# Patient Record
Sex: Female | Born: 1960 | Race: White | Hispanic: No | Smoking: Former smoker
Health system: Southern US, Community
[De-identification: ages and names within clinical notes are randomized; demographics above are authoritative.]

## PROBLEM LIST (undated history)

## (undated) DIAGNOSIS — Z8719 Personal history of other diseases of the digestive system: Secondary | ICD-10-CM

## (undated) DIAGNOSIS — K8591 Acute pancreatitis with uninfected necrosis, unspecified: Secondary | ICD-10-CM

## (undated) DIAGNOSIS — K859 Acute pancreatitis without necrosis or infection, unspecified: Secondary | ICD-10-CM

## (undated) DIAGNOSIS — K219 Gastro-esophageal reflux disease without esophagitis: Secondary | ICD-10-CM

## (undated) DIAGNOSIS — J45909 Unspecified asthma, uncomplicated: Secondary | ICD-10-CM

## (undated) DIAGNOSIS — R51 Headache: Secondary | ICD-10-CM

## (undated) DIAGNOSIS — J189 Pneumonia, unspecified organism: Secondary | ICD-10-CM

## (undated) DIAGNOSIS — R519 Headache, unspecified: Secondary | ICD-10-CM

## (undated) DIAGNOSIS — Q453 Other congenital malformations of pancreas and pancreatic duct: Secondary | ICD-10-CM

## (undated) DIAGNOSIS — Z9289 Personal history of other medical treatment: Secondary | ICD-10-CM

## (undated) DIAGNOSIS — Z86711 Personal history of pulmonary embolism: Secondary | ICD-10-CM

## (undated) DIAGNOSIS — F1011 Alcohol abuse, in remission: Secondary | ICD-10-CM

## (undated) DIAGNOSIS — I10 Essential (primary) hypertension: Secondary | ICD-10-CM

## (undated) DIAGNOSIS — I82409 Acute embolism and thrombosis of unspecified deep veins of unspecified lower extremity: Secondary | ICD-10-CM

## (undated) DIAGNOSIS — K769 Liver disease, unspecified: Secondary | ICD-10-CM

## (undated) HISTORY — PX: EXPLORATORY LAPAROTOMY: SUR591

## (undated) HISTORY — DX: Personal history of other diseases of the digestive system: Z87.19

## (undated) HISTORY — PX: IVC FILTER PLACEMENT (ARMC HX): HXRAD1551

## (undated) HISTORY — PX: COLONOSCOPY: SHX174

## (undated) HISTORY — DX: Alcohol abuse, in remission: F10.11

## (undated) HISTORY — DX: Unspecified asthma, uncomplicated: J45.909

## (undated) HISTORY — PX: LAPAROSCOPIC CHOLECYSTECTOMY: SUR755

## (undated) HISTORY — PX: CHOLECYSTECTOMY: SHX55

---

## 2008-11-11 ENCOUNTER — Inpatient Hospital Stay
Admission: EM | Admit: 2008-11-11 | Disposition: A | Discharge status: Home / Self Care | Payer: Self-pay | Source: Ambulatory Visit | Admitting: Internal Medicine

## 2009-10-13 ENCOUNTER — Inpatient Hospital Stay
Admission: EM | Admit: 2009-10-13 | Disposition: A | Discharge status: Home / Self Care | Payer: Self-pay | Source: Ambulatory Visit | Admitting: Internal Medicine

## 2009-12-14 ENCOUNTER — Emergency Department
Admission: EM | Admit: 2009-12-14 | Disposition: A | Discharge status: Home / Self Care | Payer: Self-pay | Source: Ambulatory Visit

## 2009-12-15 ENCOUNTER — Inpatient Hospital Stay
Admission: EM | Admit: 2009-12-15 | Disposition: A | Discharge status: Home / Self Care | Payer: Self-pay | Source: Ambulatory Visit | Admitting: Family Medicine

## 2010-02-25 ENCOUNTER — Inpatient Hospital Stay
Admission: EM | Admit: 2010-02-25 | Disposition: A | Discharge status: Home / Self Care | Payer: Self-pay | Source: Ambulatory Visit | Admitting: Internal Medicine

## 2010-04-10 HISTORY — PX: THORACENTESIS: SHX235

## 2010-06-22 ENCOUNTER — Ambulatory Visit (INDEPENDENT_AMBULATORY_CARE_PROVIDER_SITE_OTHER): Admit: 2010-06-22 | Disposition: A | Discharge status: Home / Self Care | Payer: Self-pay | Source: Ambulatory Visit

## 2010-07-12 ENCOUNTER — Inpatient Hospital Stay
Admission: EM | Admit: 2010-07-12 | Disposition: A | Discharge status: Home / Self Care | Payer: Self-pay | Source: Ambulatory Visit | Admitting: Internal Medicine

## 2010-07-22 ENCOUNTER — Ambulatory Visit
Admission: RE | Admit: 2010-07-22 | Disposition: A | Discharge status: Home / Self Care | Payer: Self-pay | Source: Ambulatory Visit

## 2010-07-31 ENCOUNTER — Inpatient Hospital Stay
Admission: EM | Admit: 2010-07-31 | Disposition: A | Discharge status: Home / Self Care | Payer: Self-pay | Source: Ambulatory Visit | Admitting: Internal Medicine

## 2010-08-19 ENCOUNTER — Inpatient Hospital Stay
Admission: EM | Admit: 2010-08-19 | Disposition: A | Discharge status: Home / Self Care | Payer: Self-pay | Source: Ambulatory Visit | Admitting: Internal Medicine

## 2010-08-28 ENCOUNTER — Inpatient Hospital Stay
Admission: EM | Admit: 2010-08-28 | Disposition: A | Discharge status: Home / Self Care | Payer: Self-pay | Source: Ambulatory Visit | Admitting: Internal Medicine

## 2010-09-28 ENCOUNTER — Emergency Department
Admission: EM | Admit: 2010-09-28 | Disposition: A | Discharge status: Home / Self Care | Payer: Self-pay | Source: Ambulatory Visit

## 2010-09-29 ENCOUNTER — Ambulatory Visit
Admission: RE | Admit: 2010-09-29 | Disposition: A | Discharge status: Home / Self Care | Payer: Self-pay | Source: Ambulatory Visit

## 2010-10-03 ENCOUNTER — Inpatient Hospital Stay
Admission: EM | Admit: 2010-10-03 | Disposition: A | Discharge status: Home / Self Care | Payer: Self-pay | Source: Ambulatory Visit | Admitting: Internal Medicine

## 2010-10-10 ENCOUNTER — Ambulatory Visit
Admission: RE | Admit: 2010-10-10 | Disposition: A | Discharge status: Home / Self Care | Payer: Self-pay | Source: Ambulatory Visit

## 2010-11-13 ENCOUNTER — Inpatient Hospital Stay
Admission: EM | Admit: 2010-11-13 | Disposition: A | Discharge status: Home / Self Care | Payer: Self-pay | Source: Ambulatory Visit | Admitting: Internal Medicine

## 2010-12-09 ENCOUNTER — Ambulatory Visit
Admission: RE | Admit: 2010-12-09 | Disposition: A | Discharge status: Home / Self Care | Payer: Self-pay | Source: Ambulatory Visit

## 2010-12-11 ENCOUNTER — Emergency Department
Admission: EM | Admit: 2010-12-11 | Disposition: A | Discharge status: Other Acute Inpatient Hospital | Payer: Self-pay | Source: Ambulatory Visit

## 2011-01-09 HISTORY — PX: PANCREATECTOMY: SHX1019

## 2011-01-21 DIAGNOSIS — Z8719 Personal history of other diseases of the digestive system: Secondary | ICD-10-CM | POA: Insufficient documentation

## 2011-02-14 ENCOUNTER — Ambulatory Visit
Admission: RE | Admit: 2011-02-14 | Disposition: A | Discharge status: Home / Self Care | Payer: Self-pay | Source: Ambulatory Visit

## 2011-03-11 ENCOUNTER — Ambulatory Visit
Admission: RE | Admit: 2011-03-11 | Disposition: A | Discharge status: Home / Self Care | Payer: Self-pay | Source: Ambulatory Visit

## 2011-03-18 ENCOUNTER — Emergency Department
Admission: EM | Admit: 2011-03-18 | Disposition: A | Discharge status: Home / Self Care | Payer: Self-pay | Source: Ambulatory Visit

## 2011-04-11 ENCOUNTER — Ambulatory Visit
Admission: RE | Admit: 2011-04-11 | Disposition: A | Discharge status: Home / Self Care | Payer: Self-pay | Source: Ambulatory Visit

## 2011-04-23 ENCOUNTER — Emergency Department
Admission: EM | Admit: 2011-04-23 | Disposition: A | Discharge status: Home / Self Care | Payer: Self-pay | Source: Ambulatory Visit

## 2011-05-12 ENCOUNTER — Ambulatory Visit
Admission: RE | Admit: 2011-05-12 | Disposition: A | Discharge status: Home / Self Care | Payer: Self-pay | Source: Ambulatory Visit

## 2011-06-09 ENCOUNTER — Ambulatory Visit
Admission: RE | Admit: 2011-06-09 | Disposition: A | Discharge status: Home / Self Care | Payer: Self-pay | Source: Ambulatory Visit

## 2011-07-10 ENCOUNTER — Ambulatory Visit
Admission: RE | Admit: 2011-07-10 | Disposition: A | Discharge status: Home / Self Care | Payer: Self-pay | Source: Ambulatory Visit

## 2011-09-05 ENCOUNTER — Emergency Department
Admission: EM | Admit: 2011-09-05 | Discharge status: Against Medical Advice | Payer: Self-pay | Source: Ambulatory Visit

## 2011-09-05 ENCOUNTER — Inpatient Hospital Stay
Admission: EM | Admit: 2011-09-05 | Disposition: A | Discharge status: Home / Self Care | Payer: Self-pay | Source: Ambulatory Visit | Admitting: Internal Medicine

## 2011-09-21 ENCOUNTER — Inpatient Hospital Stay
Admission: EM | Admit: 2011-09-21 | Disposition: A | Discharge status: Home Health | Payer: Self-pay | Source: Ambulatory Visit | Admitting: Internal Medicine

## 2011-10-02 ENCOUNTER — Ambulatory Visit
Admission: RE | Admit: 2011-10-02 | Disposition: A | Discharge status: Home / Self Care | Payer: Self-pay | Source: Ambulatory Visit

## 2011-10-06 ENCOUNTER — Ambulatory Visit
Admission: RE | Admit: 2011-10-06 | Disposition: A | Discharge status: Home / Self Care | Payer: Self-pay | Source: Ambulatory Visit

## 2011-10-08 ENCOUNTER — Inpatient Hospital Stay
Admission: EM | Admit: 2011-10-08 | Disposition: A | Discharge status: Home / Self Care | Payer: Self-pay | Source: Ambulatory Visit | Admitting: Internal Medicine

## 2012-04-12 LAB — VH I-STAT INR: i-STAT INR: 1.2 (ref 0.0–3.0)

## 2012-04-19 ENCOUNTER — Ambulatory Visit
Admission: RE | Admit: 2012-04-19 | Disposition: A | Discharge status: Home / Self Care | Payer: Self-pay | Source: Ambulatory Visit

## 2012-05-08 LAB — CBC AND DIFFERENTIAL
Basophils %: 0 % (ref 0.0–3.0)
Basophils %: 0.1 % (ref 0.0–3.0)
Basophils %: 0.1 % (ref 0.0–3.0)
Basophils %: 0.2 % (ref 0.0–3.0)
Basophils %: 0.2 % (ref 0.0–3.0)
Basophils Absolute: 0 10*3/uL (ref 0.0–0.3)
Basophils Absolute: 0 10*3/uL (ref 0.0–0.3)
Basophils Absolute: 0 10*3/uL (ref 0.0–0.3)
Basophils Absolute: 0 10*3/uL (ref 0.0–0.3)
Basophils Absolute: 0 10*3/uL (ref 0.0–0.3)
Eosinophils %: 0.2 % (ref 0.0–7.0)
Eosinophils %: 0.2 % (ref 0.0–7.0)
Eosinophils %: 0.4 % (ref 0.0–7.0)
Eosinophils %: 0.8 % (ref 0.0–7.0)
Eosinophils %: 10 % — ABNORMAL HIGH (ref 0.0–7.0)
Eosinophils Absolute: 0 10*3/uL (ref 0.0–0.8)
Eosinophils Absolute: 0 10*3/uL (ref 0.0–0.8)
Eosinophils Absolute: 0 10*3/uL (ref 0.0–0.8)
Eosinophils Absolute: 0.1 10*3/uL (ref 0.0–0.8)
Eosinophils Absolute: 1.8 10*3/uL — ABNORMAL HIGH (ref 0.0–0.8)
Hematocrit: 34.9 % — ABNORMAL LOW (ref 36.0–48.0)
Hematocrit: 38.5 % (ref 36.0–48.0)
Hematocrit: 42 % (ref 36.0–48.0)
Hematocrit: 44.4 % (ref 36.0–48.0)
Hematocrit: 46.9 % (ref 36.0–48.0)
Hemoglobin: 12.1 gm/dL (ref 12.0–16.0)
Hemoglobin: 12.9 gm/dL (ref 12.0–16.0)
Hemoglobin: 14.2 gm/dL (ref 12.0–16.0)
Hemoglobin: 15.5 gm/dL (ref 12.0–16.0)
Hemoglobin: 16.1 gm/dL — ABNORMAL HIGH (ref 12.0–16.0)
Lymphocytes Absolute: 0.3 10*3/uL — ABNORMAL LOW (ref 0.6–5.1)
Lymphocytes Absolute: 0.7 10*3/uL (ref 0.6–5.1)
Lymphocytes Absolute: 1.1 10*3/uL (ref 0.6–5.1)
Lymphocytes Absolute: 1.4 10*3/uL (ref 0.6–5.1)
Lymphocytes Absolute: 1.4 10*3/uL (ref 0.6–5.1)
Lymphocytes: 10.2 % — ABNORMAL LOW (ref 15.0–46.0)
Lymphocytes: 10.5 % — ABNORMAL LOW (ref 15.0–46.0)
Lymphocytes: 15.1 % (ref 15.0–46.0)
Lymphocytes: 2 % — ABNORMAL LOW (ref 15.0–46.0)
Lymphocytes: 9.9 % — ABNORMAL LOW (ref 15.0–46.0)
MCH: 34 pg (ref 28–35)
MCH: 34 pg (ref 28–35)
MCH: 34 pg (ref 28–35)
MCH: 35 pg (ref 28–35)
MCH: 35 pg (ref 28–35)
MCHC: 34 gm/dL (ref 32–36)
MCHC: 34 gm/dL (ref 32–36)
MCHC: 34 gm/dL (ref 32–36)
MCHC: 35 gm/dL (ref 32–36)
MCHC: 35 gm/dL (ref 32–36)
MCV: 100 fL (ref 80–100)
MCV: 100 fL (ref 80–100)
MCV: 100 fL (ref 80–100)
MCV: 101 fL — ABNORMAL HIGH (ref 80–100)
MCV: 101 fL — ABNORMAL HIGH (ref 80–100)
MPV: 6.3 fL (ref 6.0–10.0)
MPV: 6.6 fL (ref 6.0–10.0)
MPV: 6.7 fL (ref 6.0–10.0)
MPV: 6.8 fL (ref 6.0–10.0)
MPV: 6.8 fL (ref 6.0–10.0)
Monocytes Absolute: 0.1 10*3/uL (ref 0.1–1.7)
Monocytes Absolute: 0.4 10*3/uL (ref 0.1–1.7)
Monocytes Absolute: 0.8 10*3/uL (ref 0.1–1.7)
Monocytes Absolute: 0.9 10*3/uL (ref 0.1–1.7)
Monocytes Absolute: 1 10*3/uL (ref 0.1–1.7)
Monocytes: 11.7 % (ref 3.0–15.0)
Monocytes: 3 % (ref 3.0–15.0)
Monocytes: 6.4 % (ref 3.0–15.0)
Monocytes: 6.4 % (ref 3.0–15.0)
Monocytes: 7.1 % (ref 3.0–15.0)
Neutrophils %: 72.4 % (ref 42.0–78.0)
Neutrophils %: 82.6 % — ABNORMAL HIGH (ref 42.0–78.0)
Neutrophils %: 82.6 % — ABNORMAL HIGH (ref 42.0–78.0)
Neutrophils %: 82.9 % — ABNORMAL HIGH (ref 42.0–78.0)
Neutrophils %: 85 % — ABNORMAL HIGH (ref 42.0–78.0)
Neutrophils Absolute: 11.2 10*3/uL — ABNORMAL HIGH (ref 1.7–8.6)
Neutrophils Absolute: 11.3 10*3/uL — ABNORMAL HIGH (ref 1.7–8.6)
Neutrophils Absolute: 12.4 10*3/uL — ABNORMAL HIGH (ref 1.7–8.6)
Neutrophils Absolute: 5.2 10*3/uL (ref 1.7–8.6)
Neutrophils Absolute: 5.4 10*3/uL (ref 1.7–8.6)
PLT CT: 208 10*3/uL (ref 130–440)
PLT CT: 213 10*3/uL (ref 130–440)
PLT CT: 216 10*3/uL (ref 130–440)
PLT CT: 306 10*3/uL (ref 130–440)
PLT CT: 306 10*3/uL (ref 130–440)
RBC: 3.48 10*6/uL — ABNORMAL LOW (ref 3.80–5.00)
RBC: 3.82 10*6/uL (ref 3.80–5.00)
RBC: 4.18 10*6/uL (ref 3.80–5.00)
RBC: 4.44 10*6/uL (ref 3.80–5.00)
RBC: 4.7 10*6/uL (ref 3.80–5.00)
RDW: 10.9 % — ABNORMAL LOW (ref 11.0–14.0)
RDW: 11 % (ref 11.0–14.0)
RDW: 11.1 % (ref 11.0–14.0)
RDW: 11.1 % (ref 11.0–14.0)
RDW: 11.2 % (ref 11.0–14.0)
WBC: 13.6 10*3/uL — ABNORMAL HIGH (ref 4.0–11.0)
WBC: 13.7 10*3/uL — ABNORMAL HIGH (ref 4.0–11.0)
WBC: 14.6 10*3/uL — ABNORMAL HIGH (ref 4.0–11.0)
WBC: 6.5 10*3/uL (ref 4.0–11.0)
WBC: 7.2 10*3/uL (ref 4.0–11.0)

## 2012-05-08 LAB — HEPATIC FUNCTION PANEL
ALT: 16 U/L (ref 0–55)
ALT: 21 U/L (ref 0–55)
ALT: 22 U/L (ref 0–55)
AST (SGOT): 17 U/L (ref 10–42)
AST (SGOT): 24 U/L (ref 10–42)
AST (SGOT): 26 U/L (ref 10–42)
Albumin/Globulin Ratio: 0.78 Ratio (ref 0.70–1.50)
Albumin/Globulin Ratio: 0.91 Ratio (ref 0.70–1.50)
Albumin/Globulin Ratio: 0.95 Ratio (ref 0.70–1.50)
Albumin: 2.9 gm/dL — ABNORMAL LOW (ref 3.5–5.0)
Albumin: 4 gm/dL (ref 3.5–5.0)
Albumin: 4.2 gm/dL (ref 3.5–5.0)
Alkaline Phosphatase: 121 U/L (ref 40–145)
Alkaline Phosphatase: 177 U/L — ABNORMAL HIGH (ref 40–145)
Alkaline Phosphatase: 191 U/L — ABNORMAL HIGH (ref 40–145)
Bilirubin Direct: 0.2 mg/dL (ref 0.0–0.3)
Bilirubin Direct: 0.3 mg/dL (ref 0.0–0.3)
Bilirubin Direct: 0.3 mg/dL (ref 0.0–0.3)
Bilirubin, Total: 0.3 mg/dL (ref 0.1–1.2)
Bilirubin, Total: 0.9 mg/dL (ref 0.1–1.2)
Bilirubin, Total: 0.9 mg/dL (ref 0.1–1.2)
Globulin: 3.7 gm/dL (ref 2.0–4.0)
Globulin: 4.4 gm/dL — ABNORMAL HIGH (ref 2.0–4.0)
Globulin: 4.4 gm/dL — ABNORMAL HIGH (ref 2.0–4.0)
Protein, Total: 6.6 gm/dL (ref 6.0–8.3)
Protein, Total: 8.4 gm/dL — ABNORMAL HIGH (ref 6.0–8.3)
Protein, Total: 8.6 gm/dL — ABNORMAL HIGH (ref 6.0–8.3)

## 2012-05-08 LAB — BASIC METABOLIC PANEL
Anion Gap: 14.2 mMol/L (ref 7.0–18.0)
Anion Gap: 14.5 mMol/L (ref 7.0–18.0)
Anion Gap: 18 mMol/L (ref 7.0–18.0)
BUN / Creatinine Ratio: 10.7 Ratio (ref 10.0–30.0)
BUN / Creatinine Ratio: 13.1 Ratio (ref 10.0–30.0)
BUN / Creatinine Ratio: 9.5 Ratio — ABNORMAL LOW (ref 10.0–30.0)
BUN: 11 mg/dL (ref 7–22)
BUN: 6 mg/dL — ABNORMAL LOW (ref 7–22)
BUN: 8 mg/dL (ref 7–22)
CO2: 19.3 mMol/L — ABNORMAL LOW (ref 20.0–30.0)
CO2: 20.1 mMol/L (ref 20.0–30.0)
CO2: 20.6 mMol/L (ref 20.0–30.0)
Calcium: 10.6 mg/dL — ABNORMAL HIGH (ref 8.5–10.5)
Calcium: 9.7 mg/dL (ref 8.5–10.5)
Calcium: 9.8 mg/dL (ref 8.5–10.5)
Chloride: 103 mMol/L (ref 98–110)
Chloride: 104 mMol/L (ref 98–110)
Chloride: 108 mMol/L (ref 98–110)
Creatinine: 0.63 mg/dL (ref 0.60–1.20)
Creatinine: 0.75 mg/dL (ref 0.60–1.20)
Creatinine: 0.84 mg/dL (ref 0.60–1.20)
EGFR: >60 mL/min/{1.73_m2}
EGFR: >60 mL/min/{1.73_m2}
EGFR: >60 mL/min/{1.73_m2}
Glucose: 110 mg/dL — ABNORMAL HIGH (ref 70–99)
Glucose: 111 mg/dL — ABNORMAL HIGH (ref 70–99)
Glucose: 117 mg/dL — ABNORMAL HIGH (ref 70–99)
Osmolality Calc: 268 mOsm/kg — ABNORMAL LOW (ref 275–300)
Osmolality Calc: 274 mOsm/kg — ABNORMAL LOW (ref 275–300)
Osmolality Calc: 277 mOsm/kg (ref 275–300)
Potassium: 3.5 mMol/L (ref 3.5–5.3)
Potassium: 3.6 mMol/L (ref 3.5–5.3)
Potassium: 3.6 mMol/L (ref 3.5–5.3)
Sodium: 134 mMol/L — ABNORMAL LOW (ref 136–147)
Sodium: 138 mMol/L (ref 136–147)
Sodium: 139 mMol/L (ref 136–147)

## 2012-05-08 LAB — COMPREHENSIVE METABOLIC PANEL
ALT: 19 U/L (ref 0–55)
AST (SGOT): 24 U/L (ref 10–42)
Albumin/Globulin Ratio: 0.74 Ratio (ref 0.70–1.50)
Albumin: 2.8 gm/dL — ABNORMAL LOW (ref 3.5–5.0)
Alkaline Phosphatase: 136 U/L (ref 40–145)
Anion Gap: 14.4 mMol/L (ref 7.0–18.0)
BUN / Creatinine Ratio: 10.8 Ratio (ref 10.0–30.0)
BUN: 8 mg/dL (ref 7–22)
Bilirubin, Total: 0.4 mg/dL (ref 0.1–1.2)
CO2: 19.1 mMol/L — ABNORMAL LOW (ref 20.0–30.0)
Calcium: 9.5 mg/dL (ref 8.5–10.5)
Chloride: 106 mMol/L (ref 98–110)
Creatinine: 0.74 mg/dL (ref 0.60–1.20)
EGFR: >60 mL/min/{1.73_m2}
Globulin: 3.8 gm/dL (ref 2.0–4.0)
Glucose: 135 mg/dL — ABNORMAL HIGH (ref 70–99)
Osmolality Calc: 272 mOsm/kg — ABNORMAL LOW (ref 275–300)
Potassium: 3.5 mMol/L (ref 3.5–5.3)
Protein, Total: 6.6 gm/dL (ref 6.0–8.3)
Sodium: 136 mMol/L (ref 136–147)

## 2012-05-08 LAB — URINALYSIS WITH CULTURE REFLEX
Bilirubin, UA: NEGATIVE mg/dL
Glucose, UA: NEGATIVE mg/dL
Ketones UA: NEGATIVE mg/dL
Leukocyte Esterase, UA: NEGATIVE Leu/uL
Nitrite, UA: NEGATIVE
RBC, UA: 3 /hpf (ref 0–5)
Squam Epithel, UA: 1 /hpf (ref 0–2)
Urine Protein: 30 mg/dL — AB
Urine Specific Gravity: 1.05 — ABNORMAL HIGH (ref 1.001–1.040)
Urobilinogen, UA: NORMAL mg/dL
WBC, UA: 3 /hpf (ref 0–4)
pH, Urine: 6 pH (ref 5.0–8.0)

## 2012-05-08 LAB — I-STAT CHEM 8 CARTRIDGE
Anion Gap I-Stat: 14 (ref 7–16)
BUN I-Stat: 7 mg/dL (ref 6–20)
Calcium Ionized I-Stat: 4.9 mg/dL (ref 4.35–5.10)
Chloride I-Stat: 106 mMol/L (ref 98–112)
Creatinine I-Stat: 0.8 mg/dL (ref 0.60–1.10)
EGFR: >60 mL/min/{1.73_m2}
Glucose I-Stat: 108 mg/dL — ABNORMAL HIGH (ref 70–99)
Hematocrit I-Stat: 47 % (ref 36.0–48.0)
Hemoglobin I-Stat: 16 gm/dL (ref 12.0–16.0)
Potassium I-Stat: 3.8 mMol/L (ref 3.5–5.3)
Sodium I-Stat: 139 mMol/L (ref 135–145)
TCO2 I-Stat: 24 mMol/L (ref 24–29)

## 2012-05-08 LAB — URINALYSIS
Bilirubin, UA: NEGATIVE mg/dL
Blood, UA: NEGATIVE mg/dL
Glucose, UA: NEGATIVE mg/dL
Ketones UA: NEGATIVE mg/dL
Leukocyte Esterase, UA: NEGATIVE Leu/uL
Nitrite, UA: NEGATIVE
RBC, UA: 3 /hpf (ref 0–5)
Squam Epithel, UA: 2 /hpf (ref 0–2)
Urine Protein: 100 mg/dL — AB
Urine Specific Gravity: 1.016 (ref 1.001–1.040)
Urobilinogen, UA: NORMAL mg/dL
WBC, UA: 1 /hpf (ref 0–4)
pH, Urine: 6 pH (ref 5.0–8.0)

## 2012-05-08 LAB — LIPASE
Lipase: 1094 U/L — ABNORMAL HIGH (ref 8–78)
Lipase: 392 U/L — ABNORMAL HIGH (ref 8–78)
Lipase: 396 U/L — ABNORMAL HIGH (ref 8–78)
Lipase: 434 U/L — ABNORMAL HIGH (ref 8–78)
Lipase: 751 U/L — ABNORMAL HIGH (ref 8–78)

## 2012-05-08 LAB — PTH,INTACT(WITH CA,CREAT,PHOS)
Calcium: 9.6 mg/dL (ref 8.5–10.5)
Creatinine: 0.64 mg/dL (ref 0.60–1.20)
EGFR: >60 mL/min/{1.73_m2}
PTH Intact: 50 pg/mL (ref 15.0–68.3)
Phosphorus: 2.8 mg/dL (ref 2.3–4.7)

## 2012-05-08 LAB — AMYLASE: Amylase: 142 U/L — ABNORMAL HIGH (ref 30–135)

## 2012-05-08 LAB — CALCIUM, IONIZED: Calcium, Ionized: 5.11 mg/dL — ABNORMAL HIGH (ref 4.35–5.10)

## 2012-05-09 LAB — CBC AND DIFFERENTIAL
Basophils %: 0.1 % (ref 0.0–3.0)
Basophils %: 0.1 % (ref 0.0–3.0)
Basophils %: 0.2 % (ref 0.0–3.0)
Basophils %: 0.2 % (ref 0.0–3.0)
Basophils %: 0.2 % (ref 0.0–3.0)
Basophils %: 0.2 % (ref 0.0–3.0)
Basophils %: 0.3 % (ref 0.0–3.0)
Basophils %: 0.4 % (ref 0.0–3.0)
Basophils %: 0.4 % (ref 0.0–3.0)
Basophils %: 0.4 % (ref 0.0–3.0)
Basophils %: 0.5 % (ref 0.0–3.0)
Basophils %: 0.5 % (ref 0.0–3.0)
Basophils %: 0.6 % (ref 0.0–3.0)
Basophils %: 0.6 % (ref 0.0–3.0)
Basophils %: 0.7 % (ref 0.0–3.0)
Basophils %: 0.9 % (ref 0.0–3.0)
Basophils Absolute: 0 10*3/uL (ref 0.0–0.3)
Basophils Absolute: 0 10*3/uL (ref 0.0–0.3)
Basophils Absolute: 0 10*3/uL (ref 0.0–0.3)
Basophils Absolute: 0 10*3/uL (ref 0.0–0.3)
Basophils Absolute: 0 10*3/uL (ref 0.0–0.3)
Basophils Absolute: 0 10*3/uL (ref 0.0–0.3)
Basophils Absolute: 0 10*3/uL (ref 0.0–0.3)
Basophils Absolute: 0 10*3/uL (ref 0.0–0.3)
Basophils Absolute: 0 10*3/uL (ref 0.0–0.3)
Basophils Absolute: 0 10*3/uL (ref 0.0–0.3)
Basophils Absolute: 0 10*3/uL (ref 0.0–0.3)
Basophils Absolute: 0 10*3/uL (ref 0.0–0.3)
Basophils Absolute: 0 10*3/uL (ref 0.0–0.3)
Basophils Absolute: 0 10*3/uL (ref 0.0–0.3)
Basophils Absolute: 0.1 10*3/uL (ref 0.0–0.3)
Basophils Absolute: 0.1 10*3/uL (ref 0.0–0.3)
Eosinophils %: 0.7 % (ref 0.0–7.0)
Eosinophils %: 0.7 % (ref 0.0–7.0)
Eosinophils %: 0.8 % (ref 0.0–7.0)
Eosinophils %: 1 % (ref 0.0–7.0)
Eosinophils %: 1.1 % (ref 0.0–7.0)
Eosinophils %: 1.4 % (ref 0.0–7.0)
Eosinophils %: 1.6 % (ref 0.0–7.0)
Eosinophils %: 1.8 % (ref 0.0–7.0)
Eosinophils %: 1.8 % (ref 0.0–7.0)
Eosinophils %: 2.5 % (ref 0.0–7.0)
Eosinophils %: 2.8 % (ref 0.0–7.0)
Eosinophils %: 2.9 % (ref 0.0–7.0)
Eosinophils %: 2.9 % (ref 0.0–7.0)
Eosinophils %: 2.9 % (ref 0.0–7.0)
Eosinophils %: 3.2 % (ref 0.0–7.0)
Eosinophils %: 3.2 % (ref 0.0–7.0)
Eosinophils Absolute: 0 10*3/uL (ref 0.0–0.8)
Eosinophils Absolute: 0.1 10*3/uL (ref 0.0–0.8)
Eosinophils Absolute: 0.1 10*3/uL (ref 0.0–0.8)
Eosinophils Absolute: 0.1 10*3/uL (ref 0.0–0.8)
Eosinophils Absolute: 0.1 10*3/uL (ref 0.0–0.8)
Eosinophils Absolute: 0.1 10*3/uL (ref 0.0–0.8)
Eosinophils Absolute: 0.1 10*3/uL (ref 0.0–0.8)
Eosinophils Absolute: 0.1 10*3/uL (ref 0.0–0.8)
Eosinophils Absolute: 0.1 10*3/uL (ref 0.0–0.8)
Eosinophils Absolute: 0.1 10*3/uL (ref 0.0–0.8)
Eosinophils Absolute: 0.2 10*3/uL (ref 0.0–0.8)
Eosinophils Absolute: 0.2 10*3/uL (ref 0.0–0.8)
Eosinophils Absolute: 0.2 10*3/uL (ref 0.0–0.8)
Eosinophils Absolute: 0.2 10*3/uL (ref 0.0–0.8)
Eosinophils Absolute: 0.2 10*3/uL (ref 0.0–0.8)
Eosinophils Absolute: 0.4 10*3/uL (ref 0.0–0.8)
Hematocrit: 27.2 % — ABNORMAL LOW (ref 36.0–48.0)
Hematocrit: 28.7 % — ABNORMAL LOW (ref 36.0–48.0)
Hematocrit: 29.3 % — ABNORMAL LOW (ref 36.0–48.0)
Hematocrit: 29.5 % — ABNORMAL LOW (ref 36.0–48.0)
Hematocrit: 31.7 % — ABNORMAL LOW (ref 36.0–48.0)
Hematocrit: 31.7 % — ABNORMAL LOW (ref 36.0–48.0)
Hematocrit: 32.5 % — ABNORMAL LOW (ref 36.0–48.0)
Hematocrit: 32.6 % — ABNORMAL LOW (ref 36.0–48.0)
Hematocrit: 32.9 % — ABNORMAL LOW (ref 36.0–48.0)
Hematocrit: 34.6 % — ABNORMAL LOW (ref 36.0–48.0)
Hematocrit: 35.2 % — ABNORMAL LOW (ref 36.0–48.0)
Hematocrit: 36 % (ref 36.0–48.0)
Hematocrit: 36.9 % (ref 36.0–48.0)
Hematocrit: 37.3 % (ref 36.0–48.0)
Hematocrit: 39.1 % (ref 36.0–48.0)
Hematocrit: 43.7 % (ref 36.0–48.0)
Hemoglobin: 10.4 gm/dL — ABNORMAL LOW (ref 12.0–16.0)
Hemoglobin: 10.6 gm/dL — ABNORMAL LOW (ref 12.0–16.0)
Hemoglobin: 10.8 gm/dL — ABNORMAL LOW (ref 12.0–16.0)
Hemoglobin: 10.9 gm/dL — ABNORMAL LOW (ref 12.0–16.0)
Hemoglobin: 11.4 gm/dL — ABNORMAL LOW (ref 12.0–16.0)
Hemoglobin: 11.8 gm/dL — ABNORMAL LOW (ref 12.0–16.0)
Hemoglobin: 11.9 gm/dL — ABNORMAL LOW (ref 12.0–16.0)
Hemoglobin: 12.4 gm/dL (ref 12.0–16.0)
Hemoglobin: 12.4 gm/dL (ref 12.0–16.0)
Hemoglobin: 12.8 gm/dL (ref 12.0–16.0)
Hemoglobin: 13 gm/dL (ref 12.0–16.0)
Hemoglobin: 14.9 gm/dL (ref 12.0–16.0)
Hemoglobin: 9.3 gm/dL — ABNORMAL LOW (ref 12.0–16.0)
Hemoglobin: 9.4 gm/dL — ABNORMAL LOW (ref 12.0–16.0)
Hemoglobin: 9.6 gm/dL — ABNORMAL LOW (ref 12.0–16.0)
Hemoglobin: 9.9 gm/dL — ABNORMAL LOW (ref 12.0–16.0)
Lymphocytes Absolute: 0.4 10*3/uL — ABNORMAL LOW (ref 0.6–5.1)
Lymphocytes Absolute: 0.7 10*3/uL (ref 0.6–5.1)
Lymphocytes Absolute: 0.9 10*3/uL (ref 0.6–5.1)
Lymphocytes Absolute: 0.9 10*3/uL (ref 0.6–5.1)
Lymphocytes Absolute: 1.1 10*3/uL (ref 0.6–5.1)
Lymphocytes Absolute: 1.2 10*3/uL (ref 0.6–5.1)
Lymphocytes Absolute: 1.3 10*3/uL (ref 0.6–5.1)
Lymphocytes Absolute: 1.4 10*3/uL (ref 0.6–5.1)
Lymphocytes Absolute: 1.5 10*3/uL (ref 0.6–5.1)
Lymphocytes Absolute: 1.6 10*3/uL (ref 0.6–5.1)
Lymphocytes Absolute: 1.8 10*3/uL (ref 0.6–5.1)
Lymphocytes Absolute: 1.8 10*3/uL (ref 0.6–5.1)
Lymphocytes Absolute: 2 10*3/uL (ref 0.6–5.1)
Lymphocytes Absolute: 2 10*3/uL (ref 0.6–5.1)
Lymphocytes Absolute: 2.1 10*3/uL (ref 0.6–5.1)
Lymphocytes Absolute: 2.7 10*3/uL (ref 0.6–5.1)
Lymphocytes: 12.1 % — ABNORMAL LOW (ref 15.0–46.0)
Lymphocytes: 12.5 % — ABNORMAL LOW (ref 15.0–46.0)
Lymphocytes: 12.6 % — ABNORMAL LOW (ref 15.0–46.0)
Lymphocytes: 13.5 % — ABNORMAL LOW (ref 15.0–46.0)
Lymphocytes: 13.9 % — ABNORMAL LOW (ref 15.0–46.0)
Lymphocytes: 14 % — ABNORMAL LOW (ref 15.0–46.0)
Lymphocytes: 17.3 % (ref 15.0–46.0)
Lymphocytes: 18 % (ref 15.0–46.0)
Lymphocytes: 20.6 % (ref 15.0–46.0)
Lymphocytes: 21.6 % (ref 15.0–46.0)
Lymphocytes: 25 % (ref 15.0–46.0)
Lymphocytes: 26.8 % (ref 15.0–46.0)
Lymphocytes: 29.6 % (ref 15.0–46.0)
Lymphocytes: 30.8 % (ref 15.0–46.0)
Lymphocytes: 31.2 % (ref 15.0–46.0)
Lymphocytes: 4.4 % — ABNORMAL LOW (ref 15.0–46.0)
MCH: 33 pg (ref 28–35)
MCH: 33 pg (ref 28–35)
MCH: 33 pg (ref 28–35)
MCH: 33 pg (ref 28–35)
MCH: 33 pg (ref 28–35)
MCH: 33 pg (ref 28–35)
MCH: 33 pg (ref 28–35)
MCH: 33 pg (ref 28–35)
MCH: 33 pg (ref 28–35)
MCH: 34 pg (ref 28–35)
MCH: 34 pg (ref 28–35)
MCH: 34 pg (ref 28–35)
MCH: 34 pg (ref 28–35)
MCH: 34 pg (ref 28–35)
MCH: 34 pg (ref 28–35)
MCH: 35 pg (ref 28–35)
MCHC: 32 gm/dL (ref 32–36)
MCHC: 33 gm/dL (ref 32–36)
MCHC: 33 gm/dL (ref 32–36)
MCHC: 33 gm/dL (ref 32–36)
MCHC: 33 gm/dL (ref 32–36)
MCHC: 33 gm/dL (ref 32–36)
MCHC: 34 gm/dL (ref 32–36)
MCHC: 34 gm/dL (ref 32–36)
MCHC: 34 gm/dL (ref 32–36)
MCHC: 34 gm/dL (ref 32–36)
MCHC: 34 gm/dL (ref 32–36)
MCHC: 34 gm/dL (ref 32–36)
MCHC: 34 gm/dL (ref 32–36)
MCHC: 34 gm/dL (ref 32–36)
MCHC: 34 gm/dL (ref 32–36)
MCHC: 35 gm/dL (ref 32–36)
MCV: 100 fL (ref 80–100)
MCV: 100 fL (ref 80–100)
MCV: 100 fL (ref 80–100)
MCV: 100 fL (ref 80–100)
MCV: 101 fL — ABNORMAL HIGH (ref 80–100)
MCV: 102 fL — ABNORMAL HIGH (ref 80–100)
MCV: 103 fL — ABNORMAL HIGH (ref 80–100)
MCV: 95 fL (ref 80–100)
MCV: 97 fL (ref 80–100)
MCV: 97 fL (ref 80–100)
MCV: 98 fL (ref 80–100)
MCV: 98 fL (ref 80–100)
MCV: 99 fL (ref 80–100)
MCV: 99 fL (ref 80–100)
MCV: 99 fL (ref 80–100)
MCV: 99 fL (ref 80–100)
MPV: 5.7 fL — ABNORMAL LOW (ref 6.0–10.0)
MPV: 5.9 fL — ABNORMAL LOW (ref 6.0–10.0)
MPV: 5.9 fL — ABNORMAL LOW (ref 6.0–10.0)
MPV: 6 fL (ref 6.0–10.0)
MPV: 6 fL (ref 6.0–10.0)
MPV: 6 fL (ref 6.0–10.0)
MPV: 6.2 fL (ref 6.0–10.0)
MPV: 6.2 fL (ref 6.0–10.0)
MPV: 6.3 fL (ref 6.0–10.0)
MPV: 6.4 fL (ref 6.0–10.0)
MPV: 6.6 fL (ref 6.0–10.0)
MPV: 6.6 fL (ref 6.0–10.0)
MPV: 6.6 fL (ref 6.0–10.0)
MPV: 6.7 fL (ref 6.0–10.0)
MPV: 6.8 fL (ref 6.0–10.0)
MPV: 6.9 fL (ref 6.0–10.0)
Monocytes Absolute: 0.3 10*3/uL (ref 0.1–1.7)
Monocytes Absolute: 0.3 10*3/uL (ref 0.1–1.7)
Monocytes Absolute: 0.4 10*3/uL (ref 0.1–1.7)
Monocytes Absolute: 0.5 10*3/uL (ref 0.1–1.7)
Monocytes Absolute: 0.5 10*3/uL (ref 0.1–1.7)
Monocytes Absolute: 0.5 10*3/uL (ref 0.1–1.7)
Monocytes Absolute: 0.6 10*3/uL (ref 0.1–1.7)
Monocytes Absolute: 0.6 10*3/uL (ref 0.1–1.7)
Monocytes Absolute: 0.7 10*3/uL (ref 0.1–1.7)
Monocytes Absolute: 0.8 10*3/uL (ref 0.1–1.7)
Monocytes Absolute: 0.9 10*3/uL (ref 0.1–1.7)
Monocytes Absolute: 0.9 10*3/uL (ref 0.1–1.7)
Monocytes Absolute: 1.1 10*3/uL (ref 0.1–1.7)
Monocytes Absolute: 1.1 10*3/uL (ref 0.1–1.7)
Monocytes Absolute: 1.2 10*3/uL (ref 0.1–1.7)
Monocytes Absolute: 1.2 10*3/uL (ref 0.1–1.7)
Monocytes: 10.5 % (ref 3.0–15.0)
Monocytes: 10.7 % (ref 3.0–15.0)
Monocytes: 10.7 % (ref 3.0–15.0)
Monocytes: 10.7 % (ref 3.0–15.0)
Monocytes: 12.9 % (ref 3.0–15.0)
Monocytes: 13.4 % (ref 3.0–15.0)
Monocytes: 3.7 % (ref 3.0–15.0)
Monocytes: 5.4 % (ref 3.0–15.0)
Monocytes: 6.2 % (ref 3.0–15.0)
Monocytes: 6.2 % (ref 3.0–15.0)
Monocytes: 7.8 % (ref 3.0–15.0)
Monocytes: 8.1 % (ref 3.0–15.0)
Monocytes: 8.5 % (ref 3.0–15.0)
Monocytes: 9.2 % (ref 3.0–15.0)
Monocytes: 9.4 % (ref 3.0–15.0)
Monocytes: 9.7 % (ref 3.0–15.0)
Neutrophils %: 55.6 % (ref 42.0–78.0)
Neutrophils %: 55.9 % (ref 42.0–78.0)
Neutrophils %: 56.2 % (ref 42.0–78.0)
Neutrophils %: 58.5 % (ref 42.0–78.0)
Neutrophils %: 59.6 % (ref 42.0–78.0)
Neutrophils %: 63.5 % (ref 42.0–78.0)
Neutrophils %: 67.1 % (ref 42.0–78.0)
Neutrophils %: 69.4 % (ref 42.0–78.0)
Neutrophils %: 72 % (ref 42.0–78.0)
Neutrophils %: 74.4 % (ref 42.0–78.0)
Neutrophils %: 75.9 % (ref 42.0–78.0)
Neutrophils %: 77.2 % (ref 42.0–78.0)
Neutrophils %: 78.7 % — ABNORMAL HIGH (ref 42.0–78.0)
Neutrophils %: 79.1 % — ABNORMAL HIGH (ref 42.0–78.0)
Neutrophils %: 81.2 % — ABNORMAL HIGH (ref 42.0–78.0)
Neutrophils %: 91.1 % — ABNORMAL HIGH (ref 42.0–78.0)
Neutrophils Absolute: 11 10*3/uL — ABNORMAL HIGH (ref 1.7–8.6)
Neutrophils Absolute: 11.1 10*3/uL — ABNORMAL HIGH (ref 1.7–8.6)
Neutrophils Absolute: 2.6 10*3/uL (ref 1.7–8.6)
Neutrophils Absolute: 2.9 10*3/uL (ref 1.7–8.6)
Neutrophils Absolute: 3.3 10*3/uL (ref 1.7–8.6)
Neutrophils Absolute: 3.3 10*3/uL (ref 1.7–8.6)
Neutrophils Absolute: 3.8 10*3/uL (ref 1.7–8.6)
Neutrophils Absolute: 3.9 10*3/uL (ref 1.7–8.6)
Neutrophils Absolute: 4.5 10*3/uL (ref 1.7–8.6)
Neutrophils Absolute: 5.2 10*3/uL (ref 1.7–8.6)
Neutrophils Absolute: 5.2 10*3/uL (ref 1.7–8.6)
Neutrophils Absolute: 5.8 10*3/uL (ref 1.7–8.6)
Neutrophils Absolute: 7 10*3/uL (ref 1.7–8.6)
Neutrophils Absolute: 8.1 10*3/uL (ref 1.7–8.6)
Neutrophils Absolute: 8.3 10*3/uL (ref 1.7–8.6)
Neutrophils Absolute: 8.6 10*3/uL (ref 1.7–8.6)
PLT CT: 242 10*3/uL (ref 130–440)
PLT CT: 285 10*3/uL (ref 130–440)
PLT CT: 326 10*3/uL (ref 130–440)
PLT CT: 339 10*3/uL (ref 130–440)
PLT CT: 341 10*3/uL (ref 130–440)
PLT CT: 349 10*3/uL (ref 130–440)
PLT CT: 365 10*3/uL (ref 130–440)
PLT CT: 401 10*3/uL (ref 130–440)
PLT CT: 405 10*3/uL (ref 130–440)
PLT CT: 413 10*3/uL (ref 130–440)
PLT CT: 483 10*3/uL — ABNORMAL HIGH (ref 130–440)
PLT CT: 500 10*3/uL — ABNORMAL HIGH (ref 130–440)
PLT CT: 503 10*3/uL — ABNORMAL HIGH (ref 130–440)
PLT CT: 503 10*3/uL — ABNORMAL HIGH (ref 130–440)
PLT CT: 605 10*3/uL — ABNORMAL HIGH (ref 130–440)
PLT CT: 865 10*3/uL — ABNORMAL HIGH (ref 130–440)
RBC: 2.75 10*6/uL — ABNORMAL LOW (ref 3.80–5.00)
RBC: 2.88 10*6/uL — ABNORMAL LOW (ref 3.80–5.00)
RBC: 2.92 10*6/uL — ABNORMAL LOW (ref 3.80–5.00)
RBC: 3 10*6/uL — ABNORMAL LOW (ref 3.80–5.00)
RBC: 3.14 10*6/uL — ABNORMAL LOW (ref 3.80–5.00)
RBC: 3.19 10*6/uL — ABNORMAL LOW (ref 3.80–5.00)
RBC: 3.19 10*6/uL — ABNORMAL LOW (ref 3.80–5.00)
RBC: 3.21 10*6/uL — ABNORMAL LOW (ref 3.80–5.00)
RBC: 3.25 10*6/uL — ABNORMAL LOW (ref 3.80–5.00)
RBC: 3.47 10*6/uL — ABNORMAL LOW (ref 3.80–5.00)
RBC: 3.62 10*6/uL — ABNORMAL LOW (ref 3.80–5.00)
RBC: 3.65 10*6/uL — ABNORMAL LOW (ref 3.80–5.00)
RBC: 3.81 10*6/uL (ref 3.80–5.00)
RBC: 3.94 10*6/uL (ref 3.80–5.00)
RBC: 3.99 10*6/uL (ref 3.80–5.00)
RBC: 4.42 10*6/uL (ref 3.80–5.00)
RDW: 10.9 % — ABNORMAL LOW (ref 11.0–14.0)
RDW: 10.9 % — ABNORMAL LOW (ref 11.0–14.0)
RDW: 11.4 % (ref 11.0–14.0)
RDW: 12.2 % (ref 11.0–14.0)
RDW: 12.2 % (ref 11.0–14.0)
RDW: 12.2 % (ref 11.0–14.0)
RDW: 12.3 % (ref 11.0–14.0)
RDW: 12.3 % (ref 11.0–14.0)
RDW: 12.4 % (ref 11.0–14.0)
RDW: 12.5 % (ref 11.0–14.0)
RDW: 12.6 % (ref 11.0–14.0)
RDW: 12.6 % (ref 11.0–14.0)
RDW: 12.8 % (ref 11.0–14.0)
RDW: 13 % (ref 11.0–14.0)
RDW: 13.4 % (ref 11.0–14.0)
RDW: 14.3 % — ABNORMAL HIGH (ref 11.0–14.0)
WBC: 10.9 10*3/uL (ref 4.0–11.0)
WBC: 12.8 10*3/uL — ABNORMAL HIGH (ref 4.0–11.0)
WBC: 13.9 10*3/uL — ABNORMAL HIGH (ref 4.0–11.0)
WBC: 14.6 10*3/uL — ABNORMAL HIGH (ref 4.0–11.0)
WBC: 4.7 10*3/uL (ref 4.0–11.0)
WBC: 4.7 10*3/uL (ref 4.0–11.0)
WBC: 4.9 10*3/uL (ref 4.0–11.0)
WBC: 5.6 10*3/uL (ref 4.0–11.0)
WBC: 5.9 10*3/uL (ref 4.0–11.0)
WBC: 6.5 10*3/uL (ref 4.0–11.0)
WBC: 6.8 10*3/uL (ref 4.0–11.0)
WBC: 7.2 10*3/uL (ref 4.0–11.0)
WBC: 7.3 10*3/uL (ref 4.0–11.0)
WBC: 8.2 10*3/uL (ref 4.0–11.0)
WBC: 9 10*3/uL (ref 4.0–11.0)
WBC: 9.1 10*3/uL (ref 4.0–11.0)

## 2012-05-09 LAB — COMPREHENSIVE METABOLIC PANEL
ALT: 11 U/L (ref 0–55)
ALT: 15 U/L (ref 0–55)
ALT: 7 U/L (ref 0–55)
ALT: 8 U/L (ref 0–55)
AST (SGOT): 10 U/L (ref 10–42)
AST (SGOT): 11 U/L (ref 10–42)
AST (SGOT): 15 U/L (ref 10–42)
AST (SGOT): 33 U/L (ref 10–42)
Albumin/Globulin Ratio: 0.53 Ratio — ABNORMAL LOW (ref 0.70–1.50)
Albumin/Globulin Ratio: 0.68 Ratio — ABNORMAL LOW (ref 0.70–1.50)
Albumin/Globulin Ratio: 0.68 Ratio — ABNORMAL LOW (ref 0.70–1.50)
Albumin/Globulin Ratio: 0.7 Ratio (ref 0.70–1.50)
Albumin: 2.3 gm/dL — ABNORMAL LOW (ref 3.5–5.0)
Albumin: 2.6 gm/dL — ABNORMAL LOW (ref 3.5–5.0)
Albumin: 2.8 gm/dL — ABNORMAL LOW (ref 3.5–5.0)
Albumin: 3.3 gm/dL — ABNORMAL LOW (ref 3.5–5.0)
Alkaline Phosphatase: 113 U/L (ref 40–145)
Alkaline Phosphatase: 143 U/L (ref 40–145)
Alkaline Phosphatase: 152 U/L — ABNORMAL HIGH (ref 40–145)
Alkaline Phosphatase: 216 U/L — ABNORMAL HIGH (ref 40–145)
Anion Gap: 14.2 mMol/L (ref 7.0–18.0)
Anion Gap: 14.3 mMol/L (ref 7.0–18.0)
Anion Gap: 14.7 mMol/L (ref 7.0–18.0)
Anion Gap: 18.2 mMol/L — ABNORMAL HIGH (ref 7.0–18.0)
BUN / Creatinine Ratio: 5.3 Ratio — ABNORMAL LOW (ref 10.0–30.0)
BUN / Creatinine Ratio: 6.1 Ratio — ABNORMAL LOW (ref 10.0–30.0)
BUN / Creatinine Ratio: 7.1 Ratio — ABNORMAL LOW (ref 10.0–30.0)
BUN / Creatinine Ratio: 9.7 Ratio — ABNORMAL LOW (ref 10.0–30.0)
BUN: 4 mg/dL — ABNORMAL LOW (ref 7–22)
BUN: 4 mg/dL — ABNORMAL LOW (ref 7–22)
BUN: 6 mg/dL — ABNORMAL LOW (ref 7–22)
BUN: 7 mg/dL (ref 7–22)
Bilirubin, Total: 0.4 mg/dL (ref 0.1–1.2)
Bilirubin, Total: 0.5 mg/dL (ref 0.1–1.2)
Bilirubin, Total: 0.5 mg/dL (ref 0.1–1.2)
Bilirubin, Total: 0.6 mg/dL (ref 0.1–1.2)
CO2: 17.1 mMol/L — ABNORMAL LOW (ref 20.0–30.0)
CO2: 18.8 mMol/L — ABNORMAL LOW (ref 20.0–30.0)
CO2: 19.8 mMol/L — ABNORMAL LOW (ref 20.0–30.0)
CO2: 23.1 mMol/L (ref 20.0–30.0)
Calcium: 10.2 mg/dL (ref 8.5–10.5)
Calcium: 9.3 mg/dL (ref 8.5–10.5)
Calcium: 9.6 mg/dL (ref 8.5–10.5)
Calcium: 9.6 mg/dL (ref 8.5–10.5)
Chloride: 102 mMol/L (ref 98–110)
Chloride: 105 mMol/L (ref 98–110)
Chloride: 106 mMol/L (ref 98–110)
Chloride: 109 mMol/L (ref 98–110)
Creatinine: 0.66 mg/dL (ref 0.60–1.20)
Creatinine: 0.72 mg/dL (ref 0.60–1.20)
Creatinine: 0.76 mg/dL (ref 0.60–1.20)
Creatinine: 0.84 mg/dL (ref 0.60–1.20)
EGFR: >60 mL/min/{1.73_m2}
EGFR: >60 mL/min/{1.73_m2}
EGFR: >60 mL/min/{1.73_m2}
EGFR: >60 mL/min/{1.73_m2}
Globulin: 3.8 gm/dL (ref 2.0–4.0)
Globulin: 4.1 gm/dL — ABNORMAL HIGH (ref 2.0–4.0)
Globulin: 4.3 gm/dL — ABNORMAL HIGH (ref 2.0–4.0)
Globulin: 4.7 gm/dL — ABNORMAL HIGH (ref 2.0–4.0)
Glucose: 105 mg/dL — ABNORMAL HIGH (ref 70–99)
Glucose: 116 mg/dL — ABNORMAL HIGH (ref 70–99)
Glucose: 121 mg/dL — ABNORMAL HIGH (ref 70–99)
Glucose: 94 mg/dL (ref 70–99)
Osmolality Calc: 267 mOsm/kg — ABNORMAL LOW (ref 275–300)
Osmolality Calc: 268 mOsm/kg — ABNORMAL LOW (ref 275–300)
Osmolality Calc: 273 mOsm/kg — ABNORMAL LOW (ref 275–300)
Osmolality Calc: 277 mOsm/kg (ref 275–300)
Potassium: 3 mMol/L — CL (ref 3.5–5.3)
Potassium: 3.8 mMol/L (ref 3.5–5.3)
Potassium: 4 mMol/L (ref 3.5–5.3)
Potassium: 4.4 mMol/L (ref 3.5–5.3)
Protein, Total: 6.4 gm/dL (ref 6.0–8.3)
Protein, Total: 6.6 gm/dL (ref 6.0–8.3)
Protein, Total: 6.9 gm/dL (ref 6.0–8.3)
Protein, Total: 8 gm/dL (ref 6.0–8.3)
Sodium: 135 mMol/L — ABNORMAL LOW (ref 136–147)
Sodium: 135 mMol/L — ABNORMAL LOW (ref 136–147)
Sodium: 137 mMol/L (ref 136–147)
Sodium: 140 mMol/L (ref 136–147)

## 2012-05-09 LAB — BASIC METABOLIC PANEL
Anion Gap: 11 mMol/L (ref 7.0–18.0)
Anion Gap: 11.3 mMol/L (ref 7.0–18.0)
Anion Gap: 11.4 mMol/L (ref 7.0–18.0)
Anion Gap: 11.5 mMol/L (ref 7.0–18.0)
Anion Gap: 12.4 mMol/L (ref 7.0–18.0)
Anion Gap: 12.7 mMol/L (ref 7.0–18.0)
Anion Gap: 12.8 mMol/L (ref 7.0–18.0)
Anion Gap: 13 mMol/L (ref 7.0–18.0)
Anion Gap: 13.3 mMol/L (ref 7.0–18.0)
Anion Gap: 13.5 mMol/L (ref 7.0–18.0)
Anion Gap: 14.2 mMol/L (ref 7.0–18.0)
Anion Gap: 14.7 mMol/L (ref 7.0–18.0)
Anion Gap: 15.1 mMol/L (ref 7.0–18.0)
Anion Gap: 15.9 mMol/L (ref 7.0–18.0)
Anion Gap: 18 mMol/L (ref 7.0–18.0)
Anion Gap: 18.1 mMol/L — ABNORMAL HIGH (ref 7.0–18.0)
BUN / Creatinine Ratio: 1.6 Ratio — ABNORMAL LOW (ref 10.0–30.0)
BUN / Creatinine Ratio: 1.7 Ratio — ABNORMAL LOW (ref 10.0–30.0)
BUN / Creatinine Ratio: 11 Ratio (ref 10.0–30.0)
BUN / Creatinine Ratio: 14.7 Ratio (ref 10.0–30.0)
BUN / Creatinine Ratio: 15.3 Ratio (ref 10.0–30.0)
BUN / Creatinine Ratio: 2.9 Ratio — ABNORMAL LOW (ref 10.0–30.0)
BUN / Creatinine Ratio: 4.3 Ratio — ABNORMAL LOW (ref 10.0–30.0)
BUN / Creatinine Ratio: 5.1 Ratio — ABNORMAL LOW (ref 10.0–30.0)
BUN / Creatinine Ratio: 5.7 Ratio — ABNORMAL LOW (ref 10.0–30.0)
BUN / Creatinine Ratio: 6.3 Ratio — ABNORMAL LOW (ref 10.0–30.0)
BUN / Creatinine Ratio: 6.7 Ratio — ABNORMAL LOW (ref 10.0–30.0)
BUN / Creatinine Ratio: 6.8 Ratio — ABNORMAL LOW (ref 10.0–30.0)
BUN / Creatinine Ratio: 7.7 Ratio — ABNORMAL LOW (ref 10.0–30.0)
BUN / Creatinine Ratio: 7.7 Ratio — ABNORMAL LOW (ref 10.0–30.0)
BUN / Creatinine Ratio: 8.7 Ratio — ABNORMAL LOW (ref 10.0–30.0)
BUN / Creatinine Ratio: 9.2 Ratio — ABNORMAL LOW (ref 10.0–30.0)
BUN: 10 mg/dL (ref 7–22)
BUN: 13 mg/dL (ref 7–22)
BUN: 2 mg/dL — ABNORMAL LOW (ref 7–22)
BUN: 3 mg/dL — ABNORMAL LOW (ref 7–22)
BUN: 3 mg/dL — ABNORMAL LOW (ref 7–22)
BUN: 4 mg/dL — ABNORMAL LOW (ref 7–22)
BUN: 4 mg/dL — ABNORMAL LOW (ref 7–22)
BUN: 4 mg/dL — ABNORMAL LOW (ref 7–22)
BUN: 5 mg/dL — ABNORMAL LOW (ref 7–22)
BUN: 5 mg/dL — ABNORMAL LOW (ref 7–22)
BUN: 5 mg/dL — ABNORMAL LOW (ref 7–22)
BUN: 6 mg/dL — ABNORMAL LOW (ref 7–22)
BUN: 6 mg/dL — ABNORMAL LOW (ref 7–22)
BUN: 8 mg/dL (ref 7–22)
BUN: <2 mg/dL — ABNORMAL LOW (ref 7–22)
BUN: <2 mg/dL — ABNORMAL LOW (ref 7–22)
CO2: 17.5 mMol/L — ABNORMAL LOW (ref 20.0–30.0)
CO2: 18.6 mMol/L — ABNORMAL LOW (ref 20.0–30.0)
CO2: 19.1 mMol/L — ABNORMAL LOW (ref 20.0–30.0)
CO2: 19.7 mMol/L — ABNORMAL LOW (ref 20.0–30.0)
CO2: 20 mMol/L (ref 20.0–30.0)
CO2: 20.4 mMol/L (ref 20.0–30.0)
CO2: 20.7 mMol/L (ref 20.0–30.0)
CO2: 20.9 mMol/L (ref 20.0–30.0)
CO2: 22.3 mMol/L (ref 20.0–30.0)
CO2: 23 mMol/L (ref 20.0–30.0)
CO2: 23.1 mMol/L (ref 20.0–30.0)
CO2: 23.2 mMol/L (ref 20.0–30.0)
CO2: 23.2 mMol/L (ref 20.0–30.0)
CO2: 23.5 mMol/L (ref 20.0–30.0)
CO2: 24.5 mMol/L (ref 20.0–30.0)
CO2: 25.6 mMol/L (ref 20.0–30.0)
Calcium: 10.5 mg/dL (ref 8.5–10.5)
Calcium: 10.6 mg/dL — ABNORMAL HIGH (ref 8.5–10.5)
Calcium: 8.6 mg/dL (ref 8.5–10.5)
Calcium: 8.6 mg/dL (ref 8.5–10.5)
Calcium: 9 mg/dL (ref 8.5–10.5)
Calcium: 9.1 mg/dL (ref 8.5–10.5)
Calcium: 9.1 mg/dL (ref 8.5–10.5)
Calcium: 9.2 mg/dL (ref 8.5–10.5)
Calcium: 9.2 mg/dL (ref 8.5–10.5)
Calcium: 9.3 mg/dL (ref 8.5–10.5)
Calcium: 9.4 mg/dL (ref 8.5–10.5)
Calcium: 9.5 mg/dL (ref 8.5–10.5)
Calcium: 9.6 mg/dL (ref 8.5–10.5)
Calcium: 9.8 mg/dL (ref 8.5–10.5)
Calcium: 9.8 mg/dL (ref 8.5–10.5)
Calcium: 9.9 mg/dL (ref 8.5–10.5)
Chloride: 101 mMol/L (ref 98–110)
Chloride: 101 mMol/L (ref 98–110)
Chloride: 102 mMol/L (ref 98–110)
Chloride: 102 mMol/L (ref 98–110)
Chloride: 103 mMol/L (ref 98–110)
Chloride: 103 mMol/L (ref 98–110)
Chloride: 105 mMol/L (ref 98–110)
Chloride: 106 mMol/L (ref 98–110)
Chloride: 107 mMol/L (ref 98–110)
Chloride: 107 mMol/L (ref 98–110)
Chloride: 108 mMol/L (ref 98–110)
Chloride: 108 mMol/L (ref 98–110)
Chloride: 108 mMol/L (ref 98–110)
Chloride: 109 mMol/L (ref 98–110)
Chloride: 109 mMol/L (ref 98–110)
Chloride: 109 mMol/L (ref 98–110)
Creatinine: 0.59 mg/dL — ABNORMAL LOW (ref 0.60–1.20)
Creatinine: 0.59 mg/dL — ABNORMAL LOW (ref 0.60–1.20)
Creatinine: 0.6 mg/dL (ref 0.60–1.20)
Creatinine: 0.63 mg/dL (ref 0.60–1.20)
Creatinine: 0.64 mg/dL (ref 0.60–1.20)
Creatinine: 0.65 mg/dL (ref 0.60–1.20)
Creatinine: 0.65 mg/dL (ref 0.60–1.20)
Creatinine: 0.65 mg/dL (ref 0.60–1.20)
Creatinine: 0.68 mg/dL (ref 0.60–1.20)
Creatinine: 0.69 mg/dL (ref 0.60–1.20)
Creatinine: 0.69 mg/dL (ref 0.60–1.20)
Creatinine: 0.7 mg/dL (ref 0.60–1.20)
Creatinine: 0.7 mg/dL (ref 0.60–1.20)
Creatinine: 0.73 mg/dL (ref 0.60–1.20)
Creatinine: 0.75 mg/dL (ref 0.60–1.20)
Creatinine: 0.85 mg/dL (ref 0.60–1.20)
EGFR: >60 mL/min/{1.73_m2}
EGFR: >60 mL/min/{1.73_m2}
EGFR: >60 mL/min/{1.73_m2}
EGFR: >60 mL/min/{1.73_m2}
EGFR: >60 mL/min/{1.73_m2}
EGFR: >60 mL/min/{1.73_m2}
EGFR: >60 mL/min/{1.73_m2}
EGFR: >60 mL/min/{1.73_m2}
EGFR: >60 mL/min/{1.73_m2}
EGFR: >60 mL/min/{1.73_m2}
EGFR: >60 mL/min/{1.73_m2}
EGFR: >60 mL/min/{1.73_m2}
EGFR: >60 mL/min/{1.73_m2}
EGFR: >60 mL/min/{1.73_m2}
EGFR: >60 mL/min/{1.73_m2}
EGFR: >60 mL/min/{1.73_m2}
Glucose: 101 mg/dL — ABNORMAL HIGH (ref 70–99)
Glucose: 102 mg/dL — ABNORMAL HIGH (ref 70–99)
Glucose: 102 mg/dL — ABNORMAL HIGH (ref 70–99)
Glucose: 104 mg/dL — ABNORMAL HIGH (ref 70–99)
Glucose: 106 mg/dL — ABNORMAL HIGH (ref 70–99)
Glucose: 106 mg/dL — ABNORMAL HIGH (ref 70–99)
Glucose: 108 mg/dL — ABNORMAL HIGH (ref 70–99)
Glucose: 116 mg/dL — ABNORMAL HIGH (ref 70–99)
Glucose: 118 mg/dL — ABNORMAL HIGH (ref 70–99)
Glucose: 120 mg/dL — ABNORMAL HIGH (ref 70–99)
Glucose: 121 mg/dL — ABNORMAL HIGH (ref 70–99)
Glucose: 131 mg/dL — ABNORMAL HIGH (ref 70–99)
Glucose: 140 mg/dL — ABNORMAL HIGH (ref 70–99)
Glucose: 85 mg/dL (ref 70–99)
Glucose: 85 mg/dL (ref 70–99)
Glucose: 96 mg/dL (ref 70–99)
Osmolality Calc: 261 mOsm/kg — ABNORMAL LOW (ref 275–300)
Osmolality Calc: 263 mOsm/kg — ABNORMAL LOW (ref 275–300)
Osmolality Calc: 268 mOsm/kg — ABNORMAL LOW (ref 275–300)
Osmolality Calc: 268 mOsm/kg — ABNORMAL LOW (ref 275–300)
Osmolality Calc: 271 mOsm/kg — ABNORMAL LOW (ref 275–300)
Osmolality Calc: 271 mOsm/kg — ABNORMAL LOW (ref 275–300)
Osmolality Calc: 272 mOsm/kg — ABNORMAL LOW (ref 275–300)
Osmolality Calc: 273 mOsm/kg — ABNORMAL LOW (ref 275–300)
Osmolality Calc: 273 mOsm/kg — ABNORMAL LOW (ref 275–300)
Osmolality Calc: 274 mOsm/kg — ABNORMAL LOW (ref 275–300)
Osmolality Calc: 274 mOsm/kg — ABNORMAL LOW (ref 275–300)
Osmolality Calc: 275 mOsm/kg (ref 275–300)
Osmolality Calc: 276 mOsm/kg (ref 275–300)
Osmolality Calc: 277 mOsm/kg (ref 275–300)
Osmolality Calc: 278 mOsm/kg (ref 275–300)
Osmolality Calc: 279 mOsm/kg (ref 275–300)
Potassium: 2.4 mMol/L — CL (ref 3.5–5.3)
Potassium: 2.8 mMol/L — CL (ref 3.5–5.3)
Potassium: 2.9 mMol/L — CL (ref 3.5–5.3)
Potassium: 3.2 mMol/L — ABNORMAL LOW (ref 3.5–5.3)
Potassium: 3.2 mMol/L — ABNORMAL LOW (ref 3.5–5.3)
Potassium: 3.3 mMol/L — ABNORMAL LOW (ref 3.5–5.3)
Potassium: 3.4 mMol/L — ABNORMAL LOW (ref 3.5–5.3)
Potassium: 3.5 mMol/L (ref 3.5–5.3)
Potassium: 3.5 mMol/L (ref 3.5–5.3)
Potassium: 3.6 mMol/L (ref 3.5–5.3)
Potassium: 3.6 mMol/L (ref 3.5–5.3)
Potassium: 3.6 mMol/L (ref 3.5–5.3)
Potassium: 4 mMol/L (ref 3.5–5.3)
Potassium: 4.1 mMol/L (ref 3.5–5.3)
Potassium: 4.5 mMol/L (ref 3.5–5.3)
Potassium: 4.6 mMol/L (ref 3.5–5.3)
Sodium: 132 mMol/L — ABNORMAL LOW (ref 136–147)
Sodium: 133 mMol/L — ABNORMAL LOW (ref 136–147)
Sodium: 135 mMol/L — ABNORMAL LOW (ref 136–147)
Sodium: 136 mMol/L (ref 136–147)
Sodium: 137 mMol/L (ref 136–147)
Sodium: 137 mMol/L (ref 136–147)
Sodium: 137 mMol/L (ref 136–147)
Sodium: 138 mMol/L (ref 136–147)
Sodium: 138 mMol/L (ref 136–147)
Sodium: 138 mMol/L (ref 136–147)
Sodium: 138 mMol/L (ref 136–147)
Sodium: 138 mMol/L (ref 136–147)
Sodium: 139 mMol/L (ref 136–147)
Sodium: 140 mMol/L (ref 136–147)
Sodium: 140 mMol/L (ref 136–147)
Sodium: 140 mMol/L (ref 136–147)

## 2012-05-09 LAB — VANCOMYCIN, TROUGH
Vancomycin Trough: 26.1 ug/mL (ref 10.00–20.00)
Vancomycin Trough: 34.5 ug/mL (ref 10.00–20.00)

## 2012-05-09 LAB — URINALYSIS WITH CULTURE REFLEX
Bilirubin, UA: NEGATIVE mg/dL
Blood, UA: NEGATIVE mg/dL
Glucose, UA: NEGATIVE mg/dL
Ketones UA: NEGATIVE mg/dL
Leukocyte Esterase, UA: NEGATIVE Leu/uL
Nitrite, UA: NEGATIVE
RBC, UA: 2 /hpf (ref 0–5)
Squam Epithel, UA: <1 /hpf (ref 0–2)
Urine Protein: NEGATIVE mg/dL
Urine Specific Gravity: 1.018 (ref 1.001–1.040)
Urobilinogen, UA: NORMAL mg/dL
WBC, UA: <1 /hpf (ref 0–4)
pH, Urine: 5 pH (ref 5.0–8.0)

## 2012-05-09 LAB — HEPATIC FUNCTION PANEL
ALT: 12 U/L (ref 0–55)
ALT: 9 U/L (ref 0–55)
ALT: 9 U/L (ref 0–55)
AST (SGOT): 10 U/L (ref 10–42)
AST (SGOT): 15 U/L (ref 10–42)
AST (SGOT): 17 U/L (ref 10–42)
Albumin/Globulin Ratio: 0.58 Ratio — ABNORMAL LOW (ref 0.70–1.50)
Albumin/Globulin Ratio: 0.62 Ratio — ABNORMAL LOW (ref 0.70–1.50)
Albumin/Globulin Ratio: 0.68 Ratio — ABNORMAL LOW (ref 0.70–1.50)
Albumin: 2.3 gm/dL — ABNORMAL LOW (ref 3.5–5.0)
Albumin: 3.2 gm/dL — ABNORMAL LOW (ref 3.5–5.0)
Albumin: 3.3 gm/dL — ABNORMAL LOW (ref 3.5–5.0)
Alkaline Phosphatase: 146 U/L — ABNORMAL HIGH (ref 40–145)
Alkaline Phosphatase: 157 U/L — ABNORMAL HIGH (ref 40–145)
Alkaline Phosphatase: 165 U/L — ABNORMAL HIGH (ref 40–145)
Bilirubin Direct: 0.2 mg/dL (ref 0.0–0.3)
Bilirubin Direct: 0.2 mg/dL (ref 0.0–0.3)
Bilirubin Direct: 0.3 mg/dL (ref 0.0–0.3)
Bilirubin, Total: 0.6 mg/dL (ref 0.1–1.2)
Bilirubin, Total: 0.6 mg/dL (ref 0.1–1.2)
Bilirubin, Total: 0.8 mg/dL (ref 0.1–1.2)
Globulin: 4 gm/dL (ref 2.0–4.0)
Globulin: 4.7 gm/dL — ABNORMAL HIGH (ref 2.0–4.0)
Globulin: 5.3 gm/dL — ABNORMAL HIGH (ref 2.0–4.0)
Protein, Total: 6.3 gm/dL (ref 6.0–8.3)
Protein, Total: 7.9 gm/dL (ref 6.0–8.3)
Protein, Total: 8.6 gm/dL — ABNORMAL HIGH (ref 6.0–8.3)

## 2012-05-09 LAB — URINALYSIS
Bilirubin, UA: NEGATIVE mg/dL
Blood, UA: NEGATIVE mg/dL
Glucose, UA: NEGATIVE mg/dL
Ketones UA: 20 mg/dL — AB
Leukocyte Esterase, UA: 25 Leu/uL — AB
Nitrite, UA: NEGATIVE
RBC, UA: 3 /hpf (ref 0–5)
Squam Epithel, UA: 4 /hpf — ABNORMAL HIGH (ref 0–2)
Urine Protein: 30 mg/dL — AB
Urine Specific Gravity: 1.015 (ref 1.001–1.040)
Urobilinogen, UA: NORMAL mg/dL
WBC, UA: 12 /hpf — ABNORMAL HIGH (ref 0–4)
pH, Urine: 6.5 pH (ref 5.0–8.0)

## 2012-05-09 LAB — LIPASE
Lipase: 101 U/L — ABNORMAL HIGH (ref 8–78)
Lipase: 1023 U/L — ABNORMAL HIGH (ref 8–78)
Lipase: 1121 U/L — ABNORMAL HIGH (ref 8–78)
Lipase: 113 U/L — ABNORMAL HIGH (ref 8–78)
Lipase: 146 U/L — ABNORMAL HIGH (ref 8–78)
Lipase: 163 U/L — ABNORMAL HIGH (ref 8–78)
Lipase: 169 U/L — ABNORMAL HIGH (ref 8–78)
Lipase: 178 U/L — ABNORMAL HIGH (ref 8–78)
Lipase: 206 U/L — ABNORMAL HIGH (ref 8–78)
Lipase: 2314 U/L — ABNORMAL HIGH (ref 8–78)
Lipase: 289 U/L — ABNORMAL HIGH (ref 8–78)
Lipase: 289 U/L — ABNORMAL HIGH (ref 8–78)
Lipase: 295 U/L — ABNORMAL HIGH (ref 8–78)
Lipase: 333 U/L — ABNORMAL HIGH (ref 8–78)
Lipase: 369 U/L — ABNORMAL HIGH (ref 8–78)
Lipase: 39 U/L (ref 8–78)
Lipase: 471 U/L — ABNORMAL HIGH (ref 8–78)
Lipase: 478 U/L — ABNORMAL HIGH (ref 8–78)
Lipase: 755 U/L — ABNORMAL HIGH (ref 8–78)
Lipase: 799 U/L — ABNORMAL HIGH (ref 8–78)
Lipase: 806 U/L — ABNORMAL HIGH (ref 8–78)

## 2012-05-09 LAB — VH URINE DRUG SCREEN - NO CONFIRMATION
Amphetamine: NEGATIVE
Barbiturates: NEGATIVE
Cannabinoids: NEGATIVE
Cocaine: NEGATIVE
Opiates: POSITIVE — AB
Phencyclidine: NEGATIVE
Urine Benzodiazepines: NEGATIVE
Urine Creatinine Random: 153.95 mg/dL
Urine Methadone Screen: NEGATIVE
Urine Oxycodone: NEGATIVE
Urine Specific Gravity: 1.012 (ref 1.001–1.040)
pH, Urine: 6.4 pH (ref 5.0–8.0)

## 2012-05-09 LAB — PHOSPHORUS
Phosphorus: 2.3 mg/dL (ref 2.3–4.7)
Phosphorus: 4.1 mg/dL (ref 2.3–4.7)

## 2012-05-09 LAB — MAGNESIUM
Magnesium: 0.8 mg/dL — CL (ref 1.6–2.6)
Magnesium: 1.3 mg/dL — ABNORMAL LOW (ref 1.6–2.6)
Magnesium: 1.4 mg/dL — ABNORMAL LOW (ref 1.6–2.6)
Magnesium: 1.4 mg/dL — ABNORMAL LOW (ref 1.6–2.6)
Magnesium: 1.4 mg/dL — ABNORMAL LOW (ref 1.6–2.6)
Magnesium: 1.5 mg/dL — ABNORMAL LOW (ref 1.6–2.6)
Magnesium: 1.5 mg/dL — ABNORMAL LOW (ref 1.6–2.6)
Magnesium: 1.6 mg/dL (ref 1.6–2.6)
Magnesium: 1.7 mg/dL (ref 1.6–2.6)
Magnesium: 1.9 mg/dL (ref 1.6–2.6)

## 2012-05-09 LAB — POTASSIUM
Potassium: 3.2 mMol/L — ABNORMAL LOW (ref 3.5–5.3)
Potassium: 3.5 mMol/L (ref 3.5–5.3)

## 2012-05-09 LAB — LACTIC ACID, PLASMA: Lactic Acid: 1.8 mMol/L (ref 0.5–2.1)

## 2012-05-09 LAB — VANCOMYCIN, RANDOM: Vancomycin Random: 14.9 ug/mL (ref 5.00–40.00)

## 2012-05-09 LAB — AMYLASE
Amylase: 144 U/L — ABNORMAL HIGH (ref 30–135)
Amylase: 894 U/L — ABNORMAL HIGH (ref 30–135)

## 2012-05-09 LAB — VH C. DIFFICILE TOXIN B GENE BY DNA AMPLIFICATION

## 2012-05-10 LAB — CBC AND DIFFERENTIAL
Basophils %: 0.1 % (ref 0.0–3.0)
Basophils %: 0.4 % (ref 0.0–3.0)
Basophils %: 0.6 % (ref 0.0–3.0)
Basophils %: 0.7 % (ref 0.0–3.0)
Basophils Absolute: 0 10*3/uL (ref 0.0–0.3)
Basophils Absolute: 0 10*3/uL (ref 0.0–0.3)
Basophils Absolute: 0.1 10*3/uL (ref 0.0–0.3)
Basophils Absolute: 0.1 10*3/uL (ref 0.0–0.3)
Eosinophils %: 1.2 % (ref 0.0–7.0)
Eosinophils %: 1.7 % (ref 0.0–7.0)
Eosinophils %: 2.2 % (ref 0.0–7.0)
Eosinophils %: 2.5 % (ref 0.0–7.0)
Eosinophils Absolute: 0.1 10*3/uL (ref 0.0–0.8)
Eosinophils Absolute: 0.2 10*3/uL (ref 0.0–0.8)
Eosinophils Absolute: 0.2 10*3/uL (ref 0.0–0.8)
Eosinophils Absolute: 0.2 10*3/uL (ref 0.0–0.8)
Hematocrit: 29.1 % — ABNORMAL LOW (ref 36.0–48.0)
Hematocrit: 30.1 % — ABNORMAL LOW (ref 36.0–48.0)
Hematocrit: 30.2 % — ABNORMAL LOW (ref 36.0–48.0)
Hematocrit: 34.6 % — ABNORMAL LOW (ref 36.0–48.0)
Hemoglobin: 10.1 gm/dL — ABNORMAL LOW (ref 12.0–16.0)
Hemoglobin: 10.2 gm/dL — ABNORMAL LOW (ref 12.0–16.0)
Hemoglobin: 11.3 gm/dL — ABNORMAL LOW (ref 12.0–16.0)
Hemoglobin: 9.8 gm/dL — ABNORMAL LOW (ref 12.0–16.0)
Lymphocytes Absolute: 1.1 10*3/uL (ref 0.6–5.1)
Lymphocytes Absolute: 1.4 10*3/uL (ref 0.6–5.1)
Lymphocytes Absolute: 1.5 10*3/uL (ref 0.6–5.1)
Lymphocytes Absolute: 1.7 10*3/uL (ref 0.6–5.1)
Lymphocytes: 12.7 % — ABNORMAL LOW (ref 15.0–46.0)
Lymphocytes: 13.8 % — ABNORMAL LOW (ref 15.0–46.0)
Lymphocytes: 16.3 % (ref 15.0–46.0)
Lymphocytes: 16.8 % (ref 15.0–46.0)
MCH: 31 pg (ref 28–35)
MCH: 33 pg (ref 28–35)
MCH: 33 pg (ref 28–35)
MCH: 33 pg (ref 28–35)
MCHC: 33 gm/dL (ref 32–36)
MCHC: 33 gm/dL (ref 32–36)
MCHC: 34 gm/dL (ref 32–36)
MCHC: 34 gm/dL (ref 32–36)
MCV: 96 fL (ref 80–100)
MCV: 97 fL (ref 80–100)
MCV: 97 fL (ref 80–100)
MCV: 97 fL (ref 80–100)
MPV: 6.7 fL (ref 6.0–10.0)
MPV: 6.7 fL (ref 6.0–10.0)
MPV: 6.9 fL (ref 6.0–10.0)
MPV: 7 fL (ref 6.0–10.0)
Monocytes Absolute: 0.9 10*3/uL (ref 0.1–1.7)
Monocytes Absolute: 0.9 10*3/uL (ref 0.1–1.7)
Monocytes Absolute: 1 10*3/uL (ref 0.1–1.7)
Monocytes Absolute: 1.1 10*3/uL (ref 0.1–1.7)
Monocytes: 10 % (ref 3.0–15.0)
Monocytes: 10.1 % (ref 3.0–15.0)
Monocytes: 12.5 % (ref 3.0–15.0)
Monocytes: 8.1 % (ref 3.0–15.0)
Neutrophils %: 69.2 % (ref 42.0–78.0)
Neutrophils %: 70 % (ref 42.0–78.0)
Neutrophils %: 74.4 % (ref 42.0–78.0)
Neutrophils %: 76.8 % (ref 42.0–78.0)
Neutrophils Absolute: 6.1 10*3/uL (ref 1.7–8.6)
Neutrophils Absolute: 6.1 10*3/uL (ref 1.7–8.6)
Neutrophils Absolute: 6.3 10*3/uL (ref 1.7–8.6)
Neutrophils Absolute: 9.3 10*3/uL — ABNORMAL HIGH (ref 1.7–8.6)
PLT CT: 294 10*3/uL (ref 130–440)
PLT CT: 310 10*3/uL (ref 130–440)
PLT CT: 317 10*3/uL (ref 130–440)
PLT CT: 406 10*3/uL (ref 130–440)
RBC: 3.01 10*6/uL — ABNORMAL LOW (ref 3.80–5.00)
RBC: 3.11 10*6/uL — ABNORMAL LOW (ref 3.80–5.00)
RBC: 3.11 10*6/uL — ABNORMAL LOW (ref 3.80–5.00)
RBC: 3.6 10*6/uL — ABNORMAL LOW (ref 3.80–5.00)
RDW: 13.7 % (ref 11.0–14.0)
RDW: 13.8 % (ref 11.0–14.0)
RDW: 13.8 % (ref 11.0–14.0)
RDW: 13.8 % (ref 11.0–14.0)
WBC: 12.2 10*3/uL — ABNORMAL HIGH (ref 4.0–11.0)
WBC: 8.5 10*3/uL (ref 4.0–11.0)
WBC: 8.8 10*3/uL (ref 4.0–11.0)
WBC: 8.8 10*3/uL (ref 4.0–11.0)

## 2012-05-10 LAB — BASIC METABOLIC PANEL
Anion Gap: 12.3 mMol/L (ref 7.0–18.0)
Anion Gap: 13 mMol/L (ref 7.0–18.0)
Anion Gap: 13.4 mMol/L (ref 7.0–18.0)
Anion Gap: 14.4 mMol/L (ref 7.0–18.0)
Anion Gap: 14.7 mMol/L (ref 7.0–18.0)
Anion Gap: 17.5 mMol/L (ref 7.0–18.0)
BUN / Creatinine Ratio: 10.3 Ratio (ref 10.0–30.0)
BUN / Creatinine Ratio: 11.4 Ratio (ref 10.0–30.0)
BUN / Creatinine Ratio: 11.9 Ratio (ref 10.0–30.0)
BUN / Creatinine Ratio: 6.6 Ratio — ABNORMAL LOW (ref 10.0–30.0)
BUN / Creatinine Ratio: 7.4 Ratio — ABNORMAL LOW (ref 10.0–30.0)
BUN / Creatinine Ratio: 9.4 Ratio — ABNORMAL LOW (ref 10.0–30.0)
BUN: 5 mg/dL — ABNORMAL LOW (ref 7–22)
BUN: 5 mg/dL — ABNORMAL LOW (ref 7–22)
BUN: 6 mg/dL — ABNORMAL LOW (ref 7–22)
BUN: 8 mg/dL (ref 7–22)
BUN: 8 mg/dL (ref 7–22)
BUN: 8 mg/dL (ref 7–22)
CO2: 17.2 mMol/L — ABNORMAL LOW (ref 20.0–30.0)
CO2: 19 mMol/L — ABNORMAL LOW (ref 20.0–30.0)
CO2: 19.5 mMol/L — ABNORMAL LOW (ref 20.0–30.0)
CO2: 19.8 mMol/L — ABNORMAL LOW (ref 20.0–30.0)
CO2: 21.8 mMol/L (ref 20.0–30.0)
CO2: 24.6 mMol/L (ref 20.0–30.0)
Calcium: 9.3 mg/dL (ref 8.5–10.5)
Calcium: 9.3 mg/dL (ref 8.5–10.5)
Calcium: 9.6 mg/dL (ref 8.5–10.5)
Calcium: 9.8 mg/dL (ref 8.5–10.5)
Calcium: 9.8 mg/dL (ref 8.5–10.5)
Calcium: 9.9 mg/dL (ref 8.5–10.5)
Chloride: 103 mMol/L (ref 98–110)
Chloride: 105 mMol/L (ref 98–110)
Chloride: 105 mMol/L (ref 98–110)
Chloride: 105 mMol/L (ref 98–110)
Chloride: 106 mMol/L (ref 98–110)
Chloride: 107 mMol/L (ref 98–110)
Creatinine: 0.64 mg/dL (ref 0.60–1.20)
Creatinine: 0.67 mg/dL (ref 0.60–1.20)
Creatinine: 0.68 mg/dL (ref 0.60–1.20)
Creatinine: 0.7 mg/dL (ref 0.60–1.20)
Creatinine: 0.76 mg/dL (ref 0.60–1.20)
Creatinine: 0.78 mg/dL (ref 0.60–1.20)
EGFR: >60 mL/min/{1.73_m2}
EGFR: >60 mL/min/{1.73_m2}
EGFR: >60 mL/min/{1.73_m2}
EGFR: >60 mL/min/{1.73_m2}
EGFR: >60 mL/min/{1.73_m2}
EGFR: >60 mL/min/{1.73_m2}
Glucose: 106 mg/dL — ABNORMAL HIGH (ref 70–99)
Glucose: 115 mg/dL — ABNORMAL HIGH (ref 70–99)
Glucose: 116 mg/dL — ABNORMAL HIGH (ref 70–99)
Glucose: 133 mg/dL — ABNORMAL HIGH (ref 70–99)
Glucose: 63 mg/dL — ABNORMAL LOW (ref 70–99)
Glucose: 92 mg/dL (ref 70–99)
Osmolality Calc: 266 mOsm/kg — ABNORMAL LOW (ref 275–300)
Osmolality Calc: 267 mOsm/kg — ABNORMAL LOW (ref 275–300)
Osmolality Calc: 270 mOsm/kg — ABNORMAL LOW (ref 275–300)
Osmolality Calc: 271 mOsm/kg — ABNORMAL LOW (ref 275–300)
Osmolality Calc: 274 mOsm/kg — ABNORMAL LOW (ref 275–300)
Osmolality Calc: 276 mOsm/kg (ref 275–300)
Potassium: 2.9 mMol/L — CL (ref 3.5–5.3)
Potassium: 3.2 mMol/L — ABNORMAL LOW (ref 3.5–5.3)
Potassium: 3.3 mMol/L — ABNORMAL LOW (ref 3.5–5.3)
Potassium: 3.5 mMol/L (ref 3.5–5.3)
Potassium: 3.6 mMol/L (ref 3.5–5.3)
Potassium: 3.7 mMol/L (ref 3.5–5.3)
Sodium: 133 mMol/L — ABNORMAL LOW (ref 136–147)
Sodium: 135 mMol/L — ABNORMAL LOW (ref 136–147)
Sodium: 136 mMol/L (ref 136–147)
Sodium: 137 mMol/L (ref 136–147)
Sodium: 138 mMol/L (ref 136–147)
Sodium: 139 mMol/L (ref 136–147)

## 2012-05-10 LAB — HEPATIC FUNCTION PANEL
ALT: 7 U/L (ref 0–55)
ALT: 8 U/L (ref 0–55)
AST (SGOT): 13 U/L (ref 10–42)
AST (SGOT): 14 U/L (ref 10–42)
Albumin/Globulin Ratio: 0.6 Ratio — ABNORMAL LOW (ref 0.70–1.50)
Albumin/Globulin Ratio: 0.69 Ratio — ABNORMAL LOW (ref 0.70–1.50)
Albumin: 2.4 gm/dL — ABNORMAL LOW (ref 3.5–5.0)
Albumin: 2.9 gm/dL — ABNORMAL LOW (ref 3.5–5.0)
Alkaline Phosphatase: 134 U/L (ref 40–145)
Alkaline Phosphatase: 135 U/L (ref 40–145)
Bilirubin Direct: 0.3 mg/dL (ref 0.0–0.3)
Bilirubin Direct: 0.3 mg/dL (ref 0.0–0.3)
Bilirubin, Total: 0.7 mg/dL (ref 0.1–1.2)
Bilirubin, Total: 0.7 mg/dL (ref 0.1–1.2)
Globulin: 4 gm/dL (ref 2.0–4.0)
Globulin: 4.2 gm/dL — ABNORMAL HIGH (ref 2.0–4.0)
Protein, Total: 6.4 gm/dL (ref 6.0–8.3)
Protein, Total: 7.1 gm/dL (ref 6.0–8.3)

## 2012-05-10 LAB — URINALYSIS WITH CULTURE REFLEX
Bilirubin, UA: NEGATIVE mg/dL
Blood, UA: NEGATIVE mg/dL
Glucose, UA: NEGATIVE mg/dL
Ketones UA: 20 mg/dL — AB
Leukocyte Esterase, UA: NEGATIVE Leu/uL
Nitrite, UA: NEGATIVE
RBC, UA: 3 /hpf (ref 0–5)
Squam Epithel, UA: 1 /hpf (ref 0–2)
Urine Protein: NEGATIVE mg/dL
Urine Specific Gravity: 1.01 (ref 1.001–1.040)
Urobilinogen, UA: NORMAL mg/dL
WBC, UA: 5 /hpf — ABNORMAL HIGH (ref 0–4)
pH, Urine: 6.5 pH (ref 5.0–8.0)

## 2012-05-10 LAB — VH URINE DRUG SCREEN - NO CONFIRMATION
Amphetamine: NEGATIVE
Barbiturates: NEGATIVE
Cannabinoids: NEGATIVE
Cocaine: NEGATIVE
Opiates: NEGATIVE
Phencyclidine: NEGATIVE
Urine Benzodiazepines: NEGATIVE
Urine Creatinine Random: 56.63 mg/dL
Urine Methadone Screen: NEGATIVE
Urine Oxycodone: NEGATIVE
Urine Specific Gravity: 1.014 (ref 1.001–1.040)
pH, Urine: 6.2 pH (ref 5.0–8.0)

## 2012-05-10 LAB — VANCOMYCIN, TROUGH: Vancomycin Trough: 9.9 ug/mL — ABNORMAL LOW (ref 10.00–20.00)

## 2012-05-10 LAB — PHOSPHORUS: Phosphorus: 3.8 mg/dL (ref 2.3–4.7)

## 2012-05-10 LAB — LIPASE
Lipase: 269 U/L — ABNORMAL HIGH (ref 8–78)
Lipase: 291 U/L — ABNORMAL HIGH (ref 8–78)
Lipase: 306 U/L — ABNORMAL HIGH (ref 8–78)
Lipase: 584 U/L — ABNORMAL HIGH (ref 8–78)

## 2012-05-10 LAB — MAGNESIUM
Magnesium: 1.5 mg/dL — ABNORMAL LOW (ref 1.6–2.6)
Magnesium: 1.6 mg/dL (ref 1.6–2.6)

## 2012-05-10 LAB — ETHANOL: Alcohol: <10 mg/dL (ref 0–9)

## 2012-05-20 LAB — URINALYSIS
Bilirubin, UA: NEGATIVE mg/dL
Blood, UA: NEGATIVE mg/dL
Glucose, UA: NEGATIVE mg/dL
Ketones UA: NEGATIVE mg/dL
Leukocyte Esterase, UA: 25 Leu/uL — AB
Nitrite, UA: POSITIVE — AB
Squam Epithel, UA: 1 /hpf (ref 0–2)
Urine Protein: NEGATIVE mg/dL
Urine Specific Gravity: 1.005 (ref 1.001–1.040)
Urobilinogen, UA: NORMAL mg/dL
WBC, UA: <1 /hpf (ref 0–4)
pH, Urine: 6 pH (ref 5.0–8.0)

## 2012-05-20 LAB — COMPREHENSIVE METABOLIC PANEL
ALT: 22 U/L (ref 0–55)
AST (SGOT): 24 U/L (ref 10–42)
Albumin/Globulin Ratio: 0.97 Ratio (ref 0.70–1.50)
Albumin: 3.7 gm/dL (ref 3.5–5.0)
Alkaline Phosphatase: 174 U/L — ABNORMAL HIGH (ref 40–145)
Anion Gap: 16.4 mMol/L (ref 7.0–18.0)
BUN / Creatinine Ratio: 4.9 Ratio — ABNORMAL LOW (ref 10.0–30.0)
BUN: 4 mg/dL — ABNORMAL LOW (ref 7–22)
Bilirubin, Total: 0.6 mg/dL (ref 0.1–1.2)
CO2: 22.7 mMol/L (ref 20.0–30.0)
Calcium: 9.9 mg/dL (ref 8.5–10.5)
Chloride: 105 mMol/L (ref 98–110)
Creatinine: 0.82 mg/dL (ref 0.60–1.20)
EGFR: >60 mL/min/{1.73_m2}
Globulin: 3.8 gm/dL (ref 2.0–4.0)
Glucose: 99 mg/dL (ref 70–99)
Osmolality Calc: 276 mOsm/kg (ref 275–300)
Potassium: 4.1 mMol/L (ref 3.5–5.3)
Protein, Total: 7.5 gm/dL (ref 6.0–8.3)
Sodium: 140 mMol/L (ref 136–147)

## 2012-05-20 LAB — LIPID PANEL
Cholesterol: 238 mg/dL — ABNORMAL HIGH (ref 75–199)
Coronary Heart Disease Risk: 4.25
HDL: 56 mg/dL (ref 45–65)
LDL Calculated: 105 mg/dL
Triglycerides: 385 mg/dL — ABNORMAL HIGH (ref 10–150)
VLDL: 77 — ABNORMAL HIGH (ref 0–40)

## 2012-05-20 LAB — CBC AND DIFFERENTIAL
Basophils %: 0.9 % (ref 0.0–3.0)
Basophils Absolute: 0.1 10*3/uL (ref 0.0–0.3)
Eosinophils %: 2.9 % (ref 0.0–7.0)
Eosinophils Absolute: 0.2 10*3/uL (ref 0.0–0.8)
Hematocrit: 42.5 % (ref 36.0–48.0)
Hemoglobin: 14.7 gm/dL (ref 12.0–16.0)
Lymphocytes Absolute: 2.3 10*3/uL (ref 0.6–5.1)
Lymphocytes: 33.5 % (ref 15.0–46.0)
MCH: 35 pg (ref 28–35)
MCHC: 35 gm/dL (ref 32–36)
MCV: 101 fL — ABNORMAL HIGH (ref 80–100)
MPV: 6.5 fL (ref 6.0–10.0)
Monocytes Absolute: 0.4 10*3/uL (ref 0.1–1.7)
Monocytes: 6.4 % (ref 3.0–15.0)
Neutrophils %: 56.3 % (ref 42.0–78.0)
Neutrophils Absolute: 3.8 10*3/uL (ref 1.7–8.6)
PLT CT: 333 10*3/uL (ref 130–440)
RBC: 4.19 10*6/uL (ref 3.80–5.00)
RDW: 12.3 % (ref 11.0–14.0)
WBC: 6.8 10*3/uL (ref 4.0–11.0)

## 2012-05-20 LAB — THYROID STIMULATING HORMONE (TSH), REFLEX ON ABNORMAL TO FREE T4, SERUM: TSH: 0.64 u[IU]/mL (ref 0.40–4.20)

## 2012-05-20 LAB — AMYLASE: Amylase: 27 U/L — ABNORMAL LOW (ref 30–135)

## 2012-05-20 LAB — LIPASE: Lipase: 13 U/L (ref 8–78)

## 2012-05-20 LAB — HEMOGLOBIN A1C: Hgb A1C, %: 5.3 %

## 2012-05-20 LAB — VITAMIN D,25 OH,TOTAL: Vitamin D 25-Hydroxy: 46 ng/mL (ref 30–80)

## 2012-08-20 LAB — ECG 12-LEAD
P Wave Axis: 42 deg
P Wave Axis: 72 deg
P Wave Duration: 110 ms
P Wave Duration: 114 ms
P-R Interval: 174 ms
P-R Interval: 176 ms
Patient Age: 51 years
Patient Age: 51 years
Q-T Dispersion: 12 ms
Q-T Dispersion: 16 ms
Q-T Interval(Corrected): 439 ms
Q-T Interval(Corrected): 465 ms
Q-T Interval: 396 ms
Q-T Interval: 502 ms
QRS Axis: 18 deg
QRS Axis: 20 deg
QRS Duration: 88 ms
QRS Duration: 90 ms
T Axis: 40 deg
T Axis: 58 deg
Ventricular Rate: 46 /min
Ventricular Rate: 83 /min

## 2012-08-30 NOTE — H&P (Signed)
[facility]                             HISTORY AND PHYSICAL              NAME: [name], [name]             ADMITTED:   10/13/2009       MR#:  [id number]                            ACCT#:      [id number]       DOB:  01-01-1961       _________________________________________________________________       CHIEF COMPLAINT:       Abdominal pain.              HISTORY OF PRESENT ILLNESS:       A 52 year old female with a history of gallstone pancreatitis       last year, status post cholecystectomy subsequently, who       presented to the ER this morning because of acute onset of       epigastric pain radiating to her back that started overnight.       She normally drinks 5 to 6 drinks per week, although over this       past holiday weekend she "over indulged" in alcohol.  Otherwise       the patient has been in her usual state of health.  She does       report a subjective fever.  Her abdominal pain has also been       associated with nausea and vomiting.  In the ER, her workup       showed evidence of pancreatitis with the lipase being over 6000.       Hemodynamically, however, she has been stable.  Subsequently she       is being admitted to medical floor for further treatment.              PAST MEDICAL HISTORY:       Per HPI, gallstone pancreatitis, status post cholecystectomy.              CURRENT OUTPATIENT MEDICATIONS:       Albuterol p.r.n.              FAMILY HISTORY:       Reviewed and noncontributory to her current condition.              SOCIAL HISTORY:       She does smoke 1 pack per day and drinks 5-7 drinks per week,       although this weekend she had far more alcohol than her       baseline.  She denies any history of substance abuse.              ALLERGIES:       No known drug allergies.              REVIEW OF SYSTEMS:       Per HPI.  All other pertinent systems reviewed and negative.              PHYSICAL EXAMINATION:       VITAL SIGNS:  Her initial blood  pressure was 186/92.  More       recently, her blood pressure has decreased to  130/79, heart rate       72, respirations 16, O2 sat is 97% on room air.       GENERAL:  The patient appears uncomfortable because of retching       and vomiting, although she does not appear toxic.       EYES:  Pink conjunctivae.  Anicteric.       CARDIOVASCULAR:  Regular rate and rhythm.  Without murmur.       RESPIRATORY:  Lungs are clear to auscultation bilaterally.       GI:  Abdomen is soft but tender in the epigastric area, with       guarding.  No rebound.  Positive bowel sounds, hypoactive.       MUSCULOSKELETAL:  There is no joint effusion.  Range of motion       of all major joints is intact.       PSYCHIATRIC:  Alert and oriented x3.  Normal affect.       NEUROLOGICAL:  There is no focal deficit.              SKIN:  There is no rash or wounds.       LYMPHATICS:  Negative.              DATA:       Chemistry labs showed normal electrolytes, glucose 168.  Renal       function is also within normal limits.  AST 82, ALT 58.  Total       bili, alk phos are within normal limits.  Lipase over 6000.  EKG       showed prolonged QT interval.  Coags are normal.  WBC 14.8,       hematocrit 41.3.  Platelets 397.  Chest x-ray shows basilar       atelectasis without convincing evidence for infiltrates.              ASSESSMENT AND PLAN:       A 52 year old female with a history of gallstone pancreatitis,       status post cholecystectomy, presents with recurrent       pancreatitis, most likely secondary to alcohol abuse based on       history.  Hemodynamically stable.  Will admit patient to medical       floor, n.p.o., supportive care with IV fluid and pain control as       well as antiemetics, gastrointestinal and deep venous thrombosis       prophylaxis.  Monitor labs.  Hold antibiotics, unless patient's       condition worsens.                            **PRELIMINARY REPORT UNLESS SIGNED**       SEE DOCUMENT IMAGING SYSTEM FOR FINAL  REPORT                                 ________________________________                              Durward Mallard, MD  ID:   [id number]       DocType:   01TRH       \:   JR       /:   [id number]       DD:  10/13/2009 12:01:34       DT:  10/13/2009 12:50:25       JOB: [id number]            [facility] ([id number])       >  Authenticated by Marlou Sa., MD On 11/11/2009 07:50:44 AM

## 2012-08-30 NOTE — Discharge Summary (Signed)
[facility]                               DISCHARGE SUMMARY         NAME: [name], [name]             ADMITTED:   10/13/2009       MR#:  [id number]                            DISCHARGED: 10/16/2009       DOB:  1960/10/22                           ACCT#:      [id number]       _________________________________________________________________       DISCHARGE DIAGNOSES:       1.  Acute pancreatitis, resolved.       2.  Alcohol abuse.       3.  Hypoxia, probably related to atelectasis.  This has           resolved.       4.  Nicotine abuse.         DISPOSITION:       Patient is being discharged home.         DISCHARGE MEDICATIONS:       Please review the medication reconciliation sheet for details.         RECOMMENDED FOLLOWUP:       Patient had a 2-D echocardiogram done on this admission and that       report is pending.  Patient advised to follow up with the [facility] to get the report.         LATEST LABORATORY AND DIAGNOSTIC STUDIES:       Serum lipase is within normal limits of 75.  Her most recent       chem-7 shows a blood sugar of 115, sodium of 140, potassium of       3.5, BUN of 11, creatinine of 7.9, albumin of 2.9.  Preliminary       CT scan report done on this admission did not show any pulmonary       embolism.  Chest x-ray on admission shows a cardiomediastinal       silhouette being within normal limits with non-streaky airspace       disease in the posterior lung,possibly due to atelectasis, though       inflammation was not excluded.         A 12-lead EKG done this admission shows sinus tachycardia.         HISTORY OF PRESENT ILLNESS/HOSPITAL COURSE:       [name] is a 52 year old lady with a history of gallstone       pancreatitis.  She is status post cholecystectomy.  She also has       a history of alcohol abuse.  She presented to the hospital       complaining of abdominal pain.  Kindly review the admission H       and P by Dr. Dierdre Searles for  details.  Pertinent labs on admission showed       elevated serum lipase level of 6000, AST was 82, ALT of 59.       Total bilirubin and  alkaline phosphatase were within normal       limits.  EKG had shown prolonged QT-wave interval.  Patient was       admitted to the hospital, was made n.p.o., given IV fluids, and       lipase and amylase were monitored serially.         Patient had an episode of tachycardia, diaphoresis, and hypoxia.       CT scan of the chest was done and did not show any PE.  A       12-lead EKG did show sinus tachycardia.  Patient was placed on       supplemental oxygen, was given incentive spirometer to use.       Tachycardia subsequently resolved.  Hypoxia also improved on       incentive spirometer.  A 2-D echocardiogram was ordered, but       report is not available as of this dictation.  Patient advised       to follow up at the [facility].  She is being discharged       in stable condition.  Patient has been counseled to stop smoking       and drink alcohol only judiciously.               **PRELIMINARY REPORT UNLESS SIGNED**       SEE DOCUMENT IMAGING SYSTEM FOR FINAL REPORT         ________________________________                              Autumn Bottom, MD           ID:   [id number]       DocType:   01TRD       \Zack Seal       /:   [id number]       DD:  10/16/2009 13:49:33       DT:  10/17/2009 06:54:19       JOB: [id number]            [facility] [phone])       >  Authenticated and Edited by Barbette Merino., MD On 10/17/09 2:34:30 PM

## 2012-08-30 NOTE — Procedures (Signed)
[facility]                               CARDIOVASCULAR LABORATORY       2-D Echocardiogram              NAME: [name], [name]                              ROOM#: I/--5C              DOB:  12-Mar-1961                                            AGE/SEX: 49/F       ZO#:[id number]       [id number]       _________________________________________________________________________              TEST DATE: 10/15/2009                            REQ PHYS: Durward Mallard, MD       ORDER#: [id number]                                  INT PHYS: Elbert Ewings, MD       HGT: 5'6                                                      WEIGHT: 197       TECH: LS                                                       BP: 132/83       _________________________________________________________________________              TIME OF TEST:       14:29 PM              INDICATION:       Questionable pulmonary hypertension.              INTERPRETATION:       Velocity measurements are as follows:              RVID (ED) cm:  --  (2.1-3.2 cm) LVPW (ES) cm:  --  (0.9-2.1 cm)        IVS (ED)cm:    1.1 (0.7-1.1 cm) AORTA cm:      3.1 (2.2-3.7 cm)        LVID (ED) cm:  4.3 (4.0-5.6 cm) LVOT cm:       --  (2.0-4.0 cm)        LVPW (ED) cm:  1.0 (0.7-1.1 cm) LA cm:         3.7 (2.5-4.0 cm)        IVS (ES) cm:   --  (0.8-2.0 cm) EF%:           --  (  55-77%)            LVID (ES) cm:  2.5 (2.0-3.8 cm) EPSS mm:       --  (2-7 MM)                          DOPPLER/COLOR FLOW:       MITRAL VALVE:  ACTUAL NORMAL    AORTIC VALVE    ACTUAL NORMAL           V MAX M/SEC:   --     (0.6-1.3) V MAX M/SEC:    --     (1.0-1.7)        T 1/2 M/SEC:   --     (30-60)   LVOT VEL:       --     (0.7-1.1)        VALVE AREA:    --     (4-6 CM)  VALVE AREA:     --     (3-5 CM)         REGURG:        --               REGURG:         --                      PISA:          --               PRES 1/2 TIME:  --                      ERO:           --                AO MAX PG:      --                      RV:            --               AO MEAN PG:     --                                    TRICUSPID VALVE ACTUAL NORMAL    PULMONIC VALVE ACTUAL NORMAL           V MAX M/SEC:    --     (0.3-0.7) V MAX M/SEC:   --     (0.6-0.9)        REGURG:         --               REGURG         --                      RV SYSTOLIC:    --     ([id number])                                          PRESSURE:       --  A complete 2-D, color and Spectral Doppler and M-mode images were       reviewed for interpretation.  The study is technically adequate quality.              LEFT HEART:              1.  Normal left ventricular size with borderline concentric left           ventricular hypertrophy and normal wall motion with ejection fraction                  estimated at 65-70%.       2.  Grade I diastolic dysfunction consistent with impaired           relaxation.  Using E/E prime ratio the left atrial pressure appears           to be within normal limits.       3.  The mitral valve appears pliable with good excursion and is           without any significant stenosis or regurgitation.       4.  The aortic valve leaflets are minimally sclerosed.  There is no           significant aortic stenosis or regurgitation noted.       5.  The left atrium and aortic root appear normal in size.              RIGHT HEART:              1.  Normal right ventricular size and systolic function.  The           right atrium is also normal in size.  The inferior vena cava is           dilated at 2.4 cm with greater than 50% inspiratory collapse           suggesting high normal right atrial pressures.       2.  The tricuspid valve is morphologically normal and is without           any significant stenosis and has trivial insufficiency.  Assuming the           right atrial pressure to be at least 5-10 mmHg, the right ventricular           systolic  pressure is estimated at 28-33 mmHg consistent with only           borderline pulmonary hypertension.  Please note that the lack of good           tricuspid regurgitation jet may underestimate the severity of           pulmonary hypertension.       3.  The pulmonic valve is morphologically normal and is without any           significant stenosis or regurgitation.              PERICARDIUM:              1.  No significant pericardial effusion is noted.              SHUNTING:              1.  No evidence of intracardiac shunting is noted with color Doppler.              CONCLUSION:       1.  Normal  left ventricular size and wall motion with estimated left           ventricular ejection fraction of 65%.       2.  Grade I diastolic dysfunction consistent with impaired           relaxation.       3.  Trivial tricuspid regurgitation with borderline pulmonary           hypertension.                            **PRELIMINARY REPORT UNLESS SIGNED**       SEE DOCUMENT IMAGING SYSTEM FOR FINAL REPORT                                 ________________________________                              Elbert Ewings, MD                                            ID:   [id number]       DocType:   [id number]       \:   TW       /:   [id number]       DD:  10/15/2009 17:12:48       DT:  10/19/2009 10:47:35       JOB: [id number]       >         Authenticated by Elbert Ewings On 10/22/2009 09:49:15 AM

## 2012-08-31 NOTE — ED Notes (Signed)
[facility]                          EMERGENCY DEPARTMENT REPORT              NAME: [name], [name]            DOB:         08/24/1960       MR#:  [id number]                            ACCT#:      [id number]              _________________________________________________________________       DATE OF VISIT:       12/11/2010              TIME OF VISIT:       1135              CHIEF COMPLAINT:       Worsening pancreatitis.              HISTORY:       [name] is a 52 year old white female, whol has a history of       recurrent refractory pancreatitis.  She presents to the       emergency room today with abdominal pain going around to the       back and the right CVA region.  The pain has gradually been       getting worse over 3 days.  She has not had fever.  She states       this feels somewhat different than her usual pancreatitis pain.       However it is similar in character.  The pain does not go       through to the back like normal it is off to the right side.       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       She has had extensive pseudocyst formation and has had drainage.       She has a history of recurrent hypokalemia.  She is status post       cholecystectomy for gallstones.  She has a history of       hypertension, has chronic pain and is on Dilaudid and is not out       of her Dilaudid currently.              SOCIAL HISTORY:       She smokes a pack a day for the last 30 years.  She quit using       alcohol about 4 months ago.  No history of IV drug use.              FAMILY HISTORY:       The patient does not know her family history.              ALLERGIES:       None.              MEDICATIONS:       Please see the nursing notes.  Daily multivitamin, fish oral,       MiraLAX, Nexium, pancrelipase, potassium, Dilaudid and Zofran.              PHYSICAL EXAM:  GENERAL:  Uncomfortable female but stable vital sign wise.       VITAL SIGNS:   Temperature is 99, pulse 86, respiratory rate 16,       BP 146/97.       HEENT:  Normal.       NECK:  Supple.       LUNGS:  Clear.       ABDOMEN:  Shows moderate tenderness without rebound, rigidity,       or mass.       EXTREMITIES:  Extremities are normal.       NEURO:  Normal.              HOSPITAL COURSE AND TREATMENT:              Her white count is elevated at 13.3, hemoglobin 11.3, platelet       count 668,000, 83 polys.  Sodium is low at 132, potassium 3.4.       Calcium is slightly low at 8.4.  Chest x-ray is negative,       partial right lower lobe collapse, mild volume loss left lung       base, nothing acute.  PT 13.1 INR 1.2.  CT scan is quite       remarkable.  There is necrosis of the pancreatic head.  There is       increased ascitic fluid throughout the abdomen and pelvis.       There is persistent distention of the pancreatic duct, likely       related to mass effect from a enlarging pseudocyst in the       pancreatic head.  Pseudocyst communicates with a loculated fluid       collections in the abdomen extending through the porta hepatis       to the posterior aspect of the right hepatic lobe.  There is       mass effect on the portal vein.  There is a new loculated fluid       collection about the right psoas muscle.  Lipase is moderately       elevated at 180.  LFTs normal.              DIAGNOSTIC IMPRESSION:       Acute recurrent pancreatitis with necrosis of the pancreatic       head.              PLAN:       I discussed the case with Dr. Thedore Mins who is on call for [facility]       hospitalist.  The patient has had several admissions here to       [facility] and is getting worse.  She may need a       higher level of care and recommended transfer to the [facility]       of [place] for their opinion.  She may go by private vehicle to       the [facility] [place].  Her CT ______ to them.  Appropriate       medical records are sent with her.  She is transferred in good       condition.  Acute recurrent pancreatitis with necrosis of the       pancreatic head.  I did review the case with Dr. Thresa Ross, and       she can be ER to ER transfer.                                                   **  PRELIMINARY REPORT UNLESS SIGNED**                                SEE DOCUMENT IMAGING SYSTEM FOR FINAL REPORT                                  ________________________________                                                       Andree Moro, MD                                                                                                            ID:   [id number]                                                          DocType:   [id number]       \:   JR                                                                 /:   565       DD:  12/11/2010 15:32:28                                                DT:  12/11/2010 22:34:06                                                JOB: [id number]                                                            >  Authenticated by Cordella Register., MD On 12/13/2010 04:36:00 PM

## 2012-08-31 NOTE — Discharge Summary (Signed)
[facility]                               DISCHARGE SUMMARY         NAME: [name], [name]             ADMITTED:   11/13/2010       MR#:  [id number]                            DISCHARGED: 11/18/2010       DOB:  March 01, 1961                           ACCT#:      [id number]       _________________________________________________________________       DISCHARGE DIAGNOSES:       1.  Recurrent pancreatitis.       2.  Metabolic acidosis.       3.  Hypoalbuminemia.       4.  Gastroesophageal reflux disease.       5.  Constipation.         HOSPITAL COURSE:       This is a 52 year old female with history of pancreatitis and       pseudocyst, who was admitted for nausea and vomiting abdominal       pain for three days duration.  On admission, the patient was       found to have elevated lipase  of 500 level and ultrasound       did not show acute change and then the KUB did not show bowel       obstruction.  The patient was given IV fluid, the pain       medication including Dilaudid for supportive care.  After two       days, the symptoms improved and the patient started to have       clear liquid diet, but the symptoms recurred, so the patient       just went back to n.p.o. after one more day the symptoms       improved now patient tolerated diet without any abdominal pain.       The enzymes have been normalized and the patient had a metabolic       acidosis (associated with nausea and vomiting, but the symptoms       resolved and these acidosis also resolved).  The patient was       found to have hypoalbuminemia with the albumin 1.6, the patient       was given Ensure.  With aggressive IV fluids, the patient       developed some edema in the lower extremities and the IV fluids       was discontinued and ultrasound of lower extremity was done did       not find any DVT.  The fluid overload was relieved by the IV       Lasix x1.         The patient did have refractory constipation and  required       multiple laxatives treatment and finally patient had two large       bowel movements on 11/17/10, on 11/18/10 patient is doing well       and is stable to be discharged home.  DISCHARGE MEDICATIONS:       1.  Dilaudid 4 mg p.o. q.6 h. p.r.n. for pain.       2.  Zofran 4 mg p.o. q.4 h. p.r.n. for pain.       3.  Pancrelipase 5000 units 3 times a day.       4.  Potassium 20 mEq p.o. once a day.       5.  Prilosec 20 mg p.o. once a day.       6.  Reglan 10 mg p.o. 3 times a day.       7.  Dulcolax 10 mg p.o. at bedtime.       8.  Miralax  17 g p.o. daily.         FOLLOWUP:       Patient can follow [facility] 1 to 2 weeks.             **PRELIMINARY REPORT UNLESS SIGNED**       SEE DOCUMENT IMAGING SYSTEM FOR FINAL REPORT           ________________________________                              Theda Sers, MD           ID:   [id number]       DocType:   [id number]       \:   BR       /:   [id number]       DD:  11/18/2010 08:15:40       DT:  11/18/2010 09:40:58       JOB: [id number]       >  Authenticated and Linus Orn by Theda Sers, MD On 11/28/10 4:01:12 PM

## 2012-08-31 NOTE — ED Notes (Signed)
[facility]                          EMERGENCY DEPARTMENT REPORT              NAME: [name], [name]            DOB:         [birth year]       MR#:  [id number]                            ACCT#:      [id number]              _________________________________________________________________       DATE OF VISIT:       03/18/2011.              CHIEF COMPLAINT:       My feeding tube is clogged.              HISTORY OF PRESENT ILLNESS:       This is a 52 year old female who presents to the emergency       department complaining of "my feeding tube is clogged."  She has       a history of chronic pancreatitis.  She is status post       PEG/J-tube placement at the [place] [facility] [place].  She is on       chronic Coumadin therapy.  She is also on chronic Dilaudid.  She       denies any current pain.  She does believe she flushed her       J-tube yesterday, but does note that she has been unable to       unclog it today.  She does note she has attempted to do with       saline.              REVIEW OF SYSTEMS:       No history of any fevers or chills.  No abdominal bloating.  No       history of bleeding.  She denies any drainage.  She denies any       history of any unintended weight loss or night sweats.  All       other review of systems otherwise negative.              ALLERGIES:       PERCOCET.              SOCIAL HISTORY:       She notes positive tobacco.  She no longer drinks.  She does       note she lives ______.  She does drink quite frequently.              SURGICAL HISTORY:       No recent surgeries, other than the recent J-tube/PEG tube       placement.              FAMILY HISTORY:       Negative.              PAST MEDICAL HISTORY:       See HPI, history of chronic pancreatitis.              MEDICATIONS:       Include Coumadin, multivitamin, MiraLAX, Nexium, Pancrelipase,  potassium, Dilaudid.              PHYSICAL EXAMINATION:       VITAL SIGNS:  Temp 97.9, pulse 90,  respiratory rate 18, BP       142/97, pulse ox 100% on room air.  Nurse's notes were reviewed       by myself.       GENERAL:  This is a 52 year old female sitting upright in bed       #21.  She makes good eye contact.  She is interactive.  She is       in no acute apparent distress.  She is reading a book when I       walk in the room.              HEENT:  Sclerae anicteric.  Extraocular movements are intact.       Nares are patent bilateral.  Throat is supple.  Trachea is       midline.  No thyromegaly.  No JVD.       CARDIAC:  Regular rhythm without murmur, rubs, or gallops.       LUNGS:  Clear bilaterally with good air movement.  No wheezes or       stridor.       ABDOMEN:  Soft, nondistended.  Active bowel sounds in all 4       quadrants.  There is a PEG/J-tube intact.  There is no       significant erythema.       PSYCHIATRIC:  No blunting of affect.  No pressured speech.       EXTREMITIES:  Warm.  No appreciable swelling.              IMPRESSION AND PLAN:       A 52 year old female whose presentation is concerning for       clogged PEG tube.  She has no strong evidence to support       dislodgement.  No strong evidence to support abscess or       infectious cause.  My suspicion for ischemia as well as       small-bowel obstruction is relatively low.              ANCILLARY SERVICES:       None.              EMERGENCY DEPARTMENT COURSE AND TREATMENT:       Patient remained stable throughout her stay.  She is ultimately       discharged home.  She did have assessable resolution of the clog       in the PEG tube.  Please see the procedure below.  She was       ultimately discharged home.              PROCEDURE:       Saline was utilized and 60 cc syringe was utilized.  Direct       pressure was applied.  It was flushed without any significant       difficulty.  This was performed by myself.  She tolerated the       procedure well.              DIAGNOSES:       1.  Chronic pancreatitis.       2.  Clogged PEG/J  tube.                                            **  PRELIMINARY REPORT UNLESS SIGNED**                                SEE DOCUMENT IMAGING SYSTEM FOR FINAL REPORT                                                        ________________________________                                        Joesphine Bare, MD                                                                                                              ID:   [id number]                                                          DocType:   [id number]       \:   SR                                                                 /:   408       DD:  03/18/2011 22:14:27                                                DT:  03/19/2011 00:36:16                                                JOB: [id number]                                                            CC:  MEDICAL CLINIC NSV FREE (65)            DR NO REFERRAL (1000)                          >  Authenticated by Joesphine Bare., MD On 05/23/2011 05:55:53 PM

## 2012-08-31 NOTE — H&P (Signed)
[facility]                             HISTORY AND PHYSICAL         NAME: [name], [name]             ADMITTED:   12/15/2009       MR#:  [id number]                            ACCT#:      [id number]       DOB:  03-17-1961       _________________________________________________________________       PRIMARY CARE PHYSICIAN:       None.         ADMITTING PHYSICIAN:       Edmon Crape, MD for [facility]         CHIEF COMPLAINT:       Abdominal pain, nausea, and vomiting.         HISTORY OF PRESENT ILLNESS:       [name] is a pleasant 52 year old Caucasian female with a       history of initially of gallstone pancreatitis, and after that,       alcohol-induced pancreatitis.  The patient also presented to the       emergency room yesterday morning.  She was also diagnosed       pancreatitis.  She stated that on Monday she was drinking       alcohol and she came with the same symptoms abdominal pain,       nausea, vomiting.  She was given pain medication, some Zofran       and Protonix and was sent home, but patient could not keep any       medications down, and she was vomiting all the time.  So she       came back to the emergency room.  I was asked by the ER doctor       to see the patient for evaluation and treatment.  Her lipase and       amylase was elevated and also potassium is low and the patient       also in moderate-to-severe epigastric and abdominal pain due to       the pancreatitis.  The patient actually was admitted last time       with pancreatitis in July and she was discharged from the       hospital July 9.  Right now she denies any chest pain.  She       denies any shortness of breath.  She denies any loss of       consciousness.  No diarrhea.  She did have nausea and vomiting,       but no diarrhea and no fever.         PAST MEDICAL HISTORY:       1.  Gallstone pancreatitis.       2.  Alcoholic pancreatitis.       3.  Asthma.       4.  Surgery,  cholecystectomy due to stones.         ALLERGIES:       NKDA.         MEDICATIONS:       The patient is on:  1.  Prilosec OTC, 1 tablet p.o. daily.       2.  Albuterol 1-2 puffs as needed for shortness of breath.         SOCIAL HISTORY:       She is divorced.  She has 1 son who died in jail, she stated.       She smokes 1 pack a day.  She used to drink a lot, but she       stopped, but she did drink on Monday.  Code is FULL CODE.  She       does not have a primary care.  She works on [employer].         FAMILY HISTORY:         The patient is adopted; she does not know anything about her       biological family.         REVIEW OF SYSTEMS:       GENERAL:  Fatigue, tiredness and weakness.  ENT:  No symptoms.       GI:  Nausea, vomiting, abdominal pain.  No diarrhea.       CARDIOVASCULAR:  No chest pain or palpitations.  RESPIRATORY:       No shortness of breath.  MUSCULOSKELETAL:  No symptoms.  GU:  No       symptoms.  ENDOCRINOLOGY:  Also no symptoms.  CNS:  No symptoms.       SKIN:  Normal, no itching, no rashes.         PHYSICAL EXAMINATION:       GENERAL:  The patient is alert, oriented, cooperative, not in       acute distress.  Lying comfortably at the ER stretcher, hooked       up with the monitors.       VITAL SIGNS:  Her temperature 37.5, heart rate initially was       133, now is 107.  Respiration 18, O2 sat was 95% on room air.       Blood pressure initially was 169/115, now it is 150/90.  Her       weight is 86.4 kg.  Pain was 10/10.       HEENT:  Head is normocephalic, atraumatic.  No signs of erythema       or infection.  Eyes:  Equally reactive to light and       accommodation.  Ears:  Tympanic membranes are normal.  No signs       of erythema or infection.  Mouth:  Oral mucosa is dry.       NECK:  No adenopathy.  No masses.  No thyroid enlargement.  No       JVD.  No bruits.       HEART:  S1, S2 heard.  No murmur.  No gallop.       CHEST:  Decreased air entry bilateral with some late  expiratory       wheezing and slight crackles bilaterally at the lung bases.       ABDOMEN:  Diffuse tenderness on palpation on the abdomen but no       guarding and no rebound.       EXTREMITIES:  No pedal edema.  No calf pain.  Peripheral pulses       +1 bilaterally.       NEUROLOGICAL EXAM:  Cranial 2-12 are intact.  Patient is moving       all her  extremities.       SKIN:  Normal.  No signs of erythema or infection.         DIAGNOSTIC DATA:       Her glucose was 165, sodium 137, potassium 2.9, chloride 94, CO2       was 24, BUN was 11, creatinine 1.65 (usually it is normal).  GFR       33.  Lipase was 2063.  ALT/AST are elevated.  Total bili was       1.3.  White blood cells 21.2, hemoglobin 14.7, hematocrit was       44.3, platelets 310, neutrophils are 90.         ASSESSMENT AND PLAN:       1.  Acute pancreatitis.       2.  Alcohol abuse.       3.  Hypokalemia.       4.  Tobacco abuse.       5.  Asthma/chronic obstructive pulmonary disease.       6.  Nausea and vomiting due to :1.       7.  Abdominal pain also due to :1.         PLAN:       Will admit the patient to medical floor.  Will start the patient       on IV fluids.  Will replace potassium; will add potassium to the       fluids.  Lovenox for DVT prophylaxis.  Dilaudid p.r.n. pain.       Protonix IV and Zofran p.r.n. IV.  Will obtain CMP tomorrow       morning, and lipase and amylase.  Will keep the patient n.p.o.       The patient be admitted to [facility].               **PRELIMINARY REPORT UNLESS SIGNED**       SEE DOCUMENT IMAGING SYSTEM FOR FINAL REPORT         ________________________________                              Edmon Crape, MD           ID:   [id number]       DocType:   [id number]       \:   MD       /:   [id number]       DD:  12/15/2009 02:10:37       DT:  12/15/2009 05:24:04       JOB: [id number]       >  Authenticated by Kathrine Haddock, MD On 12/25/2009 12:22:06 PM

## 2012-08-31 NOTE — H&P (Signed)
[facility]                             HISTORY AND PHYSICAL         NAME: [name], [name]             ADMITTED:   02/25/2010       MR#:  [id number]                            ACCT#:      [id number]       DOB:  24-Jul-1960       _________________________________________________________________       CHIEF COMPLAINT:       Abdominal pain.         BRIEF HISTORY OF PRESENT ILLNESS:       This is a 52 year old female with history of alcoholism and       history of recurrent pancreatitis in the past, initially with       gallstone pancreatitis and subsequently alcoholic pancreatitis,       comes in with abdominal pain.  Patient has had multiple       admissions in the past, the most recent just a couple of months       ago when she was admitted for pancreatitis.  Patient then       subsequently quit drinking for awhile and now has started       drinking again.  The patient was watching TV yesterday when she       started experiencing sudden abdominal pain which was worsening       in intensity, reaching about 10/10 in intensity.  The pain       lasted about 6-1/2 hours prior to the patient coming to the       emergency department.  This pain was associated with nausea and       multiple episodes of yellow bilious vomiting.  Patient also       complained of some chills and recorded a temperature of 100 at       home.   The patient was found to have a lipase       level of 2017, was given Dilaudid and Zofran with relief of pain       and is being admitted again for acute pancreatitis.         PAST MEDICAL HISTORY:       1.  History of gallstones and then chronic pancreatitis.       2.  Asthma.       3.  Alcoholism.         PAST SURGICAL HISTORY:       Cholecystectomy.         ALLERGIES:       No known drug allergies.         FAMILY HISTORY:       Patient is adopted and patient does not know anything about her       biological family.         SOCIAL HISTORY:       Patient is  divorced, has 1 son who passed away.  The patient       smokes about 1 pack a day for the last 25 years.  She has about       a 1 to 2 drinks of vodka daily.  Yesterday she had about 4 to 5       drinks.         MEDICATION LIST:       1.  Prilosec 1 tab OTC.       2.  Albuterol 2 puffs inhaler as needed.         REVIEW OF SYSTEMS:       The patient denied any diarrhea or constipation.  No dysuria.       No chest pain or shortness of breath.  No weakness or numbness.       No heat or cold intolerance.           PHYSICAL EXAMINATION:           VITALS:  Temperature is 36.4, heart rate 79, respirations 20,       saturating 99%, blood pressure is 192/115.       APPEARANCE:  The patient is lying comfortably in bed.       PSYCH:  Alert and oriented x3.  Normal memory.  Normal affect.       EYES:  PERRLA.  Pink conjunctivae.       ENT:  Normal hearing.  Pink oropharynx.  Moist sclerae.       NECK:  Trachea midline.  No thyromegaly.  No JVD.       LYMPH:  No lymph nodes palpable in neck or axilla.       CARDIOVASCULAR:  S1, S2, regular rate and rhythm.  No murmurs.       LUNGS:  Clear to auscultation bilaterally.  No accessory muscle       use.       ABDOMEN:  Soft, generalized tenderness.  No guarding or       rigidity.  Bowel sounds positive.       EXTREMITIES:  No edema.  Pulses palpable.       SKIN:  No rash.  No nodules.  No cyanosis or clubbing.       NEUROLOGICAL:  Cranial nerves 2-12 intact.  No focal motor or       sensory deficits.  No extension tremors.         LABORATORIES:       ALT is 255, AST is 120, ALT is 63, glucose 173, sodium is 142,       potassium is 3.1, chloride is 103, bicarb is 24, BUN is 4,       creatinine is 0.9.  Lipase is 2017.  WBC is 14.3, hemoglobin       14.8, hematocrit 43.5, platelet is 370.         Chest x-ray is pending.         PROBLEM LIST:       1.  Alcoholic pancreatitis.       2.  Hypokalemia.       3.  Leukocytosis.       4.  Uncontrolled hypertension.       5.  Alcoholism.        6.  Smoking.         PLAN:       1.  This is a 52 year old female with history of           pancreatitis in the past who continues to drink, now comes in           with abdominal pain, found to have pancreatitis with lipase  of 2017.  The patient will be admitted to medical floor.  I           will keep the patient n.p.o.  The patient will be managed           with morphine IV for her abdominal pain.  We will start the           patient on intravenous hydration at normal saline of 150           mL/h. as the patient is third-spacing due to pancreatitis,           but would recheck the fluid requirement in 12 hours as the           patient on last echo done in July 2011 did have diastolic           dysfunction, and of note on her admission during that visit           she was aggressively hydrated and subsequently became hypoxic           secondary to bilateral pleural effusions, but as of now           patient's lungs sound clear and she will be able to tolerate           this fluid; however, as stated above the fluid requirement           should be reassessed in a.m.  I do not see any indication for           imaging studies including CAT scan of the abdomen or any           antibiotic currently.       2.  For her hypokalemia, the patient will be repleted with           potassium intravenous and p.o. and we will recheck a             potassium and magnesium level in the a.m.       3.  Leukocytosis is likely secondary to stress from           pancreatitis.  We will send out a UA and a chest x-ray.       4.  Uncontrolled hypertension is likely from pain           secondary to pancreatitis.  We will recheck the blood           pressure and start the patient on hydralazine p.r.n. for           blood pressure of 160.       5.  Alcoholism.  The patient does not seem to be in any           withdrawal at the moment.  Would start the patient on symptom           triggered [facility]draw[facility]  (CIWA)           therapy.  Would also start the patient on thiamine and folic           acid.  The patient was counseled about alcohol cessation.       6.  The patient will be put on nicotine patch for her           smoking.             The patient will be admitted to [facility] and we will follow the  patient through her hospital stay.             **PRELIMINARY REPORT UNLESS SIGNED**       SEE DOCUMENT IMAGING SYSTEM FOR FINAL REPORT         ________________________________                              Stann Ore, MD           ID:   [id number]       DocType:   [id number]       \Kem [place]s       /:   [id number]       DD:  02/25/2010 01:13:25       DT:  02/25/2010 05:54:43       JOB: [id number]       >  Authenticated and Linus Orn by Stann Ore, MD On 02/25/10 2:22:46 PM

## 2012-08-31 NOTE — Discharge Summary (Signed)
[facility]                               DISCHARGE SUMMARY         NAME: [name], [name]             ADMITTED:   02/25/2010       MR#:  [id number]                            DISCHARGED: 02/27/2010       DOB:  Jul 21, 1960                           ACCT#:      [id number]       _________________________________________________________________       PRINCIPAL DIAGNOSES:       1.  Recurrent pancreatitis, likely from alcohol abuse, now           resolved.       2.  Alcohol intoxication.       3.  Hypokalemia.       4.  Uncontrolled hypertension, now on medication.       5.  Probable bilateral community-acquired pneumonia.         HOSPITAL COURSE:       A 52 year old female who was admitted to [facility] on February 25, 2010 with abdominal pain, nausea, and       vomiting.  During hospital stay, patient was diagnosed with acute        pancreatitis, likely from alcohol abuse.  Patient was started on IV fluids       along with pain medication and was kept n.p.o. initially with       improvement in patient's abdominal pain.  Patient's pancreatitis       also improved during hospital stay.  Patient was monitored for       leukocytosis initially, but later on was found to have bibasilar       consolidation on chest x-ray and was started on IV moxifloxacin       with improvement in patient's cough.  Hypokalemia resolved with       potassium supplementation.  Patient was also found to have       uncontrolled hypertension, that was controlled with IV       hydralazine during hospital stay.  Patient was also concerned       about quitting alcohol and given nicotine patch for tobacco       abuse.  Overall, patient continued to improve and was finally       sent home in stable condition.         MEDICATIONS AT THE TIME OF DISCHARGE:       Please see discharge medication reconciliation form.         LABORATORY DATA AT THE TIME OF DISCHARGE:       Patient has unremarkable  BMP except bicarbonate of 21.6.       Patient has unremarkable CBC except hemoglobin of 10.2 with       hematocrit of 30.5 with neutrophil count of 80%.  Lipase at the       time of discharge 676, it came down from 2017.         COMPLICATIONS DURING HOSPITAL STAY:  None.         CONSULTATIONS DURING HOSPITAL STAY:       NONE.         DISCHARGE INSTRUCTIONS:       Patient was discharged home in stable condition, to have follow       up with PCP in [place] in [place] within 1 to 2 weeks for       further medical care.  Patient is counseled about quitting       alcohol and smoking cessation.  Patient is recommended 2-g       sodium diet and also concern about 100% medication compliance.             **PRELIMINARY REPORT UNLESS SIGNED**         SEE DOCUMENT IMAGING SYSTEM FOR FINAL REPORT         ________________________________                              Mauro Kaufmann, MD           ID:   [id number]       DocType:   01TRD       \:   MY       /:   [id number]       DD:  02/27/2010 10:03:51       DT:  02/28/2010 06:39:11       JOB: [id number]            [facility] ([id number])       >  Authenticated and Edited by Rufina Falco, MD Hospitalist On 02/28/10 3:11:51 PM

## 2012-08-31 NOTE — Discharge Summary (Signed)
[facility]                               DISCHARGE SUMMARY         NAME: [name], [name]             ADMITTED:   12/15/2009       MR#:  [id number]                            DISCHARGED: 12/16/2009       DOB:  02/06/1961                           ACCT#:      [id number]       _________________________________________________________________       ADMITTING PHYSICIAN:       Edmon Crape, MD         DISCHARGING PHYSICIAN:       Anastasia Fiedler, MD         DISCHARGE DIAGNOSES:       1.  Acute pancreatitis, alcohol-induced, resolving.       2.  Mild hypokalemia, on potassium supplement.       3.  Leukocytosis due to acute pancreatitis, resolving.       4.  Acute renal failure secondary to dehydration, resolved.         CHRONIC MEDICAL CONDITIONS:       1.  History of pancreatitis in the past.       2.  Alcohol abuse.       3.  Chronic smoker.       4.  Bronchial asthma, bronchitis.             For a detailed history and physical examination, please refer to           Dr. Mauri Brooklyn admission on November 14, 2009.         BRIEF HOSPITAL COURSE HISTORY:       [name] is a 52 year old Caucasian female with a history of       gallstone pancreatitis in the past, then followed by       alcohol-induced pancreatitis , with a history of bronchial asthma.       She was admitted from the emergency room to a medical floor       because of abdominal pain, nausea, vomiting, after she had some       alcohol intake.  Otherwise, she denied any shortness of breath.       She denied any diarrhea, fever, or loss of consciousness.         Pertinent workups: glucose was 165, sodium 137, potassium       2.9, chloride 94, bicarbonate was 24, BUN 11, creatinine 1.65,       and lipase of 2063, total bilirubin 1.3.  WBC count 21.2,       hemoglobin 14.7, hematocrit 44.3.  Patient managed with n.p.o.       initially, IV fluids, pain medication and then patient improved        clinicaly, and  was started on  clear liquid diet and advanced to a        regular diet.  Currently she is feeding and tolerating feeds.  No nausea,        no vomiting. Abdominal pain remarkably  subsided. Current potassium is        3.1, yesterday was 3.7. lipase has been persistently high and yesterday        was 2063, today 1747. Regardless, the patient clinically remained stable,        not complaining any of any abdominal pain.         PHYSICAL EXAMINATION:       VITAL SIGNS:  Currently, the blood pressure is 142/92, heart       rate 95, respiratory rate 20, saturation 98% on room air,       temperature 37 degrees Centigrade.       GENERAL:  Patient appears comfortable.       HEENT:  No icterus.  No pallor.  Moist oral mucosa.       NECK:  Supple.  No lymphadenopathy.       CHEST:  Bilateral air entry.  Clear to auscultation.       CARDIOVASCULAR SYSTEM:  S1, S2 normal.  Regular rate and rhythm.       ABDOMEN:  Bowel sounds heard, normal.  Soft, nondistended.       EXTREMITIES:  No edema.  Peripheral pulses palpable.  No       cyanosis.       CENTRAL NERVOUS SYSTEM:  Alert and oriented x3.         DISCHARGE MEDICATIONS:       1.  Prilosec over-the-counter 1 tablet once a day.       2.  Albuterol 2 puffs inhalation as needed.       3.  Lortab 1 to 2 tablets by mouth every 6 hours as needed.         FOLLOWUP PLANS:       Will follow up with [facility] in 1 week.         DISCHARGE INSTRUCTIONS:       Advised regular diet.  Activity as tolerated.  Advised on       alcohol cessation and smoking cessation.         DISPOSITION:       Home.             **PRELIMINARY REPORT UNLESS SIGNED**       SEE DOCUMENT IMAGING SYSTEM FOR FINAL REPORT         ________________________________                              Anastasia Fiedler, MD           ID:   [id number]       DocType:   [id number]       \:   [place]       /:   [id number]       DD:  12/16/2009 17:05:19       DT:  12/17/2009 12:20:48       JOB: [id number]       >  Authenticated and Linus Orn by Anastasia Fiedler, MD On  12/20/09 7:00:42 AM

## 2012-08-31 NOTE — H&P (Signed)
[facility]                             HISTORY AND PHYSICAL         NAME: [name], [name]             ADMITTED:   11/13/2010       MR#:  [id number]                            ACCT#:      [id number]       DOB:  1960/10/06       _________________________________________________________________       CHIEF COMPLAINT:       Abdominal pain, nausea, and vomiting of three days' duration.         HISTORY OF PRESENT ILLNESS:       Ms. Oguinn is a 52 year old Caucasian woman with a past       medical history of recurrent pancreatitis with pseudocyst status       post drainage, alcohol abuse in the past, asthma, prior history       of recurrent hypokalemia.  Came to our hospital after she       developed abdominal pain, nausea, and vomiting of three days'       duration.  She was working outside putting signs.  At the end of       the day, she went home and she felt tired.  She also developed       constant pain which is more pronounced at the right upper belly       as if a horse kicking her gut, 6/10 in intensity and radiates to       her back.  She vomited 15 -20 times today.  No diarrhea, no       fever and it is pretty much the same kind of pain, nausea, and       vomiting like she had in the past.         PAST MEDICAL HISTORY:       As mentioned in the history of present illness.         PAST SURGICAL HISTORY:       She has multiple cysts aspirations in the past.         ALLERGIES:       She has no allergies.         MEDICATIONS:       1.  Daily multivitamin 1 tablet p.o. daily.       2.  Pancrelipase 24,000 units with meals.       3.  Potassium 20 mEq p.o. daily.       4.  Prilosec 20 mg p.o. daily.       5.  Reglan 10 mg 3 times with meals.       6.  Dilaudid 4 mg p.o. every 3 hours as needed.         SOCIAL HISTORY:       She does not smoke cigarettes and she quit drinking alcohol.         FAMILY HISTORY:       She is adopted and does not know any family history.          REVIEW OF SYSTEMS:       Twelve systems were reviewed and are negative except those  mentioned in the history of present illness.         PHYSICAL EXAMINATION:       GENERAL:  The patient is not in pain, but drowsy due to       medication most likely.       VITAL SIGNS:  Temperature is 37, pulse rate is 90, respiratory       rate 20, blood pressure 128/86, oxygen saturation is 100% on       room air.       HEENT:  Pink conjunctivae, anicteric sclerae.       RESPIRATORY SYSTEM:  No wheeze or crackles.         CARDIOVASCULAR SYSTEM:  S1, S2 well heard.  No murmur or gallop.       ABDOMEN:  She has tenderness at the right upper quadrant.  No       guarding or rebound tenderness.       GENITOURINARY SYSTEM:  No suprapubic tenderness.       MUSCULOSKELETAL SYSTEM:  No edema or deformity.       INTEGUMENTARY SYSTEM:  No rash or ecchymosis.       NERVOUS SYSTEM:  Conscious, alert oriented in time, place, and       person.  No motor or sensory deficits.         LABORATORY INVESTIGATIONS:       Drug screen shows opiates in her urine.  Urinalysis shows       leukocyte esterase 75, WBC 5, bacteria rare, and clarity hazy,       and sodium 132, potassium 3.2, chloride is 99, carbon dioxide       20, BUN 9, creatinine 0.75 and albumin is 1.9, globulin 4.3, ALT       15, AST 15, APTT is 28.2, pro time is 13.4, INR 1.2.  WBC 7.5,       hemoglobin 11.2, hematocrit 32.5, platelet count 535 and lipase       is 458, and abdominal x-ray shows minimal bowel gas, no free       intraperitoneal air.         ASSESSMENT AND PLAN:       Ms. Valley is a well-known patient to [name], came again with       abdominal pain, nausea, and vomiting.  Her lipase is 458,       potassium 3.2, sodium 132.         1.  Acute-on-chronic pancreatitis and my plan is n.p.o.,           admit her to our hospital and NS at 150 cc/hour Dilaudid 1       mg IV 3 hourly p.r.n. and CBC and basic metabolic profile in       a.m.  Continue with her pancrelipase 24,000  with meals.  I       will change Prilosec to Protonix.       2.  Hypokalemia.  Potassium chloride 10 mEq  3 doses and           continue p.o. potassium chloride.       3.  Gastroesophageal reflux disease.  I will start her on           Protonix 40 mg IV daily.       4.  For nausea and vomiting, I will start her on Zofran and           also Reglan, continue with Reglan  p.o.       5.  Deep vein thrombosis prophylaxis with Lovenox.             **PRELIMINARY REPORT UNLESS SIGNED**       SEE DOCUMENT IMAGING SYSTEM FOR FINAL REPORT         ________________________________                              Cruz Condon, MD           ID:   [id number]       DocType:   01TRH       \:   SS       /:   [id number]       DD:  11/13/2010 17:12:09       DT:  11/14/2010 01:32:51       JOB: [id number]       >  Authenticated and Linus Orn by Cruz Condon, MD On 11/19/10 9:03:58 PM

## 2012-09-01 NOTE — Discharge Summary (Signed)
[facility]                               DISCHARGE SUMMARY         NAME: [name], [name]             ADMITTED:   09/05/2011       MR#:  [id number]                            DISCHARGED: 09/18/2011       DOB:  06/02/1960                           ACCT#:      [id number]       _________________________________________________________________       DISCHARGE DIAGNOSES:       1.  Acute recurrent pancreatitis.       2.  History of chronic pancreatitis, had surgery in the           [place], part of the pancreas removed           according to the patient.       3.  Acute pancreatitis with a lipase jumping to  and           then 100 and now it is 150, as pain has decreased.       4.  Hypertension, controlled with labetalol.       5.  Abdominal pain secondary to acute recurrent pancreatitis.       6.  Dehydration.       7.  Constipation, resolved.       8.  History of deep vein thrombosis.  She has an inferior           vena cava filter.  No Coumadin.  According to her, it was           ruled out.  She did not have even deep vein thrombosis.       9.  Gastroesophageal reflux disease.       10.  Chronic pain.         DISCHARGE MEDICATIONS:       1.  Norco 7.5/325 p.o. q.i.d. p.r.n.       2.  Zofran 4 mg p.o. t.i.d. p.r.n.       3.  Labetalol 200 mg p.o. b.i.d. p.r.n.         Medications she was taking at home.         1.  She will continue the multivitamin.       2.  Gabapentin 600 mg three times a day.       3.  Omeprazole 1 tablet daily.       4.  Pancrelipase 5000, 24,000 units three times a day with           meals.       5.  Vitamin D3 two tablets oral once a day.         P.r.n. medication, MiraLax 17 g per dose as needed.         HOSPITAL COURSE:       The patient is a 52 year old female, she was admitted for       abdominal pain.  She does have chronic pancreatitis.  She was  diagnosed with acute recurrent pancreatitis.         Next, seen by the  gastroenterologist.  According to her, she       continue aggressive IV fluid in hope of elevating the       pancreatitis and according to him, there is no reason that she       could not tolerate a higher fluid.  We continue with the fluid,       which did decrease her inflammation.  Her lipase had dropped       down.  There was one fluctuating, increase in lipase to 800 and       then came down to 150.         FOLLOWUP:       Followup a CT scan imaging in 3 to 6 months and no further       workup is needed according to the gastroenterologist.  I did       order the MRI, which shows chronic pancreatitis changes, nothing       acute.  She was sent last time to [facility] [place] by Dr.       Carmelina Noun and she was seen by Dr. Loreta Ave there.  She says that her         pain is still coming on and off and she will go see Dr. Loreta Ave in       [place].  I did explain to her in detail that if       her pain gets worse or nausea, vomiting or diarrhea, she can       come back to emergency department and we will make sure that she       follow up with the [facility] and also with Dr. Carmelina Noun       outpatient within 2 weeks.         TIME SPENT:       Total time spent 55 minutes.                                  **PRELIMINARY REPORT UNLESS SIGNED**                          SEE DOCUMENT IMAGING SYSTEM FOR FINAL REPORT                                            ________________________________                                        Zada Finders, MD               ID:   [id number]       DocType:   01TRD       \:   KR       /:   [id number]       DD:  09/18/2011 17:14:10       DT:  09/19/2011 04:33:32       JOB: [id number]       CC:  PHYSICIAN NO PRIMARY (1002)            DR NO REFERRAL (  1000)         >  Authenticated and Edited by Zada Finders, MD On 09/27/11 5:53:34 PM

## 2012-09-01 NOTE — Discharge Summary (Signed)
[facility]                               DISCHARGE SUMMARY         NAME: [name], [name]             ADMITTED:   07/31/2010       MR#:  [id number]                            DISCHARGED: 08/12/2010       DOB:  12-Jan-1961                           ACCT#:      [id number]       _________________________________________________________________       SERVICES:       [facility].         PRIMARY CARE PHYSICIAN:       Patient does not have a PCP.  She has been recommended to follow       up at the [facility].         CONSULTATIONS:       Timothy K. Clydia Llano, MD, General Surgery.         DISCHARGE DIAGNOSES:       1.  Acute on chronic pancreatitis.       2.  Pancreatic pseudocyst.       3.  Hypokalemia.       4.  Lower extremity edema.       5.  Hypoalbuminemia.       6.  Alcoholism and alcohol withdrawal.       7.  Hypertension.         DISCHARGE MEDICATIONS:       1.  Prilosec over-the-counter 20 mg 1 tablet p.o. daily.       2.  Tramadol 0.5 mg half tablet p.o. q.6 h. p.r.n.       3.  Lasix 20 mg p.o. daily.       4.  Potassium chloride, which is micro, K-Dur 20 mEq p.o.           daily.       5.  Ecotrin 81 mg p.o. daily.       6.  Aldactone 25 mg p.o. daily.       7.  Thiamine 100 mg p.o. daily.       8.  Folic acid 1 mg p.o. daily.       9.  Lopressor 25 mg p.o. b.i.d.       10.  Dilaudid 2 mg p.o. q.8 h. p.r.n.       11.  Lisinopril 5 mg p.o. daily.         ALLERGIES:       No known drug allergies.         PHYSICAL EXAMINATION:       VITAL SIGNS:  Vitals are noted to be stable.  Patient is       afebrile.  Temperature is noted to be 36.7, heart rate 88,       respirations 18, O2 sats 92% on room air, blood pressure       161/107.       GENERAL IMPRESSION:  This is a 52 year old Caucasian female, in       no  acute distress, alert, awake, oriented x3.       HEENT:  Head is atraumatic, normocephalic.  PERRLA.  EOMI.       Sclerae anicteric.  Oropharyngeal  mucosa is moist.       NECK:  Supple.  Full range of motion.  No lymphadenopathy.  No       thyromegaly.  No JVD.       CHEST:  Clear to auscultation bilaterally.  No added sounds.       CARDIOVASCULAR:  S1, S2.  Normal rate.  Rhythm regular.  No       murmurs heard.       ABDOMEN:  Soft, flat, nontender, nondistended.  Bowel sounds       positive.  No organomegaly.         EXTREMITIES:  Pulses positive.  No edema noted.       SKIN:  Intact, no rashes seen.       PSYCH:  Alert, awake, oriented x3.       NEUROLOGICAL:  Grossly intact.         LABORATORIES AND DIAGNOSTIC STUDIES:       Chem-8:  Sodium 136, potassium 3, chloride 96, bicarb 27.5, BUN       3, creatinine 0.66.  Potassium was replaced.  LFTs:  Albumin is       2.5, globulin is 3.4, alk phos 141, ALT 7, AST 18, total bili       0.6.  Amylase and lipase were 458 and 378 respectively.  Lactic       acid levels were within normal limits at 1.8.  CBC within normal       limits.  Hepatitis acute profile was negative for hep A, B, C.         DIAGNOSTIC STUDIES:       1.  CT abdomen and pelvis with contrast showed changes           suggestive of worsening of pancreatitis with multiple new and           enlarging pseudocysts, primarily in the right           retroperitoneum.       2.  Increasing dilatation of the common bile and           pancreatic duct suggesting possible obstruction.  Enlarging           small pleural effusions and dense consolidation was noted in           the right lower lobe.         HISTORY OF PRESENT ILLNESS AND HOSPITAL COURSE:       1.  For detailed HPI, please refer to Dr. Stevenson Clinch note of           July 31, 2010.  In short, [name] is a 52 year old           Caucasian female with past medical history, which is           significant for chronic recurrent pancreatitis, alcohol           abuse, recurrent hypokalemia, who had presented with           significant abdominal pain.  Workup in the ER showed acute on           chronic  pancreatitis with significantly elevated lipase, also           evidence of new and old pseudocysts.  Given these findings,           immediately consultation with General Surgery was sought.           Patient was treated conservatively with IV fluids, pain           medications, bowel rest.  IR consultation was           also obtained for aspiration of pseudocyst.  Patient underwent           successful CT-guided percutaneous aspiration of the right           flank fluid collection.  Over the course of hospitalization,           patient's symptoms stabilized and subsequently improved.  Her           hospital course is also marked by patient receiving doripenem           for a couple of days.  At this point, patient is able to           tolerate diet and has significant improvement in her           abdominal pain.  Her pancreatic enzymes were still elevated,           but patient has chronic pancreatitis.  As already mentioned,           General Surgery Team had been involved in patient's care, but           had recommended conservative treatment, and given significant           improvement in her symptoms at this time, do not think that           she would benefit from any further surgical intervention.       2.  Hypoalbuminemia, this most likely is secondary to           longstanding history of alcoholism with possible liver           decompensation.  Patient was treated with albumin           replacement.  Clinically, she has bilateral pitting edema for           which she also required IV Lasix.       3.  Hypokalemia.  This was secondary to Lasix           administration.  We replaced the potassium levels.  On day of           discharge, patient's potassium level was 3.  This was           replaced with 60 mEq of p.o. KCl.       4.  Longstanding history of alcoholism and alcohol             withdrawal.  Patient during current hospitalization had been       on CIWA protocol to prevent any withdrawal symptoms and  also       was placed on supplementation with thiamine and folate.       Hypomagnesemia seen on the initial lab work.  Magnesium       levels were replaced.       5.  Hypertension.  Patient had been started on metoprolol.           She is on Lasix.  Spironolactone has been added and           lisinopril had been added, further titration will  be per the           discretion of PCP in the outpatient settings.       6.  At this point, patient's condition is stable.  She is           going to be discharged home.         DISCHARGE INSTRUCTIONS:       To follow up at [facility] to establish care within next       5-7 days.         ACTIVITY:       As tolerated.         DIET:       Heart healthy diet.  Patient counseled about alcohol cessation.         TIME SPENT:       Total time spent discharging patient is in excess of 30 minutes.             **PRELIMINARY REPORT UNLESS SIGNED**       SEE DOCUMENT IMAGING SYSTEM FOR FINAL REPORT         ________________________________                              Augustine Radar, MD           ID:   [id number]       DocType:   01TRD       \:   [place]       /:   [id number]       DD:  08/12/2010 15:18:24       DT:  08/13/2010 09:45:31       JOB: [id number]            [facility] [phone])       >  Authenticated and Linus Orn by Augustine Radar, MD On 08/14/10 2:49:43 PM

## 2012-09-01 NOTE — Consults (Signed)
[facility]                                 CONSULTATION         NAME: [name], [name]             ADMITTED:   08/19/2010       MR#:  [id number]                            ACCT#:      [id number]       DOB:  [birth year]       _________________________________________________________________         [facility] ACCESS         DATE OF CONSULTATION:       08/19/2010         REASON FOR CONSULTATION:       Recurrent pancreatitis and pseudocyst.         HISTORY OF PRESENT ILLNESS:       I was called by the [facility] hospitalist service to see this patient       who was just readmitted.  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  In fact,       she had her gallbladder back in 2010, just to see if that would       take that out of equation.  The patient was recently       hospitalized and was noted to have some pseudocysts and ascites       and that was tapped and was negative.  She has been afebrile       since back to 04/23.  She was admitted with increased pain.  In       fact, she was discharged on the 4th on narcotics.  The patient       complains of pain.  She has a very mild leukocytosis of 12,000       but no fever.  She had a repeat CT scan, which demonstrated some       improvement in the ascites, she does have fluid in the right       retroperitoneum around the pancreatic head and neck area and       also down along the right pelvis.  There is no air in this and       there is no real inflammatory changes.  She does have an       elevated lipase.  It is unclear if she is still drinking.  We       were consulted for possible infected pseudocyst and continued       pancreatitis.  The patient has not seen a gastroenterologist       anywhere that I can see in the computer.  She reportedly had       fever and chills.  Her discomfort is in the epigastrium and       periumbilical region.  She has had no nausea, vomiting,       diarrhea,  or constipation.         PAST MEDICAL HISTORY:       Includes alcohol use in the past, but she denies drinking       recently.  Recurrent acute on chronic pancreatitis.  Unknown  pseudocyst.         MEDICATIONS:       At home include:         1.  Lasix 20 mg p.o. daily.       2.  Tramadol 50 mg p.o. q.6 h.  Note, when the patient was           in hospital she was using a fair amount of Dilaudid in the           past and she also was discharged on Dilaudid 2 mg q.8 h.       3.  Lisinopril 5 mg daily.       4.  Lopressor 25 mg b.i.d.       5.  Folic acid.       6.  Thiamine.       7.  Aldactone 25 mg p.o. daily.       8.  Lasix 20 mg p.o. daily.               Of note, the patient is hypokalemic.         ALLERGIES:       None.         SOCIAL HISTORY:       Here in the hospital, the patient is afebrile; although she has       only been here short time.  She is sitting up in the bedside       complaining of pain ______, she did begin to audibly moan when       became ______ was my distinct impression that some of this was       demonstrative.  She does have a little bit dry heaves.         PHYSICAL EXAMINATION:       GENERAL:  She is now with jaundice.  She looks fairly well       hydrated.       VITAL SIGNS:  Fairly normal.       ABDOMEN:  Patient describes discomfort in the epigastrium and       right upper quadrant.  She is nontoxic.  There is mild abdominal       distention.         LABORATORY DATA AND DIAGNOSTIC STUDIES:       Review of the patient's laboratory evaluation, has a white blood       cell count of 12,000.  Her lipase is above 600.  On reviewing       her CT scan, she does have fluid as described.  ______ look like       bland fluid collections.  Some of this had been tapped in the       past and showed no evidence of infection.         IMPRESSION:       The patient has acute on chronic pancreatitis.  She also has       pseudocyst and there is some evidence of pancreatic ascites and        loculation.  I do not think this is infected.  Leukocytosis is       not that impressive and she is afebrile.  If there is concern       about infection, you could have radiology tap these and check       for sure.  This is only likely to be acutely surgical in the  event that she has pancreatic necrosis and that gets infected,       which does not at all appear to be the case.  I would consult       gastroenterology for their suggestions.  It may be that the       patient needs referral to a dedicated pancreatic center such as       [facility] at the [place] [facility] [place].  Again, the patient       does not need emergent surgery and there is little role for       surgery here.  She may need surgical decompression over       pseudocyst, but that is done at a later time and this is a low       volume center for that.  To summarize, I do not think that the       patient needs surgery.  I think it is unlikely that the       pseudocyst are infected.  I will consider referral to a       pancreatic center and also consider consult to Gastroenterology       here.  Right now, I will make her n.p.o.             **PRELIMINARY REPORT UNLESS SIGNED**       SEE DOCUMENT IMAGING SYSTEM FOR FINAL REPORT         ________________________________                              Kurtis Bushman, MD           ID:   [id number]       DocType:   [id number]         \:   [place]       /:   202       DD:  08/19/2010 09:36:26       DT:  08/20/2010 05:45:33       JOB: [id number]       >  Authenticated by Karle Starch., MD On 08/21/2010 03:40:54 PM

## 2012-09-01 NOTE — Consults (Signed)
[facility]                                 CONSULTATION              NAME: [name], [name]             ADMITTED:   08/19/2010       MR#:  [id number]                            ACCT#:      [id number]       DOB:  [birth year]       _________________________________________________________________       DATE OF EXAMINATION:       08/21/2010              CONSULTING PHYSICIAN:       Nolon Stalls, MD              REASON FOR CONSULTATION:       We are asked to see this patient for evaluation of chronic       pancreatitis.              PROBLEMS:       1.  Substance abuse.           a.   Alcohol abuse in the past.           b.   Continued smoking.       2.  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       3.  Status post cholecystectomy.       4.  Chronic pain.       5.  Hypertension.       6.  ______.       7.  Reactive airways disease.              HISTORY OF PRESENT ILLNESS:       The patient is a 52 year old female, who was diagnosed with       pancreatitis about two years ago, this was originally at       [facility].  She had recurrent bouts.  She was last here       at the beginning of May for pancreatitis.  She was not eating       particularly well when she went home, but had recurrent       symptoms.  Her last alcohol was couple of months ago.  She       continues to smoke.  There was no other precipitated cause.  She       has had pseudocyst and drainage.  She did have a low-grade fever       while in the hospital.  She has had no jaundice, acholic stools,       liver disease, fevers, or chills.  There is no other       precipitating causes.              ALLERGIES:       None.              MEDICATIONS:       1.  Pantoprazole IV.       2.  Bicarb.       3.  Dilaudid.       4.  Imipenem, which was started 05/11,  when she was admitted.       5.  Reglan p.r.n.       6.  Lovenox routine.       7.  Spironolactone daily.       8.  Aspirin daily.       9.  Lasix  daily.       10.  Zestril.       11.  Metoprolol.       12.  Protonix orally.       13.  Tramadol.       14.  Thiamine.                     SURGERIES:       As above.              MEDICAL PROBLEMS:       As above.              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  She continues to smoke       about a pack or less a day.  She did drink, but quit.  She lives       with somebody who drinks, works with somebody who chews.              FAMILY HISTORY:       She is adopted, does not know her biologic family.  Her son died       of a drug overdose in prison.              REVIEW OF SYSTEMS:       GENERAL:  Weight stable, energy level fair.  NEUROLOGIC:  No       headaches, numbness, or weakness.  ENT:  Negative.  VISION:       Negative.  CARDIOVASCULAR:  No chest pain with some shortness of       breath.  PULMONARY:  No cough, sputum production, occasionally       wheezing.  GI:  No dysphagia, odynophagia, or heartburn.  Some       diarrhea today.  Right lower quadrant pain and right upper       quadrant pain.  EXTREMITIES:  Without cyanosis, clubbing, or       edema.              PHYSICAL EXAMINATION:       GENERAL:  She is a older than stated age appearing female, in no       acute distress.       VITAL SIGNS:  Temperature 36.7, pulse 98, respirations 18, blood       pressure 133/83, weight 77.2 kilos, height 5 feet 6 inches, BMI       27.       HEENT:  Sclerae anicteric.  Throat clear.  Dentition fair.       NECK:  Supple without thyromegaly.  She has no cervical,       clavicular, or axillary adenopathy.       LUNGS:  Clear to auscultation and percussion.       CARDIAC:  Regular rate and rhythm.  No murmurs, rubs, or       gallops.  PMI nondisplaced.       ABDOMEN:  Bowel sounds present.  Moderately tender in the right       upper quadrant.  No rebound or guarding.  RECTAL:  Not done.       EXTREMITIES:  Without cyanosis, clubbing, or edema.              LABORATORY DATA:       Amylase  normal, glucose 64, sodium 131, bicarb 19.1, albumin       1.6, alk phos 184, AST 161, total protein 5.6, white count 19,       hemoglobin 7.5, platelet count 719, 83% neutrophils, 5% lymphs.       White count is up from admission as is the neutrophil count.       The platelet count is down.  CT shows a pseudocyst formation,       decreased ascites and small right pleural effusion.  Hemoccult       negative.              ASSESSMENT AND PLAN:       Chronic pancreatitis presumably secondary to alcohol.  She has       stopped drinking, but continues to smoke and it is important to       stop.  Presently, she is doing well.  Of concern is the elevated       white count, but we will stop antibiotics as she is afebrile and       see how she does.  Pancreatitis alone can give that though       amylase is normal.  She does have diarrhea and this may              contribute.  We will check stool for C. diff.  As she has been       in and out of the hospital, we will try enzymes.  We will get       imaging of the pancreatic ducts as she has not had an MR.  We do       not have options to stent or drain the pancreas or the       pseudocyst through a cyst gastrostomy, but may benefit from       transfer to [facility].  Presently, she looks stable, so would try       clear liquid diet, and we will see.                            **PRELIMINARY REPORT UNLESS SIGNED**       SEE DOCUMENT IMAGING SYSTEM FOR FINAL REPORT                                 ________________________________                              Nolon Stalls, MD                                            ID:   [id number]       DocType:   01TRC       \:   RY       /:   625       DD:  08/21/2010 10:13:52       DT:  08/21/2010 15:44:05       JOB:  [id number]       >  Authenticated by Patsy Baltimore, MD On 08/30/2010 03:36:51 PM

## 2012-09-01 NOTE — Consults (Signed)
[facility]                                 CONSULTATION         NAME: [name], [name]             ADMITTED:   08/28/2010       MR#:  [id number]                            ACCT#:      [id number]       DOB:  1960-04-24       _________________________________________________________________       CONSULTING PHYSICIAN:       Gwenith Spitz, MD         REASON FOR CONSULTATION:       Acute on chronic pancreatitis.         HISTORY OF PRESENT ILLNESS:       [name] is a very pleasant 51 year old white female, who has       alcoholic pancreatitis.  She actually was just here admitted in       the hospital from May 11th and discharged the 18th.  Dr. Jamelle Haring       saw her at that time and started her on pancreatic enzymes.  She       also had a pseudocyst drainage that admission.  Also, she had a       right thoracentesis for pleural effusion.  She was discharged as       mentioned on the 18th with improvement of her symptoms.  She did       have an MRI of her abdomen that admission, which showed stable       changes associated with presumed pancreatitis with extensive       pseudocyst formation.  She also received blood transfusion.  Her       hemoglobin is 6.4, which is now stable and she was discharged to       home.  She was given Dilaudid 1 mg every 8 hours as needed, and       tramadol 0.5 every 6 hours, and again with the Creon.  Ms.       [name] states that when she got home, she was not controlled on       her pain medication.  On Sunday morning, she had such severe       abdominal discomfort that awoke her from sleep.  She had nausea       and vomiting of yellow, which she called bile.  She then       promptly came to the emergency room.  She was diagnosed with       acute on chronic pancreatitis and GI was consulted for further       evaluation.  Seeing her today, she is definitely better.  Her       nausea and vomiting had subsided.  Her pain is under much better        controlled.  She has had no coffee-ground emesis or frank       hematemesis.  She did have some diarrhea, but no blood.  No       fever.  No chills at this time.         PAST MEDICAL HISTORY:  1.  Recurrent pancreatitis with history of alcohol, history           of extensive pseudocyst formation, has history of drainage.       2.  History of alcohol abuse, quit four months ago per           patient.       3.  Reactive airways disease.       4.  History of recurrent hypokalemia.       5.  Status post cholecystectomy for gallstones.       6.  Ongoing tobacco abuse, patient had pneumonia on last           admission.       7.  Hypertension.       8.  Chronic pain issues.         SOCIAL HISTORY:       The patient continues to smoke a pack a day for over the last 30       years.  She quit using alcohol about four months ago.  She has       been using for about nine years.  Denies IV drug use.         FAMILY HISTORY:       Noncontributory.           ALLERGIES:       There are no known drug allergies.         HOME MEDICATIONS:       1.  Aldactone 25 mg daily.       2.  B complex 1 mg daily.       3.  Colace 100 mg b.i.d.       4.  Dilaudid 1 mg every 8 hours as needed.       5.  Aspirin 81 mg daily.       6.  Ferrous sulfate 325 t.i.d.       7.  Lasix 20 daily.       8.  Lisinopril 5 daily.       9.  Lopressor 25 b.i.d.       10.  Pancrelipase 5000 t.i.d. with meals.       11.  Potassium 20 mEq daily.       12.  Prilosec daily.       13.  Thiamine 100 mg daily.       14.  Tramadol 0.5 oral every 6 hours as needed for pain.         REVIEW OF SYSTEMS:       Again, she denies any fever or chills.  She did have some       nausea, vomiting, however, this has subsided.  She denies       recurrent alcohol use.  She states that she believes the attack       was brought on by ingesting a fruit.  She believes that the       sugar may have exacerbated her pancreatitis.  She also believes       that she went home on suboptimal  pain control and now was the       main reason that she presented, was uncontrolled pain.  Review       as above otherwise negative.         PHYSICAL EXAMINATION:       VITAL SIGNS:  Please refer the nursing notes.       GENERAL:  She is a 52 year old white female, who appears older       than her stated age, but currently in no acute distress.       SKIN:  Pink, warm, and dry.       HEENT:  Pupils are equal and reactive to light.  Extraocular       motions intact.  Eyes are anicteric.  Oropharynx is clear.       Tongue is midline.  Speech is normal.  Hearing is intact.       NECK:  No lymphadenopathy or thyromegaly.       LUNGS:  Clear to auscultation bilaterally with no crackles,       rhonchi, or wheezing.       CARDIAC:  Regular rate and rhythm without murmur, rub, or       gallop.       ABDOMEN:  The abdomen overall is soft, but is tender in the       epigastric region.       EXTREMITIES:  There is no significant pedal edema.       NEUROLOGICAL:  She is conscious, alert, oriented x3.  No gross       neuro deficit.         LABORATORY DATA:       Today, her lipase is 237, amylase 620, INR 1.5.  White count is       14, hemoglobin 10.9, hematocrit 33.1, platelets are down to 845,       MCV is 91.  Liver profile shows albumin pretty low at 1.8.  Alk       phos is 183, ALT 40, AST 38, total bilirubin 0.7, direct       bilirubin 0.5, total protein again 5.5, which is low.  Chem-8       showed sodium 134, potassium 3.4, BUN 11, creatinine 0.79.       Lactic acid was 1.4.  Her blood cultures are in progress.  So       far, culture of the urine is negative.           IMPRESSION:       1.  Acute on chronic alcoholic pancreatitis.       2.  Extensive pseudocyst formation with recent drainage.       3.  Leukocytosis.       4.  Status post cholecystectomy for gallstones.       5.  Recurrent hypokalemia.       6.  Tobacco abuse with reactive airways disease.       7.  Hypertension.         PLAN:       The patient has been  admitted and started on Zosyn for her       elevated white count.  She was continued on her Creon.  Dr.       Carmelina Noun has seen and evaluated the patient and has spoken with the       radiologist about her last MRCP.  It was not clear if an ERCP       was performed at the time of her cholecystectomy.  At any rate,       the patient will have another MRCP today.  Further       recommendations will be made pending the diagnostic studies and       the patient's course.         THE ABOVE DICTATED  BY Rebeca Alert, PA-C         ADDENDUM DICTATED BY Dorothea Ogle, MD, 08/30/2010 09:11:       The patient was seen and examined by me at approximately 15:30       on 08/29/2010.  She suffers from acute on chronic pancreatitis       that is complicated by development of pseudocyst, located not       only within the pancreas but also scattered throughout her       peritoneal cavity.  Her pancreatic pseudocystic disease is       related to underlying chronic pancreatitis, the most likely       cause of which is related to alcohol.  There is a small       possibility that this may be gallstone pancreatitis.  Of note,       she had cholelithiasis at the very first episode of       pancreatitis.  She underwent a cholecystectomy.  She is       continued to have bouts of pancreatitis since.  No       intraoperative cholangiogram was done at the time of       cholecystectomy and current imaging suggests that there may be       some filling defects in the biliary tree.  However, the pattern       of course of her illness is much more strongly suggestive of       alcoholic and not biliary pancreatitis.  Regardless of the cause       of this admission; it was mostly because of inability to secure       adequate pain medications.  Apparently, after a recent       discharge, she was given only four 1 mg Dilaudid pills.  When       her pain increased, she presented back to medical attention.       Because of her slight elevated white count, she  was admitted but       clinically she was actually somewhat better than she was when       she was discharged recently.  At the present time, I see no       reason for any intervention.  The only open-ended issue remains       whether or not she has had gallstone disease producing or at least       contributing to her pancreatitis; the likelihood of this is       small.  A plan, therefore, is to wait for the results of the       MRCP.  Her diet could be safely advance when she has completed       the MRCP.  She does need a pain management.  After completion of       above, she will likely be able to be discharged to home.         REV SG, 08/30/2010, 1505 HOURS.             **PRELIMINARY REPORT UNLESS SIGNED**       SEE DOCUMENT IMAGING SYSTEM FOR FINAL REPORT                            The above dictated by Rebeca Alert, PA-C       ________________________________  Gwenith Spitz, MD           ID:   [id number]       DocType:   Celestia Khat       \:   AY       /:   412       DD:  08/29/2010 16:08:32       DT:  08/30/2010 03:17:20       JOB: [id number]       >  Authenticated and Edited by Gwenith Spitz., MD On 08/31/10 7:46:31 AM

## 2012-09-01 NOTE — Discharge Summary (Signed)
[facility]                               DISCHARGE SUMMARY         NAME: [name], [name]             ADMITTED:   10/03/2010       MR#:  [id number]                            DISCHARGED: 10/07/2010       DOB:  1960/06/20                           ACCT#:      [id number]       _________________________________________________________________       PRINCIPAL DIAGNOSES:       1.  Recurrent pancreatitis with pseudocyst.       2.  Right loculated pericolic fluid collection, status post           IR drainage.         HOSPITAL COURSE:       A 52 year old female, who was admitted to [facility] on October 03, 2010, with complaints of diffuse abdominal       pain along with nausea and vomiting.  For admission details, see       H and P dictated on October 03, 2010.  During the hospital stay,       the patient underwent CAT scan of abdomen and pelvis that showed       loculated right-sided pericolic fluid collection along with       findings consistent with pancreatitis.  The patient was started       on symptomatic treatment with IV fluids, pain medication, and no       food by mouth.  Surgery was consulted and the patient underwent       IR-guided tube drainage of right pericolic fluid collection.       Fluid studies did not show presence of any infection.       Gastroenterology was consulted and the patient was recommended       to have outpatient followup at [facility] for further stent placement.       The patient continued to improve and tolerated food during later       stay in the hospital and was finally sent home in stable       condition.         MEDICATION AT THE TIME OF DISCHARGE:       The patient was continued on all her home medications without       any change.         NEW MEDICATION AT THE TIME OF DISCHARGE:       1.  Creon 24,000-unit capsule 1 capsule 3 times a day with           meals.       2.  Reglan 10 mg by mouth 3 times a day p.r.n. nausea and            vomiting.         LABORATORY DATA:       At the time of discharge, the patient has unremarkable BMP,       except BUN of  2.  The patient has unremarkable CBC.       Lipase 308.         COMPLICATIONS DURING HOSPITAL STAY:       None.         CONSULTATIONS DURING HOSPITAL STAY:       Dr. Ignacia Palma from Gastroenterology and Dr. Odis Luster from General       Surgery.         DISCHARGE INSTRUCTIONS:       The patient was discharged home in stable condition to have       [facility] for further medical care       and follow up with Gastroenterology in 1-2 weeks for outpatient       setup of [facility].  Gastroenterology Clinic appointment for further       possible evaluation for her recurrent pancreatitis.  The patient       is also recommended to follow with [facility]         [facility] for possible removal of pseudocyst       drainage tube.  The patient is recommended a Heart Healthy diet       and activity as tolerated.             **PRELIMINARY REPORT UNLESS SIGNED**       SEE DOCUMENT IMAGING SYSTEM FOR FINAL REPORT         ________________________________                              Mauro Kaufmann, MD           ID:   [id number]       DocType:   01TRD       \:   YR       /:   [id number]       DD:  10/07/2010 15:24:48       DT:  10/08/2010 06:50:59       JOB: [id number]            [facility] [phone])       CC:  Crissie Reese, MD [phone])            [facility] [phone])            [facility] [phone])       >  Authenticated and Edited by Rufina Falco, MD Hospitalist On 10/08/10 5:41:52 PM

## 2012-09-01 NOTE — Consults (Signed)
[facility]                                 CONSULTATION              NAME: Autumn, [name]             ADMITTED:   10/03/2010       MR#:  [id number]                            ACCT#:      [id number]       DOB:  05/10/1960       _________________________________________________________________       DATE OF CONSULTATION:                     REQUESTING PHYSICIAN:       Rufina Falco, MD              CONSULTING PHYSICIAN:       Chauncey Fischer, MD              REASON FOR CONSULTATION:       Pancreatitis.              HISTORY OF PRESENT ILLNESS:       [name] is a very pleasant, 52 year old white female,       who is well known to our service, as she has had multiple       admissions for acute on chronic pancreatitis.  She has had       pancreatitis with pseudocyst in the pancreas as well as       scattered throughout her peritoneal cavity.  Her pancreatitis is       related most likely to alcohol; however, she has had       pancreatitis secondary to gallstones, and has undergone a       cholecystectomy.  There was no intraoperative cholangiogram.       She possibly has biliary filling defects, but the picture is       most consistent with alcohol.  She has had multiple admissions       recently and has had pseudocyst drainage.  She is admitted this       time again for nausea, vomiting, anorexia, and abdominal       discomfort and CT shows acute pancreatitis with again a fluid       collection.  She has had this drained and the drain remains.  GI       consultation was requested for further management of her       pancreatitis and further recommendations.              PAST MEDICAL HISTORY:       1.  Pancreatitis, as above.       2.  Asthma.       3.  History of pleural effusion status post drainage.       4.  Anemia.       5.  Tobacco abuse.       6.  Hypokalemia.              SOCIAL HISTORY:       She does smoke.  She no longer uses alcohol.  She is from        [place], [place].  FAMILY HISTORY:       She is adopted.              ALLERGIES:       She has no known drug allergies.              MEDICATIONS:       Medications at home:  Multivitamin, potassium, Prilosec, and       Dilaudid.                     REVIEW OF SYSTEMS:       As above, otherwise negative.              PHYSICAL EXAMINATION:       VITAL SIGNS:  Please refer to the nursing notes.       GENERAL:  She is in no acute distress.       SKIN:  Pink, warm, and dry.       HEENT:  Pupils are equal and reactive to light.  Extraocular       movements are intact.  Eyes are anicteric.  Oropharynx is clear.       Tongue is midline.  Speech is normal.  Hearing is intact.       LUNGS:  Clear to auscultation bilaterally.  No respiratory       effort.       CARDIAC:  Regular rate and rhythm.       ABDOMEN:  Soft and nontender to palpation.  Positive bowel       sounds.  Drain is present.       EXTREMITIES:  No edema.  Normal range of motion.       NEURO:  She is conscious, alert, and oriented x3.  No gross       neuro deficit.              LABORATORY DATA:       Her LFTs are okay.  Her lipase was 703, today was 308.              IMPRESSION/PLAN:       Acute on chronic pancreatitis with pseudocyst formation status       post drainage.  I have discussed this patient with Dr. Carmelina Noun.       She can go home with the drain to be managed by Interventional       Radiology.  She is to follow up with [facility], and       when she can, she should be put back on the Creon.  We will also       make a referral to [facility] for further opinions regarding her       pseudocyst and if an endoscopic ultrasound would be useful for       treatment of the pseudocysts.  I will also speak with Dr.       Ignacia Palma, as he is the physician on call, regarding further       plans.                            **PRELIMINARY REPORT UNLESS SIGNED**       SEE DOCUMENT IMAGING SYSTEM FOR FINAL REPORT                          The above  dictated by Rebeca Alert, PA-C  ________________________________                              Chauncey Fischer, MD                                            ID:   [id number]       DocType:   [id number]       \:   AY       /:   [id number]       DD:  10/07/2010 13:16:31       DT:  10/07/2010 23:32:51       JOB: [id number]       >  Authenticated by Chauncey Fischer., MD On 10/08/2010 02:45:50 AM

## 2012-09-01 NOTE — Discharge Summary (Signed)
[facility]                               DISCHARGE SUMMARY              NAME: [name], [name]             ADMITTED:   07/12/2010       MR#:  [id number]                            DISCHARGED: 07/16/2010       DOB:  06-13-1960                           ACCT#:      [id number]       _________________________________________________________________       PRIMARY CARE PHYSICIAN:       Patient has no PCP.              DISCHARGE DIAGNOSIS:       Acute on chronic pancreatitis, hypokalemia now resolved, acute       renal failure now resolved, elevated transaminases now       improving, diarrhea resolved.              HOSPITAL COURSE:       A 52 year old female was admitted to [facility]       with complaints of numbness and cramping in her extremities as       well as diarrhea.  For admission details, please see H and P       dictated on July 14, 2010.  During hospital stay, patient was       found to have severe hypokalemia that improved with aggressive       IV and oral potassium supplementation.  Patient was also found       to have acute on chronic pancreatitis that improved with IV       hydration and n.p.o. status initially.  Patient's acute renal       failure improved with hydration.  Patient also underwent       hepatitis panel for elevated transaminases that was found to be       negative.  Patient continued to improve clinically during       hospital stay and was finally sent home in stable condition.              MEDICATION AT THE TIME OF DISCHARGE:       Patient was continued on home dose of Prilosec.              NEW MEDICATION AT TIME OF DISCHARGE:       Ultram 25 mg by mouth every 6 hours as needed for pain.              LABORATORY RESULTS AT THE TIME OF DISCHARGE:       Patient has unremarkable BMP except bicarbonate of 21.3, BUN of       30, alkaline phosphatase 203, ALT 54, AST 52.  Patient has       unremarkable CBC except hemoglobin of 9.7 with  hematocrit of       29.1.  Hepatitis panel A, B, and C is negative.              CONSULTATIONS DURING HOSPITAL STAY:  None.              COMPLICATIONS DURING HOSPITAL STAY:       None.              DISCHARGE INSTRUCTIONS:       Patient was discharged home in stable condition to have followup       with [facility] in one week for further medical care.       Patient is recommended a heart healthy diet and activity as       tolerated.                            **PRELIMINARY REPORT UNLESS SIGNED**       SEE DOCUMENT IMAGING SYSTEM FOR FINAL REPORT                                 ________________________________                                     Mauro Kaufmann, MD                                            ID:   [id number]       DocType:   [id number]       \:   BY       /:   [id number]       DD:  07/16/2010 12:17:54       DT:  07/17/2010 06:17:50       JOB: [id number]            [facility] [phone])            [facility] ([id number])       >  Authenticated by Rufina Falco, MD Hospitalist On 07/18/2010 03:57:26 PM

## 2012-09-01 NOTE — H&P (Signed)
[facility]                             HISTORY AND PHYSICAL         NAME: Autumn, [name]             ADMITTED:   08/28/2010       MR#:  [id number]                            ACCT#:      [id number]       DOB:  1960/04/29       _________________________________________________________________       CHIEF COMPLAINT:       Sharp intractable abdominal pain without back pain but also       intractable nausea and vomiting for one day.         HISTORY OF PRESENT ILLNESS:       This is a kindly 52 year old female who was just recently       discharged for acute-on-chronic pancreatitis.  The patient does       note having ongoing tobacco abuse.  Once discharged on 05/18,       had apparently eaten a grilled cheese and some yogurt that was       recommended but probably with some fruit.  The patient as well       denies any alcohol use over the last four months.  The patient       otherwise notes her nausea and vomiting to be bilious in nature       without any coffee grounds or frank hematemesis.  Her diarrhea       is watery but without any evidence of melena or hematochezia.       The patient as well denies any dysuria or hematuria at this time       or frank chills.  The patient denies any chest pain, shortness       of breath or cough at this time.         PAST MEDICAL HISTORY:       1.  Recurrent-on-chronic pancreatitis with a history of           pseudocyst.       2.  Alcohol abuse in the past, having quit four months ago.       3.  Asthma.       4.  History of recurrent hypokalemia.       5.  Prior cholecystitis in January 2010.       6.  History of possible seroma to the left hepatic lobe per           abdominal ultrasound in the past.         PAST FAMILY HISTORY:       Patient was adopted so unknown family history.         SOCIAL HISTORY:       Notes ongoing tobacco use of a pack a day over the last 30 years       with intent to quit.  The patient does note heavy alcohol  use in       the past over the last nine years but no alcohol use in the last       four months.  The patient denies drug use or IV drug use.  REVIEW OF SYSTEMS:       Ten-point review of systems negative except as in HPI.         ALLERGIES:       No known drug allergies.         MEDICATIONS:       On admission:         1.  Aldactone 25 mg daily.       2.  Vitamin B complex daily.       3.  Ecotrin 81 mg daily.       4.  Ferrous sulfate 325 mg t.i.d.       5.  Lasix 20 mg daily.       6.  Lisinopril 5 mg daily.         7.  Lopressor 25 mg b.i.d.       8.  Creon 24,000 units t.i.d. with meals.       9.  Protonix 20 mg daily.       10.  Thiamine 100 mg daily.       11.  Colace 100 mg b.i.d.       12.  Dilaudid mg every 8 hours p.r.n.       13.  Ultram and 25 mg every 6 hours p.r.n.         PHYSICAL EXAMINATION:       VITAL SIGNS:  Temperature 97.5, pulse 100, respiratory rate 18,       blood pressure 146/96, pulse ox 98% on room.       HEENT:  Pupils equal and reactive to light.  Extraocular       movements intact.  Head atraumatic, normocephalic. Ears have  no       visible external auditory canal drainage or lesions.  Nares note       no nasal congestion, nasal drainage, lesions bilaterally.  Oral       cavity is moist without oral lesions.       NECK:  Supple.  No JVD.  No carotid bruits auscultated.       HEART:  S1, S2.  No murmurs.  Regular rate and rhythm.       RESPIRATORY:  Clear to auscultation bilaterally without rales,       rhonchi, or wheezes audible.       BACK:  Notes no palpable vertebral or CVA tenderness.       GI:  Soft with epigastric area significant discomfort and some       right upper and right mid-quadrant discomfort but no right lower       discomfort.  No significant guarding or rigidity at this time.       Bowel sounds positive in all quadrants.       EXTREMITIES:  Lower extremities note no edema, cyanosis,       clubbing.  Dorsal pedal pulses +1 bilaterally.       NEUROLOGIC:  Exam  is grossly nonfocal.  Cranial nerves 2-12       intact.  No motor or sensory deficits elicitable on physical       exam.       MUSCULOSKELETAL:  Adequate range of motion all extremities.       PSYCHIATRIC:  Alert, oriented x3.       GENERAL:  This is female appearing stated age without any       obvious cardiac or respiratory distress but only some ongoing  intermittent abdominal sharp pain with nausea and vomiting at       this time.         LABORATORY DATA:       Glucose 92, sodium 136, potassium 3.6, chloride of 98, bicarb of       22, BUN of 6, creatinine of 0.7, calcium is 9.2.  GFR is greater       than 60.  Lipase is currently 390.  Albumin is 2.3, alk phos is       231, ALT is 49, AST is 71, total bilirubin 0.7, direct bilirubin       0.4.  WBC count of 16.6, hemoglobin of 11.9, hematocrit of 35.4,       platelets are 1,114,000, neutrophils 88%, bands are 1%.  Prior       C. diff culture was negative on 08/23/2010.  Fluid culture on       08/19/2010 of right lower quadrant was negative.         A recent ultrasound of the bilateral lower extremity legs showed       no evidence of DVT in either leg.         A right thoracentesis was done showing 75 mL of yellow       translucent fluid drawn.         The prior MRI of the abdomen on 08/22/2010 shows stable changes       with presumed pancreatitis with extensive pseudocyst formation       and subcapsular liver fluid collection, ascites involvement of       the duodenum and ______ area of the left adrenal vein, apparent       filling defects in the common bile duct that is likely due to       extra-luminal changes which can be followed in 3-6 months to         evaluate for stability.  There is a stable right pleural       effusion and right lower lobe consolidation also noted at that       time.         Recent chest x-ray on 08/25/2010 shows just a status post right       thoracentesis without complication.         ASSESSMENT:       This is an unfortunate  52 year old female who notes history of       acute-on-chronic pancreatitis with pseudocyst formation, status       post drainage of the pseudocyst on 05/11 and MRCP on 05/14,       which showed stable changes at this time.  The patient is noted       to have the pain ongoing and slight worsening, however, liver       function tests with elevated bilirubin as well as a significant       white count with evidence of ongoing inflammation.         PLAN:       1.  Acute-on-chronic pancreatitis.  Rule out infected           pseudocyst versus mild common bile duct obstruction.  Get a           repeat MRCP to assess and a GI evaluation with empiric Zosyn           IV antibiotics to be given at this time.  Would keep the  patient n.p.o. with continuation of her Creon and only ice           chips to be given at this time.  Would support the patient           with IV fluids.   Would possibly check a CA 27-29 to           additionally assess.       2.  Intractable abdominal pain, nausea, vomiting, most           likely related to her pancreatitis issues.  Will again follow           up with an MRCP and give Zofran and possibly Dilaudid for           pain control if needed versus Ultram.       3.  Hypertension.  Continue patient's lisinopril,           Lopressor and the Lasix at this time only if potassium levels           remain within acceptable limits.  Otherwise would hold Lasix           at this time.       4.  History of alcohol abuse.  As LFTs are elevated, would           check alcohol level and recommend no further alcohol use in           her lifetime.  Will continue thiamine and folic acid           supplementation.       5.  Leukocytosis with thrombocytopenia most likely related           to infection and/or inflammation.  Again will empirically           start IV Zosyn at this time.  Would monitor CBC on a daily           basis along with her lipase levels and amylase as well as           liver function  tests.       6.  Prophylaxis with Protonix IV every 12 hours and lower           extremity compression devices.         TOTAL TIME SPENT:       Thirty-five minutes.         PRIMARY CARE PHYSICIAN:       ______.             **PRELIMINARY REPORT UNLESS SIGNED**       SEE DOCUMENT IMAGING SYSTEM FOR FINAL REPORT         ________________________________                              Aundria Mems, MD             ID:   [id number]       DocType:   [id number]       \:   UA       /:   [id number]       DD:  08/28/2010 21:41:11       DT:  08/29/2010 07:27:33       JOB: [id number]       >  Authenticated by Elenor Quinones On 08/29/2010 09:07:55 PM

## 2012-09-01 NOTE — Discharge Summary (Signed)
[facility]                               DISCHARGE SUMMARY         NAME: VITORIA, CONYER             ADMITTED:   10/08/2011       MR#:  [id number]                            DISCHARGED: 10/13/2011       DOB:  November 30, 1960                           ACCT#:      [id number]       _________________________________________________________________       PRINCIPAL DIAGNOSES AT ADMISSION:       1.  Acute on chronic pancreatitis.       2.  Hypokalemia.         PRINCIPAL DIAGNOSES AT DISCHARGE:       1.  Acute on chronic pancreatitis.       2.  Vancomycin-resistant Enterococci urine colonization.       3.  Hypokalemia.         ADDITIONAL DIAGNOSES EVALUATED AND TREATED DURING ADMISSION:       Enterococcal bacteremia, Clostridium difficile colitis, venous       thrombosis, status post inferior vena cava.         HISTORY OF HOSPITAL COURSE:       Ms. Leatherwood is a 52 year old female with a past medical history       of alcohol associated pancreatitis and then alcohol associated       recurrent pancreatitis.  She came to our hospital with nausea,       vomiting and abdominal pain of one day duration.  Her lipase       during  admission was 755.  We admitted her with a working       diagnosis of acute on chronic pancreatitis and treated her with IV       fluids and pain medications.  The patient was found to have VRE       in her urine, which is between 10,000 and 50,000 and we treated       her with Zyvox for three days to decolonize the patient.  She       has been on vancomycin for Enterococcus, which grew in her blood       before her admission and that Enterococcus is sensitive to       vancomycin.  I personally do not believe, this       vancomycin-resistant Enterococcus is an extension of her       bacteriemia with vancomycin-sensitive Enterococcus. These are       different strains, one is vancomycin resistant and the previous       one is vancomycin sensitive.  So, basically this  is colonization       of her urine and I will treat her for three days to decolonize       her.  She has no any fever, chillness or rigors at this time.       Her abdominal pain is getting better and we started her on a       clear liquid diet and advanced  her to regular diet.  She       tolerated her diet.  No nausea or vomiting at this time and will       be discharged home.         PHYSICAL EXAMINATION:       GENERAL:  The patient is sitting on her bed without any       cardiopulmonary distress.       VITAL SIGNS:  Temperature is 36.9, pulse rate is 77, respiratory       rate 18, blood pressure 137/93, oxygen saturation is 95% at room       air.       HEENT:  Pink conjunctivae.  Anicteric sclerae.       RESPIRATORY SYSTEM:  No wheeze or crackles.       CARDIOVASCULAR SYSTEM:  S1, S2 well heard.  No murmur or gallop.       ABDOMEN:  She has a surgical scar below her umbilicus, slight       tenderness at the right lower quadrant, but no regarding or       rebound tenderness.       GENITOURINARY SYSTEM:  No suprapubic tenderness.       MUSCULOSKELETAL SYSTEM:  No edema or deformity.       INTEGUMENTARY SYSTEM:  No rash or ecchymosis.         NERVOUS SYSTEM:  Conscious, alert, oriented in time, place, and       person.  No motor or sensory deficits.         LABORATORY INVESTIGATIONS:       Glucose is 116, sodium 139, potassium 3.6, chloride is 105,       carbon dioxide 24.6, BUN is 5, creatinine 0.68.         MEDICATIONS AT TIME OF DISCHARGE:       1.  Zyvox 600 mg p.o. b.i.d. for two days.       2.  Daily multivitamin one tablet p.o. daily.       3.  Nexium 40 mg p.o. daily.       4.  Norvasc 5 mg 2 tablets once daily.       5.  Pancrelipase 24,000 units three times daily with meals.       6.  Vitamin D3 two tablets p.o. daily.       7.  MiraLax 17 g one powder p.o. as needed.       8.  Norco 10 x 325, one tablet p.o. every 6 hours as needed.       9.  Zofran 4 mg p.o. 4 times daily as needed.         DISCHARGE  PLAN:       Activities as tolerated.         DIET:       Regular diet.         FOLLOWUP:       Follow up at [facility] within four days.  Issues that       needs to be followed is her acute on chronic pancreatitis, CAD.         TIME SPENT:       Time spent during discharge 40 minutes.  A face-to-face       encounter was made during discharge.                                  **  PRELIMINARY REPORT UNLESS SIGNED**                          SEE DOCUMENT IMAGING SYSTEM FOR FINAL REPORT                                            ________________________________                                        Cruz Condon, MD               ID:   [id number]       DocType:   [id number]       \:   KR       /:   [id number]       DD:  10/13/2011 10:43:12       DT:  10/14/2011 01:48:14       JOB: [id number]       CC:  PHYSICIAN NO PRIMARY ([id number])            DR NO REFERRAL ([id number])         >  Authenticated and Edited by Cruz Condon, MD On 10/19/11 8:02:11 AM

## 2012-09-01 NOTE — H&P (Signed)
[facility]                             HISTORY AND PHYSICAL              NAME: [name], SOLLERS             ADMITTED:   10/08/2011       MR#:  [id number]                            ACCT#:      [id number]       DOB:  02-16-1961       _________________________________________________________________       CHIEF COMPLAINT:       Nausea, vomiting and abdominal pain for one day.              HISTORY OF PRESENT ILLNESS:       This is a 52 year old female with alcohol abuse, alcohol       associated chronic pancreatitis, recurrent pancreatitis.  Recent       admission with also enterococcus bacteremia who was discharged       on 09/28/2011.  At time of discharge, patient was doing fine       until this morning about 3 o'clock patient woke up with nausea,       vomiting and severe abdominal pain.  Patient vomited about three       or four times, non-bloody vomitus, non-biliary.  Patient had       significant abdominal pain more on the right upper quadrant and       radiating to the back and there was constant pain, 8/10 in       severity more with sitting and moving around.  Patient did not       have chills, fever, did not have diarrhea.  Patient denied any       alcohol drinking since Memorial weekend of this year and patient       denied any suspicious food intake.  In the ER, patient was found       to have lipase 755 and patient was found to have mild       leukocytosis but no fever.  Patient will be admitted for acute       and chronic pancreatitis.  Patient was given Dilaudid for pain       and Zofran IV.  Patient is now somewhat better, still nauseous.              ALLERGIES:       PERCOCET.              PAST MEDICAL HISTORY:       1.  Recurrent acute and chronic pancreatitis, likely           related to alcohol.       2.  Pancreatic pseudocyst.       3.  Partial pancreatectomy, October 2012 at [facility].       4.  Recently diagnosed with enterococcus bacteremia, right            now is on vancomycin treatment for two weeks until October 09, 2011, recent C. diff colitis.       5.  History of DVT SP IVC filter.  FAMILY HISTORY:       Patient did not know.  Patient was adopted.              SOCIAL HISTORY:       Patient quit drinking for one month now, patient denied any       smoking or drug abuse.              REVIEW OF SYSTEMS:       Ten-point system review was negative except as in HPI.              LABORATORY STUDIES:       Lipase 755, liver function normal.  White blood cells 13.9,       hemoglobin 12.8, platelets of 503.  UA, white blood cell 12,       esterase 25.              PHYSICAL EXAMINATION:       VITAL SIGNS:  Temperature is 37, heart rate 85, respiration 18,              blood pressure 159/109, oxygen saturation 98% on room air.       GENERAL:  Patient is crouching in bed with pain.  Patient is       alert and oriented.       HEENT:  PERRLA, EOMI.  There was no jaundice       CARDIAC:  Regular, no murmur.       LUNGS:  Clear.  No wheezing.       GI:  There is some kind of a guarding.  There is tenderness,       diffuse more in the right upper quadrant.  Bowel sounds       positive.       MUSCULOSKELETAL:  There is no acute change.  There is no edema.       Dorsal pulse being positive.       NEURO:  Grossly intact.       SKIN:  Warm, dry and no rash.              ASSESSMENT/PLAN:       1.  Acute and chronic pancreatitis.  At this time no clear           etiology of her insult.  Will put patient n.p.o.  Give           patient intravenous D5 and saline plus KCl and we will give           patient Dilaudid and Norco for pain control and nausea           medication.  Advance diet once patient improves with pain and           pancreatic enzyme.       2.  Hypokalemia, would give patient KCl p.o. plus           intravenous, will check magnesium.       3.  Enterococcal bacteremia.  Patient has been treated           with vancomycin, would continue.  The treatment will  be ended           tomorrow October 09, 2011.       4.  Recent Clostridium difficile colitis, has been treated           with vancomycin p.o.  Patient does not have diarrhea at this  moment.       5.  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       6.  History of alcohol abuse, again patient quit drinking           about a month ago.       7.  Suspicious for urinary tract infection.  Patient has           white blood cells and esterase in the urine.  Patient has           some leukocytosis but no fever.  No dysuria.  The           leukocytosis may well be because of the acute pancreatitis.           Would wait for the urine culture.  If it is positive, patient           will be treated.       8.  Deep vein thrombosis prophylaxis with Lovenox.                                            **PRELIMINARY REPORT UNLESS SIGNED**                                SEE DOCUMENT IMAGING SYSTEM FOR FINAL REPORT                I hereby certify this patient for hospitalization based upon        medical necessity as noted above.                                                      ________________________________                                        Theda Sers, MD                                                                                                                      ID:   [id number]                                                          DocType:   [id number]       \:   VR                                                                 /:  [id number]              DD:  10/08/2011 22:10:22                                                DT:  10/09/2011 07:04:00                                                JOB: [id number]                                                            CC:  PHYSICIAN NO PRIMARY (1002)            DR NO REFERRAL (1000)                   >                                                                  Authenticated by Theda Sers, MD On 10/11/2011 10:56:29 PM

## 2012-09-01 NOTE — Discharge Summary (Signed)
[facility]                               DISCHARGE SUMMARY         NAME: [name], [name]             ADMITTED:   08/19/2010       MR#:  [id number]                            DISCHARGED: 08/26/2010       DOB:  March 24, 1961                           ACCT#:      [id number]       _________________________________________________________________       DISCHARGING DIAGNOSES:       1.  Acute on chronic pancreatitis.       2.  Hypokalemia.         HOSPITAL COURSE:       This is a 52 year old female with history of alcohol and also       substance abuse, diagnosed with pancreatitis about two years       ago, originally at the [facility].  She had recurrent       bouts, last here at the beginning of May for the pancreatitis.       Not eating particularly well when she went home, shehad       recurrent symptoms.  Last alcohol couple of months ago.       Continues to smoke.  She has had her pseudocyst drained in the       last admission, so admitted with acute on chronic pancreatitis              but on 05/11 when she was admitted, she did get CAT scan of the       abdomen, which showed with pancreatitis as well as the scattered       areas of inflammatory pseudocyst formation predominantly in the       right aspect of the pelvis status post pseudocyst drainage on       05/11.  GI ordered MRI of the abdomen, which       showed stable changes associated with presumed pancreatitis with       extensive pseudocyst formation, subcapsular liver fluid       collection, ascites involving the duodenum and marked       involvement of the duodenum, no bleeding,   and patient continue        pancrelipase. Surgery was also on board.         Anemia:  Patient's hemoglobin baseline is around 9, but       hemoglobin dropped to 6.4.  There was no active GI bleed and        MRCP was also done secondary to concern for bleed.  There was no bleeding       from the pseudocyst as well status post  transfusion of 2 units       of PRBC.  During the same time, the Lovenox was discontinued       secondary to the drop in hemoglobin and since that time, the       hemoglobin has been stable at around 8.2 to 8.4.  Lower extremity duplex was done today to rule out DVT.  DVT was       ruled out.         Hypokalemia:  Potassium was replaced and there is hypovolemic       hyponatremia.         Laboratory data:  Sodium of 132; potassium of 3.4, which is       replaced, creatinine of 0.5.  Iron is decreased.  Ferritin is       increased.  Hemoglobin and hematocrit of 8.4/24.9, white count       of 9, and platelet count of 655.  CT of the abdomen which was       done on the 05/11 with scattered areas of pseudocyst status post       drainage on 05/11 and the MRCP which was done on 05/14       consistent with changes of the pancreatitis.  Lower extremity       duplex which was done today is negative for the DVT.         Patient was also having community-acquired pneumonia.  Today is       day #6 of the antibiotics and the status post thoracentesis was       also done, which does not look exudative.  Patient completed the         antibiotic course as per the new guidelines.  Five days of the       antibiotics is enough for the patient depending upon the       response.  Continue all of her home medications and the new       medication is to take ferrous sulfate and the pain medication.       We will give couple of pills and patient has to follow up with       the [facility].         There are not very much options from the GI point of view for       her.  If at all any procedures to be done, also it would be at       the [facility] for any interventions.         DISPOSITION:       To home.         DISCHARGING MEDICATIONS:       1.  Aldactone 25 once a day.       2.  B-complex 1 mg 1 tablet once a day.       3.  Aspirin 81 mg once a day.       4.  Lasix 20 mg once a day.       5.  Lisinopril 5 mg once a day.       6.   Lopressor 25 b.i.d.       7.  Potassium 20 mEq once a day.       8.  Prilosec 1 tablet once a day.       9.  Thiamine 100 mg once a day.       10.  Dilaudid 1 mg every 8 hours p.r.n.       11.  Tramadol 0.5 every 6 hours.       12.  Ferrous sulfate 325 t.i.d.       13.  Pancrelipase 24,000 units oral with meals.         FOLLOWUP:  The patient has to follow up with the GI Clinic in two to three       weeks.  Follow up with the nurse practitioner at the GI Clinic.       Spoke with Dr. Jamelle Haring.  A       TIME SPENT:       Discharge time taken more than 30 minutes.             **PRELIMINARY REPORT UNLESS SIGNED**       SEE DOCUMENT IMAGING SYSTEM FOR FINAL REPORT         ________________________________                              Nestor Lewandowsky, MD           ID:   [id number]       DocType:   [id number]       \:   DY       /:   [id number]       DD:  08/26/2010 16:05:25       DT:  08/27/2010 22:42:04       JOB: [id number]       >  Authenticated and Linus Orn by Nestor Lewandowsky, MD On 08/28/10 4:51:22 PM

## 2012-09-01 NOTE — Consults (Signed)
[facility]                                 CONSULTATION         NAME: [name], [name]             ADMITTED:   07/31/2010       MR#:  [id number]                            ACCT#:      [id number]       DOB:  [birth year]       _________________________________________________________________         [facility] ACCESS         :       SURGICAL CONSULTATION         DATE OF CONSULTATION:       08/01/2010         HISTORY:       This is a 52 year old white female with a long history of known       chronic relapsing pancreatitis secondary to ongoing alcohol       abuse.  She has had a previous cholecystectomy for gallstones       and pancreatitis, though it is a suspect that her pancreatitis       at that time was alcoholic related and not the gallstone       related.  She has had five hospitalizations for treatment of       pancreatitis flare-ups since 2010, most recently three weeks ago       at which time she was complaining of her typical epigastric and       right upper quadrant pancreatitis type pain.  She does note that       her pain did not completely resolve by the time of discharge,       and I see that a abdominal ultrasound during that       hospitalization revealed a new finding of an 8 cm "cyst"       adjacent to the left lobe of the liver.  The patient now returns       with worsening abdominal pain, which is described as sharp,       constant, and involving the epigastrium and right upper quadrant       with radiation around the flank to the back and down to the       groin.  She does admit to low-grade fevers at home and she       states that this episode of pain is different than her typical       episodes.         PAST MEDICAL HISTORY:       Chronic alcohol abuse, chronic recurrent pancreatitis, asthma,       hypokalemia, status post cholecystectomy.         MEDICATIONS:       Prozac.         ALLERGIES:       None known.         FAMILY HISTORY:       Not  contributory.         SOCIAL:       She continues to smoke one pack of cigarettes per day.  She       continues to actively  drink alcohol at this time.         REVIEW OF SYSTEMS:       As above.  She denies chest pain, palpitations, shortness of       breath.  She denies urinary frequency, hesitancy, or dysuria.       She denies cough or production of sputum.  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           PHYSICAL EXAMINATION:       VITAL SIGNS:  Temperature is 38.       HEENT:  Normocephalic, atraumatic.  Extraocular muscles are       intact.  Sclerae anicteric.       NECK:  Supple.       LUNGS:  Clear to auscultation bilaterally.       HEART:  Regular rate and rhythm.       ABDOMEN:  Firm, mildly distended with diffuse tenderness       especially around the right flank.       EXTREMITIES:  Without clubbing, cyanosis, or edema.       NEUROLOGIC:  Grossly intact.       SKIN:  Warm and dry without rashes or lesions.         LABORATORY ANALYSIS:       White blood cell count 15 on admission, now 11.4, hematocrit 26,       platelets 660.  LFTs are normal.  Electrolytes are within normal       limits.  BUN and creatinine 5 and 0.69, potassium 2.9, lipase       1189.  CT scan of the abdomen and pelvis reveals a new finding       of diffuse abdominal ascites.  There are multiple loculated       fluid collections around the right lateral abdominal wall       involving the retroperitoneum, which are rim enhancing and       likely represent retroperitoneal abscesses.  There are no gas       bubbles noted within the retroperitoneum.         ASSESSMENT AND PLAN:       1.  Chronic recurrent alcoholic pancreatitis with severe           flare up involving significant edema around the head of the           pancreas and new finding of diffuse abdominal ascites.       2.  Multiple loculated retroperitoneal and abdominal wall           fluid collection possibly representing  retroperitoneal           abscesses.  I agree with IV antibiotics for now.  I would           initially recommend either ultrasound or CT-guided           aspiration/drainage of these fluid collections if possible.           My hope would be these represent sterile fluid collection           that would not require any further invasive treatment,           however, the patient would be best served if these areas are           able to be percutaneously drained.  Surgical drainage of the  retroperitoneum would be a fairly significant endeavor and           would be associated with high morbidity.  We will follow           along with you during this hospitalization.             Thank you very much for asking me to see [name].             **PRELIMINARY REPORT UNLESS SIGNED**       SEE DOCUMENT IMAGING SYSTEM FOR FINAL REPORT         ________________________________                              Delena Bali, MD           ID:   [id number]       DocType:   [id number]       \:   BY       /:   [id number]       DD:  08/01/2010 17:28:50       DT:  08/02/2010 10:19:24         JOB: [id number]       >  Authenticated by Delena Bali.   MD On 08/08/2010 07:14:51 AM

## 2012-09-01 NOTE — Consults (Signed)
[facility]                                 CONSULTATION              NAME: Autumn, [name]             ADMITTED:   09/05/2011       MR#:  [id number]                            ACCT#:      [id number]       DOB:  06/27/1960       _________________________________________________________________              [place]              DATE OF CONSULTATION:                     CONSULTING PHYSICIAN:       Arita Miss, MD              REASON FOR CONSULTATION:       Acute pancreatitis.              HISTORY OF PRESENT ILLNESS:       Ms. [name] is a 52 year old, Caucasian female with a history of       recurrent acute pancreatitis due to alcohol use, who was       readmitted to [facility] on September 21, 2011 for       recurrent acute pancreatitis.  Patient had undergone a partial       pancreatectomy at [facility] in October 2012 and had been feeling fine       until [facility] Day weekend when she had two glasses of alcohol.       She then presented the following day with severe abdominal pain,       nausea, and vomiting.  She was found to have recurrent acute       pancreatitis.  She was treated with n.p.o. status, IV fluid       resuscitation, pain control.  CT scan during that admission was       consistent with acute pancreatitis as well as presence of a       pseudocyst.  She improved during her hospital course, though was       not completely asymptomatic upon discharge.  She was discharged       from the hospital earlier this week, and states that her pain       never really went up, but rather she continued to notice       epigastric pain which made it very difficult for her to eat or       drink.  She also continues to notice weakness, but reports no       fevers or chills, no rigors, no significant diarrhea, no black       tarry stools or rectal bleeding.  Upon presentation to the       emergency room three days ago, the patient was found to have a       lipase of  2300 or as her lipase on discharge had been 163.              PAST MEDICAL HISTORY:       1.  Recurrent acute pancreatitis.       2.  Possible chronic pancreatitis.       3.  History of alcohol abuse.       4.  Pancreatic pseudocyst.       5.  History of DVT, status post IVC filter.              PAST SURGICAL HISTORY:       1.  Drainage of pancreatic pseudocyst.       2.  Partial pancreatectomy in October 2012.       3.  IVC filter placement for DVT.              HOME MEDICATIONS:       1.  Pancrelipase.       2.  Nexium.       3.  Multivitamin.       4.  Vitamin D3.              5.  MiraLAX.       6.  Norco as needed.       7.  Zofran as needed.              ALLERGIES:       PERCOCET.              SOCIAL HISTORY:       The patient reports a prior history of alcohol abuse.  Since her       surgery in October 2012, she reports having had no alcohol       except for two glasses of alcohol over Memorial Day weekend.  No       alcohol use since.  She reports no tobacco or recreational drug       use.              FAMILY HISTORY:       Noncontributory.  Patient was adopted.              PHYSICAL EXAMINATION:       VITAL SIGNS:  Blood pressure 126/68, heart rate 107, current       temperature 100.6 degrees Fahrenheit, max temperature over last       24 hours was 102.5 degrees Fahrenheit, respirations 20, oxygen       saturation 90% on 2 L.       GENERAL:  A Caucasian female appears stated age, no acute       distress.  Lethargic appearing.       HEENT:  Sclerae anicteric.       LUNGS:  Clear to auscultation bilaterally.       CARDIOVASCULAR:  Regular rate and rhythm, no murmurs       appreciated.       ABDOMEN:  Soft, nondistended, bowel sounds present, large       midline abdominal scar well healed, mild diffuse tenderness to       palpation, worse in the epigastrium with some voluntary       guarding.       EXTREMITIES:  No pitting edema.       SKIN:  No ecchymoses of the periumbilical region or on the       flanks.               LABORATORY DATA:       CBC shows a white blood cell count of 9.1, hemoglobin 10.9,       hematocrit 31.7, platelets  413.  White blood cell count on       admission was 14.6, hemoglobin on admission was 14.9, platelets       on admission were 865.  Her BUN is 4 and creatinine is 0.76.       LFTs showed a total protein of 6.9, albumin 2.8, alkaline       phosphatase 216, total bilirubin 0.6, AST 33, ALT 15.  Her       lactic acid is 1.8.  Fecal leukocytes negative.  Stool culture       shows normal flora.  C. diff testing was positive.  Lipase       currently is 1121.  Lipase on admission was 2314.              Radiology studies, CT scan of the abdomen and pelvis on September 24, 2011, shows new moderate upper abdominal ascites, and increased       in pancreatic ductal dilation compared to previous scans with       now moderate to markedly dilated main pancreatic duct, more       swelling and low density in the pancreatic head consistent with       pancreatitis, no clear evidence of pancreatic necrosis, new       small fluid collection at the junction of the head and neck of       the pancreas, a new small collection of fluid in the superior       pancreatic neck, new lobulated intrapancreatic fluid collection       in the tail of the pancreas with linear extension into the       medial posterior spleen where there is an intrasplenic       pseudocyst measuring 4.2 x 4.0 cm.  There are perigastric              varices noted.  Evidence of prior cholecystectomy.  There is       also mild free fluid in the pelvis consistent with ascites.              ASSESSMENT:       Mrs. [name] is a 52 year old female with a history of alcohol       abuse and subsequent recurrent episodes of acute pancreatitis.       Patient has had multiple complications of pancreatitis today to       include pseudocyst formation requiring drainage and now has a       new pseudocyst in the tail of the pancreas extending into her        spleen.  There is no evidence of necrosis, infected necrosis, or       hemorrhage.  I suspect that her fevers and leukocytosis (which       has since resolved) is due solely to her acute pancreatitis.       She will most likely need prolonged n.p.o. status until the       pancreatic inflammation resolves.  There is no evidence of       biliary or pancreatic duct obstruction, thus endoscopic       procedures are warranted at this time.  No obvious etiology for       the recent worsening of this episode of acute pancreatitis.       Suspect that she had never fully recovered from her recent       episode of pancreatitis.  Unclear if patient is still drinking       alcohol.              PLAN:       1.  Continued aggressive IV fluid resuscitation.       2.  Pain control as needed.       3.  Please keep patient n.p.o.       4.  We will have to discuss the possibility of placing a           feeding tube in the jejunum distal to the pancreas so as to           provide enteral nutrition without further stimulating the           pancreas.  Unclear if this will truly be of benefit to the           patient.       5.  Okay to continue antibiotics, though no signs of           infection present.       6.  Agree to treat positive C. diff test with p.o.           vancomycin as we are doing.                                            **PRELIMINARY REPORT UNLESS SIGNED**                                SEE DOCUMENT IMAGING SYSTEM FOR FINAL REPORT                                                        ________________________________                                        Arita Miss, MD                                                                                                                    ID:   [id number]                                                          DocType:   [id number]       \:   AP                                                                 /:  [id number]       DD:  09/24/2011 15:48:52                                                 DT:  09/24/2011 18:02:16                                                JOB: [id number]                                                            CC:  PHYSICIAN NO PRIMARY (1002)            DR NO REFERRAL (1000)                   >                                                                  Authenticated by Arita Miss, MD On 09/24/2011 06:17:24 PM

## 2012-09-01 NOTE — Consults (Signed)
[facility]                                 CONSULTATION         NAME: Autumn, [name]             ADMITTED:   09/05/2011       MR#:  [id number]                            ACCT#:      [id number]       DOB:  [birth year]       _________________________________________________________________       DATE OF CONSULTATION:       09/07/2011         CONSULTING PHYSICIAN:       Gwenith Spitz, MD         REASON FOR CONSULTATION:       Dr. Gerrit Heck has asked to see this patient for acute       pancreatitis with question of pancreatic mass on CT scan on       09/06/2011.         HISTORY OF PRESENT ILLNESS:       [name] is a 52 year old Caucasian female with a history of       alcohol abuse with recurrent pancreatitis and pseudocyst that       had to be drained.  She had a partial pancreatectomy at [facility] on       October 2012 and has been feeling well until now.  On 05/27, she       had two glasses of alcohol and ate very spicy spaghetti and felt       symptoms quickly thereafter.  She presented on 5/28 with nausea,       vomiting, abdominal pain.  There was no blood in the vomitus,       but refused to be admitted.  She returned home and then return       back here with worsening symptoms and was admitted.  She states       she has had no alcohol since October 2012 until 5/27.  She is       still vomiting up, 10 minutes ago and it is "green."  Bowel       movements are usually daily, there has been no GI bleeding.  She       is having headaches, which she attributes to the vomiting.  She       had a CT scan of pelvis and abdomen on 09/06/2011, which was       consistent with acute pancreatitis and showed abnormal low       density in the pancreatic head and body, pancreatitis versus       malignancy.  Lipase began at 1094, were down at 434 today.       Liver panel has been remarkable on 05/28, alk phos 177, total       protein 8.6, otherwise normal.  On 09/07/11, remarkable only  for       albumin 2.8.         PAST MEDICAL HISTORY:       1.  Alcohol abuse.       2.  DVT with an IVC filter.       3.  Recurrent pancreatitis.  4.  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         SURGERIES:       1.  Drainage of pancreatic pseudocyst.       2.  Partial pancreatectomy at [facility] on October 2012.       3.  IVC filter placed for DVTs.         ALLERGIES:       PERCOCET.         MEDICATIONS AT HOME:       1.  Daily multivitamin.       2.  MiraLAX.       3.  Nexium once a day.       4.  Pancrelipase before meals.         5.  Vitamin D3.       6.  Gabapentin 600 three times per day.       7.  Potassium 20 mEq once a day.       8.  Toradol 5 mg at bedtime p.r.n.         MEDICATIONS IN HOSPITAL:       1.  Zofran 4 mg q.4 h. p.r.n. IV.       2.  Pantoprazole 40 mg IV daily.       3.  Apresoline 10 mg q.6 h. p.r.n. IV.       4.  Multivitamins.       5.  Vitamin D3.       6.  Pancrelipase ______ p.r.n.       7.  Acetaminophen 650 oral q.6 h. p.r.n.       8.  Gabapentin 600 mg t.i.d. p.r.n.       9.  Imipenem 500 mg IV q.6 h. p.r.n.       10.  Dilaudid 1 mg IV q.3 h. p.r.n.         FAMILY HISTORY:       Unknown as she is adopted.         SOCIAL HISTORY:       Not a smoker.  Not using illicit drugs.         REVIEW OF SYSTEMS:       CONSTITUTIONAL:  She is feeling febrile.  She is having       abdominal pain, nausea, vomiting.  Seeing well, hearing well.       No shortness of breath.  No chest pain, per se.  GI:  As stated       above in the HPI.  ______ MUSCULOSKELETAL, INTEGUMENTARY,       NEUROPSYCH:  Noncontributory.         PHYSICAL EXAMINATION:       VITAL SIGNS:  99.1 is her temperature with pulse 74,       respirations 17, blood pressure 165/87.       HEENT:  Pupils equal, round, reactive to light and       accommodation.  Sclerae anicteric.  Normocephalic.  Trachea       midline.       LUNGS:  Clear to auscultation.       HEART:  Regular rate and rhythm.       ABDOMEN:  Skin of the abdomen is warm,  dry, intact.  Normoactive       bowel sounds.  She has generalized abdominal tenderness, most       noticeable in the epigastrium.  No peripheral edema.  LABORATORY TEST AND DATA:       On 09/07/2011, lipase 434.  H and H 12.9 and 38.5, white cells       6.5, platelets 208.  Liver panel remarkable only for albumin at       2.8.  Sodium 136, potassium 3.5, chloride 106, CO2 of 19.1, BUN       8, creatinine 0.74, and glucose 135.         On 09/06/2010, H and H 14.2 and 42.0.  White cells 14.6,       platelets 216.  CT of pelvis and abdomen was consistent with       acute pancreatitis, abnormal low density in the pancreatic head       and body, thought to be acute pancreatitis versus malignancy.       Sodium 134, potassium 3.6, chloride 102, CO2 of 20.1, BUN 11,       creatinine 0.84, and glucose 110.         On 09/05/2011, lipase was 1094 and 751 later.  Liver panel       remarkable for alk phos 177, total protein 8.6.  H and H 15.5,       44.5, white cells 13.7, platelets 306.           ASSESSMENT AND PLAN:       To be completed by Dr. Carmelina Noun.         THE ABOVE DICTATED BY Dawna Part, CFNP         ADDENDUM DICTATED BY Dorothea Ogle, MD, 09/07/2011 17:55:           CONSULTING PHYSICIAN:       Gwenith Spitz, MD         I saw and evaluated [name] at [facility] on Sep 07, 2011.       Below is my assessment and plan.         ASSESSMENT:       1.  Acute relapsing pancreatitis that is thought to be           predominantly if not solely due to alcohol.  Although she has           had a remote history of cholelithiasis, she has never had           known choledocholithiasis.  She has had multiple recurrent           attacks.  Apparently, after recurrent attack, she was           eventually referred to the [facility] [place], and           underwent some extensive surgical procedure and effort to           help lessen her pancreatitis.  I have no details of this, but           she had been actually well for a  period of about six months.           However, the other day, she took some alcohol and had some           spicy foods and had typical attack of pancreatitis.  Her           pancreatitis appears to be mild to moderate.  There are no           significant Ranson criteria.  She still has ongoing           complaints.  2.  Abnormal CT scan.  The patient has acute recurrent           pancreatitis.  Despite comments by the radiologist, the           clinical course is just not consistent with a neoplastic           lesion.  I doubted that one would be present.  She has had           multiple imaging over time and although anything is possible,           the likelihood is quite small.  I think this can be followed           clinically without further investigation or intervention.       3.  Average risk for colorectal cancer.  I do not have any           documentation that she has had a colonoscopy.       4.  Inadequate pain control according to her history.         RECOMMENDATIONS:       1.  I would continue with aggressive intravenous fluids in the hopes to           alleviate the pancreatitis.  There is no reason that she           could not tolerate a higher fluid rate, that certainly would           help less in the inflammatory fracture of the pancreatitis.           She is still tender, anorectic and therefore, has ongoing           pancreatic injury and fluids of the key to this.  To that           extent, I am planning on giving her 250 cc an hour until she           is re-evaluated by me tomorrow.       2.  According to her history, she has had an adequate pain           control.  This is not surprising given her prior alcohol use.           I think she likely will need higher doses of medicines.  I           will modify her doses of morphine to give her Dilaudid and           gave her 1 to 3 mg every 2 hours as needed for pain.  If this           provides an adequate pain control, then a PCA pump may be            warranted.       3.  Continue to keep her n.p.o.       4.  Follow up CT scan or imaging in about 3 to 6 months.           I do not see any reason to do anything further.  I think that             the imaging from an ultrasound would be inadequate.  An MRI           may be a reasonable step, but unfortunately may not be able           to be compare  it adequately to the CT findings.  Therefore, a           CT may be warranted.       5.  I will plan a routine colonoscopy as an outpatient.       6.  Regarding her fever, I think that this is not related           to any GI tract pathology and probably due to the           pancreatitis itself.  If she is showing no signs of fever for           48 hours, I would simply stop the antibiotics.         REV SG, 09/08/2011, 1130 HOURS.                                  **PRELIMINARY REPORT UNLESS SIGNED**                          SEE DOCUMENT IMAGING SYSTEM FOR FINAL REPORT                                   The above dictated by Dawna Part, CFNP                                        ________________________________                                        Gwenith Spitz, MD               ID:   [id number]       DocType:   [id number]       \:   NP       /:   412       DD:  09/07/2011 16:24:30       DT:  09/07/2011 19:23:10       JOB: [id number]       CC:  Durward Mallard, MD ([id number])            PHYSICIAN NO PRIMARY (1002)            DR NO REFERRAL (1000)         >  Authenticated and Edited by Gwenith Spitz., MD On 09/13/11 5:06:29 PM

## 2012-09-01 NOTE — H&P (Signed)
[facility]                             HISTORY AND PHYSICAL              NAME: [name], [name]             ADMITTED:   07/31/2010       MR#:  [id number]                            ACCT#:      [id number]       DOB:  1960/05/31       _________________________________________________________________              [place]              PRIMARY CARE PHYSICIAN:       None.              CHIEF COMPLAINT:       Abdominal pain.              HISTORY OF PRESENT ILLNESS:       This is a 52 year old female with a history of chronic       pancreatitis who presented to the ER because of epigastric/right       upper quadrant abdominal pain, worsened in the past week or so.       She was recently hospitalized at [facility] about       3 weeks ago for profound hypokalemia, diarrhea and possible       acute-on-chronic pancreatitis.  Workup back then included an       abdominal ultrasound which showed hepatic steatosis as well as       interval development of a large cyst adjacent to the left       hepatic lobe, felt to be a seroma.  She did have a colonoscopy       about 1-1/2 years ago.  During this ultrasound exam, her common       bile duct was slightly enlarged which was felt to be secondary       to the cholecystectomy.  She was replaced aggressively for       potassium for hypokalemia.  Her lipase was also initially       elevated.  By the time she was discharged, however, her lipase       has decreased to just a little over 100.  Since she went home,       she continued to have right-sided abdominal pain, initially in       the right lower abdomen and later on went to right upper       abdomen.  In the past 24 hours, the pain has gotten so severe       subsequently she presented to the ER.  She also reports       subjective fever occasionally, although she never took her       temperature.  No history of dysuria or diarrhea.  She had a       small quantity of alcohol 3  or 4 days ago.  In fact, it did not       taste good so she said she poured most of the drink down the       drain.  She has also noticed a decreased  appetite.  Food simply       did not taste good.  In addition, she also noticed swelling both       in the abdomen and lower extremities.  In the ER, her initial       workup showed an elevated lipase level which was over 1000,       consistent with pancreatitis, which is why she will be       readmitted to the hospital for further treatment.  Incidentally,       her potassium level is again low, being 3.              PAST MEDICAL HISTORY:       1.  Recurrent/chronic pancreatitis.       2.  Alcohol abuse.       3.  Asthma.       4.  Recurrent hypokalemia.       5.  Status post cholecystitis 1-1/2 years ago.       6.  Probable seroma next to left hepatic lobe per abdominal           ultrasound.              CURRENT OUTPATIENT MEDICATIONS:       Prozac 20 mg p.o. daily, as well as over-the-counter medications              including a potassium supplement.              FAMILY HISTORY:       She was adopted.  She does not know any biologicals family       members.              SOCIAL HISTORY:       She has been a smoker, 1 pack per day for over 30 years.  She       used to drink heavily since her son died from methadone overdose       9 years ago, although lately she is drinking less.  Denies       history of substance abuse.              ALLERGIES:       No known drug allergies.              REVIEW OF SYSTEMS:       Per HPI.  All other pertinent systems were reviewed and negative       physical.              PHYSICAL EXAMINATION:       VITAL SIGNS:  Her blood pressure is 128/79, heart rate 81,       respiration 18, afebrile.  O2 sat is 95% on 2 liters.       GENERAL:  The patient is not in acute distress.       EYES:  Pink conjunctivae.  Anicteric.       CARDIOVASCULAR:  Regular rate and rhythm without significant       murmur.  No JVD or carotid bruits.  There is 2+  bilateral lower       extremity edema.       RESPIRATORY:  Lungs are clear to auscultation bilaterally.       GI:  Abdomen is soft with right mid upper quadrant tenderness on       palpation with some degree of guarding.  No rebound.  Hypoactive  bowel sounds.       MUSCULOSKELETAL:  There is no significant joint effusion.  Range       of motion of all major joints appears intact.       NEUROLOGICAL:  She is nonfocal.       PSYCHIATRIC:  Alert and oriented x3.  Normal affect.       LYMPHATICS:  There is no significant lymphadenopathy in the       cervical and axillary areas.              DATA:       Lipase today is 1189, three weeks ago was less than 200.       Glucose 103, sodium 138, potassium 3.0, BUN 6, creatinine 0.78,       albumin 1.5.  ALT 18, AST 22, direct bili ______, total bili       0.8, alk phos 231.  WBC 15.4, hemoglobin 11.2, platelets 859.       UA shows 4 WBC per high-powered field.  EKG not done.  Two-view       chest x-ray shows bilateral basilar atelectasis.  No definitive       pneumonia is seen.              ASSESSMENT AND PLAN:       1.  A 52 year old female with history of recurrent chronic           pancreatitis status post cholecystectomy 1-1/2 years ago,           history of alcohol abuse, asthma, now presents with worsening           abdominal pain associated with elevated lipase, pancreatitis           most likely the diagnosis.  She does have a significant           leukocytosis.  Given the possible seroma detected by the           ultrasound 3 weeks ago, would obtain abdominal CT scan to           rule out other potential causes for her abdominal pain and           leukocytosis.  Hold antibiotic coverage unless CT scan shows           findings that would warrant antibiotic treatment.       2.  The patient has recurrent hypokalemia.  Etiology for                  this is unclear.  During her previous hospitalization, her           magnesium level was 1.7.  Would repeat magnesium  level and           replace as indicated.  Would also check a urine potassium           level to see if she is losing potassium through the urine.           Last time she had diarrhea, and this time currently in fact           she is constipated.  Replace potassium with IV fluid.  Keep           her on clear liquid diet for now because of pancreatitis.           Gastrointestinal and deep vein thrombosis prophylaxis will  also be provided.       3.  Hypoalbuminemia.  The etiology for her hypoalbuminemia           is unclear at this point.  Decreased liver production is most           likely the cause given her long history of alcohol abuse,           although sepsis and poor nutritional status are also           possibilities.  Will continue to monitor.                            **PRELIMINARY REPORT UNLESS SIGNED**       SEE DOCUMENT IMAGING SYSTEM FOR FINAL REPORT                                 ________________________________                              Durward Mallard, MD                                            ID:   [id number]       DocType:   [id number]       \:   SF       /:   [id number]       DD:  07/31/2010 12:45:06       DT:  07/31/2010 13:55:11       JOB: [id number]       >  Authenticated by Marlou Sa., MD On 08/02/2010 02:10:12 PM

## 2012-09-01 NOTE — H&P (Signed)
[facility]                             HISTORY AND PHYSICAL              NAME: [name], [name]             ADMITTED:   09/21/2011       MR#:  [id number]                            ACCT#:      [id number]       DOB:  Sep 30, 1960       _________________________________________________________________       PRIMARY CARE PHYSICIAN:       Dr. Loreta Ave at [facility].              CHIEF COMPLAINT:       Abdominal pain.              HISTORY OF PRESENTING ILLNESS:       This is a pleasant 52 year old female with a history of alcohol       abuse.  History of DVT and IVC filter, recurrent pancreatitis       and history of pancreatic pseudocyst came to the ER with       complaints of abdominal pain.  The patient was discharged on       Monday from our hospital after being treated with another       episode of acute on chronic recurrent pancreatitis.  The patient       went home, but says her pain never let up and she continued to       have it in the mid abdominal area making it very difficult to       eat or drink.  Continued to feel very weak.  She did not have       any high fever, but did complain of some rigors.  As her pain       continued to get worse, she decided to come to the ER for       further evaluation and workup.  She denied having any melena or       hematochezia.  She says that she is unable to eat and has been       having vomiting.  The pain is so severe that she is cramping up.       She does not have really an appetite and is unable to eat due to       fear of the pain.  No urinary complaints.  No constipation.       Does have some loose bowel movement, which appears to be a       chronic problem for her.  She used to have a history of alcohol       abuse, but has not had any further drink since [facility],       which had brought on the previous episodes of acute       pancreatitis.  But, since her discharge from our hospital she       did not have any further episodes.   In the ER, she was found to       have a lipase of 2300, her last lipase from discharge was 163.       Currently, I am asked  by the ER physician to admit this patient       for acute on chronic recurrent pancreatitis.              PAST MEDICAL HISTORY:       History of recurrent pancreatitis, recent pancreatitis,       pancreatic pseudocyst, history of alcohol abuse, history of DVT       and IVC filter.              SURGERIES:       Include drainage of pancreatic pseudocyst, partial       pancreatectomy at [facility] in October 2012, and an IVC filter for       DVT.              ALLERGIES:       Include PERCOCET.              MEDICATIONS AT HOME:       Include:  Pancrelipase, vitamin D3, Nexium, multivitamin,       MiraLAX, Norco, and Zofran p.r.n.              FAMILY HISTORY:       She was adopted.                     SOCIAL HISTORY:       Does not smoke or do any IV drug abuse.  Has history of alcohol       abuse.  Her last drink was [facility], has not drank since.              REVIEW OF SYSTEMS:       Ten-point system was asked and negative, except for those       already mentioned in history of presenting illness.              PHYSICAL EXAMINATION:       VITALS:  Blood pressure is 150/90, heart rate is 84, respiratory       rate is 18, temperature is 97.7.       HEENT:  Head is atraumatic, normocephalic.  Conjunctivae is       clear.  Oral mucosa moist.  External ear no discharge.  Nose no       discharge.       NECK:  Supple.  No carotid bruit.  No JVD.       LUNGS:  Bilaterally clear to auscultation.       ABDOMEN:  Soft, diffuse tenderness with voluntary guarding.  No       rigidity.  Bowel sounds are positive.  There is a large       abdominal scar.       CARDIOVASCULAR:  S1, S2.  No murmur.       EXTREMITIES:  Did not have any edema.  Joints, no swelling.       SKIN:  No purpura noted.  Anicteric.       PSYCHIATRIC:  Does not appear to be depressed.       NEUROLOGICALLY:  No focal neurological deficit.               BUN creatinine 13 and 0.8, potassium 4.8, H and H is 14 and 43,       platelets 800.  WBC is 14.  Lipase is 2300, amylase 899, total       bili 0.6.  Chest x-ray and abdominal flat and upright, pleural  effusion which is minimal.  MRI that was done on the previous       admission on 09/15/2011 showed intraparenchymal pseudocyst, but       no masses were identified. .              ASSESSMENT AND PLAN:       A 52 year old female, came in again to the emergency room with       recurrent abdominal pain.              1.  Acute on chronic pancreatitis, recurrent, with           underlying pseudocyst.  She did have an elevated white blood           cell count of 14 and does not have a fever, but did complain           of some rigors at home.  At this point, I am going to start           her on aggressive intravenous fluid.  We will keep her on           clear liquid, pain management, as well and imipenem for           possibility of infection of the pseudocyst.  She will also           get pan-cultured and if this is negative, antibiotics should           be discontinued at that point.  She has a regular appointment           with her primary gastroenterologist at [facility], Dr. Loreta Ave on           Monday.       2.  Nausea and vomiting.  Would be treated with supportive           measures.  We will repeat abdominal imaging serially.       3.  For her hypertension, we will place her on labetalol.       4.  For deep venous thrombosis prophylaxis, put her on           heparin subcu t.i.d.       5.  The patient is being admitted by [facility].                                            **PRELIMINARY REPORT UNLESS SIGNED**                                SEE DOCUMENT IMAGING SYSTEM FOR FINAL REPORT                       I hereby certify this patient for hospitalization based upon        medical necessity as noted above.                                                      ________________________________  Rodman Pickle, MD                                                                                                                      ID:   [id number]                                                          DocType:   [id number]       \:   SW                                                                 /:   [id number]       DD:  09/21/2011 16:42:44                                                DT:  09/21/2011 17:52:07                                                JOB: [id number]                                                            CC:  PHYSICIAN NO PRIMARY (1002)            DR NO REFERRAL (1000)                   >                                                                  Authenticated by Rodman Pickle, MD On 09/27/2011 05:13:41 PM

## 2012-09-01 NOTE — H&P (Signed)
[facility]                             HISTORY AND PHYSICAL              NAME: [name], [name]             ADMITTED:   09/05/2011       MR#:  [id number]                            ACCT#:      [id number]       DOB:  [birth year]       _________________________________________________________________       CHIEF COMPLAINT:       Abdominal pain, nausea, vomiting.              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  Previously she underwent       partial pancreatectomy over at [facility], comes in with one-day       history of increasing abdominal pain and nausea or vomiting.       She apparently drank two glasses of alcohol last night plus       spicy food and that she was here earlier this morning and       refused to be admitted and went home and again returned with       worsening nausea, vomiting and abdominal pain.  She denies any       fevers or chills.  Does not report any diarrhea or blood in       stool.  No fever reported.  She had a lipase greater than a       1000, when she first came in the morning and it is 750 at this       time.  She will be admitted for acute bout of pancreatitis and       nausea and vomiting.              REVIEW OF SYSTEMS:       A 10 point review of systems was performed.  All are negative       except for the ones that are mentioned in history of present       illness.              PAST MEDICAL HISTORY:       Significant for recurrent pancreatitis, history of alcohol       abuse, pancreatic pseudocyst, status post partial       pancreatectomy, history of DVT status post IVC filter placement.              ALLERGIES:       To PERCOCET.              SOCIAL HISTORY:       The patient denies smoking.  She states she  has quit drinking,       but she had two glasses of alcohol last night.  Denies drug use.  FAMILY HISTORY:       Unknown since she was adopted.              MEDICATIONS ARE:       1.  Multivitamin one tab daily.       2.  Gabapentin 600 mg t.i.d.       3.  Nexium 40 mg one tab daily.       4.  Pancrelipase 5000 three times daily.       5.  Potassium 40 mEq daily.       6.  Vitamin D3 two tabs once a day.       7.  MiraLAX 1 powder as needed.       8.  Toradol 5 mg at bedtime as needed.              PHYSICAL EXAMINATION:       GENERAL:  The patient is a middle-aged female, in mild distress              due to abdominal pain and nausea.       VITAL SIGNS:  Temperature is 36.7, heart rate 95, respiratory       rate 20, blood pressure 181/113.       HEENT:  Normocephalic, atraumatic.  Pupils are equally round,       reactive to light and accommodation.  Extraocular motions       intact.  Mucosa is pink, but dry.       NECK:  Supple.  No JVD.  No bruits.  No lymphadenopathy.       LUNGS:  Sounds are clear to auscultation.  No rales, no       wheezing, no rhonchi.       CARDIOVASCULAR:  Patient is slightly tachycardic, S1, S2.  Could       not appreciate murmurs, gallops, or rubs.  No S3, S4..       ABDOMEN:  Soft.  There is epigastric tenderness.  There is       voluntary guarding.  There is no rebound.  Bowel sounds are       present.       EXTREMITIES.  Pedal pulses and radial pulses are present.  No       rashes or ulcerations noted.       NEUROLOGIC:  The patient is alert and oriented x3.  Cranial       nerves 2-12 grossly intact.  Power 5/5 in all four extremities.              LABORATORY DATA:       Chem 8 is within normal limits.  Lipase this morning was greater       than 1000, currently 751.  LFTs show an alk phos of 191, other       indices are within normal limits.  CBC shows a white count of       13.7, hemoglobin 13, hematocrit of 44 with a platelet count of       306.  UA is negative.               ASSESSMENT AND PLAN:       1.  Acute on chronic pancreatitis.  Will go ahead and           admit the patient.  Will keep on clears, if she cannot           tolerate p.o. will keep her  n.p.o.  Will go ahead and use           antiemetics, pain management and IV fluids.  I will go ahead           and do a CT of the abdomen and pelvis to make sure there is           no recurrence of pseudocyst.       2.  Nausea, vomiting.  Again, this is due to acute bout of           pancreatitis.  Will treat the underlying cause.  Will use           Zofran as needed.       3.  Dehydration will continue patient on IV fluids.       4.  Abdominal pain.  This is secondary to pancreatitis,           will go ahead and use Dilaudid as needed for pain.       5.  Hypertension which is uncontrolled.  This could have           gotten exacerbated by pain.  I will go ahead and use IV           hydralazine for control right now.  If she continues to have           elevated blood pressure she will need to be started on           scheduled meds.              TIME SPENT:       Total time spent on admitting the patient was greater than 50       minutes.                                            **PRELIMINARY REPORT UNLESS SIGNED**                                SEE DOCUMENT IMAGING SYSTEM FOR FINAL REPORT                I hereby certify this patient for hospitalization based upon        medical necessity as noted above.                                                      ________________________________                                        Jamas Lav, MD  ID:   [id number]                                                          DocType:   [id number]       \:   NK                                                                 /:   [id number]       DD:  09/05/2011 15:49:53                                                DT:  09/05/2011  16:25:35                                                JOB: [id number]                                                            CC:  PHYSICIAN NO PRIMARY (1002)            DR NO REFERRAL (1000)                   >                                                                  Authenticated by Jamas Lav, MD On 09/05/2011 06:27:48 PM

## 2012-09-01 NOTE — Discharge Summary (Signed)
[facility]                               DISCHARGE SUMMARY              NAME: [name], [name]             ADMITTED:   09/21/2011       MR#:  [id number]                            DISCHARGED: 09/30/2011       DOB:  02/23/1961                           ACCT#:      [id number]       _________________________________________________________________       DISCHARGE DIAGNOSES:       1.  Acute pancreatitis.       2.  Multiple attacks of recurrent pancreatitis.       3.  Enterococcus bacteremia.       4.  Clostridium difficile colitis.       5.  History of alcohol abuse.       6.  History of pancreatic pseudocyst.       7.  History of deep venous thrombosis, status post           inferior vena cava filter.       8.  Hypertension.       9.  History of drainage of pancreatic pseudocyst.       10.  History of partial pancreatectomy at [facility].              DISCHARGE MEDICATIONS:       1.  Daily multivitamin.       2.  Nexium.       3.  Pancrelipase.       4.  Vitamin D.       5.  MiraLax p.r.n.       6.  Zofran.       7.  Vancomycin 1000 mg oral IV q.12 h., to be given           through 10/09/2011.       8.  Norco 10/325, one tab q.6 h. p.r.n. for pain.       9.  Vancomycin p.o. 125 mg oral b.i.d. for one more week.       10.  Mag oxide 800 mg oral b.i.d. for 5 days.       11.  Norvasc 5 mg oral ______.              DISCHARGE INSTRUCTIONS:       1.  I have discussed with the Home Health nurse.  The           patient has Home Health set up for today with antibiotics           scheduled at 6 p.m.  The patient will be continued on           vancomycin b.i.d. through 10/09/2011.  Levels will be drawn           on this Monday that is 10/02/2011 of vancomycin trough, CBC           with diff and CMP.  The results will be faxed to Dr.  Ovidio Kin.       2.  The patient instructed to make an appointment with his           GI physician at [facility].              PHYSICAL EXAMINATION:        GENERAL:  In no apparent distress.       HEENT:  Head is atraumatic, normocephalic.   Pupils equal,       round, reactive to light and accommodation.       NECK:  Supple.  Trachea is central.  No thyromegaly.       CHEST:  Clinically clear to auscultation.       CARDIOVASCULAR:  S1, S2.  Regular rate and rhythm.  No murmurs       or gallops.       ABDOMEN:  Soft.  No tenderness.  No organomegaly.  Bowel sounds       positive.       PSYCH:  Alert, oriented x3.  Mood is appropriate.  Insight is       intact.              LABORATORIES AND IMAGING PRIOR TO DISCHARGE:              Mag is 1.5, lipase prior to discharge is 39, lipase on admission       was over 2000. Chem-8 is within normal limits.  CBC is within       normal limits.  Blood cultures are positive for Enterococcus       faecium sensitive to vancomycin.  Stool is positive for C. diff       on 09/23/2011.  CT of the abdomen showed worsening pancreatitis.       There is new abdominal pelvis ascites.  There is new small       intrapancreatic fluid collection.  There is worsening dilation       of the pancreatic duct.  There is low density in the pancreatic       head and neck is greater than the last exam likely due to acute       inflammation.  This is not low enough density to suggest       pancreatic necrosis.  An intrapancreatic cyst in the tail of       pancreas has dissected into the spleen with an evidence of       intrapancreatic pseudocyst.  There is no discrete abscess.       Please see the full report.              CONSULTANTS:       1.  Rosiland Oz, MD, for Infectious Disease.       2.  Arita Miss, MD, for GI.              BRIEF HOSPITAL COURSE:       This is a 52 year old female with history of recurrent       pancreatitis, followed at [facility] by Dr. Loraine Leriche who was recently       discharged from our facility after being treated appropriately       for acute pancreatitis with bowel rest, IV hydration, and       narcotics.  When the patient went home,  the patient's pain       started to come back again and the patient presented to the ER.  In the ER, the patient was found to have a lipase of over 2300,       was admitted for recurrent pancreatitis.  The patient was       admitted to medical floor, started on IV imipenem.  GI       evaluation was requested.  The patient did have a CT of the       abdomen, which showed some worsening of the pancreatitis as       possible evidence of duct dilation, but GI reviewed CT scan.       Deferred that there was no need for an endoscopy procedure at       that time, and recommended a prolonged bowel rest and IV       hydration.  The patient in light of recurrent hospitalization       and worsening pancreatitis on the CAT scan, did get a prolonged       bowel rest.  In fact, her total hospital course been 10 days,       this was of course complicated by enterococcus bacteremia for       which ID evaluation was requested.  This bacteremia was thought       likely secondary to her pancreatitis.  The patient initially was       on Primaxin subsequently Zosyn and vancomycin.  Zosyn was       discontinued.  The patient continued on vancomycin, which she       will be given until July 1st as stated in my discharge       instructions.  Home Health and follow up labs has already been       set up.  Hospital stay also complicated by diarrhea.  Stools       were positive for C. diff for which patient was started on p.o.       vancomycin.  Her diarrhea subsequently resolved.  The patient's       diet has been advanced for over 24 hours.  Now, she is       tolerating regular diet well with no evidence of abdominal pain.       The patient has been off IV narcotics for over 24 hours as well.       She is tolerating p.o. narcotics.  There has been no further       episodes of diarrhea, nausea, or vomiting.  Otherwise, no major       complications.  Today, the patient will be discharged home with       the above instructions.               TIME SPENT:       Time spent on discharge including examining the patient,       dictating this discharge summary, arranging for the follow up       visit is 42 minutes.                                                   **PRELIMINARY REPORT UNLESS SIGNED**                                SEE DOCUMENT IMAGING SYSTEM FOR FINAL REPORT  ________________________________                                        Stann Ore, MD                                                                                                                  ID:   [id number]                                                          DocType:   [id number]       \:   CR                                                                 /:   [id number]       DD:  09/30/2011 10:10:37                                                DT:  09/30/2011 23:54:40                                                JOB: [id number]                                                            CC:  PHYSICIAN NO PRIMARY (1002)            DR NO REFERRAL (1000)                   >                                                                  Authenticated by Stann Ore, MD On 10/06/2011 08:54:53 PM

## 2012-09-01 NOTE — H&P (Signed)
[facility]                             HISTORY AND PHYSICAL         NAME: [name], [name]             ADMITTED:   08/19/2010       MR#:  [id number]                            ACCT#:      [id number]       DOB:  December 31, 1960       _________________________________________________________________       PRIMARY CARE PHYSICIAN:       None.         ADMITTING PHYSICIAN:       [facility].         CHIEF COMPLAINT:       Abdominal pain.         HISTORY OF PRESENT ILLNESS:       The patient is a 52 year old, white female with a history of       chronic pancreatitis who was recently admitted to [facility] on 07/29/2010 with subsequent discharge on       08/12/2010.  The patient claims that she had been very vigilant       about her resumption of diet and has been sober without drinking       any alcohol.  She returns to the hospital tonight with       complaints of worsening abdominal pain in the epigastric and in       the periumbilical region.  She has felt cold and hot with no       fever recorded.  She denies any nausea and vomiting.  Denies any       diarrhea and constipation.  Denies any melena, hematochezia.       Denies any coughing, chest congestion or wheezing.  During her       last admission, the CAT scan of the abdomen and pelvis showed       pancreatitis with multiple enlarging pseudocysts in the right       retroperitoneum.  She also had some increasing dilatation of the       common bile duct and pancreatic duct suggesting possible       obstruction.  Patient tonight is complaining of significant pain       in her right upper and lower quadrants as well.  She denies any       back pain.  She is being readmitted to the hospital for her       relapse of the acute on chronic pancreatitis.  She incidentally       was also noted to have hypokalemia secondary to her increased       nausea and vomiting.         PAST MEDICAL HISTORY:        Recurrent/chronic pancreatitis, alcohol abuse in the past but       currently sober, history of asthma, recurrent hypokalemia,       history of status post cholecystitis 1-1/2 years ago, possible       stoma, left hepatic lobe for abdominal ultrasound, history of       pseudocyst on the pancreas.  MEDICATIONS:           Home medicines:         1.  Prilosec 20 mg p.o. once daily.       2.  Tramadol 0.5 p.o. every 6 hours as needed.       3.  Lasix 20 mg p.o. once daily.       4.  Potassium chloride 20 mg p.o. once daily.       5.  Ecotrin 81 p.o. once daily.       6.  Aldactone 25 p.o. once daily.       7.  Thiamine 1 mg p.o. once daily.       8.  Folic acid 1 mg p.o. once daily.         9.  Lopressor 25 p.o. twice daily.       10.  Dilaudid 2 mg p.o. every 8 hours as needed.       11.  Lisinopril 5 mg p.o. once daily.         ALLERGIES:       No known drug allergies.         FAMILY HISTORY:       Noncontributory as the patient is adopted.         SOCIAL HISTORY:       The patient is abstinent of alcohol.  She does not use drugs.       Does not smoke.  She lives at home.         PHYSICAL EXAMINATION:       GENERAL:  The patient is a 52 year old female who is alert and       responsive.  She is in significant pain but she does not appear       to be in acute distress.       VITAL SIGNS:  Blood pressure 156/111, heart rate 97.9,       respiration rate of 20, pulse ox of 99% on room air, temperature       97.9.       HEENT:  Pupils are equal and reactive.  Extraocular movements       are intact.  Conjunctivae are slightly pale.  Sclerae is       anicteric.  Oral mucosa moist.  Tongue is non-coated.       NECK:  Supple without any JVD or carotid bruit.       CHEST:  Clear to auscultation without any rales, rhonchi, or       wheeze noted.       CVS:  S1, S2.  Regular rate and rhythm.  No murmur or gallop is       noted.       ABDOMEN:  Soft but significantly tender in the epigastric and       right upper quadrants.   Bowel sounds are diminished.       CNS:  Grossly nonfocal.  No motor or sensory deficit noted.  No       tremors are noted.       EXTREMITIES:  No evidence of any rash or petechia noted.  No       spider nevi noted.       PSYCHIATRIC:  Patient has appropriate demeanor.  Maintains eye       contact.         LABORATORY DATA:       Glucose of 107, sodium 135, potassium of  2.5, chloride of 93,       bicarb 27, BUN of 7, creatinine of 1.4.  Anion gap 19.0, GFR of       40.         Liver function tests: ALT 10, AST 19, total bilirubin 1.1,       albumin of 2.3.         Lipase of 687.         CBC with differential:  WBC count 12.5, hemoglobin of 9.2,       hematocrit 28.0, platelet count of 824, neutrophils 75% with a       normal differential.  No leftward shift or bandemia is noted.         IMAGING:       CAT scan of the abdomen and pelvis with p.o. contrast is at the       moment pending.         IMPRESSION:       1.  Abdominal pain.  Recurrent episode of pancreatitis           with acute on chronic pancreatitis in light of the pseudocyst             noted on the CAT scan of the abdomen during the last visit.       2.  Elevated leukocytes.  Probably some element of infection           as the patient had some nonspecific fever and chills at home.       3.  Hypokalemia secondary to increased nausea and vomiting.       4.  Elevated amylase and lipase consistent with a           pancreatitis flare-up.       5.  Hypertension is elevated probably secondary to the           increased pain.  The patient claims that she has been           compliant with her home medicines.       6.  Dehydration.       7.  Elevated creatinine beyond her baseline likely           secondary to her poor p.o. intake as well as increased nausea           and vomiting.         PLAN:       1.  Admit the patient to the Medicine Service on tele.           The patient's potassium was replaced in the emergency room.           She will be checked for repeat  potassium, and she will be           started on IV fluids with normal saline with potassium           supplementation.  I am going to empirically start the patient       on IV antibiotics considering her complaints of fever and       chills and elevated white count with concern for infection in       the pseudocyst as she had been noted in the last visit.  The       patient will be placed n.p.o. She will be given ice chips and       will be hydrated.  She will be given her blood pressure  medicines if her pressure remains high with ice chips.       2.  Deep venous thrombosis prophylaxis with SCDs to the           lower extremities.  We will start the patient with IV fluid           replacement and we will continue to monitor renal function           closely.       3.  The anemia could be from chronic disease.  Her MCV is           noted to be normal.  We will check the stool for guaiac, and           she will be placed on IV Protonix for GI protection.       4.  We will seek a surgical consult.  The patient again in           the last visit with Dr. Nechama Guard and Dr. ______ were following           the patient for her acute on chronic pancreatitis.  I will           request the same service to follow the patient for her           relapse of this flareup.  The CAT scan of the abdomen and           pelvis at the moment is being done and the results are           pending.  Further management as per the results of those           findings.       5.  Pain control will be done with IV Dilaudid           judiciously, and she will be given Zofran for nausea.  She           will be placed on SCDs prophylaxis of DVT.             **PRELIMINARY REPORT UNLESS SIGNED**       SEE DOCUMENT IMAGING SYSTEM FOR FINAL REPORT         ________________________________                              Domenica Reamer, MD           ID:   [id number]       DocType:   [id number]       \:   UA       /:   [id number]       DD:  08/19/2010 06:22:41         DT:   08/19/2010 07:35:53       JOB: [id number]       >  Authenticated by Domenica Reamer, MD On 09/10/2010 10:07:12 PM

## 2012-09-01 NOTE — Consults (Signed)
[facility]                                 CONSULTATION              NAME: [name], [name]             ADMITTED:   09/21/2011       MR#:  [id number]                            ACCT#:      [id number]       DOB:  1960-10-04       _________________________________________________________________       DATE OF CONSULTATION:       09/27/2011              CONSULTING PHYSICIAN:       Rosiland Oz, MD              CONSULTATION REQUESTED BY:       Rodman Pickle, MD              REASON FOR CONSULT:       Enterococcal bacteremia and pancreatic pseudocyst.              HISTORY:       A 52 year old female who has a history of partial pancreatectomy       in [place] in October 2012.  This was due to       alcohol-related disease.  She was there approximately 2-1/2       months, had an IVC filter placed for DVT, and was admitted with       acute pancreatitis following apparently two alcoholic beverages       over [facility] Day.  She improved and was discharged on       09/18/2011 after approximately two weeks stay.  She was       discharged on Norco, Zofran, labetalol, plus gabapentin,       vitamins, omeprazole, Pancrease, MiraLAX for chronic       constipation.  However, she returned to the emergency room on       06/13 stating the pain had never completely resolved.  She was       complaining that of weakness, rigors, and progressive       discomfort, and her laboratory results at that time revealed       lipase of 2314.  Her creatinine was 0.85.  Electrolytes were       normal.  Her amylase was 894.  Her white count was 14.6 with 76%       polys and H and H of 15/44 with platelets of 865.  She was       placed on Zosyn and metronidazole.  She has been found to have       C. difficile toxin producing gene in her stool and placed on       oral vancomycin.  She is also found to have two blood cultures       positive for enterococcus ______ and vancomycin susceptible,        resistant to penicillin.  She has been receiving vancomycin 1 g       q.8 h. in addition to the oral vancomycin.  Her pain has       diminished since she differentiates the  pancreatic pain is up       under the xiphoid, almost feeling like when she was pregnant       that a baby's foot was there kicking at her.  She is not       vomiting and is tolerating oral fluids.  The Clostridium       difficile pain she notes is much more extreme in the       periumbilical area.  She is not having severe diarrhea at this       time and the rigors have resolved.              PAST MEDICAL HISTORY:       As per HPI.              PAST SURGICAL HISTORY:       As per HPI.              ALLERGIES:       PERCOCET.                     FAMILY HISTORY:       Unknown.  She was adopted.              SOCIAL HISTORY:       Prior alcohol abuse, has had none since the [facility] [place]       episode.  Does not use tobacco products.              REVIEW OF SYSTEMS:       As per HPI.  She is not having headaches or fevers.  She denies       rigors.  She is not vomiting.  No significant diarrhea.  She       would like to attend a funeral in two days if possible.  She       notes, she has poor IV access and want me to discuss PICC line.       She noted, she had some type of internal jugular catheter in her       neck previously.              OBJECTIVE:       GENERAL:  The patient is cooperative, coherent, alert, and       oriented.       SKIN:  Warm and dry without rash.       VITAL SIGNS:  Temperature is 36.2.       HEENT:  Mucous membranes moist.  No mouth ulcers or yeast.       Conjunctivae clear.       NECK:  Supple without adenopathy.  No JVD.       LUNGS:  Clear.  Respirations 17 and unlabored.  Saturation 97%       on room air.       HEART:  Rhythm is regular at 76 without murmur or rub.  BP       144/84.       ABDOMEN:  Slightly tympanitic, but not particularly tender,       although pressure on her abdomen, we can feel movement of fluid        through the bowel, and bowel sounds are present.  I cannot feel       an enlarged spleen or liver.       EXTREMITIES:  No edema.  She has IVs in both arms near the  antecubital fossa and a paucity of veins.              LABORATORY DATA:       Creatinine is 0.69 with a BUN of 3 and potassium of 3.4.  Her       lipase is now 146.  White count has dropped to 4.9 with 58%       polys.  Her H and H is 9.4/28.7.              ASSESSMENT:       1.  Acute on chronic pancreatitis, alcohol related.       2.  Enterococcal bacteremia, which is most likely           secondary to the pancreatitis.       3.  Clostridium difficile disease.              PLAN:       1.  Stop the Zosyn, stop the Flagyl IV, and continue           vancomycin.       2.  PICC line.       3.  Continue her oral vancomycin.       4.  Anticipated two week course of IV vancomycin, which           will last to July 1st.                                            **PRELIMINARY REPORT UNLESS SIGNED**                                SEE DOCUMENT IMAGING SYSTEM FOR FINAL REPORT                                                        ________________________________                                               Rosiland Oz, MD                                                                                                                  ID:   [id number]  DocType:   [id number]       \:   JP                                                                 /:   [id number]       DD:  09/27/2011 13:56:32                                                DT:  09/27/2011 16:08:30                                                JOB: [id number]                                                            CC:  PHYSICIAN NO PRIMARY (1002)            DR NO REFERRAL (1000)                   >                                                                  Authenticated by Rosiland Oz, MD On 10/02/2011 01:55:36 PM

## 2012-09-01 NOTE — Progress Notes (Signed)
[facility]                               FOLLOW-UP CLINIC              NAME: [name], [name]            DOB:         Sep 25, 1960       MR#:  [id number]                            ACCT#:      [id number]              _________________________________________________________________       DATE OF CLINIC VISIT:       07/22/2010              HOSPITAL DIAGNOSES:       Lower extremity numbness.              PAST MEDICAL HISTORY:       Includes chronic pancreatitis secondary to alcohol abuse and       gallstones, which she now has a cholecystectomy.              HISTORY OF PRESENT ILLNESS:       Ms. Vary is here today to establish primary care physician as       well as refill any medications that are needed.  She was noted       to be hypokalemic in the hospital with a potassium of 1.8.       Today, she is 2.9.  I have given her a list of high potassium       foods.  She states that she is doing well.  However, she still       has some right lower back pain.  I have given her Ultram 50 mg 1       p.o. q.8 h., only 10 of those, to see how she is doing in the       next 10 days or so.  I have given her names for primary care.       Should she have any increased complication, she can report to       the emergency department.  Otherwise, at this juncture, she is       doing well.              OBJECTIVE:       Sodium is 137, potassium 2.9, glucose 102, creatinine 0.8,       chloride 99, CO2 25, ionized calcium 4.6, H and H 10.9 and 32.       She is somewhat hypertensive, blood pressure 166/100,       respirations 20, O2 sat 96% on room air.  Heart rate 88 and       regular.              PLAN:       I have given [name] 10 Ultram to see how she tolerates the       medication, while she is waiting to get into a primary care.       Otherwise, should she have any difficulty she can report to the       emergency department.  I have discussed with Ms. Bunner the  need to stay  away from Tylenol products as well as alcohol.                            **PRELIMINARY REPORT UNLESS SIGNED**       SEE DOCUMENT IMAGING SYSTEM FOR FINAL REPORT                                 ________________________________                              Dorie Rank, CFNP                                            ID:   [id number]       DocType:   [id number]       \:   JR       /:   [id number]       DD:  07/22/2010 15:10:40       DT:  07/22/2010 16:52:10              JOB: [id number]       >  Authenticated by Wilburn Cornelia, CNP On 07/28/2010 09:46:42 AM

## 2012-09-01 NOTE — Consults (Signed)
[facility]                                     CONSULTATION         NAME: LOREAL, SCHUESSLER                     ADMITTED:   10/03/2010       MR#:  [id number]                                    ACCT#:      [id number]       DOB:  Jan 02, 1961       _________________________________________________________________________         [facility] ACCESS         SURGICAL CONSULTATION:           DATE OF CONSULTATION:       10/06/2010         HISTORY:       This is a 52 year old female, who is well known to our service.  She has       had multiple hospital admissions over the past several months with       complaints of chronic recurrent abdominal pain which is related to her       chronic alcoholic pancreatitis.  She has been documented on several       previous occasions to have a persistent fluid collection in the right       flank and retroperitoneum.  This fluid collection does not have a clear       connection to the pancreas; however, it has been aspirated on several       occasions generally with good relief of her discomfort.         PAST MEDICAL HISTORY:       1.  Chronic recurrent alcoholic pancreatitis.       2.  Recurrent fluid collection in the right lower quadrant and           retroperitoneum.       3.  Ongoing alcohol abuse.       4.  Asthma.       5.  Previous cholecystectomy.         IMAGING:       During this hospitalization again reveals a fluid collection in the       right flank and retroperitoneum tracking posterior to the ascending       colon into the pelvis.  Patient has a moderate amount of free       intraperitoneal fluid.  The pancreatic duct is diffusely dilated in a       [place]lakes appearance.         ASSESSMENT AND PLAN:       Chronic recurrent alcoholic pancreatitis with recurrent fluid collection       in the right flank and retroperitoneum.  At this point from the surgical       standpoint, there is nothing really to offer in a community setting.   I       would suggest repeat aspiration of the right flank fluid collection for       symptomatic relief, though I certainly would anticipate its recurrence       subsequently.  Definitive imaging of the  pancreas would include a       pancreatogram to achieve a road map of the pancreatic duct proper.  If       there is a persistent communication between the pancreatic duct and this       fluid collection, then stenting of the pancreatic duct may be of some       help.  This procedure is typically performed by Gastroenterology;       however, I suspect that given the complexity of the case, this would be       better done at a [facility] setting.             **PRELIMINARY REPORT UNLESS SIGNED**       SEE DOCUMENT IMAGING SYSTEM FOR FINAL REPORT           ________________________________                              Delena Bali, MD           ID:   [id number]       DocType:   [id number]       \:   DY       /:   [id number]       DD:  10/06/2010 17:15:11       DT:  10/07/2010 05:33:00       JOB: [id number]       >  Authenticated by Delena Bali.   MD On 11/11/2010 07:45:13 AM

## 2012-09-01 NOTE — Discharge Summary (Signed)
[facility]                               DISCHARGE SUMMARY         NAME: [name], [name]             ADMITTED:   08/28/2010       MR#:  [id number]                            DISCHARGED: 08/30/2010       DOB:  05/14/1960                           ACCT#:      [id number]       _________________________________________________________________       DISCHARGE DIAGNOSIS:       Acute on chronic pancreatitis.         SECONDARY DIAGNOSES:       1.  History of recurrent pancreatitis in the past.       2.  Alcohol abuse in the past, quit four months ago.       3.  Asthma.         BRIEF HISTORY OF PRESENT ILLNESS:       This is a 52 year old female who recently discharged from our       facility for acute on chronic pancreatitis, came in when she had       severe uncontrolled abdominal pain associated with nausea,       vomiting, and diarrhea.  Patient in the emergency department       found to have pancreatitis again, was admitted to the medical       floor, initially started on empirical IV antibiotics and GI       evaluation was sought.  For details, please refer to the HPI       dated 08/28/2010.         LABORATORIES AND IMAGING PRIOR TO DISCHARGE:       Glucose is 95, sodium is 135, potassium is 3.3, which will be       repleted prior to discharge, chloride is 102, bicarb is 24.1,       BUN is 11, and creatinine 0.69.  Lipase is 136.  Hemoglobin is       8.9, hematocrit is 26.8, which has been stable, and platelets       737.  Blood cultures here showed no growth.  Urine cultures have       been negative.  C. diff has been negative.  MRI of the abdomen       done on 05/21 shows resolving pancreatitis.         HOSPITAL COURSE:       This 52 year old female with history of alcoholic pancreatitis,       who recently quit alcohol, admitted to the hospital with       pancreatitis.  Patient does have history of chronic       pancreatitis, has been admitted to our hospital multiple  times.       Last time the patient was admitted, she was found to have       pseudocyst on the MRI.  Surgery was consulted, pseudocyst was       drained by IR, and patient was sent home, but when the  patient's pain       was not getting better, patient decided to come into the ER and       again had an MRI done, which showed resolving pancreatitis.  GI       was consulted again.  Patient initially kept n.p.o. with       vigorous IV hydration, then gradually started back on a regular       diet, which she seems to be tolerating well.  Patient started on       Dilaudid, which seemed to control her pain medication well.       Today, seen by GI and cleared for discharge.         DISCHARGE MEDICATIONS:       1.  Aldactone 25 mg once a day.       2.  B complex 1 mg oral daily.       3.  Aspirin 81 mg daily.       4.  Ferrous sulfate 325, three times a day.       5.  Lasix 20 mg daily.       6.  Lisinopril 5 mg daily.       7.  Lopressor 25 b.i.d.       8.  Pancrelipase 5000 mg oral with meal.         9.  KCl 20 mEq daily.       10.  Percocet 1 tab once a day.       11.  Thiamine 100 mg oral once a day.       12.  Colace 100 mg b.i.d.       13.  Tramadol 0.5 q.6 h.       14.  Dilaudid 4 mg oral q.8 h. p.r.n., dispensed 25 tablets.         DISCHARGE INSTRUCTIONS:       1.  I did check with the case manager.  Patient has an           appointment with [facility], Dr. Carmelina Noun scheduled in one week.           I am going to give her the Dilaudid supply at least for eight           days, so she can be seen by GI prior to her prescription           running out.       2.  Follow up with [facility] in one week to           recheck Chem 8.  We will try to make an appointment with [facility].         TIME SPENT:       Time spent on discharge including examining the patient,       dictating discharge summary is approximately 35 minutes.             **PRELIMINARY REPORT UNLESS SIGNED**       SEE DOCUMENT IMAGING  SYSTEM FOR FINAL REPORT         ________________________________                              Stann Ore, MD           ID:   [id number]  DocType:   [id number]       \:   RY       /:   [id number]       DD:  08/30/2010 10:48:13       DT:  08/31/2010 07:09:56       JOB: [id number]       CC:  Crissie Reese, MD ([id number])            [facility] [phone])       >  Authenticated and Linus Orn by Stann Ore, MD On 09/01/10 10:13:51 AM

## 2012-09-01 NOTE — ED Notes (Signed)
[facility]                          EMERGENCY DEPARTMENT REPORT              NAME: [name], [name]            DOB:         02-04-1961       MR#:  [id number]                            ACCT#:      [id number]              _________________________________________________________________       CHIEF COMPLAINT:       Abdominal pain.              HISTORY OF PRESENT ILLNESS:       This is a 52 year old female presents to the emergency       department complaining of abdominal pain.  She has a history of       chronic pancreatitis and chronic abdominal pain.  The patient       notes she has not had any drink in approximately 3 months.  The       patient came to the hospital and complained of pain in the       epigastrium as well mid abdomen.  She notes she had some chills.       She denies any history of any fevers.  She does note some mild       nausea earlier.  Denies any current vomiting.  She denies       history of any change in bowel or bladder.  No melena or       hematochezia.  She has otherwise been in good health.  She does       note that she was recently admitted for the same.  She notes she       has a history of pseudocyst.  She notes her increased.  She       notes the pain a 10/10.              REVIEW OF SYSTEMS:       No history of any numbness, tingling, paresthesias, headache,       dizziness, or blurriness.  No scotomata.  No chest pain.  She       denies being hit, punched, kicked, or struck.  She denies any       pain on the midline of the back.  She denies any history of any       pain along the midline cervical, thoracic, or lumbar spine.  No       flank pain.  No symptoms consistent with renal colic.  No       increasing arthralgias or myalgias.  No swelling along the joint       lines.  She denies any unintended weight loss or weight gain.       There has been no history of any odynophagia, dysphagia.  No       association with food.  All other review  of systems otherwise       negative.              ALLERGIES:       No known drug allergies.  MEDICATIONS:       Include Dilaudid, Lopressor, lisinopril, folic acid, thiamine,       Aldactone, Ecotrin, potassium supplement, Lasix, tramadol,       Prilosec.              PAST MEDICAL HISTORY:       Significant for hypokalemia, pancreatitis, chronic alcohol       abuse, asthma, cholecystitis, status post cholecystectomy,       history of pseudocyst.              FAMILY HISTORY:       Unknown. She is adopted.              SOCIAL HISTORY:       The patient does not currently drink.  She denies any illicit       drug use.  No cocaine or heroin.  She denies any current tobacco       use.  She lives at home.  She is not currently working per the       patient.                     VITAL SIGNS:       She is afebrile.  Blood pressure 156/111, respiratory rate 18,       pulse is 84, pulse ox 99% to 100% on room air.              PHYSICAL EXAM:       GENERAL:  This is a Caucasian 52 year old female sitting upright       in bed #1.  She makes good eye contact.  She is interactive.       HEENT:  Pupils equal, round, reactive to light.  Extraocular       movements are intact.  Nares are patent bilateral.  Oropharynx       is clear.  Throat is supple.  Trachea is midline.  No       thyromegaly or JVD.       CARDIAC:  Regular rhythm without murmur, rubs, or gallops.       LUNGS:  Clear bilaterally with good air movement.  No wheezes,       stridor.       ABDOMEN:  Soft, nondistended.  Active bowel sounds in all 4       quadrants.  There is tenderness along the epigastrium.  Mild       guarding, no rebound.       NEUROLOGIC:  5/5 muscle tone in bilateral extremities.  Pain       sensation is grossly intact.       PSYCHIATRIC:  No blunting of affect.  No pressured speech.       CHEST WALL:  Reveals symmetric chest wall rise.       LYMPHATICS:  There is increased lymphadenopathy.       BACK:  Cervical thoracic lumbar spine  nontender to palpation.       No step-off.  No CVA tenderness.              IMPRESSION AND PLAN:       A 52 year old female whose presentation is concerning for acute       on chronic pancreatitis.  It is necessary to consider possible       abscess formation versus pseudocyst.  No strong evidence to       support mesenteric ischemia or volvulus.  My suspicion for       pneumonia is relatively low.  No strong evidence to support       cardiac ischemia.              ANCILLARY SERVICES:       White count 7.5, hemoglobin 9.2, hematocrit 28.0, platelet count       824, segs 75, no bands noted.  LFTs reveal ALT 10, AST 19, total       bili 1.1, albumin 2.3, lipase 687, glucose 107.  Sodium 135,       potassium 2.5, chloride 93, bicarb 27, BUN 7, creatinine 1.4,       anion gap 19.  CT reveals pseudocyst with significant       pancreatitis.  No evidence of abscess.              EMERGENCY DEPARTMENT COURSE AND TREATMENT:       The patient remained stable throughout her stay.  She received       IV pain medication, IV normal saline.  The patient was admitted       per Dr. Kathi Ludwig.  She is resting comfortably.  I reviewed the       patient's old records extensively.  She was given p.o. potassium       with IV normal saline with K-rider.              CONDITION ON DISPOSITION:       Stable.              DIAGNOSES:       1.  Evaluation of acute pancreatitis with pseudocyst           formation.       2.  Acute hypokalemia.                                   **PRELIMINARY REPORT UNLESS SIGNED**       SEE DOCUMENT IMAGING SYSTEM FOR FINAL REPORT                                 ________________________________                              Joesphine Bare, MD                                            ID:   [id number]       DocType:   [id number]       \:   ER       /:   [id number]       DD:  08/19/2010 23:13:50       DT:  08/20/2010 03:25:13       JOB: [id number]       >  Authenticated by Joesphine Bare., MD On 10/20/2010 04:37:06 PM

## 2012-09-01 NOTE — H&P (Signed)
[facility]                             HISTORY AND PHYSICAL         NAME: [name], [name]             ADMITTED:   10/03/2010       MR#:  [id number]                            ACCT#:      [id number]       DOB:  1960-07-13       _________________________________________________________________       CHIEF COMPLAINT:       Abdominal crampy, sharp pain worsening over the last one day       with nausea.         HISTORY OF PRESENT ILLNESS:       This is a kindly, 52 year old female with known bouts of chronic       pancreatitis in the past who currently denies any alcohol use       but has had prior alcohol-related pancreatitis in the past as       well as a pseudocyst.  The patient, in any event, denies any       current diarrhea, melena, hematochezia, constipation, dysuria or       hematuria but only a kind of sharp diffuse abdominal pain with       crampy features as well with nausea and apparently a vomiting       episode.  The patient denies chills, rigors, associated back       pain, productive cough, shortness of breath or chest pain at       this time.  The patient denies dizziness, visual changes,       headaches or other acute complaints.         PAST MEDICAL HISTORY:       1.  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       2.  Alcohol use in the past having quit in early 2012.       3.  Asthma.       4.  Prior history of recurrent hypokalemia.       5.  Prior cholecystitis in January 2010.       6.  History of possible seroma in the left hepatic lobe per           abdominal ultrasound in the past.         FAMILY HISTORY:       Patient is adopted, unknown family history.         SOCIAL HISTORY:       Patient notes ongoing tobacco use with approximately a pack a       day over 30 years but wanting to quit.  The patient does note       heavy alcohol use in the past with last use the beginning of       2012.  No IV drug use.          REVIEW OF SYSTEMS:       Ten-point review of systems negative except as in HPI.         ALLERGIES:       No known  drug allergies.         MEDICATIONS:       On admission:         1.  Multivitamin tablet daily.       2.  Potassium 20 mEq daily.       3.  Prilosec 20 mg daily.       4.  Dilaudid 4 mg every 3 hours p.r.n.         PHYSICAL EXAMINATION:       VITAL SIGNS:  Temperature 98.1, pulse 89, respiratory rate 20,         blood pressure 157/107, pulse ox 99% on room air.       HEENT:  Pupils equal, react to light.  Extraocular movements       intact.  Head atraumatic, normocephalic. Ears note no visible       external auditory canal drainage or lesions bilaterally.  Nares       note no nasal congestion, nasal drainage, or lesions       bilaterally.  Oral cavity is moist without oral lesions.       NECK:  Supple.  No JVD.  No carotid bruits auscultated.       HEART:  S1, S2.  No murmurs.  Regular rate and rhythm.       LUNGS:  Clear to auscultation bilaterally without rales,       rhonchi, or wheezes audible.       BACK:  Notes no palpable vertebral or CVA tenderness       bilaterally.       GI:  Soft and mildly diffusely tender on deep palpation.  No       guarding or rigidity.  Bowel sounds positive all quadrants.       EXTREMITIES: Lower extremities note no edema, cyanosis,       clubbing.  Dorsal pedal pulses faintly palpable bilaterally.       NEUROLOGIC:  Exam is grossly nonfocal.  Cranial nerves 2-12       intact.  No motor or sensory deficits elicitable on physical       exam.       MUSCULOSKELETAL:  Adequate range of motion all extremities.       PSYCHIATRIC:  Alert and oriented x3.       GENERAL:  This is a female appearing her stated age without any       obvious cardiac or respiratory distress with reasonably control       abdominal pain without further nausea and vomiting at this time.         LABORATORY DATA:       EKG shows sinus rhythm, rate of 74 with no other ST-T       abnormalities.          Albumin is 2.2, alk phos is 225, ALT is 8, AST is 14, total       bilirubin is 0.6, direct bilirubin 0.3.  Glucose 100, sodium       136, potassium 3.3, chloride of 96, bicarb 24, BUN of 3,       creatinine 0.67, osmolality is 272, calcium is 8.6.  GFR is       greater than 60. lipase is 343.  Urinalysis is negative.  PT is       14.8, INR is 1.4, PTT is 28.7.         Prior chest x-ray on 10/02/2010 shows probable mild atelectasis  at both bases versus inflammation.         A current CT of the abdomen and pelvis with IV contrast shows       acute pancreatitis with pancreatic fusion and moderate free       peritoneal fluid in a loculated right pericolic gutter       collection.         WBC count is 8.0, hemoglobin 11.9, hematocrit 35.7, platelets of       468, neutrophils 77%, no bands.         IMPRESSION:       This is a 52 year old female who notes no evidence of chills or       rigors but only abdominal pain with nausea.  There is CT       evidence of a loculated effusion in the right pericolic gutter       with ongoing acute pancreatitis at this time but no active       nausea, vomiting or abdominal pain complaints.         PLAN:       1.  Acute pancreatitis with loculated right pericolic           gutter fluid.  There is no overt evidence to suggest an           infectious process.  Will compare and possibly get           Interventional Radiology sampling if needed.  Will continue             Dilaudid for pain control as well Zofran for nausea at this           time.  Will keep the patient n.p.o. with IV fluids for           support and recommend Creon in a t.i.d. fashion with meals as           well.  We will start empirically at this time and give           Protonix IV in a  b.i.d. fashion.       2.  History of alcohol use in the past.  Would give           multivitamin, thiamine and folic acid supplementation.       3.  History of asthma with ongoing tobacco abuse.  Would give           a Nicoderm patch  and nebulizers as needed.       4.  Mildly elevated alkaline phosphatase.  Will monitor on a           daily basis her LFTs as well as lipase.       5.  Mild hypokalemia.  Replete with IV fluids.       6.  Prophylaxis with IV Protonix and lower extremity           compression devices.         TOTAL TIME SPENT:       Thirty-five minutes.         PRIMARY CARE:       [facility].             **PRELIMINARY REPORT UNLESS SIGNED**       SEE DOCUMENT IMAGING SYSTEM FOR FINAL REPORT         ________________________________  Aundria Mems, MD           ID:   [id number]       DocType:   [id number]       \:   UA       /:   [id number]       DD:  10/03/2010 03:13:18       DT:  10/03/2010 04:24:15       JOB: [id number]            [facility] ([id number])       >  Authenticated by Elenor Quinones On 10/03/2010 07:28:44 AM

## 2012-09-01 NOTE — H&P (Signed)
[facility]                             HISTORY AND PHYSICAL         NAME: [name], [name]             ADMITTED:   07/12/2010       MR#:  [id number]                            ACCT#:      [id number]       DOB:  1960/06/28       _________________________________________________________________       PRIMARY CARE DOCTOR:       None.         TYPE OF ADMISSION:       Inpatient telemetry.         CHIEF COMPLAINT:       Numbness and cramping in the upper and lower extremities.         HISTORY OF PRESENT ILLNESS:       The patient is a 52 year old lady with a history of chronic       alcoholic pancreatitis with asthma, status post cholecystectomy       for gallstones, who was treated for pneumonia about a month ago       with Levaquin for one week, who came in here complaining of       tingling, numbness, nausea, and vomiting.  The patient says she       has been having diarrhea, 1-2 very loose bowel movements every       day with no mucus, blood, or pain in them.  She says she had       them even before she took the antibiotics.  She usually has       these, but they have been more pronounced since she took the       antibiotics and she has some cramps before she has a bowel       movement.  She had been having nausea and vomiting yesterday,       but none today.  She has been eating and drinking okay she says       until she came to the ER except for last night when she was       having episodes of nausea and vomiting.  Denies having any       abdominal pain at all.  She says she has some pain in the left       infra-axillary extending down into the back area and it gets       worse whenever she coughs.  It got better with the pain       medication she got in the ER.  Movement or coughing, or sneezing       makes the pain worse.  The pain has no other radiation anywhere,       no other alleviating or aggravating factors, and is present       constantly all the time.  It can be  severe ranging anywhere from       6-8/10.  In addition, the patient was also starting to feel       dizzy whenever she stands up and is having cramps in her upper       extremity mainly in the hands and  fingers where she had to       manually straighten her fingers.  She is also noticing that in       the left leg she is starting to feel cramps, but more than       cramps, numbness in her legs.  She said when she sat on the       toilet bowl she could not get up because her legs were       completely numb.  Her cough is mostly productive with clear       phlegm, but it has been significantly better than what it was       when she had pneumonia and whenever she coughs she has pain.       She has also been having lower extremity edema and has taken       over-the-counter diuretics and over-the-counter potassium       supplements to help with ______.  There is some improvement in       the lower extremity swelling since she took this       over-the-counter diuretic tablets.  She does not take her       over-the-counter potassium or vitamin supplements on a regular       basis, maybe once a week when she remembers to.  She has       continued to drink alcohol and smoke.  At this point, the       patient is being admitted predominantly secondary to her severe       hypokalemia.           PAST MEDICAL HISTORY:       Chronic pancreatitis, initially with gallstones, now probably       secondary to alcohol with ongoing diarrhea.  Asthma, stable.       Alcohol abuse.  History of gallstones, had a cholecystectomy       done.         ALLERGIES:       No known drug allergies.         MEDICATIONS AS OUTPATIENT:       Include Prilosec, over-the-counter diuretic, over-the-counter       potassium supplements, and over-the-counter multivitamin       tablets.  She got a course of Levaquin about a month ago for       pneumonia.         FAMILY HISTORY:       No family history, she has been adopted.  She does not know any       of her  biological family history.         SOCIAL HISTORY:       Smokes one pack per day for over 35 years.  Drinks alcohol on       average of two drinks a day, but lately she says she drank more.         REVIEW OF SYSTEMS:       Dizziness, upper extremity cramps, lower extremity numbness,       nausea, vomiting, cough with clear productive phlegm,       right-sided chest pain, lower extremity edema, and otherwise the       remaining 11 review of systems is negative.         PHYSICAL EXAMINATION:       VITAL SIGNS:  Temperature 36.4, pulse 84, respiratory rate 21,       saturating 96%,  blood pressure 120/47, pain score 9/10.       GENERAL EXAMINATION:  No acute distress.  Patient lying       comfortably in the bed.       HEENT:  Pupils equal, reactive to light.  Moist mucous       membranes.  No pallor or icterus.  No maxillary sinus       tenderness.  External auditory canals normal.       NECK:  Supple.       LUNGS:  Clear to auscultation bilaterally.       CARDIOVASCULAR:  Regular rate and rhythm.  Normal S1, S2.       ABDOMEN:  Bowel sounds present.  Soft, nontender, nondistended.       EXTREMITIES:  Show bilateral pitting pedal edema.  No cyanosis       or clubbing.       CNS:  Nonfocal neurological exam.       PSYCH:  Patient is alert, oriented x3 with normal affect.       SKIN:  Visible skin is intact.  No ulcers.       MUSCULOSKELETAL:  Passive range of motion of knees, elbows,       ankles, wrists is normal.         LABS:       Lipase 264.  Glucose 130, sodium 135, potassium 1.8, chloride       81, bicarb 42.8, BUN 3, creatinine 0.8, calcium 8.4, albumin       2.1, globulin 4.5, alk phos 256, ALT 26, AST 76, total bili 1.3,       direct bili 0.5, magnesium 1.7.         EKG sinus rhythm, nonspecific septal and lateral ST changes.         WBC 14.4, hemoglobin 13.3, hematocrit 39.5, platelets 511,       neutrophils 93%.           ASSESSMENT:       The patient is a 52 year old lady with a history of chronic        pancreatitis with diarrhea, with stable asthma, ongoing alcohol       and tobacco abuse, who came in with ______ and cramps, being       admitted for severe hypokalemia in the setting of diarrhea.         PROBLEM LIST:       1.  Hypokalemia.       2.  Diarrhea, rule out C. diff colitis, especially in the           setting of leukocytosis.       3.  Elevated lipase, probable mild acute on chronic           pancreatitis.       4.  Jaundice with elevated alkaline phosphatase and AST,           probably related to alcohol abuse.       5.  Continuous tobacco and alcohol abuse.       6.  Stable asthma.         PLAN:       At this point is to aggressively replace potassium, give oral       magnesium, check stool for C. diff.  Per patient, she currently       does not have any nausea, vomiting, or abdominal pain now, and       prefers to  be on regular diet which we continuing.  Will check       B12 and TSH with repeat Chem 14 in the a.m.  Check electrolytes       q.12 h. for one day.  Start Protonix and Ativan p.r.n. per CIWA       protocol.  The patient will be on telemetry secondary to       hypokalemia.             **PRELIMINARY REPORT UNLESS SIGNED**       SEE DOCUMENT IMAGING SYSTEM FOR FINAL REPORT         ________________________________                              Hadassah Pais, MD           ID:   [id number]       DocType:   [id number]       \:   NG       /:   [id number]       DD:  07/12/2010 09:57:10       DT:  07/12/2010 11:00:24       JOB: [id number]       >  Authenticated by Hadassah Pais, MD On 07/25/2010 09:03:07 AM

## 2012-12-26 ENCOUNTER — Ambulatory Visit (HOSPITAL_BASED_OUTPATIENT_CLINIC_OR_DEPARTMENT_OTHER): Payer: Self-pay | Admitting: Gastroenterology

## 2012-12-26 ENCOUNTER — Encounter (HOSPITAL_BASED_OUTPATIENT_CLINIC_OR_DEPARTMENT_OTHER): Payer: Self-pay | Admitting: Anesthesiology

## 2012-12-26 ENCOUNTER — Ambulatory Visit (HOSPITAL_BASED_OUTPATIENT_CLINIC_OR_DEPARTMENT_OTHER): Payer: Self-pay | Admitting: Anesthesiology

## 2012-12-26 ENCOUNTER — Encounter (HOSPITAL_BASED_OUTPATIENT_CLINIC_OR_DEPARTMENT_OTHER): Payer: Self-pay

## 2012-12-26 ENCOUNTER — Ambulatory Visit
Admission: RE | Admit: 2012-12-26 | Discharge: 2012-12-26 | Disposition: A | Discharge status: Home / Self Care | Payer: Self-pay | Source: Ambulatory Visit | Attending: Gastroenterology | Admitting: Gastroenterology

## 2012-12-26 ENCOUNTER — Encounter (HOSPITAL_BASED_OUTPATIENT_CLINIC_OR_DEPARTMENT_OTHER)
Admission: RE | Disposition: A | Discharge status: Home / Self Care | Payer: Self-pay | Source: Ambulatory Visit | Attending: Gastroenterology

## 2012-12-26 DIAGNOSIS — Z1211 Encounter for screening for malignant neoplasm of colon: Secondary | ICD-10-CM | POA: Insufficient documentation

## 2012-12-26 DIAGNOSIS — K621 Rectal polyp: Secondary | ICD-10-CM | POA: Insufficient documentation

## 2012-12-26 DIAGNOSIS — J45909 Unspecified asthma, uncomplicated: Secondary | ICD-10-CM | POA: Insufficient documentation

## 2012-12-26 DIAGNOSIS — Z79899 Other long term (current) drug therapy: Secondary | ICD-10-CM | POA: Insufficient documentation

## 2012-12-26 DIAGNOSIS — D126 Benign neoplasm of colon, unspecified: Secondary | ICD-10-CM | POA: Insufficient documentation

## 2012-12-26 DIAGNOSIS — I10 Essential (primary) hypertension: Secondary | ICD-10-CM | POA: Insufficient documentation

## 2012-12-26 DIAGNOSIS — K620 Anal polyp: Secondary | ICD-10-CM | POA: Insufficient documentation

## 2012-12-26 HISTORY — DX: Unspecified asthma, uncomplicated: J45.909

## 2012-12-26 HISTORY — DX: Essential (primary) hypertension: I10

## 2012-12-26 HISTORY — PX: COLONOSCOPY, POLYPECTOMY: SHX3473

## 2012-12-26 HISTORY — PX: COLONOSCOPY: SHX174

## 2012-12-26 LAB — HM COLONOSCOPY

## 2012-12-26 SURGERY — DONT USE, USE 1094-COLONOSCOPY, DIAGNOSTIC (SCREENING)
Anesthesia: Anesthesia MAC / Sedation | Site: Abdomen | Wound class: Clean Contaminated

## 2012-12-26 MED ORDER — SODIUM CHLORIDE 0.9 % IV SOLN
INTRAVENOUS | Status: DC
Start: 2012-12-26 — End: 2012-12-26

## 2012-12-26 MED ORDER — FENTANYL CITRATE 0.05 MG/ML IJ SOLN
INTRAMUSCULAR | Status: AC
Start: 2012-12-26 — End: ?
  Filled 2012-12-26: qty 2

## 2012-12-26 MED ORDER — PROPOFOL 10 MG/ML IV EMUL
INTRAVENOUS | Status: DC | PRN
Start: 2012-12-26 — End: 2012-12-26
  Administered 2012-12-26 (×4): 50 mg via INTRAVENOUS

## 2012-12-26 MED ORDER — SODIUM CHLORIDE 0.9 % IV SOLN
INTRAVENOUS | Status: DC | PRN
Start: 2012-12-26 — End: 2012-12-26

## 2012-12-26 MED ORDER — FENTANYL CITRATE 0.05 MG/ML IJ SOLN
INTRAMUSCULAR | Status: DC | PRN
Start: 2012-12-26 — End: 2012-12-26
  Administered 2012-12-26: 100 ug via INTRAVENOUS

## 2012-12-26 SURGICAL SUPPLY — 7 items
CONN IRRG TORRENT 1WAYVAL ENDO (Supply) ×2 IMPLANT
FORCEP BIOPSY HOT RADIAL JAW 4 (Supply) IMPLANT
FORCEP BIOPSY RAD JAW 1333-40 (Supply) IMPLANT
FORCEP RADIAL JAW JUMBO 240CM (Supply) ×2 IMPLANT
SNARE ROTATE SM OVAL MED STFF (Supply) IMPLANT
SNARE SMALL CAPTIV 6230 (Supply) ×2 IMPLANT
TUBING IRRIGATION TORRENT (Supply) ×2 IMPLANT

## 2012-12-26 NOTE — Discharge Summary (Signed)
DISCHARGE NOTE:    The patient underwent the following outpatient endoscopic procedure with results recorded below:     PROCEDURE: COLONOSCOPY w/SNARE POLYPECTOMY     ENDOSCOPIC IMPRESSIONS  - Two 3 - 13 mm polyps  - Dilated vaso-vasorum of the ano-rectum.    RECOMMENDATIONS:  - Discharge to home via wheelchair after patient meets discharge criteria  - Await histopathology results  - If either of the polyps is adenomatous, then I recommend:   *Colonoscopy in 3-5 years   *Colonoscopy for all first degree family members, beginning at age 74   *If family members need access to colonoscopy services, call [phone],                         extension [phone] to speak to [place].   - If neither of the polyps is adenomatous, then colonoscopy is needed in 10 years  - I will contact her in about 14 days with the histology results and final recommendations.      - The hemorrhoids found on today's examination require no intervention or therapy.  - Follow-up with referring MD as previously arranged  - Return to GI clinic on an as needed basis.          Prior to discharge, I reviewed verbally the endoscopic findings and my recommendations.  After completion of adequate period of observation post procedure, the patient was discharged to home in stable condition.    A printed copy of instructions as well as a printed copy of the report were given to the patient prior to discharge.

## 2012-12-26 NOTE — Brief Op Note (Signed)
The patient underwent endoscopic procedure with results fully documented in image copy generated by the endowriter report generator ([employer]).  This image was imported into EPIC system, but the results may or may not appear due to unreliability of the interface. If it does appear, it is typically located in "MEDIA" section of the record.  Therefore, for good communication, I have included below a brief summary of the endoscopic assessment and my recommendations:    PROCEDURE: COLONOSCOPY w/SNARE POLYPECTOMY     ENDOSCOPIC IMPRESSIONS  - Two 3 - 13 mm polyps  - Dilated vaso-vasorum of the ano-rectum.    RECOMMENDATIONS:  - Discharge to home via wheelchair after patient meets discharge criteria  - Await histopathology results  - If either of the polyps is adenomatous, then I recommend:   *Colonoscopy in 3-5 years   *Colonoscopy for all first degree family members, beginning at age 82   *If family members need access to colonoscopy services, call [phone],                         extension [phone] to speak to [place].   - If neither of the polyps is adenomatous, then colonoscopy is needed in 10 years  - I will contact her in about 14 days with the histology results and final recommendations.      - The hemorrhoids found on today's examination require no intervention or therapy.  - Follow-up with referring MD as previously arranged  - Return to GI clinic on an as needed basis.

## 2012-12-26 NOTE — Anesthesia Preprocedure Evaluation (Signed)
Anesthesia Evaluation    AIRWAY    Mallampati: II    TM distance: >3 FB  Neck ROM: full  Mouth Opening:full   CARDIOVASCULAR    cardiovascular exam normal, regular and normal       DENTAL    No notable dental hx     PULMONARY    pulmonary exam normal and clear to auscultation     OTHER FINDINGS                      Anesthesia Plan    ASA 2     MAC                     intravenous induction   Detailed anesthesia plan: MAC            informed consent obtained      pertinent labs reviewed

## 2012-12-26 NOTE — Discharge Instructions (Signed)
Endoscopy Discharge Instructions  General Instructions:  1. Following sedation, your judgement, perception, and coordination are considered impaired. Even though you may feel awake and alert, you are considered legally intoxicated. Therefore, until the next morning;   Do not Drive   Do not operate appliances or equipment that requires reaction time (e.g. stove, electrical tools, machinery)   Do not sign legal documents or be involved in important decisions.   Do not smoke if alone   Do not drink alcoholic beverages   Go directly home and rest for several hours before resuming your routine activities.   It is highly recommended to have a responsible adult stay with you for the next 24 hours    2. Tenderness, swelling or pain may occur at the IV site where you received sedation. If you experience this, apply warm soaks to the area. Notify your physician if this persists.    3. If biopsies and/or polypectomies were performed, your physician should receive results in 3-4 business days.  After the results are received, your physician will prepare a letter explaining significance of the results and any new recommendations.  Since it takes time to analyze the results, draft a letter and mail it to you, it typically takes about 5-7 business days for you to receive that letter.    4. If you take any medication that thins the blood (some examples include; Aspirin, Coumadin, Pradaxa, Xaretlo, Plavix), you should resume that medication routinely as of today. This does increase your bleeding risk to a small extent, but it is safer than developing a stroke or heart attack.     5. All the recommendations that your physician made regarding your procedure are recorded on the bottom of the procedure report.  We gave you a copy of this report prior to your discharge.  Please refer to that report for any additional recommendations!!    Instructions Specific To Procedures - Report To Physician Any Of The  Following:     Colon/Sigmoidoscopy/Proctoscopy   1. Severe and persistent abdominal pain/bloating which does not subside within 2-3 hours   2. Large amount of rectal bleeding (some mucosal blood streaking may occur, especially if biopsy or polypectomy was done or if hemorrhoids are present.   3. Nausea/vomiting   4. Fevers/Chills within 24 hours after procedure. Temp>101deg F      If you have questions or problems contact your MD immediately. If you need immediate attention, call your MD, 911 and/or go to nearest emergency room.

## 2012-12-26 NOTE — Anesthesia Postprocedure Evaluation (Signed)
Anesthesia Post Evaluation    Patient: [name]    Procedures performed: Procedure(s) with comments:  COLONOSCOPY  COLONOSCOPY, POLYPECTOMY    Anesthesia type: MAC    Patient location:Outpatient    Last vitals:   Filed Vitals:    12/26/12 1210   BP: 113/70   Pulse: 68   Resp:    SpO2: 97%       Post pain: Patient not complaining of pain, continue current therapy      Mental Status:awake    Respiratory Function: tolerating room air    Cardiovascular: stable    Nausea/Vomiting: patient not complaining of nausea or vomiting    Hydration Status: adequate    Post assessment: no apparent anesthetic complications

## 2012-12-26 NOTE — Transfer of Care (Signed)
Anesthesia Transfer of Care Note    Patient: [name]    Last vitals:   Filed Vitals:    12/26/12 1210   BP: 113/70   Pulse: 68   Resp:    SpO2: 97%       Oxygen: Nasal Cannula     Mental Status:awake    Airway: Natural    Cardiovascular Status:  stable

## 2012-12-27 ENCOUNTER — Encounter (HOSPITAL_BASED_OUTPATIENT_CLINIC_OR_DEPARTMENT_OTHER): Payer: Self-pay | Admitting: Gastroenterology

## 2013-04-10 DIAGNOSIS — K8591 Acute pancreatitis with uninfected necrosis, unspecified: Secondary | ICD-10-CM

## 2013-04-10 HISTORY — DX: Acute pancreatitis with uninfected necrosis, unspecified: K85.91

## 2013-05-16 LAB — ECG 12-LEAD
P Wave Axis: 54 deg
P Wave Axis: 66 deg
P Wave Axis: 73 deg
P Wave Axis: 72 deg
P Wave Axis: 72 deg
P Wave Duration: 110 ms
P Wave Duration: 114 ms
P Wave Duration: 100 ms
P Wave Duration: 100 ms
P Wave Duration: 96 ms
P-R Interval: 162 ms
P-R Interval: 170 ms
P-R Interval: 148 ms
P-R Interval: 156 ms
P-R Interval: 154 ms
Patient Age: 49 years
Patient Age: 49 years
Patient Age: 49 years
Patient Age: 49 years
Patient Age: 50 years
Q-T Dispersion: 18 ms
Q-T Dispersion: 24 ms
Q-T Dispersion: 52 ms
Q-T Dispersion: 54 ms
Q-T Dispersion: 18 ms
Q-T Interval(Corrected): 434 ms
Q-T Interval(Corrected): 480 ms
Q-T Interval(Corrected): 479 ms
Q-T Interval(Corrected): 381 ms
Q-T Interval(Corrected): 434 ms
Q-T Interval: 352 ms
Q-T Interval: 472 ms
Q-T Interval: 460 ms
Q-T Interval: 334 ms
Q-T Interval: 410 ms
QRS Axis: 47 deg
QRS Axis: 6 deg
QRS Axis: 36 deg
QRS Axis: 18 deg
QRS Axis: 34 deg
QRS Duration: 96 ms
QRS Duration: 98 ms
QRS Duration: 102 ms
QRS Duration: 100 ms
QRS Duration: 92 ms
T Axis: 23 deg
T Axis: 51 deg
T Axis: 41 deg
T Axis: 55 deg
T Axis: 51 deg
Ventricular Rate: 107 /min
Ventricular Rate: 65 /min
Ventricular Rate: 71 /min
Ventricular Rate: 87 /min
Ventricular Rate: 74 /min

## 2014-04-11 ENCOUNTER — Inpatient Hospital Stay
Admission: EM | Admit: 2014-04-11 | Discharge: 2014-04-14 | DRG: 438 | Disposition: A | Discharge status: Home / Self Care | Payer: Commercial Managed Care - POS | Attending: Internal Medicine | Admitting: Internal Medicine

## 2014-04-11 ENCOUNTER — Inpatient Hospital Stay: Payer: Self-pay | Admitting: Internal Medicine

## 2014-04-11 DIAGNOSIS — Z90411 Acquired partial absence of pancreas: Secondary | ICD-10-CM | POA: Diagnosis present

## 2014-04-11 DIAGNOSIS — K852 Alcohol induced acute pancreatitis without necrosis or infection: Secondary | ICD-10-CM | POA: Diagnosis present

## 2014-04-11 DIAGNOSIS — R112 Nausea with vomiting, unspecified: Secondary | ICD-10-CM

## 2014-04-11 DIAGNOSIS — K859 Acute pancreatitis without necrosis or infection, unspecified: Secondary | ICD-10-CM

## 2014-04-11 DIAGNOSIS — I160 Hypertensive urgency: Secondary | ICD-10-CM | POA: Diagnosis present

## 2014-04-11 DIAGNOSIS — K860 Alcohol-induced chronic pancreatitis: Secondary | ICD-10-CM | POA: Diagnosis present

## 2014-04-11 DIAGNOSIS — F17210 Nicotine dependence, cigarettes, uncomplicated: Secondary | ICD-10-CM | POA: Diagnosis present

## 2014-04-11 DIAGNOSIS — J189 Pneumonia, unspecified organism: Secondary | ICD-10-CM | POA: Diagnosis present

## 2014-04-11 DIAGNOSIS — I10 Essential (primary) hypertension: Secondary | ICD-10-CM | POA: Diagnosis present

## 2014-04-11 DIAGNOSIS — E876 Hypokalemia: Secondary | ICD-10-CM | POA: Diagnosis present

## 2014-04-11 DIAGNOSIS — J45909 Unspecified asthma, uncomplicated: Secondary | ICD-10-CM | POA: Diagnosis present

## 2014-04-11 DIAGNOSIS — Z9049 Acquired absence of other specified parts of digestive tract: Secondary | ICD-10-CM | POA: Diagnosis present

## 2014-04-11 DIAGNOSIS — F10239 Alcohol dependence with withdrawal, unspecified: Secondary | ICD-10-CM | POA: Diagnosis present

## 2014-04-11 DIAGNOSIS — K7010 Alcoholic hepatitis without ascites: Secondary | ICD-10-CM | POA: Diagnosis present

## 2014-04-11 DIAGNOSIS — R1013 Epigastric pain: Secondary | ICD-10-CM

## 2014-04-11 DIAGNOSIS — Z86718 Personal history of other venous thrombosis and embolism: Secondary | ICD-10-CM

## 2014-04-11 HISTORY — DX: Acute pancreatitis without necrosis or infection, unspecified: K85.90

## 2014-04-11 HISTORY — DX: Alcohol abuse, in remission: F10.11

## 2014-04-11 HISTORY — DX: Acute embolism and thrombosis of unspecified deep veins of unspecified lower extremity: I82.409

## 2014-04-11 HISTORY — DX: Gastro-esophageal reflux disease without esophagitis: K21.9

## 2014-04-11 LAB — COMPREHENSIVE METABOLIC PANEL
ALT: 64 U/L — ABNORMAL HIGH (ref 0–55)
ALT: 53 U/L (ref 0–55)
AST (SGOT): 59 U/L — ABNORMAL HIGH (ref 10–42)
AST (SGOT): 45 U/L — ABNORMAL HIGH (ref 10–42)
Albumin/Globulin Ratio: 1.25 Ratio (ref 0.70–1.50)
Albumin/Globulin Ratio: 1.26 Ratio (ref 0.70–1.50)
Albumin: 4.5 gm/dL (ref 3.5–5.0)
Albumin: 3.9 gm/dL (ref 3.5–5.0)
Alkaline Phosphatase: 247 U/L — ABNORMAL HIGH (ref 40–145)
Alkaline Phosphatase: 219 U/L — ABNORMAL HIGH (ref 40–145)
Anion Gap: 18.6 mMol/L — ABNORMAL HIGH (ref 7.0–18.0)
Anion Gap: 15.9 mMol/L (ref 7.0–18.0)
BUN / Creatinine Ratio: 10 Ratio (ref 10.0–30.0)
BUN / Creatinine Ratio: 12.5 Ratio (ref 10.0–30.0)
BUN: 9 mg/dL (ref 7–22)
BUN: 10 mg/dL (ref 7–22)
Bilirubin, Total: 1.8 mg/dL — ABNORMAL HIGH (ref 0.1–1.2)
Bilirubin, Total: 1.4 mg/dL — ABNORMAL HIGH (ref 0.1–1.2)
CO2: 28.8 mMol/L (ref 20.0–30.0)
CO2: 26.8 mMol/L (ref 20.0–30.0)
Calcium: 10.1 mg/dL (ref 8.5–10.5)
Calcium: 9.4 mg/dL (ref 8.5–10.5)
Chloride: 96 mMol/L — ABNORMAL LOW (ref 98–110)
Chloride: 100 mMol/L (ref 98–110)
Creatinine: 0.9 mg/dL (ref 0.60–1.20)
Creatinine: 0.8 mg/dL (ref 0.60–1.20)
EGFR: >60 mL/min/{1.73_m2}
EGFR: >60 mL/min/{1.73_m2}
Globulin: 3.6 gm/dL (ref 2.0–4.0)
Globulin: 3.1 gm/dL (ref 2.0–4.0)
Glucose: 126 mg/dL — ABNORMAL HIGH (ref 70–99)
Glucose: 121 mg/dL — ABNORMAL HIGH (ref 70–99)
Osmolality Calc: 281 mOsm/kg (ref 275–300)
Osmolality Calc: 280 mOsm/kg (ref 275–300)
Potassium: 2.4 mMol/L — CL (ref 3.5–5.3)
Potassium: 2.7 mMol/L — CL (ref 3.5–5.3)
Protein, Total: 8.1 gm/dL (ref 6.0–8.3)
Protein, Total: 7 gm/dL (ref 6.0–8.3)
Sodium: 141 mMol/L (ref 136–147)
Sodium: 140 mMol/L (ref 136–147)

## 2014-04-11 LAB — CBC AND DIFFERENTIAL
Basophils %: 0.8 % (ref 0.0–3.0)
Basophils %: 0.6 % (ref 0.0–3.0)
Basophils Absolute: 0.1 10*3/uL (ref 0.0–0.3)
Basophils Absolute: 0.1 10*3/uL (ref 0.0–0.3)
Eosinophils %: 0.6 % (ref 0.0–7.0)
Eosinophils %: 0.8 % (ref 0.0–7.0)
Eosinophils Absolute: 0.1 10*3/uL (ref 0.0–0.8)
Eosinophils Absolute: 0.1 10*3/uL (ref 0.0–0.8)
Hematocrit: 48.3 % — ABNORMAL HIGH (ref 36.0–48.0)
Hematocrit: 45.1 % (ref 36.0–48.0)
Hemoglobin: 16.5 gm/dL — ABNORMAL HIGH (ref 12.0–16.0)
Hemoglobin: 15.3 gm/dL (ref 12.0–16.0)
Lymphocytes Absolute: 1.8 10*3/uL (ref 0.6–5.1)
Lymphocytes Absolute: 2.1 10*3/uL (ref 0.6–5.1)
Lymphocytes: 16.2 % (ref 15.0–46.0)
Lymphocytes: 18 % (ref 15.0–46.0)
MCH: 38 pg — ABNORMAL HIGH (ref 28–35)
MCH: 38 pg — ABNORMAL HIGH (ref 28–35)
MCHC: 34 gm/dL (ref 32–36)
MCHC: 34 gm/dL (ref 32–36)
MCV: 111 fL — ABNORMAL HIGH (ref 80–100)
MCV: 113 fL — ABNORMAL HIGH (ref 80–100)
MPV: 6.6 fL (ref 6.0–10.0)
MPV: 6.6 fL (ref 6.0–10.0)
Monocytes Absolute: 0.6 10*3/uL (ref 0.1–1.7)
Monocytes Absolute: 0.7 10*3/uL (ref 0.1–1.7)
Monocytes: 5.6 % (ref 3.0–15.0)
Monocytes: 6.1 % (ref 3.0–15.0)
Neutrophils %: 76.8 % (ref 42.0–78.0)
Neutrophils %: 74.6 % (ref 42.0–78.0)
Neutrophils Absolute: 8.4 10*3/uL (ref 1.7–8.6)
Neutrophils Absolute: 8.9 10*3/uL — ABNORMAL HIGH (ref 1.7–8.6)
PLT CT: 342 10*3/uL (ref 130–440)
PLT CT: 310 10*3/uL (ref 130–440)
RBC: 4.37 10*6/uL (ref 3.80–5.00)
RBC: 3.99 10*6/uL (ref 3.80–5.00)
RDW: 13.8 % (ref 11.0–14.0)
RDW: 13.7 % (ref 11.0–14.0)
WBC: 11 10*3/uL (ref 4.0–11.0)
WBC: 11.9 10*3/uL — ABNORMAL HIGH (ref 4.0–11.0)

## 2014-04-11 LAB — BASIC METABOLIC PANEL
Anion Gap: 15.1 mMol/L (ref 7.0–18.0)
BUN / Creatinine Ratio: 12.8 Ratio (ref 10.0–30.0)
BUN: 10 mg/dL (ref 7–22)
CO2: 28.9 mMol/L (ref 20.0–30.0)
Calcium: 8.8 mg/dL (ref 8.5–10.5)
Chloride: 99 mMol/L (ref 98–110)
Creatinine: 0.78 mg/dL (ref 0.60–1.20)
EGFR: >60 mL/min/{1.73_m2}
Glucose: 101 mg/dL — ABNORMAL HIGH (ref 70–99)
Osmolality Calc: 279 mOsm/kg (ref 275–300)
Potassium: 3 mMol/L — CL (ref 3.5–5.3)
Sodium: 140 mMol/L (ref 136–147)

## 2014-04-11 LAB — URINALYSIS WITH CULTURE REFLEX
Bilirubin, UA: NEGATIVE
Glucose, UA: NEGATIVE mg/dL
Ketones UA: NEGATIVE mg/dL
Leukocyte Esterase, UA: NEGATIVE Leu/uL
Nitrite, UA: NEGATIVE
Protein, UR: NEGATIVE mg/dL
RBC, UA: 2 /hpf (ref 0–5)
Squam Epithel, UA: 3 /hpf — ABNORMAL HIGH (ref 0–2)
Urine Specific Gravity: 1.01 (ref 1.001–1.040)
Urobilinogen, UA: NORMAL mg/dL
WBC, UA: 2 /hpf (ref 0–4)
pH, Urine: 6.5 pH (ref 5.0–8.0)

## 2014-04-11 LAB — VH URINE DRUG SCREEN
Amphetamine: NEGATIVE
Barbiturates: NEGATIVE
Buprenorphine, Urine: NEGATIVE
Cannabinoids: NEGATIVE
Cocaine: NEGATIVE
Opiates: POSITIVE — AB
Phencyclidine: NEGATIVE
Urine Benzodiazepines: NEGATIVE
Urine Creatinine Random: 179.6 mg/dL
Urine Methadone Screen: NEGATIVE
Urine Oxycodone: NEGATIVE
Urine Specific Gravity: 1.018 (ref 1.001–1.040)
pH, Urine: 6.2 pH (ref 5.0–8.0)

## 2014-04-11 LAB — MAGNESIUM: Magnesium: 2 mg/dL (ref 1.6–2.6)

## 2014-04-11 LAB — LIPASE: Lipase: 505 U/L — ABNORMAL HIGH (ref 8–78)

## 2014-04-11 LAB — HEPATITIS PANEL, ACUTE
Hepatitis A IgM: NONREACTIVE
Hepatitis B Core IgM: NONREACTIVE
Hepatitis B Surface Antigen: NONREACTIVE
Hepatitis C Antibody: NONREACTIVE

## 2014-04-11 MED ORDER — AMLODIPINE BESYLATE 5 MG PO TABS
ORAL_TABLET | ORAL | Status: AC
Start: 2014-04-11 — End: ?
  Filled 2014-04-11: qty 1

## 2014-04-11 MED ORDER — POTASSIUM CHLORIDE 10 MEQ/100ML IV SOLN
10.0000 meq | INTRAVENOUS | Status: AC
Start: 2014-04-11 — End: 2014-04-11
  Administered 2014-04-11 (×4): 10 meq via INTRAVENOUS
  Filled 2014-04-11 (×2): qty 100

## 2014-04-11 MED ORDER — METOPROLOL TARTRATE 1 MG/ML IV SOLN
5.0000 mg | INTRAVENOUS | Status: AC
Start: 2014-04-11 — End: 2014-04-11
  Administered 2014-04-11: 5 mg via INTRAVENOUS

## 2014-04-11 MED ORDER — SODIUM CHLORIDE 0.9 % IV SOLN
INTRAVENOUS | Status: DC
Start: 2014-04-11 — End: 2014-04-11

## 2014-04-11 MED ORDER — ONDANSETRON HCL 4 MG/2ML IJ SOLN
4.0000 mg | Freq: Once | INTRAMUSCULAR | Status: AC
Start: 2014-04-11 — End: 2014-04-11
  Administered 2014-04-11: 4 mg via INTRAVENOUS

## 2014-04-11 MED ORDER — LACTATED RINGERS IV SOLN
INTRAVENOUS | Status: DC
Start: 2014-04-11 — End: 2014-04-13

## 2014-04-11 MED ORDER — POTASSIUM CHLORIDE 10 MEQ/100ML IV SOLN
INTRAVENOUS | Status: AC
Start: 2014-04-11 — End: ?
  Filled 2014-04-11: qty 100

## 2014-04-11 MED ORDER — PROMETHAZINE HCL 25 MG/ML IJ SOLN
12.5000 mg | INTRAMUSCULAR | Status: DC | PRN
Start: 2014-04-11 — End: 2014-04-14

## 2014-04-11 MED ORDER — HYDRALAZINE HCL 20 MG/ML IJ SOLN
10.0000 mg | Freq: Four times a day (QID) | INTRAMUSCULAR | Status: DC | PRN
Start: 2014-04-11 — End: 2014-04-14
  Administered 2014-04-11 – 2014-04-14 (×3): 10 mg via INTRAVENOUS
  Filled 2014-04-11 (×3): qty 1

## 2014-04-11 MED ORDER — DOCUSATE SODIUM 100 MG PO CAPS
100.0000 mg | ORAL_CAPSULE | Freq: Every day | ORAL | Status: DC
Start: 2014-04-12 — End: 2014-04-14
  Administered 2014-04-13 – 2014-04-14 (×2): 100 mg via ORAL
  Filled 2014-04-11 (×3): qty 1

## 2014-04-11 MED ORDER — SODIUM CHLORIDE 0.9 % IJ SOLN
40.0000 mg | Freq: Every day | INTRAVENOUS | Status: DC
Start: 2014-04-11 — End: 2014-04-13
  Administered 2014-04-11 – 2014-04-13 (×3): 40 mg via INTRAVENOUS
  Filled 2014-04-11 (×4): qty 40

## 2014-04-11 MED ORDER — POTASSIUM CHLORIDE CRYS ER 20 MEQ PO TBCR
60.0000 meq | EXTENDED_RELEASE_TABLET | Freq: Once | ORAL | Status: AC
Start: 2014-04-11 — End: 2014-04-11
  Administered 2014-04-11: 60 meq via ORAL
  Filled 2014-04-11 (×2): qty 3

## 2014-04-11 MED ORDER — VH HYDROMORPHONE HCL 1 MG/ML (NARRATOR)
INTRAMUSCULAR | Status: AC
Start: 2014-04-11 — End: ?
  Filled 2014-04-11: qty 1

## 2014-04-11 MED ORDER — LORAZEPAM 2 MG/ML IJ SOLN
2.0000 mg | INTRAMUSCULAR | Status: DC | PRN
Start: 2014-04-11 — End: 2014-04-14

## 2014-04-11 MED ORDER — ONDANSETRON HCL 4 MG/2ML IJ SOLN
INTRAMUSCULAR | Status: AC
Start: 2014-04-11 — End: ?
  Filled 2014-04-11: qty 2

## 2014-04-11 MED ORDER — ENOXAPARIN SODIUM 40 MG/0.4ML SC SOLN
40.0000 mg | Freq: Every day | SUBCUTANEOUS | Status: DC
Start: 2014-04-11 — End: 2014-04-14
  Administered 2014-04-13: 40 mg via SUBCUTANEOUS
  Filled 2014-04-11 (×5): qty 0.4

## 2014-04-11 MED ORDER — ONDANSETRON HCL 4 MG/2ML IJ SOLN
4.0000 mg | INTRAMUSCULAR | Status: DC | PRN
Start: 2014-04-11 — End: 2014-04-14
  Administered 2014-04-11 – 2014-04-12 (×4): 4 mg via INTRAVENOUS
  Filled 2014-04-11 (×4): qty 2

## 2014-04-11 MED ORDER — VH HYDROMORPHONE HCL PF 1 MG/ML CARPUJECT
1.0000 mg | INTRAMUSCULAR | Status: DC | PRN
Start: 2014-04-11 — End: 2014-04-13
  Administered 2014-04-11 – 2014-04-13 (×8): 1 mg via INTRAVENOUS
  Filled 2014-04-11 (×8): qty 1

## 2014-04-11 MED ORDER — AMLODIPINE BESYLATE 5 MG PO TABS
5.0000 mg | ORAL_TABLET | Freq: Every day | ORAL | Status: DC
Start: 2014-04-11 — End: 2014-04-14
  Administered 2014-04-11 – 2014-04-14 (×3): 5 mg via ORAL
  Filled 2014-04-11 (×3): qty 1

## 2014-04-11 MED ORDER — CLONIDINE HCL 0.2 MG/24HR TD PTWK
1.0000 | MEDICATED_PATCH | TRANSDERMAL | Status: DC
Start: 2014-04-11 — End: 2014-04-12
  Administered 2014-04-11: 1 via TRANSDERMAL
  Filled 2014-04-11: qty 1

## 2014-04-11 MED ORDER — AMLODIPINE BESYLATE 5 MG PO TABS
5.0000 mg | ORAL_TABLET | Freq: Every day | ORAL | Status: DC
Start: 2014-04-11 — End: 2014-04-11

## 2014-04-11 MED ORDER — SENNOSIDES-DOCUSATE SODIUM 8.6-50 MG PO TABS
2.0000 | ORAL_TABLET | Freq: Every evening | ORAL | Status: DC
Start: 2014-04-11 — End: 2014-04-14
  Administered 2014-04-12 – 2014-04-13 (×2): 2 via ORAL
  Filled 2014-04-11 (×4): qty 2

## 2014-04-11 MED ORDER — MORPHINE SULFATE (PF) 2 MG/ML IV SOLN
INTRAVENOUS | Status: AC
Start: 2014-04-11 — End: ?
  Filled 2014-04-11: qty 1

## 2014-04-11 MED ORDER — METOPROLOL TARTRATE 1 MG/ML IV SOLN
INTRAVENOUS | Status: AC
Start: 2014-04-11 — End: ?
  Filled 2014-04-11: qty 5

## 2014-04-11 MED ORDER — MORPHINE SULFATE 2 MG/ML IJ/IV SOLN (WRAP)
2.0000 mg | Freq: Once | Status: AC
Start: 2014-04-11 — End: 2014-04-11
  Administered 2014-04-11: 2 mg via INTRAVENOUS

## 2014-04-11 MED ORDER — BISACODYL 10 MG RE SUPP
10.0000 mg | Freq: Every day | RECTAL | Status: DC
Start: 2014-04-14 — End: 2014-04-14
  Filled 2014-04-11 (×2): qty 1

## 2014-04-11 NOTE — ED Notes (Signed)
Patient comes to the emergency department today because she is having abdominal pain.  She has a history of pancreatitis.  She states that she drank alcohol on 04/03/2014 and a couple of days later began to have abdominal pain.  Patient began to have vomiting yesterday which has been yellow bile.  Patient states her pain is diffuse across her abdomen.

## 2014-04-11 NOTE — ED Notes (Addendum)
Patient remains alert and oriented.  She states her pain has decreased from a 10/10 to a 6/10.  She continues to have epigastric abdominal pain, but is no longer vomiting.  Denies chest pain or shortness of breath.  Patient has been placed on the portable telemetry monitor.  She has her 2nd 100 mEq bag of potassium infusing into her left AC without difficulty.  She continues to remain hypertensive after receiving metoprolol.  She states that this is normal during her pancreatitis and Dr. Raquel James is aware.  Orders received for Norvasc 5mg  PO.  Norvasc given.

## 2014-04-11 NOTE — H&P (Signed)
CHIEF COMPLAINT: Abdominal pain of two days' duration.  Nausea  and vomiting of one day duration.    HISTORY OF PRESENT ILLNESS: The patient is a 54 year old  Caucasian woman with past medical history of alcohol abuse,  alcohol associated chronic pancreatitis, and recurrent  pancreatitis, hypertension, who has not been taking any  medication, Enterococcus bacteremia, and VRE, came to our  hospital due to abdominal pain of two days' duration and nausea  and vomiting of one day duration.  The patient has been sober  for two years, until this Christmas and New Year, the patient  started to drink vodka with coke followed by abdominal pain, as  if something stabbing her with a knife.It is 10/10 in intensity,  continuous, does not radiate to anywhere, but get better with  pain medications.  She also had nausea and vomiting 8 to 9 times  today.  The patient was on blood pressure medications while she  was in the hospital, but since her discharge in 2013, she has  never been on any blood pressure medications.    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  difficile colitis, history of DVT, status post IVC filter.    PAST SURGICAL HISTORY: Pancreatectomy in October 2012 at [facility].    FAMILY HISTORY: She does not know her family history, as she was  adopted.    SOCIAL HISTORY: The patient quit drinking for two years and  started during Christmas and New Year.  She denied any smoking  cigarette or drug abuse.    REVIEW OF SYSTEMS: All systems reviewed and are negative.    PHYSICAL EXAMINATION:  GENERAL:  The patient is lying on the bed without any  cardiopulmonary distress.  VITAL SIGNS:  Temperature is 98.4, pulse rate 62, respiratory  rate 14, blood pressure 222/120, oxygen saturation 99% at room  air.  HEENT:  Pink conjunctivae.  Anicteric sclerae.  RESPIRATORY:  No wheeze or  crackles.  CARDIOVASCULAR:  S1, S2 well heard.  No murmur or gallop.  ABDOMEN:  She has quite a few bowel sounds.  There is  tenderness, but not rebound tenderness or guarding.  GENITOURINARY:  No suprapubic tenderness.  MUSCULOSKELETAL:  No edema or deformity.  INTEGUMENTARY:  No rash or ecchymosis.  NERVOUS SYSTEM:  Conscious, alert, oriented in time, place, and  person.  No motor or sensory deficits.    LABORATORY INVESTIGATIONS: WBC 11, hemoglobin 16.5, hematocrit  48.3, platelet count 342.  Glucose is 126, BUN 9, creatinine  0.90.  Sodium 141, potassium is 3.4, chloride is 96, bicarbonate  28.8, calcium 10.1, AST is 59, ALT 64, alkaline phosphatase is  247.  Lipase is 505.    ASSESSMENT AND PLAN: The patient is a well-known patient to our  service with recurrent acute-on-chronic pancreatitis due to  alcohol abuse, has been sober for two years and started drinking  during Christmas and New Year Eve, came with abdominal pain and  her  lipase is 505.    1.  Acute alcohol-induced pancreatitis.  Admit her to      medical floor.  Start her on ringer lactate 200 cc/hour,      Dilaudid 1 mg IV three hourly p.r.n. for pain.  2.  Hypertensive urgency.  Labetalol drip until her blood      pressure is below 160/100, then clonidine patch 0.2 mg per 48  hours.  3.  Hypokalemia.  Potassium chloride.  Basic metabolic      profile every 4 hours until her potassium is greater than  3.5.  4.  Alcohol withdrawal symptoms.  CIWA.    5.Alcoholic hepatitis;Check hepatitis profile    6.  Deep venous thrombosis prophylaxis with Lovenox.        I hereby certify this patient for hospitalization based upon   medical necessity as noted above.    [id number]  DD: 04/11/2014 11:45:29  DT: 04/11/2014 13:24:44  JOB: [id number]

## 2014-04-11 NOTE — Plan of Care (Signed)
Problem: Pain  Goal: Patient's pain/discomfort is manageable  Outcome: Progressing  Pt able to verbalize understanding of pain scale and pain scale ratings.

## 2014-04-11 NOTE — Progress Notes (Signed)
Patient resting quietly in bed. LR infusing without complications. Will continue to monitor

## 2014-04-11 NOTE — Progress Notes (Signed)
Assumed care of patient. Call bell within reach. Tele intact. LR infusing at 200/hr.  bp 185/125 prn hydralazine given.  Patient C/O 7/10 abdominal pain and nausea, prn zofran and dilaudid given.  Critical K+ of 3.0 called by lab md aware and received TOV for PO once. K+ given.  Will continue to monitor

## 2014-04-11 NOTE — ED Provider Notes (Addendum)
Physician/Midlevel provider first contact with patient: 04/11/14 0930         [facility] EMERGENCY DEPARTMENT History and Physical Exam      Patient Name: [name], [name]  Encounter Date:  04/11/2014  Attending Physician: Joesphine Bare, MD  PCP: Christa See, MD  Patient DOB:  1960/04/27  MRN:  [id number]  Room:  N9/N9-A      History of Presenting Illness     Chief complaint: Abdominal Pain    HPI/ROS is limited by: none  HPI/ROS given by: patient    Location: Epigastric area  Duration: 0200  Severity: moderate    [name] is a 54 y.o. female who presents with abdominal pain. Along the epigastric area. She has history of pancreatitis in the past. She has had her gallbladder removed. Pancreatic tetanus in the past was attributed to an unknown cause. She notes that she does not have a history of heavy drinking although she does state "I think it's because I had a cocktail over Christmas" she denies any fevers or chills. She denies any history of any blackouts. She denies any trauma or falls. No history of any cough hemoptysis hematemesis. No history of any back pain flank pain shoulder pain. No chest pain or shortness of breath.      Review of Systems     Review of Systems   Constitutional: Positive for malaise/fatigue. Negative for fever, chills, weight loss and diaphoresis.   HENT: Negative for ear discharge, nosebleeds, sore throat and tinnitus.    Eyes: Negative.    Respiratory: Negative for cough, hemoptysis, sputum production and shortness of breath.    Cardiovascular: Negative for chest pain, palpitations, orthopnea and leg swelling.   Gastrointestinal: Positive for nausea, vomiting and abdominal pain. Negative for heartburn, diarrhea, blood in stool and melena.   Genitourinary: Negative for dysuria, urgency, frequency, hematuria and flank pain.   Musculoskeletal: Negative for myalgias, back pain, falls and neck pain.   Skin: Negative for itching and rash.   Neurological: Negative for  dizziness, tingling, sensory change, speech change, focal weakness, seizures and loss of consciousness.   Endo/Heme/Allergies: Does not bruise/bleed easily.   Psychiatric/Behavioral: Negative for depression, suicidal ideas, memory loss and substance abuse. The patient is not nervous/anxious.         Allergies     Pt is allergic to percocet.    Medications     Current Outpatient Rx   Name  Route  Sig  Dispense  Refill   . omeprazole (PRILOSEC) 20 MG capsule    Oral    Take 20 mg by mouth daily.             Marland Kitchen DISCONTD: celecoxib (CELEBREX) 200 MG capsule    Oral    Take 200 mg by mouth 2 (two) times daily.             Marland Kitchen DISCONTD: hydrochlorothiazide (HYDRODIURIL) 25 MG tablet    Oral    Take 25 mg by mouth daily.                Medications were reviewed by md  Past Medical History     Pt  Past Medical History   Diagnosis Date   . Hypertension    . Asthma without status asthmaticus      Past medical history was reviewed by md  Past Surgical History     Pt  Past Surgical History   Procedure Laterality Date   . Colonoscopy  12/26/2012  Procedure: COLONOSCOPY;  Surgeon: Gwenith Spitz, MD;  Location: [facility];  Service: Gastroenterology;  Laterality: N/A;   . Colonoscopy, polypectomy  12/26/2012     Procedure: COLONOSCOPY, POLYPECTOMY;  Surgeon: Gwenith Spitz, MD;  Location: [facility];  Service: Gastroenterology;  Laterality: N/A;   . Cholecystectomy       Past surgical history was reviewed by md  Family History   History reviewed. No pertinent family history.  The family history was reviewed by md  Social History     Pt  History     Social History   . Marital Status: Single     Spouse Name: N/A     Number of Children: N/A   . Years of Education: N/A     Social History Main Topics   . Smoking status: Current Every Day Smoker -- 0.50 packs/day     Types: Cigarettes   . Smokeless tobacco: Never Used   . Alcohol Use: Yes      Comment: occasional   . Drug Use: No   . Sexual Activity: None     Other Topics  Concern   . None     Social History Narrative     Social history reviewed by md  Physical Exam     Blood pressure 215/109, pulse 65, temperature 98.4 F (36.9 C), resp. rate 14, height 1.676 m, weight 81 kg, SpO2 98 %.    Physical Exam   Constitutional: She is oriented to person, place, and time and well-developed, well-nourished, and in no distress. No distress.   She sitting upright. She appears older than her stated age. She makes good eye contact is interactive   HENT:   Head: Normocephalic and atraumatic.   Right Ear: External ear normal.   Left Ear: External ear normal.   Nose: Nose normal.   Mouth/Throat: Oropharynx is clear and moist.   Eyes: Conjunctivae and EOM are normal. Pupils are equal, round, and reactive to light.   Neck: Normal range of motion. Neck supple. No JVD present. No tracheal deviation present. No thyromegaly present.   Cardiovascular: Normal rate, regular rhythm, normal heart sounds and intact distal pulses.  Exam reveals no gallop and no friction rub.    No murmur heard.  Good peripheral pulses. Good cap refill   Pulmonary/Chest: Effort normal and breath sounds normal. No stridor. No respiratory distress. She has no wheezes. She has no rales. She exhibits no tenderness.   Abdominal: Soft. Bowel sounds are normal. She exhibits no distension and no mass. There is tenderness. There is no rebound and no guarding.   Patient tender along the epigastric area no rebound or guarding no masses   Musculoskeletal: Normal range of motion. She exhibits no edema or tenderness.   Lymphadenopathy:     She has no cervical adenopathy.   Neurological: She is alert and oriented to person, place, and time. She has normal reflexes. No cranial nerve deficit. She exhibits normal muscle tone. Gait normal. Coordination normal. GCS score is 15.   Skin: Skin is warm and dry. No rash noted. She is not diaphoretic. No erythema. No pallor.   Psychiatric: Mood, memory, affect and judgment normal.   Nursing note and  vitals reviewed.       Orders Placed     Orders Placed This Encounter   Procedures   . CBC and differential   . Comprehensive metabolic panel   . Lipase   . Urinalysis with Culture if Indicated   .  Contact isolation status   . Insert peripheral IV   . [facility] ED Bed Request       Diagnostic Results       The results of the diagnostic studies below have been reviewed by myself:    Labs  Results     Procedure Component Value Units Date/Time    Comprehensive metabolic panel [[id number]]  (Abnormal) Collected:  04/11/14 0942    Specimen Information:  Plasma Updated:  04/11/14 1035     Sodium 141 mMol/L      Potassium 2.4 (LL) mMol/L      Chloride 96 (L) mMol/L      CO2 28.8 mMol/L      CALCIUM 10.1 mg/dL      Glucose 409 (H) mg/dL      Creatinine 8.11 mg/dL      BUN 9 mg/dL      Protein, Total 8.1 gm/dL      Albumin 4.5 gm/dL      Alkaline Phosphatase 247 (H) U/L      ALT 64 (H) U/L      AST (SGOT) 59 (H) U/L      Bilirubin, Total 1.8 (H) mg/dL      Albumin/Globulin Ratio 1.25 Ratio      Anion Gap 18.6 (H) mMol/L      BUN/Creatinine Ratio 10.0 Ratio      EGFR >60 mL/min/1.53m2      Osmolality Calc 281 mOsm/kg      Globulin 3.6 gm/dL     Lipase [[id number]]  (Abnormal) Collected:  04/11/14 0942    Specimen Information:  Plasma Updated:  04/11/14 1022     Lipase 505 (H) U/L     CBC and differential [[id number]]  (Abnormal) Collected:  04/11/14 0942    Specimen Information:  Blood / Blood Updated:  04/11/14 1014     WBC 11.0 K/cmm      RBC 4.37 M/cmm      Hemoglobin 16.5 (H) gm/dL      Hematocrit 46.9 (H) %      MCV 111 (H) fL      MCH 38 (H) pg      MCHC 34 gm/dL      RDW 62.9 %      PLT CT 342 K/cmm      MPV 6.6 fL      NEUTROPHIL % 76.8 %      Lymphocytes 16.2 %      Monocytes 5.6 %      Eosinophils % 0.6 %      Basophils % 0.8 %      Neutrophils Absolute 8.4 K/cmm      Lymphocytes Absolute 1.8 K/cmm      Monocytes Absolute 0.6 K/cmm      Eosinophils Absolute 0.1 K/cmm      BASO Absolute 0.1 K/cmm      RBC Morphology RBC  Morphology Reviewed      Macrocytic 3+      Target Cells 2+           Radiologic Studies  Radiology Results (24 Hour)     ** No results found for the last 24 hours. **          EKG: none      MDM / Critical Care     Blood pressure 215/109, pulse 65, temperature 98.4 F (36.9 C), resp. rate 14, height 1.676 m, weight 81 kg, SpO2 98 %.    The patient presents to the Emergency Department with  abdominal pain. Treatment has been initiated in the ER, but the patient has not had significant improvement in symptoms, appears ill enough and/or has illness/findings/co-morbidities that make admission for IV medications, possible surgical consult, and further management the most appropriate disposition. Differential diagnosis has included but is not limited to appendicitis, gall bladder disease, bowel obstruction, colitis, gastroenteritis, urinary tract obstruction, AAA, pancreatitic and hepatitis.  Diagnostic impression and plan were discussed and agreed upon with the patient and/or family.  Results of lab/radiology tests were reviewed and discussed with the patient and/or family. All questions were answered and concerns addressed.  Appropriate consultation was made for admission and further treatment of this patient.              Procedures     CRITICAL CARE   By Joesphine Bare, MD  11:33 AM    Total Time: 30-74 minutes    Critical care services are exclusive of time spent performing procedures. Patient is a full code. She is placed on potassium drip at 120 and hour with 40 mEq in the liter of normal saline. Given IV pain medication and 5 of Lopressor IV as well as 5 of Norvasc. The time does include time spent directly with [name], documenting, reviewing labs and radiographs, and speaking with medical staff.              Diagnosis / Disposition     Clinical Impression  1. Acute pancreatitis, unspecified    2. Hypertensive urgency  3. Hypokalemia  4. CRITICAL CARE TIME  Disposition  ED Disposition      Admit Bed Type: General [8]  Admitting Physician: Marlou Sa [[id number]]  Patient Class: Inpatient [101]            Prescriptions  New Prescriptions    No medications on file                   Joesphine Bare, MD  04/11/14 1115    Joesphine Bare, MD  04/11/14 1135

## 2014-04-12 LAB — BASIC METABOLIC PANEL
Anion Gap: 14.6 mMol/L (ref 7.0–18.0)
Anion Gap: 13 mMol/L (ref 7.0–18.0)
Anion Gap: 11.5 mMol/L (ref 7.0–18.0)
Anion Gap: 15.7 mMol/L (ref 7.0–18.0)
Anion Gap: 13.9 mMol/L (ref 7.0–18.0)
Anion Gap: 11.5 mMol/L (ref 7.0–18.0)
Anion Gap: 12.3 mMol/L (ref 7.0–18.0)
BUN / Creatinine Ratio: 13.3 Ratio (ref 10.0–30.0)
BUN / Creatinine Ratio: 13 Ratio (ref 10.0–30.0)
BUN / Creatinine Ratio: 11.1 Ratio (ref 10.0–30.0)
BUN / Creatinine Ratio: 12 Ratio (ref 10.0–30.0)
BUN / Creatinine Ratio: 10.6 Ratio (ref 10.0–30.0)
BUN / Creatinine Ratio: 10.9 Ratio (ref 10.0–30.0)
BUN / Creatinine Ratio: 10.4 Ratio (ref 10.0–30.0)
BUN: 10 mg/dL (ref 7–22)
BUN: 10 mg/dL (ref 7–22)
BUN: 11 mg/dL (ref 7–22)
BUN: 12 mg/dL (ref 7–22)
BUN: 11 mg/dL (ref 7–22)
BUN: 12 mg/dL (ref 7–22)
BUN: 11 mg/dL (ref 7–22)
CO2: 26.3 mMol/L (ref 20.0–30.0)
CO2: 25.4 mMol/L (ref 20.0–30.0)
CO2: 24 mMol/L (ref 20.0–30.0)
CO2: 26 mMol/L (ref 20.0–30.0)
CO2: 21.6 mMol/L (ref 20.0–30.0)
CO2: 24.8 mMol/L (ref 20.0–30.0)
CO2: 25.1 mMol/L (ref 20.0–30.0)
Calcium: 9.2 mg/dL (ref 8.5–10.5)
Calcium: 9.1 mg/dL (ref 8.5–10.5)
Calcium: 8.7 mg/dL (ref 8.5–10.5)
Calcium: 9.1 mg/dL (ref 8.5–10.5)
Calcium: 8.8 mg/dL (ref 8.5–10.5)
Calcium: 8.7 mg/dL (ref 8.5–10.5)
Calcium: 8.4 mg/dL — ABNORMAL LOW (ref 8.5–10.5)
Chloride: 102 mMol/L (ref 98–110)
Chloride: 103 mMol/L (ref 98–110)
Chloride: 101 mMol/L (ref 98–110)
Chloride: 103 mMol/L (ref 98–110)
Chloride: 102 mMol/L (ref 98–110)
Chloride: 101 mMol/L (ref 98–110)
Chloride: 101 mMol/L (ref 98–110)
Creatinine: 0.75 mg/dL (ref 0.60–1.20)
Creatinine: 0.77 mg/dL (ref 0.60–1.20)
Creatinine: 0.99 mg/dL (ref 0.60–1.20)
Creatinine: 1 mg/dL (ref 0.60–1.20)
Creatinine: 1.04 mg/dL (ref 0.60–1.20)
Creatinine: 1.1 mg/dL (ref 0.60–1.20)
Creatinine: 1.06 mg/dL (ref 0.60–1.20)
EGFR: >60 mL/min/{1.73_m2}
EGFR: >60 mL/min/{1.73_m2}
EGFR: 58 mL/min/{1.73_m2}
EGFR: 59 mL/min/{1.73_m2}
EGFR: 55 mL/min/{1.73_m2}
EGFR: 52 mL/min/{1.73_m2}
EGFR: 54 mL/min/{1.73_m2}
Glucose: 97 mg/dL (ref 70–99)
Glucose: 100 mg/dL — ABNORMAL HIGH (ref 70–99)
Glucose: 100 mg/dL — ABNORMAL HIGH (ref 70–99)
Glucose: 120 mg/dL — ABNORMAL HIGH (ref 70–99)
Glucose: 100 mg/dL — ABNORMAL HIGH (ref 70–99)
Glucose: 113 mg/dL — ABNORMAL HIGH (ref 70–99)
Glucose: 108 mg/dL — ABNORMAL HIGH (ref 70–99)
Osmolality Calc: 278 mOsm/kg (ref 275–300)
Osmolality Calc: 275 mOsm/kg (ref 275–300)
Osmolality Calc: 273 mOsm/kg — ABNORMAL LOW (ref 275–300)
Osmolality Calc: 275 mOsm/kg (ref 275–300)
Osmolality Calc: 268 mOsm/kg — ABNORMAL LOW (ref 275–300)
Osmolality Calc: 269 mOsm/kg — ABNORMAL LOW (ref 275–300)
Osmolality Calc: 270 mOsm/kg — ABNORMAL LOW (ref 275–300)
Potassium: 2.9 mMol/L — CL (ref 3.5–5.3)
Potassium: 3.4 mMol/L — ABNORMAL LOW (ref 3.5–5.3)
Potassium: 3.5 mMol/L (ref 3.5–5.3)
Potassium: 3.7 mMol/L (ref 3.5–5.3)
Potassium: 3.5 mMol/L (ref 3.5–5.3)
Potassium: 3.3 mMol/L — ABNORMAL LOW (ref 3.5–5.3)
Potassium: 3.4 mMol/L — ABNORMAL LOW (ref 3.5–5.3)
Sodium: 140 mMol/L (ref 136–147)
Sodium: 138 mMol/L (ref 136–147)
Sodium: 137 mMol/L (ref 136–147)
Sodium: 137 mMol/L (ref 136–147)
Sodium: 134 mMol/L — ABNORMAL LOW (ref 136–147)
Sodium: 134 mMol/L — ABNORMAL LOW (ref 136–147)
Sodium: 135 mMol/L — ABNORMAL LOW (ref 136–147)

## 2014-04-12 LAB — MAGNESIUM: Magnesium: 1.8 mg/dL (ref 1.6–2.6)

## 2014-04-12 MED ORDER — POTASSIUM CHLORIDE CRYS ER 20 MEQ PO TBCR
40.0000 meq | EXTENDED_RELEASE_TABLET | Freq: Once | ORAL | Status: AC
Start: 2014-04-12 — End: 2014-04-12
  Administered 2014-04-12: 40 meq via ORAL
  Filled 2014-04-12 (×2): qty 2

## 2014-04-12 MED ORDER — LABETALOL HCL 200 MG PO TABS
100.0000 mg | ORAL_TABLET | Freq: Two times a day (BID) | ORAL | Status: DC
Start: 2014-04-12 — End: 2014-04-14
  Administered 2014-04-12 – 2014-04-14 (×4): 100 mg via ORAL
  Filled 2014-04-12 (×6): qty 1

## 2014-04-12 MED ORDER — IBUPROFEN 200 MG PO TABS
200.0000 mg | ORAL_TABLET | Freq: Once | ORAL | Status: AC
Start: 2014-04-12 — End: 2014-04-12
  Administered 2014-04-12: 200 mg via ORAL
  Filled 2014-04-12: qty 1

## 2014-04-12 NOTE — UM Notes (Signed)
[facility] Utilization Management Review Sheet    NAME: [name]  MR#: [id number]    CSN#: [id number]    ROOM: 363/363-A AGE: 54 y.o.    ADMIT DATE AND TIME: 04/11/2014  9:22 AM MD Order 04/11/2014 @ 1204      PATIENT CLASS:inpatient    ATTENDING PHYSICIAN: Hailesilasie, Dereje D, *  PAYOR:Payor: /       AUTH #:     DIAGNOSIS:     ICD-10-CM    1. Acute pancreatitis, unspecified K85.9        HISTORY:   Past Medical History   Diagnosis Date   . Hypertension    . Asthma without status asthmaticus    . Gastroesophageal reflux disease    . Pancreatitis    . DVT (deep venous thrombosis)    . S/P IVC filter    . Clostridium difficile colitis    . H/O ETOH abuse      sober 2 years until 03/2014       DATE OF REVIEW: 04/12/2014    VITALS: BP 133/89 mmHg  Pulse 87  Temp(Src) 98 F (36.7 C) (Oral)  Resp 18  Ht 1.676 m (5' 5.98")  Wt 81.557 kg (179 lb 12.8 oz)  BMI 29.03 kg/m2  SpO2 95%    ED Treatment    Presentation:  presents with abdominal pain. Along the epigastric area. She has history of pancreatitis in the past. She has had her gallbladder removed. Pancreatic tetanus in the past was attributed to an unknown cause. She notes that she does not have a history of heavy drinking although she does state "I think it's because I had a cocktail over Christmas" she denies any fevers or chills Blood pressure 215/109, pulse 65, temperature 98.4 F (36.9 C), resp. rate 14, height 1.676 m, weight 81 kg, SpO2 98 %.  Labs:  K 2.4, Chl 96, Glu 126, Alk Phos 247, ALT 64, AST 59, Total Bili 1.8, Anion Gap 18.6, Lipase 505, H&H 16.5/48.3,   Meds:  Zofran, MSO4, K rider x 3, Norvasc, Lopressor, Dilaudid,   Medical Admission Review  Medical   H&P:  history of alcohol abuse, alcohol associated chronic pancreatitis, and recurrent pancreatitis, hypertension, who has not been taking any medication, Enterococcus bacteremia, and VRE, came to our hospital due to abdominal pain of two days' duration and nausea and vomiting of one day  duration. The patient has been sober for two years, until this Christmas and New Year, the patient started to drink vodka with coke followed by abdominal pain  nausea and vomiting 8 to 9 times today. The patient was on blood pressure medications while she was in the hospital, but since her discharge in 2013, she has never been on any blood pressure medications  Labs:  See above from ER   Assessment/ Plan:  1. Acute alcohol-induced pancreatitis. Admit her to medical floor. Start her on ringer lactate 200 cc/hour, Dilaudid 1 mg IV three hourly p.r.n. for pain.  2. Hypertensive urgency. Labetalol drip until her blood pressure is below 160/100, then clonidine patch 0.2 mg per 48  hours.  3. Hypokalemia. Potassium chloride. Basic metabolic profile every 4 hours until her potassium is greater than  3.5.  4. Alcohol withdrawal symptoms. CIWA.    Antonietta Jewel R.N.  Utilization Management  [facility]  [address]  [place], [place] [address]  Phone [phone]   Fax [phone]

## 2014-04-12 NOTE — Progress Notes (Signed)
Received report from nightshift nurse. Assumed care of Pt. Whiteboard updated. Tele intact, IV intact. Pt denies pain at this time and is stable.  Patient resting in bed. Call bell in reach, bed in lowest position. Will continue to monitor.

## 2014-04-12 NOTE — Progress Notes (Signed)
K+ came back critical at 2.9, MD notified and 40 mEq K PO ordered. 40 given.  BMP to be rechecked around 0300

## 2014-04-12 NOTE — Progress Notes (Signed)
[facility]  PROGRESS NOTE      Patient: [name]  Date: 04/12/2014   LOS: 1 Days  Admission Date: 04/11/2014   MRN: [id number]  Attending: Karren Cobble, MD       SUBJECTIVE   SUBJECTIVE: No abdominal pain,nausea or vomiting.    MEDICATIONS     Current Facility-Administered Medications   Medication Dose Route Frequency   . amLODIPine  5 mg Oral Daily   . [START ON 04/14/2014] bisacodyl  10 mg Rectal Daily at 0600   . cloNIDine  1 patch Transdermal Weekly   . docusate sodium  100 mg Oral Daily   . enoxaparin  40 mg Subcutaneous Daily   . pantoprazole  40 mg Intravenous Daily   . senna-docusate  2 tablet Oral QHS     . lactated ringers 200 mL/hr at 04/12/14 0627       ROS   All systems were  reviewed and are negative except those mentioned in the subjective assessment.    PHYSICAL EXAM     Objective:    Intake/Output Summary (Last 24 hours) at 04/12/14 1212  Last data filed at 04/12/14 0505   Gross per 24 hour   Intake    480 ml   Output    200 ml   Net    280 ml     Patient Vitals for the past 24 hrs:   BP Temp Temp src Pulse Resp SpO2 Weight   04/12/14 1155 112/76 mmHg 98.1 F (36.7 C) Oral 99 20 93 % -   04/12/14 0722 96/62 mmHg 99.4 F (37.4 C) Oral 96 18 92 % -   04/12/14 0505 (!) 141/94 mmHg 98 F (36.7 C) - - 16 94 % 81.557 kg (179 lb 12.8 oz)   04/11/14 2258 (!) 158/102 mmHg 97.9 F (36.6 C) Oral 92 17 93 % -   04/11/14 2035 133/76 mmHg - - 83 - 92 % -   04/11/14 2002 (!) 185/122 mmHg 97.5 F (36.4 C) Oral 73 16 92 % -   04/11/14 1342 (!) 185/126 mmHg 97.7 F (36.5 C) Oral 60 16 94 % -   04/11/14 1253 (!) 188/115 mmHg 97.9 F (36.6 C) Oral (!) 55 13 94 % -       Exam:   General: Patient is awake, alert and  in no  distress  Eyes - pupils equal and reactive, non icteric sclera  HENT: supple, without JVD  Chest:  No crackles or wheezing.  CVS: S1, S2 well heard,  no murmurs, or gallops.    Abdomen: soft and non tender with normal bowel sounds.  Genitourinary tract : No suprapubic  or CVA tenderness.  Musculoskeletal: No edema or deformity.   Skin : No rash or echymosis  NEURO:  No Focal motor or sensory  deficits        LABS         Recent Labs  Lab 04/11/14  2318 04/11/14  0942   WBC 11.9* 11.0   RBC 3.99 4.37   HEMOGLOBIN 15.3 16.5*   HEMATOCRIT 45.1 48.3*   MCV 113* 111*   PLATELETS 310 342       Recent Labs  Lab 04/12/14  1016 04/12/14  0935 04/12/14  0536 04/12/14  0532 04/11/14  2307 04/11/14  1831 04/11/14  1426   SODIUM 137 137  --  138 140 140 140   POTASSIUM 3.5 3.7  --  3.4* 2.9* 3.0* 2.7*  CHLORIDE 103 101  --  103 102 99 100   CO2 26.0 24.0  --  25.4 26.3 28.9 26.8   BUN 12 11  --  10 10 10 10    CREATININE 1.00 0.99  --  0.77 0.75 0.78 0.80   GLUCOSE 120* 100*  --  100* 97 101* 121*   CALCIUM 8.7 9.1  --  9.1 9.2 8.8 9.4   MAGNESIUM  --   --  1.8  --   --   --  2.0     No results for input(s): OSMOLUR, NAUR in the last 168 hours.    Invalid input(s): OSMOL    Recent Labs  Lab 04/11/14  1426 04/11/14  0942   ALT 53 64*   AST (SGOT) 45* 59*   BILIRUBIN, TOTAL 1.4* 1.8*   ALBUMIN 3.9 4.5   ALKALINE PHOSPHATASE 219* 247*     No results for input(s): CK, CKMB, CKMBINDEX, TROPI in the last 168 hours.  No results for input(s): INR, PT, PTT in the last 168 hours.     Invalid input(s): [facility]  Microbiology Results     Procedure Component Value Units Date/Time    VRE Culture [[id number]] Collected:  04/11/14 1415    Specimen Information:  Perirectal / Perirectal Updated:  04/12/14 0904          RADIOLOGY     No orders to display       Assessment and Plan:   --Acute alcohol-induced pancreatitis ;Started diet,ringer lactate at 100 cc/hour    --Hypertension;labetalol,amlodipin    --hypokalemia; resolved    --Alcoholic hepatitis; advised her not to drink alcohol    DVT prophylaxis: Lovenox    DISPOSITION; If she tolerate diet she will be discharged tomorrow.    Karren Cobble, MD  04/12/2014 12:12 PM

## 2014-04-12 NOTE — Progress Notes (Signed)
Patient C/O headache. MD notified and received   TOV for 200mg  motrin once.

## 2014-04-13 ENCOUNTER — Inpatient Hospital Stay: Payer: Commercial Managed Care - POS

## 2014-04-13 LAB — BASIC METABOLIC PANEL
Anion Gap: 10.6 mMol/L (ref 7.0–18.0)
Anion Gap: 11.7 mMol/L (ref 7.0–18.0)
Anion Gap: 13.1 mMol/L (ref 7.0–18.0)
Anion Gap: 11.5 mMol/L (ref 7.0–18.0)
BUN / Creatinine Ratio: 12 Ratio (ref 10.0–30.0)
BUN / Creatinine Ratio: 12.8 Ratio (ref 10.0–30.0)
BUN / Creatinine Ratio: 11.1 Ratio (ref 10.0–30.0)
BUN / Creatinine Ratio: 13 Ratio (ref 10.0–30.0)
BUN: 11 mg/dL (ref 7–22)
BUN: 11 mg/dL (ref 7–22)
BUN: 9 mg/dL (ref 7–22)
BUN: 10 mg/dL (ref 7–22)
CO2: 25.8 mMol/L (ref 20.0–30.0)
CO2: 24.9 mMol/L (ref 20.0–30.0)
CO2: 23 mMol/L (ref 20.0–30.0)
CO2: 23.2 mMol/L (ref 20.0–30.0)
Calcium: 8.6 mg/dL (ref 8.5–10.5)
Calcium: 8.8 mg/dL (ref 8.5–10.5)
Calcium: 8.7 mg/dL (ref 8.5–10.5)
Calcium: 8.6 mg/dL (ref 8.5–10.5)
Chloride: 104 mMol/L (ref 98–110)
Chloride: 104 mMol/L (ref 98–110)
Chloride: 103 mMol/L (ref 98–110)
Chloride: 105 mMol/L (ref 98–110)
Creatinine: 0.92 mg/dL (ref 0.60–1.20)
Creatinine: 0.86 mg/dL (ref 0.60–1.20)
Creatinine: 0.81 mg/dL (ref 0.60–1.20)
Creatinine: 0.77 mg/dL (ref 0.60–1.20)
EGFR: >60 mL/min/{1.73_m2}
EGFR: >60 mL/min/{1.73_m2}
EGFR: >60 mL/min/{1.73_m2}
EGFR: >60 mL/min/{1.73_m2}
Glucose: 107 mg/dL — ABNORMAL HIGH (ref 70–99)
Glucose: 134 mg/dL — ABNORMAL HIGH (ref 70–99)
Glucose: 111 mg/dL — ABNORMAL HIGH (ref 70–99)
Glucose: 124 mg/dL — ABNORMAL HIGH (ref 70–99)
Osmolality Calc: 274 mOsm/kg — ABNORMAL LOW (ref 275–300)
Osmolality Calc: 275 mOsm/kg (ref 275–300)
Osmolality Calc: 269 mOsm/kg — ABNORMAL LOW (ref 275–300)
Osmolality Calc: 272 mOsm/kg — ABNORMAL LOW (ref 275–300)
Potassium: 3.4 mMol/L — ABNORMAL LOW (ref 3.5–5.3)
Potassium: 3.6 mMol/L (ref 3.5–5.3)
Potassium: 4.1 mMol/L (ref 3.5–5.3)
Potassium: 3.7 mMol/L (ref 3.5–5.3)
Sodium: 137 mMol/L (ref 136–147)
Sodium: 137 mMol/L (ref 136–147)
Sodium: 135 mMol/L — ABNORMAL LOW (ref 136–147)
Sodium: 136 mMol/L (ref 136–147)

## 2014-04-13 LAB — CBC AND DIFFERENTIAL
Basophils %: 0.7 % (ref 0.0–3.0)
Basophils Absolute: 0.1 10*3/uL (ref 0.0–0.3)
Eosinophils %: 2 % (ref 0.0–7.0)
Eosinophils Absolute: 0.2 10*3/uL (ref 0.0–0.8)
Hematocrit: 34.4 % — ABNORMAL LOW (ref 36.0–48.0)
Hemoglobin: 11.6 gm/dL — ABNORMAL LOW (ref 12.0–16.0)
Lymphocytes Absolute: 2.3 10*3/uL (ref 0.6–5.1)
Lymphocytes: 27.4 % (ref 15.0–46.0)
MCH: 39 pg — ABNORMAL HIGH (ref 28–35)
MCHC: 34 gm/dL (ref 32–36)
MCV: 115 fL — ABNORMAL HIGH (ref 80–100)
MPV: 6.9 fL (ref 6.0–10.0)
Monocytes Absolute: 0.7 10*3/uL (ref 0.1–1.7)
Monocytes: 8.1 % (ref 3.0–15.0)
Neutrophils %: 61.8 % (ref 42.0–78.0)
Neutrophils Absolute: 5.2 10*3/uL (ref 1.7–8.6)
PLT CT: 212 10*3/uL (ref 130–440)
RBC: 3 10*6/uL — ABNORMAL LOW (ref 3.80–5.00)
RDW: 13.6 % (ref 11.0–14.0)
WBC: 8.5 10*3/uL (ref 4.0–11.0)

## 2014-04-13 LAB — COMPREHENSIVE METABOLIC PANEL
ALT: 38 U/L (ref 0–55)
AST (SGOT): 58 U/L — ABNORMAL HIGH (ref 10–42)
Albumin/Globulin Ratio: 1.12 Ratio (ref 0.70–1.50)
Albumin: 2.9 gm/dL — ABNORMAL LOW (ref 3.5–5.0)
Alkaline Phosphatase: 168 U/L — ABNORMAL HIGH (ref 40–145)
Anion Gap: 11.1 mMol/L (ref 7.0–18.0)
BUN / Creatinine Ratio: 11.8 Ratio (ref 10.0–30.0)
BUN: 12 mg/dL (ref 7–22)
Bilirubin, Total: 1.6 mg/dL — ABNORMAL HIGH (ref 0.1–1.2)
CO2: 25.3 mMol/L (ref 20.0–30.0)
Calcium: 8.5 mg/dL (ref 8.5–10.5)
Chloride: 103 mMol/L (ref 98–110)
Creatinine: 1.02 mg/dL (ref 0.60–1.20)
EGFR: 57 mL/min/{1.73_m2}
Globulin: 2.6 gm/dL (ref 2.0–4.0)
Glucose: 102 mg/dL — ABNORMAL HIGH (ref 70–99)
Osmolality Calc: 272 mOsm/kg — ABNORMAL LOW (ref 275–300)
Potassium: 3.4 mMol/L — ABNORMAL LOW (ref 3.5–5.3)
Protein, Total: 5.5 gm/dL — ABNORMAL LOW (ref 6.0–8.3)
Sodium: 136 mMol/L (ref 136–147)

## 2014-04-13 LAB — LIPASE
Lipase: 156 U/L — ABNORMAL HIGH (ref 8–78)
Lipase: 138 U/L — ABNORMAL HIGH (ref 8–78)

## 2014-04-13 MED ORDER — POTASSIUM CHLORIDE CRYS ER 20 MEQ PO TBCR
60.0000 meq | EXTENDED_RELEASE_TABLET | Freq: Once | ORAL | Status: AC
Start: 2014-04-13 — End: 2014-04-13
  Administered 2014-04-13: 60 meq via ORAL
  Filled 2014-04-13: qty 3

## 2014-04-13 MED ORDER — LEVOFLOXACIN IN D5W 750 MG/150ML IV SOLN
750.0000 mg | Freq: Every day | INTRAVENOUS | Status: DC
Start: 2014-04-13 — End: 2014-04-14
  Administered 2014-04-13 – 2014-04-14 (×2): 750 mg via INTRAVENOUS
  Filled 2014-04-13 (×2): qty 150

## 2014-04-13 MED ORDER — HYDROMORPHONE HCL 2 MG PO TABS
2.0000 mg | ORAL_TABLET | Freq: Four times a day (QID) | ORAL | Status: DC | PRN
Start: 2014-04-13 — End: 2014-04-14
  Administered 2014-04-13 – 2014-04-14 (×4): 2 mg via ORAL
  Filled 2014-04-13 (×4): qty 1

## 2014-04-13 MED ORDER — PANTOPRAZOLE SODIUM 40 MG PO TBEC
40.0000 mg | DELAYED_RELEASE_TABLET | Freq: Every morning | ORAL | Status: DC
Start: 2014-04-14 — End: 2014-04-14
  Administered 2014-04-14: 40 mg via ORAL
  Filled 2014-04-13: qty 1

## 2014-04-13 MED ORDER — IBUPROFEN 400 MG PO TABS
400.0000 mg | ORAL_TABLET | Freq: Four times a day (QID) | ORAL | Status: DC | PRN
Start: 2014-04-13 — End: 2014-04-13
  Administered 2014-04-13: 400 mg via ORAL
  Filled 2014-04-13: qty 1

## 2014-04-13 NOTE — Progress Notes (Signed)
Doctor in, IV patient relief noted from pain med.

## 2014-04-13 NOTE — PT Eval Note (Signed)
[facility]  [facility]                                                         [address]  [place] [address]  Department of Rehabilitation Services  [phone]  [name]    CSN: [id number]    MED TELEMETRY STEP-DOWN   363/363-A    Physical Therapy Evaluation    Time of treatment: Time Calculation  PT Received On: 04/13/14  Start Time: 0910  Stop Time: 0924  Time Calculation (min): 14 min    PT Visit Number: 1 (Performed as a Co-Eval with OT)                                                                                 Precautions and Contraindications:   Precautions  Weight Bearing Status: no restrictions  Other Precautions: Falls    Assessment:      [name] is a 54 y.o. female admitted 04/11/2014 with past medical history of alcohol abuse,  alcohol associated chronic pancreatitis, and recurrent  pancreatitis, hypertension, who has not been taking any  medication, Enterococcus bacteremia, and VRE, came to our  hospital due to abdominal pain of two days' duration and nausea  and vomiting of one day duration.  The patient has been sober  for two years, until this Christmas and New Year, the patient  started to drink vodka with coke followed by abdominal pain, as  if something stabbing her with a knife.It is 10/10 in intensity,  continuous, does not radiate to anywhere, but get better with  pain medications.  She also had nausea and vomiting 8 to 9 times  today.  The patient was on blood pressure medications while she  was in the hospital, but since her discharge in 2013, she has  never been on any blood pressure medications.  . PT Impairments: Appears to be at baseline for balance;Appears to be at baseline for mobility.  Progress: Discontinue PT    Patient appears to be at her baseline for all functional mobility and would not benefit from additional skilled acute care PT services at this  time      Rehabilitation Potential: Prognosis: Poor (Pt appears to be at her baseline for all functional mobility)              Plan:     Treatment/Interventions: No skilled interventions needed at this time           DISCHARGE RECOMMENDATIONS        Discharge Recommendation: Home with no needs    History of Present Illness      Medical Diagnosis: Acute pancreatitis, unspecified [K85.9]    [name] is a 54 y.o. female admitted on 04/11/2014 with a past medical history of alcohol abuse,  alcohol associated chronic pancreatitis, and recurrent  pancreatitis, hypertension, who has not been taking any  medication, Enterococcus bacteremia, and VRE, came to our  hospital due to abdominal pain of  two days' duration and nausea  and vomiting of one day duration.  The patient has been sober  for two years, until this Christmas and New Year, the patient  started to drink vodka with coke followed by abdominal pain, as  if something stabbing her with a knife.It is 10/10 in intensity,  continuous, does not radiate to anywhere, but get better with  pain medications.  She also had nausea and vomiting 8 to 9 times  today.  The patient was on blood pressure medications while she  was in the hospital, but since her discharge in 2013, she has  never been on any blood pressure medications.    Patient Active Problem List   Diagnosis   . Alcohol induced acute pancreatitis   . Hypertensive urgency   . Hypokalemia   . Acute alcoholic pancreatitis      Past Medical/Surgical History:  Past Medical History   Diagnosis Date   . Hypertension    . Asthma without status asthmaticus    . Gastroesophageal reflux disease    . Pancreatitis    . DVT (deep venous thrombosis)    . S/P IVC filter    . Clostridium difficile colitis    . H/O ETOH abuse      sober 2 years until 03/2014      Past Surgical History   Procedure Laterality Date   . Colonoscopy  12/26/2012     Procedure: COLONOSCOPY;  Surgeon: Gwenith Spitz, MD;  Location: [facility];  Service: Gastroenterology;  Laterality: N/A;   . Colonoscopy, polypectomy  12/26/2012     Procedure: COLONOSCOPY, POLYPECTOMY;  Surgeon: Gwenith Spitz, MD;  Location: [facility];  Service: Gastroenterology;  Laterality: N/A;   . Cholecystectomy     . Pancreatectomy  01/2011     partial       Social History:   Home Living Arrangements  Living Arrangements: Friends  Type of Home: House  Home Layout: Multi-level;Stairs to enter with rails (add number in comment) (4 STE with hand rails)  Bathroom Shower/Tub: Medical sales representative: Standard  DME Currently at Home: Single point cane    Prior Level of Function  Prior level of function: Independent with ADLs;Ambulates independently  Baseline Activity Level: Community ambulation  Driving: independent  Cooking: Yes  Employment: FT  DME Currently at Home: Single point cane    Subjective   Pt noted that she was not having any pain, and was willing to try to participate with therapy.    Patient Goal  Patient Goal: Pt noted that she wanted to go home    Pain:     Pain Assessment  Pain Assessment: No/denies pain          Objective:   Patient's medical condition is appropriate for Physical therapy intervention at this time    Observation of Patient/Vital signs  Patient is in bed with telemetry and peripheral IV in place.    Vital Signs   BP Supine 182/114, pt's RN was made aware mmHg   BP Sitting  mmHg   BP Standing  mmHg   BP c activity  mmHg   HR Supine 84 bpm   HR Sitting  bpm   HR standing  bpm   HR c activity  bpm   SpO2 at rest 95% RA    SpO2 c activity       Edema: None  Skin Inspection: Intact throughout B UE's  Sensation: Intact throughout B UE/LE's, per  pt report      Cognition  Cognition  Arousal/Alertness: Appropriate responses to stimuli  Attention Span: Appears intact  Orientation Level: Oriented X4  Memory: Appears intact  Following Commands: Follows all commands and directions without difficulty  Safety Awareness: independent  Insights:  Fully aware of deficits    Neuro Status  Neuro Status  Behavior: cooperative;calm;attentive  Motor Planning: intact    Musculoskeletal Examination     Gross ROM  Right Lower Extremity ROM: within functional limits  Left Lower Extremity ROM: within functional limits    Gross Strength  Right Lower Extremity Strength: within functional limits  Left Lower Extremity Strength: within functional limits    Tone  Tone: within functional limits    Balance  Balance  Balance: within functional limits             Functional Mobility:   Bed mobility:    Supine to Sit: Independent;to Left  Scooting to EOB: Independent  Sit to Supine: Independent;to Right  Sit to Stand: Independent  Stand to Sit: Independent    Transfers       Locomotion  Ambulation: Independent  Ambulation Distance (Feet): 50 Feet  Pattern: within functional limits, gait training was limited at this time due to pt with elevated resting BP    Participation and Activity Tolerance  Participation and Endurance  Participation Effort: excellent                         Treatment Activities:         "X" All That Apply    Therapeutic Exercise    Functional Activity x   Gait Training x   Stair Training    Neuromuscular Re-education    Balance Training    CPM Adjustment    Self Care Train    DME Adjustments    Brace Fitting/Refit-train    Pt/Family Education x   Wound Care    Manual Therapy    Evaluation x   Other (please specify)       Educated the patient to role of physical therapy, plan of care, goals of therapy and PT d/c recommendations.    Individuals Educated :  (X in all that apply)           Patient x           Spouse/Significant Other            Family            Caregiver            Nursing Staff x           Other (specify)    Strategy:            Explanation x           Demonstration            Handout            Video            Other (specify)    Response:             Verbalized Understanding x           Demonstrated Understanding            Questionable  Understanding            No Understanding            Refused Education  Pt positioned at conclusion of session: (please place "x" in all that apply)   Supine x   Sitting    Family/visitors present     Needs in reach  x   Bed/chair alarm set    No distress x   Distress, RN notified    Affected Extremity Elevated    CPM    SCDs/foot pumps applied    Ice applied    Other  x     PT Team Communication: (please place "x" in all that apply)   Spoke c RN/CNA x   Spoke c RNCM/SW    Spoke c MD/NP/PA    Re: Pt position x   Re: D/C needs x   Re: Pt participation with Therapy x   Re: Vital Signs x   Re: Further Recommendations  x   Re: CPM settings    White board updated     PT/PTA communication: via written note and verbal communication as needed.    Pt's RN notified of session outcome.     Recommend patient up as tolerated outside of and in addition to PT session.    Johnnette Barrios, PT, DPT

## 2014-04-13 NOTE — Progress Notes (Signed)
Spoke to Dr. Minus Liberty regarding pt rectal temp of 102.2 Dr. Minus Liberty ordered motrin 400mg  Q6H. Pt denies pain. Pt stable resting in bed. Call light within reach. Will continue to monitor.

## 2014-04-13 NOTE — Progress Notes (Addendum)
Blood Cultures Drawn and Urine Culture Sent. Will continue to monitor.

## 2014-04-13 NOTE — OT Eval Note (Signed)
[facility]: [facility]  Department of Rehabilitation Services: [phone]    [name]    CSN#: [id number]  MED TELEMETRY STEP-DOWN 363/363-A    Occupational Therapy Evaluation    Consult received for [name] for OT Evaluation and Treatment.  Patient's medical condition is appropriate for Occupational therapy intervention at this time.    Time of treatment:   Time Calculation  OT Received On: 04/13/14  Start Time: 0910  Stop Time: 0924  Time Calculation (min): 14 min    Visit#: 1    Precautions and Contraindications:   NONE    Assessment:      [name] is a 54 y.o. female admitted 04/11/2014 presenting with acute alcohol-induced pancreatitis, hypokalemia, alcohol withdrawal, hypertensive urgency. Patient demonstrates independence with functional activities. No further acute OT needs at this time. Will d/c from OT services.    Patient's current impairments include: appears to be at baseline for ADLs.     Rehabilitation Potential: Good    Risks/benefits/POC discussed: Patient    Plan:   Treatment/interventions: no skilled interventions needed at this time    DISCHARGE RECOMMENDATIONS   DME recommended for Discharge:   None    Discharge Recommendations:   Home with no needs    History of Present Illness:   Medical Diagnosis: Acute pancreatitis, unspecified [K85.9]    [name] is a 54 y.o. female admitted on 04/11/2014 with abdominal pain of two days' duration and nausea and vomiting of one day duration. The patient has been sober for two years, until this Christmas and New Year, the patient started to drink vodka with coke followed by abdominal pain, as if something stabbing her with a knife.It is 10/10 in intensity, continuous, did not radiate to anywhere, but get better with pain medications. She also had nausea and vomiting 8 to 9 times today.  Dx: acute alcohol-induced pancreatitis, hypokalemia, alcohol withdrawal, hypertensive urgency. Now with  OT orders, up as tolerated.    Patient Active Problem List   Diagnosis   . Alcohol induced acute pancreatitis   . Hypertensive urgency   . Hypokalemia   . Acute alcoholic pancreatitis      Past Medical/Surgical History:  Past Medical History   Diagnosis Date   . Hypertension    . Asthma without status asthmaticus    . Gastroesophageal reflux disease    . Pancreatitis    . DVT (deep venous thrombosis)    . S/P IVC filter    . Clostridium difficile colitis    . H/O ETOH abuse      sober 2 years until 03/2014      Past Surgical History   Procedure Laterality Date   . Colonoscopy  12/26/2012     Procedure: COLONOSCOPY;  Surgeon: Gwenith Spitz, MD;  Location: [facility];  Service: Gastroenterology;  Laterality: N/A;   . Colonoscopy, polypectomy  12/26/2012     Procedure: COLONOSCOPY, POLYPECTOMY;  Surgeon: Gwenith Spitz, MD;  Location: [facility];  Service: Gastroenterology;  Laterality: N/A;   . Cholecystectomy     . Pancreatectomy  01/2011     partial       Social History:   Home Living Arrangements  Living Arrangements: Roommate  Type of Home: House  Home Layout: Two level, Able to live on main level with bedroom and bathroom, Doesn't go downstairs  Bathroom:  standard toilet;  tub/shower unit  DME Currently at Home: None    Prior Level of Function  Mobility: Community ambulation  Mobility:  Independent with  No assistive device  Fall History: None     ADLs   Feeding: Independent  Grooming: Independent  UB dressing: Independent  LB dressing: Independent  Bathing: Independent  Toileting: Independent    IADLs:   Driving: Independent  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 Nursing clears patient for therapy.     Patient/caregiver goal for OT: return home    Pain:  At Rest: 0 /10  With Activity: 0/10  Location: N/A  Interventions: None required    OBJECTIVE:   Observation of Patient:    Patient is in bed  with no medical equipment in place. Patient seen with the physical therapist.      Vital Signs:   BP Supine:  185/120 mmHg (manual) RN informed     Oriented to: Oriented x4  Command following: Follows ALL commands and directions without difficulty  Alertness/Arousal: Appropriate responses to stimuli   Attention Span:Appears intact  Memory: Appears intact  Safety Awareness: Independent  Insights: Fully aware of deficits  Problem Solving: Able to problem solve independently  Behavior: Cooperative  Motor planning: Intact  Coordination: Within Normal Limits (WNL)  Hand dominance: Right handed    Musculoskeletal Examination:   Range of motion:  Right UE: Grossly WFL  Left UE: Grossly WFL       Strength:  Right UE: Grossly WFL  Left UE: Grossly WFL    Sensory/Oculomotor Examination:   Auditory:  WFL=intact  Tactile-Light Touch:  WFL=intact  Visual Acuity:  WFL=intact    Activities of Daily Living:   Eating:  Independent;   Grooming: Independent, simulated, patient declined at this time  UB dressing:  Independent, Simulated  LB dressing: Independent, don/doff R shoe, don/doff L shoe, tie shoes, seated at edge of bed  Bathing:  Independent, Simulated;  Toileting:  Independent;    Functional Mobility:   Bed Mobility:  Supine to Sit:   Independent.        Sit to Supine:   Independent.          Transfers:  Sit to Stand:  Independent with No assistive device  Stand to Sit:  Independent  Tub shower: Independent with  No assistive device; simulated  Functional mobility/ambulation: Independent with No assistive device    Balance:   Static Sitting:  WFL  Dynamic Sitting:  WFL  Static Standing:  WFL  Dynamic Standing:  WFL    Participation and Activity Tolerance   Participation effort: Good  Endurance: Activity tolerance does not limit participation  G-codes:   G-codes applicable (current status: Inpatient): no           Education Provided:   Topics: Role of occupational therapy, plan of care, goals of therapy and safety with  mobility and ADLs, benefits of activity, discharge instructions, home safety.    Individuals educated: Patient.  Method: Explanation.  Response to education: Verbalized understanding.    Team Communication:   OT communicated with: RN/LPN  OT communicated regarding: Patient position at end of session, Patient participation with Therapy    Sitting, at the edge of the bed, Needs in reach and No distress    Recommend client to be OOB for all meals & toileting needs & complete ADLs independently.    Allie Dimmer, MS, OTR/L

## 2014-04-13 NOTE — Progress Notes (Signed)
[facility]  PROGRESS NOTE      Patient: [name]  Date: 04/13/2014   LOS: 2 Days  Admission Date: 04/11/2014   MRN: [id number]  Attending: Karren Cobble, MD       SUBJECTIVE   SUBJECTIVE: Fever of 102 last night.She also has cough.    MEDICATIONS     Current Facility-Administered Medications   Medication Dose Route Frequency   . amLODIPine  5 mg Oral Daily   . [START ON 04/14/2014] bisacodyl  10 mg Rectal Daily at 0600   . docusate sodium  100 mg Oral Daily   . enoxaparin  40 mg Subcutaneous Daily   . labetalol  100 mg Oral Q12H SCH   . pantoprazole  40 mg Intravenous Daily   . potassium chloride  60 mEq Oral Once   . senna-docusate  2 tablet Oral QHS     . lactated ringers 100 mL/hr at 04/13/14 0804       ROS   All systems were  reviewed and are negative except those mentioned in the subjective assessment.    PHYSICAL EXAM     Objective:    Intake/Output Summary (Last 24 hours) at 04/13/14 1012  Last data filed at 04/13/14 0440   Gross per 24 hour   Intake    620 ml   Output     50 ml   Net    570 ml     Patient Vitals for the past 24 hrs:   BP Temp Temp src Pulse Resp SpO2 Weight   04/13/14 0817 (!) 168/111 mmHg 98.3 F (36.8 C) Oral 70 18 93 % -   04/13/14 0440 138/85 mmHg 98.2 F (36.8 C) Oral 61 18 92 % 85 kg (187 lb 6.3 oz)   04/13/14 0119 - (!) 101.1 F (38.4 C) Rectal - - - -   04/13/14 0115 - 99 F (37.2 C) Oral - - - -   04/12/14 2347 119/62 mmHg (!) 102.2 F (39 C) Rectal 96 18 94 % -   04/12/14 1951 92/48 mmHg 97.9 F (36.6 C) Oral 83 18 93 % -   04/12/14 1546 133/89 mmHg 98 F (36.7 C) Oral 87 18 95 % -   04/12/14 1155 112/76 mmHg 98.1 F (36.7 C) Oral 99 20 93 % -       Exam:   General: Patient is awake, alert and  in no  distress  Eyes - pupils equal and reactive, non icteric sclera  HENT: supple, without JVD  Chest:  No crackles or wheezing.  CVS: S1, S2 well heard,  no murmurs, or gallops.    Abdomen: soft and non tender with normal bowel sounds.  Genitourinary tract  : No suprapubic or CVA tenderness.  Musculoskeletal: No edema or deformity.   Skin : No rash or echymosis  NEURO:  No Focal motor or sensory  deficits        LABS         Recent Labs  Lab 04/13/14  0305 04/11/14  2318 04/11/14  0942   WBC 8.5 11.9* 11.0   RBC 3.00* 3.99 4.37   HEMOGLOBIN 11.6* 15.3 16.5*   HEMATOCRIT 34.4* 45.1 48.3*   MCV 115* 113* 111*   PLATELETS 212 310 342       Recent Labs  Lab 04/13/14  0631 04/13/14  0305 04/12/14  2244 04/12/14  1824 04/12/14  1430  04/12/14  0536  04/11/14  1426   SODIUM 137  136 135* 134* 134*  < >  --   < > 140   POTASSIUM 3.4* 3.4* 3.4* 3.3* 3.5  < >  --   < > 2.7*   CHLORIDE 104 103 101 101 102  < >  --   < > 100   CO2 25.8 25.3 25.1 24.8 21.6  < >  --   < > 26.8   BUN 11 12 11 12 11   < >  --   < > 10   CREATININE 0.92 1.02 1.06 1.10 1.04  < >  --   < > 0.80   GLUCOSE 107* 102* 108* 113* 100*  < >  --   < > 121*   CALCIUM 8.6 8.5 8.4* 8.7 8.8  < >  --   < > 9.4   MAGNESIUM  --   --   --   --   --   --  1.8  --  2.0   < > = values in this interval not displayed.  No results for input(s): OSMOLUR, NAUR in the last 168 hours.    Invalid input(s): OSMOL    Recent Labs  Lab 04/13/14  0305 04/11/14  1426 04/11/14  0942   ALT 38 53 64*   AST (SGOT) 58* 45* 59*   BILIRUBIN, TOTAL 1.6* 1.4* 1.8*   ALBUMIN 2.9* 3.9 4.5   ALKALINE PHOSPHATASE 168* 219* 247*     No results for input(s): CK, CKMB, CKMBINDEX, TROPI in the last 168 hours.  No results for input(s): INR, PT, PTT in the last 168 hours.     Invalid input(s): [facility]  Microbiology Results     Procedure Component Value Units Date/Time    VRE Culture [[id number]] Collected:  04/11/14 1415    Specimen Information:  Perirectal / Perirectal Updated:  04/12/14 0904          RADIOLOGY     XR Chest 2 Views    (Results Pending)       Assessment and Plan:     --Fever;urinalysis negative,chest xray,blood culture,levaquin    --Acute alcohol-induced pancreatitis ;Started diet,ringer lactate at 100  cc/hour    --Hypertension;labetalol,amlodipin    --hypokalemia; resolved    --Alcoholic hepatitis; advised her not to drink alcohol    DVT prophylaxis: Lovenox    DISPOSITION; Patient has fever and will stay in the hospital for 2 days.    Karren Cobble, MD  04/13/2014 10:12 AM

## 2014-04-13 NOTE — Progress Notes (Signed)
Returned form xray  

## 2014-04-13 NOTE — Plan of Care (Signed)
Problem: Pain  Goal: Patient's pain/discomfort is manageable  Outcome: Progressing  Pt denies pain 0/10. PT resting in bed. Call bell within reach, bed in lowest position, will continue to monitor.

## 2014-04-13 NOTE — Plan of Care (Signed)
Problem: Altered GI Function  Goal: Fluid and electrolyte balance are achieved/maintained  Outcome: Progressing  Pt complaining of excessive gas, gave pericolace to aid in bowel movement; pt given 2mg  dilaudid tab for abd pain; pt on contact precautions for VRE; CIWAs Q8, next one @0100  04/14/2014--have been negative so far; pt instructed to call RN if abd pain increases or changes; pt instructed to call RN if any symptoms of alcohol withdraw occur; pt agreeable w/ POC

## 2014-04-13 NOTE — Progress Notes (Addendum)
[facility]  [address]  [place] [address]    INITIAL ASSESSMENT  Case Management       Estimated D/C Date: 04/14/2014      PCP/Last visit: none - agreeable to transition clinic referral       Home assessment/PLOF: Pt lives in a home with a roommate and is completely independent.       Financials and Insurance:  [employer] - not on file, Pt didn't bring card to hospital. [facility] screened      Community Services: none      DME's/Supplier: none      Inpatient Plan of Care: Admitted for acute pancreatitis. Hypertensive urgency. Labetalol gtt [place]. Febrile today blood, urine cultures pending.       CM Interventions: CM assessment completed, spoke with Pt at bedside to discuss d/c plan. Pt denies CM needs. Discussed referral to transition clinic for assistance with establishing a PCP.      Transportation: Pts car is in the [facility] parking lot. Arrangements have been made for a friend to come and drive the Pt home in her own car.       Barriers to discharge:  none      D/C Plan and Needs:  Febrile, blood/urine cx pending. Plan d/c home with no needs, referral to transition clinic in d/c navigator.         Eston Mould  RN, BSN  Nurse Case Manager  [facility]  [phone]

## 2014-04-13 NOTE — Progress Notes (Signed)
Pharmacy IV to PO Conversion Note    Previous IV dose: pantoprazole 40mg IV q24h  New oral dose: pantoprazole 40mg po q24h    1.  The patient met the following conversion criteria and can receive oral therapy.    Able to eat or tolerate enteral feeding OR   Receiving enteral nutrition by the oral, gastric, or nasogastric tube OR   Receiving other scheduled medications by the oral route    2.  Patient does not have any of the following:    Active GI bleed    Unable to swallow, refuses oral medication, or is strict NPO    Presence of nausea, vomiting, or diarrhea in the past 24 hours   History of gastrointestinal obstruction, malabsorption syndrome, or ileus   Physician indicated that the patient should not be switched to oral therapy       If you have any questions, please contact the pharmacist at [phone].  [facility] Department

## 2014-04-14 ENCOUNTER — Inpatient Hospital Stay: Payer: Commercial Managed Care - POS

## 2014-04-14 LAB — CBC AND DIFFERENTIAL
Basophils %: 0.6 % (ref 0.0–3.0)
Basophils Absolute: 0 10*3/uL (ref 0.0–0.3)
Eosinophils %: 2 % (ref 0.0–7.0)
Eosinophils Absolute: 0.1 10*3/uL (ref 0.0–0.8)
Hematocrit: 35.3 % — ABNORMAL LOW (ref 36.0–48.0)
Hemoglobin: 11.6 gm/dL — ABNORMAL LOW (ref 12.0–16.0)
Lymphocytes Absolute: 1.5 10*3/uL (ref 0.6–5.1)
Lymphocytes: 23.3 % (ref 15.0–46.0)
MCH: 38 pg — ABNORMAL HIGH (ref 28–35)
MCHC: 33 gm/dL (ref 32–36)
MCV: 115 fL — ABNORMAL HIGH (ref 80–100)
MPV: 7 fL (ref 6.0–10.0)
Monocytes Absolute: 0.6 10*3/uL (ref 0.1–1.7)
Monocytes: 9.6 % (ref 3.0–15.0)
Neutrophils %: 64.6 % (ref 42.0–78.0)
Neutrophils Absolute: 4.1 10*3/uL (ref 1.7–8.6)
PLT CT: 222 10*3/uL (ref 130–440)
RBC: 3.07 10*6/uL — ABNORMAL LOW (ref 3.80–5.00)
RDW: 13.6 % (ref 11.0–14.0)
WBC: 6.3 10*3/uL (ref 4.0–11.0)

## 2014-04-14 LAB — COMPREHENSIVE METABOLIC PANEL
ALT: 31 U/L (ref 0–55)
AST (SGOT): 41 U/L (ref 10–42)
Albumin/Globulin Ratio: 0.93 Ratio (ref 0.70–1.50)
Albumin: 2.8 gm/dL — ABNORMAL LOW (ref 3.5–5.0)
Alkaline Phosphatase: 156 U/L — ABNORMAL HIGH (ref 40–145)
Anion Gap: 12.6 mMol/L (ref 7.0–18.0)
BUN / Creatinine Ratio: 9.5 Ratio — ABNORMAL LOW (ref 10.0–30.0)
BUN: 7 mg/dL (ref 7–22)
Bilirubin, Total: 0.9 mg/dL (ref 0.1–1.2)
CO2: 22.3 mMol/L (ref 20.0–30.0)
Calcium: 8.9 mg/dL (ref 8.5–10.5)
Chloride: 106 mMol/L (ref 98–110)
Creatinine: 0.74 mg/dL (ref 0.60–1.20)
EGFR: >60 mL/min/{1.73_m2}
Globulin: 3 gm/dL (ref 2.0–4.0)
Glucose: 114 mg/dL — ABNORMAL HIGH (ref 70–99)
Osmolality Calc: 273 mOsm/kg — ABNORMAL LOW (ref 275–300)
Potassium: 3.9 mMol/L (ref 3.5–5.3)
Protein, Total: 5.8 gm/dL — ABNORMAL LOW (ref 6.0–8.3)
Sodium: 137 mMol/L (ref 136–147)

## 2014-04-14 MED ORDER — SIMETHICONE 80 MG PO CHEW
80.0000 mg | CHEWABLE_TABLET | Freq: Four times a day (QID) | ORAL | Status: AC | PRN
Start: 2014-04-14 — End: 2014-04-19

## 2014-04-14 MED ORDER — AMLODIPINE BESYLATE 5 MG PO TABS
5.0000 mg | ORAL_TABLET | Freq: Every day | ORAL | Status: DC
Start: 2014-04-14 — End: 2014-04-14

## 2014-04-14 MED ORDER — AMLODIPINE BESYLATE 5 MG PO TABS
10.0000 mg | ORAL_TABLET | Freq: Every day | ORAL | Status: DC
Start: 2014-04-15 — End: 2014-04-14

## 2014-04-14 MED ORDER — SIMETHICONE 80 MG PO CHEW
80.0000 mg | CHEWABLE_TABLET | Freq: Four times a day (QID) | ORAL | Status: DC | PRN
Start: 2014-04-14 — End: 2014-04-14
  Administered 2014-04-14: 80 mg via ORAL
  Filled 2014-04-14 (×2): qty 1

## 2014-04-14 MED ORDER — AMLODIPINE BESYLATE 10 MG PO TABS
10.0000 mg | ORAL_TABLET | Freq: Every day | ORAL | Status: DC
Start: 2014-04-15 — End: 2014-04-24

## 2014-04-14 MED ORDER — PANTOPRAZOLE SODIUM 40 MG PO TBEC
40.0000 mg | DELAYED_RELEASE_TABLET | Freq: Every morning | ORAL | Status: DC
Start: 2014-04-14 — End: 2014-04-24

## 2014-04-14 MED ORDER — LABETALOL HCL 200 MG PO TABS
100.0000 mg | ORAL_TABLET | Freq: Once | ORAL | Status: AC
Start: 2014-04-14 — End: 2014-04-14
  Administered 2014-04-14: 100 mg via ORAL
  Filled 2014-04-14: qty 1

## 2014-04-14 MED ORDER — LEVOFLOXACIN 750 MG PO TABS
750.0000 mg | ORAL_TABLET | Freq: Every day | ORAL | Status: AC
Start: 2014-04-14 — End: 2014-04-19

## 2014-04-14 MED ORDER — LABETALOL HCL 100 MG PO TABS
200.0000 mg | ORAL_TABLET | Freq: Two times a day (BID) | ORAL | Status: DC
Start: 2014-04-14 — End: 2014-04-24

## 2014-04-14 MED ORDER — CALCIUM CARBONATE ANTACID 500 MG PO CHEW
500.0000 mg | CHEWABLE_TABLET | Freq: Four times a day (QID) | ORAL | Status: DC | PRN
Start: 2014-04-14 — End: 2014-04-14
  Administered 2014-04-14 (×2): 500 mg via ORAL
  Filled 2014-04-14 (×2): qty 1

## 2014-04-14 NOTE — Discharge Summary (Signed)
[facility]      Patient: [name]  Admission Date: 04/11/2014   DOB: 29-Aug-1960  Discharge Date: 04/14/2014    MRN: [id number]  Discharge Attending:Elbert Spickler D Bonnita Levan, MD   Referring Physician: Christa See, MD  PCP: Christa See, MD       DISCHARGE SUMMARY     Discharge Information       Discharge Diagnoses:   Active Problems:  Acute alcoholic pancreatitis  Pneumonia  Fever secondary to pneumonia  Hypertensive urgency           Consult: none      Discharge Medications:     Medication List      START taking these medications          * amLODIPine 5 MG tablet   Commonly known as:  NORVASC   Take 1 tablet (5 mg total) by mouth daily.       * amLODIPine 10 MG tablet   Commonly known as:  NORVASC   Take 1 tablet (10 mg total) by mouth daily.   Start taking on:  04/15/2014       labetalol 100 MG tablet   Commonly known as:  NORMODYNE   Take 2 tablets (200 mg total) by mouth every 12 (twelve) hours.       levofloxacin 750 MG tablet   Commonly known as:  LEVAQUIN   Take 1 tablet (750 mg total) by mouth daily.       pantoprazole 40 MG tablet   Commonly known as:  PROTONIX   Take 1 tablet (40 mg total) by mouth every morning before breakfast.       simethicone 80 MG chewable tablet   Commonly known as:  MYLICON   Chew 1 tablet (80 mg total) by mouth every 6 (six) hours as needed for Flatulence.       * Notice:  This list has 2 medication(s) that are the same as other medications prescribed for you. Read the directions carefully, and ask your doctor or other care provider to review them with you.      STOP taking these medications          acetaminophen 500 MG tablet   Commonly known as:  TYLENOL       omeprazole 20 MG capsule   Commonly known as:  PriLOSEC         Where to Get Your Medications     These are the prescriptions that you need to pick up.         You may get the following medications from any pharmacy   -  amLODIPine 10 MG tablet   -  amLODIPine 5 MG tablet   -  labetalol 100  MG tablet   -  levofloxacin 750 MG tablet   -  pantoprazole 40 MG tablet   -  simethicone 80 MG chewable tablet                          Hospital Course   History of  Hospital Course    [name] is a 54 y.o. female with past medical history of alcohol abuse,  alcohol associated chronic pancreatitis, and recurrent  pancreatitis, hypertension, who has not been taking any  medication, Enterococcus bacteremia, and VRE, came to our  hospital due to abdominal pain of two days' duration and nausea  and vomiting of one day duration. The patient has been sober  for two years,  until this Christmas and New Year, the patient  started to drink vodka with coke followed by abdominal pain, as  if something stabbing her with a knife.It is 10/10 in intensity,  continuous, does not radiate to anywhere, but get better with  pain medications. She also had nausea and vomiting 8 to 9 times  today. The patient was on blood pressure medications while she  was in the hospital, but since her discharge in 2013, she has  never been on any blood pressure medications.We admitted her to our hospital and treated her with ringer lactate and her abdominal pain completely subsided.She developed fever and we did chest xray which showed pneumonia.We treated her with levaquin which resolved her fever.Patient had uncontrolled hypertension and we started her on medications.Patient is not compliant with her medications and advised her to take her medications.I also advised her not to drink alcohol as it is the cause of her pancreatitis.       Procedures/Imaging:   XR Chest 2 Views   Final Result   Faint bibasilar airspace disease is present. This is suspected to represent atelectasis but developing consolidation   cannot be definitively excluded.      ReadingStation:[id number]      XR Chest 2 Views    (Results Pending)        Treatment Team:   Attending Provider: Karren Cobble, MD         Progress Note/Physical Exam at Discharge        Filed Vitals:    04/14/14 0559 04/14/14 0746 04/14/14 0946 04/14/14 1119   BP: 164/96 185/116 162/97 164/110   Pulse:  65 80 76   Temp:  98.3 F (36.8 C)  98.7 F (37.1 C)   TempSrc:  Oral  Oral   Resp:  16  18   Height:       Weight:       SpO2:  97%  95%       Exam:   General: Patient is awake, in no acute distress  Eyes - pupils equal and reactive, non icteric sclera.  Mouth - mucous membranes moist, pharynx normal without lesions  Neck: supple, without JVD  Chest: . No crackles or wheezing.  CVS: S1, S2  Well heard normal,  no murmurs or gallops.   Abdomen: soft and non tender with normal bowel sounds.  Musculoskeletal: No  edema, or deformity.  NEURO:Alert and oriented x 3. No gross focal motor or sensory neurological deficits  Skin: No rashes or echymosis.       Diagnostics     Labs/Studies Pending at Discharge: No    Last Labs     Recent Labs  Lab 04/14/14  0440 04/13/14  0305 04/11/14  2318   WBC 6.3 8.5 11.9*   RBC 3.07* 3.00* 3.99   HEMOGLOBIN 11.6* 11.6* 15.3   HEMATOCRIT 35.3* 34.4* 45.1   MCV 115* 115* 113*   PLATELETS 222 212 310         Recent Labs  Lab 04/14/14  0440 04/13/14  1817 04/13/14  1421 04/13/14  1025 04/13/14  0631  04/12/14  0536  04/11/14  1426   SODIUM 137 136 135* 137 137  < >  --   < > 140   POTASSIUM 3.9 3.7 4.1 3.6 3.4*  < >  --   < > 2.7*   CHLORIDE 106 105 103 104 104  < >  --   < > 100   CO2 22.3 23.2  23.0 24.9 25.8  < >  --   < > 26.8   BUN 7 10 9 11 11   < >  --   < > 10   CREATININE 0.74 0.77 0.81 0.86 0.92  < >  --   < > 0.80   GLUCOSE 114* 124* 111* 134* 107*  < >  --   < > 121*   CALCIUM 8.9 8.6 8.7 8.8 8.6  < >  --   < > 9.4   MAGNESIUM  --   --   --   --   --   --  1.8  --  2.0   < > = values in this interval not displayed.          Microbiology Results     Procedure Component Value Units Date/Time    Blood Culture - Venipuncture [[id number]] Collected:  04/13/14 0029    Specimen Information:  Blood / Venipuncture Updated:  04/13/14 2352    Narrative:       Specimen/Source: Blood/Venipuncture  Collected: 04/13/2014 00:29     Status: Valued      Last Updated: 04/13/2014 23:46                Culture Result (Prelim)      No Growth To Date          Urine Culture [[id number]] Collected:  04/13/14 0037    Specimen Information:  Urine / Voided Updated:  04/14/14 1100    Narrative:      Specimen/Source: Urine Specimens/Voided  Collected: 04/13/2014 00:37     Status: Valued      Last Updated: 04/14/2014 11:00                Culture Result (Prelim)      No Growth - Day 1, Reincubate          VRE Culture [[id number]] Collected:  04/11/14 1415    Specimen Information:  Perirectal / Perirectal Updated:  04/13/14 0625    Narrative:      Specimen/Source: Perirectal/Perirectal  Collected: 04/11/2014 14:15     Status: Final      Last Updated: 04/13/2014 06:25                Culture Result (Final)      No VRE Isolated                 Patient Instructions   Discharge Diet: cardiac diet  Discharge Activity:  activity as tolerated    Follow Up Appointment:  Follow-up Information     Follow up with [facility]. Call in 1 week.    Specialty:  Internal Medicine    Why:  for follow-up and assitance establishing a PCP    Contact information:    [address]  [place] [address]  [phone]    Additional information:    Located at [facility]           Time spent examining patient, discussing with patient/family regarding hospital course, chart review, reconciling medications and discharge planning: 45 minutes.      Karren Cobble, MD  11:25 AM 04/14/2014     Cc: PCP

## 2014-04-14 NOTE — Progress Notes (Signed)
Assumed care of patient. Patient is resting in bed with no complaints at this time. IV and tele intact. POC discussed with patient and whiteboard was updated. Call bell within reach. Will continue to monitor.

## 2014-04-14 NOTE — Plan of Care (Signed)
Problem: Pain  Goal: Patient's pain/discomfort is manageable  Outcome: Progressing  Patient instructed to use call bell to notify nurse when PRN pain medication is needed.

## 2014-04-14 NOTE — Progress Notes (Addendum)
Patient's BP @0520  185/104. Gave 10mg  hydralazine, rechecked @0600 , now 164/96. Spoke w/ Dr. Minus Liberty, made aware of BP, no further orders at this time. Will continue to monitor.

## 2014-04-14 NOTE — Discharge Instructions (Signed)
Advised her not to drink alcohol        Heart healthy diet.          Discharge Instructions for Acute Pancreatitis  You have been diagnosed with acute pancreatitis. Your pancreas is inflamed or swollen. The pancreas is an organ that produces chemicals and hormones that help you digest food and use sugar for energy. Gallstones are one of the most common causes of this condition. These hard stones form in the gallbladder, which shares a passage with the pancreas into the small intestine. If gallstones block this passage, fluid can't escape the pancreas. The fluid backs up and causes inflammation.  Immediate home care   Find someone to drive you to appointments. Acute pancreatitis is a serious condition, and you should never drive if you are experiencing symptoms.   Stop drinking if your illness was caused by alcohol.   Ask your doctor about alcohol abuse programs and support groups such as Alcoholics Anonymous.   Ask your doctor about prescription medications that can help you stop drinking.   Take your medications exactly as directed. Don't skip doses.   Eat a low-fat diet. Ask your doctor for menus and other diet information.   Learn to take your own pulse. Keep a record of your results. Ask your doctor which readings mean that you need medical attention.  Ongoingcare   Tell your doctor about any medications you are taking. Some can cause acute pancreatitis.   Tell your doctor if you lose weight without dieting.   Watch for symptoms that indicate your pancreatitis has returned. These symptoms include abdominal pain, nausea and vomiting, and fever.   Keep all follow-up appointments with your doctor. Delayed complications are common with this illness.  Follow-up  Make a follow-up appointment as directed by our staff.     [address] [employer] [address] [place], [place] [address]. All rights reserved. This information is not intended as a substitute for professional medical care.  Always follow your healthcare professional's instructions.            Discharge Instructions for High Blood Pressure (Hypertension)  You have been diagnosed with high blood pressure (also called hypertension). This means the force of blood against your artery walls is too strong. It also means your heart is working hard to move blood. High blood pressure usually has no symptoms, but over time, it can damage your heart, blood vessels, eyes, kidneys, and other organs. With help from your doctor, you can manage your blood pressure and protect your health.  Taking medications   Learn to take your own blood pressure. Keep a record of your results. Ask your doctor which readings mean that you need medical attention.   Take your blood pressure medication exactly as directed. Don't skip doses. Missing dosescan cause your blood pressure to get out of control.   Avoid medications that contain heart stimulants, including over-the-counter drugs. Check for warnings about high blood pressure on the label.   Check with your doctor before taking a decongestant. Some decongestants can worsen high blood pressure.  Lifestyle changes   Maintain a healthy weight. Get help to lose any extra pounds.   Cut back on salt.   Limit canned, dried, packaged, and fast foods.   Don't add salt to your food at the table.   Season foods with herbs instead of salt when you cook.   Follow the DASH (Dietary Approaches to Stop Hypertension) eating plan. This plan recommends vegetables, fruits,  whole gains, and other heart healthy foods.   Begin an exercise program. Ask your doctor how to get started. The American Heart Association recommends aerobic exercise 3 to 4 times a week for an average of 40 minutes at a time, with your doctor's approval. Simple activities like walking or gardening can help.   Break the smoking habit. Enroll in a stop-smoking program to improve your chances of success. Ask your health care provider about programs and  medications to help you stop smoking.   Limit drinks that contain caffeine (coffee, black or green tea, cola) to 2 per day.   Never take stimulants such as amphetamines or cocaine; these drugs can be deadly for someone with high blood pressure.   Control your stress. Learn stress-management techniques.   Limit alcohol to no more than 1 drink a day for women and 2 drinks a day for men.  Follow-up care  Make a follow-up appointment as directed by our staff.     [address] [employer] [address], [place], [place] [address]. All rights reserved. This information is not intended as a substitute for professional medical care. Always follow your healthcare professional's instructions.              Understanding Alcoholism    Alcoholism is a disease in which a person is dependent on a drug -- alcohol. The disease can harm the person's physical and mental health. It can also affect his or her behavior. Alcoholism is not a character flaw. It is not a moral failure. It is a disease that worsens over time. Untreated, it can lead to brain damage and death. Recovery is possible if drinking is stopped.     [address] [employer] [address] [place], [place] [address]. All rights reserved. This information is not intended as a substitute for professional medical care. Always follow your healthcare professional's instructions.          Alcoholism: Getting Help  Facing a problem with alcohol can be hard. Once a person decides to get help, it can be found in many places. Below you will find resources that can give you more information. They can also help you find treatment.    Primary care  Speak with your primaryhealth care provider. Sometimes yourhealth care providercan provide medication to help you stop drinking. If not, he or she can refer you to a specialist.  Professional care  This kind of care can be inpatient. It means you spend a period of time in a facility. Or it can be  outpatient. This means you come and go. The facilities have medical support and can help a person detox. Most health insurance plans will cover at least some treatment. To find this kind of care, talk to your health care provider or a counselor. Or go to a mental health clinic and ask for information. You can also go online to: Substance Abuse and Mental Health Services Administration at RandomSearching.hu.  Alcoholics anonymous (AA)  AA helps members get sober and stay sober. They help you build healthy patterns of living. Everyone is welcome at an AA meeting. You do not have to identify yourself. Some people find it easier to go to the first meeting with a friend. To find a meeting near you, contact AA online at SalaryStart.tn. Or look in the phone book for the number of a local chapter.  The road to recovery  Many people with alcoholism can give up alcohol for good. But  change may not be easy or quick. Treatment is only a start. Relapses can be common. A relapse is not a sign of failure. Instead, it means treatment should continue. Once a person stops drinking, support is needed for them to stay sober. After care programs and groups, such as AA, are good for this kind of support.     [address] [employer] [place], [place] [address]. All rights reserved. This information is not intended as a substitute for professional medical care. Always follow your healthcare professional's instructions.

## 2014-04-14 NOTE — Progress Notes (Signed)
Patient has been complaining of gas this morning. CIWA's have been 0 for 2 days. Called Dr. Bonnita Levan about these issues. MD ordered the CIWA's to be d/c and gas-x for the gas. Orders placed.

## 2014-04-14 NOTE — Progress Notes (Signed)
Pt resting in bed w/ call bell in reach. No acute distress or needs at this time. Will give report to oncoming nurse

## 2014-04-14 NOTE — Progress Notes (Signed)
Patient still feeling very gassy, called Dr. Minus Liberty who ordered Tums. I will give once they arrive from the pharmacy. I also gave the patient ginger ale. Will continue to monitor.

## 2014-04-14 NOTE — Progress Notes (Signed)
Discharge instructions given to patient. All questions were answered and patient verbalized understanding. IV and tele removed. Wheelchair ordered.

## 2014-04-21 ENCOUNTER — Ambulatory Visit: Payer: Self-pay | Admitting: Family

## 2014-04-23 ENCOUNTER — Ambulatory Visit: Payer: Commercial Managed Care - POS | Attending: Family | Admitting: Family

## 2014-04-23 ENCOUNTER — Encounter: Payer: Self-pay | Admitting: Family

## 2014-04-23 VITALS — BP 122/72 | HR 71 | Temp 97.4°F | Resp 22 | Ht 68.0 in | Wt 188.0 lb

## 2014-04-23 DIAGNOSIS — Z09 Encounter for follow-up examination after completed treatment for conditions other than malignant neoplasm: Secondary | ICD-10-CM

## 2014-04-23 DIAGNOSIS — K860 Alcohol-induced chronic pancreatitis: Secondary | ICD-10-CM

## 2014-04-23 DIAGNOSIS — E876 Hypokalemia: Secondary | ICD-10-CM | POA: Insufficient documentation

## 2014-04-23 DIAGNOSIS — K852 Alcohol induced acute pancreatitis: Secondary | ICD-10-CM | POA: Insufficient documentation

## 2014-04-23 DIAGNOSIS — Z720 Tobacco use: Secondary | ICD-10-CM

## 2014-04-23 DIAGNOSIS — I10 Essential (primary) hypertension: Secondary | ICD-10-CM | POA: Insufficient documentation

## 2014-04-23 DIAGNOSIS — Z8719 Personal history of other diseases of the digestive system: Secondary | ICD-10-CM

## 2014-04-23 DIAGNOSIS — Z87898 Personal history of other specified conditions: Secondary | ICD-10-CM

## 2014-04-23 LAB — I-STAT CHEM 8 CARTRIDGE
Anion Gap I-Stat: 23 — ABNORMAL HIGH (ref 7–16)
BUN I-Stat: 3 mg/dL — ABNORMAL LOW (ref 6–20)
Calcium Ionized I-Stat: 4.6 mg/dL (ref 4.35–5.10)
Chloride I-Stat: 104 mMol/L (ref 98–112)
Creatinine I-Stat: 0.9 mg/dL (ref 0.60–1.10)
EGFR: >60 mL/min/{1.73_m2}
Glucose I-Stat: 100 mg/dL — ABNORMAL HIGH (ref 70–99)
Hematocrit I-Stat: 38 % (ref 36.0–48.0)
Hemoglobin I-Stat: 12.9 gm/dL (ref 12.0–16.0)
Potassium I-Stat: 3.3 mMol/L — ABNORMAL LOW (ref 3.5–5.3)
Sodium I-Stat: 142 mMol/L (ref 135–145)
TCO2 I-Stat: 19 mMol/L — ABNORMAL LOW (ref 24–29)

## 2014-04-23 MED ORDER — POTASSIUM CHLORIDE CRYS ER 20 MEQ PO TBCR
40.0000 meq | EXTENDED_RELEASE_TABLET | Freq: Once | ORAL | Status: AC
Start: 2014-04-23 — End: 2014-04-23
  Administered 2014-04-23: 40 meq via ORAL

## 2014-04-23 NOTE — Progress Notes (Signed)
Subjective:    Patient ID: [name] is a 54 y.o. female. She presents to the transition clinic for assistance with finding a primary care provider.  She was recently hospitalized from 04-11-14 through 04-14-14 with abdominal bloating and vomiting yellow emesis.  She reports she had consumed alcohol on New Year's Eve and this may have been a contributing factor. She was admitted with acute alcoholic pancreatitis, pneumonia and hypertensive urgency.     Since her hospital discharge, reports she has completed the Levaquin prescription.  Denies any abdominal pain, nausea, vomiting, diarrhea, fever or chills. Reports she has been feeling well, except for muscle weakness in upper legs and shoulders. Denies any numbness, loss of sensation or loss of motor function.  Has returned to work.     She was discharged home on the following medications and reports she is taking these consistently, with adequate supply through 05-15-14, and then will need refills:  Norvasc 10 mg by mouth once a day  Labetalol 200 mg by mouth every 12 hours  Protonix 40 mg by mouth once a day    Reports her PMH is significant for hypertension, pancreatitis with episode three years ago that required hospitalization (at [facility]) with surgery; "they cut me open and scrapped out my dead pancreas and gave me 12 hours to live"; and reports a "filter" was put in her right groin while at [facility].  Reports had a feeding tube for 6 months following that hospitalization.  She denies any known history of diabetes, thyroid disease, DVT or PE.  She reports she has decreased her cigarette smoking down to 4 cigarettes a day and intends to completely quit.  Reports she consumes alcoholic beverage "once and a while"; denies any alcohol intake since hospital discharge. Denies illicit drug use.     Reports she used to follow with the [facility] as her primary care provider up to a year ago.  She is now working and has [employer], applications and would like to  establish with a PCP in [place].    HPI    Review of Systems   Constitutional: Negative for fever and chills.   HENT: Negative for trouble swallowing.    Eyes: Negative for visual disturbance.   Respiratory: Negative for cough, shortness of breath and wheezing.    Cardiovascular: Positive for leg swelling (reports bialteral, mild edema in feet at the end of the work day). Negative for chest pain and palpitations.   Gastrointestinal: Negative for nausea, vomiting, abdominal pain, diarrhea, constipation, blood in stool and abdominal distention.        Admits, she does not drink much water/fluids. Reports appetite has increased and is eating regular diet without difficulty.    Genitourinary: Negative for dysuria, hematuria and difficulty urinating.   Musculoskeletal: Negative for joint swelling and gait problem.   Skin: Negative for rash.   Neurological: Positive for weakness (reports her upper leg and shoulder muscles feel weak; denies any loss of motor movement or loss of sensation or numbness). Negative for dizziness, tremors, seizures, numbness and headaches.        Denies any falls.   Hematological: Does not bruise/bleed easily.        Denies any bleeding or new bruising           Objective:    Physical Exam   Constitutional: She is oriented to person, place, and time. She appears well-developed and well-nourished. No distress.   HENT:   Head: Normocephalic and atraumatic.  Eyes: No scleral icterus.   Neck:   No carotid bruits auscultated    Cardiovascular: Normal rate, regular rhythm, normal heart sounds and intact distal pulses.    Pulmonary/Chest: Effort normal and breath sounds normal. No stridor. No respiratory distress. She has no wheezes. She has no rales.   Abdominal: Soft. Bowel sounds are normal. There is no tenderness. There is no guarding.   Musculoskeletal: Normal range of motion. She exhibits edema (+1 bilateral lower extremity/pedal edema; no redness; no streaking; no warmth; no tenderness). She  exhibits no tenderness.   Neurological: She is alert and oriented to person, place, and time. Coordination normal.   Skin: Skin is warm and dry. She is not diaphoretic.   Psychiatric: She has a normal mood and affect. Her behavior is normal. Judgment and thought content normal.     BP 122/72 mmHg  Pulse 71  Temp(Src) 97.4 F (36.3 C) (Oral)  Resp 22  Ht 1.727 m (5\' 8" )  Wt 85.276 kg (188 lb)  BMI 28.59 kg/m2  SpO2 94% chem 8 in the office today resulted sodium 142, potassium 3.3, chloride 104, ionized calcium 4.6, CO2 19, glucose 100, BUN 3, creatinine 0.9, hemoglobin 12.9, and hematocrit 38.         Assessment:       Hospital discharge follow up  History of pancreatitis  History of alcohol intake  Hypokalemia  Hypertension   Tobacco use      Plan:       Met with patient.  Reviewed her lab results from recent hospitalization and her chem 8 results today.  She was given a one-time dose of oral K-Dur 40 meq in the office today.  I reviewed foods high in potassium to include in her daily nutritional intake.  Provided her with a lab referral to have a chem 8 done at the [facility] next week.  At the patient's request, the transition clinic has called [facility] to see if they will be her PCP, and left a voice mail.  The transition clinic front desk will follow up with the patient on this.  I reviewed contact information for [facility]; items to take to that appointment; and to complete any intake paper work that [employer] may send her and return it to that office prior to her appointment there.  Reviewed importance of abstinence from alcohol intake; reviewed high mortality rate associated with pancreatitis and correlation between alcohol intake and pancreatitis. She verbalizes understanding.  Reviewed the importance of smoking cessation; she is aware of this and plans to continue to decrease cigarette use and ultimately quit.  Reviewed her B/P reading in the office today and importance of B/P control to avoid  complications.  I refilled her medications to her pharmacy for a 30 day supply; no refills.  Reviewed signs to seek medical attention.  Printed out her AVS; reviewed this with her and provided her with a copy.   Ensured that she has phone numbers to transition clinic if questions arise.   She verbalizes understanding of the plan of care.

## 2014-04-23 NOTE — Progress Notes (Signed)
[name]  10-09-1960  54 y.o.    HPI: Patient presents to transition clinic for follow-up. Reports vomiting yellow bile, abdominal pain, and bloating on presentation to the ED.     Subjective:  Chief Complaint: generalized muscle "aching" - worse know than prior to hospital admission. Pain is greatest in knees and shoulders when lifting arms overhead.   Patient is "extremeley concerned" about her pancreas. Reports all her pancreas medications from [facility] were stopped after her admission. She reports being placed on multiple pancrease medications after a 3 month hospital stay at [facility]. She reports that Nexium and a "pancrea/lipase that is sprinkled onto food". Patient is a former patient at the [facility], a patient of Sally's. Last seen at [facility] >1 year ago. Patients son passed away suddenly at 48 years of age, began smoking again after her son's death.     PMH: HTN, pancreatitis  Denies thyroid disease, clots/DVT (filter present),     Surgical:   Cholecystectomy: 3 1/2-4 years ago at [facility]  Reports abdomen was "opened up and the old pancreas was scraped out" at [facility]  Hx feeding tube in her abdomen for 6 months  Filter in right groin; "precautionary measure" per patient    Social:  Tobacco: smokes 4 cigarettes/day - quit for 10 years; intends to quit  ETOH: occasionally - for the last 10 years; no intake since discharge  Denies illicit drug use      Objective:   BP 122/72 mmHg  Pulse 71  Temp(Src) 97.4 F (36.3 C) (Oral)  Resp 22  Ht 1.727 m (5\' 8" )  Wt 85.276 kg (188 lb)  BMI 28.59 kg/m2  SpO2 94%     Current outpatient prescriptions: amLODIPine (NORVASC) 10 MG tablet, Take 1 tablet (10 mg total) by mouth daily., Disp: 30 tablet, Rfl: 0;  labetalol (NORMODYNE) 100 MG tablet, Take 2 tablets (200 mg total) by mouth every 12 (twelve) hours., Disp: 120 tablet, Rfl: 0;  pantoprazole (PROTONIX) 40 MG tablet, Take 1 tablet (40 mg total) by mouth every morning before breakfast., Disp: 30  tablet, Rfl: 0    Gae Gallop, RN-BSN, Nurse Practitioner Student

## 2014-04-23 NOTE — Progress Notes (Signed)
Reason for Visit: After the patient recent hospital stay is scheduled for follow up care in the Transition Center and to assist establishing care with a PCP. Assessment: Chart reviewed to help determine transition to appropriate provider. The patient was hospitalized 04/11/14  through 04/18/14 the patient is insured with Rx coverage. Plan:  Potential options for transitions to primary care discussed with the FNP-c.   The patient came to follow up appointment and option for in-network providers given .Referral to called to [facility].  The staff made attempt to schedule his follow-up but the had to leave a message. Once an appointment is secure for her the staff will call her and mail her date and time of new PCP appointment   Chronic Care Management Progress Note     04/23/2014  5:07 PM  Care Team Member: Lynita Lombard Kalany Diekmann, RN  Patient Active Problem List    Diagnosis Date Noted   . Essential hypertension 04/23/2014   . Hypokalemia 04/23/2014   . History of pancreatitis 01/21/2011

## 2014-04-23 NOTE — Patient Instructions (Signed)
The transition clinic is calling [facility] to see if they will be your primary care provider. The transition clinic will let you know if you have an appointment at that office.    Please continue to follow your hospital discharge instructions.    Please go to the outpatient diagnostic Center by January 21st 2016 to have a chem 8 done. This will follow your potassium level. The outpatient diagnostic Center is open Monday through Friday 8 AM to 8 PM, and on Saturdays from 8 AM to 12 noon. You do not need an appointment. The outpatient diagnostic Center is located behind the hospital, across from the wellness Center.    You may call the transition clinic if you have questions, or if you need an appointment before you get in with your primary care provider. The phone number to the transition clinic is [phone].    Please review the following:      Discharge Instructions: Eating a High-Potassium Diet  Your doctor has told you to eat a high-potassium diet. High-potassium diets are prescribed to people with low blood levels of potassium. They are also often prescribed to people with high blood pressure (hypertension). Potassium is found in many foods. This includes dairy products, nuts, seeds, and beans. It's also found in many fruits and vegetables in high amounts.  Guidelines for a High-Potassium Diet   Eat fruits and vegetables in their fresh or raw form most often.   Check labels for ingredients that contain potassium. This includes potassium chloride. Add these items to your diet.   Try salt substitutes. Many of these have potassium.   Eat plenty of the following high-potassium foods:   Fruits: apricots (canned and fresh), bananas, cantaloupe, honeydew melon, kiwi, nectarines, oranges, orange juice, pears, dried fruits (apricots, dates, figs, prunes), and prune juice   Vegetables: asparagus, avocado, bamboo shoots, beets, Brussels sprouts, cabbage, celery, chard, okra, potatoes (white and  sweet), pumpkin, rutabaga, spinach (cooked), squash, tomato, tomato sauce, tomato juice, and vegetable juice cocktail   Legumes: black-eyed peas, chickpeas, lentils, lima beans, navy beans, red kidney beans, soybeans, and split peas   Nuts and seeds: almonds, [place] nuts, cashews, peanuts, peanut butter, pecans, pumpkin seeds, sunflower seeds, and walnuts   Breads and cereals: bran and whole-grain products   Others: chocolate, cocoa, coconut milk, milk products, and molasses   Avoid licorice. This includes licorice root and licorice-containing teas. These can rob your body of potassium.  Follow-Up  Make a follow-up appointment for a repeat test. Our staff can help you do this.     [address] [employer] [address] [place], [place] [address]. All rights reserved. This information is not intended as a substitute for professional medical care. Always follow your healthcare professional's instructions.      Nutrition and MyPlate: Dairy  Thedairy group includes foods that are made from milk and are also high in calcium (a nutrient that builds strong bones). If you're lactose-intolerant, special milk products can help. If you're allergic to dairy, be sure to get your calcium from leafy greens (such as mustard or collard greens) and from calcium-fortified foods (such as orange juice and soy products).  Nutrient-Rich Choices   It's best to choose low-fat or nonfatdairy products. Nutrient-rich choices include:   Good old-fashioned milk (low-fat or nonfat). If you don't like the flavor, stir in a little chocolate syrup, or vanilla or almond extract. The nutrients in the milk outweigh the added calories.   Low-fat or nonfat  cheese, cottage cheese, and yogurt.   Foods made with these products, such as cream of broccoli soup made with nonfat milk or quesadillas made with low-fat cheese.  What MakesDairy Less Healthy?   Manydairy products are high in fat. Always look for low-fat or nonfat  varieties. You can ease into this. If you drink whole milk now, make the move to 2% milk first, then to fat-free or low-fat (1%) milk.   Most cheeses are high in fat. If you select a cheese with a strong taste, you may eat less than you would of a milder cheese. Also look for low-fat cheese or cheese made with part skim milk.   Added sugar, such as in ice cream and frozen yogurt, makes dairy products less healthy. Compare food labels to find brands lower in fat and calories.  One Small Change  Drink low-fat or nonfat milk with at least one meal each day. Have a better idea? Write it here:    _____________________________________________________________   1610-9604 [employer] [address] [place], [place] [address]. All rights reserved. This information is not intended as a substitute for professional medical care. Always follow your healthcare professional's instructions.

## 2014-04-24 ENCOUNTER — Telehealth: Payer: Self-pay

## 2014-04-24 DIAGNOSIS — Z720 Tobacco use: Secondary | ICD-10-CM | POA: Insufficient documentation

## 2014-04-24 MED ORDER — PANTOPRAZOLE SODIUM 40 MG PO TBEC
40.0000 mg | DELAYED_RELEASE_TABLET | Freq: Every morning | ORAL | Status: DC
Start: 2014-04-24 — End: 2014-05-24

## 2014-04-24 MED ORDER — AMLODIPINE BESYLATE 10 MG PO TABS
10.0000 mg | ORAL_TABLET | Freq: Every day | ORAL | Status: AC
Start: 2014-04-24 — End: 2014-05-24

## 2014-04-24 MED ORDER — LABETALOL HCL 200 MG PO TABS
200.0000 mg | ORAL_TABLET | Freq: Two times a day (BID) | ORAL | Status: AC
Start: 2014-04-24 — End: 2014-05-24

## 2014-04-24 NOTE — Telephone Encounter (Signed)
Patient has been set up with Dr. Mel Almond on January 20 at 11:30 am for primary care. I have mailed a letter with the appointment information and I also called and left a message on the patient's voicemail with the information as well.

## 2014-07-03 ENCOUNTER — Inpatient Hospital Stay: Payer: Commercial Managed Care - POS | Admitting: Internal Medicine

## 2014-07-03 ENCOUNTER — Emergency Department: Payer: Commercial Managed Care - POS

## 2014-07-03 ENCOUNTER — Inpatient Hospital Stay
Admission: EM | Admit: 2014-07-03 | Discharge: 2014-07-05 | DRG: 440 | Disposition: A | Discharge status: Home / Self Care | Payer: Commercial Managed Care - POS | Attending: Family Medicine | Admitting: Family Medicine

## 2014-07-03 DIAGNOSIS — Z9049 Acquired absence of other specified parts of digestive tract: Secondary | ICD-10-CM | POA: Diagnosis present

## 2014-07-03 DIAGNOSIS — K859 Acute pancreatitis without necrosis or infection, unspecified: Secondary | ICD-10-CM

## 2014-07-03 DIAGNOSIS — Z90411 Acquired partial absence of pancreas: Secondary | ICD-10-CM | POA: Diagnosis present

## 2014-07-03 DIAGNOSIS — F1010 Alcohol abuse, uncomplicated: Secondary | ICD-10-CM | POA: Diagnosis present

## 2014-07-03 DIAGNOSIS — E876 Hypokalemia: Secondary | ICD-10-CM | POA: Diagnosis present

## 2014-07-03 DIAGNOSIS — Z720 Tobacco use: Secondary | ICD-10-CM

## 2014-07-03 DIAGNOSIS — Z886 Allergy status to analgesic agent status: Secondary | ICD-10-CM

## 2014-07-03 DIAGNOSIS — Z885 Allergy status to narcotic agent status: Secondary | ICD-10-CM

## 2014-07-03 DIAGNOSIS — I10 Essential (primary) hypertension: Secondary | ICD-10-CM | POA: Diagnosis present

## 2014-07-03 DIAGNOSIS — K852 Alcohol induced acute pancreatitis without necrosis or infection: Secondary | ICD-10-CM | POA: Diagnosis present

## 2014-07-03 DIAGNOSIS — Z86010 Personal history of colonic polyps: Secondary | ICD-10-CM

## 2014-07-03 DIAGNOSIS — K219 Gastro-esophageal reflux disease without esophagitis: Secondary | ICD-10-CM | POA: Diagnosis present

## 2014-07-03 DIAGNOSIS — F17210 Nicotine dependence, cigarettes, uncomplicated: Secondary | ICD-10-CM | POA: Diagnosis present

## 2014-07-03 DIAGNOSIS — Z86718 Personal history of other venous thrombosis and embolism: Secondary | ICD-10-CM

## 2014-07-03 DIAGNOSIS — J45909 Unspecified asthma, uncomplicated: Secondary | ICD-10-CM | POA: Diagnosis present

## 2014-07-03 LAB — CBC AND DIFFERENTIAL
Basophils %: 0.7 % (ref 0.0–3.0)
Basophils Absolute: 0.1 10*3/uL (ref 0.0–0.3)
Eosinophils %: 1 % (ref 0.0–7.0)
Eosinophils Absolute: 0.1 10*3/uL (ref 0.0–0.8)
Hematocrit: 46.3 % (ref 36.0–48.0)
Hemoglobin: 15.7 gm/dL (ref 12.0–16.0)
Lymphocytes Absolute: 1.5 10*3/uL (ref 0.6–5.1)
Lymphocytes: 15 % (ref 15.0–46.0)
MCH: 36 pg — ABNORMAL HIGH (ref 28–35)
MCHC: 34 gm/dL (ref 32–36)
MCV: 107 fL — ABNORMAL HIGH (ref 80–100)
MPV: 6.3 fL (ref 6.0–10.0)
Monocytes Absolute: 0.7 10*3/uL (ref 0.1–1.7)
Monocytes: 7.2 % (ref 3.0–15.0)
Neutrophils %: 76.1 % (ref 42.0–78.0)
Neutrophils Absolute: 7.7 10*3/uL (ref 1.7–8.6)
PLT CT: 295 10*3/uL (ref 130–440)
RBC: 4.35 10*6/uL (ref 3.80–5.00)
RDW: 13.6 % (ref 11.0–14.0)
WBC: 10.1 10*3/uL (ref 4.0–11.0)

## 2014-07-03 LAB — ECG 12-LEAD
P Wave Axis: 65 deg
P Wave Duration: 119 ms
P-R Interval: 183 ms
Patient Age: 53 years
Q-T Interval(Corrected): 497 ms
Q-T Interval: 478 ms
QRS Axis: 31 deg
QRS Duration: 107 ms
T Axis: 52 deg
Ventricular Rate: 65 /min

## 2014-07-03 LAB — VH URINE DRUG SCREEN - NO CONFIRMATION
Amphetamine: NEGATIVE
Barbiturates: NEGATIVE
Buprenorphine, Urine: NEGATIVE
Cannabinoids: NEGATIVE
Cocaine: NEGATIVE
Opiates: NEGATIVE
Phencyclidine: NEGATIVE
Urine Benzodiazepines: NEGATIVE
Urine Creatinine Random: 178.77 mg/dL
Urine Methadone Screen: NEGATIVE
Urine Oxycodone: NEGATIVE
Urine Specific Gravity: 1.014 (ref 1.001–1.040)
pH, Urine: 6 pH (ref 5.0–8.0)

## 2014-07-03 LAB — VH URINALYSIS WITH MICROSCOPIC AND CULTURE IF INDICATED
Bilirubin, UA: NEGATIVE
Glucose, UA: NEGATIVE mg/dL
Ketones UA: NEGATIVE mg/dL
Leukocyte Esterase, UA: NEGATIVE Leu/uL
Nitrite, UA: NEGATIVE
Protein, UR: NEGATIVE mg/dL
RBC, UA: 1 /hpf (ref 0–5)
Squam Epithel, UA: 1 /hpf (ref 0–2)
Urine Specific Gravity: 1.017 (ref 1.001–1.040)
Urobilinogen, UA: NORMAL mg/dL
WBC, UA: 3 /hpf (ref 0–4)
pH, Urine: 6 pH (ref 5.0–8.0)

## 2014-07-03 LAB — COMPREHENSIVE METABOLIC PANEL
ALT: 56 U/L — ABNORMAL HIGH (ref 0–55)
AST (SGOT): 57 U/L — ABNORMAL HIGH (ref 10–42)
Albumin/Globulin Ratio: 1.1 Ratio (ref 0.70–1.50)
Albumin: 3.3 gm/dL — ABNORMAL LOW (ref 3.5–5.0)
Alkaline Phosphatase: 142 U/L (ref 40–145)
Anion Gap: 14.9 mMol/L (ref 7.0–18.0)
BUN / Creatinine Ratio: 7.9 Ratio — ABNORMAL LOW (ref 10.0–30.0)
BUN: 6 mg/dL — ABNORMAL LOW (ref 7–22)
Bilirubin, Total: 1 mg/dL (ref 0.1–1.2)
CO2: 24.1 mMol/L (ref 20.0–30.0)
Calcium: 8.6 mg/dL (ref 8.5–10.5)
Chloride: 105 mMol/L (ref 98–110)
Creatinine: 0.76 mg/dL (ref 0.60–1.20)
EGFR: >60 mL/min/{1.73_m2}
Globulin: 3 gm/dL (ref 2.0–4.0)
Glucose: 115 mg/dL — ABNORMAL HIGH (ref 70–99)
Osmolality Calc: 280 mOsm/kg (ref 275–300)
Potassium: 3 mMol/L — CL (ref 3.5–5.3)
Protein, Total: 6.3 gm/dL (ref 6.0–8.3)
Sodium: 141 mMol/L (ref 136–147)

## 2014-07-03 LAB — HEPATIC FUNCTION PANEL
ALT: 71 U/L — ABNORMAL HIGH (ref 0–55)
AST (SGOT): 71 U/L — ABNORMAL HIGH (ref 10–42)
Albumin/Globulin Ratio: 1.18 Ratio (ref 0.70–1.50)
Albumin: 3.9 gm/dL (ref 3.5–5.0)
Alkaline Phosphatase: 187 U/L — ABNORMAL HIGH (ref 40–145)
Bilirubin Direct: 0.4 mg/dL — ABNORMAL HIGH (ref 0.0–0.3)
Bilirubin, Total: 1.1 mg/dL (ref 0.1–1.2)
Globulin: 3.3 gm/dL (ref 2.0–4.0)
Protein, Total: 7.2 gm/dL (ref 6.0–8.3)

## 2014-07-03 LAB — MAGNESIUM: Magnesium: 2 mg/dL (ref 1.6–2.6)

## 2014-07-03 LAB — VITAMIN B12 AND FOLATE
Folate: 7 ng/mL (ref 7.0–19.9)
Vitamin B-12: 282 pg/mL (ref 213–816)

## 2014-07-03 LAB — BASIC METABOLIC PANEL
Anion Gap: 16.2 mMol/L (ref 7.0–18.0)
BUN / Creatinine Ratio: 5.1 Ratio — ABNORMAL LOW (ref 10.0–30.0)
BUN: 4 mg/dL — ABNORMAL LOW (ref 7–22)
CO2: 26.9 mMol/L (ref 20.0–30.0)
Calcium: 9.5 mg/dL (ref 8.5–10.5)
Chloride: 102 mMol/L (ref 98–110)
Creatinine: 0.78 mg/dL (ref 0.60–1.20)
EGFR: >60 mL/min/{1.73_m2}
Glucose: 125 mg/dL — ABNORMAL HIGH (ref 70–99)
Osmolality Calc: 281 mOsm/kg (ref 275–300)
Potassium: 3.1 mMol/L — ABNORMAL LOW (ref 3.5–5.3)
Sodium: 142 mMol/L (ref 136–147)

## 2014-07-03 LAB — LIPASE: Lipase: 2124 U/L — ABNORMAL HIGH (ref 8–78)

## 2014-07-03 LAB — VH DEXTROSE STICK GLUCOSE: Glucose POCT: 119 mg/dL — ABNORMAL HIGH (ref 70–99)

## 2014-07-03 MED ORDER — POTASSIUM CHLORIDE CRYS ER 20 MEQ PO TBCR
40.0000 meq | EXTENDED_RELEASE_TABLET | Freq: Once | ORAL | Status: AC
Start: 2014-07-03 — End: 2014-07-03
  Administered 2014-07-03: 40 meq via ORAL
  Filled 2014-07-03 (×2): qty 2

## 2014-07-03 MED ORDER — ONDANSETRON HCL 4 MG/2ML IJ SOLN
4.0000 mg | Freq: Four times a day (QID) | INTRAMUSCULAR | Status: DC | PRN
Start: 2014-07-03 — End: 2014-07-05
  Administered 2014-07-03: 4 mg via INTRAVENOUS
  Filled 2014-07-03: qty 2

## 2014-07-03 MED ORDER — PROMETHAZINE HCL 25 MG/ML IJ SOLN
INTRAMUSCULAR | Status: AC
Start: 2014-07-03 — End: ?
  Filled 2014-07-03: qty 1

## 2014-07-03 MED ORDER — SODIUM CHLORIDE 0.9 % IJ SOLN
0.4000 mg | INTRAMUSCULAR | Status: DC | PRN
Start: 2014-07-03 — End: 2014-07-05

## 2014-07-03 MED ORDER — VH HYDROMORPHONE HCL PF 1 MG/ML CARPUJECT
0.4000 mg | INTRAMUSCULAR | Status: DC | PRN
Start: 2014-07-03 — End: 2014-07-05
  Administered 2014-07-03 – 2014-07-05 (×6): 0.4 mg via INTRAVENOUS
  Filled 2014-07-03 (×5): qty 1

## 2014-07-03 MED ORDER — AMLODIPINE BESYLATE 5 MG PO TABS
10.0000 mg | ORAL_TABLET | Freq: Every day | ORAL | Status: DC
Start: 2014-07-04 — End: 2014-07-05
  Administered 2014-07-04: 10 mg via ORAL
  Filled 2014-07-03 (×2): qty 2

## 2014-07-03 MED ORDER — VH HYDROMORPHONE HCL PF 1 MG/ML CARPUJECT
1.0000 mg | Freq: Once | INTRAMUSCULAR | Status: AC
Start: 2014-07-03 — End: 2014-07-03
  Administered 2014-07-03: 1 mg via INTRAVENOUS

## 2014-07-03 MED ORDER — PROMETHAZINE HCL 25 MG/ML IJ SOLN
12.5000 mg | Freq: Once | INTRAMUSCULAR | Status: AC
Start: 2014-07-03 — End: 2014-07-03
  Administered 2014-07-03: 12.5 mg via INTRAVENOUS

## 2014-07-03 MED ORDER — CEROVITE ADVANCED FORMULA PO TABS
1.0000 | ORAL_TABLET | Freq: Every day | ORAL | Status: DC
Start: 2014-07-04 — End: 2014-07-05
  Administered 2014-07-05: 1 via ORAL
  Filled 2014-07-03 (×2): qty 1

## 2014-07-03 MED ORDER — LABETALOL HCL 200 MG PO TABS
200.0000 mg | ORAL_TABLET | Freq: Two times a day (BID) | ORAL | Status: DC
Start: 2014-07-03 — End: 2014-07-05
  Administered 2014-07-03 – 2014-07-04 (×3): 200 mg via ORAL
  Filled 2014-07-03 (×5): qty 1

## 2014-07-03 MED ORDER — PROMETHAZINE HCL 25 MG/ML IJ SOLN
12.5000 mg | Freq: Four times a day (QID) | INTRAMUSCULAR | Status: DC | PRN
Start: 2014-07-03 — End: 2014-07-05

## 2014-07-03 MED ORDER — ENOXAPARIN SODIUM 40 MG/0.4ML SC SOLN
40.0000 mg | SUBCUTANEOUS | Status: DC
Start: 2014-07-03 — End: 2014-07-05
  Filled 2014-07-03 (×3): qty 0.4

## 2014-07-03 MED ORDER — VH HYDROMORPHONE HCL 1 MG/ML (NARRATOR)
INTRAMUSCULAR | Status: AC
Start: 2014-07-03 — End: ?
  Filled 2014-07-03: qty 1

## 2014-07-03 MED ORDER — ONDANSETRON HCL 4 MG/2ML IJ SOLN
INTRAMUSCULAR | Status: AC
Start: 2014-07-03 — End: ?
  Filled 2014-07-03: qty 2

## 2014-07-03 MED ORDER — IBUPROFEN 400 MG PO TABS
400.0000 mg | ORAL_TABLET | Freq: Four times a day (QID) | ORAL | Status: DC | PRN
Start: 2014-07-03 — End: 2014-07-05

## 2014-07-03 MED ORDER — ACETAMINOPHEN 325 MG PO TABS
650.0000 mg | ORAL_TABLET | ORAL | Status: DC | PRN
Start: 2014-07-03 — End: 2014-07-05

## 2014-07-03 MED ORDER — INSULIN ASPART 100 UNIT/ML SC SOPN
1.0000 [IU] | PEN_INJECTOR | Freq: Four times a day (QID) | SUBCUTANEOUS | Status: DC
Start: 2014-07-03 — End: 2014-07-05

## 2014-07-03 MED ORDER — SODIUM CHLORIDE 0.9 % IJ SOLN
3.0000 mL | Freq: Three times a day (TID) | INTRAMUSCULAR | Status: DC
Start: 2014-07-03 — End: 2014-07-05
  Administered 2014-07-03 – 2014-07-05 (×3): 3 mL via INTRAVENOUS

## 2014-07-03 MED ORDER — POTASSIUM CHLORIDE CRYS ER 20 MEQ PO TBCR
40.0000 meq | EXTENDED_RELEASE_TABLET | Freq: Once | ORAL | Status: AC
Start: 2014-07-03 — End: 2014-07-03
  Administered 2014-07-03: 40 meq via ORAL
  Filled 2014-07-03: qty 2

## 2014-07-03 MED ORDER — ONDANSETRON HCL 4 MG/2ML IJ SOLN
4.0000 mg | Freq: Three times a day (TID) | INTRAMUSCULAR | Status: DC | PRN
Start: 2014-07-03 — End: 2014-07-03
  Administered 2014-07-03: 4 mg via INTRAVENOUS

## 2014-07-03 MED ORDER — DEXTROSE 50 % IV SOLN
25.0000 mL | INTRAVENOUS | Status: DC | PRN
Start: 2014-07-03 — End: 2014-07-05

## 2014-07-03 MED ORDER — ONDANSETRON HCL 4 MG/2ML IJ SOLN
4.0000 mg | Freq: Once | INTRAMUSCULAR | Status: AC
Start: 2014-07-03 — End: 2014-07-03
  Administered 2014-07-03: 4 mg via INTRAVENOUS

## 2014-07-03 MED ORDER — SODIUM CHLORIDE 0.9 % IV BOLUS
1000.0000 mL | Freq: Once | INTRAVENOUS | Status: AC
Start: 2014-07-03 — End: 2014-07-03
  Administered 2014-07-03: 1000 mL via INTRAVENOUS

## 2014-07-03 MED ORDER — PANTOPRAZOLE SODIUM 40 MG PO TBEC
40.0000 mg | DELAYED_RELEASE_TABLET | Freq: Every day | ORAL | Status: DC
Start: 2014-07-04 — End: 2014-07-05
  Administered 2014-07-04 – 2014-07-05 (×2): 40 mg via ORAL
  Filled 2014-07-03 (×2): qty 1

## 2014-07-03 MED ORDER — KCL IN DEXTROSE-NACL 20-5-0.9 MEQ/L-%-% IV SOLN
INTRAVENOUS | Status: DC
Start: 2014-07-03 — End: 2014-07-05
  Filled 2014-07-03 (×8): qty 1000

## 2014-07-03 MED ORDER — LORAZEPAM 2 MG/ML IJ SOLN
2.0000 mg | INTRAMUSCULAR | Status: DC | PRN
Start: 2014-07-03 — End: 2014-07-05

## 2014-07-03 NOTE — Discharge Instructions (Addendum)
Disposition:  home  Diet: Cardiac Diet  Activity: As tolerated  Discharge Code Status: Full Code  Pancreatitis  The pancreas is an organ in the left upper abdomen that secretes digestive juices into the stomach. Pancreatitis is an inflammation of the pancreas. This may occur for various causes including heavy alcohol use, gall stone blockage of the outflow duct from the pancreas, certain medicines and viral illness. Sometimes the cause of pancreatitis cannot be found.  Moderate to severe illness requires being treated in the hospital. Milder attacks of pancreatitis can be treated at home.  Home Care:  1) Absolutely NO ALCOHOL.  2) Rest in bed or sit up in a chair until you feel better.  3) Eat small more frequent meals rather than a few large meals each day.  4) Follow a high protein, high carbohydrate, low fat diet.  5) If you were given medicine for pain or vomiting, take it as prescribed.  Follow Up  with your doctor or as directed by our staff for further evaluation.  Get Prompt Medical Attention  if any of the following occur:  -- Continued or worsening pain in the abdomen  -- Repeated vomiting: unable to keep down liquids  -- Dizziness, weakness or fainting  -- Vomiting blood or blood in the stool (black or red color)  -- Fever over 100.4 F (38.0 C)  -- Severe muscle cramps or seizure  -- Trouble breathing or fast breathing (over 25 breaths/minute)  -- Jaundice (yellow color of the skin or eyes)   2000-2015 [employer] [address] [place], [place] [address]. All rights reserved. This information is not intended as a substitute for professional medical care. Always follow your healthcare professional's instructions.          Pancreatitis  The pancreas is an organ in the left upper abdomen that secretes digestive juices into the stomach. Pancreatitis is an inflammation of the pancreas. This may occur for various causes including heavy alcohol use, gall stone blockage of the outflow duct  from the pancreas, certain medicines and viral illness. Sometimes the cause of pancreatitis cannot be found.  Moderate to severe illness requires being treated in the hospital. Milder attacks of pancreatitis can be treated at home.  Home Care:  1) Absolutely NO ALCOHOL.  2) Rest in bed or sit up in a chair until you feel better.  3) Eat small more frequent meals rather than a few large meals each day.  4) Follow a high protein, high carbohydrate, low fat diet.  5) If you were given medicine for pain or vomiting, take it as prescribed.  Follow Up  with your doctor or as directed by our staff for further evaluation.  Get Prompt Medical Attention  if any of the following occur:  -- Continued or worsening pain in the abdomen  -- Repeated vomiting: unable to keep down liquids  -- Dizziness, weakness or fainting  -- Vomiting blood or blood in the stool (black or red color)  -- Fever over 100.4 F (38.0 C)  -- Severe muscle cramps or seizure  -- Trouble breathing or fast breathing (over 25 breaths/minute)  -- Jaundice (yellow color of the skin or eyes)   2000-2015 [employer] [address] [place], [place] [address]. All rights reserved. This information is not intended as a substitute for professional medical care. Always follow your healthcare professional's instructions.          Discharge Instructions for Acute Pancreatitis  You  have been diagnosed with acute pancreatitis. Your pancreas is inflamed or swollen. The pancreas is an organ that produces chemicals and hormones that help you digest food and use sugar for energy. Gallstones are one of the most common causes of this condition. These hard stones form in the gallbladder, which shares a passage with the pancreas into the small intestine. If gallstones block this passage, fluid can't escape the pancreas. The fluid backs up and causes inflammation.  Immediate home care   Find someone to drive you to appointments. Acute pancreatitis is a serious  condition, and you should never drive if you are experiencing symptoms.   Stop drinking if your illness was caused by alcohol.   Ask your doctor about alcohol abuse programs and support groups such as Alcoholics Anonymous.   Ask your doctor about prescription medications that can help you stop drinking.   Take your medications exactly as directed. Don't skip doses.   Eat a low-fat diet. Ask your doctor for menus and other diet information.   Learn to take your own pulse. Keep a record of your results. Ask your doctor which readings mean that you need medical attention.  Ongoingcare   Tell your doctor about any medications you are taking. Some can cause acute pancreatitis.   Tell your doctor if you lose weight without dieting.   Watch for symptoms that indicate your pancreatitis has returned. These symptoms include abdominal pain, nausea and vomiting, and fever.   Keep all follow-up appointments with your doctor. Delayed complications are common with this illness.  Follow-up  Make a follow-up appointment as directed by our staff.     [address] [employer] [address], [place], [place] [address]. All rights reserved. This information is not intended as a substitute for professional medical care. Always follow your healthcare professional's instructions.

## 2014-07-03 NOTE — Progress Notes (Signed)
Assumed care at 1900. Critical K 3.0 called at 2025, Dr. Theodoro Grist on call, paged. Dr. Theodoro Grist called back at 3032, ordered 1x dose of potassium in 2 hours.  Order placed. Will continue to monitor. Patient c/o pain and nausea. Pain med not available, medicated for nausea. Denies needs at this time. Call bell in reach.

## 2014-07-03 NOTE — ED Notes (Signed)
Patient resting quietly in bed. Upon reassessment, patient requested more pain medication and nausea medication. Zofran and Dilaudid given per admission orders prior to ordering transport. No change in patient assessment at this time. No other needs assessed at this time. Patient is aware of room assignment. Transport to be ordered.

## 2014-07-03 NOTE — H&P (Signed)
HISTORY AND PHYSICAL - [facility]    Date Time: 07/03/2014 5:21 PM  Patient Name: [name]  Attending Physician: Myna Hidalgo, MD  Primary Care Physician: Cain Saupe, MD    CC:   Chief Complaint   Patient presents with   . Flank Pain       Assessment:                                                                                       [facility]   Principal Problem:    Acute alcoholic pancreatitis  Active Problems:    Essential hypertension    Hypokalemia    Tobacco use    ETOH abuse    Asthma      Plan:                                                                                                   [facility]   Acute on chronic alcoholic pancreatitis  Admit to medical floor  Continue IV fluid hydration is normal saline  Continue pain control  Keep her nothing by mouth for bowel rest   Monitor lipase in a.m.   Consider GI consultation if pain worsens  Hypertension   Continue home medications with holding parameters  Hypokalemia  Replete potassium with IV fluid  Potassium chloride 40 mEq once  Tobacco use  Smoking cessation counseling given  Declines nicotine replacement therapy  ETOH abuse   Monitor on CIWA protocol  Asthma was no exacerbation  Continue bronchodilators as needed  History of DVT status post IVC filter   Continue Lovenox for DVT prophylaxis    Service status: Inpatient: risk of morbidity and mortality and risk of progressive disease  Anticipate discharge in:2 days.  History of Presenting Illness:                                                          [facility]     [name] is a 54 y.o. female was significant past medical history of hypertension and pancreatitis who presents to the hospital with complaints of abdominal pain of one-day duration. The pain is localized to the left upper quadrant was radiation to the back associated with nausea and vomiting. The vomitus is of ingested material nonprojectile. She also  complains generalized body weakness but denies any fever nor chills. She admits having alcohol drinks intermittently but she attributes the pain continues to heavy lifting. She smokes 3 cigarettes a day. She denies recreational drug use. Denies itching.  Past Medical History:  [facility]     Past Medical History   Diagnosis Date   . Hypertension    . Asthma without status asthmaticus    . Gastroesophageal reflux disease    . Pancreatitis    . DVT (deep venous thrombosis)    . S/P IVC filter    . Clostridium difficile colitis    . H/O ETOH abuse      sober 2 years until 03/2014       Past Surgical History:                                                                       [facility]     Past Surgical History   Procedure Laterality Date   . Colonoscopy  12/26/2012     Procedure: COLONOSCOPY;  Surgeon: Gwenith Spitz, MD;  Location: [facility];  Service: Gastroenterology;  Laterality: N/A;   . Colonoscopy, polypectomy  12/26/2012     Procedure: COLONOSCOPY, POLYPECTOMY;  Surgeon: Gwenith Spitz, MD;  Location: [facility];  Service: Gastroenterology;  Laterality: N/A;   . Cholecystectomy     . Pancreatectomy  01/2011     partial       Family History:                                                                                    [facility]     History reviewed. No pertinent family history.    Social History:                                                                                    [facility]     History     Social History   . Marital Status: Divorced     Spouse Name: N/A   . Number of Children: N/A   . Years of Education: N/A     Occupational History   . Not on file.     Social History Main Topics   . Smoking status: Current Every Day Smoker -- 0.50 packs/day     Types: Cigarettes   . Smokeless tobacco: Never Used   . Alcohol Use: Yes      Comment: occasional   . Drug Use: No   . Sexual  Activity: Not on file     Other Topics Concern   . Not on file     Social History Narrative       Code Status: Prior  Allergies:  [facility]     Allergies   Allergen Reactions   . Percocet [Oxycodone-Acetaminophen]        Medications:                                                                                       [facility]     No current facility-administered medications on file prior to encounter.     No current outpatient prescriptions on file prior to encounter.       Review Of Systems:                                                                          [facility]     1. Constitutional:  Denies fever, but complains of fatigue.    2. Eyes:  Denies blurry vision or double vision.    3. ENT:  Denies sore throat or trouble swallowing.    4. CV:  Denies chest pain or palpitations.    5. Respiratory:  Denies SOB or cough.    6. GI:  See history of present illness.    7. GU: See history of present illness.    8. MS: Denies new muscle or joint pain.    9. Skin: Denies rash.    10. Neuro: Denies any new areas of numbness or weakness.    11. Psych: Denies being depressed or anxious.    12. Heme: Denies any bleeding or recent anemia.    13. Endocrine: Denies heat or cold intolerance. Denies excessive thirst or urination.    14. Immune:  Denies seasonal allergies or asthma exacerbation.    Physical Exam:                                                                                   [facility]     Patient Vitals for the past 24 hrs:   BP Temp Pulse Resp SpO2 Height Weight   07/03/14 1256 (!) 162/117 mmHg - - - - - -   07/03/14 1214 (!) 163/105 mmHg 97.7 F (36.5 C) 95 16 97 % 1.651 m (5\' 5" ) 84.6 kg (186 lb 8.2 oz)     Body mass index is 31.04 kg/(m^2).  No intake or output data in the 24 hours ending 07/03/14 1721    General: awake, alert, oriented x 3; in mild pain distress.  HEENT:  perrla, eomi, sclera anicteric  oropharynx clear without lesions, mucous membranes moist  Neck: supple, no lymphadenopathy, no thyromegaly, no JVD, no carotid bruits  Cardiovascular: regular rate and rhythm, no murmurs, rubs or  gallops  Lungs: clear to auscultation bilaterally, without wheezing, rhonchi, or rales  Abdomen: Midline laparotomy scar presumptively not having soft, left upper quadrant tenderness, non-distended; no palpable masses, no hepatosplenomegaly, normoactive bowel sounds, no rebound or guarding  Extremities: no clubbing, cyanosis, or edema  Neuro: cranial nerves grossly intact, strength 5/5 in upper and lower extremities, sensation intact,   Skin: no rashes or lesions noted    Labs and Imaging:                                                                              [facility]     Results     Procedure Component Value Units Date/Time    Urinalysis w Microscopic and Culture if Indicated [[id number]] Collected:  07/03/14 1712    Specimen Information:  Urine, Random Updated:  07/03/14 1719    Vitamin B12 and Folate [[id number]] Collected:  07/03/14 1253    Specimen Information:  Plasma Updated:  07/03/14 1704     Folate 7.0 ng/mL      Vitamin B-12 282 pg/mL     Lipase [[id number]]  (Abnormal) Collected:  07/03/14 1253    Specimen Information:  Plasma Updated:  07/03/14 1643     Lipase 2124 (H) U/L     LFTs [[id number]]  (Abnormal) Collected:  07/03/14 1253    Specimen Information:  Plasma Updated:  07/03/14 1512     Protein, Total 7.2 gm/dL      Albumin 3.9 gm/dL      Alkaline Phosphatase 187 (H) U/L      ALT 71 (H) U/L      AST (SGOT) 71 (H) U/L      Bilirubin, Total 1.1 mg/dL      Bilirubin, Direct 0.4 (H) mg/dL      Albumin/Globulin Ratio 1.18 Ratio      Globulin 3.3 gm/dL     Basic Metabolic Panel [[id number]]  (Abnormal) Collected:  07/03/14 1253    Specimen Information:  Plasma Updated:  07/03/14 1322     Sodium 142 mMol/L      Potassium 3.1 (L) mMol/L      Chloride 102 mMol/L      CO2  26.9 mMol/L      CALCIUM 9.5 mg/dL      Glucose 244 (H) mg/dL      Creatinine 0.10 mg/dL      BUN 4 (L) mg/dL      Anion Gap 27.2 mMol/L      BUN/Creatinine Ratio 5.1 (L) Ratio      EGFR >60 mL/min/1.30m2      Osmolality Calc 281 mOsm/kg     CBC and differential [[id number]]  (Abnormal) Collected:  07/03/14 1253    Specimen Information:  Blood / Blood Updated:  07/03/14 1319     WBC 10.1 K/cmm      RBC 4.35 M/cmm      Hemoglobin 15.7 gm/dL      Hematocrit 74.2 %      MCV 107 (H) fL      MCH 36 (H) pg      MCHC 34 gm/dL      RDW 59.5 %      PLT CT 295 K/cmm  MPV 6.3 fL      NEUTROPHIL % 76.1 %      Lymphocytes 15.0 %      Monocytes 7.2 %      Eosinophils % 1.0 %      Basophils % 0.7 %      Neutrophils Absolute 7.7 K/cmm      Lymphocytes Absolute 1.5 K/cmm      Monocytes Absolute 0.7 K/cmm      Eosinophils Absolute 0.1 K/cmm      BASO Absolute 0.1 K/cmm      RBC Morphology RBC Morphology Reviewed      Macrocytic 2+      Polychromasia 1+           XR ABDOMEN 2 VIEW WITH CHEST 1 VIEW   Final Result   1.  Bibasilar atelectasis versus inflammation.   2.  No acute abnormality identified in the abdomen.      ReadingStation:[id number]            Signed by: Cala Bradford, MD    [facility]  [address]  [place], [place] [phone]  Pager [phone]  JX:BJYNWG, Victor, MD

## 2014-07-03 NOTE — ED Notes (Signed)
Pt reports abd pain, more defined on the left and nausea/vomiting/diarrhea since 1 am

## 2014-07-03 NOTE — ED Provider Notes (Signed)
Physician/Midlevel provider first contact with patient: 07/03/14 1401         History     Chief Complaint   Patient presents with   . Flank Pain     HPI Comments: Patient has a history of pancreatitis and had surgery at [facility] 3 years ago. Patient said that she was lifting boxes yesterday and may have hit against her abdomen. Patient's had previous cholecystectomy for she also has noted a hernia recently. She denies any chest pain. No cough. No headache. No weight change. No dysuria. She denies any blood in the stools.    Patient is a 54 y.o. female presenting with flank pain. The history is provided by the patient.   Flank Pain  This is a new problem. The current episode started today. The problem occurs constantly. The problem has been unchanged. Associated symptoms include abdominal pain, chills, nausea and vomiting. Pertinent negatives include no headaches. Nothing aggravates the symptoms. She has tried nothing for the symptoms. The treatment provided no relief.            Past Medical History   Diagnosis Date   . Hypertension    . Asthma without status asthmaticus    . Gastroesophageal reflux disease    . Pancreatitis    . DVT (deep venous thrombosis)    . S/P IVC filter    . Clostridium difficile colitis    . H/O ETOH abuse      sober 2 years until 03/2014       Past Surgical History   Procedure Laterality Date   . Colonoscopy  12/26/2012     Procedure: COLONOSCOPY;  Surgeon: Gwenith Spitz, MD;  Location: [facility];  Service: Gastroenterology;  Laterality: N/A;   . Colonoscopy, polypectomy  12/26/2012     Procedure: COLONOSCOPY, POLYPECTOMY;  Surgeon: Gwenith Spitz, MD;  Location: [facility];  Service: Gastroenterology;  Laterality: N/A;   . Cholecystectomy     . Pancreatectomy  01/2011     partial       History reviewed. No pertinent family history.    Social  History   Substance Use Topics   . Smoking status: Current Every Day Smoker -- 0.50 packs/day     Types: Cigarettes   .  Smokeless tobacco: Never Used   . Alcohol Use: Yes      Comment: occasional       .     Allergies   Allergen Reactions   . Percocet [Oxycodone-Acetaminophen] Other (See Comments)     Makes her "loopy."       Home Medications     Last Medication Reconciliation Action:  Complete Elby Showers, RN 07/03/2014  6:14 PM                  amLODIPine (NORVASC) 10 MG tablet     Take 10 mg by mouth daily.     diphenhydrAMINE-acetaminophen (TYLENOL PM) 25-500 MG Tab     Take 2 tablets by mouth nightly as needed.     ibuprofen (ADVIL,MOTRIN) 200 MG tablet     Take 1,200 mg by mouth as needed for Pain.     labetalol (NORMODYNE) 100 MG tablet     Take 200 mg by mouth 2 (two) times daily. Take two 100 mg tablets for a total dose of 200 mg every 12 hours       Multiple Vitamins-Minerals (MULTIVITAMIN WITH MINERALS) tablet     Take 1 tablet by mouth  daily.     pantoprazole (PROTONIX) 40 MG tablet     Take 40 mg by mouth daily.           Review of Systems   Constitutional: Positive for chills. Negative for unexpected weight change.   Gastrointestinal: Positive for nausea, vomiting and abdominal pain.   Genitourinary: Positive for flank pain.   Neurological: Negative for headaches.   All other systems reviewed and are negative.      Physical Exam    BP: (!) 163/105 mmHg, Heart Rate: 95, Temp: 97.7 F (36.5 C), Resp Rate: 16, SpO2: 97 %, Weight: 84.6 kg    Physical Exam   Constitutional: She appears well-developed and well-nourished. She appears distressed.   HENT:   Head: Normocephalic and atraumatic.   Right Ear: External ear normal.   Left Ear: External ear normal.   Nose: Nose normal.   Mouth/Throat: Oropharynx is clear and moist. No oropharyngeal exudate.   Eyes: Conjunctivae and EOM are normal. Right eye exhibits no discharge. Left eye exhibits no discharge. No scleral icterus.   Neck: Neck supple. No tracheal deviation present. No thyromegaly present.   Cardiovascular: Normal rate and regular rhythm.  Exam reveals no gallop and  no friction rub.    No murmur heard.  Pulmonary/Chest: Effort normal and breath sounds normal. No stridor. No respiratory distress. She has no wheezes. She has no rales. She exhibits no tenderness.   Abdominal: Bowel sounds are normal. She exhibits no distension and no mass. There is no tenderness. There is no rebound and no guarding.   Patient does have an abdominal wall hernia just to the left of the midline. She is slightly tender over this area. She has tenderness also toward the left flank area. There is no discoloration of the abdomen. Bowel sounds are present.   Musculoskeletal: She exhibits no edema or tenderness.   Lymphadenopathy:     She has no cervical adenopathy.   Neurological: She is alert. She exhibits normal muscle tone. Coordination normal.   Skin: No rash noted. She is not diaphoretic. No erythema. No pallor.   Psychiatric: She has a normal mood and affect. Her behavior is normal. Thought content normal.   Nursing note and vitals reviewed.        MDM and ED Course     ED Medication Orders     Start Ordered     Status Ordering Provider    07/03/14 1442 07/03/14 1441  ondansetron (ZOFRAN) injection 4 mg   Once in ED     Route: Intravenous  Ordered Dose: 4 mg     Last MAR action:  Given Seng Larch C    07/03/14 1409 07/03/14 1408  HYDROmorphone (DILAUDID) injection 1 mg   Once in ED     Route: Intravenous  Ordered Dose: 1 mg     Last MAR action:  Given Lanetta Figuero C    07/03/14 1409 07/03/14 1408  promethazine (PHENERGAN) injection 12.5 mg   Once in ED     Route: Intravenous  Ordered Dose: 12.5 mg     Last MAR action:  Given Jakai Onofre C    07/03/14 1409 07/03/14 1408  sodium chloride 0.9 % bolus 1,000 mL   Once in ED     Route: Intravenous  Ordered Dose: 1,000 mL     Last MAR action:  New Bag May Ozment, Kristine Garbe              MDM  Number of Diagnoses  or Management Options  Acute recurrent pancreatitis:   Diagnosis management comments: The patient presents to the Emergency  Department with abdominal pain. Treatment has been initiated in the ER, but the patient has not had significant improvement in symptoms, appears ill enough and/or has illness/findings/co-morbidities that make admission for IV medications, possible surgical consult, and further management the most appropriate disposition. Differential diagnosis has included but is not limited to appendicitis, gall bladder disease, bowel obstruction, colitis, gastroenteritis, urinary tract obstruction, AAA, pancreatitic and hepatitis.  Diagnostic impression and plan were discussed and agreed upon with the patient and/or family.  Results of lab/radiology tests were reviewed and discussed with the patient and/or family. All questions were answered and concerns addressed.  Appropriate consultation was made for admission and further treatment of this patient.                   Amount and/or Complexity of Data Reviewed  Clinical lab tests: reviewed  Tests in the radiology section of CPT: reviewed  Tests in the medicine section of CPT: reviewed  Discussion of test results with the performing providers: yes           Procedures    Clinical Impression & Disposition     Clinical Impression  Final diagnoses:   Acute recurrent pancreatitis        ED Disposition     Admit Admitting Physician: Cala Bradford [[id number]]  Diagnosis: Acute alcoholic pancreatitis [365860]  Estimated Length of Stay: > or = to 2 midnights  Tentative Discharge Plan?: Home or Self Care [1]  Patient Class: Inpatient [101]             Current Discharge Medication List                      Myna Hidalgo, MD  07/03/14 Paulo Fruit

## 2014-07-03 NOTE — Progress Notes (Signed)
Patient seen and examined at the bedside. She had elevated lipase level with signs of acute on chronic pancreatitis. Patient requested to be discharged to home from the emergency room. I explained to her risk of going home was elevated lipase level. Patient is other month to go home with pain medications. I inform her of the risk of each options. I discussed with emergency room attending Dr Effie Shy.

## 2014-07-04 LAB — CBC AND DIFFERENTIAL
Basophils %: 0.5 % (ref 0.0–3.0)
Basophils Absolute: 0 10*3/uL (ref 0.0–0.3)
Eosinophils %: 2.1 % (ref 0.0–7.0)
Eosinophils Absolute: 0.2 10*3/uL (ref 0.0–0.8)
Hematocrit: 37.5 % (ref 36.0–48.0)
Hemoglobin: 12.6 gm/dL (ref 12.0–16.0)
Lymphocytes Absolute: 1.3 10*3/uL (ref 0.6–5.1)
Lymphocytes: 15.7 % (ref 15.0–46.0)
MCH: 37 pg — ABNORMAL HIGH (ref 28–35)
MCHC: 34 gm/dL (ref 32–36)
MCV: 109 fL — ABNORMAL HIGH (ref 80–100)
MPV: 7 fL (ref 6.0–10.0)
Monocytes Absolute: 0.9 10*3/uL (ref 0.1–1.7)
Monocytes: 10.2 % (ref 3.0–15.0)
Neutrophils %: 71.5 % (ref 42.0–78.0)
Neutrophils Absolute: 6.1 10*3/uL (ref 1.7–8.6)
PLT CT: 237 10*3/uL (ref 130–440)
RBC: 3.43 10*6/uL — ABNORMAL LOW (ref 3.80–5.00)
RDW: 13.4 % (ref 11.0–14.0)
WBC: 8.5 10*3/uL (ref 4.0–11.0)

## 2014-07-04 LAB — VH DEXTROSE STICK GLUCOSE
Glucose POCT: 134 mg/dL — ABNORMAL HIGH (ref 70–99)
Glucose POCT: 153 mg/dL — ABNORMAL HIGH (ref 70–99)
Glucose POCT: 106 mg/dL — ABNORMAL HIGH (ref 70–99)
Glucose POCT: 103 mg/dL — ABNORMAL HIGH (ref 70–99)

## 2014-07-04 LAB — COMPREHENSIVE METABOLIC PANEL
ALT: 47 U/L (ref 0–55)
AST (SGOT): 43 U/L — ABNORMAL HIGH (ref 10–42)
Albumin/Globulin Ratio: 1.03 Ratio (ref 0.70–1.50)
Albumin: 3 gm/dL — ABNORMAL LOW (ref 3.5–5.0)
Alkaline Phosphatase: 126 U/L (ref 40–145)
Anion Gap: 9.8 mMol/L (ref 7.0–18.0)
BUN / Creatinine Ratio: 8.5 Ratio — ABNORMAL LOW (ref 10.0–30.0)
BUN: 7 mg/dL (ref 7–22)
Bilirubin, Total: 0.9 mg/dL (ref 0.1–1.2)
CO2: 24.1 mMol/L (ref 20.0–30.0)
Calcium: 8.3 mg/dL — ABNORMAL LOW (ref 8.5–10.5)
Chloride: 110 mMol/L (ref 98–110)
Creatinine: 0.82 mg/dL (ref 0.60–1.20)
EGFR: >60 mL/min/{1.73_m2}
Globulin: 2.9 gm/dL (ref 2.0–4.0)
Glucose: 133 mg/dL — ABNORMAL HIGH (ref 70–99)
Osmolality Calc: 279 mOsm/kg (ref 275–300)
Potassium: 3.9 mMol/L (ref 3.5–5.3)
Protein, Total: 5.9 gm/dL — ABNORMAL LOW (ref 6.0–8.3)
Sodium: 140 mMol/L (ref 136–147)

## 2014-07-04 LAB — MAGNESIUM: Magnesium: 1.8 mg/dL (ref 1.6–2.6)

## 2014-07-04 LAB — LIPASE: Lipase: 1508 U/L — ABNORMAL HIGH (ref 8–78)

## 2014-07-04 LAB — PHOSPHORUS: Phosphorus: 2.6 mg/dL (ref 2.3–4.7)

## 2014-07-04 NOTE — Progress Notes (Signed)
Assumed care at 1900. Pt resting in bed with no s/s of distress noted. Denies needs or pain at this time. Educated on importance in preventing blood clots with SCDs. Pt refused SCDs. CIWA assessment complete. Score-0. Call bell within reach. Side rails up X2. Will continue to monitor.

## 2014-07-04 NOTE — Progress Notes (Signed)
Assume care at 0700. Assessment complete, no s/s of distress noted. Pt medicated for abd and upper back pain. C/O slight nausea, but denied needing meds at this time. CIWA 1 this AM. Call bell in reach. Needs met. Will continue to monitor.

## 2014-07-04 NOTE — Progress Notes (Signed)
Pt sleeping on and off throughout shift. Denied any needs at this time. Will continue to monitor.

## 2014-07-04 NOTE — PT Plan of Care Note (Signed)
[facility]  [facility]  [address]  [place] [address]  Department of Rehabilitation Services  [phone]  Kathryn Cosby CSN: [id number]  GI/ENDO/GEN MED 529/529-A    Physical Therapy General Note  Time: 1610    Last seen by a Physical Therapist vs. PTA:     SCREENED FOR NEEDS. PT FULLY INDEPENDENT IN MOBILITY AND GAIT. CONFIRMED WITH RN. RECOMMEND PT CONTINUE AD LIB AMB.    Team Communication: RN    Plan: D/C P.T.    Maryann Alar

## 2014-07-04 NOTE — Progress Notes (Signed)
Pt's O2 sat in upper 80's on RA upon checking it, when pt is cued to deep breathe O2 sats came up to 90%. Placed 2L O2 on pt.

## 2014-07-04 NOTE — Plan of Care (Signed)
Problem: Health Promotion  Goal: Knowledge - disease process  Extent of understanding conveyed about a specific disease process.   Outcome: Progressing  Report pain or nausea. IVF. Monitor VS and labs.     Problem: Safety  Goal: Patient will be free from injury during hospitalization  Outcome: Progressing    Problem: Pain  Goal: Patient's pain/discomfort is manageable  Outcome: Progressing  PRN pain meds.     Problem: Psychosocial and Spiritual Needs  Goal: Demonstrates ability to cope with hospitalization/illness  Outcome: Progressing    Problem: Moderate/High Fall Risk Score >5  Goal: Patient will remain free of falls  Outcome: Progressing    Problem: Altered GI Function  Goal: Fluid and electrolyte balance are achieved/maintained  Outcome: Progressing  Lipase trending down today. IVF.

## 2014-07-04 NOTE — Progress Notes (Signed)
Progress Note - [facility]     Patient Name: [name],[name] LOS: 1 days   Attending Physician: Raul Del, DO   Primary Care Physician: Cain Saupe, MD   Assessment and Plan:                                                            [facility]   Acute on chronic alcoholic pancreatitis  Continue IV fluid hydration is normal saline  We'll continue current pain meds.  Lipase level is improving  We'll try the patient on clear liquids and see how she tolerates that.  Hypertension  Continue home medications with holding parameters  Hypokalemia  Has responded to treatment.  Tobacco use  Smoking cessation counseling given  Declines nicotine replacement therapy  ETOH abuse  Monitor on CIWA protocol  Asthma was no exacerbation  Continue bronchodilators as needed Active Problem List  Principal Problem:    Acute alcoholic pancreatitis  Active Problems:    Essential hypertension    Hypokalemia    Tobacco use    ETOH abuse    Asthma       Subjective and Objective:                         [facility]   CC: Acute alcoholic pancreatitis    [name] is a 54 y.o. female who came in with acute pancreatitis likely due to alcohol abuse.  We'll try her on clear liquids today.     Patient Vitals for the past 12 hrs:   BP Temp Pulse Resp   07/04/14 0846 - - 81 -   07/04/14 0817 137/81 mmHg 98.5 F (36.9 C) 74 18   07/04/14 0319 108/62 mmHg 97.3 F (36.3 C) 66 18   07/03/14 2309 121/64 mmHg 99.2 F (37.3 C) 78 18     Weight Monitoring 04/11/2014 04/12/2014 04/13/2014 04/14/2014 04/23/2014 07/03/2014 07/03/2014   Height 167.6 cm - - - 172.7 cm 165.1 cm 165.1 cm   Weight 81 kg 81.557 kg 85 kg 85.6 kg 85.276 kg 84.6 kg 84.1 kg   Weight Method Actual Actual Actual Actual - Actual Actual   BMI (calculated) 28.9 kg/m2 - - - 28.6 kg/m2 31.1 kg/m2 30.9 kg/m2        Review of Systems   Constitutional: Negative for fever and chills.   HENT: Negative for sore throat.     Eyes: Negative for blurred vision and pain.   Respiratory: Negative for cough and shortness of breath.    Cardiovascular: Negative for chest pain and leg swelling.   Gastrointestinal: Positive for abdominal pain. Negative for nausea and vomiting.   Genitourinary: Negative for dysuria.   Musculoskeletal: Negative for myalgias.   Skin: Negative for rash.   Neurological: Negative for dizziness and headaches.    Physical Exam   Constitutional: She is oriented to person, place, and time. She appears well-developed and well-nourished. No distress.   HENT:   Head: Normocephalic and atraumatic.   Eyes: Conjunctivae are normal. No scleral icterus.   Neck: Neck supple. No tracheal deviation present.   Cardiovascular: Normal rate and regular rhythm.    Pulmonary/Chest: Effort normal and breath sounds normal. She has no wheezes.   Abdominal: Soft. Bowel sounds are normal.  There is tenderness. There is guarding.   Musculoskeletal: Normal range of motion. She exhibits no edema.   Neurological: She is alert and oriented to person, place, and time.   Skin: Skin is warm and dry.   Psychiatric: Her behavior is normal. Thought content normal.      Labs, Meds, Imaging, Etc. [facility]     Recent Labs  Lab 07/04/14  0721 07/03/14  1253   WBC 8.5 10.1   RBC 3.43* 4.35   HEMOGLOBIN 12.6 15.7   HEMATOCRIT 37.5 46.3   MCV 109* 107*   PLT CT 237 295             Lab Results   Component Value Date    HGBA1CPERCNT 5.3 04/19/2012      Recent Labs  Lab 07/04/14  0721 07/03/14  1906 07/03/14  1253   GLUCOSE 133* 115* 125*   SODIUM 140 141 142   POTASSIUM 3.9 3.0* 3.1*   CHLORIDE 110 105 102   CO2 24.1 24.1 26.9   BUN 7 6* 4*   CREATININE 0.82 0.76 0.78   EGFR >60 >60 >60   CALCIUM 8.3* 8.6 9.5       Recent Labs  Lab 07/04/14  0721 07/03/14  1906   MAGNESIUM 1.8 2.0   PHOSPHORUS 2.6  --    ALBUMIN 3.0* 3.3*   PROTEIN, TOTAL 5.9* 6.3   BILIRUBIN, TOTAL 0.9 1.0   ALKALINE PHOSPHATASE 126 142   ALT 47 56*   AST (SGOT) 43* 57*       Recent  Labs  Lab 07/03/14  1712   SPECIFIC GRAVITY, UR 1.014  1.017   PH, URINE 6.0  6.0   PROTEIN, UR Negative   GLUCOSE, UA Negative   KETONES UA Negative   BILIRUBIN, UA Negative   BLOOD, UA Small*   NITRITE, UA Negative   UROBILINOGEN, UA Normal   LEUKOCYTE ESTERASE, UA Negative   WBC, UA 3   RBC, UA 1   BACTERIA, UA Rare*      Estimated Creatinine Clearance: 84.9 mL/min (based on Cr of 0.82).   Current Facility-Administered Medications   Medication Dose Route Frequency   . amLODIPine  10 mg Oral Daily   . enoxaparin  40 mg Subcutaneous Q24H   . insulin aspart  1-3 Units Subcutaneous TID AC & HS   . labetalol  200 mg Oral BID   . multivitamin  1 tablet Oral Daily   . pantoprazole  40 mg Oral Daily   . sodium chloride (PF)  3 mL Intravenous Q8H    PRN Meds: acetaminophen, dextrose, HYDROmorphone, ibuprofen, LORazepam, naloxone, ondansetron, promethazine.   Marland Kitchen dextrose 5 % and 0.9 % NaCl with KCl 20 mEq 125 mL/hr at 07/04/14 0435      Patient Lines/Drains/Airways Status    Active PICC Line / CVC Line / PIV Line / Drain / Airway / Intraosseous Line / Epidural Line / ART Line / Line / Wound / Pressure Ulcer / NG/OG Tube     Name:   Placement date:   Placement time:   Site:   Days:    Peripheral IV 07/03/14 Right Antecubital  07/03/14   1253   Antecubital   less than 1               Xr Abdomen 2 View With Chest 1 View    07/03/2014   1.  Bibasilar atelectasis versus inflammation. 2.  No acute abnormality identified in the  abdomen.  ReadingStation:[email or web address]     I have spent 25 minutes with the patient discussing the principle problem (listed above), the active hospital problems (listed above), discharge planning issues, the results of laboratory tests and medications (current therapy and side effects).   Greater than 50% of the time was spent counseling the patient and coordinating care with Nursing staff (RN/LPN).              MRN: [id number]           CSN: [id number]  DOB: 05/08/1960   Deryl Ports D Verner Kopischke, DO  07/04/2014 10:51  AM  [facility] Pager  -  [phone]  [facility] Mobile  -  [phone] [facility]  [address]  [place], [place]  [phone]

## 2014-07-05 LAB — COMPREHENSIVE METABOLIC PANEL
ALT: 40 U/L (ref 0–55)
AST (SGOT): 42 U/L (ref 10–42)
Albumin/Globulin Ratio: 0.96 Ratio (ref 0.70–1.50)
Albumin: 2.6 gm/dL — ABNORMAL LOW (ref 3.5–5.0)
Alkaline Phosphatase: 128 U/L (ref 40–145)
Anion Gap: 9.8 mMol/L (ref 7.0–18.0)
BUN / Creatinine Ratio: 7 Ratio — ABNORMAL LOW (ref 10.0–30.0)
BUN: 5 mg/dL — ABNORMAL LOW (ref 7–22)
Bilirubin, Total: 0.8 mg/dL (ref 0.1–1.2)
CO2: 21.3 mMol/L (ref 20.0–30.0)
Calcium: 8.2 mg/dL — ABNORMAL LOW (ref 8.5–10.5)
Chloride: 111 mMol/L — ABNORMAL HIGH (ref 98–110)
Creatinine: 0.71 mg/dL (ref 0.60–1.20)
EGFR: >60 mL/min/{1.73_m2}
Globulin: 2.7 gm/dL (ref 2.0–4.0)
Glucose: 99 mg/dL (ref 70–99)
Osmolality Calc: 273 mOsm/kg — ABNORMAL LOW (ref 275–300)
Potassium: 4.1 mMol/L (ref 3.5–5.3)
Protein, Total: 5.3 gm/dL — ABNORMAL LOW (ref 6.0–8.3)
Sodium: 138 mMol/L (ref 136–147)

## 2014-07-05 LAB — CBC AND DIFFERENTIAL
Basophils %: 0.6 % (ref 0.0–3.0)
Basophils Absolute: 0 10*3/uL (ref 0.0–0.3)
Eosinophils %: 3.1 % (ref 0.0–7.0)
Eosinophils Absolute: 0.2 10*3/uL (ref 0.0–0.8)
Hematocrit: 33.7 % — ABNORMAL LOW (ref 36.0–48.0)
Hemoglobin: 11.2 gm/dL — ABNORMAL LOW (ref 12.0–16.0)
Lymphocytes Absolute: 1.9 10*3/uL (ref 0.6–5.1)
Lymphocytes: 25.7 % (ref 15.0–46.0)
MCH: 37 pg — ABNORMAL HIGH (ref 28–35)
MCHC: 33 gm/dL (ref 32–36)
MCV: 111 fL — ABNORMAL HIGH (ref 80–100)
MPV: 7.1 fL (ref 6.0–10.0)
Monocytes Absolute: 0.8 10*3/uL (ref 0.1–1.7)
Monocytes: 10.7 % (ref 3.0–15.0)
Neutrophils %: 59.9 % (ref 42.0–78.0)
Neutrophils Absolute: 4.5 10*3/uL (ref 1.7–8.6)
PLT CT: 205 10*3/uL (ref 130–440)
RBC: 3.05 10*6/uL — ABNORMAL LOW (ref 3.80–5.00)
RDW: 13.6 % (ref 11.0–14.0)
WBC: 7.5 10*3/uL (ref 4.0–11.0)

## 2014-07-05 LAB — LIPASE: Lipase: 119 U/L — ABNORMAL HIGH (ref 8–78)

## 2014-07-05 LAB — MAGNESIUM: Magnesium: 1.9 mg/dL (ref 1.6–2.6)

## 2014-07-05 LAB — VH DEXTROSE STICK GLUCOSE
Glucose POCT: 112 mg/dL — ABNORMAL HIGH (ref 70–99)
Glucose POCT: 119 mg/dL — ABNORMAL HIGH (ref 70–99)

## 2014-07-05 LAB — PHOSPHORUS: Phosphorus: 2.9 mg/dL (ref 2.3–4.7)

## 2014-07-05 MED ORDER — AMLODIPINE BESYLATE 5 MG PO TABS
10.0000 mg | ORAL_TABLET | Freq: Every day | ORAL | Status: DC
Start: 2014-07-05 — End: 2014-07-05
  Administered 2014-07-05: 10 mg via ORAL
  Filled 2014-07-05: qty 2

## 2014-07-05 MED ORDER — PROMETHAZINE HCL 12.5 MG PO TABS
12.5000 mg | ORAL_TABLET | Freq: Four times a day (QID) | ORAL | Status: DC | PRN
Start: 2014-07-05 — End: 2015-01-01

## 2014-07-05 MED ORDER — PANTOPRAZOLE SODIUM 40 MG PO TBEC
40.0000 mg | DELAYED_RELEASE_TABLET | Freq: Every day | ORAL | Status: DC
Start: 2014-07-06 — End: 2014-07-05

## 2014-07-05 MED ORDER — OXYCODONE HCL 5 MG PO TABS
5.0000 mg | ORAL_TABLET | ORAL | Status: DC | PRN
Start: 2014-07-05 — End: 2014-07-05
  Administered 2014-07-05: 5 mg via ORAL
  Filled 2014-07-05: qty 1

## 2014-07-05 MED ORDER — LABETALOL HCL 200 MG PO TABS
200.0000 mg | ORAL_TABLET | Freq: Two times a day (BID) | ORAL | Status: DC
Start: 2014-07-05 — End: 2014-07-05

## 2014-07-05 MED ORDER — LABETALOL HCL 200 MG PO TABS
200.0000 mg | ORAL_TABLET | Freq: Two times a day (BID) | ORAL | Status: DC
Start: 2014-07-05 — End: 2014-07-05
  Administered 2014-07-05: 200 mg via ORAL
  Filled 2014-07-05 (×2): qty 1

## 2014-07-05 MED ORDER — AMLODIPINE BESYLATE 5 MG PO TABS
10.0000 mg | ORAL_TABLET | Freq: Every day | ORAL | Status: DC
Start: 2014-07-05 — End: 2014-07-05

## 2014-07-05 MED ORDER — OXYCODONE HCL 5 MG PO TABS
5.0000 mg | ORAL_TABLET | Freq: Four times a day (QID) | ORAL | Status: DC | PRN
Start: 2014-07-05 — End: 2014-07-24

## 2014-07-05 MED ORDER — DIPHENHYDRAMINE HCL 25 MG PO TABS
25.0000 mg | ORAL_TABLET | Freq: Every evening | ORAL | Status: DC | PRN
Start: 2014-07-05 — End: 2015-04-17

## 2014-07-05 NOTE — Discharge Summary (Signed)
DISCHARGE SUMMARY - [facility]     Patient Name: [name]   Attending Physician: Raul Del, DO   Primary Care Physician: Cain Saupe, MD   Date of Admission:   07/03/2014   Date of Discharge:   07/05/2014 LOS: 2 days   Admission Assessment and Plan by Dr. Wonda Cerise (Copied from H&P)   Discharge Diagnoses:   Acute on chronic alcoholic pancreatitis  Admit to medical floor  Continue IV fluid hydration is normal saline  Continue pain control  Keep her nothing by mouth for bowel rest  Monitor lipase in a.m.  Consider GI consultation if pain worsens  Hypertension  Continue home medications with holding parameters  Hypokalemia  Replete potassium with IV fluid  Potassium chloride 40 mEq once  Tobacco use  Smoking cessation counseling given  Declines nicotine replacement therapy  ETOH abuse  Monitor on CIWA protocol  Asthma was no exacerbation  Continue bronchodilators as needed  History of DVT status post IVC filter  Continue Lovenox for DVT prophylaxis Principal Problem (Resolved):    Acute alcoholic pancreatitis  Active Problems:    Essential hypertension    Tobacco use    ETOH abuse    Asthma  Resolved Problems:    Hypokalemia     Hospital Course     The patient was admitted for pain control, IV fluid hydration and monitoring.  It was assumed that the patient's pancreatitis was because of her alcohol abuse.  The patient did not show any signs or symptoms of delirium tremens.  She was put on the alcohol withdrawal pathway as a precaution.  Initially she was not allowed any oral intake.  She was also given a nicotine patch for her tobacco abuse.    By the day after admission the patient's symptoms had improved.  The IV fluid hydration and pain medications appear to be effective.  She was transitioned to clear liquids which she seemed to tolerate.  The patient did have some transient hypokalemia which responded to supplementation.  She  did not have an exacerbation of her asthma.  Her blood pressure remained largely controlled during this hospitalization.    By the day of discharge the patient was tolerating a low-fat diet.  She was counseled to quit smoking and avoid all alcohol.  Her pain was controlled with oral medications.  Her lipase level seemed to represent her improvement as well.    Discharge Condition: good   Discharge Instructions:        Disposition:  home  Diet: Cardiac Diet  Activity: As tolerated  Discharge Code Status: Full Code Cain Saupe, MD  [address]  [place] [address]  [phone]    In 3 days       Discharge Medications:                                                                      Discharge Medication List      Taking          amLODIPine 10 MG tablet   Dose:  10 mg   Commonly known as:  NORVASC   Take 10 mg by mouth daily.       diphenhydrAMINE 25 MG tablet  Dose:  25 mg   Commonly known as:  BENADRYL   Take 1 tablet (25 mg total) by mouth nightly as needed for Sleep.       labetalol 100 MG tablet   Dose:  200 mg   Commonly known as:  NORMODYNE   - Take 200 mg by mouth 2 (two) times daily. Take two 100 mg tablets for a total dose of 200 mg every 12 hours    -        multivitamin with minerals tablet   Dose:  1 tablet   Take 1 tablet by mouth daily.       oxyCODONE 5 MG immediate release tablet   Dose:  5 mg   Commonly known as:  ROXICODONE   Take 1 tablet (5 mg total) by mouth every 6 (six) hours as needed.       pantoprazole 40 MG tablet   Dose:  40 mg   Commonly known as:  PROTONIX   Take 40 mg by mouth daily.       promethazine 12.5 MG tablet   Dose:  12.5 mg   Commonly known as:  PHENERGAN   Take 1 tablet (12.5 mg total) by mouth every 6 (six) hours as needed for Nausea.         STOP taking these medications          diphenhydrAMINE-acetaminophen 25-500 MG Tabs   Commonly known as:  TYLENOL PM       ibuprofen 200 MG tablet   Commonly known as:  ADVIL,MOTRIN            Treatment Team        Treatment Team:   Attending Provider: Floria Raveling D, DO   Procedures/Radiology performed:      XR ABDOMEN 2 VIEW WITH CHEST 1 VIEW     Discharge Day Exam (07/05/2014):   Physical Exam   Constitutional: She is oriented to person, place, and time. She appears well-developed and well-nourished. No distress.   HENT:   Head: Normocephalic and atraumatic.   Eyes: Conjunctivae are normal. No scleral icterus.   Neck: Neck supple. No tracheal deviation present.   Cardiovascular: Normal rate and regular rhythm.    Pulmonary/Chest: Effort normal and breath sounds normal. She has no wheezes. She has no rales.   Abdominal: Soft. Bowel sounds are normal. There is no tenderness.   Musculoskeletal: Normal range of motion.   Neurological: She is alert and oriented to person, place, and time.   Skin: Skin is warm and dry.   Psychiatric: Her behavior is normal. Thought content normal.    Temp:  [98.3 F (36.8 C)-99.1 F (37.3 C)] 99.1 F (37.3 C)  Heart Rate:  [70-82] 70  Resp Rate:  [18] 18  BP: (109-139)/(68-94) 113/68 mmHg  Wt Readings from Last 1 Encounters:   07/03/14 84.1 kg (185 lb 6.5 oz)      RECENT LABS (from the last 7 days)      Recent Labs  Lab 07/05/14  0648 07/04/14  0721   WBC 7.5 8.5   RBC 3.05* 3.43*   HEMOGLOBIN 11.2* 12.6   HEMATOCRIT 33.7* 37.5   MCV 111* 109*   PLT CT 205 237             Lab Results   Component Value Date    HGBA1CPERCNT 5.3 04/19/2012   Results for [name], [name]    Ref. Range 07/03/2014 12:53 07/04/2014 07:21 07/05/2014 06:48  Lipase Latest Ref Range: 8-78 U/L 2124 (H) 1508 (H) 119 (H)      Recent Labs  Lab 07/05/14  0648 07/04/14  0721 07/03/14  1906   GLUCOSE 99 133* 115*   SODIUM 138 140 141   POTASSIUM 4.1 3.9 3.0*   CHLORIDE 111* 110 105   CO2 21.3 24.1 24.1   BUN 5* 7 6*   CREATININE 0.71 0.82 0.76   EGFR >60 >60 >60   CALCIUM 8.2* 8.3* 8.6       Recent Labs  Lab 07/05/14  0648 07/04/14  0721   MAGNESIUM 1.9 1.8   PHOSPHORUS 2.9 2.6   ALBUMIN 2.6* 3.0*   PROTEIN, TOTAL  5.3* 5.9*   BILIRUBIN, TOTAL 0.8 0.9   ALKALINE PHOSPHATASE 128 126   ALT 40 47   AST (SGOT) 42 43*       Recent Labs  Lab 07/03/14  1712   SPECIFIC GRAVITY, UR 1.014  1.017   PH, URINE 6.0  6.0   PROTEIN, UR Negative   GLUCOSE, UA Negative   KETONES UA Negative   BILIRUBIN, UA Negative   BLOOD, UA Small*   NITRITE, UA Negative   UROBILINOGEN, UA Normal   LEUKOCYTE ESTERASE, UA Negative   WBC, UA 3   RBC, UA 1   BACTERIA, UA Rare*      Allergies:      Percocet   Time spent on discharging the patient:  35 minutes   MRN: [id number]  CSN: [id number]  DOB: 22-Jan-1961   Loran Auguste D Guiselle Mian, DO  07/05/2014 4:52 PM  [facility] Pager  -  [phone]  [facility] Mobile  -  [phone] [facility]  [address]  [place], [place]  [phone]

## 2014-07-05 NOTE — Progress Notes (Addendum)
CIWA assessment complete-Score 0

## 2014-07-05 NOTE — Progress Notes (Signed)
CIWA assessment complete-Score 0

## 2014-07-05 NOTE — Progress Notes (Signed)
Pt woke up c/o pain 8/10 in abdomen. PRN dilaudid given. Will monitor.

## 2014-07-06 NOTE — UM Notes (Addendum)
[facility] Utilization Management Review Sheet    NAME: [name]  MR#: [id number]    CSN#: [id number]    ROOM: 529/529-A AGE: 54 y.o.    ADMIT DATE AND TIME: 07/03/2014   17:30      PATIENT CLASS: inpatient      ATTENDING PHYSICIAN: No att. providers found  PAYOR:Payor: [employer] / Plan: CIG[employer] POS / Product Type: *No Product type* /       AUTH #:     DIAGNOSIS:     ICD-10-CM    1. 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 Hypertension    . Asthma without status asthmaticus    . Gastroesophageal reflux disease    . Pancreatitis    . DVT (deep venous thrombosis)    . S/P IVC filter    . Clostridium difficile colitis    . H/O ETOH abuse      sober 2 years until 03/2014         [name] is a 54 y.o. female was significant past medical history of hypertension and pancreatitis who presents to the hospital with complaints of abdominal pain of one-day duration. The pain is localized to the left upper quadrant was radiation to the back associated with nausea and vomiting. The vomitus is of ingested material nonprojectile. She also complains generalized body weakness but denies any fever nor chills. She admits having alcohol drinks intermittently but she attributes the pain continues to heavy lifting. She smokes 3 cigarettes a day. She denies recreational drug use. Denies itching.      07/03/14 1214 (!) 163/105 mmHg 97.7 F (36.5 C) 95 16 97 % 1.651 m (5\' 5" ) 84.6 kg (186 lb 8.2 oz)     Abdomen: Midline laparotomy scar presumptively not having soft, left upper quadrant tenderness, non-distended; no palpable masses, no hepatosplenomegaly, normoactive bowel sounds, no rebound or guarding    Labs   Wbc    10.1    Glucose   125   Bun 4   crea  0.78   Na  142   K  3.1    AST 71  ALT 71    Alk phos  187   Alb  3.9   Lipase   2124    Ua     UDS neg       XR ABDOMEN 2 VIEW WITH CHEST 1 VIEW   Final Result   1. Bibasilar atelectasis versus  inflammation.   2. No acute abnormality identified in the abdomen.          IN ER Reiceved     Dilaudid   1 mg  Iv  zofran   4 mg  Iv  Phenergan  12.5 mg  Iv   NS  1000 cc iv       07-03-2014   To medical tele   floor  Vs  q  4    I&O    CIAW    Npo     D   5   NS   20 kcl     125 cc hour iv     norvasc   10 mg  Po daily    levonix   40 mg sq  q  24  Hours    Cs ac and hs with ssi    Labetalol   200 mg  Po  Id   zofran  4  mg iv  Prn had x  2    K dur   40 meq  Po  X  2    Dilaudid   0.4 mg  Iv  Prn   Had x  2   PT eval and tx    Pain  9/10  Score  abd pain        07-04-2014   Lipase   1508  Continued on    D  5  NS  20 kcl  125  Cc hour iv    Dilaudid   0.4 mg iv  Prn had x    3   CIAW    Pain 7-9  On scale    abd pain      07-05-2014  Continued on   D 5  NS  20 kcl  At   125 cc hour iv   CIAW    Cardiac  Diet  Ordered      Dilaudid   0.4 mg iv   Had x  1     Oxycodone po prn had  x   1       norvasc   10 mg  Po daily    levonix   40 mg sq  q  24  Hours    Cs ac and hs with ssi    Labetalol   200 mg  Po  Id   zofran    4  mg iv  Prn    D/c  To home   07-05-2014  1816 pm

## 2014-07-21 ENCOUNTER — Inpatient Hospital Stay: Payer: Commercial Managed Care - POS | Admitting: Internal Medicine

## 2014-07-21 ENCOUNTER — Emergency Department: Payer: Commercial Managed Care - POS

## 2014-07-21 ENCOUNTER — Inpatient Hospital Stay
Admission: EM | Admit: 2014-07-21 | Discharge: 2014-07-24 | DRG: 440 | Disposition: A | Discharge status: Home / Self Care | Payer: Commercial Managed Care - POS | Attending: Internal Medicine | Admitting: Internal Medicine

## 2014-07-21 DIAGNOSIS — R109 Unspecified abdominal pain: Secondary | ICD-10-CM

## 2014-07-21 DIAGNOSIS — F17210 Nicotine dependence, cigarettes, uncomplicated: Secondary | ICD-10-CM | POA: Diagnosis present

## 2014-07-21 DIAGNOSIS — K219 Gastro-esophageal reflux disease without esophagitis: Secondary | ICD-10-CM | POA: Diagnosis present

## 2014-07-21 DIAGNOSIS — Z9049 Acquired absence of other specified parts of digestive tract: Secondary | ICD-10-CM | POA: Diagnosis present

## 2014-07-21 DIAGNOSIS — I10 Essential (primary) hypertension: Secondary | ICD-10-CM | POA: Diagnosis present

## 2014-07-21 DIAGNOSIS — Z720 Tobacco use: Secondary | ICD-10-CM

## 2014-07-21 DIAGNOSIS — K859 Acute pancreatitis without necrosis or infection, unspecified: Secondary | ICD-10-CM | POA: Diagnosis present

## 2014-07-21 DIAGNOSIS — K852 Alcohol induced acute pancreatitis without necrosis or infection: Secondary | ICD-10-CM

## 2014-07-21 DIAGNOSIS — J449 Chronic obstructive pulmonary disease, unspecified: Secondary | ICD-10-CM | POA: Diagnosis present

## 2014-07-21 DIAGNOSIS — J45909 Unspecified asthma, uncomplicated: Secondary | ICD-10-CM | POA: Diagnosis present

## 2014-07-21 DIAGNOSIS — K861 Other chronic pancreatitis: Secondary | ICD-10-CM | POA: Diagnosis present

## 2014-07-21 DIAGNOSIS — Z886 Allergy status to analgesic agent status: Secondary | ICD-10-CM

## 2014-07-21 DIAGNOSIS — E876 Hypokalemia: Secondary | ICD-10-CM | POA: Diagnosis present

## 2014-07-21 DIAGNOSIS — F1010 Alcohol abuse, uncomplicated: Secondary | ICD-10-CM | POA: Diagnosis present

## 2014-07-21 LAB — BASIC METABOLIC PANEL
Anion Gap: 17.2 mMol/L (ref 7.0–18.0)
Anion Gap: 14.3 mMol/L (ref 7.0–18.0)
Anion Gap: 14.4 mMol/L (ref 7.0–18.0)
BUN / Creatinine Ratio: 8.5 Ratio — ABNORMAL LOW (ref 10.0–30.0)
BUN / Creatinine Ratio: 9 Ratio — ABNORMAL LOW (ref 10.0–30.0)
BUN / Creatinine Ratio: 8.6 Ratio — ABNORMAL LOW (ref 10.0–30.0)
BUN: 7 mg/dL (ref 7–22)
BUN: 7 mg/dL (ref 7–22)
BUN: 7 mg/dL (ref 7–22)
CO2: 23.7 mMol/L (ref 20.0–30.0)
CO2: 22.3 mMol/L (ref 20.0–30.0)
CO2: 22.1 mMol/L (ref 20.0–30.0)
Calcium: 9.5 mg/dL (ref 8.5–10.5)
Calcium: 8.9 mg/dL (ref 8.5–10.5)
Calcium: 9.2 mg/dL (ref 8.5–10.5)
Chloride: 101 mMol/L (ref 98–110)
Chloride: 105 mMol/L (ref 98–110)
Chloride: 106 mMol/L (ref 98–110)
Creatinine: 0.82 mg/dL (ref 0.60–1.20)
Creatinine: 0.78 mg/dL (ref 0.60–1.20)
Creatinine: 0.81 mg/dL (ref 0.60–1.20)
EGFR: >60 mL/min/{1.73_m2}
EGFR: >60 mL/min/{1.73_m2}
EGFR: >60 mL/min/{1.73_m2}
Glucose: 108 mg/dL — ABNORMAL HIGH (ref 70–99)
Glucose: 98 mg/dL (ref 70–99)
Glucose: 108 mg/dL — ABNORMAL HIGH (ref 70–99)
Osmolality Calc: 276 mOsm/kg (ref 275–300)
Osmolality Calc: 274 mOsm/kg — ABNORMAL LOW (ref 275–300)
Osmolality Calc: 276 mOsm/kg (ref 275–300)
Potassium: 2.9 mMol/L — CL (ref 3.5–5.3)
Potassium: 3.6 mMol/L (ref 3.5–5.3)
Potassium: 3.5 mMol/L (ref 3.5–5.3)
Sodium: 139 mMol/L (ref 136–147)
Sodium: 138 mMol/L (ref 136–147)
Sodium: 139 mMol/L (ref 136–147)

## 2014-07-21 LAB — CBC AND DIFFERENTIAL
Basophils %: 0.6 % (ref 0.0–3.0)
Basophils Absolute: 0.1 10*3/uL (ref 0.0–0.3)
Eosinophils %: 4.9 % (ref 0.0–7.0)
Eosinophils Absolute: 0.4 10*3/uL (ref 0.0–0.8)
Hematocrit: 44.2 % (ref 36.0–48.0)
Hemoglobin: 15.3 gm/dL (ref 12.0–16.0)
Lymphocytes Absolute: 2.5 10*3/uL (ref 0.6–5.1)
Lymphocytes: 30.5 % (ref 15.0–46.0)
MCH: 36 pg — ABNORMAL HIGH (ref 28–35)
MCHC: 35 gm/dL (ref 32–36)
MCV: 105 fL — ABNORMAL HIGH (ref 80–100)
MPV: 7.1 fL (ref 6.0–10.0)
Monocytes Absolute: 0.7 10*3/uL (ref 0.1–1.7)
Monocytes: 8.5 % (ref 3.0–15.0)
Neutrophils %: 55.5 % (ref 42.0–78.0)
Neutrophils Absolute: 4.5 10*3/uL (ref 1.7–8.6)
PLT CT: 338 10*3/uL (ref 130–440)
RBC: 4.22 10*6/uL (ref 3.80–5.00)
RDW: 13.1 % (ref 11.0–14.0)
WBC: 8.1 10*3/uL (ref 4.0–11.0)

## 2014-07-21 LAB — HEPATIC FUNCTION PANEL
ALT: 44 U/L (ref 0–55)
AST (SGOT): 39 U/L (ref 10–42)
Albumin/Globulin Ratio: 0.95 Ratio (ref 0.70–1.50)
Albumin: 3.9 gm/dL (ref 3.5–5.0)
Alkaline Phosphatase: 149 U/L — ABNORMAL HIGH (ref 40–145)
Bilirubin Direct: 0.5 mg/dL — ABNORMAL HIGH (ref 0.0–0.3)
Bilirubin, Total: 1.2 mg/dL (ref 0.1–1.2)
Globulin: 4.1 gm/dL — ABNORMAL HIGH (ref 2.0–4.0)
Protein, Total: 8 gm/dL (ref 6.0–8.3)

## 2014-07-21 LAB — LIPASE: Lipase: 1530 U/L — ABNORMAL HIGH (ref 8–78)

## 2014-07-21 LAB — ETHANOL: Alcohol: <10 mg/dL (ref 0–9)

## 2014-07-21 LAB — MAGNESIUM
Magnesium: 2 mg/dL (ref 1.6–2.6)
Magnesium: 2 mg/dL (ref 1.6–2.6)

## 2014-07-21 MED ORDER — ONDANSETRON HCL 4 MG/2ML IJ SOLN
4.0000 mg | Freq: Three times a day (TID) | INTRAMUSCULAR | Status: DC | PRN
Start: 2014-07-21 — End: 2014-07-21
  Administered 2014-07-21: 4 mg via INTRAVENOUS
  Filled 2014-07-21: qty 2

## 2014-07-21 MED ORDER — ONDANSETRON HCL 4 MG/2ML IJ SOLN
4.0000 mg | Freq: Four times a day (QID) | INTRAMUSCULAR | Status: DC | PRN
Start: 2014-07-21 — End: 2014-07-24
  Administered 2014-07-21 – 2014-07-23 (×7): 4 mg via INTRAVENOUS
  Filled 2014-07-21 (×7): qty 2

## 2014-07-21 MED ORDER — AMLODIPINE BESYLATE 5 MG PO TABS
10.0000 mg | ORAL_TABLET | Freq: Every day | ORAL | Status: DC
Start: 2014-07-21 — End: 2014-07-24
  Administered 2014-07-21 – 2014-07-24 (×4): 10 mg via ORAL
  Filled 2014-07-21 (×4): qty 2

## 2014-07-21 MED ORDER — ACETAMINOPHEN 650 MG RE SUPP
650.0000 mg | RECTAL | Status: DC | PRN
Start: 2014-07-21 — End: 2014-07-24

## 2014-07-21 MED ORDER — POTASSIUM CHLORIDE CRYS ER 20 MEQ PO TBCR
40.0000 meq | EXTENDED_RELEASE_TABLET | Freq: Once | ORAL | Status: AC
Start: 2014-07-21 — End: 2014-07-21
  Administered 2014-07-21: 40 meq via ORAL

## 2014-07-21 MED ORDER — POTASSIUM CHLORIDE 10 MEQ/100ML IV SOLN
INTRAVENOUS | Status: AC
Start: 2014-07-21 — End: ?
  Filled 2014-07-21: qty 200

## 2014-07-21 MED ORDER — POTASSIUM CHLORIDE 10 MEQ/100ML IV SOLN
10.0000 meq | Freq: Once | INTRAVENOUS | Status: AC
Start: 2014-07-21 — End: 2014-07-21
  Administered 2014-07-21: 10 meq via INTRAVENOUS

## 2014-07-21 MED ORDER — IOHEXOL 350 MG/ML IV SOLN
100.0000 mL | Freq: Once | INTRAVENOUS | Status: AC | PRN
Start: 2014-07-21 — End: 2014-07-21
  Administered 2014-07-21: 100 mL via INTRAVENOUS

## 2014-07-21 MED ORDER — VH HYDROMORPHONE HCL PF 1 MG/ML CARPUJECT
1.0000 mg | INTRAMUSCULAR | Status: DC | PRN
Start: 2014-07-21 — End: 2014-07-23
  Administered 2014-07-21 – 2014-07-23 (×13): 1 mg via INTRAVENOUS
  Filled 2014-07-21 (×13): qty 1

## 2014-07-21 MED ORDER — DIPHENHYDRAMINE HCL 25 MG PO CAPS
25.0000 mg | ORAL_CAPSULE | Freq: Every evening | ORAL | Status: DC | PRN
Start: 2014-07-21 — End: 2014-07-24

## 2014-07-21 MED ORDER — OXYCODONE HCL 5 MG PO TABS
5.0000 mg | ORAL_TABLET | Freq: Four times a day (QID) | ORAL | Status: DC | PRN
Start: 2014-07-21 — End: 2014-07-24
  Administered 2014-07-21 – 2014-07-24 (×7): 5 mg via ORAL
  Filled 2014-07-21 (×7): qty 1

## 2014-07-21 MED ORDER — ACETAMINOPHEN 325 MG PO TABS
650.0000 mg | ORAL_TABLET | ORAL | Status: DC | PRN
Start: 2014-07-21 — End: 2014-07-24

## 2014-07-21 MED ORDER — VH HYDROMORPHONE HCL PF 1 MG/ML CARPUJECT
1.0000 mg | Freq: Once | INTRAMUSCULAR | Status: AC
Start: 2014-07-21 — End: 2014-07-21
  Administered 2014-07-21: 1 mg via INTRAVENOUS

## 2014-07-21 MED ORDER — PROCHLORPERAZINE EDISYLATE 5 MG/ML IJ SOLN
10.0000 mg | Freq: Four times a day (QID) | INTRAMUSCULAR | Status: DC | PRN
Start: 2014-07-21 — End: 2014-07-24
  Administered 2014-07-21 – 2014-07-23 (×2): 10 mg via INTRAVENOUS
  Filled 2014-07-21 (×3): qty 2

## 2014-07-21 MED ORDER — VH HYDROMORPHONE HCL PF 1 MG/ML CARPUJECT
INTRAMUSCULAR | Status: AC
Start: 2014-07-21 — End: ?
  Filled 2014-07-21: qty 1

## 2014-07-21 MED ORDER — SODIUM CHLORIDE 0.9 % IV SOLN
INTRAVENOUS | Status: DC
Start: 2014-07-21 — End: 2014-07-21

## 2014-07-21 MED ORDER — PANTOPRAZOLE SODIUM 40 MG PO TBEC
40.0000 mg | DELAYED_RELEASE_TABLET | Freq: Every day | ORAL | Status: DC
Start: 2014-07-21 — End: 2014-07-24
  Administered 2014-07-21 – 2014-07-24 (×4): 40 mg via ORAL
  Filled 2014-07-21 (×4): qty 1

## 2014-07-21 MED ORDER — CEROVITE ADVANCED FORMULA PO TABS
1.0000 | ORAL_TABLET | Freq: Every day | ORAL | Status: DC
Start: 2014-07-21 — End: 2014-07-24
  Administered 2014-07-21 – 2014-07-24 (×3): 1 via ORAL
  Filled 2014-07-21 (×4): qty 1

## 2014-07-21 MED ORDER — LABETALOL HCL 200 MG PO TABS
200.0000 mg | ORAL_TABLET | Freq: Two times a day (BID) | ORAL | Status: DC
Start: 2014-07-21 — End: 2014-07-24
  Administered 2014-07-21 – 2014-07-24 (×7): 200 mg via ORAL
  Filled 2014-07-21 (×8): qty 1

## 2014-07-21 MED ORDER — SODIUM CHLORIDE 0.9 % IV BOLUS
500.0000 mL | INTRAVENOUS | Status: AC
Start: 2014-07-21 — End: 2014-07-21
  Administered 2014-07-21 (×4): 500 mL via INTRAVENOUS

## 2014-07-21 MED ORDER — SODIUM CHLORIDE 0.9 % IJ SOLN
0.4000 mg | INTRAMUSCULAR | Status: DC | PRN
Start: 2014-07-21 — End: 2014-07-24

## 2014-07-21 MED ORDER — POTASSIUM CHLORIDE CRYS ER 20 MEQ PO TBCR
EXTENDED_RELEASE_TABLET | ORAL | Status: AC
Start: 2014-07-21 — End: ?
  Filled 2014-07-21: qty 2

## 2014-07-21 MED ORDER — ENOXAPARIN SODIUM 40 MG/0.4ML SC SOLN
40.0000 mg | Freq: Every day | SUBCUTANEOUS | Status: DC
Start: 2014-07-21 — End: 2014-07-24
  Administered 2014-07-23: 40 mg via SUBCUTANEOUS
  Filled 2014-07-21 (×4): qty 0.4

## 2014-07-21 MED ORDER — SODIUM CHLORIDE 0.9 % IV SOLN
INTRAVENOUS | Status: DC
Start: 2014-07-21 — End: 2014-07-24

## 2014-07-21 MED ORDER — VH HYDROMORPHONE HCL PF 1 MG/ML CARPUJECT
1.0000 mg | Freq: Four times a day (QID) | INTRAMUSCULAR | Status: DC | PRN
Start: 2014-07-21 — End: 2014-07-21
  Administered 2014-07-21: 1 mg via INTRAVENOUS
  Filled 2014-07-21: qty 1

## 2014-07-21 MED ORDER — VALLEY PROMETHAZINE 50 MG/0.4 ML TOPICAL GEL UD (RPKG)
25.0000 mg | Freq: Four times a day (QID) | TOPICAL | Status: DC | PRN
Start: 2014-07-21 — End: 2014-07-24
  Administered 2014-07-23: 25 mg via TOPICAL
  Filled 2014-07-21: qty 0.4

## 2014-07-21 MED ORDER — SODIUM CHLORIDE 0.9 % IV BOLUS
1000.0000 mL | Freq: Once | INTRAVENOUS | Status: AC
Start: 2014-07-21 — End: 2014-07-21
  Administered 2014-07-21: 1000 mL via INTRAVENOUS

## 2014-07-21 NOTE — ED Provider Notes (Signed)
Physician/Midlevel provider first contact with patient: 07/21/14 0357         History     Chief Complaint   Patient presents with   . Abdominal Pain   . Back Pain     Patient is a 54 y.o. female presenting with abdominal pain and back pain. The history is provided by the patient.   Abdominal Pain  Pain location:  Epigastric (Pt with alcohol induced pancreatitis in past last EtoH one week ago present with epigastric pain rad to back sim to previous pancreatitis)  Pain quality: sharp    Pain radiates to:  Back  Pain severity:  Severe  Onset quality:  Gradual  Duration: onset Sat night.  Timing:  Constant  Progression:  Worsening  Chronicity:  Recurrent  Context: alcohol use    Relieved by:  Nothing  Worsened by:  Eating  Ineffective treatments:  None tried  Associated symptoms: nausea and vomiting    Associated symptoms: no anorexia, no chest pain, no constipation, no cough, no diarrhea, no dysuria, no fever, no hematemesis, no hematochezia, no hematuria, no melena, no shortness of breath, no sore throat, no vaginal bleeding and no vaginal discharge    Associated symptoms comment:  6 episode of emesis  Risk factors: alcohol abuse    Risk factors: has not had multiple surgeries and no recent hospitalization    Risk factors comment:  Alcohol abuse history  Back Pain  Associated symptoms: abdominal pain    Associated symptoms: no chest pain, no dysuria, no fever and no headaches             Past Medical History   Diagnosis Date   . Hypertension    . Asthma without status asthmaticus    . Gastroesophageal reflux disease    . Pancreatitis    . DVT (deep venous thrombosis)    . S/P IVC filter    . Clostridium difficile colitis    . H/O ETOH abuse      sober 2 years until 03/2014       Past Surgical History   Procedure Laterality Date   . Colonoscopy  12/26/2012     Procedure: COLONOSCOPY;  Surgeon: Gwenith Spitz, MD;  Location: [facility];  Service: Gastroenterology;  Laterality: N/A;   . Colonoscopy, polypectomy   12/26/2012     Procedure: COLONOSCOPY, POLYPECTOMY;  Surgeon: Gwenith Spitz, MD;  Location: [facility];  Service: Gastroenterology;  Laterality: N/A;   . Cholecystectomy     . Pancreatectomy  01/2011     partial       History reviewed. No pertinent family history.    Social  History   Substance Use Topics   . Smoking status: Current Every Day Smoker -- 0.25 packs/day for 30 years     Types: Cigarettes   . Smokeless tobacco: Never Used   . Alcohol Use: Yes      Comment: occasional       .     Allergies   Allergen Reactions   . Percocet [Oxycodone-Acetaminophen] Other (See Comments)     Makes her "loopy."       Home Medications     Last Medication Reconciliation Action:  Complete Burnett Harry, RN 07/21/2014  8:54 AM                  amLODIPine (NORVASC) 10 MG tablet     Take 10 mg by mouth daily.     diphenhydrAMINE (BENADRYL) 25 MG tablet  Take 1 tablet (25 mg total) by mouth nightly as needed for Sleep.     labetalol (NORMODYNE) 100 MG tablet     Take 200 mg by mouth 2 (two) times daily. Take two 100 mg tablets for a total dose of 200 mg every 12 hours       Multiple Vitamins-Minerals (MULTIVITAMIN WITH MINERALS) tablet     Take 1 tablet by mouth daily.     oxyCODONE (ROXICODONE) 5 MG immediate release tablet     Take 1 tablet (5 mg total) by mouth every 6 (six) hours as needed.     pantoprazole (PROTONIX) 40 MG tablet     Take 40 mg by mouth daily.     promethazine (PHENERGAN) 12.5 MG tablet     Take 1 tablet (12.5 mg total) by mouth every 6 (six) hours as needed for Nausea.           Review of Systems   Constitutional: Negative for fever.   HENT: Negative for congestion, ear pain, rhinorrhea and sore throat.    Eyes: Negative for discharge and redness.   Respiratory: Negative for cough and shortness of breath.    Cardiovascular: Negative for chest pain and leg swelling.   Gastrointestinal: Positive for nausea, vomiting and abdominal pain. Negative for diarrhea, constipation, blood in stool, melena,  hematochezia, anorexia and hematemesis.   Genitourinary: Negative for dysuria, frequency, hematuria, flank pain, vaginal bleeding, vaginal discharge and menstrual problem.   Musculoskeletal: Positive for back pain. Negative for arthralgias.   Skin: Negative for rash.   Neurological: Negative for seizures and headaches.   Hematological: Negative for adenopathy. Does not bruise/bleed easily.   Psychiatric/Behavioral: Negative for suicidal ideas.       Physical Exam    BP: (!) 144/95 mmHg, Heart Rate: (!) 102, Temp: 97.7 F (36.5 C), Resp Rate: 18, SpO2: 97 %, Weight: 82.8 kg    Physical Exam   Constitutional: She is oriented to person, place, and time. She appears well-developed and well-nourished. No distress.   HENT:   Head: Normocephalic and atraumatic.   Right Ear: External ear normal.   Left Ear: External ear normal.   Nose: Nose normal.   Mouth/Throat: Oropharynx is clear and moist. No oropharyngeal exudate.   Eyes: Conjunctivae and EOM are normal. Pupils are equal, round, and reactive to light. Right eye exhibits no discharge. Left eye exhibits no discharge. Right conjunctiva is not injected. No scleral icterus.   Neck: Normal range of motion. Neck supple. No tracheal deviation present. No thyroid mass and no thyromegaly present.   Cardiovascular: Normal rate, regular rhythm, normal heart sounds and intact distal pulses.  Exam reveals no gallop and no friction rub.    No murmur heard.  Pulmonary/Chest: Breath sounds normal. No accessory muscle usage or stridor. No respiratory distress. She has no wheezes. She has no rales.   Abdominal: Soft. Bowel sounds are normal. She exhibits no distension and no mass. There is no hepatosplenomegaly. There is tenderness in the epigastric area. There is no rigidity, no rebound and no guarding.   Musculoskeletal: Normal range of motion. She exhibits no edema or tenderness.   Neurological: She is alert and oriented to person, place, and time. No cranial nerve deficit. She  exhibits normal muscle tone. Coordination normal.   Skin: Skin is warm and dry. No rash noted. She is not diaphoretic. No erythema. No pallor.   Psychiatric: She has a normal mood and affect. Her behavior is normal.   Nursing  note and vitals reviewed.        MDM and ED Course     ED Medication Orders     Start Ordered     Status Ordering Provider    07/21/14 0631 07/21/14 0630  HYDROmorphone (DILAUDID) injection 1 mg   Once in ED     Route: Intravenous  Ordered Dose: 1 mg     Last MAR action:  Given Fabian Sharp    07/21/14 0510 07/21/14 0509  sodium chloride 0.9 % bolus 1,000 mL   Once in ED     Route: Intravenous  Ordered Dose: 1,000 mL     Last MAR action:  Hayden Pedro    07/21/14 0510 07/21/14 0509  HYDROmorphone (DILAUDID) injection 1 mg   Once in ED     Route: Intravenous  Ordered Dose: 1 mg     Last MAR action:  Given Fabian Sharp    07/21/14 0434 07/21/14 0433  potassium chloride (K-DUR,KLOR-CON) CR tablet 40 mEq   Once in ED     Route: Oral  Ordered Dose: 40 mEq     Last [facility] action:  Given Fabian Sharp    07/21/14 0434 07/21/14 0433  potassium chloride 10 mEq in 100 mL IVPB (premix)   Once     Route: Intravenous  Ordered Dose: 10 mEq     Last MAR action:  Hayden Pedro    07/21/14 0434 07/21/14 0433  potassium chloride 10 mEq in 100 mL IVPB (premix)   Once     Route: Intravenous  Ordered Dose: 10 mEq     Last MAR action:  Stopped Sonia Baller R              MDM  Number of Diagnoses or Management Options  Acute abdominal pain: new and requires workup  Acute hypokalemia: new and requires workup  Alcohol-induced acute pancreatitis: new and requires workup  Essential hypertension: established and worsening  ETOH abuse: established and worsening  Diagnosis management comments: The patient presents to the Emergency Department with abdominal pain. Treatment has been initiated in the ER, but the patient has not had significant improvement in symptoms, appears  ill enough and/or has illness/findings/co-morbidities that make admission for IV medications, possible surgical consult, and further management the most appropriate disposition. Differential diagnosis has included but is not limited to appendicitis, gall bladder disease, bowel obstruction, colitis, gastroenteritis, urinary tract obstruction, AAA, pancreatitic and hepatitis.  Diagnostic impression and plan were discussed and agreed upon with the patient and/or family.  Results of lab/radiology tests were reviewed and discussed with the patient and/or family. All questions were answered and concerns addressed.  Appropriate consultation was made for admission and further treatment of this patient.               Amount and/or Complexity of Data Reviewed  Clinical lab tests: ordered and reviewed  Tests in the radiology section of CPT: ordered and reviewed  Tests in the medicine section of CPT: ordered and reviewed  Decide to obtain previous medical records or to obtain history from someone other than the patient: yes  Discuss the patient with other providers: yes  Independent visualization of images, tracings, or specimens: yes    Risk of Complications, Morbidity, and/or Mortality  Presenting problems: moderate  Diagnostic procedures: low  Management options: moderate    Patient Progress  Patient progress: improved         Procedures    Clinical Impression &  Disposition     Clinical Impression  Final diagnoses:   Acute abdominal pain   Alcohol-induced acute pancreatitis   Essential hypertension   ETOH abuse   Acute hypokalemia        ED Disposition     Admit Admitting Physician: Chyrl Civatte [[id number]]  Diagnosis: Recurrent acute pancreatitis [604540]  Estimated Length of Stay: 3 - 5 Days  Tentative Discharge Plan?: Home or Self Care [1]  Patient Class: Inpatient [101]             Current Discharge Medication List                      Fabian Sharp, MD  07/21/14 1636

## 2014-07-21 NOTE — ED Notes (Signed)
Cathy, receiving floor nurse, called to pass along that patient will need a repeat potassium level drawn after K rider finishes.

## 2014-07-21 NOTE — ED Notes (Signed)
Patient informed that she is going to room 520. No changes in stability.

## 2014-07-21 NOTE — Progress Notes (Signed)
Patient had tan colored emesis.  Not due for Zofran.  Called pharmacy for dose of Compazine, will bring up. Due to shortage not loaded in med pyxis.

## 2014-07-21 NOTE — Plan of Care (Signed)
Problem: Safety  Goal: Patient will be free from injury during hospitalization  Outcome: Progressing    Problem: Pain  Goal: Patient's pain/discomfort is manageable  Outcome: Progressing  Patients pain is manageable with IV dilaudid.      Problem: Psychosocial and Spiritual Needs  Goal: Demonstrates ability to cope with hospitalization/illness  Outcome: Progressing

## 2014-07-21 NOTE — H&P (Signed)
ADMISSION HISTORY AND PHYSICAL EXAM    Date Time: 07/21/2014 7:06 AM  Patient Name: [name]  Attending Physician: Fabian Sharp, MD  Primary Care Physician: Cain Saupe, MD    CC: Abd pain     History of Presenting Illness:   [name] is a 54 y.o. female who presents to the hospital with 3 days hx of worsening epigastric pain.  Pt has hx of recurrent ETOH pancreatitis.  She denies any heavy ETOH use since her last admission for pancreatitis less a month ago.  Pt reports just having one alcoholic drink last week.     Pt tried to tx her abd pain by resting her bowel and with po pain medications.  However, abd pain just got worse and pt felt very weak and dry.  Therefore she decided to seek medical help by coming to the ED tonight.     Denies any f/c but felt hot and cold at times.  No n/v/diarrhea.  No cp/sob/cough/wheezing.  No leg pain or swelling.     Past Medical History:     Past Medical History   Diagnosis Date   . Hypertension    . Asthma without status asthmaticus    . Gastroesophageal reflux disease    . Pancreatitis    . DVT (deep venous thrombosis)    . S/P IVC filter    . Clostridium difficile colitis    . H/O ETOH abuse      sober 2 years until 03/2014       Past Surgical History:     Past Surgical History   Procedure Laterality Date   . Colonoscopy  12/26/2012     Procedure: COLONOSCOPY;  Surgeon: Gwenith Spitz, MD;  Location: [facility];  Service: Gastroenterology;  Laterality: N/A;   . Colonoscopy, polypectomy  12/26/2012     Procedure: COLONOSCOPY, POLYPECTOMY;  Surgeon: Gwenith Spitz, MD;  Location: [facility];  Service: Gastroenterology;  Laterality: N/A;   . Cholecystectomy     . 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 No pertinent family history.    Social History:     History     Social History   . Marital Status: Divorced     Spouse Name: N/A   . Number of Children: N/A   . Years of Education: N/A      Occupational History   . Not on file.     Social History Main Topics   . Smoking status: Current Every Day Smoker -- 0.25 packs/day     Types: Cigarettes   . Smokeless tobacco: Never Used   . Alcohol Use: Yes      Comment: occasional   . Drug Use: No   . Sexual Activity: Not on file     Other Topics Concern   . Not on file     Social History Narrative       Allergies:     Allergies   Allergen Reactions   . Percocet [Oxycodone-Acetaminophen] Other (See Comments)     Makes her "loopy."       Medications:     Prior to Admission medications    Medication Sig Start Date End Date Taking? Authorizing Provider   amLODIPine (NORVASC) 10 MG tablet Take 10 mg by mouth daily.   Yes [provider]   diphenhydrAMINE (BENADRYL) 25 MG tablet Take 1 tablet (25 mg total) by mouth nightly  as needed for Sleep. 07/05/14  Yes Solanki, Madhur D, DO   labetalol (NORMODYNE) 100 MG tablet Take 200 mg by mouth 2 (two) times daily. Take two 100 mg tablets for a total dose of 200 mg every 12 hours     Yes [provider]   Multiple Vitamins-Minerals (MULTIVITAMIN WITH MINERALS) tablet Take 1 tablet by mouth daily.   Yes [provider]   oxyCODONE (ROXICODONE) 5 MG immediate release tablet Take 1 tablet (5 mg total) by mouth every 6 (six) hours as needed. 07/05/14  Yes Solanki, Madhur D, DO   pantoprazole (PROTONIX) 40 MG tablet Take 40 mg by mouth daily.   Yes [provider]   promethazine (PHENERGAN) 12.5 MG tablet Take 1 tablet (12.5 mg total) by mouth every 6 (six) hours as needed for Nausea. 07/05/14  Yes Solanki, Madhur D, DO       Review of Systems:     Constitutional: Negative for fever, chills, weight loss, malaise/fatigue and diaphoresis.   HENT: Negative for hearing loss, ear pain, nosebleeds, congestion, sore throat, neck pain and tinnitus.    Eyes: Negative for blurred vision, double vision and photophobia.   Respiratory: Negative for cough, hemoptysis and shortness of breath.     Cardiovascular: Negative for chest pain, palpitations, orthopnea, claudication, leg swelling and PND.   Gastrointestinal: Negative for heartburn, nausea, vomiting,  diarrhea, constipation, blood in stool and melena.   Genitourinary: Negative for dysuria, urgency, frequency, hematuria and flank pain.   Musculoskeletal: Negative for myalgias, back pain and falls.   Skin: Negative for itching and rash.   Neurological: Negative for dizziness, tingling, tremors, sensory change, speech change, focal weakness, loss of consciousness, weakness and headaches.   Psychiatric/Behavioral: Negative for depression and suicidal ideas.       Physical Exam:   Patient Vitals for the past 24 hrs:   BP Temp Pulse Resp SpO2 Height Weight   07/21/14 0630 131/87 mmHg - 72 13 100 % - -   07/21/14 0617 113/77 mmHg - 70 14 91 % - -   07/21/14 0600 120/80 mmHg - 85 (!) 26 96 % - -   07/21/14 0500 132/83 mmHg - 68 16 97 % - -   07/21/14 0333 (!) 144/95 mmHg 97.7 F (36.5 C) (!) 102 18 97 % 1.676 m (5\' 6" ) 82.8 kg (182 lb 8.7 oz)     Body mass index is 29.48 kg/(m^2).  No intake or output data in the 24 hours ending 07/21/14 0706    General: awake, alert, no acute distress.  EYES:  perrla, eomi, sclera anicteric   HENT:  Head atraumatic, oropharynx clear without lesions, mucous membranes dry  Neck: supple, no lymphadenopathy, no thyromegaly, no JVD, no carotid bruits  Cardiovascular: regular rate and rhythm,normal S1,S2 no murmurs, rubs or gallops, normal palpation  Lungs: clear to auscultation bilaterally, without wheezing, rhonchi, or rales, nomal palpation, normal respiratory effort  Abdomen: soft,  non-distended; no palpable masses, no hepatosplenomegaly, normoactive bowel sounds, TTP in the epigastric area with no rebound or guarding  Extremities: no clubbing, cyanosis, or edema, normal flexion and extension  Neuro: cranial nervesII-XII grossly intact, strength 5/5 in upper and lower extremities, sensation intact, mental status  intact  Skin: warm and dry, no rashes or lesions noted  Psychiatric:   normal affect and orientation      Labs:     Results     Procedure Component Value Units Date/Time    Magnesium [[id number]] Collected:  07/21/14 0342    Specimen Information:  Plasma Updated:  07/21/14 0557     Magnesium 2.0 mg/dL     Alcohol Level [[id number]] Collected:  07/21/14 0342    Specimen Information:  Plasma Updated:  07/21/14 0524     Alcohol <10 mg/dL     Lipase [[id number]]  (Abnormal) Collected:  07/21/14 0342    Specimen Information:  Plasma Updated:  07/21/14 0443     Lipase 1530 (H) U/L     Basic Metabolic Panel [[id number]]  (Abnormal) Collected:  07/21/14 0342    Specimen Information:  Plasma Updated:  07/21/14 0423     Sodium 139 mMol/L      Potassium 2.9 (LL) mMol/L      Chloride 101 mMol/L      CO2 23.7 mMol/L      CALCIUM 9.5 mg/dL      Glucose 010 (H) mg/dL      Creatinine 9.32 mg/dL      BUN 7 mg/dL      Anion Gap 35.5 mMol/L      BUN/Creatinine Ratio 8.5 (L) Ratio      EGFR >60 mL/min/1.34m2      Osmolality Calc 276 mOsm/kg     Hepatic function panel (LFT) [[id number]]  (Abnormal) Collected:  07/21/14 0342    Specimen Information:  Plasma Updated:  07/21/14 0420     Protein, Total 8.0 gm/dL      Albumin 3.9 gm/dL      Alkaline Phosphatase 149 (H) U/L      ALT 44 U/L      AST (SGOT) 39 U/L      Bilirubin, Total 1.2 mg/dL      Bilirubin, Direct 0.5 (H) mg/dL      Albumin/Globulin Ratio 0.95 Ratio      Globulin 4.1 (H) gm/dL     CBC and differential [[id number]]  (Abnormal) Collected:  07/21/14 0342    Specimen Information:  Blood / Blood Updated:  07/21/14 0357     WBC 8.1 K/cmm      RBC 4.22 M/cmm      Hemoglobin 15.3 gm/dL      Hematocrit 31.5 %      MCV 105 (H) fL      MCH 36 (H) pg      MCHC 35 gm/dL      RDW 17.6 %      PLT CT 338 K/cmm      MPV 7.1 fL      NEUTROPHIL % 55.5 %      Lymphocytes 30.5 %      Monocytes 8.5 %      Eosinophils % 4.9 %      Basophils % 0.6 %      Neutrophils Absolute 4.5 K/cmm      Lymphocytes  Absolute 2.5 K/cmm      Monocytes Absolute 0.7 K/cmm      Eosinophils Absolute 0.4 K/cmm      BASO Absolute 0.1 K/cmm             Radiology:     Radiology Results (24 Hour)     Procedure Component Value Units Date/Time    CT Abdomen Pelvis with IV Cont [[id number]] Collected:  07/21/14 0543    Order Status:  Completed Updated:  07/21/14 0551    Narrative:      Clinical History:  Abdominal pain    Technique:  CT of abdomen and pelvis with intravenous contrast obtained. Multiplanar reconstructions performed.    Contrast:  100 cc Omnipaque 350    Comparison:  09/24/2011    Findings:    Lower chest:  The lung bases are clear. No pleural effusions. Coronary artery calcifications.    Abdomen:  Liver normal size, contour and density. Mild intrahepatic and extrahepatic bile ductal dilatation postcholecystectomy.     Cholecystectomy clips.    Moderate edematous changes pancreatic neck and body. Moderate peripancreatic fat stranding. No discrete collection.  Moderate main pancreatic duct dilatation.    Spleen normal size. Calcifications right adrenal gland.     Kidneys normal size and contour. Right renal cysts. No hydronephrosis, mass or stones.    The stomach, duodenum and small bowel are normal in caliber without inflammatory changes.    Colon normal caliber and wall thickness without inflammation or mass.    No mass, pathologic adenopathy. Trace perihepatic ascites. The aorta and IVC are normal caliber. Tortuous aorta.  Infrarenal IVC filter in place.    Pelvis:  Solid organs and bladder unremarkable.    No mass, pathologic adenopathy or free fluid. No inflammatory change.    Bones and Soft Tissues:  Broad-based supraumbilical ventral hernia containing omental fat, vessels, trace ascites.  Mild degenerative changes throughout the lumbar spine.      Impression:      Acute pancreatitis.    Moderate intrahepatic and extrahepatic bile ductal dilatation postcholecystectomy.    Ventral hernia.    Recommendation:  Correlation  amylase, lipase.  Follow-up to resolution.        ReadingStation:[id number]          Assessment:   Active Problems:    Acute recurrent pancreatitis    Recurrent acute pancreatitis        Plan:     Acute pancreatitis, recurrent  Could be due to ETOH but pt reports just 1 drink of ETOH last week.  Admit for IVF/pain control and bowel rest on clear liquid diet.    HTN  Continue Norvasc 10mg  daily and Labetalol 200mg  bid  with holding parameters.    GERD  PPI continued.     COPD/Asthma  No e/o acute exacerbation clinically  Duoneb prn ordered.    Ongoing tobacco abuse  Pt now down to 2-3 cigarettes per day.  Cessation strongly advised.    Hypokalemia  Total of 60 meq KCl IV/PO ordered for replacement  Will repeat chem 8 in am and at 5pm today.       DVT prophylaxis  Lovenox sc 40mg  daily.                Signed by: Jahkari Maclin Koren Bound, MD  QM:VHQION, Alecia Lemming, MD

## 2014-07-21 NOTE — Progress Notes (Signed)
Patient has been vomiting.  Gave Zofran for nausea and vomiting.  Will continue to monitor.

## 2014-07-21 NOTE — Progress Notes (Signed)
Patient is admitted today. H & P, orders and investigations are reveiwd. Agree with management.  Increased rate of infusion of IV fluid for hydration.

## 2014-07-21 NOTE — Progress Notes (Signed)
Patient admitted.  Assessment completed.  Patient has no S/S of distress.  Patient has pain level of a 7.  Pain medication was given.  Patient was oriented to room.  Call bell within reach.

## 2014-07-22 LAB — BASIC METABOLIC PANEL
Anion Gap: 14.3 mMol/L (ref 7.0–18.0)
BUN / Creatinine Ratio: 9.9 Ratio — ABNORMAL LOW (ref 10.0–30.0)
BUN: 7 mg/dL (ref 7–22)
CO2: 19.6 mMol/L — ABNORMAL LOW (ref 20.0–30.0)
Calcium: 8.4 mg/dL — ABNORMAL LOW (ref 8.5–10.5)
Chloride: 106 mMol/L (ref 98–110)
Creatinine: 0.71 mg/dL (ref 0.60–1.20)
EGFR: >60 mL/min/{1.73_m2}
Glucose: 118 mg/dL — ABNORMAL HIGH (ref 70–99)
Osmolality Calc: 271 mOsm/kg — ABNORMAL LOW (ref 275–300)
Potassium: 3.9 mMol/L (ref 3.5–5.3)
Sodium: 136 mMol/L (ref 136–147)

## 2014-07-22 LAB — CBC AND DIFFERENTIAL
Basophils %: 0.5 % (ref 0.0–3.0)
Basophils Absolute: 0.1 10*3/uL (ref 0.0–0.3)
Eosinophils %: 0.9 % (ref 0.0–7.0)
Eosinophils Absolute: 0.1 10*3/uL (ref 0.0–0.8)
Hematocrit: 38.8 % (ref 36.0–48.0)
Hemoglobin: 12.9 gm/dL (ref 12.0–16.0)
Lymphocytes Absolute: 1.6 10*3/uL (ref 0.6–5.1)
Lymphocytes: 15.9 % (ref 15.0–46.0)
MCH: 36 pg — ABNORMAL HIGH (ref 28–35)
MCHC: 33 gm/dL (ref 32–36)
MCV: 109 fL — ABNORMAL HIGH (ref 80–100)
MPV: 7.2 fL (ref 6.0–10.0)
Monocytes Absolute: 0.7 10*3/uL (ref 0.1–1.7)
Monocytes: 7.1 % (ref 3.0–15.0)
Neutrophils %: 75.7 % (ref 42.0–78.0)
Neutrophils Absolute: 7.6 10*3/uL (ref 1.7–8.6)
PLT CT: 306 10*3/uL (ref 130–440)
RBC: 3.57 10*6/uL — ABNORMAL LOW (ref 3.80–5.00)
RDW: 13.3 % (ref 11.0–14.0)
WBC: 10 10*3/uL (ref 4.0–11.0)

## 2014-07-22 LAB — HEPATIC FUNCTION PANEL
ALT: 29 U/L (ref 0–55)
AST (SGOT): 29 U/L (ref 10–42)
Albumin/Globulin Ratio: 0.91 Ratio (ref 0.70–1.50)
Albumin: 3.2 gm/dL — ABNORMAL LOW (ref 3.5–5.0)
Alkaline Phosphatase: 113 U/L (ref 40–145)
Bilirubin Direct: 0.2 mg/dL (ref 0.0–0.3)
Bilirubin, Total: 0.5 mg/dL (ref 0.1–1.2)
Globulin: 3.5 gm/dL (ref 2.0–4.0)
Protein, Total: 6.7 gm/dL (ref 6.0–8.3)

## 2014-07-22 LAB — LIPASE: Lipase: 261 U/L — ABNORMAL HIGH (ref 8–78)

## 2014-07-22 LAB — MAGNESIUM: Magnesium: 1.6 mg/dL (ref 1.6–2.6)

## 2014-07-22 NOTE — Progress Notes (Signed)
Assumed care of client at 1900. Client is in bed and has pain of 10 in back and left side. Client is nauseated and has vomited brown clear bile once.  Charge nurse is attempting to place IV for pain medication and nausea med. Old IV was out, client unable to related why. Call bell within reach, will monitor.

## 2014-07-22 NOTE — Plan of Care (Signed)
Problem: Pain  Goal: Patient's pain/discomfort is manageable  Outcome: Progressing

## 2014-07-22 NOTE — Progress Notes (Signed)
Assisted nurse, hung new IV fluids. Pt c/o pain. Medicated with prn oxycodone. Denies other needs.

## 2014-07-22 NOTE — Progress Notes (Signed)
[facility]  [address]  [place] [address]    INITIAL ASSESSMENT  Case Management / Social Work      Estimated D/C Date: 4/16      PCP/Last visit: Dr. Mel Almond       Home assessment/PLOF: Lives at home with roommates. Independent, drives, works.       Financials and Insurance:        [employer]: [employer]- has prescription coverage      DME's/Supplier: none      Inpatient Plan of Care: Acute pancreatitis that is noted recurrent. IVF and pain control- bowel rest on clear liquid diet. COPD/Asthma noted stable.       CM Interventions: CM spoke with patient at bedside- anticipate no [place] needs upon screening. CM to follow.       Transportation: self vs friend/family (discussed needs to discuss with physician prior to [place] regarding if safe related to medications she may be on at that time).       Barriers to discharge:  none anticipated      D/C Plan and Needs:  Acute pancreatitis. IVF/Pain control. Clear liquids. Anticipate [facility] home without needs- CM to follow.       Suzan Slick Dorthy Cooler BSN, RN  Case Management  Extension [phone]

## 2014-07-22 NOTE — UM Notes (Signed)
CC- abd pain    54 y.o. female who presents to the hospital with 3 days hx of worsening epigastric pain. Pt has hx of recurrent ETOH pancreatitis. She denies any heavy ETOH use since her last admission for pancreatitis less a month ago. Pt reports just having one alcoholic drink last week. Pt tried to tx her abd pain by resting her bowel and with po pain medications. However, abd pain just got worse and pt felt very weak and dry. Therefore she decided to seek medical help by coming to the ED tonight.     PMH- HTN, asthma, GERD, pancreatitis, DVT s/p IVC filter, cdiff colitis, ETOH abuse    PSH- colonoscopy, cholecystectomy, pancreatectomy ( partial)    Abdomen:TTP in the epigastric area with no rebound or guarding    Assessment and Plan  Active Problems:   Acute recurrent pancreatitis   Recurrent acute pancreatitis  Acute pancreatitis, recurrent  Could be due to ETOH but pt reports just 1 drink of ETOH last week.  Admit for IVF/pain control and bowel rest on clear liquid diet.  Hypokalemia  Total of 60 meq KCl IV/PO ordered for replacement  Will repeat chem 8 in am and at 5pm today.     100.1-102,68-96%-18,26,14-144/95    lovenox  Dilaudid iv x 2 in ED  protonix po  Kdur once in ED  KCL iv x 2 on 4/12  1liter ivf bolus in ED  ivf bolus in ED  Ivf@150    Dilaudid iv prn x 3 4/12 x 3 4/13  zofran iv prn x 2 4/12 x 1 4/13   Compazine iv prn x 1 4/12  Clear liquids    Alk phos 149  Direct bili 0.5   Lipase 1530  K+2.9  BS 108      CT abd pelvis  IMPRESSION:   Acute pancreatitis.    Moderate intrahepatic and extrahepatic bile ductal dilatation postcholecystectomy.    Ventral hernia.    Recommendation:  Correlation amylase, lipase.  Follow-up to resolution.

## 2014-07-22 NOTE — Progress Notes (Signed)
PROGRESS NOTE - [facility]    Date Time: 07/22/2014 9:30 PM  Patient Name: [name]  Attending Physician: Darra Lis, MD    Assessment and Plan:                                                                                       [facility]     Active Problems:    Acute recurrent pancreatitis    Recurrent acute pancreatitis    Acute pancreatitis, recurrent slowly improving.  Etiology Could be due to ETOH but pt reports just 1 drink of ETOH last week.  Admit for IVF/pain control and bowel rest on clear liquid diet.    HTN  Continue Norvasc 10mg  daily and Labetalol 200mg  bid with holding parameters.    GERD  PPI continued.     COPD/Asthma  No e/o acute exacerbation clinically  Duoneb prn ordered.    Ongoing tobacco abuse  Pt now down to 2-3 cigarettes per day.  Cessation strongly advised.    Hypokalemia  Supplemented and corrected.       DVT prophylaxis  Lovenox sc 40mg  daily.    Lines:                                                                                                  [facility]     Patient Lines/Drains/Airways Status    Active PICC Line / CVC Line / PIV Line / Drain / Airway / Intraosseous Line / Epidural Line / ART Line / Line / Wound / Pressure Ulcer / NG/OG Tube     Name:   Placement date:   Placement time:   Site:   Days:    Peripheral IV 07/21/14 Left Antecubital  07/21/14   0340   Antecubital   1                Disposition:                                                                                        [facility]     Today's date: 07/22/2014  Length of Stay: 1    Anticipated medical stability for discharge in : 2 days    Subjective  [facility]     CC: Follow-up acute pancreatitis    Review of Systems:                                                                           [facility]     Constitutional: Negative for fever  and chills.     HEENT: Negative for nosebleeds and sore throat.      Respiratory: Negative for cough, sputum production, shortness of breath or wheezing.      Cardiovascular: No dyspnea, orthopnea or paroxysmal nocturnal dyspnea.     Gastrointestinal: Has abdominal pain, nausea, .     Genitourinary: Negative for dysuria.     Musculoskeletal: Negative for myalgias or joint pain.     Skin: Negative for rash.     Neurological: Negative for dizziness or headache.  No complaint of focal weakness or numbness    Physical Exam:                                                                                   [facility]   Temp:  [98 F (36.7 C)-100.1 F (37.8 C)] 98.3 F (36.8 C)  Heart Rate:  [86-88] 87  Resp Rate:  [14-18] 18  BP: (120-154)/(76-102) 120/76 mmHg    Intake/Output Summary (Last 24 hours) at 07/22/14 2130  Last data filed at 07/22/14 1800   Gross per 24 hour   Intake 4382.5 ml   Output   1300 ml   Net 3082.5 ml       General: awake, alert, oriented x 3; in no acute distress.    HEENT: Pupils are equal and reactive, no pallor, no icterus, moist tongue and buccal mucosa. Neck is supple, trachea is midline, no lymphadenopathy.    Cardiovascular: S1 and S2 well heard. No gallop and no friction rub. No jugular venous distention.    Lungs:  No use of accessory muscles of respiration. Lung fields are clear to auscultation bilaterally.    Abdomen: soft, positive epigastric and midabdominal tenderness. No rebound no guarding. Abdomen is non-distended; no palpable mass, no hepatosplenomegaly, bowel sounds well heard.    Extremities: No gross joint deformity. No clubbing and no acrocyanosis.    Neuro: Awake alert oriented 3. Cranial nerves grossly intact. No focal weakness or numbness. No gross lateralizing signs.    Skin: no rashes or lesions noted.    Meds:  [facility]     Medications were reviewed in  the electronic record: [x]     Labs and Imaging:                                                                             [facility]     Results     Procedure Component Value Units Date/Time    Lipase [[id number]]  (Abnormal) Collected:  07/22/14 0639    Specimen Information:  Plasma Updated:  07/22/14 0727     Lipase 261 (H) U/L     Hepatic function panel (LFT) [[id number]]  (Abnormal) Collected:  07/22/14 0639    Specimen Information:  Plasma Updated:  07/22/14 0727     Protein, Total 6.7 gm/dL      Albumin 3.2 (L) gm/dL      Alkaline Phosphatase 113 U/L      ALT 29 U/L      AST (SGOT) 29 U/L      Bilirubin, Total 0.5 mg/dL      Bilirubin, Direct 0.2 mg/dL      Albumin/Globulin Ratio 0.91 Ratio      Globulin 3.5 gm/dL     Basic Metabolic Panel [[id number]]  (Abnormal) Collected:  07/22/14 0639    Specimen Information:  Plasma Updated:  07/22/14 0727     Sodium 136 mMol/L      Potassium 3.9 mMol/L      Chloride 106 mMol/L      CO2 19.6 (L) mMol/L      CALCIUM 8.4 (L) mg/dL      Glucose 086 (H) mg/dL      Creatinine 5.78 mg/dL      BUN 7 mg/dL      Anion Gap 46.9 mMol/L      BUN/Creatinine Ratio 9.9 (L) Ratio      EGFR >60 mL/min/1.28m2      Osmolality Calc 271 (L) mOsm/kg     Magnesium [[id number]] Collected:  07/22/14 0639    Specimen Information:  Plasma Updated:  07/22/14 0727     Magnesium 1.6 mg/dL     CBC and differential [[id number]]  (Abnormal) Collected:  07/22/14 0639    Specimen Information:  Blood / Blood Updated:  07/22/14 0717     WBC 10.0 K/cmm      RBC 3.57 (L) M/cmm      Hemoglobin 12.9 gm/dL      Hematocrit 53.6 %      MCV 109 (H) fL      MCH 36 (H) pg      MCHC 33 gm/dL      RDW 64.4 %      PLT CT 306 K/cmm      MPV 7.2 fL      NEUTROPHIL % 75.7 %      Lymphocytes 15.9 %      Monocytes 7.1 %      Eosinophils % 0.9 %      Basophils % 0.5 %      Neutrophils Absolute 7.6 K/cmm      Lymphocytes Absolute 1.6 K/cmm      Monocytes Absolute 0.7 K/cmm      Eosinophils Absolute 0.1 K/cmm      BASO  Absolute  0.1 K/cmm           Microbiology, reviewed and are significant for:  Microbiology Results     None          Imaging, reviewed and are significant for:  Radiology Results (24 Hour)     ** No results found for the last 24 hours. **          I have spent 35 minutes with the patient, discussing , of which greater than 50% of the time was spent counseling and coordinating care.    Signed by: Darra Lis, MD    [facility]  [address]  [place], [id number]

## 2014-07-22 NOTE — Progress Notes (Signed)
Assumed care of patient at 0700. VSS. A/O. Medicated for pain and nausea. Call bell within reach. Will continue to monitor patient.

## 2014-07-23 LAB — CBC AND DIFFERENTIAL
Basophils %: 0.3 % (ref 0.0–3.0)
Basophils Absolute: 0 10*3/uL (ref 0.0–0.3)
Eosinophils %: 0.6 % (ref 0.0–7.0)
Eosinophils Absolute: 0.1 10*3/uL (ref 0.0–0.8)
Hematocrit: 37.6 % (ref 36.0–48.0)
Hemoglobin: 12.4 gm/dL (ref 12.0–16.0)
Lymphocytes Absolute: 1.5 10*3/uL (ref 0.6–5.1)
Lymphocytes: 13.5 % — ABNORMAL LOW (ref 15.0–46.0)
MCH: 36 pg — ABNORMAL HIGH (ref 28–35)
MCHC: 33 gm/dL (ref 32–36)
MCV: 108 fL — ABNORMAL HIGH (ref 80–100)
MPV: 6.9 fL (ref 6.0–10.0)
Monocytes Absolute: 0.8 10*3/uL (ref 0.1–1.7)
Monocytes: 7.3 % (ref 3.0–15.0)
Neutrophils %: 78.3 % — ABNORMAL HIGH (ref 42.0–78.0)
Neutrophils Absolute: 8.7 10*3/uL — ABNORMAL HIGH (ref 1.7–8.6)
PLT CT: 298 10*3/uL (ref 130–440)
RBC: 3.46 10*6/uL — ABNORMAL LOW (ref 3.80–5.00)
RDW: 13 % (ref 11.0–14.0)
WBC: 11.1 10*3/uL — ABNORMAL HIGH (ref 4.0–11.0)

## 2014-07-23 LAB — HEPATIC FUNCTION PANEL
ALT: 27 U/L (ref 0–55)
AST (SGOT): 23 U/L (ref 10–42)
Albumin/Globulin Ratio: 0.91 Ratio (ref 0.70–1.50)
Albumin: 3.2 gm/dL — ABNORMAL LOW (ref 3.5–5.0)
Alkaline Phosphatase: 117 U/L (ref 40–145)
Bilirubin Direct: 0.2 mg/dL (ref 0.0–0.3)
Bilirubin, Total: 0.5 mg/dL (ref 0.1–1.2)
Globulin: 3.5 gm/dL (ref 2.0–4.0)
Protein, Total: 6.7 gm/dL (ref 6.0–8.3)

## 2014-07-23 LAB — VH URINALYSIS WITH MICROSCOPIC
Bilirubin, UA: NEGATIVE
Blood, UA: NEGATIVE
Glucose, UA: NEGATIVE mg/dL
Ketones UA: 5 mg/dL
Leukocyte Esterase, UA: NEGATIVE Leu/uL
Nitrite, UA: NEGATIVE
Protein, UR: NEGATIVE mg/dL
RBC, UA: 1 /hpf (ref 0–5)
Squam Epithel, UA: 1 /hpf (ref 0–2)
Urine Specific Gravity: 1.008 (ref 1.001–1.040)
Urobilinogen, UA: NORMAL mg/dL
WBC, UA: <1 /hpf (ref 0–4)
pH, Urine: 7 pH (ref 5.0–8.0)

## 2014-07-23 LAB — BASIC METABOLIC PANEL
Anion Gap: 13.9 mMol/L (ref 7.0–18.0)
BUN / Creatinine Ratio: 8.8 Ratio — ABNORMAL LOW (ref 10.0–30.0)
BUN: 6 mg/dL — ABNORMAL LOW (ref 7–22)
CO2: 20.8 mMol/L (ref 20.0–30.0)
Calcium: 9 mg/dL (ref 8.5–10.5)
Chloride: 104 mMol/L (ref 98–110)
Creatinine: 0.68 mg/dL (ref 0.60–1.20)
EGFR: >60 mL/min/{1.73_m2}
Glucose: 115 mg/dL — ABNORMAL HIGH (ref 70–99)
Osmolality Calc: 269 mOsm/kg — ABNORMAL LOW (ref 275–300)
Potassium: 3.7 mMol/L (ref 3.5–5.3)
Sodium: 135 mMol/L — ABNORMAL LOW (ref 136–147)

## 2014-07-23 LAB — LIPASE: Lipase: 233 U/L — ABNORMAL HIGH (ref 8–78)

## 2014-07-23 MED ORDER — VITAMIN B-1 100 MG PO TABS
100.0000 mg | ORAL_TABLET | Freq: Every day | ORAL | Status: DC
Start: 2014-07-23 — End: 2014-07-24
  Administered 2014-07-24: 100 mg via ORAL
  Filled 2014-07-23 (×2): qty 1

## 2014-07-23 MED ORDER — VH HYDROMORPHONE HCL PF 1 MG/ML CARPUJECT
1.0000 mg | INTRAMUSCULAR | Status: DC | PRN
Start: 2014-07-23 — End: 2014-07-24
  Administered 2014-07-23 – 2014-07-24 (×4): 1 mg via INTRAVENOUS
  Filled 2014-07-23 (×5): qty 1

## 2014-07-23 NOTE — Progress Note - Problem Oriented Charting Notewrit (Signed)
Pt having intermittent nausea with vomiting through evening, medicated with prn mar for nausea, c/o abdominal pain, medicated with prn mar x2

## 2014-07-23 NOTE — Progress Notes (Signed)
Rested at intervals. Resp unlabored. IVF maintained. Medicated for abd pain and nausea as needed. No distress.

## 2014-07-23 NOTE — Progress Notes (Signed)
PROGRESS NOTE - [facility]    Date Time: 07/23/2014 10:33 PM  Patient Name: [name]  Attending Physician: Darra Lis, MD    Assessment and Plan:                                                                                       [facility]     Active Problems:    Acute recurrent pancreatitis    Recurrent acute pancreatitis    Acute pancreatitis, recurrent   Patient still has abdominal pain requiring IV narcotic analgesics. Still has nausea and episodes of dry heaves and/or vomiting. Continue IV hydration. Continue supportive care.    Intractable nausea and episodes of vomiting. Probably due to acute pancreatitis. Urinalysis is sent and not suggestive of UTI. Will treat symptomatically with antiemetics.    HTN  Continue Norvasc 10mg  daily and Labetalol 200mg  bid with holding parameters.    GERD  PPI continued.     COPD/Asthma  No e/o acute exacerbation clinically  Duoneb prn ordered.    Ongoing tobacco abuse  Pt now down to 2-3 cigarettes per day.  Cessation strongly advised.    Hypokalemia  Supplemented and corrected.       DVT prophylaxis  Lovenox sc 40mg  daily.    Lines:                                                                                                  [facility]     Patient Lines/Drains/Airways Status    Active PICC Line / CVC Line / PIV Line / Drain / Airway / Intraosseous Line / Epidural Line / ART Line / Line / Wound / Pressure Ulcer / NG/OG Tube     Name:   Placement date:   Placement time:   Site:   Days:    Peripheral IV 07/21/14 Left Antecubital  07/21/14   0340   Antecubital   1                Disposition:                                                                                        [facility]     Today's date: 07/23/2014  Length of Stay: 2    Anticipated medical stability for discharge in : 2 days    Subjective  [facility]      CC: Follow-up acute pancreatitis    Review of Systems:                                                                           [facility]     Constitutional: Negative for fever and chills.     HEENT: Negative for nosebleeds and sore throat.      Respiratory: Negative for cough, sputum production, shortness of breath or wheezing.      Cardiovascular: No dyspnea, orthopnea or paroxysmal nocturnal dyspnea.     Gastrointestinal: Has abdominal pain, nausea, .     Genitourinary: Negative for dysuria.     Musculoskeletal: Negative for myalgias or joint pain.     Skin: Negative for rash.     Neurological: Negative for dizziness or headache.  No complaint of focal weakness or numbness    Physical Exam:                                                                                   [facility]   Temp:  [98.6 F (37 C)-99.8 F (37.7 C)] 98.6 F (37 C)  Heart Rate:  [80-95] 80  Resp Rate:  [18-20] 20  BP: (146-153)/(98-100) 146/100 mmHg    Intake/Output Summary (Last 24 hours) at 07/23/14 2233  Last data filed at 07/23/14 1900   Gross per 24 hour   Intake    450 ml   Output    900 ml   Net   -450 ml       General: awake, alert, oriented x 3; in no acute distress.    HEENT: Pupils are equal and reactive, no pallor, no icterus, moist tongue and buccal mucosa. Neck is supple, trachea is midline, no lymphadenopathy.    Cardiovascular: S1 and S2 well heard. No gallop and no friction rub. No jugular venous distention.    Lungs:  No use of accessory muscles of respiration. Lung fields are clear to auscultation bilaterally.    Abdomen: soft, positive epigastric and midabdominal tenderness. No rebound no guarding. Abdomen is non-distended; no palpable mass, no hepatosplenomegaly, bowel sounds well heard.    Extremities: No gross joint deformity. No clubbing and no acrocyanosis.    Neuro: Awake alert oriented 3. Cranial nerves grossly intact. No focal weakness or numbness. No gross lateralizing  signs.    Skin: no rashes or lesions noted.    Meds:  [facility]     Medications were reviewed in the electronic record: [x]     Labs and Imaging:                                                                             [facility]     Results     Procedure Component Value Units Date/Time    Urinalysis with Microscopic [[id number]]  (Abnormal) Collected:  07/23/14 1900    Specimen Information:  Urine, Random Updated:  07/23/14 1931     Color, UA Straw      Clarity, UA Slightly Cloudy (A)      Specific Gravity, UR 1.008      pH, Urine 7.0 pH      Protein, UR Negative mg/dL      Glucose, UA Negative mg/dL      Ketones UA 5 mg/dL      Bilirubin, UA Negative      Blood, UA Negative      Nitrite, UA Negative      Urobilinogen, UA Normal mg/dL      Leukocyte Esterase, UA Negative Leu/uL      UR Micro Performed      WBC, UA <1 /hpf      RBC, UA 1 /hpf      Bacteria, UA Rare (A) /hpf      Squam Epithel, UA 1 /hpf     Urine Culture [[id number]] Collected:  07/23/14 1900    Specimen Information:  Urine / Clean Catch Updated:  07/23/14 1925    Narrative:      Specimen/Source: Urine Specimens/Clean Catch  Collected: 07/23/2014 19:00     Status: Valued      Last Updated: 07/23/2014 19:25                Culture Result (Prelim)      Culture In Progress          CBC and differential [[id number]]  (Abnormal) Collected:  07/23/14 0549    Specimen Information:  Blood / Blood Updated:  07/23/14 0658     WBC 11.1 (H) K/cmm      RBC 3.46 (L) M/cmm      Hemoglobin 12.4 gm/dL      Hematocrit 08.6 %      MCV 108 (H) fL      MCH 36 (H) pg      MCHC 33 gm/dL      RDW 57.8 %      PLT CT 298 K/cmm      MPV 6.9 fL      NEUTROPHIL % 78.3 (H) %      Lymphocytes 13.5 (L) %      Monocytes 7.3 %      Eosinophils % 0.6 %      Basophils % 0.3 %      Neutrophils Absolute 8.7 (H) K/cmm      Lymphocytes Absolute 1.5 K/cmm      Monocytes Absolute  0.8 K/cmm      Eosinophils Absolute 0.1 K/cmm      BASO Absolute 0.0 K/cmm      RBC Morphology RBC Morphology Reviewed      Macrocytic 2+  Anisocytosis 1+     Hepatic function panel (LFT) [[id number]]  (Abnormal) Collected:  07/23/14 0549    Specimen Information:  Plasma Updated:  07/23/14 0634     Protein, Total 6.7 gm/dL      Albumin 3.2 (L) gm/dL      Alkaline Phosphatase 117 U/L      ALT 27 U/L      AST (SGOT) 23 U/L      Bilirubin, Total 0.5 mg/dL      Bilirubin, Direct 0.2 mg/dL      Albumin/Globulin Ratio 0.91 Ratio      Globulin 3.5 gm/dL     Lipase [[id number]]  (Abnormal) Collected:  07/23/14 0549    Specimen Information:  Plasma Updated:  07/23/14 0634     Lipase 233 (H) U/L     Basic Metabolic Panel [[id number]]  (Abnormal) Collected:  07/23/14 0549    Specimen Information:  Plasma Updated:  07/23/14 0634     Sodium 135 (L) mMol/L      Potassium 3.7 mMol/L      Chloride 104 mMol/L      CO2 20.8 mMol/L      CALCIUM 9.0 mg/dL      Glucose 086 (H) mg/dL      Creatinine 5.78 mg/dL      BUN 6 (L) mg/dL      Anion Gap 46.9 mMol/L      BUN/Creatinine Ratio 8.8 (L) Ratio      EGFR >60 mL/min/1.47m2      Osmolality Calc 269 (L) mOsm/kg           Microbiology, reviewed and are significant for:  Microbiology Results     None          Imaging, reviewed and are significant for:  Radiology Results (24 Hour)     ** No results found for the last 24 hours. **          I have spent 35 minutes with the patient, discussing , of which greater than 50% of the time was spent counseling and coordinating care.    Signed by: Darra Lis, MD    [facility]  [address]  [place], [place]

## 2014-07-23 NOTE — Progress Notes (Signed)
Pt voided 300 ml yellow urine. Sample sent to lab. Pt resting in bed,will monitor.

## 2014-07-23 NOTE — Progress Notes (Signed)
Pt given 1 mg iv dilaudid for c/o abd pain 8/10. No other needs stated at this time, bed alarm set, will continue to monitor pt.

## 2014-07-23 NOTE — Progress Notes (Signed)
Awake. Restless. Resp unlabored. O2 2L nc on.Temp 99.8 orally. VSS. IVF infusing. Medicated for abd pain. Using k-pad PRN. No distress. Will monitor.

## 2014-07-23 NOTE — Progress Notes (Signed)
Pt assessment completed. Pt complaining that nausea still present. Pt medicated with iv compazine and given oxycodone for pain of 6/10 in abdomen.

## 2014-07-24 DIAGNOSIS — E876 Hypokalemia: Secondary | ICD-10-CM | POA: Diagnosis present

## 2014-07-24 LAB — POTASSIUM: Potassium: 3.4 mMol/L — ABNORMAL LOW (ref 3.5–5.3)

## 2014-07-24 LAB — BASIC METABOLIC PANEL
Anion Gap: 12.7 mMol/L (ref 7.0–18.0)
BUN / Creatinine Ratio: 8.1 Ratio — ABNORMAL LOW (ref 10.0–30.0)
BUN: 5 mg/dL — ABNORMAL LOW (ref 7–22)
CO2: 22.5 mMol/L (ref 20.0–30.0)
Calcium: 8.3 mg/dL — ABNORMAL LOW (ref 8.5–10.5)
Chloride: 102 mMol/L (ref 98–110)
Creatinine: 0.62 mg/dL (ref 0.60–1.20)
EGFR: >60 mL/min/{1.73_m2}
Glucose: 103 mg/dL — ABNORMAL HIGH (ref 70–99)
Osmolality Calc: 266 mOsm/kg — ABNORMAL LOW (ref 275–300)
Potassium: 3.2 mMol/L — ABNORMAL LOW (ref 3.5–5.3)
Sodium: 134 mMol/L — ABNORMAL LOW (ref 136–147)

## 2014-07-24 LAB — CBC AND DIFFERENTIAL
Basophils %: 0.2 % (ref 0.0–3.0)
Basophils Absolute: 0 10*3/uL (ref 0.0–0.3)
Eosinophils %: 0.8 % (ref 0.0–7.0)
Eosinophils Absolute: 0.1 10*3/uL (ref 0.0–0.8)
Hematocrit: 34.5 % — ABNORMAL LOW (ref 36.0–48.0)
Hemoglobin: 11.4 gm/dL — ABNORMAL LOW (ref 12.0–16.0)
Lymphocytes Absolute: 1 10*3/uL (ref 0.6–5.1)
Lymphocytes: 12.3 % — ABNORMAL LOW (ref 15.0–46.0)
MCH: 36 pg — ABNORMAL HIGH (ref 28–35)
MCHC: 33 gm/dL (ref 32–36)
MCV: 108 fL — ABNORMAL HIGH (ref 80–100)
MPV: 7.2 fL (ref 6.0–10.0)
Monocytes Absolute: 0.9 10*3/uL (ref 0.1–1.7)
Monocytes: 11.2 % (ref 3.0–15.0)
Neutrophils %: 75.5 % (ref 42.0–78.0)
Neutrophils Absolute: 5.9 10*3/uL (ref 1.7–8.6)
PLT CT: 253 10*3/uL (ref 130–440)
RBC: 3.19 10*6/uL — ABNORMAL LOW (ref 3.80–5.00)
RDW: 13 % (ref 11.0–14.0)
WBC: 7.8 10*3/uL (ref 4.0–11.0)

## 2014-07-24 LAB — HEPATIC FUNCTION PANEL
ALT: 21 U/L (ref 0–55)
AST (SGOT): 19 U/L (ref 10–42)
Albumin/Globulin Ratio: 0.78 Ratio (ref 0.70–1.50)
Albumin: 2.5 gm/dL — ABNORMAL LOW (ref 3.5–5.0)
Alkaline Phosphatase: 92 U/L (ref 40–145)
Bilirubin Direct: 0.2 mg/dL (ref 0.0–0.3)
Bilirubin, Total: 0.6 mg/dL (ref 0.1–1.2)
Globulin: 3.2 gm/dL (ref 2.0–4.0)
Protein, Total: 5.7 gm/dL — ABNORMAL LOW (ref 6.0–8.3)

## 2014-07-24 LAB — LIPASE: Lipase: 129 U/L — ABNORMAL HIGH (ref 8–78)

## 2014-07-24 LAB — VITAMIN B12 AND FOLATE
Folate: 8.9 ng/mL (ref 7.0–19.9)
Vitamin B-12: 388 pg/mL (ref 213–816)

## 2014-07-24 MED ORDER — OXYCODONE HCL 5 MG PO TABS
5.0000 mg | ORAL_TABLET | Freq: Four times a day (QID) | ORAL | Status: DC | PRN
Start: 2014-07-24 — End: 2015-01-01

## 2014-07-24 MED ORDER — POTASSIUM CHLORIDE CRYS ER 20 MEQ PO TBCR
20.0000 meq | EXTENDED_RELEASE_TABLET | Freq: Two times a day (BID) | ORAL | Status: DC
Start: 2014-07-25 — End: 2018-10-22

## 2014-07-24 MED ORDER — ONDANSETRON 4 MG PO TBDP
4.0000 mg | ORAL_TABLET | Freq: Three times a day (TID) | ORAL | Status: DC | PRN
Start: 2014-07-24 — End: 2015-01-01

## 2014-07-24 MED ORDER — POTASSIUM CHLORIDE CRYS ER 20 MEQ PO TBCR
40.0000 meq | EXTENDED_RELEASE_TABLET | Freq: Once | ORAL | Status: AC
Start: 2014-07-24 — End: 2014-07-24
  Administered 2014-07-24: 40 meq via ORAL
  Filled 2014-07-24: qty 2

## 2014-07-24 MED ORDER — POTASSIUM CHLORIDE CRYS ER 20 MEQ PO TBCR
40.0000 meq | EXTENDED_RELEASE_TABLET | Freq: Once | ORAL | Status: AC
Start: 2014-07-24 — End: 2014-07-24
  Administered 2014-07-24: 40 meq via ORAL
  Filled 2014-07-24 (×2): qty 2

## 2014-07-24 MED ORDER — POTASSIUM CHLORIDE CRYS ER 20 MEQ PO TBCR
20.0000 meq | EXTENDED_RELEASE_TABLET | Freq: Two times a day (BID) | ORAL | Status: DC
Start: 2014-07-25 — End: 2014-07-24

## 2014-07-24 MED ORDER — VITAMIN B-1 100 MG PO TABS
100.0000 mg | ORAL_TABLET | Freq: Every day | ORAL | Status: DC
Start: 2014-07-24 — End: 2015-11-23

## 2014-07-24 NOTE — Discharge Summary (Signed)
DISCHARGE SUMMARY - [facility]    Patient Name: [name]  Attending Physician: Darra Lis, MD  Primary Care Physician: Cain Saupe, MD    Date of Admission: 07/21/2014  Date of Discharge: 07/24/2014  Length of Stay in the Hospital: 3    Discharge Diagnoses:                                                                      [facility] Problems    Diagnosis POA   . Asthma Yes   . ETOH abuse Yes   . Tobacco use Not Applicable   . Essential hypertension Yes      Resolved Hospital Problems    Diagnosis POA   . Principal Problem: Acute recurrent pancreatitis Yes   . Acute hypokalemia Yes       Discharge Condition: Stable and improved.        Admission H&P summary:                                                                 [facility]   For detailed presentng complaints and findings at admission, please refer to H & P  Of this admission.       Hospital Course:                                                                                [facility]     Brief presenting complaint and course of Hospital stay:    History of present illness at admission.   [name] is a 54 y.o. female who presents to the hospital with 3 days hx of worsening epigastric pain. Pt has hx of recurrent ETOH pancreatitis. She denies any heavy ETOH use since her last admission for pancreatitis less a month ago. Pt reports just having one alcoholic drink last week.     Pt tried to tx her abd pain by resting her bowel and with po pain medications. However, abd pain just got worse and pt felt very weak and dry. Therefore she decided to seek medical help by coming to the ED tonight.     Denies any f/c but felt hot and cold at times. No n/v/diarrhea. No cp/sob/cough/wheezing. No leg pain or swelling.     Course of Hospital stay.  Patient was admitted with diagnosis of acute pancreatitis. Was given IV hydration and put on clear liquid diet. She  was given narcotic analgesia for abdominal pain. She had severe hypokalemia which was supplemented and initially corrected to 3.7 on 07/23/2014. On 07/24/2014 potassium deeped to 3.2 this was also supplemented and repeat potassium level was 3.4. Patient was sent on oral potassium supplement  and outpatient repeat serum potassium was done on 07/29/2014 and it was 2.4. On 07/30/2014 patient had visit with her PCP Dr. Mel Almond. I called Dr. Isaac Laud office and I was informed that her hypokalemia was addressed by her PCP on 07/30/2014.    Patient improved from day to day and on day of discharge she was feeling much better   Patient was discharged stable and improved.          Discharge Day Exam:  Temp:  [98 F (36.7 C)-98.4 F (36.9 C)] 98.1 F (36.7 C)  Heart Rate:  [68-78] 68  Resp Rate:  [19-20] 20  BP: (114-136)/(61-92) 114/61 mmHg  Wt Readings from Last 3 Encounters:   07/21/14 82.6 kg (182 lb 1.6 oz)   07/03/14 84.1 kg (185 lb 6.5 oz)   04/23/14 85.276 kg (188 lb)       General appearance - alert, well appearing, and in no distress  Eyes - Pink conjunctiva, sclera anicteric, pupils equal and reactive, extraocular eye movements intact  Ears -. Normal hearing  Mouth - mucous membranes moist, pharynx normal without lesions and tonsils normal  Neck - supple, no significant adenopathy  Lymphatics - no palpable lymphadenopathy, no hepatosplenomegaly  Chest - Lung fields are  clear to auscultation. No use of accessory muscles of respiration. No cyanosis and no clubing  CVS - Heart normal rate, regular rhythm, normal S1, S2, no  rubs, clicks or gallops. No jugular vein distention.  peripheral pulses normal, no pedal edema, no clubbing or cyanosis.  Abdomen - soft, nontender, nondistended,Bowel sounds well heard, no masses or organomegaly  Musculoskeletal - no gross joint tenderness, deformity or swelling -  Integumentary system - Skin  normal coloration and turgor, no rashes, no suspicious skin lesions noted  Neurological -  alert, oriented x 3, normal speech,Cranial nerves grossly intact, no lateralizing signs. no focal findings or movement disorder noted.      Discharge Medications:                                                                    [facility]        Medication List      START taking these medications          ondansetron 4 MG disintegrating tablet   Commonly known as:  ZOFRAN ODT   Take 1 tablet (4 mg total) by mouth every 8 (eight) hours as needed for Nausea.       potassium chloride 20 MEQ tablet   Commonly known as:  K-DUR,KLOR-CON   Take 1 tablet (20 mEq total) by mouth 2 (two) times daily.   Start taking on:  07/25/2014       vitamin B-1 100 MG tablet   Commonly known as:  THIAMINE   Take 1 tablet (100 mg total) by mouth daily.         CONTINUE taking these medications          amLODIPine 10 MG tablet   Commonly known as:  NORVASC       diphenhydrAMINE 25 MG tablet   Commonly known as:  BENADRYL   Take 1 tablet (25 mg total) by mouth nightly as needed for Sleep.       labetalol 100  MG tablet   Commonly known as:  NORMODYNE       multivitamin with minerals tablet       oxyCODONE 5 MG immediate release tablet   Commonly known as:  ROXICODONE   Take 1 tablet (5 mg total) by mouth every 6 (six) hours as needed.       pantoprazole 40 MG tablet   Commonly known as:  PROTONIX       promethazine 12.5 MG tablet   Commonly known as:  PHENERGAN   Take 1 tablet (12.5 mg total) by mouth every 6 (six) hours as needed for Nausea.         Where to Get Your Medications     You need to pick up these prescriptions. We sent some of them to a specific pharmacy. Go to these places to get your medications.           [facility] - [place] ([address]), [place] - [phone]   -  vitamin B-1 098 MG tablet    [address]   [place] ([place]) [place] [address]   Phone:  [phone]              You may get the following medications from any pharmacy   -  ondansetron 4 MG disintegrating tablet   -  oxyCODONE 5 MG immediate release  tablet   -  potassium chloride 20 MEQ tablet                          Discharge Instructions:                                                                   [facility]      Diet: Heart healthy diet.    Activity: As tolerated.     Patient was instructed to follow up with:   Primary Care Doctor Cain Saupe, MD in  3 days.     Discharge Code Status: Full Code    Complete instructions and follow up are in the patient's After Visit Summary (AVS).      Consultations:                                                                                    [facility]     Treatment Team: Attending Provider: Darra Lis, MD; Registered Nurse: Abbey Chatters, RN; Registered Nurse: York Cerise, RN; Registered Nurse: Bowen, Lise Auer, RN    Procedures/Radiology performed:                                                 [facility]     CT ABDOMEN PELVIS W IV/ WO PO CONT      No orders of the defined  types were placed in this encounter.       Discharge Condition:                                                                        [facility]     The patient was discharged in stable condition.  Time spent coordinating discharge and reviewing discharge plan:  35 minutes      Signed by: Darra Lis, MD    [facility]  [address]  [place], [place]    CC: Cain Saupe, MD

## 2014-07-24 NOTE — Plan of Care (Signed)
Problem: Health Promotion  Goal: Vaccination Screening  All patients will be screened for current vaccination status on each admission.   Outcome: Completed Date Met:  07/24/14  Goal: Knowledge - health resources  Extent of understanding and conveyed about healthcare resources.   Outcome: Completed Date Met:  07/24/14  Pt plans to return to private residence with roommates.     Problem: Safety  Goal: Patient will be free from injury during hospitalization  Intervention: Hourly rounding.  Hourly rounding performed by staff.

## 2014-07-24 NOTE — Progress Notes (Signed)
Pt has been discharged. Awaiting ride home. K+ 3.4 order for KDUR 40 meq x 1 received to give before discharge. On room air no distress noted. Denies pain.

## 2014-07-24 NOTE — Progress Notes (Signed)
Rested at intervals. Resp unlabored. IVF maintained. Medicated for abd pain as needed. No N/V. No distress.

## 2014-07-24 NOTE — Progress Notes (Signed)
Assumed care of pt at 0700. Pt sitting up in bed watching tv. Denies pain/needs/concerns. Will monitor. CB in reach.

## 2014-07-24 NOTE — Progress Notes (Signed)
Awake watching TV. Resp unlabored. O2 2L nc on. VSS. IVF infusing. Medicated for abd pain. Using k-pad PRN. No distress. Will monitor.

## 2014-07-24 NOTE — Progress Notes (Signed)
Discharge instructions gone over and given to patient as well as scripts. Discharge home via wheelchair with family member. No distress or discomfort observed.

## 2014-07-29 ENCOUNTER — Ambulatory Visit
Admission: RE | Admit: 2014-07-29 | Discharge: 2014-07-29 | Disposition: A | Discharge status: Home / Self Care | Payer: Commercial Managed Care - POS | Source: Ambulatory Visit

## 2014-07-29 ENCOUNTER — Ambulatory Visit
Admission: RE | Admit: 2014-07-29 | Discharge: 2014-07-29 | Disposition: A | Discharge status: Home / Self Care | Payer: Commercial Managed Care - POS | Source: Ambulatory Visit | Attending: Internal Medicine | Admitting: Internal Medicine

## 2014-07-29 DIAGNOSIS — I10 Essential (primary) hypertension: Secondary | ICD-10-CM | POA: Insufficient documentation

## 2014-07-29 DIAGNOSIS — E876 Hypokalemia: Secondary | ICD-10-CM

## 2014-07-29 LAB — BASIC METABOLIC PANEL
Anion Gap: 18.8 mMol/L — ABNORMAL HIGH (ref 7.0–18.0)
BUN / Creatinine Ratio: 6.7 Ratio — ABNORMAL LOW (ref 10.0–30.0)
BUN: 6 mg/dL — ABNORMAL LOW (ref 7–22)
CO2: 20.6 mMol/L (ref 20.0–30.0)
Calcium: 9.1 mg/dL (ref 8.5–10.5)
Chloride: 99 mMol/L (ref 98–110)
Creatinine: 0.9 mg/dL (ref 0.60–1.20)
EGFR: >60 mL/min/{1.73_m2}
Glucose: 94 mg/dL (ref 70–99)
Osmolality Calc: 269 mOsm/kg — ABNORMAL LOW (ref 275–300)
Potassium: 2.4 mMol/L — CL (ref 3.5–5.3)
Sodium: 136 mMol/L (ref 136–147)

## 2014-07-29 LAB — TSH: TSH: 0.89 u[IU]/mL (ref 0.40–4.20)

## 2014-07-29 LAB — CBC AND DIFFERENTIAL
Basophils %: 1 % (ref 0.0–3.0)
Basophils Absolute: 0.1 10*3/uL (ref 0.0–0.3)
Eosinophils %: 1.9 % (ref 0.0–7.0)
Eosinophils Absolute: 0.2 10*3/uL (ref 0.0–0.8)
Hematocrit: 40.9 % (ref 36.0–48.0)
Hemoglobin: 14.3 gm/dL (ref 12.0–16.0)
Lymphocytes Absolute: 2.5 10*3/uL (ref 0.6–5.1)
Lymphocytes: 30.3 % (ref 15.0–46.0)
MCH: 36 pg — ABNORMAL HIGH (ref 28–35)
MCHC: 35 gm/dL (ref 32–36)
MCV: 103 fL — ABNORMAL HIGH (ref 80–100)
MPV: 6.3 fL (ref 6.0–10.0)
Monocytes Absolute: 0.6 10*3/uL (ref 0.1–1.7)
Monocytes: 7.2 % (ref 3.0–15.0)
Neutrophils %: 59.7 % (ref 42.0–78.0)
Neutrophils Absolute: 5 10*3/uL (ref 1.7–8.6)
PLT CT: 442 10*3/uL — ABNORMAL HIGH (ref 130–440)
RBC: 3.96 10*6/uL (ref 3.80–5.00)
RDW: 13.1 % (ref 11.0–14.0)
WBC: 8.3 10*3/uL (ref 4.0–11.0)

## 2014-07-29 LAB — COMPREHENSIVE METABOLIC PANEL
ALT: 33 U/L (ref 0–55)
AST (SGOT): 36 U/L (ref 10–42)
Albumin/Globulin Ratio: 0.86 Ratio (ref 0.70–1.50)
Albumin: 3.1 gm/dL — ABNORMAL LOW (ref 3.5–5.0)
Alkaline Phosphatase: 113 U/L (ref 40–145)
Anion Gap: 20.2 mMol/L — ABNORMAL HIGH (ref 7.0–18.0)
BUN / Creatinine Ratio: 6.8 Ratio — ABNORMAL LOW (ref 10.0–30.0)
BUN: 6 mg/dL — ABNORMAL LOW (ref 7–22)
Bilirubin, Total: 0.3 mg/dL (ref 0.1–1.2)
CO2: 20.2 mMol/L (ref 20.0–30.0)
Calcium: 9.1 mg/dL (ref 8.5–10.5)
Chloride: 99 mMol/L (ref 98–110)
Creatinine: 0.88 mg/dL (ref 0.60–1.20)
EGFR: >60 mL/min/{1.73_m2}
Globulin: 3.6 gm/dL (ref 2.0–4.0)
Glucose: 97 mg/dL (ref 70–99)
Osmolality Calc: 271 mOsm/kg — ABNORMAL LOW (ref 275–300)
Potassium: 2.4 mMol/L — CL (ref 3.5–5.3)
Protein, Total: 6.7 gm/dL (ref 6.0–8.3)
Sodium: 137 mMol/L (ref 136–147)

## 2014-07-29 LAB — LIPID PANEL
Cholesterol: 169 mg/dL (ref 75–199)
Coronary Heart Disease Risk: 5.12
HDL: 33 mg/dL — ABNORMAL LOW (ref 45–65)
LDL Calculated: 75 mg/dL
Triglycerides: 307 mg/dL — ABNORMAL HIGH (ref 10–150)
VLDL: 61 — ABNORMAL HIGH (ref 0–40)

## 2014-08-12 ENCOUNTER — Ambulatory Visit
Admission: RE | Admit: 2014-08-12 | Discharge: 2014-08-12 | Disposition: A | Discharge status: Home / Self Care | Payer: Commercial Managed Care - POS | Source: Ambulatory Visit | Attending: Family Medicine | Admitting: Family Medicine

## 2014-08-12 DIAGNOSIS — E876 Hypokalemia: Secondary | ICD-10-CM | POA: Insufficient documentation

## 2014-08-12 LAB — POTASSIUM: Potassium: 4.5 mMol/L (ref 3.5–5.3)

## 2014-12-21 ENCOUNTER — Emergency Department: Payer: No Typology Code available for payment source

## 2014-12-21 ENCOUNTER — Emergency Department
Admission: EM | Admit: 2014-12-21 | Discharge: 2014-12-21 | Disposition: A | Discharge status: Home / Self Care | Payer: Worker's Comp, Other unspecified | Attending: Emergency Medicine | Admitting: Emergency Medicine

## 2014-12-21 DIAGNOSIS — W228XXA Striking against or struck by other objects, initial encounter: Secondary | ICD-10-CM | POA: Insufficient documentation

## 2014-12-21 DIAGNOSIS — S0181XA Laceration without foreign body of other part of head, initial encounter: Secondary | ICD-10-CM | POA: Insufficient documentation

## 2014-12-21 MED ORDER — LIDOCAINE-EPINEPHRINE 1 %-1:100000 IJ SOLN
INTRAMUSCULAR | Status: AC
Start: 2014-12-21 — End: ?
  Filled 2014-12-21: qty 20

## 2014-12-21 MED ORDER — LIDOCAINE-EPINEPHRINE 1 %-1:100000 IJ SOLN
1.0000 mL | Freq: Once | INTRAMUSCULAR | Status: AC
Start: 2014-12-21 — End: 2014-12-21
  Administered 2014-12-21: 1 mL via INTRADERMAL

## 2014-12-21 NOTE — ED Provider Notes (Signed)
[facility] EMERGENCY DEPARTMENT   History and Physical Exam      Patient Name: [name], [name]  Encounter Date:  12/21/2014  Attending Physician: Richardo Hanks, MD  PCP: Cain Saupe, MD  Patient DOB:  05/03/1960  MRN:  [id number]  Room:  C11/C11-A      History of Presenting Illness     Chief complaint: Head Injury    HPI/ROS is limited by: none  HPI/ROS given by: patient    Location: left forehead  Duration: just PTA  Severity: mild          Nursing Notes reviewed and acknowledged.    [name] is a 54 y.o. female who presents with head injury. Pt was working at [employer] this morning and hit her head on a panel of display glass that had been left open. She has a laceration to her left forehead with bleeding controlled. There was no LOC. She has a mild headache but no nausea, confusion, other symptoms. Tetanus is UTD. Glass was intact.      Review of Systems     Patient reports they have otherwise been in their normal state of health except as per HPI    Allergies     Pt is allergic to percocet.    Medications     Current Outpatient Rx   Name  Route  Sig  Dispense  Refill   . amLODIPine (NORVASC) 10 MG tablet    Oral    Take 10 mg by mouth daily.             Marland Kitchen labetalol (NORMODYNE) 100 MG tablet    Oral    Take 200 mg by mouth 2 (two) times daily. Take two 100 mg tablets for a total dose of 200 mg every 12 hours               . Multiple Vitamins-Minerals (MULTIVITAMIN WITH MINERALS) tablet    Oral    Take 1 tablet by mouth daily.             . pantoprazole (PROTONIX) 40 MG tablet    Oral    Take 40 mg by mouth daily.             . potassium chloride (K-DUR,KLOR-CON) 20 MEQ tablet    Oral    Take 1 tablet (20 mEq total) by mouth 2 (two) times daily.    10 tablet    0     . diphenhydrAMINE (BENADRYL) 25 MG tablet    Oral    Take 1 tablet (25 mg total) by mouth nightly as needed for Sleep.        0     . ondansetron (ZOFRAN ODT) 4 MG disintegrating tablet    Oral    Take 1 tablet (4 mg total) by  mouth every 8 (eight) hours as needed for Nausea.    12 tablet    0     . oxyCODONE (ROXICODONE) 5 MG immediate release tablet    Oral    Take 1 tablet (5 mg total) by mouth every 6 (six) hours as needed.    30 tablet    0     . promethazine (PHENERGAN) 12.5 MG tablet    Oral    Take 1 tablet (12.5 mg total) by mouth every 6 (six) hours as needed for Nausea.    20 tablet    0     . vitamin B-1 (THIAMINE) 100 MG tablet  Oral    Take 1 tablet (100 mg total) by mouth daily.    30 tablet    0          Past Medical History     Pt has a past medical history of Hypertension; Asthma without status asthmaticus; Gastroesophageal reflux disease; Pancreatitis; DVT (deep venous thrombosis); S/P IVC filter; Clostridium difficile colitis; and H/O ETOH abuse.    Past Surgical History     Pt has past surgical history that includes Colonoscopy (12/26/2012); COLONOSCOPY, POLYPECTOMY (12/26/2012); Cholecystectomy; and Pancreatectomy (01/2011).    Family History     The family history is not on file.    Social History     Pt reports that she has been smoking Cigarettes.  She has a 7.5 pack-year smoking history. She has never used smokeless tobacco. She reports that she drinks alcohol. She reports that she does not use illicit drugs.    Physical Exam     Blood pressure 105/75, pulse 68, temperature 97.3 F (36.3 C), temperature source Oral, resp. rate 18, weight 86.3 kg, SpO2 97 %.    Constitutional: Vital signs reviewed. Well appearing, Well nourished.   Head: Normocephalic, 2 CM HORIZONTAL LAC TO LEFT MID-FOREHEAD. Bleeding controlled. No foreign bodies seen on exploration.  Eyes: PERRL EOMI. Conjunctiva and sclera are normal.  No injection or discharge.  Ears, Nose, Throat:  Airway patent. Normal external examination of the nose and ears.    Neck: Normal range of motion. Non-tender. Trachea midline.  Extremities: No cyanosis, No edema, No evidence of injury. Positive symmetric pulses with good capillary refill  Neurological:  Sensation to light touch. No focal motor deficits by observation. Speech normal. Facial symmetric.  Skin: Warm and dry. No rash.  Psychiatric: Alert and conversant.  Normal affect.  Normal insight.    Orders Placed     Orders Placed This Encounter   Procedures   . Suture Tray Set up       Diagnostic Results       The results of the diagnostic studies below have been reviewed by myself:    Labs  Results     ** No results found for the last 24 hours. **          Radiologic Studies  Radiology Results (24 Hour)     ** No results found for the last 24 hours. **                 MDM and Assessment   She was advised on wound care and that her sutures will need to be removed in 5 days. Patient will follow up with their PCP. If symptoms worsen, they will return to the ED. Patient is in agreement with this plan.          Procedures     LACERATION REPAIR   By Marsa Aris, MD  9:15 AM    Location: left forehead  Length: 2 cm  Layers: single  Contamination: clean  Suture(s): #5 x 6-0 nylon    Verbal consent.  Patient identified and location of wound verified.  Tetanus is up-to-date.  Appropriate sterile precautions observed with saline, Betadine, gloves and sterile drape.  Lidocaine 1% with epi given locally.  Wound and surrounding structures checked and no further trauma found.  Patient tolerated procedure well with no apparent complications.      Diagnosis / Disposition     Clinical Impression  1. Facial laceration, initial encounter  Disposition  ED Disposition     Discharge Felton Clinton Trulock discharge to home/self care.    Condition at disposition: Stable            Prescriptions  New Prescriptions    No medications on file     Scribe Attestation statement    I, Marsa Aris, personally performed the services documented. Reinaldo Berber is scribing for me on [place],Kaliyah LOUISE. I reviewed and confirm the accuracy of the information in this medical record.         Richardo Hanks,  MD                  Marsa Aris, MD  12/21/14 [id number]

## 2014-12-21 NOTE — Discharge Instructions (Signed)
Laceration (All Closures)  Alaceration is a cut through the skin. This will usually require stitches (sutures) or staples if it is deep. Minor cuts may be treated with a surgical tape closure orskin glue.    Home care  The following guidelines will help you care for your laceration at home:  Extremity, face, or trunk wounds   Keep the wound clean and dry. If a bandage was applied and it becomes wet or dirty, replace it. Otherwise, leave it in place for the first 24 hours.   If stitches or staples were used, clean the wound daily.   After removing the bandage, wash the area with soap and water. Use a wet cotton swab to loosen and remove any blood or crust that forms.   The doctor may prescribe an antibiotic cream or ointment to prevent infection. Do not stop taking this medication until you have finished the prescribed course or the doctor tells you to stop. The doctor may also prescribe medications for pain. Follow the doctor's instructions for taking these medications.   You may remove the bandage to shower as usual after the first 24 hours, but do not soak the area in water (no swimming) until the stitches or staples are removed.   If surgical tape was used, keep the area clean and dry. If it becomes wet, blot it dry with a towel.   If skin glue was used, do not scratch, rub, or pick at the adhesive film. Do not place tape directly over the film. Do not apply liquid, ointment, or creams to the wound while the film is in place. Do not clean the wound with peroxide and do not apply ointments. Avoid activities that cause heavy sweating until the film has fallen off. Protect the wound from prolonged exposure to sunlight or tanning lamps. You may shower as usual but do not soak the wound in water (no baths or swimming). The film will fall off by itself in 5-10 days.  Scalp wounds  During the first two days, you may carefully rinse your hair in the shower to remove blood, glass or dirt particles. After two  days, you may shower and shampoo your hair normally. Do not soak your scalp in the tub or go swimming until the stitches or staples have been removed. Talk with your doctor before applying any antibiotic ointment to the wound.  Mouth wounds  Eat soft foods to reduce pain. If the cut is inside of your mouth, clean by rinsing after each meal and at bedtime with a mixture of equal parts water and hydrogen peroxide (do not swallow!). Or, you can use a cotton swab to directly apply hydrogen peroxide onto the cut. Mouth wounds can be painful when eating. You may use an over-the-counter local numbing solution for pain relief. If this is not available, you may use any numbing solution for teething babies. You may apply this directly to the sores with a cotton-tip swab or with your finger.  Follow-up care  Follow up with your health care provider. Most skin wounds heal within ten days. Mouth and facial wounds heal within five days. However, even with proper treatment, a wound infection may sometimes occur. Therefore, you should check the wound daily for signs of infection listed below.  Stitches should be removed from the face within five days; stitches and staples should be removed from other parts of the body within 7-14 days. If dissolving stitches were used in the mouth, these will fall out or dissolve   without the need for removal. If tape closures were used, remove them yourself if they have not fallen off after 7 days. Ifskin glue was used, the film will fall off by itself in 5-10 days.  When to seek medical advice  Call your health care provider right awayif any of these occur:   Bleeding not controlled by direct pressure   Signs of infection, including increasing pain in the wound, increasing wound redness or swelling, or pus coming from the wound   Fever of 100.4F (38C) or higher, or as directed by your health care provider   Stitches or staples come apart or fall out or surgical tape falls off before 7  days   Wound edges re-open   2000-2015 [employer] [address], [place] [address]. All rights reserved. This information is not intended as a substitute for professional medical care. Always follow your healthcare professional's instructions.

## 2014-12-28 ENCOUNTER — Encounter (INDEPENDENT_AMBULATORY_CARE_PROVIDER_SITE_OTHER): Payer: Self-pay

## 2014-12-28 ENCOUNTER — Ambulatory Visit (INDEPENDENT_AMBULATORY_CARE_PROVIDER_SITE_OTHER): Payer: Commercial Managed Care - POS | Admitting: Physician Assistant

## 2014-12-28 VITALS — BP 138/102 | HR 100 | Temp 97.8°F | Resp 22 | Ht 66.0 in | Wt 182.0 lb

## 2014-12-28 DIAGNOSIS — Z4802 Encounter for removal of sutures: Secondary | ICD-10-CM

## 2014-12-28 DIAGNOSIS — R42 Dizziness and giddiness: Secondary | ICD-10-CM

## 2014-12-28 MED ORDER — ONDANSETRON 4 MG PO TBDP
4.0000 mg | ORAL_TABLET | Freq: Three times a day (TID) | ORAL | Status: DC | PRN
Start: 2014-12-28 — End: 2015-01-01

## 2014-12-28 MED ORDER — MECLIZINE HCL 25 MG PO TABS
25.0000 mg | ORAL_TABLET | Freq: Three times a day (TID) | ORAL | Status: DC | PRN
Start: 2014-12-28 — End: 2015-04-17

## 2014-12-28 NOTE — Progress Notes (Signed)
Subjective:    Patient ID: [name] is a 54 y.o. female.    Dizziness  This is a new problem. The current episode started today. The problem occurs intermittently. Exacerbated by: movement.   Otalgia   There is pain in the left ear. This is a new problem. The current episode started today. The problem occurs constantly.   Ear ringing.  Tried ibuprofen with no relief.  Has not been drinking too much water.  No abdominal pain.  No urinary complaints,  No chest pain or shortness of breath.  Been around sick contacts and had recent GI illness.    The following portions of the patient's history were reviewed and updated as appropriate: allergies, current medications, past family history, past medical history, past social history, past surgical history and problem list.    Review of Systems   HENT: Positive for ear pain.    Neurological: Positive for dizziness.         Objective:    BP 138/102 mmHg  Pulse 100  Temp(Src) 97.8 F (36.6 C) (Oral)  Resp 22  Ht 1.676 m (5\' 6" )  Wt 82.555 kg (182 lb)  BMI 29.39 kg/m2    Physical Exam   Constitutional: She is oriented to person, place, and time. She appears well-developed and well-nourished.   HENT:   Head: Normocephalic and atraumatic.   Right Ear: External ear normal.   Left Ear: External ear normal.   Mouth/Throat: Oropharynx is clear and moist.   Eyes: Conjunctivae are normal. Pupils are equal, round, and reactive to light.   Neck: Neck supple.   Cardiovascular: Normal rate and regular rhythm.    Pulmonary/Chest: Effort normal and breath sounds normal.   Neurological: She is alert and oriented to person, place, and time.   Skin: Skin is warm and dry.   Psychiatric: She has a normal mood and affect.         Assessment and Plan:       There are no diagnoses linked to this encounter.      Lab Results from today's visit:  No results found for this or any previous visit (from the past 4 hour(s)).    Radiology Results from today's visit:  No results  found.    Assessment/Plan for today's visit:  1. Vertigo       Patient with vertigo sym ptoms that started this morning.  Worse with movement and walking.  She also noted to have left sided ear ringing.  Exam is benign.  Denies chest pain, shortness of breath, palpitation, or dizziness.  I will start a trial of meclizine and zofran.  If no improvement or any worsening of symptoms go straight to ER.  Patient also mentions about one week ago she ran into a glass door and suffered laceration with stiches, wants them removed since its been one week.  I removed stitches without complications, wound healing well.    Enid Derry, [place]  [facility]  12/28/2014 3:45 PM        Patient Instructions   IF YOU HAVE ANY WORSENING OF SYMPTOMS, GO STRAIGHT TO ER.      Managing Dizziness (Vertigo) with Medications     Although medications can't cure your problem, they can help control symptoms. Your doctor may prescribe medications for a few weeks and then taper them off.     How medications can help   Treat infection or inflammation. If you have a bacterial infection, your doctor can  prescribe antibiotics.   Limit conflicting balance signals. These medications are often in pill form.   Ease nausea. Suppositories, pills, or shots may be used to reduce vomiting.   Reduce pressure in the canals. Diuretics can be used to treat Meniere's disease. These medications help rid the body of excess fluid.   Ease other symptoms. Other medications can help ease depression and anxiety caused by living with dizziness or fainting.   [address] [employer] [address] [place], [place] [address]. All rights reserved. This information is not intended as a substitute for professional medical care. Always follow your healthcare professional's instructions.            Enid Derry, [place]  [facility]  12/28/2014  3:26 PM

## 2014-12-28 NOTE — Patient Instructions (Signed)
IF YOU HAVE ANY WORSENING OF SYMPTOMS, GO STRAIGHT TO ER.      Managing Dizziness (Vertigo) with Medications     Although medications can't cure your problem, they can help control symptoms. Your doctor may prescribe medications for a few weeks and then taper them off.     How medications can help   Treat infection or inflammation. If you have a bacterial infection, your doctor can prescribe antibiotics.   Limit conflicting balance signals. These medications are often in pill form.   Ease nausea. Suppositories, pills, or shots may be used to reduce vomiting.   Reduce pressure in the canals. Diuretics can be used to treat Meniere's disease. These medications help rid the body of excess fluid.   Ease other symptoms. Other medications can help ease depression and anxiety caused by living with dizziness or fainting.   [address] [employer]. [address], [place], [place] [address]. All rights reserved. This information is not intended as a substitute for professional medical care. Always follow your healthcare professional's instructions.

## 2014-12-28 NOTE — Procedures (Signed)
Subjective:    Patient ID:   Here for Suture removal. Wound healing well. No drainage, pain, redness or fever.      ROS    The following portions of the patient's history were reviewed and updated as appropriate: allergies, current medications, past medical history, past surgical history and problem list.      Objective:     Filed Vitals:    12/28/14 1503   BP: 138/102   Pulse: 100   Temp: 97.8 F (36.6 C)   Resp: 22       Constitutional: Oriented to person, place, and time. Appears well-developed.   Head: Normocephalic.   Eyes: Conjunctivae are normal.   Pulmonary/Chest: Effort normal.   Neurological:Alert and oriented to person, place, and time.   Skin: Skin is warm and dry. Wound healing good .   Psychiatric: Normal mood and affect.                  Assessment and Plan:       The encounter diagnosis was Vertigo.    PLAN:  The suture was removed without any difficulty. Recommend to clean the site and continue monitor. Please call if any discharge or bleeding or redness or pain.      Enid Derry, [place]  [facility]  12/28/2014 3:47 PM

## 2014-12-31 ENCOUNTER — Telehealth (INDEPENDENT_AMBULATORY_CARE_PROVIDER_SITE_OTHER): Payer: Self-pay

## 2014-12-31 NOTE — Telephone Encounter (Signed)
Performed Patient call back She did not answer the phone. Left message informing patient to call back if She has any questions or concerns

## 2015-01-01 ENCOUNTER — Encounter (INDEPENDENT_AMBULATORY_CARE_PROVIDER_SITE_OTHER): Payer: Self-pay

## 2015-01-01 ENCOUNTER — Ambulatory Visit (INDEPENDENT_AMBULATORY_CARE_PROVIDER_SITE_OTHER): Payer: Commercial Managed Care - POS | Admitting: Family Medicine

## 2015-01-01 VITALS — BP 130/82 | HR 78 | Temp 98.5°F | Resp 16 | Ht 66.0 in | Wt 187.4 lb

## 2015-01-01 DIAGNOSIS — H9202 Otalgia, left ear: Secondary | ICD-10-CM

## 2015-01-01 DIAGNOSIS — R42 Dizziness and giddiness: Secondary | ICD-10-CM

## 2015-01-01 MED ORDER — CETIRIZINE HCL 10 MG PO TABS
10.0000 mg | ORAL_TABLET | Freq: Every day | ORAL | Status: DC
Start: 2015-01-01 — End: 2015-04-17

## 2015-01-01 MED ORDER — FLUTICASONE PROPIONATE 50 MCG/ACT NA SUSP
2.0000 | Freq: Every day | NASAL | Status: DC
Start: 2015-01-01 — End: 2015-04-17

## 2015-01-01 NOTE — Progress Notes (Signed)
Subjective:    Patient ID: [name] is a 54 y.o. female.    HPI  Has been having ear pain for last 2 wks after having head injury. No bleeding ear. Feel like she cannot hear anything. Has no cough or congestion. Has been taking meclizine for vertigo which is getting better now.   No nausea or vomiting.     The following portions of the patient's history were reviewed and updated as appropriate: allergies, current medications, past family history, past medical history, past social history, past surgical history and problem list.    Review of Systems   Constitutional: Negative for fever.   HENT: Positive for ear pain. Negative for congestion and rhinorrhea.    Respiratory: Negative for cough, shortness of breath and wheezing.    Cardiovascular: Negative for chest pain.   Musculoskeletal: Negative for arthralgias.   Skin: Negative for rash.   Neurological: Negative for dizziness, light-headedness and headaches.   Hematological: Does not bruise/bleed easily.         Objective:    BP 130/82 mmHg  Pulse 78  Temp(Src) 98.5 F (36.9 C) (Oral)  Resp 16  Ht 1.676 m (5\' 6" )  Wt 85.004 kg (187 lb 6.4 oz)  BMI 30.26 kg/m2    Physical Exam   Constitutional: She is oriented to person, place, and time. She appears well-developed.   HENT:   Head: Normocephalic.   Right Ear: External ear normal.   Left Ear: External ear normal.   Mouth/Throat: Oropharynx is clear and moist.   Eyes: Conjunctivae are normal.   Cardiovascular: Normal rate, regular rhythm and normal heart sounds.    Pulmonary/Chest: Effort normal and breath sounds normal. No respiratory distress. She has no wheezes.   Neurological: She is alert and oriented to person, place, and time.   Skin: Skin is warm and dry.   Psychiatric: She has a normal mood and affect.         Assessment and Plan:       [name] was seen today for ear problem and dizziness.    Diagnoses and all orders for this visit:    Left ear pain    Dizziness    Other orders  -      fluticasone (FLONASE) 50 MCG/ACT nasal spray; 2 sprays by Nasal route daily.  -     cetirizine (ZYRTEC ALLERGY) 10 MG tablet; Take 1 tablet (10 mg total) by mouth daily.      No sign of ear infection. Could be eustachian tube dysfunction. Try zyrtec, flonase and continue to monitor.   Dizziness is improved. Continue to monitor.   Need to call if no better or worst. The Patient understands and agree with the plan.            Jodelle Red, MD  [facility]  01/01/2015  7:45 PM

## 2015-01-01 NOTE — Patient Instructions (Signed)
Fluid In The Middle Ear, No Infection (Adult)  Earaches can happen without an infection. This can occur when air and fluid build up behind the eardrum causing pain and reduced hearing. This is called "serous otitis media." It means "fluid in the middle ear." It can happen when you have a cold if congestion blocks the passage that drains the middle ear (eustachian tube). It may also occur with nasal allergies, gastric acid reflux (GERD), or after a bacterial middle ear infection.    The pain may come and go. You may hear clicking or popping sounds when chewing or swallowing. You may feel that your balance is off. Or you may hear ringing in the ear.  It often takes from several weeks up to three months for the fluid to clear on its own. Oral pain relievers and ear drops help with pain. Decongestants and antihistamines sometimes help. This condition does not respond to antibiotics since there is no infection. Your doctor may prescribe a nasal spray to help reduce swelling in the nose and eustachian tube. This can allow the ear to drain.  If there has been no improvement after three months, surgery may be used to drain the fluid and insert a small tube in the eardrum to permit continued drainage.  Because the middle ear fluid can become infected, it is important to watch for signs of an ear infection which may develop later. These signs include ear pain, fever, or drainage from the ear.  Home Care:   You may use acetaminophen (Tylenol) or ibuprofen (Motrin, Advil) to control pain, unless another medicine was prescribed. [NOTE: If you have chronic liver or kidney disease or ever had a stomach ulcer or GI bleeding, talk with your doctor before using these medicines.] (Aspirin should never be used in anyone under 18 years of age who is ill with a fever. It may cause severe liver damage.)   You may use over-the-counter decongestants such as pseudoephedrine (Sudafed).   You may use medications such as guaifenesin  (Mucinex) to thin mucus and promote drainage.  Follow Up  with your doctor or as advised if you are not feeling better after three days.  Get Prompt Medical Attention  if any of the following occur:   Ear pain gets worse or does not start to improve after three days of treatment   Fever of 100.4F (38C) or higher, or as directed by your healthcare provider   Fluid or blood draining from the ear   Headache or sinus pain   Stiff neck   Unusual drowsiness or confusion   2000-2015 [employer]. [address], [place] [address]. All rights reserved. This information is not intended as a substitute for professional medical care. Always follow your healthcare professional's instructions.

## 2015-01-04 ENCOUNTER — Telehealth (INDEPENDENT_AMBULATORY_CARE_PROVIDER_SITE_OTHER): Payer: Self-pay

## 2015-01-04 NOTE — Telephone Encounter (Signed)
Called to check on patients condition, LMTCB

## 2015-04-17 ENCOUNTER — Inpatient Hospital Stay: Payer: Commercial Managed Care - POS | Admitting: Family Medicine

## 2015-04-17 ENCOUNTER — Emergency Department: Payer: Commercial Managed Care - POS

## 2015-04-17 ENCOUNTER — Inpatient Hospital Stay
Admission: EM | Admit: 2015-04-17 | Discharge: 2015-04-22 | DRG: 439 | Disposition: A | Discharge status: Home / Self Care | Payer: Commercial Managed Care - POS | Attending: Family Medicine | Admitting: Family Medicine

## 2015-04-17 DIAGNOSIS — K8521 Alcohol induced acute pancreatitis with uninfected necrosis: Principal | ICD-10-CM | POA: Diagnosis present

## 2015-04-17 DIAGNOSIS — J209 Acute bronchitis, unspecified: Secondary | ICD-10-CM | POA: Diagnosis present

## 2015-04-17 DIAGNOSIS — K859 Acute pancreatitis without necrosis or infection, unspecified: Secondary | ICD-10-CM | POA: Diagnosis present

## 2015-04-17 DIAGNOSIS — Z9049 Acquired absence of other specified parts of digestive tract: Secondary | ICD-10-CM

## 2015-04-17 DIAGNOSIS — J441 Chronic obstructive pulmonary disease with (acute) exacerbation: Secondary | ICD-10-CM | POA: Diagnosis present

## 2015-04-17 DIAGNOSIS — Z885 Allergy status to narcotic agent status: Secondary | ICD-10-CM

## 2015-04-17 DIAGNOSIS — J440 Chronic obstructive pulmonary disease with acute lower respiratory infection: Secondary | ICD-10-CM | POA: Diagnosis present

## 2015-04-17 DIAGNOSIS — F1010 Alcohol abuse, uncomplicated: Secondary | ICD-10-CM | POA: Diagnosis present

## 2015-04-17 DIAGNOSIS — R0902 Hypoxemia: Secondary | ICD-10-CM | POA: Diagnosis present

## 2015-04-17 DIAGNOSIS — R160 Hepatomegaly, not elsewhere classified: Secondary | ICD-10-CM

## 2015-04-17 DIAGNOSIS — Z66 Do not resuscitate: Secondary | ICD-10-CM | POA: Diagnosis present

## 2015-04-17 DIAGNOSIS — I10 Essential (primary) hypertension: Secondary | ICD-10-CM | POA: Diagnosis present

## 2015-04-17 DIAGNOSIS — F17210 Nicotine dependence, cigarettes, uncomplicated: Secondary | ICD-10-CM | POA: Diagnosis present

## 2015-04-17 DIAGNOSIS — E860 Dehydration: Secondary | ICD-10-CM | POA: Diagnosis present

## 2015-04-17 DIAGNOSIS — J45909 Unspecified asthma, uncomplicated: Secondary | ICD-10-CM | POA: Diagnosis present

## 2015-04-17 DIAGNOSIS — K219 Gastro-esophageal reflux disease without esophagitis: Secondary | ICD-10-CM | POA: Diagnosis present

## 2015-04-17 LAB — BASIC METABOLIC PANEL
Anion Gap: 17.1 mMol/L (ref 7.0–18.0)
Anion Gap: 14.4 mMol/L (ref 7.0–18.0)
BUN / Creatinine Ratio: 8.3 Ratio — ABNORMAL LOW (ref 10.0–30.0)
BUN / Creatinine Ratio: 9.8 Ratio — ABNORMAL LOW (ref 10.0–30.0)
BUN: 7 mg/dL (ref 7–22)
BUN: 8 mg/dL (ref 7–22)
CO2: 20.6 mMol/L (ref 20.0–30.0)
CO2: 20.3 mMol/L (ref 20.0–30.0)
Calcium: 9.5 mg/dL (ref 8.5–10.5)
Calcium: 8.7 mg/dL (ref 8.5–10.5)
Chloride: 102 mMol/L (ref 98–110)
Chloride: 105 mMol/L (ref 98–110)
Creatinine: 0.84 mg/dL (ref 0.60–1.20)
Creatinine: 0.82 mg/dL (ref 0.60–1.20)
EGFR: >60 mL/min/{1.73_m2}
EGFR: >60 mL/min/{1.73_m2}
Glucose: 129 mg/dL — ABNORMAL HIGH (ref 70–99)
Glucose: 111 mg/dL — ABNORMAL HIGH (ref 70–99)
Osmolality Calc: 272 mOsm/kg — ABNORMAL LOW (ref 275–300)
Osmolality Calc: 271 mOsm/kg — ABNORMAL LOW (ref 275–300)
Potassium: 3.7 mMol/L (ref 3.5–5.3)
Potassium: 3.7 mMol/L (ref 3.5–5.3)
Sodium: 136 mMol/L (ref 136–147)
Sodium: 136 mMol/L (ref 136–147)

## 2015-04-17 LAB — CBC AND DIFFERENTIAL
Basophils %: 0.9 % (ref 0.0–3.0)
Basophils Absolute: 0.1 10*3/uL (ref 0.0–0.3)
Eosinophils %: 1 % (ref 0.0–7.0)
Eosinophils Absolute: 0.1 10*3/uL (ref 0.0–0.8)
Hematocrit: 45.1 % (ref 36.0–48.0)
Hemoglobin: 16.1 gm/dL — ABNORMAL HIGH (ref 12.0–16.0)
Lymphocytes Absolute: 1.6 10*3/uL (ref 0.6–5.1)
Lymphocytes: 16.6 % (ref 15.0–46.0)
MCH: 38 pg — ABNORMAL HIGH (ref 28–35)
MCHC: 36 gm/dL (ref 32–36)
MCV: 108 fL — ABNORMAL HIGH (ref 80–100)
MPV: 6.9 fL (ref 6.0–10.0)
Monocytes Absolute: 0.6 10*3/uL (ref 0.1–1.7)
Monocytes: 6.8 % (ref 3.0–15.0)
Neutrophils %: 74.7 % (ref 42.0–78.0)
Neutrophils Absolute: 7.1 10*3/uL (ref 1.7–8.6)
PLT CT: 278 10*3/uL (ref 130–440)
RBC: 4.18 10*6/uL (ref 3.80–5.00)
RDW: 13.3 % (ref 11.0–14.0)
WBC: 9.5 10*3/uL (ref 4.0–11.0)

## 2015-04-17 LAB — ETHANOL: Alcohol: <10 mg/dL (ref 0–9)

## 2015-04-17 LAB — VH URINE DRUG SCREEN
Amphetamine: NEGATIVE
Barbiturates: NEGATIVE
Buprenorphine, Urine: NEGATIVE
Cannabinoids: NEGATIVE
Cocaine: NEGATIVE
Opiates: NEGATIVE
Phencyclidine: NEGATIVE
Urine Benzodiazepines: NEGATIVE
Urine Creatinine Random: 68.58 mg/dL
Urine Methadone Screen: NEGATIVE
Urine Oxycodone: NEGATIVE
Urine Specific Gravity: 1.008 (ref 1.001–1.040)
pH, Urine: 6.1 pH (ref 5.0–8.0)

## 2015-04-17 LAB — HEPATIC FUNCTION PANEL
ALT: 92 U/L — ABNORMAL HIGH (ref 0–55)
AST (SGOT): 96 U/L — ABNORMAL HIGH (ref 10–42)
Albumin/Globulin Ratio: 1.05 Ratio (ref 0.70–1.50)
Albumin: 3.9 gm/dL (ref 3.5–5.0)
Alkaline Phosphatase: 171 U/L — ABNORMAL HIGH (ref 40–145)
Bilirubin Direct: 0.4 mg/dL — ABNORMAL HIGH (ref 0.0–0.3)
Bilirubin, Total: 0.8 mg/dL (ref 0.1–1.2)
Globulin: 3.7 gm/dL (ref 2.0–4.0)
Protein, Total: 7.6 gm/dL (ref 6.0–8.3)

## 2015-04-17 LAB — TROPONIN I
Troponin I: 0.01 ng/mL (ref 0.00–0.02)
Troponin I: <0.01 ng/mL (ref 0.00–0.02)

## 2015-04-17 LAB — HEMOGLOBIN AND HEMATOCRIT, BLOOD
Hematocrit: 41.3 % (ref 36.0–48.0)
Hemoglobin: 14.9 gm/dL (ref 12.0–16.0)

## 2015-04-17 LAB — AMYLASE: Amylase: 287 U/L — ABNORMAL HIGH (ref 30–135)

## 2015-04-17 LAB — TSH: TSH: 0.71 u[IU]/mL (ref 0.40–4.20)

## 2015-04-17 LAB — LIPASE: Lipase: 837 U/L — ABNORMAL HIGH (ref 8–78)

## 2015-04-17 LAB — CALCIUM, IONIZED: Calcium, Ionized: 4.92 mg/dL (ref 4.35–5.10)

## 2015-04-17 MED ORDER — ONDANSETRON HCL 4 MG/2ML IJ SOLN
4.0000 mg | Freq: Once | INTRAMUSCULAR | Status: AC
Start: 2015-04-17 — End: 2015-04-17
  Administered 2015-04-17: 4 mg via INTRAVENOUS

## 2015-04-17 MED ORDER — VH HYDROMORPHONE HCL PF 1 MG/ML CARPUJECT
0.5000 mg | INTRAMUSCULAR | Status: DC | PRN
Start: 2015-04-17 — End: 2015-04-19
  Administered 2015-04-17 – 2015-04-19 (×9): 0.5 mg via INTRAVENOUS
  Filled 2015-04-17 (×8): qty 1

## 2015-04-17 MED ORDER — ONDANSETRON HCL 4 MG/2ML IJ SOLN
4.0000 mg | Freq: Three times a day (TID) | INTRAMUSCULAR | Status: DC | PRN
Start: 2015-04-17 — End: 2015-04-22
  Administered 2015-04-17 – 2015-04-18 (×2): 4 mg via INTRAVENOUS
  Filled 2015-04-17: qty 2

## 2015-04-17 MED ORDER — ONDANSETRON 4 MG PO TBDP
4.0000 mg | ORAL_TABLET | Freq: Three times a day (TID) | ORAL | Status: DC | PRN
Start: 2015-04-17 — End: 2015-04-22

## 2015-04-17 MED ORDER — LORAZEPAM 2 MG/ML IJ SOLN
2.0000 mg | INTRAMUSCULAR | Status: DC | PRN
Start: 2015-04-17 — End: 2015-04-22

## 2015-04-17 MED ORDER — ONDANSETRON HCL 4 MG/2ML IJ SOLN
INTRAMUSCULAR | Status: AC
Start: 2015-04-17 — End: ?
  Filled 2015-04-17: qty 2

## 2015-04-17 MED ORDER — FOLIC ACID 1 MG PO TABS
1.0000 mg | ORAL_TABLET | Freq: Every day | ORAL | Status: DC
Start: 2015-04-19 — End: 2015-04-22
  Administered 2015-04-19 – 2015-04-22 (×4): 1 mg via ORAL
  Filled 2015-04-17 (×4): qty 1

## 2015-04-17 MED ORDER — VH HYDROMORPHONE HCL 1 MG/ML (NARRATOR)
INTRAMUSCULAR | Status: AC
Start: 2015-04-17 — End: ?
  Filled 2015-04-17: qty 1

## 2015-04-17 MED ORDER — PROMETHAZINE HCL 12.5 MG RE SUPP
12.5000 mg | Freq: Four times a day (QID) | RECTAL | Status: DC | PRN
Start: 2015-04-17 — End: 2015-04-22
  Filled 2015-04-17: qty 1

## 2015-04-17 MED ORDER — VH HYDROMORPHONE HCL PF 1 MG/ML CARPUJECT
1.0000 mg | Freq: Once | INTRAMUSCULAR | Status: AC
Start: 2015-04-17 — End: 2015-04-17
  Administered 2015-04-17: 1 mg via INTRAVENOUS

## 2015-04-17 MED ORDER — VH PROMETHAZINE HCL 25 MG/ML IM SOLN
6.2500 mg | Freq: Four times a day (QID) | INTRAMUSCULAR | Status: DC | PRN
Start: 2015-04-17 — End: 2015-04-22

## 2015-04-17 MED ORDER — SODIUM CHLORIDE 0.9 % IV BOLUS
1000.0000 mL | Freq: Once | INTRAVENOUS | Status: AC
Start: 2015-04-17 — End: 2015-04-17
  Administered 2015-04-17: 1000 mL via INTRAVENOUS

## 2015-04-17 MED ORDER — LACTATED RINGERS IV SOLN
INTRAVENOUS | Status: DC
Start: 2015-04-17 — End: 2015-04-19

## 2015-04-17 MED ORDER — VITAMIN B-1 100 MG PO TABS
100.0000 mg | ORAL_TABLET | Freq: Every day | ORAL | Status: DC
Start: 2015-04-17 — End: 2015-04-22
  Administered 2015-04-17 – 2015-04-22 (×6): 100 mg via ORAL
  Filled 2015-04-17 (×6): qty 1

## 2015-04-17 MED ORDER — POTASSIUM CHLORIDE CRYS ER 20 MEQ PO TBCR
20.0000 meq | EXTENDED_RELEASE_TABLET | Freq: Two times a day (BID) | ORAL | Status: DC
Start: 2015-04-17 — End: 2015-04-22
  Administered 2015-04-17 – 2015-04-22 (×10): 20 meq via ORAL
  Filled 2015-04-17 (×13): qty 1

## 2015-04-17 MED ORDER — PROMETHAZINE HCL 25 MG PO TABS
25.0000 mg | ORAL_TABLET | Freq: Four times a day (QID) | ORAL | Status: DC | PRN
Start: 2015-04-17 — End: 2015-04-22
  Administered 2015-04-17 – 2015-04-18 (×2): 25 mg via ORAL
  Filled 2015-04-17 (×2): qty 1

## 2015-04-17 MED ORDER — CEROVITE ADVANCED FORMULA PO TABS
1.0000 | ORAL_TABLET | Freq: Every morning | ORAL | Status: DC
Start: 2015-04-18 — End: 2015-04-22
  Administered 2015-04-18 – 2015-04-22 (×5): 1 via ORAL
  Filled 2015-04-17 (×5): qty 1

## 2015-04-17 MED ORDER — PANTOPRAZOLE SODIUM 40 MG PO TBEC
40.0000 mg | DELAYED_RELEASE_TABLET | Freq: Every day | ORAL | Status: DC
Start: 2015-04-18 — End: 2015-04-22
  Administered 2015-04-18 – 2015-04-22 (×5): 40 mg via ORAL
  Filled 2015-04-17 (×6): qty 1

## 2015-04-17 MED ORDER — ENOXAPARIN SODIUM 40 MG/0.4ML SC SOLN
40.0000 mg | SUBCUTANEOUS | Status: DC
Start: 2015-04-17 — End: 2015-04-22
  Administered 2015-04-18: 40 mg via SUBCUTANEOUS
  Filled 2015-04-17 (×7): qty 0.4

## 2015-04-17 MED ORDER — NICOTINE 14 MG/24HR TD PT24
1.0000 | MEDICATED_PATCH | Freq: Every day | TRANSDERMAL | Status: DC
Start: 2015-04-17 — End: 2015-04-22
  Administered 2015-04-18 – 2015-04-22 (×5): 1 via TRANSDERMAL
  Filled 2015-04-17 (×7): qty 1

## 2015-04-17 MED ORDER — LABETALOL HCL 200 MG PO TABS
200.0000 mg | ORAL_TABLET | Freq: Two times a day (BID) | ORAL | Status: DC
Start: 2015-04-17 — End: 2015-04-22
  Administered 2015-04-17 – 2015-04-22 (×10): 200 mg via ORAL
  Filled 2015-04-17 (×11): qty 1

## 2015-04-17 NOTE — ED Notes (Signed)
Patient rang call bell and stated "um yea can I get more zofran and pain killers?" Patient sitting up in bed watching television.

## 2015-04-17 NOTE — ED Notes (Signed)
Patient via triage with CC left-sided ABD pain. Patient reports onset last night, accompanied with N/V. Patient also reports diarrhea today. Patient denies fevers, urinary symptoms. Patient reports history of pancreatitis and states "feels same."

## 2015-04-17 NOTE — ED Notes (Signed)
Patient to be admitted, moderately stable condition. VSS. Patient agreeable to current POC including admission and further treatment. All questions and concerns addressed @ this time. All belongings to be sent with patient.

## 2015-04-17 NOTE — Plan of Care (Signed)
Advance Care Planning Services - [facility]     Patient Name: [name] LOS: 0 days   Attending Physician: Theora Master, MD   Primary Care Physician: Cain Saupe, MD   Narrative of Meeting [facility]   Events prompting this discussion:  Admission to the hospital for pancreatitis    Disease and the trajectory:  Acute pancreatitis  Alcohol abuse  Hypertension  Hyperlipidemia  Risky behaviors    The types of care discussed:  Standard of care for disease, Modification of standard care, Resuscitation with CPR and Intubation/TracheostomyTube    Patient/Surrogate wishes and goals for treatment:  I discussed with the patient whether she wants resuscitation with CPR and intubation. Patient states she does not want either of these. Absolutely not. I asked patient about CPAP she states this would be okay but she definitely would not want pressors. I discussed what these are she states she's had them before and she does not want them.    What care aligns with patient values:  DNR with CPAP only   Active Problem List  Active Problems:    Acute pancreatitis       Documents Filled Out As Part of Discussion               [facility]   Physician Orders for Scope of Treatment (POST)   Acknowledgement of patient's right to decline advance care planning:  The does not lack medical decision making capability and was represented by self.  The patient/surrogate was informed that this session is completely voluntary and was given the opportunity to decline the ACP services. The patient/surrogate made the decision to participate in the ACP session with the discussion proceeding as described above.     Reference:  Detering et al, BMJ 2010; 340 doi: Pop[employer]it (Published 01 July 2008)  Advance care planning:    Improves end of life care   Improves patient & family satisfaction   Reduces stress, anxiety, & depression in surviving relatives   [phone] - first 30 minutes  face-to-face    [phone] - each additional 30 minutes face-to-face    I have spent 16 minutes on face-to-face Advance Care Planning Services   100% of the time was spent on discussion and counseling the patient and roommate.   No active management of the problems listed above was undertaken during the time period reported.            MRN: [id number]           CSN: [id number]  DOB: 1960-06-11   Donnie Coffin, DO  04/17/2015 5:08 PM   [facility]  [address]  [place], [id number]  [phone]

## 2015-04-17 NOTE — H&P (Signed)
HISTORY AND PHYSICAL - [facility]    Date Time: 04/17/2015 3:45 PM  Patient Name: [name]  Attending Physician: Theora Master, MD  Primary Care Physician: Cain Saupe, MD    CC:   Chief Complaint   Patient presents with   . Abdominal Pain       Assessment/Plan                                                                              [facility]     Acute Pancreatitis likely due to alcohol use  IVF @ 250/hr  CT shows acute pancreatitis  Pain control  Lipase 837  Will hold NG tube for now but may need to place if persistent vomiting  Nothing by mouth  Recheck lipase in the morning    Nausea vomiting and some diarrhea  Antiemetics as needed  May need to place NG tube    Elevated LFTs  Will monitor closely  Liver is diffusely decreased in attenuation compatible with steatosis and contains an approximately 15 mm  hypodensity in the inferior aspect of the right lobe. This is incompletely evaluated on the current study and further evaluation with elective hepatic mass protocol CT or MRI is recommended after the resolution of acute illness.  Will recheck in the morning    Hypertension  Continue home medications with holding parameters    History of chronically low potassium  Replete potassium as needed   check an a.m.    Tobacco use  Smoking cessation counseling given  Started on nicotine patches    ETOH abuse  Monitor on CIWA protocol  She states she only drinks about once a week but a significant amount if she does. She'll the holidays  Asthma with no exacerbation  Continue bronchodilators as needed      Nutrition: npo  GI Prophylaxis: not indicated  DVT Prophylaxis: low molecular weight heparin and SCD's while in bed  Code Status: do not resuscitate  Please see my plan of care note    Service status: INPATIENT   risk of morbidity and mortality and risk of progressive disease    Anticipate discharge in:3 days.  History of Presenting Illness:                                                           [facility] [name] is a 55 y.o. female who presents to the hospital with abdominal pain that started last night.  She did recently drink last night but states she only drinks intermittently.  She has had some n/v/d this morning too but no fevers.    She has had about 10 episodes of vomiting in the emergency room but states she does not want an NG tube at least right now. She denies any fevers, chills. She denies any chest pain or shortness of breath.  She states the abdominal pain started yesterday it goes in her mid section and radiate to her back. It does not  radiate to her lower abdomen. She states she also has a hernia but she is concerned about.      Review Of Systems:                                                                          [facility]   1. Constitutional:  Denies fever, weight loss or fatigue.  2. Eyes:  Denies blurry vision or double vision.  3. ENT:  Denies sore throat or trouble swallowing.  4. CV:  Denies chest pain or palpitations.  5. Respiratory:  Denies SOB or cough.  6. GI:  See above  7. GU: Denies pain or burning with urination.  8. MS: Denies new muscle or joint pain.  9. Skin: Denies rash.  10. Neuro: Denies any new areas of numbness or weakness.  11. Psych: Denies being depressed or anxious.  12. Heme: Denies any bleeding or recent anemia.  13. Endocrine: Denies heat. Denies excessive thirst or urination.  Patient states she is always cold  14. Immune:  Denies seasonal allergies or asthma exacerbation.    Past Medical History:                                                                        [facility]     Past Medical History   Diagnosis Date   . Hypertension    . Asthma without status asthmaticus    . Gastroesophageal reflux disease    . Pancreatitis    . DVT (deep venous thrombosis)    . S/P IVC filter    . Clostridium difficile colitis    . H/O ETOH abuse      sober 2 years until 03/2014       Past Surgical  History:                                                                       [facility]     Past Surgical History   Procedure Laterality Date   . Colonoscopy  12/26/2012     Procedure: COLONOSCOPY;  Surgeon: Gwenith Spitz, MD;  Location: [facility];  Service: Gastroenterology;  Laterality: N/A;   . Colonoscopy, polypectomy  12/26/2012     Procedure: COLONOSCOPY, POLYPECTOMY;  Surgeon: Gwenith Spitz, MD;  Location: [facility];  Service: Gastroenterology;  Laterality: N/A;   . Cholecystectomy     . Pancreatectomy  01/2011     partial       Family History:  [facility]     Family History   Problem Relation Age of Onset   . Adopted: Yes       Social History:                                                                                    [employer]     Social History     Social History   . Marital Status: Divorced     Spouse Name: N/A   . Number of Children: N/A   . Years of Education: N/A     Occupational History   . Not on file.     Social History Main Topics   . Smoking status: Current Every Day Smoker -- 0.25 packs/day for 30 years     Types: Cigarettes   . Smokeless tobacco: Never Used   . Alcohol Use: Yes      Comment: occasional   . Drug Use: No   . Sexual Activity: Not on file     Other Topics Concern   . Not on file     Social History Narrative       Code Status: Prior  Allergies:                                                                                             [facility]     Allergies   Allergen Reactions   . Percocet [Oxycodone-Acetaminophen] Other (See Comments)     Makes her "loopy."       Medications:                                                                                       [facility]     No current facility-administered medications on file prior to encounter.     Current Outpatient Prescriptions on File Prior to Encounter   Medication Sig  Dispense Refill   . amLODIPine (NORVASC) 10 MG tablet Take 10 mg by mouth every morning.        . labetalol (NORMODYNE) 100 MG tablet Take 200 mg by mouth 2 (two) times daily. Take two 100 mg tablets for a total dose of 200 mg every 12 hours       . Multiple Vitamins-Minerals (MULTIVITAMIN WITH MINERALS) tablet Take 1 tablet by mouth every morning.        . pantoprazole (PROTONIX) 40 MG tablet Take 40 mg by mouth every morning.        Marland Kitchen  potassium chloride (K-DUR,KLOR-CON) 20 MEQ tablet Take 1 tablet (20 mEq total) by mouth 2 (two) times daily. 10 tablet 0   . vitamin B-1 (THIAMINE) 100 MG tablet Take 1 tablet (100 mg total) by mouth daily. 30 tablet 0   . [DISCONTINUED] cetirizine (ZYRTEC ALLERGY) 10 MG tablet Take 1 tablet (10 mg total) by mouth daily. 30 tablet 0   . [DISCONTINUED] diphenhydrAMINE (BENADRYL) 25 MG tablet Take 1 tablet (25 mg total) by mouth nightly as needed for Sleep.  0   . [DISCONTINUED] fluticasone (FLONASE) 50 MCG/ACT nasal spray 2 sprays by Nasal route daily. 16 g 0   . [DISCONTINUED] meclizine (ANTIVERT) 25 MG tablet Take 1 tablet (25 mg total) by mouth 3 (three) times daily as needed. 12 tablet 0         Physical Exam:                                                                                   [facility]     Patient Vitals for the past 24 hrs:   BP Temp Pulse Resp SpO2 Height Weight   04/17/15 1510 141/90 mmHg - 84 18 97 % - -   04/17/15 1349 (!) 163/118 mmHg 97.6 F (36.4 C) 83 18 95 % 1.651 m (5\' 5" ) 84.8 kg (186 lb 15.2 oz)     Body mass index is 31.11 kg/(m^2).  No intake or output data in the 24 hours ending 04/17/15 1545    General: awake, alert, oriented x 3; no acute distress.  HEENT: perrla, eomi, sclera anicteric  oropharynx clear without lesions, mucous membranes moist  Neck: supple, no lymphadenopathy, no thyromegaly, no JVD, no carotid bruits  Cardiovascular: regular rate and rhythm, no murmurs, rubs or gallops  Lungs: clear to auscultation bilaterally, without  wheezing, rhonchi, or rales  Abdomen: soft, +epigastric tenderness; non-distended; no palpable masses, no hepatosplenomegaly, normoactive bowel sounds, no rebound or guarding  Extremities: no clubbing, cyanosis, or edema  Neuro: cranial nerves grossly intact, strength 5/5 in upper and lower extremities, sensation intact,   Skin: no rashes or lesions noted        Labs and Imaging:                                                                              [facility]     Results     Procedure Component Value Units Date/Time    CBC and differential [[id number]]  (Abnormal) Collected:  04/17/15 1359    Specimen Information:  Blood from Blood Updated:  04/17/15 1519     WBC 9.5 K/cmm      RBC 4.18 M/cmm      Hemoglobin 16.1 (H) gm/dL      Hematocrit 41.3 %      MCV 108 (H) fL      MCH 38 (H) pg  MCHC 36 gm/dL      RDW 16.1 %      PLT CT 278 K/cmm      MPV 6.9 fL      NEUTROPHIL % 74.7 %      Lymphocytes 16.6 %      Monocytes 6.8 %      Eosinophils % 1.0 %      Basophils % 0.9 %      Neutrophils Absolute 7.1 K/cmm      Lymphocytes Absolute 1.6 K/cmm      Monocytes Absolute 0.6 K/cmm      Eosinophils Absolute 0.1 K/cmm      BASO Absolute 0.1 K/cmm      RBC Morphology RBC Morphology Reviewed      Macrocytic 2+      Anisocytosis 1+      Polychromasia 1+      Target Cells 2+     Alcohol Level [[id number]] Collected:  04/17/15 1359    Specimen Information:  Plasma Updated:  04/17/15 1441     Alcohol <10 mg/dL     Basic Metabolic Panel [[id number]]  (Abnormal) Collected:  04/17/15 1359    Specimen Information:  Plasma Updated:  04/17/15 1439     Sodium 136 mMol/L      Potassium 3.7 mMol/L      Chloride 102 mMol/L      CO2 20.6 mMol/L      Calcium 9.5 mg/dL      Glucose 956 (H) mg/dL      Creatinine 2.13 mg/dL      BUN 7 mg/dL      Anion Gap 08.6 mMol/L      BUN/Creatinine Ratio 8.3 (L) Ratio      EGFR >60 mL/min/1.107m2      Osmolality Calc 272 (L) mOsm/kg     Lipase [[id number]]  (Abnormal) Collected:  04/17/15 1359     Specimen Information:  Plasma Updated:  04/17/15 1439     Lipase 837 (H) U/L     Hepatic function panel (LFT) [[id number]]  (Abnormal) Collected:  04/17/15 1359    Specimen Information:  Plasma Updated:  04/17/15 1439     Protein, Total 7.6 gm/dL      Albumin 3.9 gm/dL      Alkaline Phosphatase 171 (H) U/L      ALT 92 (H) U/L      AST (SGOT) 96 (H) U/L      Bilirubin, Total 0.8 mg/dL      Bilirubin, Direct 0.4 (H) mg/dL      Albumin/Globulin Ratio 1.05 Ratio      Globulin 3.7 gm/dL           CT Abdomen Pelvis with IV Cont    (Results Pending)       EKG NSR    Signed by: Donnie Coffin, DO    I have personally reviewed past records in Epic and recent labs  New orders were placed      [facility]  [address]  [place], [place]    Pager [phone]  Phone [phone]  UY:[id number], Victor, MD

## 2015-04-17 NOTE — Plan of Care (Signed)
Problem: Safe medical management of withdrawal from (list substances of abuse) AS EVIDENCED BY...  Goal: Patient's recovery goal in his/her own words:  Outcome: Progressing

## 2015-04-17 NOTE — ED Provider Notes (Addendum)
Physician/Midlevel provider first contact with patient: 04/17/15 1417         [facility] EMERGENCY DEPARTMENT History and Physical Exam      Patient Name: [name], [name]  Encounter Date:  04/17/2015  Attending Physician: Theora Master, MD  PCP: Cain Saupe, MD  Patient DOB:  Jul 26, 1960  MRN:  [id number]  Room:  W43/W43-A      History of Presenting Illness       HPI/ROS limited by:   []  Dementia  []  Pt Unconscious  []  Post-ictal  []  Intoxicated  []  Confused   []  Acuity   Other:    HPI/ROS provided by:   []  Patient   []  Family   []  EMS   []  Friend   []  Caregiver   []  POA   Other:    HPI     55 y.o. female presents with upper abdominal and left-sided abdominal pain that started last night.. The patient has a history of alcoholic pancreatitis and does admit that she drank last night. She says she has been sober but only drinks intermittently. She is here with her roommate. On arrival she is in pain and said she had an episode of nausea and vomiting and diarrhea all this morning. She denies any fevers or chills.         Review of Systems   Review of Systems   Constitutional: Negative.    HENT: Negative.    Eyes: Negative.    Respiratory: Negative.    Cardiovascular: Negative.    Gastrointestinal: Positive for nausea, vomiting, abdominal pain and diarrhea.   Genitourinary: Negative.    Musculoskeletal: Negative.    Skin: Negative.    Neurological: Negative.    Psychiatric/Behavioral: Negative.             [x]  Except as marked above as positive, the remainder of the systems above were reviewed and found negative.      Allergies     Pt is allergic to percocet.    Medications     Current Outpatient Rx   Name  Route  Sig  Dispense  Refill   . amLODIPine (NORVASC) 10 MG tablet    Oral    Take 10 mg by mouth every morning.                Marland Kitchen ibuprofen (ADVIL,MOTRIN) 200 MG tablet    Oral    Take 600 mg by mouth every 6 (six) hours as needed for Pain.             Marland Kitchen labetalol (NORMODYNE) 100 MG tablet    Oral    Take 200 mg by  mouth 2 (two) times daily. Take two 100 mg tablets for a total dose of 200 mg every 12 hours               . Multiple Vitamins-Minerals (MULTIVITAMIN WITH MINERALS) tablet    Oral    Take 1 tablet by mouth every morning.                . pantoprazole (PROTONIX) 40 MG tablet    Oral    Take 40 mg by mouth every morning.                . potassium chloride (K-DUR,KLOR-CON) 20 MEQ tablet    Oral    Take 1 tablet (20 mEq total) by mouth 2 (two) times daily.    10 tablet    0     .  vitamin B-1 (THIAMINE) 100 MG tablet    Oral    Take 1 tablet (100 mg total) by mouth daily.    30 tablet    0     . DISCONTD: cetirizine (ZYRTEC ALLERGY) 10 MG tablet    Oral    Take 1 tablet (10 mg total) by mouth daily.    30 tablet    0     . DISCONTD: diphenhydrAMINE (BENADRYL) 25 MG tablet    Oral    Take 1 tablet (25 mg total) by mouth nightly as needed for Sleep.        0     . DISCONTD: fluticasone (FLONASE) 50 MCG/ACT nasal spray    Nasal    2 sprays by Nasal route daily.    16 g    0     . DISCONTD: meclizine (ANTIVERT) 25 MG tablet    Oral    Take 1 tablet (25 mg total) by mouth 3 (three) times daily as needed.    12 tablet    0          Past Medical History     Pt has a past medical history of Hypertension; Asthma without status asthmaticus; Gastroesophageal reflux disease; Pancreatitis; DVT (deep venous thrombosis); S/P IVC filter; Clostridium difficile colitis; and H/O ETOH abuse.    Past Surgical History     Pt has past surgical history that includes Colonoscopy (12/26/2012); COLONOSCOPY, POLYPECTOMY (12/26/2012); Cholecystectomy; and Pancreatectomy (01/2011).    Family History     The family history is not on file. She was adopted.    Social History     Pt reports that she has been smoking Cigarettes.  She has a 7.5 pack-year smoking history. She has never used smokeless tobacco. She reports that she drinks alcohol. She reports that she does not use illicit drugs.    Physical Exam     Blood pressure 131/85, pulse 84, temperature  97.7 F (36.5 C), temperature source Oral, resp. rate 16, height 1.651 m, weight 84.8 kg, SpO2 96 %.    Physical Exam   Constitutional: She is oriented to person, place, and time. Vital signs are normal. She appears well-developed and well-nourished. She is active and cooperative.   HENT:   Head: Normocephalic and atraumatic.   Right Ear: Hearing, tympanic membrane and ear canal normal.   Left Ear: Hearing, tympanic membrane and ear canal normal.   Nose: Nose normal.   Mouth/Throat: Uvula is midline, oropharynx is clear and moist and mucous membranes are normal.   Eyes: Conjunctivae, EOM and lids are normal. Pupils are equal, round, and reactive to light. No scleral icterus.   Neck: Trachea normal, normal range of motion, full passive range of motion without pain and phonation normal. Neck supple.   Cardiovascular: Normal rate, regular rhythm, S1 normal, S2 normal, normal heart sounds and intact distal pulses.    Pulmonary/Chest: Effort normal and breath sounds normal.   Abdominal: Soft. Normal appearance, normal aorta and bowel sounds are normal. She exhibits no distension. There is tenderness.   ssoft with bowel sounds and epigastric tenderness. I don't feel mass.   Musculoskeletal: Normal range of motion.   Neurological: She is alert and oriented to person, place, and time. She has normal strength.   Skin: Skin is warm, dry and intact.   Psychiatric: She has a normal mood and affect. Her behavior is normal.   Nursing note and vitals reviewed.       Orders Placed  Orders Placed This Encounter   Procedures   . CT Abdomen Pelvis with IV Cont   . Basic Metabolic Panel   . CBC and differential   . Lipase   . Hepatic function panel (LFT)   . Urine Drug Screen   . Alcohol Level   . ECG 12 lead (Stat)   . Saline lock IV   . ED Admission Request       Diagnostic Results       Labs  Results     Procedure Component Value Units Date/Time    CBC and differential [[id number]]  (Abnormal) Collected:  04/17/15 1359     Specimen Information:  Blood from Blood Updated:  04/17/15 1519     WBC 9.5 K/cmm      RBC 4.18 M/cmm      Hemoglobin 16.1 (H) gm/dL      Hematocrit 40.9 %      MCV 108 (H) fL      MCH 38 (H) pg      MCHC 36 gm/dL      RDW 81.1 %      PLT CT 278 K/cmm      MPV 6.9 fL      NEUTROPHIL % 74.7 %      Lymphocytes 16.6 %      Monocytes 6.8 %      Eosinophils % 1.0 %      Basophils % 0.9 %      Neutrophils Absolute 7.1 K/cmm      Lymphocytes Absolute 1.6 K/cmm      Monocytes Absolute 0.6 K/cmm      Eosinophils Absolute 0.1 K/cmm      BASO Absolute 0.1 K/cmm      RBC Morphology RBC Morphology Reviewed      Macrocytic 2+      Anisocytosis 1+      Polychromasia 1+      Target Cells 2+     Alcohol Level [[id number]] Collected:  04/17/15 1359    Specimen Information:  Plasma Updated:  04/17/15 1441     Alcohol <10 mg/dL     Basic Metabolic Panel [[id number]]  (Abnormal) Collected:  04/17/15 1359    Specimen Information:  Plasma Updated:  04/17/15 1439     Sodium 136 mMol/L      Potassium 3.7 mMol/L      Chloride 102 mMol/L      CO2 20.6 mMol/L      Calcium 9.5 mg/dL      Glucose 469 (H) mg/dL      Creatinine 6.29 mg/dL      BUN 7 mg/dL      Anion Gap 52.8 mMol/L      BUN/Creatinine Ratio 8.3 (L) Ratio      EGFR >60 mL/min/1.49m2      Osmolality Calc 272 (L) mOsm/kg     Lipase [[id number]]  (Abnormal) Collected:  04/17/15 1359    Specimen Information:  Plasma Updated:  04/17/15 1439     Lipase 837 (H) U/L     Hepatic function panel (LFT) [[id number]]  (Abnormal) Collected:  04/17/15 1359    Specimen Information:  Plasma Updated:  04/17/15 1439     Protein, Total 7.6 gm/dL      Albumin 3.9 gm/dL      Alkaline Phosphatase 171 (H) U/L      ALT 92 (H) U/L      AST (SGOT) 96 (H) U/L      Bilirubin, Total 0.8 mg/dL  Bilirubin, Direct 0.4 (H) mg/dL      Albumin/Globulin Ratio 1.05 Ratio      Globulin 3.7 gm/dL           Radiologic Studies  Ct Abdomen Pelvis With Iv Cont    04/17/2015  1.  There are peripancreatic inflammatory changes  compatible with acute pancreatitis. No portal vein thrombosis or pancreatic necrosis is identified. Dilatation of the pancreatic duct and common bile duct is unchanged from prior study. 2.  Liver is diffusely decreased in attenuation compatible with steatosis and contains an approximately 15 mm hypodensity in the inferior aspect of the right lobe. This is incompletely evaluated on the current study and further evaluation with elective hepatic mass protocol CT or MRI is recommended after the resolution of acute illness. 3.  There is a ventral hernia just left of the umbilicus which measures approximately 7 cm in greatest dimension and contains a short segment of nonobstructed small bowel. There are also small fat-containing umbilical and bilateral inguinal hernias without evidence of incarceration. 4.  Additional incidental findings as detailed above. ReadingStation:[id number]      EKG  []  Viewed by me []  Interpreted by me []  Abnormal: see below        [x]  Reviewed labs and radiology if applicable  [x]  Reviewed Old Records if available        MDM / Critical Care   Blood pressure 131/85, pulse 84, temperature 97.7 F (36.5 C), temperature source Oral, resp. rate 16, height 1.651 m, weight 84.8 kg, SpO2 96 %.    Patient here with above. She is uncomfortable and received IV fluids and Zofran and then subsequently Dilaudid 1 mg IV. Labs came back showing pancreatitis with a lipase greater than 800. Patient says that that is actually low for her. I will put her through for a CAT scan. Her last CAT scan showed no evidence of pseudocyst although it does appear that she had one in 2013. I have discussed the case with the hospitalist for admission.      Discussed with:    [x]  Admitting Attending  [x]  Family   []  Radiologist   []  PCP   []  Consultant     Procedures              Diagnosis / Disposition     Clinical Impression  1. Alcohol-induced acute pancreatitis with uninfected necrosis    2. Liver mass        Disposition  ED  Disposition     Admit           Prescriptions  New Prescriptions    No medications on file                                          Theora Master, MD  04/17/15 1535    Theora Master, MD  04/17/15 [phone]

## 2015-04-18 LAB — CBC AND DIFFERENTIAL
Basophils %: 0.5 % (ref 0.0–3.0)
Basophils Absolute: 0 10*3/uL (ref 0.0–0.3)
Eosinophils %: 2.7 % (ref 0.0–7.0)
Eosinophils Absolute: 0.2 10*3/uL (ref 0.0–0.8)
Hematocrit: 40.3 % (ref 36.0–48.0)
Hemoglobin: 14 gm/dL (ref 12.0–16.0)
Lymphocytes Absolute: 1.3 10*3/uL (ref 0.6–5.1)
Lymphocytes: 15.6 % (ref 15.0–46.0)
MCH: 38 pg — ABNORMAL HIGH (ref 28–35)
MCHC: 35 gm/dL (ref 32–36)
MCV: 109 fL — ABNORMAL HIGH (ref 80–100)
MPV: 6.9 fL (ref 6.0–10.0)
Monocytes Absolute: 0.8 10*3/uL (ref 0.1–1.7)
Monocytes: 9.3 % (ref 3.0–15.0)
Neutrophils %: 72 % (ref 42.0–78.0)
Neutrophils Absolute: 5.9 10*3/uL (ref 1.7–8.6)
PLT CT: 236 10*3/uL (ref 130–440)
RBC: 3.69 10*6/uL — ABNORMAL LOW (ref 3.80–5.00)
RDW: 13.2 % (ref 11.0–14.0)
WBC: 8.2 10*3/uL (ref 4.0–11.0)

## 2015-04-18 LAB — BASIC METABOLIC PANEL
Anion Gap: 14.3 mMol/L (ref 7.0–18.0)
Anion Gap: 13.5 mMol/L (ref 7.0–18.0)
BUN / Creatinine Ratio: 8.6 Ratio — ABNORMAL LOW (ref 10.0–30.0)
BUN / Creatinine Ratio: 10.3 Ratio (ref 10.0–30.0)
BUN: 7 mg/dL (ref 7–22)
BUN: 8 mg/dL (ref 7–22)
CO2: 21.6 mMol/L (ref 20.0–30.0)
CO2: 21.4 mMol/L (ref 20.0–30.0)
Calcium: 8.8 mg/dL (ref 8.5–10.5)
Calcium: 8.7 mg/dL (ref 8.5–10.5)
Chloride: 107 mMol/L (ref 98–110)
Chloride: 106 mMol/L (ref 98–110)
Creatinine: 0.81 mg/dL (ref 0.60–1.20)
Creatinine: 0.78 mg/dL (ref 0.60–1.20)
EGFR: >60 mL/min/{1.73_m2}
EGFR: >60 mL/min/{1.73_m2}
Glucose: 107 mg/dL — ABNORMAL HIGH (ref 70–99)
Glucose: 109 mg/dL — ABNORMAL HIGH (ref 70–99)
Osmolality Calc: 276 mOsm/kg (ref 275–300)
Osmolality Calc: 273 mOsm/kg — ABNORMAL LOW (ref 275–300)
Potassium: 3.9 mMol/L (ref 3.5–5.3)
Potassium: 3.9 mMol/L (ref 3.5–5.3)
Sodium: 139 mMol/L (ref 136–147)
Sodium: 137 mMol/L (ref 136–147)

## 2015-04-18 LAB — VH URINALYSIS WITH MICROSCOPIC AND CULTURE IF INDICATED
Bilirubin, UA: NEGATIVE
Blood, UA: NEGATIVE
Glucose, UA: NEGATIVE mg/dL
Ketones UA: NEGATIVE mg/dL
Leukocyte Esterase, UA: NEGATIVE Leu/uL
Nitrite, UA: NEGATIVE
Protein, UR: NEGATIVE mg/dL
Urine Specific Gravity: 1.014 (ref 1.001–1.040)
Urobilinogen, UA: NORMAL mg/dL
pH, Urine: 7 pH (ref 5.0–8.0)

## 2015-04-18 LAB — LIPID PANEL
Cholesterol: 183 mg/dL (ref 75–199)
Coronary Heart Disease Risk: 3.81
HDL: 48 mg/dL (ref 45–65)
LDL Calculated: 114 mg/dL
Triglycerides: 106 mg/dL (ref 10–150)
VLDL: 21 (ref 0–40)

## 2015-04-18 LAB — HEPATIC FUNCTION PANEL
ALT: 68 U/L — ABNORMAL HIGH (ref 0–55)
AST (SGOT): 61 U/L — ABNORMAL HIGH (ref 10–42)
Albumin/Globulin Ratio: 1.03 Ratio (ref 0.70–1.50)
Albumin: 3.2 gm/dL — ABNORMAL LOW (ref 3.5–5.0)
Alkaline Phosphatase: 139 U/L (ref 40–145)
Bilirubin Direct: 0.4 mg/dL — ABNORMAL HIGH (ref 0.0–0.3)
Bilirubin, Total: 1.1 mg/dL (ref 0.1–1.2)
Globulin: 3.1 gm/dL (ref 2.0–4.0)
Protein, Total: 6.3 gm/dL (ref 6.0–8.3)

## 2015-04-18 LAB — CALCIUM, IONIZED: Calcium, Ionized: 4.78 mg/dL (ref 4.35–5.10)

## 2015-04-18 LAB — LIPASE: Lipase: 385 U/L — ABNORMAL HIGH (ref 8–78)

## 2015-04-18 LAB — MAGNESIUM: Magnesium: 1.4 mg/dL — ABNORMAL LOW (ref 1.6–2.6)

## 2015-04-18 LAB — HEMOGLOBIN AND HEMATOCRIT, BLOOD
Hematocrit: 40.6 % (ref 36.0–48.0)
Hemoglobin: 14.1 gm/dL (ref 12.0–16.0)

## 2015-04-18 MED ORDER — MAGNESIUM OXIDE 400 MG PO TABS
400.0000 mg | ORAL_TABLET | Freq: Two times a day (BID) | ORAL | Status: AC
Start: 2015-04-18 — End: 2015-04-20
  Administered 2015-04-18 – 2015-04-20 (×6): 400 mg via ORAL
  Filled 2015-04-18 (×7): qty 1

## 2015-04-18 NOTE — Progress Notes (Signed)
IVF 2500, output 300, wt gain 6 pounds since yesterday on admit, congested cough, MD notified, no new orders, states he will notify day shift MD, no s\s distress

## 2015-04-18 NOTE — Progress Notes (Signed)
PROGRESS [facility] - [facility]    Date Time: 04/18/2015 9:33 AM  Patient Name: [name]  Attending Physician: Donnie Coffin DO  MRN: [id number]       DOB: 02/02/1961      Assessment and Plan:                                                                                                 [facility]        Active Problems:    Acute pancreatitis    Acute Pancreatitis likely due to alcohol use improving slowly  IVF @ 250/hr we will continue  CT shows acute pancreatitis  Pain control--requiring Dilaudid but somewhat  Lipase 837-->385  Nothing by mouth Will advance to clear liquid diet  Recheck lipase in the morning    Nausea vomiting and some diarrhea-resolved  Antiemetics as needed    Low magnesium  Recheck in a.m.  Start on mag ox    Fluid overload?  There is a question of patient gaining 6 pounds overnight due to fluids. Urine output is only 400 which is adequate. Suspect patient was significantly dehydrated after night binge drinking. We will monitor this closely and check postvoid residual    Elevated LFTs improved  Will monitor closely  Liver is diffusely decreased in attenuation compatible with steatosis and contains an approximately 15 mm  hypodensity in the inferior aspect of the right lobe. This is incompletely evaluated on the current study and further evaluation with elective hepatic mass protocol CT or MRI is recommended after the resolution of acute illness.    Hypertension  Continue home medications with holding parameters    History of chronically low potassium  Replete potassium as needed  check an a.m.    Tobacco use  Smoking cessation counseling given  Started on nicotine patches    ETOH abuse  Monitor on CIWA protocol  She states she only drinks about once a week but a significant amount if she does.     Asthma with no exacerbation  Continue bronchodilators as needed    Nutrition: Clear liquid diet  GI Prophylaxis: not indicated  DVT Prophylaxis: low molecular weight  heparin and SCD's while in bed  Code Status: do not resuscitate     Time spent with patient 37  Labs have been ordered for the morning  Nursing orders have been changed and reviewed  Labs and other studies were personally reviewed  MEDICATION changes were made and/or added  Case was discussed with RN    I have spent 37 with the patient discussing the principle problem (listed above), the active hospital problems (listed above) and discharge planning issues. Greater than 50% of the time was spent counseling the patient and coordinating care with Nursing staff (RN/LPN).                     Subjective  [facility]     CC: abdominal pain    Patient states her abdominal pain has improved but she still has back pain with it. She suspects that this is contributing to her back pain. She states her nausea is improved but she hasn't eaten anything. She denies any new complaints.    Review of Systems:    Constitutional:  Denies fever or fatigue.    ENT:  Denies sore throat or trouble swallowing.    CV:  Denies chest pain or palpitations.    Respiratory:  Denies SOB or cough.    GI:  See above    GU:  Denies pain or burning with urination.    MS:  Denies new muscle or joint pain.    Skin:  Denies rash.    Heme: Denies any bleeding.    Physical Exam:                                                                              [facility] Hospitalists   Patient Vitals for the past 24 hrs:   BP Temp Temp src Pulse Resp SpO2 Height Weight   04/18/15 0638 (!) 133/94 mmHg 98.1 F (36.7 C) Oral 75 19 90 % - -   04/18/15 0556 - - - - 18 94 % - -   04/18/15 0350 - - - - 16 - - -   04/18/15 0256 143/77 mmHg 98.1 F (36.7 C) Oral 60 16 96 % - 87.8 kg (193 lb 9 oz)   04/17/15 2225 124/90 mmHg 98.1 F (36.7 C) Oral 70 16 93 % - -   04/17/15 2010 (!) 144/106 mmHg 98.2 F (36.8 C) Oral 69 18 95 % - -   04/17/15 1753 123/79 mmHg 97.9 F (36.6 C) Oral -  16 94 % - -   04/17/15 1700 136/85 mmHg 97.6 F (36.4 C) Oral - 16 95 % - -   04/17/15 1630 131/85 mmHg 97.7 F (36.5 C) Oral - 16 96 % - -   04/17/15 1510 141/90 mmHg - - 84 18 97 % - -   04/17/15 1349 (!) 163/118 mmHg 97.6 F (36.4 C) - 83 18 95 % 1.651 m (5\' 5" ) 84.8 kg (186 lb 15.2 oz)        Intake/Output Summary (Last 24 hours) at 04/18/15 0933  Last data filed at 04/18/15 0600   Gross per 24 hour   Intake 2462.5 ml   Output    400 ml   Net 2062.5 ml     Wt Readings from Last 1 Encounters:   04/18/15 87.8 kg (193 lb 9 oz)        Constitutional: Well groomed and in no acute distress.    Psych: Alert and oriented X3. Affect is not depressed/anxious.    Eyes: Conjunctiva is pink without purulent drainage.    ENT: Hearing is grossly normal. Mucosa is moist.    CV: Heart shows regular rate and rhythm. Pedal pulses are 2+. Pedal edema absent.    Lungs: Clear to ascultation. No accessory muscle use.    Abd: Positive epigastric pain appears to be improved hypoactive bowel sounds..    MS: She is able to  ambulate.     Skin: No rash. No nodules.      Meds:                                                                                              [facility]     Estimated Creatinine Clearance: 90.2 mL/min (based on Cr of 0.78).    Current Facility-Administered Medications   Medication Dose Route Frequency   . enoxaparin  40 mg Subcutaneous Q24H   . [START ON 04/19/2015] folic acid  1 mg Oral Daily   . labetalol  200 mg Oral BID   . magnesium oxide  400 mg Oral BID   . multivitamin  1 tablet Oral QAM   . nicotine  1 patch Transdermal Daily   . pantoprazole  40 mg Oral Daily   . potassium chloride  20 mEq Oral BID   . vitamin B-1  100 mg Oral Daily       PRN medications: HYDROmorphone, LORazepam, ondansetron **OR** ondansetron, promethazine **OR** promethazine **OR** promethazine    IV Drips:    . lactated ringers 250 mL/hr at 04/18/15 0330       Labs and Imaging:                                                                         [facility]     RECENT LABS (from the last 7 days)    Recent Labs  Lab 04/18/15  0244 04/17/15  1927 04/17/15  1359   WBC 8.2  --  9.5   HEMOGLOBIN 14.0  14.1 14.9 16.1*   HEMATOCRIT 40.3  40.6 41.3 45.1   PLT CT 236  --  278           Recent Labs  Lab 04/17/15  2241 04/17/15  1927   TROPONIN I <0.01 0.01          Recent Labs  Lab 04/18/15  0244 04/17/15  1927   GLUCOSE 109*  107* 111*   SODIUM 137  139 136   POTASSIUM 3.9  3.9 3.7   CHLORIDE 106  107 105   CO2 21.4  21.6 20.3   BUN 8  7 8    CREATININE 0.78  0.81 0.82   EGFR >60  >60 >60   CALCIUM 8.7  8.8 8.7       Recent Labs  Lab 04/18/15  0244   MAGNESIUM 1.4*       Recent Labs  Lab 04/18/15  0244 04/17/15  1359   ALBUMIN 3.2* 3.9   PROTEIN, TOTAL 6.3 7.6   BILIRUBIN, TOTAL 1.1 0.8   ALKALINE PHOSPHATASE 139 171*   ALT 68* 92*   AST (SGOT) 61* 96*      Recent Labs  Lab 04/17/15  1730   SPECIFIC GRAVITY, UR 1.008   PH, URINE 6.1  Lab Results   Component Value Date    HGBA1CPERCNT 5.3 04/19/2012          Imaging, reviewed and are significant for:  CT Abdomen Pelvis with IV Cont   Final Result   1.  There are peripancreatic inflammatory changes compatible with acute pancreatitis. No portal vein thrombosis or   pancreatic necrosis is identified. Dilatation of the pancreatic duct and common bile duct is unchanged from prior study.   2.  Liver is diffusely decreased in attenuation compatible with steatosis and contains an approximately 15 mm   hypodensity in the inferior aspect of the right lobe. This is incompletely evaluated on the current study and further   evaluation with elective hepatic mass protocol CT or MRI is recommended after the resolution of acute illness.   3.  There is a ventral hernia just left of the umbilicus which measures approximately 7 cm in greatest dimension and   contains a short segment of nonobstructed small bowel. There are also small fat-containing umbilical and bilateral   inguinal hernias  without evidence of incarceration.   4.  Additional incidental findings as detailed above.            ReadingStation:[id number]          Microbiology Results     None            Donnie Coffin, DO  [facility]  [address]  [place], [place]  [facility] [address]  Pager [phone]

## 2015-04-18 NOTE — Plan of Care (Addendum)
Assumed care of patient at 1900.  Patient sitting up in bed, no signs of distress, denies pain or any other needs at this time, will continue to monitor. Call bell within reach, sr x 3.     2300  Upon entry into room, found patient had removed her O2 nasal canula.  Vital signs taken, o2 sat at 50%. Sat patient up, increased o2 to 3L. Patient recovered to 91%. Rechecked temperature at 99.7. Will continue to monitor.

## 2015-04-18 NOTE — Plan of Care (Signed)
Problem: Pain  Goal: Patient's pain/discomfort is manageable  Outcome: Progressing    Problem: Psychosocial and Spiritual Needs  Goal: Demonstrates ability to cope with hospitalization/illness  Outcome: Progressing    Problem: Safe medical management of withdrawal from (list substances of abuse) AS EVIDENCED BY...  Goal: Safe detoxification/education about relapse prevention/engagement in discharge planning  Outcome: Progressing  Goal: Compliance with management of withdrawal syndrome  Outcome: Progressing    Problem: Moderate/High Fall Risk Score >5  Goal: Patient will remain free of falls  Outcome: Progressing    Problem: Altered GI Function  Goal: Fluid and electrolyte balance are achieved/maintained  Outcome: Progressing  IVF 250 ml/hr

## 2015-04-18 NOTE — Plan of Care (Signed)
Problem: Altered GI Function  Goal: Fluid and electrolyte balance are achieved/maintained  Outcome: Progressing  Pt advanced to clear liquid diet. No nausea this AM.

## 2015-04-18 NOTE — Progress Notes (Signed)
Medicated with PRN phenergan, zofran and dilaudid, NPO, meds per order with no s\s adverse reaction noted

## 2015-04-19 LAB — CBC AND DIFFERENTIAL
Basophils %: 0.3 % (ref 0.0–3.0)
Basophils Absolute: 0 10*3/uL (ref 0.0–0.3)
Eosinophils %: 2.4 % (ref 0.0–7.0)
Eosinophils Absolute: 0.2 10*3/uL (ref 0.0–0.8)
Hematocrit: 39.2 % (ref 36.0–48.0)
Hemoglobin: 13.7 gm/dL (ref 12.0–16.0)
Lymphocytes Absolute: 1.4 10*3/uL (ref 0.6–5.1)
Lymphocytes: 14 % — ABNORMAL LOW (ref 15.0–46.0)
MCH: 39 pg — ABNORMAL HIGH (ref 28–35)
MCHC: 35 gm/dL (ref 32–36)
MCV: 111 fL — ABNORMAL HIGH (ref 80–100)
MPV: 6.9 fL (ref 6.0–10.0)
Monocytes Absolute: 0.9 10*3/uL (ref 0.1–1.7)
Monocytes: 8.9 % (ref 3.0–15.0)
Neutrophils %: 74.4 % (ref 42.0–78.0)
Neutrophils Absolute: 7.6 10*3/uL (ref 1.7–8.6)
PLT CT: 223 10*3/uL (ref 130–440)
RBC: 3.52 10*6/uL — ABNORMAL LOW (ref 3.80–5.00)
RDW: 13.3 % (ref 11.0–14.0)
WBC: 10.3 10*3/uL (ref 4.0–11.0)

## 2015-04-19 LAB — MAGNESIUM: Magnesium: 1.3 mg/dL — ABNORMAL LOW (ref 1.6–2.6)

## 2015-04-19 LAB — HEPATIC FUNCTION PANEL
ALT: 49 U/L (ref 0–55)
AST (SGOT): 41 U/L (ref 10–42)
Albumin/Globulin Ratio: 0.97 Ratio (ref 0.70–1.50)
Albumin: 3 gm/dL — ABNORMAL LOW (ref 3.5–5.0)
Alkaline Phosphatase: 138 U/L (ref 40–145)
Bilirubin Direct: 0.5 mg/dL — ABNORMAL HIGH (ref 0.0–0.3)
Bilirubin, Total: 1.1 mg/dL (ref 0.1–1.2)
Globulin: 3.1 gm/dL (ref 2.0–4.0)
Protein, Total: 6.1 gm/dL (ref 6.0–8.3)

## 2015-04-19 LAB — BASIC METABOLIC PANEL
Anion Gap: 13 mMol/L (ref 7.0–18.0)
BUN / Creatinine Ratio: 9.2 Ratio — ABNORMAL LOW (ref 10.0–30.0)
BUN: 7 mg/dL (ref 7–22)
CO2: 22.3 mMol/L (ref 20.0–30.0)
Calcium: 8.7 mg/dL (ref 8.5–10.5)
Chloride: 108 mMol/L (ref 98–110)
Creatinine: 0.76 mg/dL (ref 0.60–1.20)
EGFR: >60 mL/min/{1.73_m2}
Glucose: 83 mg/dL (ref 70–99)
Osmolality Calc: 275 mOsm/kg (ref 275–300)
Potassium: 4.3 mMol/L (ref 3.5–5.3)
Sodium: 139 mMol/L (ref 136–147)

## 2015-04-19 LAB — LIPASE: Lipase: 100 U/L — ABNORMAL HIGH (ref 8–78)

## 2015-04-19 MED ORDER — FUROSEMIDE 10 MG/ML IJ SOLN
20.0000 mg | Freq: Once | INTRAMUSCULAR | Status: AC
Start: 2015-04-19 — End: 2015-04-19
  Filled 2015-04-19: qty 2

## 2015-04-19 MED ORDER — FUROSEMIDE 10 MG/ML IJ SOLN
INTRAMUSCULAR | Status: AC
Start: 2015-04-19 — End: 2015-04-19
  Administered 2015-04-19: 20 mg via INTRAVENOUS
  Filled 2015-04-19: qty 4

## 2015-04-19 MED ORDER — VH MAGNESIUM SULFATE 2 G IN 50 ML IV PREMIX
2.0000 g | INTRAVENOUS | Status: AC
Start: 2015-04-19 — End: 2015-04-19
  Administered 2015-04-19 (×2): 2 g via INTRAVENOUS
  Filled 2015-04-19 (×3): qty 50

## 2015-04-19 MED ORDER — ALBUTEROL-IPRATROPIUM 2.5-0.5 (3) MG/3ML IN SOLN
3.0000 mL | Freq: Four times a day (QID) | RESPIRATORY_TRACT | Status: DC
Start: 2015-04-19 — End: 2015-04-22
  Administered 2015-04-19 – 2015-04-22 (×12): 3 mL via RESPIRATORY_TRACT
  Filled 2015-04-19 (×17): qty 3

## 2015-04-19 MED ORDER — HYDROCODONE-ACETAMINOPHEN 5-325 MG PO TABS
1.0000 | ORAL_TABLET | Freq: Four times a day (QID) | ORAL | Status: DC | PRN
Start: 2015-04-19 — End: 2015-04-22
  Administered 2015-04-19 – 2015-04-20 (×4): 1 via ORAL
  Filled 2015-04-19 (×4): qty 1

## 2015-04-19 MED ORDER — ALBUTEROL SULFATE (2.5 MG/3ML) 0.083% IN NEBU
2.5000 mg | INHALATION_SOLUTION | Freq: Four times a day (QID) | RESPIRATORY_TRACT | Status: DC | PRN
Start: 2015-04-19 — End: 2015-04-22

## 2015-04-19 NOTE — Plan of Care (Signed)
Pt feeling better. Pain in abdomen worse when coughing. Pt does have wheezing present in both lungs. Pt c/o headache. Hot tea offered to assist.

## 2015-04-19 NOTE — Plan of Care (Signed)
Pt found with oxygen off and sats in the 60's. Pt was placed on 5 liters nasal canula sats maintained in the low 90's. RT called to the bedside. Placed pt on oxymask at 10 liters. Sats at 90-91%. Dr. Jean Rosenthal called. Received orders for lasix iv. Medication as administered and pt placed on 12 liters oxymask. Will continue to monitor.

## 2015-04-19 NOTE — Progress Notes (Signed)
PRO[facility]    Date Time: 04/19/2015 5:44 PM  Patient Name: [name]  Attending Physician: Herb Grays  MRN: [id number]       DOB: [birth year]      Assessment and Plan:                                                                                                 [facility]        Active Problems:    Acute pancreatitis    Fluid overload vs COPD exacerbation  We'll start patient on Lasix now  Will evaluate patient's blood pressure is mildly low at 94/64 W this closely  patient is on Oxymizer  Continue nebulizer treatments  We'll follow this closely was called about this this afternoon. RT is involved.    Acute Pancreatitis significantly improved  Stop IV fluids  CT shows acute pancreatitis  Pain control--change Dilaudid to Norco  Lipase 837-->385 to 100  Advance diet  Recheck lipase in the morning    Nausea vomiting and some diarrhea-resolved  Antiemetics as needed    Low magnesium  Recheck in a.m.  Start on mag ox  Replace IV 1.4    Elevated LFTs improved  Will monitor closely  Liver is diffusely decreased in attenuation compatible with steatosis and contains an approximately 15 mm  hypodensity in the inferior aspect of the right lobe. This is incompletely evaluated on the current study and further evaluation with elective hepatic mass protocol CT or MRI is recommended after the resolution of acute illness.    Hypertension  Continue home medications with holding parameters    History of chronically low potassium  Replete potassium as needed  check an a.m.    Tobacco use  Smoking cessation counseling given  Continue on nicotine patches    ETOH abuse  Monitor on CIWA protocol--- unlikely to withdrawal  She states she only drinks about once a week but a significant amount if she does.       Needs a work note    Nutrition: Clear liquid diet  GI Prophylaxis: not indicated  DVT Prophylaxis: low molecular weight heparin and SCD's while in bed  Code Status: do not  resuscitate     Time spent with patient 74  Labs have been ordered for the morning  Nursing orders have been changed and reviewed  Labs and other studies were personally reviewed  MEDICATION changes were made and/or added  Case was discussed with RN    I have spent 38 with the patient discussing the principle problem (listed above), the active hospital problems (listed above) and discharge planning issues. Greater than 50% of the time was spent counseling the patient and coordinating care with Nursing staff (RN/LPN).                     Subjective  [facility] Hospital[facility]     CC: abdominal pain    She states she feels well. She is willing to try a full liquid diet. She denies shortness of breath she denies chest pain. She is asking when she can go home.    Review of Systems:    Constitutional:  Denies fever or fatigue.    ENT:  Denies sore throat or trouble swallowing.    CV:  Denies chest pain or palpitations.    Respiratory:  Denies SOB or cough.    GI:  See above    GU:  Denies pain or burning with urination.    MS:  Denies new muscle or joint pain.    Skin:  Denies rash.    Heme: Denies any bleeding.    Physical Exam:                                                                              [facility] Hospitalists     Patient Vitals for the past 24 hrs:   BP Temp Temp src Pulse Resp SpO2 Weight   04/19/15 1544 - - - - - (!) 85 % -   04/19/15 1543 - - - - - (!) 80 % -   04/19/15 1542 - - - - - (!) 70 % -   04/19/15 1541 94/64 mmHg 98.6 F (37 C) Oral 94 18 (!) 60 % -   04/19/15 1108 - - - - - 93 % -   04/19/15 1107 - - - - - (!) 89 % -   04/19/15 0725 123/72 mmHg 97.8 F (36.6 C) Oral 76 18 91 % -   04/19/15 0619 - - - - - - 89.223 kg (196 lb 11.2 oz)   04/18/15 2311 - 99.7 F (37.6 C) Oral - - - -   04/18/15 2306 117/66 mmHg 100.5 F (38.1 C) Oral 90 22 91 % -   04/18/15 1821 120/75 mmHg - - 88 - - -        Intake/Output Summary  (Last 24 hours) at 04/19/15 1744  Last data filed at 04/19/15 1723   Gross per 24 hour   Intake   7191 ml   Output   2000 ml   Net   5191 ml     Wt Readings from Last 1 Encounters:   04/19/15 89.223 kg (196 lb 11.2 oz)        Constitutional: Well groomed and in no acute distress.    Psych: Alert and oriented X3. Affect is not depressed/anxious.    Eyes: Conjunctiva is pink without purulent drainage.    ENT: Hearing is grossly normal. Mucosa is moist.    CV: Heart shows regular rate and rhythm. Pedal pulses are 2+. Pedal edema absent.    Lungs: Clear to ascultation. No accessory muscle use.    Abd: minimal epigastric pain; normoactive bowel sounds..    MS: She is able to ambulate.     Skin: No rash. No nodules.      Meds:  [facility]     Estimated Creatinine Clearance: 93.4 mL/min (based on Cr of 0.76).    Current Facility-Administered Medications   Medication Dose Route Frequency   . albuterol-ipratropium  3 mL Nebulization Q6H SCH   . enoxaparin  40 mg Subcutaneous Q24H   . folic acid  1 mg Oral Daily   . labetalol  200 mg Oral BID   . magnesium oxide  400 mg Oral BID   . multivitamin  1 tablet Oral QAM   . nicotine  1 patch Transdermal Daily   . pantoprazole  40 mg Oral Daily   . potassium chloride  20 mEq Oral BID   . vitamin B-1  100 mg Oral Daily       PRN medications: albuterol, HYDROmorphone, LORazepam, ondansetron **OR** ondansetron, promethazine **OR** promethazine **OR** promethazine    IV Drips:         Labs and Imaging:                                                                        [facility] Hospitalists     RECENT LABS (from the last 7 days)    Recent Labs  Lab 04/19/15  0700 04/18/15  0244   WBC 10.3 8.2   HEMOGLOBIN 13.7 14.0  14.1   HEMATOCRIT 39.2 40.3  40.6   PLT CT 223 236           Recent Labs  Lab 04/17/15  2241 04/17/15  1927   TROPONIN I <0.01 0.01          Recent Labs  Lab  04/19/15  0700 04/18/15  0244   GLUCOSE 83 109*  107*   SODIUM 139 137  139   POTASSIUM 4.3 3.9  3.9   CHLORIDE 108 106  107   CO2 22.3 21.4  21.6   BUN 7 8  7    CREATININE 0.76 0.78  0.81   EGFR >60 >60  >60   CALCIUM 8.7 8.7  8.8       Recent Labs  Lab 04/19/15  0700 04/18/15  0244   MAGNESIUM 1.3* 1.4*       Recent Labs  Lab 04/19/15  0700 04/18/15  0244   ALBUMIN 3.0* 3.2*   PROTEIN, TOTAL 6.1 6.3   BILIRUBIN, TOTAL 1.1 1.1   ALKALINE PHOSPHATASE 138 139   ALT 49 68*   AST (SGOT) 41 61*      Recent Labs  Lab 04/18/15  1401   SPECIFIC GRAVITY, UR 1.014   PH, URINE 7.0   PROTEIN, UR Negative   GLUCOSE, UA Negative   KETONES UA Negative   BILIRUBIN, UA Negative   BLOOD, UA Negative   NITRITE, UA Negative   UROBILINOGEN, UA Normal   LEUKOCYTE ESTERASE, UA Negative      Lab Results   Component Value Date    HGBA1CPERCNT 5.3 04/19/2012          Imaging, reviewed and are significant for:  CT Abdomen Pelvis with IV Cont   Final Result   1.  There are peripancreatic inflammatory changes compatible with acute pancreatitis. No portal vein thrombosis or   pancreatic necrosis is identified. Dilatation of the pancreatic duct and common bile  duct is unchanged from prior study.   2.  Liver is diffusely decreased in attenuation compatible with steatosis and contains an approximately 15 mm   hypodensity in the inferior aspect of the right lobe. This is incompletely evaluated on the current study and further   evaluation with elective hepatic mass protocol CT or MRI is recommended after the resolution of acute illness.   3.  There is a ventral hernia just left of the umbilicus which measures approximately 7 cm in greatest dimension and   contains a short segment of nonobstructed small bowel. There are also small fat-containing umbilical and bilateral   inguinal hernias without evidence of incarceration.   4.  Additional incidental findings as detailed above.            ReadingStation:[id number]          Microbiology Results      None            Donnie Coffin, DO  [facility]  [address]  [place], [place]  [facility] [address]  Pager [phone]

## 2015-04-19 NOTE — UM Notes (Signed)
[facility] Utilization Management Review Sheet    NAME: [name]  MR#: [id number]    CSN#: [id number]    ROOM: 503/503-A AGE: 55 y.o.    ADMIT DATE AND TIME: 04/17/2015  2:14 PM      PATIENT CLASS:Inpatient     ATTENDING PHYSICIAN: Donnie Coffin, DO  PAYOR:Payor: [employer] / Plan: [employer] POS / Product Type: *No Product type* /       AUTH #:     DIAGNOSIS:     ICD-10-CM    1. 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 Liver mass R16.0        HISTORY:   Past Medical History   Diagnosis Date   . Hypertension    . Asthma without status asthmaticus    . Gastroesophageal reflux disease    . Pancreatitis    . DVT (deep venous thrombosis)    . S/P IVC filter    . Clostridium difficile colitis    . H/O ETOH abuse      sober 2 years until 03/2014       DATE OF REVIEW: 04/19/2015    VITALS: BP 123/72 mmHg  Pulse 76  Temp(Src) 97.8 F (36.6 C) (Oral)  Resp 18  Ht 1.651 m (5\' 5" )  Wt 89.223 kg (196 lb 11.2 oz)  BMI 32.73 kg/m2  SpO2 91%     Admitted via the ED "55 y.o. female presents with upper abdominal and left-sided abdominal pain that started last night.. The patient has a history of alcoholic pancreatitis and does admit that she drank last night. She says she has been sober but only drinks intermittently. She is here with her roommate. On arrival she is in pain and said she had an episode of nausea and vomiting and diarrhea all this morning."    Labs: alkaline phosphatase 171, ALT 92, AST 96, lipase 837, glucose 129, amylase 287, albumin 3.2    CT abdomen and pelvis:   "1. There are peripancreatic inflammatory changes compatible with acute pancreatitis. No portal vein thrombosis or  pancreatic necrosis is identified. Dilatation of the pancreatic duct and common bile duct is unchanged from prior study.  2. Liver is diffusely decreased in attenuation compatible with steatosis and contains an approximately 15 mm  hypodensity in the inferior aspect of the right lobe. This is  incompletely evaluated on the current study and further  evaluation with elective hepatic mass protocol CT or MRI is recommended after the resolution of acute illness.  3. There is a ventral hernia just left of the umbilicus which measures approximately 7 cm in greatest dimension and  contains a short segment of nonobstructed small bowel. There are also small fat-containing umbilical and bilateral  inguinal hernias without evidence of incarceration."    Meds: lovenox sc qd, folvite qd, dilaudid x1 in ED, zofran x1 in ED, protonix qd, IV fluid bolus x1 liter in ED, IV fluids at 21ml/hr  Dilaudid x 5 on 01/08 and x1 on 01/09    Plans: IV fluids at 226ml/hr, manage pain, manage nausea, clear liquid diet, dvt prophylaxis, CIWA lorazepam protocol, monitor lipase and LFT's, bronchodilators prn    Alvin Critchley RN  [facility]  Utilization Review  P [phone]  F [phone]

## 2015-04-19 NOTE — Progress Notes (Signed)
[facility]   [address]   [place] [address]     INITIAL ASSESSMENT  Case Management       Estimated D/C Date:     04/21/15   RX Coverage:       Patient has insurance with RX coverage   Inpatient Plan of Care:      Patient admitted with acute pancreatitis. Possibly alcohol induced. IVF, Pain control, advance diet.   CM Interventions:      Patient plans to return home with family support. Denies HH needs at this time.  Will continue to follow pending medical course.               04/19/15 1303   Patient Type   Within 30 Days of Previous Admission? No   Medicare focused diagnosis patient? Not a Medicare focused diagnosis patient   Bundle patient? Not a bundle patient   Healthcare Decisions   Interviewed: Patient   Interviewee Contact Information: self   Orientation/Decision Making Abilities of Patient Alert and Oriented x3, able to make decisions   Prior to admission   Prior level of function Independent with ADLs;Ambulates independently   Type of Residence Private residence   Home Layout Two level;Stairs to enter with rails (add number in comment)  (4-8 Steps depending upon entrance used)   Have running water, electricity, heat, etc? Yes   Living Arrangements Friends   Dressing Independent   Grooming Independent   Feeding Independent   Bathing Independent   Toileting Independent   DME Currently at Home Single point cane  (Patient has cane but does not use)   Home Care/Community Services None   Discharge Planning   Support Systems Friends/neighbors   Patient expects to be discharged to: Home   Anticipated [place] plan discussed with: Same as interviewed   Follow up appointment scheduled? Yes   Follow up appointment scheduled with: PCP   Mode of transportation: Private car (family member)   Consults/Providers   Correct PCP listed in Epic? Yes  (Last seen in Sept)   Lorraine Lax, RN, MSN  Case Manager  [facility]   [phone]

## 2015-04-19 NOTE — Plan of Care (Signed)
Pt sats rmain 92 % on 12 oxy mask. Pt up and urinating well with iv lasix. Pt denies any needs.

## 2015-04-20 LAB — LIPASE: Lipase: 53 U/L (ref 8–78)

## 2015-04-20 LAB — CBC AND DIFFERENTIAL
Basophils %: 0.4 % (ref 0.0–3.0)
Basophils Absolute: 0 10*3/uL (ref 0.0–0.3)
Eosinophils %: 2.5 % (ref 0.0–7.0)
Eosinophils Absolute: 0.2 10*3/uL (ref 0.0–0.8)
Hematocrit: 37.2 % (ref 36.0–48.0)
Hemoglobin: 12.5 gm/dL (ref 12.0–16.0)
Lymphocytes Absolute: 1.3 10*3/uL (ref 0.6–5.1)
Lymphocytes: 14.7 % — ABNORMAL LOW (ref 15.0–46.0)
MCH: 37 pg — ABNORMAL HIGH (ref 28–35)
MCHC: 34 gm/dL (ref 32–36)
MCV: 111 fL — ABNORMAL HIGH (ref 80–100)
MPV: 7.1 fL (ref 6.0–10.0)
Monocytes Absolute: 0.7 10*3/uL (ref 0.1–1.7)
Monocytes: 8 % (ref 3.0–15.0)
Neutrophils %: 74.3 % (ref 42.0–78.0)
Neutrophils Absolute: 6.4 10*3/uL (ref 1.7–8.6)
PLT CT: 197 10*3/uL (ref 130–440)
RBC: 3.34 10*6/uL — ABNORMAL LOW (ref 3.80–5.00)
RDW: 13.1 % (ref 11.0–14.0)
WBC: 8.7 10*3/uL (ref 4.0–11.0)

## 2015-04-20 LAB — BASIC METABOLIC PANEL
Anion Gap: 10.7 mMol/L (ref 7.0–18.0)
BUN / Creatinine Ratio: 9.1 Ratio — ABNORMAL LOW (ref 10.0–30.0)
BUN: 6 mg/dL — ABNORMAL LOW (ref 7–22)
CO2: 27.4 mMol/L (ref 20.0–30.0)
Calcium: 8.6 mg/dL (ref 8.5–10.5)
Chloride: 103 mMol/L (ref 98–110)
Creatinine: 0.66 mg/dL (ref 0.60–1.20)
EGFR: >60 mL/min/{1.73_m2}
Glucose: 90 mg/dL (ref 70–99)
Osmolality Calc: 271 mOsm/kg — ABNORMAL LOW (ref 275–300)
Potassium: 4.1 mMol/L (ref 3.5–5.3)
Sodium: 137 mMol/L (ref 136–147)

## 2015-04-20 LAB — MAGNESIUM: Magnesium: 1.6 mg/dL (ref 1.6–2.6)

## 2015-04-20 MED ORDER — METHYLPREDNISOLONE SODIUM SUCC 40 MG IJ SOLR
40.0000 mg | Freq: Four times a day (QID) | INTRAMUSCULAR | Status: DC
Start: 2015-04-20 — End: 2015-04-20
  Filled 2015-04-20 (×3): qty 1

## 2015-04-20 MED ORDER — FUROSEMIDE 10 MG/ML IJ SOLN
40.0000 mg | Freq: Once | INTRAMUSCULAR | Status: AC
Start: 2015-04-20 — End: 2015-04-20
  Administered 2015-04-20: 40 mg via INTRAVENOUS
  Filled 2015-04-20 (×2): qty 4

## 2015-04-20 MED ORDER — FUROSEMIDE 10 MG/ML IJ SOLN
40.0000 mg | Freq: Two times a day (BID) | INTRAMUSCULAR | Status: DC
Start: 2015-04-20 — End: 2015-04-21
  Administered 2015-04-20 – 2015-04-21 (×3): 40 mg via INTRAVENOUS
  Filled 2015-04-20 (×4): qty 4

## 2015-04-20 MED ORDER — FUROSEMIDE 10 MG/ML IJ SOLN
INTRAMUSCULAR | Status: AC
Start: 2015-04-20 — End: 2015-04-20
  Administered 2015-04-20: 10 mg
  Filled 2015-04-20: qty 4

## 2015-04-20 NOTE — Plan of Care (Signed)
Assumed pt care at 1900. Pt woke to take her scheduled medication and complained of pain 6/10 in her abdomen, I medicated her per mar. Pt denies other needs at this time. Bed alarm on. Call bell in reach. Will monitor.

## 2015-04-20 NOTE — Plan of Care (Signed)
Problem: Safety  Goal: Patient will be free from injury during hospitalization  Outcome: Progressing      Problem: Altered GI Function  Goal: Fluid and electrolyte balance are achieved/maintained  Outcome: Progressing

## 2015-04-20 NOTE — Plan of Care (Signed)
Assumed pt care at 1900. Pt is resting in bed, assessment complete. Pt complains of pain 7/10 in her back, I medicated her per mar. Pt denies other needs at this time. Bed alarm on. Call bell in reach. Will monitor.

## 2015-04-20 NOTE — Plan of Care (Signed)
Problem: Bronchoconstriction  Goal:  Improve Breath Sounds/ Decrease Wheezing  Outcome: In Progress

## 2015-04-20 NOTE — Progress Notes (Signed)
PROGRESS [facility] - [facility]    Date Time: 04/20/2015 5:38 PM  Patient Name: [name]  Attending Physician: Herb Grays  MRN: [id number]       DOB: [birth year]      Assessment and Plan:                                                                                                 [facility]        Active Problems:    Acute pancreatitis    Fluid overload vs COPD exacerbation  Improved with lasix.  Will cont this  Agents blood pressures have improved  patient is on Oxymizer but improved from 15-8 L now  Continue nebulizer treatments   RT is involved.    Acute Pancreatitis significantly improved  Stop IV fluids  CT shows acute pancreatitis  Pain control--change Dilaudid to Norco  Lipase normalized  Advance diet      Nausea vomiting and some diarrhea-resolved  Antiemetics as needed    Low magnesium  Recheck in a.m.  Continue on mag ox  Replace IV 1.4    Elevated LFTs improved  Will monitor closely  Liver is diffusely decreased in attenuation compatible with steatosis and contains an approximately 15 mm  hypodensity in the inferior aspect of the right lobe. This is incompletely evaluated on the current study and further evaluation with elective hepatic mass protocol CT or MRI is recommended after the resolution of acute illness.    Hypertension  Continue home medications with holding parameters    History of chronically low potassium  Replete potassium as needed  check an a.m.    Tobacco use  Smoking cessation counseling given  Continue on nicotine patches    ETOH abuse  Monitor on CIWA protocol--- unlikely to withdrawal  She states she only drinks about once a week but a significant amount if she does.       Needs a work note and FMLA signed    Nutrition: Clear liquid diet  GI Prophylaxis: not indicated  DVT Prophylaxis: low molecular weight heparin and SCD's while in bed  Code Status: do not resuscitate     Time spent with patient 36  Nursing orders have been changed and  reviewed  Labs and other studies were personally reviewed  MEDICATION changes were made and/or added  Case was discussed with RN    I have spent 36 with the patient discussing the principle problem (listed above), the active hospital problems (listed above) and discharge planning issues. Greater than 50% of the time was spent counseling the patient and coordinating care with Nursing staff (RN/LPN).                     Subjective  [facility]     CC: abdominal pain    Patient would like to have a regular diet she's tolerated a brat diet well. She denies shortness of breath or chest pain. She actually wants to go home. She denies any abdominal pain.    Review of Systems:    Constitutional:  Denies fever or fatigue.    ENT:  Denies sore throat or trouble swallowing.    CV:  Denies chest pain or palpitations.    Respiratory:  Denies SOB or cough.    GI:  See above    GU:  Denies pain or burning with urination.    MS:  Denies new muscle or joint pain.    Skin:  Denies rash.    Heme: Denies any bleeding.    Physical Exam:                                                                              [facility]     Patient Vitals for the past 24 hrs:   BP Temp Temp src Pulse Resp SpO2 Weight   04/20/15 1557 125/64 mmHg 98.1 F (36.7 C) Oral 83 18 94 % -   04/20/15 0742 - - - - - 94 % -   04/20/15 0728 143/90 mmHg 97.9 F (36.6 C) Oral 76 20 95 % -   04/20/15 0522 - - - - - - 88.361 kg (194 lb 12.8 oz)   04/20/15 0218 (!) 144/92 mmHg 98.3 F (36.8 C) Oral 76 20 94 % -   04/20/15 0136 - - - - - 98 % -   04/19/15 2227 (!) 150/92 mmHg 97.5 F (36.4 C) Oral 69 20 96 % -   04/19/15 2005 - - - - - 94 % -   04/19/15 1943 - - - - - 96 % -   04/19/15 1846 - - - - - 92 % -   04/19/15 1806 - - - - - 92 % -        Intake/Output Summary (Last 24 hours) at 04/20/15 1738  Last data filed at 04/20/15 0516   Gross per 24 hour   Intake     300 ml   Output   2150 ml   Net  -1850 ml     Wt Readings from Last 1 Encounters:   04/20/15 88.361 kg (194 lb 12.8 oz)        Constitutional: Well groomed and in no acute distress.    Psych: Alert and oriented X3. Affect is not depressed/anxious.    Eyes: Conjunctiva is pink without purulent drainage.    ENT: Hearing is grossly normal. Mucosa is moist.    CV: Heart shows regular rate and rhythm. Pedal pulses are 2+. Pedal edema absent.    Lungs: Crackles in the lower bases bilaterally. No accessory muscle use.    Abd: No epigastric pain; normoactive bowel sounds..    MS: She is able to ambulate.     Skin: No rash. No nodules.      Meds:  [facility]     Estimated Creatinine Clearance: 107.1 mL/min (based on Cr of 0.66).    Current Facility-Administered Medications   Medication Dose Route Frequency   . albuterol-ipratropium  3 mL Nebulization Q6H SCH   . enoxaparin  40 mg Subcutaneous Q24H   . folic acid  1 mg Oral Daily   . furosemide  40 mg Intravenous BID   . labetalol  200 mg Oral BID   . [COMPLETED] magnesium oxide  400 mg Oral BID   . multivitamin  1 tablet Oral QAM   . nicotine  1 patch Transdermal Daily   . pantoprazole  40 mg Oral Daily   . potassium chloride  20 mEq Oral BID   . vitamin B-1  100 mg Oral Daily       PRN medications: albuterol, HYDROcodone-acetaminophen, LORazepam, ondansetron **OR** ondansetron, promethazine **OR** promethazine **OR** promethazine    IV Drips:         Labs and Imaging:                                                                        [facility]     RECENT LABS (from the last 7 days)    Recent Labs  Lab 04/20/15  0618 04/19/15  0700   WBC 8.7 10.3   HEMOGLOBIN 12.5 13.7   HEMATOCRIT 37.2 39.2   PLT CT 197 223           Recent Labs  Lab 04/17/15  2241 04/17/15  1927   TROPONIN I <0.01 0.01          Recent Labs  Lab 04/20/15  0618 04/19/15  0700   GLUCOSE 90 83    SODIUM 137 139   POTASSIUM 4.1 4.3   CHLORIDE 103 108   CO2 27.4 22.3   BUN 6* 7   CREATININE 0.66 0.76   EGFR >60 >60   CALCIUM 8.6 8.7       Recent Labs  Lab 04/20/15  0618 04/19/15  0700   MAGNESIUM 1.6 1.3*       Recent Labs  Lab 04/19/15  0700 04/18/15  0244   ALBUMIN 3.0* 3.2*   PROTEIN, TOTAL 6.1 6.3   BILIRUBIN, TOTAL 1.1 1.1   ALKALINE PHOSPHATASE 138 139   ALT 49 68*   AST (SGOT) 41 61*      Recent Labs  Lab 04/18/15  1401   SPECIFIC GRAVITY, UR 1.014   PH, URINE 7.0   PROTEIN, UR Negative   GLUCOSE, UA Negative   KETONES UA Negative   BILIRUBIN, UA Negative   BLOOD, UA Negative   NITRITE, UA Negative   UROBILINOGEN, UA Normal   LEUKOCYTE ESTERASE, UA Negative      Lab Results   Component Value Date    HGBA1CPERCNT 5.3 04/19/2012          Imaging, reviewed and are significant for:  CT Abdomen Pelvis with IV Cont   Final Result   1.  There are peripancreatic inflammatory changes compatible with acute pancreatitis. No portal vein thrombosis or   pancreatic necrosis is identified. Dilatation of the pancreatic duct and common bile duct is unchanged from prior study.   2.  Liver is  diffusely decreased in attenuation compatible with steatosis and contains an approximately 15 mm   hypodensity in the inferior aspect of the right lobe. This is incompletely evaluated on the current study and further   evaluation with elective hepatic mass protocol CT or MRI is recommended after the resolution of acute illness.   3.  There is a ventral hernia just left of the umbilicus which measures approximately 7 cm in greatest dimension and   contains a short segment of nonobstructed small bowel. There are also small fat-containing umbilical and bilateral   inguinal hernias without evidence of incarceration.   4.  Additional incidental findings as detailed above.            ReadingStation:[id number]          Microbiology Results     None            Donnie Coffin, DO  [facility]  [address]  [place], [place]  [facility] [id number]  Pager [phone]

## 2015-04-20 NOTE — Plan of Care (Signed)
Problem: Safe medical management of withdrawal from (list substances of abuse) AS EVIDENCED BY...  Goal: Safe detoxification/education about relapse prevention/engagement in discharge planning  Outcome: Progressing

## 2015-04-20 NOTE — Progress Notes (Signed)
04/20/15 1158   Case Management Quick Doc   Case Management Assessment Status Assessment Complete   CM Comments 04/20/15-RNCM-Patient admitted with acute pancreatitis. Fluid overload vs COPD exacerbation. Pain control, advance diet. Lasix, O2, [place] IVF. 10 L oxymizer. Patient plans return home with friends support. Denies HH needs at this time. Will continue to follow pending medical course.   Expected Discharge Date 04/22/15   Lorraine Lax, RN, MSN  Case Manager  [facility]   [phone]

## 2015-04-20 NOTE — Progress Notes (Signed)
Assumed care of pt at 0700. Pt asleep in bed. No s/s of pain or distress. Call bell in reach. Will continue to monitor.

## 2015-04-21 ENCOUNTER — Inpatient Hospital Stay: Payer: Commercial Managed Care - POS

## 2015-04-21 MED ORDER — FUROSEMIDE 10 MG/ML IJ SOLN
60.0000 mg | Freq: Two times a day (BID) | INTRAMUSCULAR | Status: DC
Start: 2015-04-22 — End: 2015-04-22
  Filled 2015-04-21 (×2): qty 6

## 2015-04-21 MED ORDER — FUROSEMIDE 10 MG/ML IJ SOLN
40.0000 mg | Freq: Once | INTRAMUSCULAR | Status: AC
Start: 2015-04-21 — End: 2015-04-21
  Administered 2015-04-21: 40 mg via INTRAVENOUS
  Filled 2015-04-21: qty 4

## 2015-04-21 MED ORDER — IBUPROFEN 400 MG PO TABS
400.0000 mg | ORAL_TABLET | Freq: Four times a day (QID) | ORAL | Status: DC | PRN
Start: 2015-04-21 — End: 2015-04-21

## 2015-04-21 MED ORDER — METHYLPREDNISOLONE SODIUM SUCC 40 MG IJ SOLR
40.0000 mg | Freq: Four times a day (QID) | INTRAMUSCULAR | Status: DC
Start: 2015-04-21 — End: 2015-04-22
  Administered 2015-04-21 – 2015-04-22 (×4): 40 mg via INTRAVENOUS
  Filled 2015-04-21 (×8): qty 1

## 2015-04-21 MED ORDER — METHYLPREDNISOLONE SODIUM SUCC 125 MG IJ SOLR
125.0000 mg | Freq: Once | INTRAMUSCULAR | Status: AC
Start: 2015-04-21 — End: 2015-04-21
  Administered 2015-04-21: 125 mg via INTRAVENOUS
  Filled 2015-04-21: qty 2

## 2015-04-21 MED ORDER — IBUPROFEN 600 MG PO TABS
600.0000 mg | ORAL_TABLET | Freq: Four times a day (QID) | ORAL | Status: DC | PRN
Start: 2015-04-21 — End: 2015-04-22
  Administered 2015-04-21: 600 mg via ORAL
  Filled 2015-04-21: qty 1

## 2015-04-21 MED ORDER — AZITHROMYCIN 250 MG PO TABS
250.0000 mg | ORAL_TABLET | Freq: Every day | ORAL | Status: DC
Start: 2015-04-21 — End: 2015-04-22
  Administered 2015-04-21 – 2015-04-22 (×2): 250 mg via ORAL
  Filled 2015-04-21 (×2): qty 1

## 2015-04-21 NOTE — Progress Notes (Signed)
04/21/15 1251   Case Management Quick Doc   Case Management Assessment Status Assessment Complete   CM Comments 04/21/15-RNCM-Patient admitted with acute pancreatitis. Fluid overload vs COPD exacerbation. Pain control, advance diet. Lasix, O2, 8 L oxymizer. Improved with lasix. Patient plans to return home with friends support. Denies HH needs at this time. Will continue to follow pending medical course.    Expected Discharge Date 04/24/15   Lorraine Lax, RN, MSN  Case Manager  [facility]   [phone]

## 2015-04-21 NOTE — Plan of Care (Signed)
Problem: Safety  Goal: Patient will be free from injury during hospitalization  Outcome: Progressing    Problem: Pain  Goal: Patient's pain/discomfort is manageable  Outcome: Progressing    Problem: Altered GI Function  Goal: Fluid and electrolyte balance are achieved/maintained  Outcome: Progressing

## 2015-04-21 NOTE — Progress Notes (Signed)
Temp rechecked.. Down to 100.4

## 2015-04-21 NOTE — Progress Notes (Signed)
Awake sitting up in bed receiving nebulizer treatment. Continuous pulse ox in place. o2 sat 94% on 5 liter nasal cannula in placeno acute distress observed. Denies pain .

## 2015-04-21 NOTE — Progress Notes (Signed)
Assumed care of pt at 0700. Pt sitting in bed watching TV. No complaints of pain. No s/s of distress. Call bell in reach. Will continue to monitor.

## 2015-04-21 NOTE — Plan of Care (Signed)
Problem: Pain  Goal: Patient's pain/discomfort is manageable  Outcome: Progressing

## 2015-04-21 NOTE — Progress Notes (Signed)
Pt rectal temp taken 101.5

## 2015-04-21 NOTE — Progress Notes (Signed)
Pt has a temp of 99.7 Pt feels cold. Dr. Jean Rosenthal called for Tylenol or Motrin order. Waiting for response

## 2015-04-21 NOTE — Progress Notes (Signed)
PROGRESS [facility]    Date Time: 04/21/2015 2:05 PM  Patient Name: [name]  Attending Physician: Herb Grays  MRN: [id number]       DOB: 03-03-1961      Assessment and Plan:                                                                                                 [facility]        Active Problems:    Acute pancreatitis    Hypoxia with fever concern for pneumonia versus COPD exacerbation  Continue Lasix due to fluid overload  Start on azithromycin  patient is on Oxymizer but improved from 15-8 L now  Continue nebulizer treatments   RT is involved.  X-ray now    Acute Pancreatitis significantly resolved  Stop IV fluids  CT shows acute pancreatitis  Pain control--change Dilaudid to Norco  Lipase normalized    Nausea vomiting and some diarrhea-resolved  Antiemetics as needed    Low magnesium resolved  Continue on mag ox  Replace IV 1.4    Elevated LFTs resolved  Will monitor closely  Liver is diffusely decreased in attenuation compatible with steatosis and contains an approximately 15 mm  hypodensity in the inferior aspect of the right lobe. This is incompletely evaluated on the current study and further evaluation with elective hepatic mass protocol CT or MRI is recommended after the resolution of acute illness.    Hypertension  Continue home medications with holding parameters    History of chronically low potassium  Replete potassium as needed  check an a.m.    Tobacco use  Smoking cessation counseling given  Continue on nicotine patches    ETOH abuse  CIWA stopped due to consistent 3 or less.       Needs a work note and FMLA signed    Nutrition: Regular diet  GI Prophylaxis: not indicated  DVT Prophylaxis: low molecular weight heparin and SCD's while in bed  Code Status: do not resuscitate     Time spent with patient 40  Nursing orders have been changed and reviewed  Labs and other studies were personally reviewed  MEDICATION changes were made and/or  added  Case was discussed with RN    I have spent 40 with the patient discussing the principle problem (listed above), the active hospital problems (listed above) and discharge planning issues. Greater than 50% of the time was spent counseling the patient and coordinating care with Nursing staff (RN/LPN).                     Subjective  [facility]     CC: abdominal pain    Patient denies nausea or vomiting. She denies chest pain or shortness of breath. She states absolutely wants to go home right now.    Review of Systems:    Constitutional:  Denies fever or fatigue.    ENT:  Denies sore throat or trouble swallowing.    CV:  Denies chest pain or palpitations.    Respiratory:  Denies SOB or cough.    GI:  See above    GU:  Denies pain or burning with urination.    MS:  Denies new muscle or joint pain.    Skin:  Denies rash.    Heme: Denies any bleeding.    Physical Exam:                                                                              [facility]     Patient Vitals for the past 24 hrs:   BP Temp Temp src Pulse Resp SpO2 Weight   04/21/15 1357 - - - - - 95 % -   04/21/15 1211 - (!) 101.5 F (38.6 C) Rectal - - - -   04/21/15 1141 - 99.7 F (37.6 C) Oral - - - -   04/21/15 0722 131/81 mmHg 98.6 F (37 C) Oral 72 20 100 % -   04/21/15 0615 - - - - - - 88.27 kg (194 lb 9.6 oz)   04/20/15 2341 (!) 131/91 mmHg 98 F (36.7 C) Oral 84 19 95 % -   04/20/15 1557 125/64 mmHg 98.1 F (36.7 C) Oral 83 18 94 % -        Intake/Output Summary (Last 24 hours) at 04/21/15 1405  Last data filed at 04/21/15 0511   Gross per 24 hour   Intake   1950 ml   Output   1700 ml   Net    250 ml     Wt Readings from Last 1 Encounters:   04/21/15 88.27 kg (194 lb 9.6 oz)        Constitutional: Well groomed and in no acute distress.    Psych: Alert and oriented X3. Affect is not depressed/anxious.    Eyes: Conjunctiva is pink without  purulent drainage.    ENT: Hearing is grossly normal. Mucosa is moist.    CV: Heart shows regular rate and rhythm. Pedal pulses are 2+. Pedal edema absent.    Lungs: Crackles in the lower bases bilaterally. No accessory muscle use.    Abd: No epigastric pain; normoactive bowel sounds..    MS: She is able to ambulate.     Skin: No rash. No nodules.      Meds:  [facility] Hospitalists     Estimated Creatinine Clearance: 106.9 mL/min (based on Cr of 0.66).    Current Facility-Administered Medications   Medication Dose Route Frequency   . albuterol-ipratropium  3 mL Nebulization Q6H SCH   . azithromycin  250 mg Oral Daily   . enoxaparin  40 mg Subcutaneous Q24H   . folic acid  1 mg Oral Daily   . furosemide  40 mg Intravenous BID   . labetalol  200 mg Oral BID   . methylPREDNISolone  40 mg Intravenous 4 times per day   . multivitamin  1 tablet Oral QAM   . nicotine  1 patch Transdermal Daily   . pantoprazole  40 mg Oral Daily   . potassium chloride  20 mEq Oral BID   . vitamin B-1  100 mg Oral Daily       PRN medications: albuterol, HYDROcodone-acetaminophen, ibuprofen, LORazepam, ondansetron **OR** ondansetron, promethazine **OR** promethazine **OR** promethazine    IV Drips:         Labs and Imaging:                                                                        [facility] Hospitalists     RECENT LABS (from the last 7 days)    Recent Labs  Lab 04/20/15  0618 04/19/15  0700   WBC 8.7 10.3   HEMOGLOBIN 12.5 13.7   HEMATOCRIT 37.2 39.2   PLT CT 197 223           Recent Labs  Lab 04/17/15  2241 04/17/15  1927   TROPONIN I <0.01 0.01          Recent Labs  Lab 04/20/15  0618 04/19/15  0700   GLUCOSE 90 83   SODIUM 137 139   POTASSIUM 4.1 4.3   CHLORIDE 103 108   CO2 27.4 22.3   BUN 6* 7   CREATININE 0.66 0.76   EGFR >60 >60   CALCIUM 8.6 8.7       Recent Labs  Lab 04/20/15  0618 04/19/15  0700   MAGNESIUM 1.6 1.3*       Recent  Labs  Lab 04/19/15  0700 04/18/15  0244   ALBUMIN 3.0* 3.2*   PROTEIN, TOTAL 6.1 6.3   BILIRUBIN, TOTAL 1.1 1.1   ALKALINE PHOSPHATASE 138 139   ALT 49 68*   AST (SGOT) 41 61*      Recent Labs  Lab 04/18/15  1401   SPECIFIC GRAVITY, UR 1.014   PH, URINE 7.0   PROTEIN, UR Negative   GLUCOSE, UA Negative   KETONES UA Negative   BILIRUBIN, UA Negative   BLOOD, UA Negative   NITRITE, UA Negative   UROBILINOGEN, UA Normal   LEUKOCYTE ESTERASE, UA Negative      Lab Results   Component Value Date    HGBA1CPERCNT 5.3 04/19/2012          Imaging, reviewed and are significant for:  CT Abdomen Pelvis with IV Cont   Final Result   1.  There are peripancreatic inflammatory changes compatible with acute pancreatitis. No portal vein thrombosis or   pancreatic necrosis is identified. Dilatation of the pancreatic duct and common bile duct  is unchanged from prior study.   2.  Liver is diffusely decreased in attenuation compatible with steatosis and contains an approximately 15 mm   hypodensity in the inferior aspect of the right lobe. This is incompletely evaluated on the current study and further   evaluation with elective hepatic mass protocol CT or MRI is recommended after the resolution of acute illness.   3.  There is a ventral hernia just left of the umbilicus which measures approximately 7 cm in greatest dimension and   contains a short segment of nonobstructed small bowel. There are also small fat-containing umbilical and bilateral   inguinal hernias without evidence of incarceration.   4.  Additional incidental findings as detailed above.            ReadingStation:[id number]      XR CHEST 2 VIEWS    (Results Pending)       Microbiology Results     None            Donnie Coffin, DO  [facility]  [address]  [place], [place]  [facility] [address]  Pager [phone]

## 2015-04-22 ENCOUNTER — Inpatient Hospital Stay: Payer: Commercial Managed Care - POS

## 2015-04-22 LAB — CBC AND DIFFERENTIAL
Basophils %: 0.1 % (ref 0.0–3.0)
Basophils Absolute: 0 10*3/uL (ref 0.0–0.3)
Eosinophils %: 0.3 % (ref 0.0–7.0)
Eosinophils Absolute: 0 10*3/uL (ref 0.0–0.8)
Hematocrit: 41.6 % (ref 36.0–48.0)
Hemoglobin: 14.5 gm/dL (ref 12.0–16.0)
Lymphocytes Absolute: 0.9 10*3/uL (ref 0.6–5.1)
Lymphocytes: 8.4 % — ABNORMAL LOW (ref 15.0–46.0)
MCH: 39 pg — ABNORMAL HIGH (ref 28–35)
MCHC: 35 gm/dL (ref 32–36)
MCV: 111 fL — ABNORMAL HIGH (ref 80–100)
MPV: 7.6 fL (ref 6.0–10.0)
Monocytes Absolute: 0.7 10*3/uL (ref 0.1–1.7)
Monocytes: 6.6 % (ref 3.0–15.0)
Neutrophils %: 84.7 % — ABNORMAL HIGH (ref 42.0–78.0)
Neutrophils Absolute: 9.2 10*3/uL — ABNORMAL HIGH (ref 1.7–8.6)
PLT CT: 322 10*3/uL (ref 130–440)
RBC: 3.75 10*6/uL — ABNORMAL LOW (ref 3.80–5.00)
RDW: 13 % (ref 11.0–14.0)
WBC: 10.8 10*3/uL (ref 4.0–11.0)

## 2015-04-22 LAB — COMPREHENSIVE METABOLIC PANEL
ALT: 29 U/L (ref 0–55)
AST (SGOT): 21 U/L (ref 10–42)
Albumin/Globulin Ratio: 0.7 Ratio (ref 0.70–1.50)
Albumin: 3.1 gm/dL — ABNORMAL LOW (ref 3.5–5.0)
Alkaline Phosphatase: 149 U/L — ABNORMAL HIGH (ref 40–145)
Anion Gap: 17.4 mMol/L (ref 7.0–18.0)
BUN / Creatinine Ratio: 17.8 Ratio (ref 10.0–30.0)
BUN: 19 mg/dL (ref 7–22)
Bilirubin, Total: 0.6 mg/dL (ref 0.1–1.2)
CO2: 25.2 mMol/L (ref 20.0–30.0)
Calcium: 10 mg/dL (ref 8.5–10.5)
Chloride: 96 mMol/L — ABNORMAL LOW (ref 98–110)
Creatinine: 1.07 mg/dL (ref 0.60–1.20)
EGFR: 53 mL/min/{1.73_m2}
Globulin: 4.4 gm/dL — ABNORMAL HIGH (ref 2.0–4.0)
Glucose: 220 mg/dL — ABNORMAL HIGH (ref 70–99)
Osmolality Calc: 279 mOsm/kg (ref 275–300)
Potassium: 3.6 mMol/L (ref 3.5–5.3)
Protein, Total: 7.5 gm/dL (ref 6.0–8.3)
Sodium: 135 mMol/L — ABNORMAL LOW (ref 136–147)

## 2015-04-22 MED ORDER — PREDNISONE 1 MG PO TABS
ORAL_TABLET | ORAL | Status: DC
Start: 2015-04-22 — End: 2015-11-23

## 2015-04-22 MED ORDER — AZITHROMYCIN 250 MG PO TABS
250.0000 mg | ORAL_TABLET | Freq: Every day | ORAL | Status: AC
Start: 2015-04-22 — End: 2015-04-26

## 2015-04-22 NOTE — Discharge Summary (Signed)
DISCHARGE SUMMARY - [facility]    Patient Name: [name]  Attending Physician: Donnie Coffin, DO  Primary Care Physician: Cain Saupe, MD    Date of Admission: 04/17/2015  Date of Discharge: 04/22/2015  Length of Stay in the Hospital: 5    Discharge Diagnoses:                                                                      [facility]     COPD exacerbation with acute bronchitis improving  Acute pancreatitis resolved    Discharge Condition: stable    Discharge Instructions:                                                                   [facility]      Disposition:  home    Diet: Regular Diet    Activity: As tolerated and May resume normal activities    Discharge Code Status: NO CPR - SUPPORT OK    Patient was instructed to follow up with:   Cain Saupe, MD  [address]  [place] [address]  [phone]    Schedule an appointment as soon as possible for a visit in 2 weeks  Hospital FU      Complete instructions are in the patient's After Visit Summary (AVS).    Discharge Medications:                                                                    [facility]        Medication List      START taking these medications          azithromycin 250 MG tablet   Commonly known as:  ZITHROMAX   Take 1 tablet (250 mg total) by mouth daily.       predniSONE 1 MG tablet   Commonly known as:  DELTASONE   Take 4 tablets for 3 days, take 3 tablets for 3 days, take 2 tablets for 3 days, take 1 tablet for 3 days. Total of 30 tablets         CONTINUE taking these medications          amLODIPine 10 MG tablet   Commonly known as:  NORVASC       ibuprofen 200 MG tablet   Commonly known as:  ADVIL,MOTRIN       labetalol 100 MG tablet   Commonly known as:  NORMODYNE       multivitamin with minerals tablet       pantoprazole 40 MG tablet   Commonly known as:  PROTONIX       potassium chloride 20 MEQ tablet   Commonly known as:  K-DUR,KLOR-CON   Take 1  tablet (20 mEq total) by mouth 2 (two) times daily.       vitamin B-1 100 MG tablet   Commonly known as:  THIAMINE   Take 1 tablet (100 mg total) by mouth daily.            Where to Get Your Medications      These medications were sent to [facility] [address] ([place]), [place] - [address]  [address], [place]) [place] [address]     Phone:  [phone]    - azithromycin 250 MG tablet  - predniSONE 1 MG tablet          Admission H&P summary:                                                                 [name] is a 55 y.o. female who presents to the hospital with abdominal pain that started last night. She did recently drink last night but states she only drinks intermittently. She has had some n/v/d this morning too but no fevers.   She has had about 10 episodes of vomiting in the emergency room but states she does not want an NG tube at least right now. She denies any fevers, chills. She denies any chest pain or shortness of breath.  She states the abdominal pain started yesterday it goes in her mid section and radiate to her back. It does not radiate to her lower abdomen. She states she also has a hernia but she is concerned about.  (See full History and Physical for details.)    Consultations:                                                                                    [facility]     Treatment Team: Attending Provider: Donnie Coffin, DO; Consulting Physician: Donnie Coffin, DO; Case Manager: Marquette Saa, RN; Registered Nurse: Malvin Johns, RN; Technician: Heironimus, Rueben Bash, CNA; Respiratory Care Practitioner: Griffin Basil, RT    Procedures/Radiology performed:                                                 [facility]     CT ABDOMEN PELVIS W IV/ WO PO CONT  XR CHEST 2 VIEWS  ECHOCARDIOGRAM ADULT COMPLETE W CLR/ DOPP WAVEFORM      No orders of the defined types were placed in this encounter.        Allergies:  [facility] Course:                                                                                [facility]     55 year old female who presented to the hospital with acute pancreatitis. This was documented on CT scan and elevated lipase. Started IV fluids with pain control and within a days she had significantly improved. She tolerated a regular diet upon discharge w/o Nausea or vomiting. Her pain is completely resolved on discharge.     Unfortunately with the increased amount of fluids given to treat pancreatitis she had an edema which I treated with Lasix. This improved. In addition she started having hypoxia requiring an Oxymizer to 15 L.  This improved with Lasix but she also spiked a fever later that day therefore start steroids and antibiotics. I feel she had acute COPD exacerbation with acute bronchitis .  She improved quickly after the addition of steroids and antibiotics. She is currently on room air. I will discharge her with antibiotics and steroids.    Discharge Day Exam:  Temp:  [97.5 F (36.4 C)-101.5 F (38.6 C)] 97.8 F (36.6 C)  Heart Rate:  [74-92] 92  Resp Rate:  [16-18] 18  BP: (117-174)/(49-81) 174/76 mmHg  Wt Readings from Last 3 Encounters:   04/22/15 81.6 kg (179 lb 14.3 oz)   01/01/15 85.004 kg (187 lb 6.4 oz)   12/28/14 82.555 kg (182 lb)       General: awake, alert, oriented x 3; no acute distress.  Cardiovascular: regular rate and rhythm, no murmurs.  Lungs: clear to auscultation bilaterally, without wheezing, rhonchi, or rales  Abdomen: soft, non-tender, non-distended; no palpable masses, no hepatosplenomegaly, normoactive bowel sounds, no rebound or guarding  Extremities: no clubbing, cyanosis, or edema        Discharge Condition:                                                                        [facility]     The patient was discharged in  stable condition.  Time spent coordinating discharge and reviewing discharge plan:  39 minutes      Signed by: Donnie Coffin, DO    [facility]  [address]  [place], [place] [address]  Pager [phone]  CC: Cain Saupe, MD

## 2015-04-22 NOTE — Progress Notes (Signed)
Pt requested around 0200 to be take off oxygen, to see how she does without it since does not wear at home, sats were 92% on room air, after 1 hour of being off oxygen sat 87-89% no distress noted, placed pt on o2 2 liter nasal cannula, o2 sat 92% on 2 liters.

## 2015-04-22 NOTE — Progress Notes (Signed)
04/22/15 1155   Case Management Quick Doc   Case Management Assessment Status Assessment Complete   CM Comments 04/22/15-RNCM-Patient admitted with acute pancreatitis. Developed COPD exacerbation. Pain control, advancing diet, Lasix, O2-RA now. Patient plans to return home with friends support. [facility] needs at this time. For discharge today.   Expected Discharge Date 04/22/15   Lorraine Lax, RN, MSN  Case Manager  [facility]   [phone]

## 2015-04-22 NOTE — Plan of Care (Signed)
o2 sat 92-94 % on o2 2 liter nasal cannula. Denies abdominal pain or nausea.

## 2015-04-24 LAB — ECG 12-LEAD
P Wave Axis: 58 deg
P-R Interval: 178 ms
Patient Age: 54 years
Q-T Interval(Corrected): 495 ms
Q-T Interval: 483 ms
QRS Axis: 32 deg
QRS Duration: 100 ms
T Axis: 44 years
Ventricular Rate: 63 //min

## 2015-10-21 ENCOUNTER — Emergency Department: Payer: Commercial Managed Care - POS

## 2015-10-21 ENCOUNTER — Emergency Department
Admission: EM | Admit: 2015-10-21 | Discharge: 2015-10-21 | Disposition: A | Discharge status: Home / Self Care | Payer: Commercial Managed Care - POS | Attending: Emergency Medicine | Admitting: Emergency Medicine

## 2015-10-21 DIAGNOSIS — K852 Alcohol induced acute pancreatitis without necrosis or infection: Secondary | ICD-10-CM | POA: Insufficient documentation

## 2015-10-21 DIAGNOSIS — W1839XA Other fall on same level, initial encounter: Secondary | ICD-10-CM | POA: Insufficient documentation

## 2015-10-21 DIAGNOSIS — S2232XA Fracture of one rib, left side, initial encounter for closed fracture: Secondary | ICD-10-CM | POA: Insufficient documentation

## 2015-10-21 LAB — CBC AND DIFFERENTIAL
Basophils Absolute: 0 10*3/uL (ref 0.0–0.3)
Eosinophils Absolute: 0 10*3/uL (ref 0.0–0.8)
Hematocrit: 43 % (ref 36.0–48.0)
Hemoglobin: 14.7 gm/dL (ref 12.0–16.0)
Lymphocytes Absolute: 1.6 10*3/uL (ref 0.6–5.1)
Lymphocytes: 14 % — ABNORMAL LOW (ref 15.0–46.0)
MCH: 38 pg — ABNORMAL HIGH (ref 28–35)
MCHC: 34 gm/dL (ref 32–36)
MCV: 112 fL — ABNORMAL HIGH (ref 80–100)
MPV: 6.5 fL (ref 6.0–10.0)
Monocytes Absolute: 0.9 10*3/uL (ref 0.1–1.7)
Monocytes: 8 % (ref 3.0–15.0)
Neutrophils %: 78 % (ref 42.0–78.0)
Neutrophils Absolute: 9.1 10*3/uL — ABNORMAL HIGH (ref 1.7–8.6)
PLT CT: 302 10*3/uL (ref 130–440)
RBC: 3.82 10*6/uL (ref 3.80–5.00)
RDW: 13.3 % (ref 11.0–14.0)
WBC: 11.7 10*3/uL — ABNORMAL HIGH (ref 4.0–11.0)

## 2015-10-21 LAB — I-STAT CHEM 8 CARTRIDGE
Anion Gap I-Stat: 20 — ABNORMAL HIGH (ref 7–16)
BUN I-Stat: <3 mg/dL — ABNORMAL LOW (ref 6–20)
Calcium Ionized I-Stat: 4.6 mg/dL (ref 4.35–5.10)
Chloride I-Stat: 100 mMol/L (ref 98–112)
Creatinine I-Stat: 0.9 mg/dL (ref 0.60–1.10)
EGFR: 72 mL/min/{1.73_m2} (ref 60–150)
Glucose I-Stat: 97 mg/dL (ref 70–99)
Hematocrit I-Stat: 45 % (ref 36.0–48.0)
Hemoglobin I-Stat: 15.3 gm/dL (ref 12.0–16.0)
Potassium I-Stat: 3.1 mMol/L — ABNORMAL LOW (ref 3.5–5.3)
Sodium I-Stat: 136 mMol/L (ref 135–145)
TCO2 I-Stat: 20 mMol/L — ABNORMAL LOW (ref 24–29)

## 2015-10-21 LAB — HEPATIC FUNCTION PANEL
ALT: 44 U/L (ref 0–55)
AST (SGOT): 58 U/L — ABNORMAL HIGH (ref 10–42)
Albumin/Globulin Ratio: 1.15 Ratio (ref 0.70–1.50)
Albumin: 3.8 gm/dL (ref 3.5–5.0)
Alkaline Phosphatase: 148 U/L — ABNORMAL HIGH (ref 40–145)
Bilirubin Direct: 0.3 mg/dL (ref 0.0–0.3)
Bilirubin, Total: 0.8 mg/dL (ref 0.1–1.2)
Globulin: 3.3 gm/dL (ref 2.0–4.0)
Protein, Total: 7.1 gm/dL (ref 6.0–8.3)

## 2015-10-21 LAB — VH I-STAT CHEM 8 NOTIFICATION

## 2015-10-21 LAB — LIPASE: Lipase: 1144 U/L — ABNORMAL HIGH (ref 8–78)

## 2015-10-21 MED ORDER — KETOROLAC TROMETHAMINE 30 MG/ML IJ SOLN
INTRAMUSCULAR | Status: AC
Start: 2015-10-21 — End: ?
  Filled 2015-10-21: qty 1

## 2015-10-21 MED ORDER — KETOROLAC TROMETHAMINE 30 MG/ML IJ SOLN
30.0000 mg | Freq: Once | INTRAMUSCULAR | Status: AC
Start: 2015-10-21 — End: 2015-10-21
  Administered 2015-10-21: 30 mg via INTRAVENOUS

## 2015-10-21 MED ORDER — TRAMADOL HCL 50 MG PO TABS
50.0000 mg | ORAL_TABLET | Freq: Three times a day (TID) | ORAL | Status: DC | PRN
Start: 2015-10-21 — End: 2017-07-26

## 2015-10-21 NOTE — Discharge Instructions (Signed)
Rib Fracture (Broken Rib)  Your ribs are curved bones in your chest. They help protect your lungs and expand and contract when you breathe. Children's ribs bend easily and can often withstand a blow or fall. But adult ribs are more likely to break (fracture) under stress. Even coughing or a hard sneeze can fracture a rib.    When to go to the Emergency Room (ER)  Although they can be painful, most rib fractures aren't serious. But they often make it hard to cough or breathe deeply. Get medical care right away if you have:   Trouble breathing.   Nausea, vomiting, or stomach pain with a sore or bruised rib.   Pain that worsens over time.   An injury to the chest or stomach.  What to expect in the ER  Here is what will happen in the ER:   A healthcare provider will ask about your injury and examine you carefully.   An X-ray of your chest will likely be taken to show any major damage to ribs and lungs. However, ribs can undergo small breaks that do not show up on X-rays, even though they still hurt.   You may be givenmedicine to ease your discomfort.   Rarely, rib fractures can cause a lung to collapse or lead to bleeding in the chest. In these cases, a tube will be inserted into the chest to reinflate the lung or drain the blood.  Follow-up  You are likely to heal in 6 to 8 weeks. Most rib fractures heal on their own with no lasting effects. Call yourhealthcare providerright away if you notice any of these symptoms:   Increased chest pain   Shortness of breath   Fever   Coughing up blood  Date Last Reviewed: 01/03/2014   2000-2016 [employer] [address] [place], [place] [address]. All rights reserved. This information is not intended as a substitute for professional medical care. Always follow your healthcare professional's instructions.          Discharge Instructions for Acute Pancreatitis  You have been diagnosed with acute pancreatitis. Your pancreas is inflamed or swollen. The  pancreas is an organ that makes digestive juices and hormones. Gallstones are a common cause of pancreatitis. These hard stones form in the gallbladder. The gallbladder shares a tube with the pancreas into the small intestine. If gallstones block this tube, fluid can't leave the pancreas. The fluid backs up and causes redness and swelling (inflammation).There are other causes of pancreatitis. Make sure you understand the cause of your pancreatitis. Then you can try to stop it from happening again.  Immediate home care   Find someone to drive you to appointments. Acute pancreatitis is a serious condition, and you should never drive if you are experiencing symptoms.   Stop drinking if your illness was caused by alcohol.   Ask your healthcare provider about alcohol abuse programs and support groups such as Alcoholics Anonymous.   Ask your provider about prescription medicines that can help you stop drinking.   Tell your provider about the alcohol withdrawal symptoms you have when you stop drinking. This is very important. You may need close medical supervision and special medicineswhen you stop drinking. This will depend on your alcohol withdrawal history.   Take your medicines exactly as directed. Don't skip doses.   Eat a low-fat diet. Ask your provider for menus and other diet information.   Learn to take your own pulse. Keep a record of your  results. Ask your provider which readings mean that you need medical attention.  Ongoingcare   Tell your provider about any medicines you are taking. Some medicines can cause this condition.   Before starting any new medicine, ask your provider if it will harm your pancreas. This includes any new over-the-counter medicines, vitamins, or herbal supplements.    Tell your provider if you lose weight without dieting.   Be aware of symptoms that may mean your pancreatitis has come back. These symptoms include belly pain, nausea and vomiting, and fever.   Keep all  follow-up appointments with your provider. Problems can often show up later.  Follow-up  Follow up with your healthcare provider, or as advised.  When to call your provider  Call your healthcare provider right away if you have any of the following:   Feverof100.19F(38.0C) or higher, or as advised by your provider   Severe pain from your upper belly to your back   Nausea and vomiting   Feely dizzy or lightheaded   Yellowing of your skin or eyes (jaundice)   Bruises on your belly or back   Belly swelling and tenderness   Rapid pulse   Shallow, fast breathing   Date Last Reviewed: 11/09/2014   2000-2016 [employer] [address] [place], [place] [address]. All rights reserved. This information is not intended as a substitute for professional medical care. Always follow your healthcare professional's instructions.          Rib Fracture (Broken Rib)  Your ribs are curved bones in your chest. They help protect your lungs and expand and contract when you breathe. Children's ribs bend easily and can often withstand a blow or fall. But adult ribs are more likely to break (fracture) under stress. Even coughing or a hard sneeze can fracture a rib.    When to go to the Emergency Room (ER)  Although they can be painful, most rib fractures aren't serious. But they often make it hard to cough or breathe deeply. Get medical care right away if you have:   Trouble breathing.   Nausea, vomiting, or stomach pain with a sore or bruised rib.   Pain that worsens over time.   An injury to the chest or stomach.  What to expect in the ER  Here is what will happen in the ER:   A healthcare provider will ask about your injury and examine you carefully.   An X-ray of your chest will likely be taken to show any major damage to ribs and lungs. However, ribs can undergo small breaks that do not show up on X-rays, even though they still hurt.   You may be givenmedicine to ease your discomfort.   Rarely, rib  fractures can cause a lung to collapse or lead to bleeding in the chest. In these cases, a tube will be inserted into the chest to reinflate the lung or drain the blood.  Follow-up  You are likely to heal in 6 to 8 weeks. Most rib fractures heal on their own with no lasting effects. Call yourhealthcare providerright away if you notice any of these symptoms:   Increased chest pain   Shortness of breath   Fever   Coughing up blood  Date Last Reviewed: 01/03/2014   2000-2016 [employer] [address], [place], [place] [address]. All rights reserved. This information is not intended as a substitute for professional medical care. Always follow your healthcare professional's instructions.

## 2015-10-30 NOTE — ED Provider Notes (Signed)
Physician/Midlevel provider first contact with patient: 10/21/15 1420              EMERGENCY DEPARTMENT  PHYSICIAN NOTE    Patient Name: [name], [name]  Encounter Date:  10/21/2015  PCP: Cain Saupe, MD  Patient DOB:  1961/04/02  MRN:  [id number]  Room:  E52/E52-A  ED Physician: Harless Nakayama. Sherryll Burger, MD    DIAGNOSIS / DISPOSITION     Clinical Impression  1. Closed fracture of rib of left side, initial encounter    2. Alcohol-induced acute pancreatitis, unspecified complication status        Disposition  ED Disposition     Discharge [name] discharge to home/self care.    Condition at disposition: Stable           Prescriptions  Discharge Medication List as of 10/21/2015  4:24 PM      START taking these medications    Details   traMADol (ULTRAM) 50 MG tablet Take 1-2 tablets (50-100 mg total) by mouth every 8 (eight) hours as needed., Starting 10/21/2015, Until Discontinued, Print             HISTORY OF PRESENTING ILLNESS     Chief complaint: Abdominal Pain    HPI/ROS is limited by: none  HPI/ROS given by: patient      Magdelene Ruark is a 55 y.o. female who presents with a one-day history of left sided chest and abdominal pain. The patient states the pain is sharp. She does not know of any alleviating factors. She does not know if there is been any injury. She admits to drinking alcohol regularly. She states she has been coughing. She is unsure if she has fallen. She has a history of recurrent pancreatitis. She has nausea, vomiting or diarrhea. He left-sided chest pain started this morning. The patient states her dog did jump onto her bed this morning and landed on her. He states she's having a hard time taking a deep breath. The pain is described as sharp. She does not know of any relieving factors    REVIEW OF SYSTEMS   Review of Systems   Constitutional: Negative for fever and chills.   HENT: Negative for congestion and sore throat.    Eyes: Negative for blurred vision.   Respiratory:  Negative for cough and shortness of breath.    Cardiovascular: Positive for chest pain.   Gastrointestinal: Positive for abdominal pain. Negative for nausea, vomiting and diarrhea.   Genitourinary: Negative for dysuria.   Musculoskeletal: Negative for back pain.   Skin: Negative for rash.   Neurological: Negative for dizziness, weakness and headaches.   Psychiatric/Behavioral: Positive for substance abuse.     PHYSICAL EXAM   Blood pressure 132/74, pulse 70, temperature 98.2 F (36.8 C), temperature source Oral, resp. rate 20, height 1.676 m, weight 87 kg, SpO2 99 %.  The vital signs and the nurses note have been reviewed by me  Physical Exam   Constitutional: She is oriented to person, place, and time. She appears well-developed.   HENT:   Head: Normocephalic and atraumatic.   Eyes: EOM are normal. Pupils are equal, round, and reactive to light.   Neck: Normal range of motion.   Cardiovascular: Normal rate and regular rhythm.    Pulmonary/Chest: Effort normal. She exhibits tenderness.   Abdominal: Soft. There is tenderness. There is no guarding.       Musculoskeletal: Normal range of motion. She exhibits no edema.   Neurological: She is alert and  oriented to person, place, and time.   Skin: Skin is warm and dry.   Psychiatric: She has a normal mood and affect. Her behavior is normal. Judgment and thought content normal.   Nursing note and vitals reviewed.    ALLERGIES      Percocet    The patient's allergies were reviewed by the M.D.    MEDICATIONS     Current Outpatient Rx   Name  Route  Sig  Dispense  Refill   . amLODIPine (NORVASC) 10 MG tablet    Oral    Take 10 mg by mouth every morning.                Marland Kitchen ibuprofen (ADVIL,MOTRIN) 200 MG tablet    Oral    Take 600 mg by mouth every 6 (six) hours as needed for Pain.             Marland Kitchen labetalol (NORMODYNE) 100 MG tablet    Oral    Take 200 mg by mouth 2 (two) times daily. Take two 100 mg tablets for a total dose of 200 mg every 12 hours               . Multiple  Vitamins-Minerals (MULTIVITAMIN WITH MINERALS) tablet    Oral    Take 1 tablet by mouth every morning.                . pantoprazole (PROTONIX) 40 MG tablet    Oral    Take 40 mg by mouth every morning.                . potassium chloride (K-DUR,KLOR-CON) 20 MEQ tablet    Oral    Take 1 tablet (20 mEq total) by mouth 2 (two) times daily.    10 tablet    0     . predniSONE (DELTASONE) 1 MG tablet        Take 4 tablets for 3 days, take 3 tablets for 3 days, take 2 tablets for 3 days, take 1 tablet for 3 days. Total of 30 tablets    30 tablet    0     . traMADol (ULTRAM) 50 MG tablet    Oral    Take 1-2 tablets (50-100 mg total) by mouth every 8 (eight) hours as needed.    20 tablet    0     . vitamin B-1 (THIAMINE) 100 MG tablet    Oral    Take 1 tablet (100 mg total) by mouth daily.    30 tablet    0          The patient's medications were reviewed by the M.D.  PAST MEDICAL HISTORY     Past Medical History   Diagnosis Date   . Hypertension    . Asthma without status asthmaticus    . Gastroesophageal reflux disease    . Pancreatitis    . DVT (deep venous thrombosis)    . S/P IVC filter    . Clostridium difficile colitis    . H/O ETOH abuse      sober 2 years until 03/2014       The patient's past medical history was reviewed by the M.D.  PAST SURGICAL HISTORY     Past Surgical History   Procedure Laterality Date   . Colonoscopy  12/26/2012     Procedure: COLONOSCOPY;  Surgeon: Gwenith Spitz, MD;  Location: [facility];  Service: Gastroenterology;  Laterality:  N/A;   . Colonoscopy, polypectomy  12/26/2012     Procedure: COLONOSCOPY, POLYPECTOMY;  Surgeon: Gwenith Spitz, MD;  Location: [facility];  Service: Gastroenterology;  Laterality: N/A;   . Cholecystectomy     . Pancreatectomy  01/2011     partial       The patient's past surgical history was reviewed by the M.D.    FAMILY HISTORY     Family History   Problem Relation Age of Onset   . Adopted: Yes       SOCIAL HISTORY     Social History   Substance  Use Topics   . Smoking status: Current Every Day Smoker -- 0.25 packs/day for 30 years     Types: Cigarettes   . Smokeless tobacco: Never Used   . Alcohol Use: Yes      Comment: occasional       ORDERS PLACED AND MEDICATIONS GIVEN     Orders Placed This Encounter   Procedures   . XR RIBS LEFT 2 VW   . CBC   . I-Stat Chem 8   . Lipase   . LFTs   . i-Stat Chem 8 CartrIDge   . Saline lock IV       Medications   ketorolac (TORADOL) injection 30 mg (30 mg Intravenous Given 10/21/15 1645)       DIAGNOSTIC RESULTS       The results of the diagnostic studies below have been reviewed by myself:    Labs  Results     Procedure Component Value Units Date/Time    CBC [[id number]]  (Abnormal) Collected:  10/21/15 1445    Specimen Information:  Blood from Blood Updated:  10/21/15 1613     WBC 11.7 (H) K/cmm      RBC 3.82 M/cmm      Hemoglobin 14.7 gm/dL      Hematocrit 33.2 %      MCV 112 (H) fL      MCH 38 (H) pg      MCHC 34 gm/dL      RDW 95.1 %      PLT CT 302 K/cmm      MPV 6.5 fL      NEUTROPHIL % 78.0 %      Lymphocytes 14.0 (L) %      Monocytes 8.0 %      Neutrophils Absolute 9.1 (H) K/cmm      Lymphocytes Absolute 1.6 K/cmm      Monocytes Absolute 0.9 K/cmm      Eosinophils Absolute 0.0 K/cmm      BASO Absolute 0.0 K/cmm      RBC Morphology RBC Morphology Reviewed      Macrocytic 2+      Anisocytosis 1+      Polychromasia 1+      Target Cells 1+     Lipase [[id number]]  (Abnormal) Collected:  10/21/15 1445    Specimen Information:  Plasma Updated:  10/21/15 1549     Lipase 1144 (H) U/L     LFTs [[id number]]  (Abnormal) Collected:  10/21/15 1445    Specimen Information:  Plasma Updated:  10/21/15 1549     Protein, Total 7.1 gm/dL      Albumin 3.8 gm/dL      Alkaline Phosphatase 148 (H) U/L      ALT 44 U/L      AST (SGOT) 58 (H) U/L      Bilirubin, Total 0.8 mg/dL  Bilirubin, Direct 0.3 mg/dL      Albumin/Globulin Ratio 1.15 Ratio      Globulin 3.3 gm/dL     i-Stat Chem 8 CartrIDge [[id number]]  (Abnormal) Collected:   10/21/15 1520    Specimen Information:  Blood Updated:  10/21/15 1523     i-STAT Sodium 136 mMol/L      i-STAT Potassium 3.1 (L) mMol/L      i-STAT Chloride 100 mMol/L      TCO2, ISTAT 20 (L) mMol/L      Ionized Ca, ISTAT 4.60 mg/dL      i-STAT Glucose 97 mg/dL      i-STAT Creatinine 0.90 mg/dL      i-STAT BUN <3 (L) mg/dL      Anion Gap, ISTAT 95.1 (H)      EGFR 72 mL/min/1.20m2      i-STAT Hematocrit 45.0 %      i-STAT Hemoglobin 15.3 gm/dL     I-Stat Chem 8 [[id number]] Collected:  10/21/15 1445    Specimen Information:  ISTAT Updated:  10/21/15 1518     I-STAT Notification Istat Notification             Radiologic Studies  I have personally reviewed the images myself    IMPRESSION:   There is nondisplaced left 10th rib fracture, without contusion or pneumothorax.      MDM / ED COURSE     Blood pressure 132/74, pulse 70, temperature 98.2 F (36.8 C), temperature source Oral, resp. rate 20, height 1.676 m, weight 87 kg, SpO2 99 %.    I have personally reviewed the patient's past medical records.     The patient presents to the Emergency Department with abdominal pain. Treatment has been initiated in the ER, but the patient has not had significant improvement in symptoms, appears ill enough and/or has illness/findings/co-morbidities that make admission for IV medications, possible surgical consult, and further management the most appropriate disposition. Differential diagnosis has included but is not limited to appendicitis, gall bladder disease, bowel obstruction, colitis, gastroenteritis, urinary tract obstruction, AAA, pancreatitic and hepatitis.  Diagnostic impression and plan were discussed and agreed upon with the patient and/or family.  Results of lab/radiology tests were reviewed and discussed with the patient and/or family. All questions were answered and concerns addressed.  Appropriate consultation was made for admission and further treatment of this patient.    The patient's lipase returns as markedly  elevated. Her chest x-ray does reveal evidence of a left rib fracture    The patient's pain improved with Toradol in the emergency department    Offered the patient admission to the hospital for her pancreatitis. At this time the patient is refusing. She states that she has had pancreatitis before and this is not one of her bad flares. She will return for worsening of symptoms. She is requesting pain medication for her broken rib.    The patient was provided with analgesics for her broken rib and she'll return for worsening symptoms            Note:  This chart was generated by the Epic EMR system/ speech recognition and may contain inherent errors, including typographical, or omissions not intended by the user            Ermalene Postin, MD  10/30/15 [phone]

## 2015-11-23 ENCOUNTER — Inpatient Hospital Stay
Admission: EM | Admit: 2015-11-23 | Discharge: 2015-11-27 | DRG: 439 | Disposition: A | Discharge status: Home / Self Care | Payer: Commercial Managed Care - POS | Attending: Internal Medicine | Admitting: Internal Medicine

## 2015-11-23 ENCOUNTER — Emergency Department: Payer: Commercial Managed Care - POS

## 2015-11-23 ENCOUNTER — Inpatient Hospital Stay: Payer: Commercial Managed Care - POS | Admitting: Family Medicine

## 2015-11-23 DIAGNOSIS — J45909 Unspecified asthma, uncomplicated: Secondary | ICD-10-CM | POA: Diagnosis present

## 2015-11-23 DIAGNOSIS — N179 Acute kidney failure, unspecified: Secondary | ICD-10-CM | POA: Diagnosis present

## 2015-11-23 DIAGNOSIS — R109 Unspecified abdominal pain: Secondary | ICD-10-CM

## 2015-11-23 DIAGNOSIS — F1010 Alcohol abuse, uncomplicated: Secondary | ICD-10-CM | POA: Diagnosis present

## 2015-11-23 DIAGNOSIS — K860 Alcohol-induced chronic pancreatitis: Secondary | ICD-10-CM | POA: Diagnosis present

## 2015-11-23 DIAGNOSIS — I10 Essential (primary) hypertension: Secondary | ICD-10-CM | POA: Diagnosis present

## 2015-11-23 DIAGNOSIS — R112 Nausea with vomiting, unspecified: Secondary | ICD-10-CM

## 2015-11-23 DIAGNOSIS — K861 Other chronic pancreatitis: Secondary | ICD-10-CM

## 2015-11-23 DIAGNOSIS — K859 Acute pancreatitis without necrosis or infection, unspecified: Secondary | ICD-10-CM | POA: Diagnosis present

## 2015-11-23 DIAGNOSIS — Z90411 Acquired partial absence of pancreas: Secondary | ICD-10-CM

## 2015-11-23 DIAGNOSIS — E860 Dehydration: Secondary | ICD-10-CM | POA: Diagnosis present

## 2015-11-23 DIAGNOSIS — Z9049 Acquired absence of other specified parts of digestive tract: Secondary | ICD-10-CM

## 2015-11-23 DIAGNOSIS — K219 Gastro-esophageal reflux disease without esophagitis: Secondary | ICD-10-CM | POA: Diagnosis present

## 2015-11-23 DIAGNOSIS — Z86718 Personal history of other venous thrombosis and embolism: Secondary | ICD-10-CM

## 2015-11-23 DIAGNOSIS — K852 Alcohol induced acute pancreatitis without necrosis or infection: Principal | ICD-10-CM | POA: Diagnosis present

## 2015-11-23 DIAGNOSIS — F17210 Nicotine dependence, cigarettes, uncomplicated: Secondary | ICD-10-CM | POA: Diagnosis present

## 2015-11-23 DIAGNOSIS — Z66 Do not resuscitate: Secondary | ICD-10-CM | POA: Diagnosis present

## 2015-11-23 HISTORY — DX: Liver disease, unspecified: K76.9

## 2015-11-23 LAB — CBC AND DIFFERENTIAL
Basophils %: 0.6 % (ref 0.0–3.0)
Basophils Absolute: 0.1 10*3/uL (ref 0.0–0.3)
Eosinophils %: 3.3 % (ref 0.0–7.0)
Eosinophils Absolute: 0.4 10*3/uL (ref 0.0–0.8)
Hematocrit: 40.1 % (ref 36.0–48.0)
Hemoglobin: 14.1 gm/dL (ref 12.0–16.0)
Lymphocytes Absolute: 1.5 10*3/uL (ref 0.6–5.1)
Lymphocytes: 12 % — ABNORMAL LOW (ref 15.0–46.0)
MCH: 39 pg — ABNORMAL HIGH (ref 28–35)
MCHC: 35 gm/dL (ref 32–36)
MCV: 111 fL — ABNORMAL HIGH (ref 80–100)
MPV: 6.3 fL (ref 6.0–10.0)
Monocytes Absolute: 0.9 10*3/uL (ref 0.1–1.7)
Monocytes: 7 % (ref 3.0–15.0)
Neutrophils %: 77.1 % (ref 42.0–78.0)
Neutrophils Absolute: 9.4 10*3/uL — ABNORMAL HIGH (ref 1.7–8.6)
PLT CT: 469 10*3/uL — ABNORMAL HIGH (ref 130–440)
RBC: 3.61 10*6/uL — ABNORMAL LOW (ref 3.80–5.00)
RDW: 13.4 % (ref 11.0–14.0)
WBC: 12.2 10*3/uL — ABNORMAL HIGH (ref 4.0–11.0)

## 2015-11-23 LAB — BASIC METABOLIC PANEL
Anion Gap: 14.9 mMol/L (ref 7.0–18.0)
BUN / Creatinine Ratio: 7.9 Ratio — ABNORMAL LOW (ref 10.0–30.0)
BUN: 8 mg/dL (ref 7–22)
CO2: 20.1 mMol/L (ref 20.0–30.0)
Calcium: 8.7 mg/dL (ref 8.5–10.5)
Chloride: 104 mMol/L (ref 98–110)
Creatinine: 1.01 mg/dL (ref 0.60–1.20)
EGFR: 63 mL/min/{1.73_m2} (ref 60–150)
Glucose: 95 mg/dL (ref 70–99)
Osmolality Calc: 270 mOsm/kg — ABNORMAL LOW (ref 275–300)
Potassium: 3 mMol/L — CL (ref 3.5–5.3)
Sodium: 136 mMol/L (ref 136–147)

## 2015-11-23 LAB — COMPREHENSIVE METABOLIC PANEL
ALT: 15 U/L (ref 0–55)
AST (SGOT): 33 U/L (ref 10–42)
Albumin/Globulin Ratio: 0.68 Ratio — ABNORMAL LOW (ref 0.70–1.50)
Albumin: 3 gm/dL — ABNORMAL LOW (ref 3.5–5.0)
Alkaline Phosphatase: 147 U/L — ABNORMAL HIGH (ref 40–145)
Anion Gap: 16.6 mMol/L (ref 7.0–18.0)
BUN / Creatinine Ratio: 5.8 Ratio — ABNORMAL LOW (ref 10.0–30.0)
BUN: 8 mg/dL (ref 7–22)
Bilirubin, Total: 0.6 mg/dL (ref 0.1–1.2)
CO2: 21 mMol/L (ref 20.0–30.0)
Calcium: 9.7 mg/dL (ref 8.5–10.5)
Chloride: 98 mMol/L (ref 98–110)
Creatinine: 1.39 mg/dL — ABNORMAL HIGH (ref 0.60–1.20)
EGFR: 43 mL/min/{1.73_m2} — ABNORMAL LOW (ref 60–150)
Globulin: 4.4 gm/dL — ABNORMAL HIGH (ref 2.0–4.0)
Glucose: 125 mg/dL — ABNORMAL HIGH (ref 70–99)
Osmolality Calc: 264 mOsm/kg — ABNORMAL LOW (ref 275–300)
Potassium: 3.6 mMol/L (ref 3.5–5.3)
Protein, Total: 7.4 gm/dL (ref 6.0–8.3)
Sodium: 132 mMol/L — ABNORMAL LOW (ref 136–147)

## 2015-11-23 LAB — HEMOGLOBIN AND HEMATOCRIT, BLOOD
Hematocrit: 36.3 % (ref 36.0–48.0)
Hemoglobin: 12.2 gm/dL (ref 12.0–16.0)

## 2015-11-23 LAB — LIPASE: Lipase: 602 U/L — ABNORMAL HIGH (ref 8–78)

## 2015-11-23 LAB — CALCIUM, IONIZED: Calcium, Ionized: 4.33 mg/dL — ABNORMAL LOW (ref 4.35–5.10)

## 2015-11-23 LAB — AMYLASE: Amylase: 127 U/L (ref 30–135)

## 2015-11-23 MED ORDER — VH HYDROMORPHONE HCL 1 MG/ML (NARRATOR)
INTRAMUSCULAR | Status: AC
Start: 2015-11-23 — End: ?
  Filled 2015-11-23: qty 1

## 2015-11-23 MED ORDER — MORPHINE SULFATE 2 MG/ML IJ/IV SOLN (WRAP)
2.0000 mg | Freq: Once | Status: AC
Start: 2015-11-23 — End: 2015-11-23
  Administered 2015-11-23: 2 mg via INTRAVENOUS

## 2015-11-23 MED ORDER — POTASSIUM CHLORIDE CRYS ER 20 MEQ PO TBCR
40.0000 meq | EXTENDED_RELEASE_TABLET | Freq: Once | ORAL | Status: AC
Start: 2015-11-23 — End: 2015-11-23
  Administered 2015-11-23: 40 meq via ORAL
  Filled 2015-11-23 (×2): qty 2

## 2015-11-23 MED ORDER — ACETAMINOPHEN 650 MG RE SUPP
325.0000 mg | RECTAL | Status: DC | PRN
Start: 2015-11-23 — End: 2015-11-27

## 2015-11-23 MED ORDER — IODIXANOL 320 MG/ML IV SOLN
100.0000 mL | Freq: Once | INTRAVENOUS | Status: AC | PRN
Start: 2015-11-23 — End: 2015-11-23
  Administered 2015-11-23: 100 mL via INTRAVENOUS

## 2015-11-23 MED ORDER — MORPHINE SULFATE 2 MG/ML IJ/IV SOLN (WRAP)
Status: AC
Start: 2015-11-23 — End: ?
  Filled 2015-11-23: qty 1

## 2015-11-23 MED ORDER — ONDANSETRON 4 MG PO TBDP
4.0000 mg | ORAL_TABLET | Freq: Three times a day (TID) | ORAL | Status: DC | PRN
Start: 2015-11-23 — End: 2015-11-27

## 2015-11-23 MED ORDER — VH HYDROMORPHONE HCL PF 1 MG/ML CARPUJECT
1.0000 mg | INTRAMUSCULAR | Status: DC | PRN
Start: 2015-11-23 — End: 2015-11-24
  Administered 2015-11-23 – 2015-11-24 (×4): 1 mg via INTRAVENOUS
  Filled 2015-11-23 (×4): qty 1

## 2015-11-23 MED ORDER — SODIUM CHLORIDE 0.9 % IV BOLUS
500.0000 mL | Freq: Once | INTRAVENOUS | Status: AC
Start: 2015-11-23 — End: 2015-11-23
  Administered 2015-11-23: 500 mL via INTRAVENOUS

## 2015-11-23 MED ORDER — ACETAMINOPHEN 650 MG RE SUPP
650.0000 mg | RECTAL | Status: DC | PRN
Start: 2015-11-23 — End: 2015-11-27

## 2015-11-23 MED ORDER — VH HYDROMORPHONE HCL PF 1 MG/ML CARPUJECT
1.0000 mg | Freq: Once | INTRAMUSCULAR | Status: AC
Start: 2015-11-23 — End: 2015-11-23
  Administered 2015-11-23: 1 mg via INTRAVENOUS

## 2015-11-23 MED ORDER — ACETAMINOPHEN 325 MG PO TABS
325.0000 mg | ORAL_TABLET | ORAL | Status: DC | PRN
Start: 2015-11-23 — End: 2015-11-27

## 2015-11-23 MED ORDER — SODIUM CHLORIDE 0.9 % IV BOLUS
1000.0000 mL | Freq: Once | INTRAVENOUS | Status: AC
Start: 2015-11-23 — End: 2015-11-23
  Administered 2015-11-23: 1000 mL via INTRAVENOUS

## 2015-11-23 MED ORDER — ACETAMINOPHEN 160 MG/5ML PO SOLN
650.0000 mg | ORAL | Status: DC | PRN
Start: 2015-11-23 — End: 2015-11-27

## 2015-11-23 MED ORDER — CEROVITE ADVANCED FORMULA PO TABS
1.0000 | ORAL_TABLET | Freq: Every morning | ORAL | Status: DC
Start: 2015-11-23 — End: 2015-11-27
  Administered 2015-11-24 – 2015-11-27 (×4): 1 via ORAL
  Filled 2015-11-23 (×5): qty 1

## 2015-11-23 MED ORDER — ACETAMINOPHEN 325 MG PO TABS
650.0000 mg | ORAL_TABLET | ORAL | Status: DC | PRN
Start: 2015-11-23 — End: 2015-11-27

## 2015-11-23 MED ORDER — ONDANSETRON HCL 4 MG/2ML IJ SOLN
4.0000 mg | Freq: Three times a day (TID) | INTRAMUSCULAR | Status: DC | PRN
Start: 2015-11-23 — End: 2015-11-27
  Administered 2015-11-23 – 2015-11-25 (×3): 4 mg via INTRAVENOUS
  Filled 2015-11-23 (×3): qty 2

## 2015-11-23 MED ORDER — LACTATED RINGERS IV SOLN
INTRAVENOUS | Status: DC
Start: 2015-11-23 — End: 2015-11-25

## 2015-11-23 MED ORDER — LABETALOL HCL 200 MG PO TABS
200.0000 mg | ORAL_TABLET | Freq: Two times a day (BID) | ORAL | Status: DC
Start: 2015-11-23 — End: 2015-11-27
  Administered 2015-11-24 – 2015-11-27 (×6): 200 mg via ORAL
  Filled 2015-11-23 (×9): qty 1

## 2015-11-23 MED ORDER — ONDANSETRON HCL 4 MG/2ML IJ SOLN
4.0000 mg | Freq: Once | INTRAMUSCULAR | Status: AC
Start: 2015-11-23 — End: 2015-11-23
  Administered 2015-11-23: 4 mg via INTRAVENOUS

## 2015-11-23 MED ORDER — ENOXAPARIN SODIUM 40 MG/0.4ML SC SOLN
40.0000 mg | SUBCUTANEOUS | Status: DC
Start: 2015-11-23 — End: 2015-11-27
  Filled 2015-11-23 (×5): qty 0.4

## 2015-11-23 MED ORDER — ONDANSETRON HCL 4 MG/2ML IJ SOLN
INTRAMUSCULAR | Status: AC
Start: 2015-11-23 — End: ?
  Filled 2015-11-23: qty 2

## 2015-11-23 MED ORDER — IOHEXOL 240 MG/ML IJ SOLN
50.0000 mL | Freq: Once | INTRAMUSCULAR | Status: DC
Start: 2015-11-23 — End: 2015-11-23

## 2015-11-23 MED ORDER — PANTOPRAZOLE SODIUM 40 MG PO TBEC
40.0000 mg | DELAYED_RELEASE_TABLET | Freq: Every day | ORAL | Status: DC
Start: 2015-11-24 — End: 2015-11-27
  Administered 2015-11-24 – 2015-11-27 (×4): 40 mg via ORAL
  Filled 2015-11-23 (×4): qty 1

## 2015-11-23 NOTE — ED Provider Notes (Signed)
Physician/Midlevel provider first contact with patient: 11/23/15 1155         History     Chief Complaint   Patient presents with   . Abdominal Pain     Patient is a 55 y.o. female presenting with abdominal pain. The history is provided by the patient.   Abdominal Pain  Pain location:  Epigastric (Patient with a history of alcoholism, pancreatitis presents with epigastric abdominal pain radiating through to her back ongoing for 1 week recently treated with Tramadol as an outpatient without improvement)  Pain quality: sharp    Pain radiates to:  Back  Pain severity:  Moderate  Onset quality:  Gradual  Duration:  1 week  Timing:  Constant  Progression:  Worsening  Chronicity:  Recurrent  Context: alcohol use    Context comment:  Patient typically drinks vodka her most recent ingestion was last week  Relieved by:  Nothing  Worsened by:  Eating  Ineffective treatments: Tramadol.  Associated symptoms: anorexia, chest pain, nausea and vomiting    Associated symptoms: no chills, no constipation, no cough, no diarrhea, no dysuria, no fever, no hematemesis, no hematochezia, no hematuria, no melena, no shortness of breath, no sore throat, no vaginal bleeding and no vaginal discharge    Associated symptoms comment:  Patient seen in the ED on 10/21/15 with atraumatic left sided rib pain diagnosed by plain films with left 10th rib fracture notes persistent discomfort left lateral chest unchanged  Risk factors: alcohol abuse, multiple surgeries and obesity    Risk factors: no aspirin use, not elderly, no NSAID use, not pregnant and no recent hospitalization    Risk factors comment:  Status post cholecystectomy           Past Medical History   Diagnosis Date   . Hypertension    . Asthma without status asthmaticus    . Gastroesophageal reflux disease    . Pancreatitis    . DVT (deep venous thrombosis)    . S/P IVC filter    . Clostridium difficile colitis    . H/O ETOH abuse      sober 2 years until 03/2014       Past Surgical  History   Procedure Laterality Date   . Colonoscopy  12/26/2012     Procedure: COLONOSCOPY;  Surgeon: Gwenith Spitz, MD;  Location: [facility];  Service: Gastroenterology;  Laterality: N/A;   . Colonoscopy, polypectomy  12/26/2012     Procedure: COLONOSCOPY, POLYPECTOMY;  Surgeon: Gwenith Spitz, MD;  Location: [facility];  Service: Gastroenterology;  Laterality: N/A;   . Cholecystectomy     . Pancreatectomy  01/2011     partial       Family History   Problem Relation Age of Onset   . Adopted: Yes       Social  Social History   Substance Use Topics   . Smoking status: Current Every Day Smoker -- 1.00 packs/day for 30 years     Types: Cigarettes   . Smokeless tobacco: Never Used   . Alcohol Use: Yes      Comment: occasional       .     Allergies   Allergen Reactions   . Morphine Nausea Only   . Percocet [Oxycodone-Acetaminophen] Other (See Comments)     Makes her "loopy."       Home Medications     Last Medication Reconciliation Action:  Pharmacy Completed [facility] [place], [facility] 11/23/2015  3:03 PM  amLODIPine (NORVASC) 10 MG tablet     Take 10 mg by mouth every morning.        ibuprofen (ADVIL,MOTRIN) 200 MG tablet     Take 400 mg by mouth every 6 (six) hours as needed for Pain.         labetalol (NORMODYNE) 100 MG tablet     Take 200 mg by mouth 2 (two) times daily. Take two 100 mg tablets for a total dose of 200 mg every 12 hours       Multiple Vitamins-Minerals (MULTIVITAMIN WITH MINERALS) tablet     Take 1 tablet by mouth every morning.        pantoprazole (PROTONIX) 40 MG tablet     Take 40 mg by mouth every morning.        potassium chloride (K-DUR,KLOR-CON) 20 MEQ tablet     Take 1 tablet (20 mEq total) by mouth 2 (two) times daily.     traMADol (ULTRAM) 50 MG tablet     Take 1-2 tablets (50-100 mg total) by mouth every 8 (eight) hours as needed.           Review of Systems   Constitutional: Negative for fever and chills.   HENT: Negative for congestion, ear pain, rhinorrhea and  sore throat.    Eyes: Negative for discharge and redness.   Respiratory: Negative for cough and shortness of breath.    Cardiovascular: Positive for chest pain. Negative for leg swelling.   Gastrointestinal: Positive for nausea, vomiting, abdominal pain and anorexia. Negative for diarrhea, constipation, blood in stool, melena, hematochezia and hematemesis.   Genitourinary: Negative for dysuria, frequency, hematuria, flank pain, vaginal bleeding, vaginal discharge and menstrual problem.   Musculoskeletal: Negative for arthralgias.   Skin: Negative for rash.   Neurological: Negative for seizures and headaches.   Hematological: Negative for adenopathy. Does not bruise/bleed easily.   Psychiatric/Behavioral: Negative for suicidal ideas.       Physical Exam    BP: 113/71 mmHg, Heart Rate: 69, Temp: 98.1 F (36.7 C), Resp Rate: (!) 26, SpO2: 98 %, Weight: 85.1 kg    Physical Exam   Constitutional: She is oriented to person, place, and time. She appears well-developed and well-nourished. No distress.   HENT:   Head: Normocephalic and atraumatic.   Right Ear: External ear normal.   Left Ear: External ear normal.   Nose: Nose normal.   Mouth/Throat: Oropharynx is clear and moist. No oropharyngeal exudate.   Eyes: Conjunctivae and EOM are normal. Pupils are equal, round, and reactive to light. Right eye exhibits no discharge. Left eye exhibits no discharge. Right conjunctiva is not injected. No scleral icterus.   Neck: Normal range of motion. Neck supple. No tracheal deviation present. No thyroid mass and no thyromegaly present.   Cardiovascular: Normal rate, regular rhythm, normal heart sounds and intact distal pulses.  Exam reveals no gallop and no friction rub.    No murmur heard.  Pulmonary/Chest: Breath sounds normal. No accessory muscle usage or stridor. No respiratory distress. She has no wheezes. She has no rales.   Abdominal: Soft. Bowel sounds are normal. She exhibits no distension and no mass. There is no  hepatosplenomegaly. There is no tenderness. There is no rebound and no guarding.   Musculoskeletal: Normal range of motion. She exhibits no edema or tenderness.   Neurological: She is alert and oriented to person, place, and time. No cranial nerve deficit. She exhibits normal muscle tone. Coordination normal.   Skin: Skin  is warm and dry. No rash noted. She is not diaphoretic. No erythema. No pallor.   Psychiatric: She has a normal mood and affect. Her behavior is normal.   Nursing note and vitals reviewed.        MDM and ED Course     ED Medication Orders     Start Ordered     Status Ordering Provider    11/23/15 1348 11/23/15 1347  HYDROmorphone (DILAUDID) injection 1 mg   Once in ED     Route: Intravenous  Ordered Dose: 1 mg     Last MAR action:  Given Fabian Sharp    11/23/15 1348 11/23/15 1347  sodium chloride 0.9 % bolus 1,000 mL   Once in ED     Route: Intravenous  Ordered Dose: 1,000 mL     Last MAR action:  New Bag Sonia Baller R    11/23/15 1143 11/23/15 1142  morphine injection 2 mg   Once in ED     Route: Intravenous  Ordered Dose: 2 mg     Last MAR action:  Given TURNBULL, CHARLES C    11/23/15 1143 11/23/15 1142  ondansetron (ZOFRAN) injection 4 mg   Once in ED     Route: Intravenous  Ordered Dose: 4 mg     Last MAR action:  Given TURNBULL, CHARLES C    11/23/15 1143 11/23/15 1142  sodium chloride 0.9 % bolus 500 mL   Once in ED     Route: Intravenous  Ordered Dose: 500 mL     Last MAR action:  Stopped TURNBULL, CHARLES C    11/23/15 1143 11/23/15 1142     Once in ED,   Status:  Discontinued     Route: Oral  Ordered Dose: 50 mL     Discontinued TURNBULL, CHARLES C             MDM  Number of Diagnoses or Management Options  Acute abdominal pain: new and requires workup  Alcohol-induced acute pancreatitis, unspecified complication status: new and requires workup  Chronic alcohol abuse: established and worsening  Essential hypertension: established and improving  Intractable vomiting with  nausea, unspecified vomiting type: new and requires workup  Moderate dehydration: new and requires workup  Diagnosis management comments: The patient presents to the Emergency Department with abdominal pain. Treatment has been initiated in the ER, but the patient has not had significant improvement in symptoms, appears ill enough and/or has illness/findings/co-morbidities that make admission for IV medications, possible surgical consult, and further management the most appropriate disposition. Differential diagnosis has included but is not limited to appendicitis, gall bladder disease, bowel obstruction, colitis, gastroenteritis, urinary tract obstruction, AAA, pancreatitic and hepatitis.  Diagnostic impression and plan were discussed and agreed upon with the patient and/or family.  Results of lab/radiology tests were reviewed and discussed with the patient and/or family. All questions were answered and concerns addressed.  Appropriate consultation was made for admission and further treatment of this patient.               Amount and/or Complexity of Data Reviewed  Clinical lab tests: ordered and reviewed  Tests in the radiology section of CPT: ordered and reviewed  Tests in the medicine section of CPT: ordered and reviewed  Decide to obtain previous medical records or to obtain history from someone other than the patient: yes  Discuss the patient with other providers: yes  Independent visualization of images, tracings, or specimens: yes    Risk  of Complications, Morbidity, and/or Mortality  Presenting problems: moderate  Diagnostic procedures: low  Management options: moderate              Procedures    Clinical Impression & Disposition     Clinical Impression  Final diagnoses:   Alcohol-induced acute pancreatitis, unspecified complication status   Acute abdominal pain   Intractable vomiting with nausea, unspecified vomiting type   Moderate dehydration   Chronic alcohol abuse   Essential hypertension        ED  Disposition     Admit Admitting Physician: Donnie Coffin [[id number]]  Diagnosis: Pancreatitis [202663]  Estimated Length of Stay: 3 - 5 Days  Tentative Discharge Plan?: Home or Self Care [1]  Patient Class: Inpatient [101]  I certify that inpatient services are medically necessary for this patient. Please see H&P and MD progress notes for additional information about the patient's course of treatment. For Medicare patients, services provided in accordance with 412.3 and expected LOS to be greater than 2 midnights for Medicare patients.: Yes             New Prescriptions    No medications on file                   Fabian Sharp, MD  11/23/15 [phone]

## 2015-11-23 NOTE — ED Notes (Signed)
Pt presents for LUQ and L side pain for approx 1 week. C/o cold chills, nausea, vomiting and dry heaves. Denies diarrhea or urinary symptoms. Pt has 2 L rib fractures from 1 month ago and has difficulty taking a deep breath which has improved over the last two days.

## 2015-11-23 NOTE — Consults (Signed)
[name] was given a Tobacco use screening during today's visit.    Tobacco Screen was Positive.    In discussing this concern, Patient reports she currently uses Tobacco products daily, and does not feel she is ready to completely cease Tobacco use at this time due to recently beginning to decrease her alcohol use. Patient reports she recently began decreasing her alcohol use due to medical concerns directly related to alcohol abuse. She reports for the past several weeks she has not had more than 4 shots of liquor in one week total. She shared she feels she can continue to decrease this use on her own, at which time she will consider addressing her Tobacco concern.    Provided Patient with education on resources to assist with addressing chemical use concerns and encouraged Patient to reach out to SBIRT Program if additional assistance is needed. No SI/HI reported.    In total, 10 minutes of personal time was spent administering and interpreting the screen, plus performing a brief intervention.

## 2015-11-23 NOTE — Progress Notes (Addendum)
Bedside report received and assumed care of pt.  White board updated. Pt resting quietly, no complaints voiced.  Labs being drawn.  IVF infusing.  Call bell in reach

## 2015-11-23 NOTE — H&P (Signed)
HISTORY AND PHYSICAL - [facility]    Date Time: 11/23/2015 3:22 PM  Patient Name: [name]  Attending Physician: Fabian Sharp, MD  Primary Care Physician: Cain Saupe, MD    CC:   Chief Complaint   Patient presents with   . Abdominal Pain       Assessment/Plan                                                                              [facility]     pancreatitis  Start IVF @ 250/hr  CT shows pancreatits  npo  Inc lipase  Has history of cholecystectomy    CT abd:  Redemonstration of marked pancreatic ductal dilatation, somewhat more prominent than the prior examination, with superimposed changes of acute pancreatitis. There is also irregular wall thickening of the antrum of the stomach and first portion of the duodenum which may be reactive. Follow-up imaging to document resolution is recommended. 2. Again noted are multifocal cystic irregularities in the liver, mostly within segment 5 and 8 possibly related to intrahepatic biliary ductal dilatation. An MRI of the abdomen with and without contrast can be performed for further characterization. 3. 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    AKI  1.39   Will cont to monitor    Alcohol abuse  Last drank last week    Hypertension  Continuing home BP meds w/ holding parameters.   GERD  Continuing PPI.   Tobacco use  Encourage cessation  Asthma with no exacerbation  Cont bronchodilators        Nutrition: npo  GI Prophylaxis: add a H2 agent  DVT Prophylaxis: low molecular weight heparin and SCD's while in bed  Code Status: do not resuscitate   DNR with bipap no pressors       Service status: INPATIENT   risk of morbidity and mortality and risk of progressive disease    Anticipate discharge in:4 days.  History of Presenting Illness:                                                          [facility]     [name] is a 55 y.o. female who has a h/o pancreatitis  presents to the hospital with epigastric pain that also goes into her back at times.  It has been worsening over a week.  She has some nausea but has not been eating or drinking.  Recently had her ribs broken a week ago.      No chest pain or sob.      She has had alcohol but not in the past week.  She drinks vodka typically      Review Of Systems:  [facility]   1. Constitutional:  Denies fever, weight loss or fatigue.  2. Eyes:  Denies blurry vision or double vision.  3. ENT:  Denies sore throat or trouble swallowing.  4. CV:  Denies chest pain or palpitations.  5. Respiratory:  Denies SOB or cough.  6. GI:  Denies vomiting, diarrhea, constipation  7. GU: Denies pain or burning with urination.  8. MS: Denies new muscle or joint pain.  9. Skin: Denies rash.  10. Neuro: Denies any new areas of numbness or weakness.  11. Psych: Denies being depressed or anxious.  12. Heme: Denies any bleeding or recent anemia.  13. Endocrine: Denies heat or cold intolerance. Denies excessive thirst or urination.  14. Immune:  Denies seasonal allergies or asthma exacerbation.    Past Medical History:                                                                        [facility]     Past Medical History   Diagnosis Date   . Hypertension    . Asthma without status asthmaticus    . Gastroesophageal reflux disease    . Pancreatitis    . DVT (deep venous thrombosis)    . S/P IVC filter    . Clostridium difficile colitis    . H/O ETOH abuse      sober 2 years until 03/2014       Past Surgical History:                                                                       [facility]     Past Surgical History   Procedure Laterality Date   . Colonoscopy  12/26/2012     Procedure: COLONOSCOPY;  Surgeon: Gwenith Spitz, MD;  Location: [facility];  Service: Gastroenterology;  Laterality: N/A;   . Colonoscopy, polypectomy  12/26/2012      Procedure: COLONOSCOPY, POLYPECTOMY;  Surgeon: Gwenith Spitz, MD;  Location: [facility];  Service: Gastroenterology;  Laterality: N/A;   . Cholecystectomy     . Pancreatectomy  01/2011     partial       Family History:                                                                                    [facility]     Family History   Problem Relation Age of Onset   . Adopted: Yes       Social History:  [facility]     Social History     Social History   . Marital Status: Single     Spouse Name: N/A   . Number of Children: N/A   . Years of Education: N/A     Occupational History   . Not on file.     Social History Main Topics   . Smoking status: Current Every Day Smoker -- 1.00 packs/day for 30 years     Types: Cigarettes   . Smokeless tobacco: Never Used   . Alcohol Use: Yes      Comment: occasional   . Drug Use: No   . Sexual Activity: Not on file     Other Topics Concern   . Not on file     Social History Narrative       Code Status: Prior  Allergies:                                                                                             [facility]     Allergies   Allergen Reactions   . Morphine Nausea Only   . Percocet [Oxycodone-Acetaminophen] Other (See Comments)     Makes her "loopy."       Medications:                                                                                       [facility]     No current facility-administered medications on file prior to encounter.     Current Outpatient Prescriptions on File Prior to Encounter   Medication Sig Dispense Refill   . amLODIPine (NORVASC) 10 MG tablet Take 10 mg by mouth every morning.        Marland Kitchen ibuprofen (ADVIL,MOTRIN) 200 MG tablet Take 400 mg by mouth every 6 (six) hours as needed for Pain.         Marland Kitchen labetalol (NORMODYNE) 100 MG tablet Take 200 mg by mouth 2 (two) times daily. Take two 100 mg tablets for a total dose of 200 mg  every 12 hours       . Multiple Vitamins-Minerals (MULTIVITAMIN WITH MINERALS) tablet Take 1 tablet by mouth every morning.        . pantoprazole (PROTONIX) 40 MG tablet Take 40 mg by mouth every morning.        . potassium chloride (K-DUR,KLOR-CON) 20 MEQ tablet Take 1 tablet (20 mEq total) by mouth 2 (two) times daily. 10 tablet 0   . traMADol (ULTRAM) 50 MG tablet Take 1-2 tablets (50-100 mg total) by mouth every 8 (eight) hours as needed. 20 tablet 0   . [DISCONTINUED] predniSONE (DELTASONE) 1 MG tablet Take 4 tablets for 3 days, take 3 tablets for 3 days, take 2 tablets for 3 days, take 1  tablet for 3 days. Total of 30 tablets 30 tablet 0   . [DISCONTINUED] vitamin B-1 (THIAMINE) 100 MG tablet Take 1 tablet (100 mg total) by mouth daily. 30 tablet 0         Physical Exam:                                                                                   [facility]     Patient Vitals for the past 24 hrs:   BP Temp Temp src Pulse Resp SpO2 Height Weight   11/23/15 1516 105/60 mmHg - - - - 96 % - -   11/23/15 1501 116/73 mmHg - - - - 97 % - -   11/23/15 1446 101/77 mmHg - - - - 94 % - -   11/23/15 1431 103/64 mmHg - - - - 92 % - -   11/23/15 1426 118/69 mmHg - - - - 98 % - -   11/23/15 1425 118/69 mmHg - - - 20 98 % - -   11/23/15 1323 117/75 mmHg - - 64 22 98 % - -   11/23/15 1047 113/71 mmHg 98.1 F (36.7 C) Oral 69 (!) 26 98 % 1.676 m (5\' 6" ) 85.1 kg (187 lb 9.8 oz)     Body mass index is 30.3 kg/(m^2).  No intake or output data in the 24 hours ending 11/23/15 1522    General: awake, alert, oriented x 3; no acute distress.  HEENT: perrla, eomi, sclera anicteric  oropharynx clear without lesions, mucous membranes moist  Neck: supple, no lymphadenopathy, no thyromegaly, no JVD, no carotid bruits  Cardiovascular: regular rate and rhythm, no murmurs, rubs or gallops  Lungs: clear to auscultation bilaterally, without wheezing, rhonchi, or rales  Abdomen: soft, diffuse abdominal tenderness; abdominal hernia  chronic, non-distended; no palpable masses, no hepatosplenomegaly, normoactive bowel sounds, no rebound or guarding  Extremities: no clubbing, cyanosis, or edema  Neuro: cranial nerves grossly intact, strength 5/5 in upper and lower extremities, sensation intact,   Skin: no rashes or lesions noted      Labs and Imaging:                                                                              [facility]     Results     Procedure Component Value Units Date/Time    CBC and differential [[id number]]  (Abnormal) Collected:  11/23/15 1134    Specimen Information:  Blood from Blood Updated:  11/23/15 1208     WBC 12.2 (H) K/cmm      RBC 3.61 (L) M/cmm      Hemoglobin 14.1 gm/dL      Hematocrit 40.9 %      MCV 111 (H) fL      MCH 39 (H) pg      MCHC 35 gm/dL  RDW 13.4 %      PLT CT 469 (H) K/cmm      MPV 6.3 fL      NEUTROPHIL % 77.1 %      Lymphocytes 12.0 (L) %      Monocytes 7.0 %      Eosinophils % 3.3 %      Basophils % 0.6 %      Neutrophils Absolute 9.4 (H) K/cmm      Lymphocytes Absolute 1.5 K/cmm      Monocytes Absolute 0.9 K/cmm      Eosinophils Absolute 0.4 K/cmm      BASO Absolute 0.1 K/cmm      RBC Morphology RBC Morphology Reviewed      Macrocytic 2+      Anisocytosis 2+      Poikilocytosis 1+     Comprehensive metabolic panel [[id number]]  (Abnormal) Collected:  11/23/15 1134    Specimen Information:  Plasma Updated:  11/23/15 1200     Sodium 132 (L) mMol/L      Potassium 3.6 mMol/L      Chloride 98 mMol/L      CO2 21.0 mMol/L      Calcium 9.7 mg/dL      Glucose 409 (H) mg/dL      Creatinine 8.11 (H) mg/dL      BUN 8 mg/dL      Protein, Total 7.4 gm/dL      Albumin 3.0 (L) gm/dL      Alkaline Phosphatase 147 (H) U/L      ALT 15 U/L      AST (SGOT) 33 U/L      Bilirubin, Total 0.6 mg/dL      Albumin/Globulin Ratio 0.68 (L) Ratio      Anion Gap 16.6 mMol/L      BUN/Creatinine Ratio 5.8 (L) Ratio      EGFR 43 (L) mL/min/1.71m2      Osmolality Calc 264 (L) mOsm/kg      Globulin 4.4 (H) gm/dL      Lipase [[id number]]  (Abnormal) Collected:  11/23/15 1134    Specimen Information:  Plasma Updated:  11/23/15 1200     Lipase 602 (H) U/L           CT Abdomen Pelvis with IV Cont   Final Result      1. Redemonstration of marked pancreatic ductal dilatation, somewhat more prominent than the prior examination, with superimposed changes of acute pancreatitis. There is also irregular wall thickening of the antrum of the stomach and first portion of the    duodenum which may be reactive. Follow-up imaging to document resolution is recommended.      2. Again noted are multifocal cystic irregularities in the liver, mostly within segment 5 and 8 possibly related to intrahepatic biliary ductal dilatation. An MRI of the abdomen with and without contrast can be performed for further characterization.      3. 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         ReadingStation:[id number]          Signed by: Donnie Coffin, DO    I have personally reviewed past records in Epic and recent labs  New orders were placed      [facility]  [address]  [place], [place]    Pager [phone]    QM:VHQION, Alecia Lemming, MD      This note was generated within an electronic medical record, and portions of it  may have been completed with voice recognition software. Typographical, word substitution, pronoun and other language errors may occur. If there are substantial concerns about the content of this note that may affect patient care, please contact the author for clarification.

## 2015-11-23 NOTE — Plan of Care (Signed)
Problem: Moderate/High Fall Risk Score >5  Goal: Patient will remain free of falls  Outcome: Adequate for Discharge  Pt oriented to room and call light.  Pt is ambulatory and independent in the room.  Pt will remain injury free this admission.    Problem: Altered GI Function  Goal: Fluid and electrolyte balance are achieved/maintained  Outcome: Progressing  Fluid and electrolyte balance will be achieved as evidenced by monitoring of lab work every 8 hrs and monitoring of vital signs every 4 hrs.  MD will be notified of any abnormal findings.  IVF infusing.   Goal: Elimination patterns are normal or improving  Outcome: Progressing  Elimination patterns will return to normal as evidenced by pt having a regular bowel movement.      Problem: Pain interferes with ability to perform ADL  Goal: Pain at adequate level as identified by patient  Outcome: Progressing  Pain will be at adequate level as evidenced by monitoring of effectiveness using numerical pain scale    Problem: Side Effects from Pain Analgesia  Goal: Patient will experience minimal side effects of analgesic therapy  Outcome: Progressing  Pt will experience minimal side effects as evidenced by lack of GI complaint of nausea/vomiting.

## 2015-11-23 NOTE — Progress Notes (Signed)
11/23/15 2042   Provider Notification   Reason for Communication Critical lab value   Critical Lab Chemistry   Critical Lab Value K+ 3   Provider Name [facility]   Provider Role Hospitalist   Method of Communication Page   Readback Results Yes   Response See orders  (40 meq potassium x1)

## 2015-11-24 DIAGNOSIS — Z90411 Acquired partial absence of pancreas: Secondary | ICD-10-CM | POA: Insufficient documentation

## 2015-11-24 DIAGNOSIS — K859 Acute pancreatitis without necrosis or infection, unspecified: Secondary | ICD-10-CM

## 2015-11-24 DIAGNOSIS — K860 Alcohol-induced chronic pancreatitis: Secondary | ICD-10-CM

## 2015-11-24 LAB — BASIC METABOLIC PANEL
Anion Gap: 9.5 mMol/L (ref 7.0–18.0)
Anion Gap: 12.2 mMol/L (ref 7.0–18.0)
BUN / Creatinine Ratio: 6.8 Ratio — ABNORMAL LOW (ref 10.0–30.0)
BUN / Creatinine Ratio: 6.3 Ratio — ABNORMAL LOW (ref 10.0–30.0)
BUN: 6 mg/dL — ABNORMAL LOW (ref 7–22)
BUN: 5 mg/dL — ABNORMAL LOW (ref 7–22)
CO2: 24.8 mMol/L (ref 20.0–30.0)
CO2: 24.1 mMol/L (ref 20.0–30.0)
Calcium: 8.7 mg/dL (ref 8.5–10.5)
Calcium: 8.8 mg/dL (ref 8.5–10.5)
Chloride: 105 mMol/L (ref 98–110)
Chloride: 105 mMol/L (ref 98–110)
Creatinine: 0.88 mg/dL (ref 0.60–1.20)
Creatinine: 0.8 mg/dL (ref 0.60–1.20)
EGFR: 74 mL/min/{1.73_m2} (ref 60–150)
EGFR: 83 mL/min/{1.73_m2} (ref 60–150)
Glucose: 94 mg/dL (ref 70–99)
Glucose: 88 mg/dL (ref 70–99)
Osmolality Calc: 269 mOsm/kg — ABNORMAL LOW (ref 275–300)
Osmolality Calc: 272 mOsm/kg — ABNORMAL LOW (ref 275–300)
Potassium: 3.3 mMol/L — ABNORMAL LOW (ref 3.5–5.3)
Potassium: 3.3 mMol/L — ABNORMAL LOW (ref 3.5–5.3)
Sodium: 136 mMol/L (ref 136–147)
Sodium: 138 mMol/L (ref 136–147)

## 2015-11-24 LAB — CALCIUM, IONIZED
Calcium, Ionized: 4.53 mg/dL (ref 4.35–5.10)
Calcium, Ionized: 4.6 mg/dL (ref 4.35–5.10)

## 2015-11-24 LAB — CBC AND DIFFERENTIAL
Basophils %: 0.7 % (ref 0.0–3.0)
Basophils Absolute: 0.1 10*3/uL (ref 0.0–0.3)
Eosinophils %: 9.2 % — ABNORMAL HIGH (ref 0.0–7.0)
Eosinophils Absolute: 0.7 10*3/uL (ref 0.0–0.8)
Hematocrit: 35 % — ABNORMAL LOW (ref 36.0–48.0)
Hemoglobin: 11.5 gm/dL — ABNORMAL LOW (ref 12.0–16.0)
Lymphocytes Absolute: 1.8 10*3/uL (ref 0.6–5.1)
Lymphocytes: 24.3 % (ref 15.0–46.0)
MCH: 37 pg — ABNORMAL HIGH (ref 28–35)
MCHC: 33 gm/dL (ref 32–36)
MCV: 114 fL — ABNORMAL HIGH (ref 80–100)
MPV: 6.2 fL (ref 6.0–10.0)
Monocytes Absolute: 0.9 10*3/uL (ref 0.1–1.7)
Monocytes: 11.4 % (ref 3.0–15.0)
Neutrophils %: 54.4 % (ref 42.0–78.0)
Neutrophils Absolute: 4.1 10*3/uL (ref 1.7–8.6)
PLT CT: 398 10*3/uL (ref 130–440)
RBC: 3.08 10*6/uL — ABNORMAL LOW (ref 3.80–5.00)
RDW: 13.3 % (ref 11.0–14.0)
WBC: 7.5 10*3/uL (ref 4.0–11.0)

## 2015-11-24 LAB — LIPID PANEL
Cholesterol: 124 mg/dL (ref 75–199)
Coronary Heart Disease Risk: 7.75
HDL: 16 mg/dL — ABNORMAL LOW (ref 45–65)
LDL Calculated: 87 mg/dL
Triglycerides: 105 mg/dL (ref 10–150)
VLDL: 21 (ref 0–40)

## 2015-11-24 LAB — LIPASE: Lipase: 88 U/L — ABNORMAL HIGH (ref 8–78)

## 2015-11-24 LAB — HEMOGLOBIN AND HEMATOCRIT, BLOOD
Hematocrit: 36.3 % (ref 36.0–48.0)
Hemoglobin: 12.1 gm/dL (ref 12.0–16.0)

## 2015-11-24 MED ORDER — POTASSIUM CHLORIDE CRYS ER 20 MEQ PO TBCR
40.0000 meq | EXTENDED_RELEASE_TABLET | Freq: Once | ORAL | Status: AC
Start: 2015-11-24 — End: 2015-11-24
  Administered 2015-11-24: 40 meq via ORAL
  Filled 2015-11-24: qty 2

## 2015-11-24 MED ORDER — HYDROCODONE-ACETAMINOPHEN 5-325 MG PO TABS
1.0000 | ORAL_TABLET | Freq: Four times a day (QID) | ORAL | Status: DC | PRN
Start: 2015-11-24 — End: 2015-11-27
  Administered 2015-11-24 – 2015-11-27 (×7): 1 via ORAL
  Filled 2015-11-24 (×7): qty 1

## 2015-11-24 MED ORDER — VH HYDROMORPHONE HCL PF 1 MG/ML CARPUJECT
1.0000 mg | INTRAMUSCULAR | Status: DC | PRN
Start: 2015-11-24 — End: 2015-11-27
  Administered 2015-11-24 – 2015-11-27 (×7): 1 mg via INTRAVENOUS
  Filled 2015-11-24 (×7): qty 1

## 2015-11-24 NOTE — Progress Notes (Signed)
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 There is also irregular wall thickening of the antrum of the stomach and first portion of the duodenum which may be reactive. Follow-up imaging to document resolution is recommended. 2. Again noted are multifocal cystic irregularities in the liver, mostly within segment 5 and 8 possibly related to intrahepatic biliary ductal dilatation. An MRI of the abdomen with and without contrast can be performed for further characterization. 3. 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   GERD  Continuing PPI.   Tobacco use  Encourage cessation  Asthma with no exacerbation  Cont bronchodilators    Nutrition: Full liquid diet  DVT Prophylaxis: low molecular weight heparin and SCD's while in bed  Code Status: do not resuscitate   DNR with bipap no pressors       Time spent with patient 35  Labs have been ordered for the  morning  Still requiring IV Dilaudid  I have changed nursing orders  Labs and other studies were personally reviewed  MEDICATION changes were made and/or added  Case was discussed with RN      I have spent 35 with the patient discussing the principle problem (listed above), the active hospital problems (listed above), the results of laboratory tests and medications (current therapy and side effects). Greater than 50% of the time was spent counseling the patient and coordinating care with Nursing staff (RN/LPN).                     Subjective  [facility]     CC: Abdominal pain    states her abdominal pain has improved. She is not requiring as much pain medicine and she would like to eat. She denies nausea or vomiting. She denies chest pain or shortness of breath.    Review of Systems:    Constitutional:  Denies fever or fatigue.    ENT:  Denies sore throat or trouble swallowing.    CV:  Denies chest pain or palpitations.    Respiratory:  Denies SOB or cough.   Gastroenterology see above   GU:  Denies pain or burning with urination.    MS:  Denies new muscle or joint pain.    Skin:  Denies rash.    Heme: Denies any bleeding.    Physical Exam:                                                                              [facility]   Patient Vitals for the past 24 hrs:   BP Temp Temp src Pulse Resp SpO2 Weight   11/24/15 0835 115/72 mmHg 98.3 F (36.8 C) Oral (!) 54 (!) 10 94 % -   11/24/15 0618 - - - - - - 84.3 kg (185 lb 13.6 oz)   11/23/15 2319 106/89 mmHg 97.3 F (36.3 C) Oral 67 20 95 % -   11/23/15 2001 104/67 mmHg 98.1 F (36.7 C) - (!) 58 20 95 % -   11/23/15 1637 119/71 mmHg 97.6 F (36.4 C) Oral (!) 58 20 91 % -   11/23/15 1516 105/60 mmHg - - - - 96 % -   11/23/15 1501 116/73 mmHg - - - - 97 % -   11/23/15 1446 101/77 mmHg - - - - 94 % -   11/23/15 1431 103/64 mmHg - - - - 92 % -   11/23/15 1426 118/69  mmHg - - - - 98 % -   11/23/15 1425 118/69 mmHg - - - 20 98 % -        Intake/Output Summary (Last 24 hours) at 11/24/15 1351  Last data filed at 11/24/15 1344   Gross per 24 hour   Intake 3586.67 ml   Output      0 ml   Net 3586.67 ml     Wt Readings from Last 1 Encounters:   11/24/15 84.3 kg (185 lb 13.6 oz)        Constitutional: Well groomed and in no acute distress.    Psych: Alert and oriented X3. Affect is not depressed/anxious.    Eyes: Conjunctiva is pink without purulent drainage.    ENT: Hearing is grossly normal. Mucosa is moist.    CV: Heart shows regular rate and rhythm. Pedal pulses are 2+. Pedal edema absent.    Lungs: Clear to ascultation. No accessory muscle use.    Abd: No masses. No tenderness. Bowel sounds present. No organomegaly.    MS: She is able to ambulate.     Skin: No rash. No nodules.      Meds:  [facility]     Estimated Creatinine Clearance: 86.9 mL/min (based on Cr of 0.8).    Current Facility-Administered Medications   Medication Dose Route Frequency   . enoxaparin  40 mg Subcutaneous Q24H   . labetalol  200 mg Oral BID   . multivitamin  1 tablet Oral QAM   . pantoprazole  40 mg Oral Daily       PRN medications: acetaminophen **OR** acetaminophen **OR** acetaminophen, acetaminophen **OR** acetaminophen, HYDROmorphone, ondansetron **OR** ondansetron    IV Drips:    . lactated ringers 250 mL/hr at 11/24/15 1122       Labs and Imaging:                                                                        [facility]     RECENT LABS (from the last 7 days)    Recent Labs  Lab 11/24/15  1220 11/24/15  0403  11/23/15  1134   WBC  --  7.5  --  12.2*   HEMOGLOBIN 12.1 11.5* More results in Results Review 14.1   HEMATOCRIT 36.3 35.0* More results in Results Review 40.1   PLT CT  --  398  --  469*   More results in Results Review = values in this interval not displayed.                Recent Labs  Lab 11/24/15  1220 11/24/15  0403   GLUCOSE 88 94   SODIUM 138 136   POTASSIUM 3.3* 3.3*   CHLORIDE 105 105   CO2 24.1 24.8   BUN 5* 6*   CREATININE 0.80 0.88   EGFR 83 74   CALCIUM 8.8 8.7           Recent Labs  Lab 11/23/15  1134   ALBUMIN 3.0*   PROTEIN, TOTAL 7.4   BILIRUBIN, TOTAL 0.6   ALKALINE PHOSPHATASE 147*   ALT 15   AST (SGOT) 33          Invalid input(s):  AMORPHOUSUA   Lab Results   Component Value Date    HGBA1CPERCNT 5.3 04/19/2012        Imaging, reviewed and are significant for:  CT Abdomen Pelvis with IV Cont   Final Result      1. Redemonstration of marked pancreatic ductal dilatation, somewhat more prominent than the prior examination, with superimposed changes of acute pancreatitis. There is also irregular wall thickening of the antrum of the stomach and first portion of the    duodenum which may be reactive. Follow-up imaging to document resolution is recommended.      2. Again noted are multifocal cystic irregularities in the liver, mostly within segment 5 and 8 possibly related to intrahepatic biliary ductal dilatation. An MRI of the abdomen with and without contrast can be performed for further characterization.      3. 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         ReadingStation:[email or web address]          Microbiology Results     None          Donnie Coffin, DO  [facility] [place]  [address]  [place], [place]  Pager [phone]          This note was generated within an electronic medical record, and portions of it may have been completed with voice recognition software. Typographical, word substitution, pronoun and other language errors may occur. If there are substantial concerns about the content of this note that may affect patient care, please contact the author for clarification.

## 2015-11-24 NOTE — UM Notes (Addendum)
[facility] Utilization Management Review Sheet    NAME: [name]  MR#: [id number]    CSN#: [id number]    ROOM: 594/594-A AGE: 55 y.o.    ADMIT DATE AND TIME: 11/23/2015 11:35 AM      PATIENT CLASS: Inpatient     ATTENDING PHYSICIAN: Donnie Coffin, DO  PAYOR: R[place]ford Nail #: [id number]    DIAGNOSIS:     ICD-10-CM    1. Alcohol-induced acute pancreatitis, unspecified complication status K85.20    2. Acute abdominal pain R10.9    3. Intractable vomiting with nausea, unspecified vomiting type R11.2    4. Moderate dehydration E86.0    5. Chronic alcohol abuse F10.10    6. Essential hypertension I10        HISTORY:   Past Medical History   Diagnosis Date   . Hypertension    . Asthma without status asthmaticus    . Gastroesophageal reflux disease    . Pancreatitis    . DVT (deep venous thrombosis)    . S/P IVC filter    . Clostridium difficile colitis    . H/O ETOH abuse      sober 2 years until 03/2014   . Disorder of liver      multifocal cystic irregularities   . Ascites    . Hypokalemia        DATE OF REVIEW: 11/24/2015    VITALS: BP 115/72 mmHg  Pulse 54  Temp(Src) 98.3 F (36.8 C) (Oral)  Resp 10  Ht 1.676 m (5\' 6" )  Wt 84.3 kg (185 lb 13.6 oz)  BMI 30.01 kg/m2  SpO2 94%    Admitted via the ED "[name] is a 55 y.o. female who has a h/o pancreatitis presents to the hospital with epigastric pain that also goes into her back at times. It has been worsening over a week. She has some nausea but has not been eating or drinking. Recently had her ribs broken a week ago.   No chest pain or sob.   She has had alcohol but not in the past week. She drinks vodka typically"    Labs: lipase 602, WBC 12.2, platelets 469, sodium 132, glucose 125, creatinine 1.39, albumin 3.0, alkaline phosphatase 147, EGFR 43, potassium 3.0, calcium ionized 4.33, BUN 6,     Imaging: CT abdomen pelvis: "1. Redemonstration of marked pancreatic ductal dilatation, somewhat more prominent than the prior  examination, with superimposed changes of acute pancreatitis. There is also irregular wall thickening of the antrum of the stomach and first portion of the duodenum which may be reactive. Follow-up imaging to document resolution is recommended.  2. Again noted are multifocal cystic irregularities in the liver, mostly within segment 5 and 8 possibly related to intrahepatic biliary ductal dilatation. An MRI of the abdomen with and without contrast can be performed for further characterization.  3. Periumbilical herniation containing a loop of colon similar to the prior examination."    Meds: lovenox sc qd, dilaudid iv x1 in ED, morphine iv x1 in ED, zofran x1 in ED, protonix qd, K-dur qd, IV fluid bolus x1550ml in the ED, IV fluids at 21ml/hr, dilaudid x3, zofran x1    Plans: aggressive IV hydration, NPO, pain management, dvt prophylaxis, monitor electrolytes/lipase, nausea management    Alvin Critchley RN  [facility]  Utilization Review  P [phone]  F [phone]

## 2015-11-24 NOTE — Plan of Care (Signed)
Problem: Moderate/High Fall Risk Score >5  Goal: Patient will remain free of falls  Outcome: Progressing    11/24/15 2258   OTHER   Moderate Risk (6-13) LOW-Fall Interventions Appropriate for Low Fall Risk   Pt up ad lib; verbalizes understanding to call if needing assistance    Problem: Altered GI Function  Goal: Elimination patterns are normal or improving  Outcome: Completed Date Met:  11/24/15  Goal: Nutritional intake is adequate  Outcome: Progressing    11/24/15 2258   Goal/Interventions addressed this shift   Nutritional intake is adequate Assist patient with meals/food selection;Include patient/patient care companion in decisions related to nutrition   Pt was not able to tolerate full liquid diet this evening at dinner- on clear liquids at this time;     Problem: Pain interferes with ability to perform ADL  Goal: Pain at adequate level as identified by patient  Outcome: Progressing    11/24/15 2258   Goal/Interventions addressed this shift   Pain at adequate level as identified by patient Identify patient comfort function goal;Assess pain on admission, during daily assessment and/or before any "as needed" intervention(s);Reassess pain within 30-60 minutes of any procedure/intervention, per Pain Assessment, Intervention, Reassessment (AIR) Cycle;Offer non-pharmacological pain management interventions;Include patient/patient care companion in decisions related to pain management as needed   Reports adequate pain control w/ current orders- and change in diet orders    Problem: Side Effects from Pain Analgesia  Goal: Patient will experience minimal side effects of analgesic therapy  Outcome: Progressing

## 2015-11-24 NOTE — Plan of Care (Signed)
Problem: Moderate/High Fall Risk Score >5  Goal: Patient will remain free of falls  Outcome: Progressing    11/24/15 0959   OTHER   Moderate Risk (6-13) MOD-Initiate Yellow "Fall Risk" magnet communication tool   High (Greater than 13) MOD-Initiate Yellow "Fall Risk" magnet communication tool         Problem: Altered GI Function  Goal: Fluid and electrolyte balance are achieved/maintained  Outcome: Progressing    11/23/15 2045   Goal/Interventions addressed this shift   Fluid and electrolyte balance are achieved/maintained Monitor/assess lab values and report abnormal values;Provide adequate hydration;Assess and reassess fluid and electrolyte status       Goal: Elimination patterns are normal or improving  Outcome: Progressing    11/23/15 2045   Goal/Interventions addressed this shift   Elimination patterns are normal or improving Report abnormal assessment to physician;Monitor for abdominal discomfort;Encourage /perform oral hygiene as appropriate       Goal: Nutritional intake is adequate  Outcome: Progressing    Problem: Pain interferes with ability to perform ADL  Goal: Pain at adequate level as identified by patient  Outcome: Progressing    11/24/15 0959   Goal/Interventions addressed this shift   Pain at adequate level as identified by patient Identify patient comfort function goal;Assess for risk of opioid induced respiratory depression, including snoring/sleep apnea. Alert healthcare team of risk factors identified.;Assess pain on admission, during daily assessment and/or before any "as needed" intervention(s);Reassess pain within 30-60 minutes of any procedure/intervention, per Pain Assessment, Intervention, Reassessment (AIR) Cycle;Include patient/patient care companion in decisions related to pain management as needed

## 2015-11-24 NOTE — Progress Notes (Signed)
Patient states she doesn't want the lovenox because she ambulates the halls and already has a filer in her leg.

## 2015-11-24 NOTE — Progress Notes (Signed)
11/24/15 1745   Case Management Quick Doc   Case Management Assessment Status Assessment Complete   CM Comments RNCM: 8/16: pancreatitis. etoh abuse. IVF's. diet increased to full liquids.  Declined ETOH cessation programs. d/c plan- return toh ome with roommmate.    Expected Discharge Date 11/25/15   Physical Discharge Disposition Home, No Needs       Shaquanna Lycan K. Pinner-Oveda Dadamo, RN BSN  Case Production designer, theatre/television/film   [facility]  [phone]

## 2015-11-24 NOTE — Progress Notes (Signed)
[facility]   [address]   [place] [address]     INITIAL ASSESSMENT  Case Management       Estimated D/C Date:     8/17   RX Coverage:       Yes. [employer].    Inpatient Plan of Care:      Admitted with pancreatitis. IVF's.  Diet increased to a full liquid diet.    CM Interventions:      Case reviewed. Met with the patient.  Independent. Plan is for patient the patient to return to home with her roommate on discharge. Denies d/c needs. Patient declined alcohol cessation program info.           11/24/15 1740   Patient Type   Within 30 Days of Previous Admission? No   Healthcare Decisions   Interviewed: Patient   Orientation/Decision Making Abilities of Patient Alert and Oriented x3, able to make decisions   Prior to admission   Prior level of function Independent with ADLs   Type of Residence Private residence   Home Layout Multi-level;Performs ADL's on one level   Have running water, electricity, heat, etc? Yes   Living Arrangements Other (Comment)  (roommate)   How do you get to your MD appointments? self   How do you get your groceries? self   Who fixes your meals? sefl   Who does your laundry? self   Who picks up your prescriptions? self   Dressing Independent   Grooming Independent   Feeding Independent   Bathing Independent   Toileting Independent   Home Care/Community Services None   Adult Protective Services (APS) involved? No   Discharge Planning   Support Systems Friends/neighbors  (roommate)   Patient expects to be discharged to: return to home.    Anticipated [place] plan discussed with: Same as interviewed   Mode of transportation: (patient plans to drive herself home. )   Consults/Providers   Correct PCP listed in Epic? Yes       Pharell Rolfson K. Pinner-Mattelyn Imhoff, RN BSN  Case Production designer, theatre/television/film   [facility]  [phone]

## 2015-11-25 LAB — BASIC METABOLIC PANEL
Anion Gap: 10.5 mMol/L (ref 7.0–18.0)
BUN / Creatinine Ratio: 3.8 Ratio — ABNORMAL LOW (ref 10.0–30.0)
BUN: 3 mg/dL — ABNORMAL LOW (ref 7–22)
CO2: 23.9 mMol/L (ref 20.0–30.0)
Calcium: 8.3 mg/dL — ABNORMAL LOW (ref 8.5–10.5)
Chloride: 105 mMol/L (ref 98–110)
Creatinine: 0.78 mg/dL (ref 0.60–1.20)
EGFR: 86 mL/min/{1.73_m2} (ref 60–150)
Glucose: 94 mg/dL (ref 70–99)
Osmolality Calc: 268 mOsm/kg — ABNORMAL LOW (ref 275–300)
Potassium: 3.4 mMol/L — ABNORMAL LOW (ref 3.5–5.3)
Sodium: 136 mMol/L (ref 136–147)

## 2015-11-25 LAB — CBC AND DIFFERENTIAL
Basophils %: 0.3 % (ref 0.0–3.0)
Basophils Absolute: 0 10*3/uL (ref 0.0–0.3)
Eosinophils %: 7.8 % — ABNORMAL HIGH (ref 0.0–7.0)
Eosinophils Absolute: 0.5 10*3/uL (ref 0.0–0.8)
Hematocrit: 34.9 % — ABNORMAL LOW (ref 36.0–48.0)
Hemoglobin: 11.7 gm/dL — ABNORMAL LOW (ref 12.0–16.0)
Lymphocytes Absolute: 1.5 10*3/uL (ref 0.6–5.1)
Lymphocytes: 24.5 % (ref 15.0–46.0)
MCH: 38 pg — ABNORMAL HIGH (ref 28–35)
MCHC: 33 gm/dL (ref 32–36)
MCV: 114 fL — ABNORMAL HIGH (ref 80–100)
MPV: 5.9 fL — ABNORMAL LOW (ref 6.0–10.0)
Monocytes Absolute: 0.7 10*3/uL (ref 0.1–1.7)
Monocytes: 10.5 % (ref 3.0–15.0)
Neutrophils %: 56.8 % (ref 42.0–78.0)
Neutrophils Absolute: 3.5 10*3/uL (ref 1.7–8.6)
PLT CT: 403 10*3/uL (ref 130–440)
RBC: 3.05 10*6/uL — ABNORMAL LOW (ref 3.80–5.00)
RDW: 13.6 % (ref 11.0–14.0)
WBC: 6.2 10*3/uL (ref 4.0–11.0)

## 2015-11-25 LAB — LIPASE: Lipase: 72 U/L (ref 8–78)

## 2015-11-25 MED ORDER — PANCRELIPASE (LIP-PROT-AMYL) 12000-38000 UNITS PO CPEP
2.0000 | ORAL_CAPSULE | Freq: Three times a day (TID) | ORAL | Status: DC
Start: 2015-11-25 — End: 2015-11-27
  Administered 2015-11-25 – 2015-11-27 (×7): 24000 [IU] via ORAL
  Filled 2015-11-25 (×8): qty 2

## 2015-11-25 MED ORDER — POTASSIUM CHLORIDE CRYS ER 20 MEQ PO TBCR
40.0000 meq | EXTENDED_RELEASE_TABLET | Freq: Once | ORAL | Status: AC
Start: 2015-11-25 — End: 2015-11-25
  Administered 2015-11-25: 40 meq via ORAL
  Filled 2015-11-25: qty 2

## 2015-11-25 NOTE — Progress Notes (Signed)
11/25/15 1713   Case Management Quick Doc   Case Management Assessment Status Assessment Complete   CM Comments RNCM: 8/17: pancreatitis. etoh abuse. IVF's d/c'ed.full liquid diet. per MD plan transition to oral pain meds in the next 24 hrs. Independent. patient declined ETOH cessation program info. d/c plan - return to home with roommate.    Expected Discharge Date 11/26/15       Presley Summerlin K. Pinner-Kylle Lall, RN BSN  Case Production designer, theatre/television/film   [facility]  [phone]

## 2015-11-25 NOTE — Plan of Care (Signed)
Problem: Altered GI Function  Goal: Nutritional intake is adequate  Outcome: Progressing  0941 Pt tolerating liquid diet. Requested pain and nausea medication this morning. Pain located in LUQ. Resolved with medication.

## 2015-11-25 NOTE — Progress Notes (Signed)
Pt rested quietly through the shift, no n/v;  Reports good pain control; tolerating sips of clear liquids;   B/P noted to be low mid shift- (noted to be 2 hours post B/P med); WNL on repeat check.  Pt denies needs at this time; up ad lib;

## 2015-11-25 NOTE — Progress Notes (Signed)
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 Frequent recurrent episodes of pancreatitis since then    AKI resolved  1.39 improved to 0.8 with IVF  Will cont to monitor    Alcohol abuse  Last drank last week    Hypertension  Continuing home BP meds w/ holding parameters.   GERD  Continuing PPI.   Tobacco use  Encourage cessation  Asthma with no exacerbation  Cont bronchodilators    Nutrition: Full liquid diet  DVT Prophylaxis: low molecular weight heparin and SCD's while in bed  Code Status: do not resuscitate   DNR with bipap no pressors       Time spent with patient 35  Labs have been ordered for the morning  Still requiring IV Dilaudid  I have changed nursing orders  Labs and other studies were personally reviewed  MEDICATION changes were made and/or added  Case was discussed with RN      I have spent 35 with the patient discussing the principle problem (listed above), the active hospital problems (listed above), the results of laboratory tests and medications (current therapy and side effects). Greater than 50% of the time was spent counseling the patient and coordinating care with Nursing staff (RN/LPN).                      Subjective                                                                                      [facility]     CC: Abdominal pain    states her abdominal pain has improved.   He had pain after she ate yesterday so she switched back to clear liquids this morning and states she would like to try again  today. She is requiring less pain medications.      Review of Systems:    Constitutional:  Denies fever or fatigue.    ENT:  Denies sore throat or trouble swallowing.    CV:  Denies chest pain or palpitations.    Respiratory:  Denies SOB or cough.   GI- see above  GU:  Denies pain or burning with urination.    MS:  Denies new muscle or joint pain.    Skin:  Denies rash.    Heme: Denies any bleeding.    Physical Exam:                                                                              [facility]     Patient Vitals for the past 24 hrs:   BP Temp Temp src Pulse Resp SpO2 Weight   11/25/15 0825 133/83 mmHg 98.6 F (37 C) Oral 64 14 90 % -   11/25/15 0516 - - - - - - 87 kg (191 lb 12.8 oz)   11/25/15 0135 105/70 mmHg - - 60 - 95 % -   11/24/15 2340 92/49 mmHg 97.9 F (36.6 C) Oral 63 16 93 % -   11/24/15 1931 117/76 mmHg 98.2 F (36.8 C) Oral 73 12 92 % -        Intake/Output Summary (Last 24 hours) at 11/25/15 1523  Last data filed at 11/25/15 1443   Gross per 24 hour   Intake    840 ml   Output    300 ml   Net    540 ml     Wt Readings from Last 1 Encounters:   11/25/15 87 kg (191 lb 12.8 oz)        Constitutional: Well groomed and in no acute distress.    Psych: Alert and oriented X3. Affect is not depressed/anxious.    Eyes: Conjunctiva is pink without purulent drainage.    ENT: Hearing is grossly normal. Mucosa is moist.    CV: Heart shows regular rate and rhythm. Pedal pulses are 2+. Pedal edema absent.    Lungs: Clear to ascultation. No accessory muscle use.    Abd: No masses. No tenderness. Bowel sounds present. No organomegaly.    MS: She is able to ambulate.     Skin: No rash.  No nodules.      Meds:                                                                                              [facility]     Estimated Creatinine Clearance: 90.6 mL/min (based on Cr of 0.78).    Current Facility-Administered Medications   Medication Dose Route Frequency   . enoxaparin  40 mg Subcutaneous Q24H   . labetalol  200 mg Oral BID   .  multivitamin  1 tablet Oral QAM   . pancrelipase (Lip-Prot-Amyl)  2 capsule Oral TID MEALS   . pantoprazole  40 mg Oral Daily       PRN medications: acetaminophen **OR** acetaminophen **OR** acetaminophen, acetaminophen **OR** acetaminophen, HYDROcodone-acetaminophen, HYDROmorphone, ondansetron **OR** ondansetron    IV Drips:    . lactated ringers 75 mL/hr at 11/24/15 1735       Labs and Imaging:                                                                        [facility]     RECENT LABS (from the last 7 days)    Recent Labs  Lab 11/25/15  0534 11/24/15  1220 11/24/15  0403   WBC 6.2  --  7.5   HEMOGLOBIN 11.7* 12.1 11.5*   HEMATOCRIT 34.9* 36.3 35.0*   PLT CT 403  --  398                  Recent Labs  Lab 11/25/15  0534 11/24/15  1220   GLUCOSE 94 88   SODIUM 136 138   POTASSIUM 3.4* 3.3*   CHLORIDE 105 105   CO2 23.9 24.1   BUN 3* 5*   CREATININE 0.78 0.80   EGFR 86 83   CALCIUM 8.3* 8.8           Recent Labs  Lab 11/23/15  1134   ALBUMIN 3.0*   PROTEIN, TOTAL 7.4   BILIRUBIN, TOTAL 0.6   ALKALINE PHOSPHATASE 147*   ALT 15   AST (SGOT) 33          Invalid input(s):  AMORPHOUSUA   Lab Results   Component Value Date    HGBA1CPERCNT 5.3 04/19/2012        Imaging, reviewed and are significant for:  CT Abdomen Pelvis with IV Cont   Final Result      1. Redemonstration of marked pancreatic ductal dilatation, somewhat more prominent than the prior examination, with superimposed changes of acute pancreatitis. There is also irregular wall thickening of the antrum of the stomach and first portion of the    duodenum which may be reactive. Follow-up imaging  to document resolution is recommended.      2. Again noted are multifocal cystic irregularities in the liver, mostly within segment 5 and 8 possibly related to intrahepatic biliary ductal dilatation. An MRI of the abdomen with and without contrast can be performed for further characterization.      3. 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         ReadingStation:[id number]          Microbiology Results     None          Donnie Coffin, DO  [facility], [place]  [address]  [place], [id number]  Pager [phone]          This note was generated within an electronic medical record, and portions of it may have been completed with voice recognition software. Typographical, word substitution, pronoun and other language errors may occur. If there are substantial concerns about the content of this note that may affect patient care, please contact  the author for clarification.

## 2015-11-25 NOTE — Plan of Care (Signed)
Patient has c/o back pain 7/10. Requesting pain medication. Notified primary RN, and Dilaudid IV given as ordered. VSS. Pt denies further needs at this time.

## 2015-11-26 LAB — BASIC METABOLIC PANEL
Anion Gap: 12.9 mMol/L (ref 7.0–18.0)
BUN / Creatinine Ratio: 2.7 Ratio — ABNORMAL LOW (ref 10.0–30.0)
BUN: 2 mg/dL — ABNORMAL LOW (ref 7–22)
CO2: 20.8 mMol/L (ref 20.0–30.0)
Calcium: 8.8 mg/dL (ref 8.5–10.5)
Chloride: 108 mMol/L (ref 98–110)
Creatinine: 0.73 mg/dL (ref 0.60–1.20)
EGFR: 93 mL/min/{1.73_m2} (ref 60–150)
Glucose: 101 mg/dL — ABNORMAL HIGH (ref 70–99)
Osmolality Calc: 272 mOsm/kg — ABNORMAL LOW (ref 275–300)
Potassium: 3.7 mMol/L (ref 3.5–5.3)
Sodium: 138 mMol/L (ref 136–147)

## 2015-11-26 LAB — CBC AND DIFFERENTIAL
Basophils %: 1.3 % (ref 0.0–3.0)
Basophils Absolute: 0.1 10*3/uL (ref 0.0–0.3)
Eosinophils %: 6.6 % (ref 0.0–7.0)
Eosinophils Absolute: 0.4 10*3/uL (ref 0.0–0.8)
Hematocrit: 36.1 % (ref 36.0–48.0)
Hemoglobin: 11.6 gm/dL — ABNORMAL LOW (ref 12.0–16.0)
Lymphocytes Absolute: 1.4 10*3/uL (ref 0.6–5.1)
Lymphocytes: 22.5 % (ref 15.0–46.0)
MCH: 37 pg — ABNORMAL HIGH (ref 28–35)
MCHC: 32 gm/dL (ref 32–36)
MCV: 117 fL — ABNORMAL HIGH (ref 80–100)
MPV: 6.2 fL (ref 6.0–10.0)
Monocytes Absolute: 0.6 10*3/uL (ref 0.1–1.7)
Monocytes: 9.9 % (ref 3.0–15.0)
Neutrophils %: 59.6 % (ref 42.0–78.0)
Neutrophils Absolute: 3.7 10*3/uL (ref 1.7–8.6)
PLT CT: 410 10*3/uL (ref 130–440)
RBC: 3.1 10*6/uL — ABNORMAL LOW (ref 3.80–5.00)
RDW: 14 % (ref 11.0–14.0)
WBC: 6.2 10*3/uL (ref 4.0–11.0)

## 2015-11-26 LAB — LIPASE: Lipase: 319 U/L — ABNORMAL HIGH (ref 8–78)

## 2015-11-26 NOTE — Plan of Care (Signed)
Problem: Altered GI Function  Goal: Fluid and electrolyte balance are achieved/maintained  Outcome: Progressing    11/26/15 0557   Goal/Interventions addressed this shift   Fluid and electrolyte balance are achieved/maintained Monitor intake and output every shift;Monitor/assess lab values and report abnormal values;Provide adequate hydration;Assess for confusion/personality changes         Problem: Side Effects from Pain Analgesia  Goal: Patient will experience minimal side effects of analgesic therapy  Outcome: Progressing    11/26/15 0557   Goal/Interventions addressed this shift   Patient will experience minimal side effects of analgesic therapy Monitor/assess patient's respiratory status (RR depth, effort, breath sounds);Assess for changes in cognitive function         Comments:   Pt vital signs stable. Medicated for pain as needed. No s/s of distress.

## 2015-11-26 NOTE — Progress Notes (Signed)
Pt medicated for pain throughout shift. Vital signs stable. No s/s of distress. Call bell within reach. Will continue to monitor.

## 2015-11-26 NOTE — Progress Notes (Signed)
11/26/15 1542   CM Review   Case Management Assessment Status Assessment Complete   Expected Discharge Date 11/28/15   CM Comments 11/26/15 RNCM: admitted with pancreatitis in patient with chronic alcoholic pancreatitis. Lipase 602 on admission, trended down to 72, and this am up to 319. Full liquid diet, started on Creon, pain management, po dilaudid, daily weight. Pt does not think she will have needs at discharge. Anticipate discharge home with roommate and no needs.        Marge Duncans RN, BSN/Case Manager  [phone]

## 2015-11-26 NOTE — Progress Notes (Signed)
Patient sitting up in bed, no s/s of distress. Cal bell within reach, denies any needs at this time. Will continue to monitor.

## 2015-11-26 NOTE — Progress Notes (Signed)
PROGRESS NOTE - [facility]    Date Time: 11/26/2015 3:09 PM  Patient Name: [name]  Attending Physician: Herb Grays  MRN: [id number]       DOB: Jan 05, 1961      Assessment and Plan:                                                                                                 [facility]        Active Problems:    Pancreatitis    Chronic alcoholic pancreatitis    Acute on chronic pancreatitis    History of partial pancreatectomy    Acute on chronic Pancreatitis recurrent secondary to alcohol  improved  Cont NEW Creon  Tolerating norco   full liquid diet  CT shows pancreatits  Lipase is decreased  Has history of cholecystectomy and 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 Frequent recurrent episodes of pancreatitis since then    Needs f/u with GI outpatient.  Put on [place]  Discussed dietary changes    AKI resolved  1.39 improved to 0.8 with IVF  Will cont to monitor    Alcohol abuse  Last drank last week    Hypertension  Continuing home BP meds w/ holding parameters.   GERD  Continuing PPI.   Tobacco use  Encourage cessation  Asthma with no exacerbation  Cont bronchodilators    Nutrition: Full liquid diet  DVT Prophylaxis: low molecular weight heparin and SCD's while in bed  Code Status: do not resuscitate   DNR with bipap no pressors       Time spent with patient 35  Labs have been ordered for the morning  I have changed nursing orders  Labs and other studies were personally reviewed  MEDICATION changes were made and/or added  Case was discussed with RN    I have spent 35 with the patient discussing the principle problem (listed above), the active hospital problems (listed above), the results of laboratory tests and medications (current therapy and side effects). Greater than 50% of the time was spent counseling the patient and coordinating care with Nursing staff (RN/LPN).                     Subjective                                                                                       [facility]     CC: Abdominal pain    states her abdominal pain has improved.   Patient tolerating full liquid diet      Review of Systems:    Constitutional:  Denies fever or fatigue.    ENT:  Denies sore throat or trouble swallowing.  CV:  Denies chest pain or palpitations.    Respiratory:  Denies SOB or cough.   GI- see above  GU:  Denies pain or burning with urination.    MS:  Denies new muscle or joint pain.    Skin:  Denies rash.    Heme: Denies any bleeding.    Physical Exam:                                                                              [facility]     Patient Vitals for the past 24 hrs:   BP Temp Temp src Pulse Resp SpO2 Weight   11/26/15 0823 132/77 mmHg 98.3 F (36.8 C) Oral 63 17 - -   11/26/15 0435 - - - - - - 87.408 kg (192 lb 11.2 oz)   11/25/15 2209 116/73 mmHg 98.3 F (36.8 C) Oral 67 16 94 % -   11/25/15 2036 123/75 mmHg - - 66 - - -   11/25/15 1731 96/61 mmHg 99.3 F (37.4 C) Oral 67 17 95 % -   11/25/15 1528 100/58 mmHg 98.2 F (36.8 C) Oral 66 16 92 % -        Intake/Output Summary (Last 24 hours) at 11/26/15 1509  Last data filed at 11/26/15 1357   Gross per 24 hour   Intake    900 ml   Output      0 ml   Net    900 ml     Wt Readings from Last 1 Encounters:   11/26/15 87.408 kg (192 lb 11.2 oz)        Constitutional: Well groomed and in no acute distress.    Psych: Alert and oriented X3. Affect is not depressed/anxious.    Eyes: Conjunctiva is pink without purulent drainage.    ENT: Hearing is grossly normal. Mucosa is moist.    CV: Heart shows regular rate and rhythm. Pedal pulses are 2+. Pedal edema absent.    Lungs: Clear to ascultation. No accessory muscle use.    Abd: No masses. No tenderness. Bowel sounds present. No organomegaly.    MS: She is able to ambulate.     Skin: No rash. No nodules.      Meds:                                                                                               [facility]     Estimated Creatinine Clearance: 96.9 mL/min (based on Cr of 0.73).    Current Facility-Administered Medications   Medication Dose Route Frequency   . enoxaparin  40 mg Subcutaneous Q24H   . labetalol  200 mg Oral BID   . multivitamin  1 tablet Oral QAM   . pancrelipase (Lip-Prot-Amyl)  2 capsule Oral TID MEALS   .  pantoprazole  40 mg Oral Daily       PRN medications: acetaminophen **OR** acetaminophen **OR** acetaminophen, acetaminophen **OR** acetaminophen, HYDROcodone-acetaminophen, HYDROmorphone, ondansetron **OR** ondansetron    IV Drips:         Labs and Imaging:                                                                        [facility]     RECENT LABS (from the last 7 days)    Recent Labs  Lab 11/26/15  0617 11/25/15  0534   WBC 6.2 6.2   HEMOGLOBIN 11.6* 11.7*   HEMATOCRIT 36.1 34.9*   PLT CT 410 403                  Recent Labs  Lab 11/26/15  0617 11/25/15  0534   GLUCOSE 101* 94   SODIUM 138 136   POTASSIUM 3.7 3.4*   CHLORIDE 108 105   CO2 20.8 23.9   BUN 2* 3*   CREATININE 0.73 0.78   EGFR 93 86   CALCIUM 8.8 8.3*           Recent Labs  Lab 11/23/15  1134   ALBUMIN 3.0*   PROTEIN, TOTAL 7.4   BILIRUBIN, TOTAL 0.6   ALKALINE PHOSPHATASE 147*   ALT 15   AST (SGOT) 33          Invalid input(s):  AMORPHOUSUA   Lab Results   Component Value Date    HGBA1CPERCNT 5.3 04/19/2012        Imaging, reviewed and are significant for:  CT Abdomen Pelvis with IV Cont   Final Result      1. Redemonstration of marked pancreatic ductal dilatation, somewhat more prominent than the prior examination, with superimposed changes of acute pancreatitis. There is also irregular wall thickening of the antrum of the stomach and first portion of the    duodenum which may be reactive. Follow-up imaging to document resolution is recommended.      2. Again noted are multifocal cystic irregularities in the liver, mostly within segment 5 and 8 possibly related to intrahepatic biliary ductal  dilatation. An MRI of the abdomen with and without contrast can be performed for further characterization.      3. 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         ReadingStation:[id number]          Microbiology Results     None          Donnie Coffin, DO  [facility], [place]  [address]  [place], [id number]  Pager [phone]          This note was generated within an electronic medical record, and portions of it may have been completed with voice recognition software. Typographical, word substitution, pronoun and other language errors may occur. If there are substantial concerns about the content of this note that may affect patient care, please contact the author for clarification.

## 2015-11-26 NOTE — Plan of Care (Addendum)
Problem: Altered GI Function  Goal: Nutritional intake is adequate  Outcome: Progressing    11/26/15 1521   Goal/Interventions addressed this shift   Nutritional intake is adequate Assess anorexia, appetite, and amount of meal/food tolerated   Monitored for abdominal pain or n/v after meals. Patient reports mild 3/10 abdominal pain after eating, declines pain intervention.     Monitored tolerance to full liquid diet. Patient reports no n/v.     Comments:   Patient on full liquid diet and tolerating diet fairly well. Patient reports having abdominal pain after eating the chicken or beef broth. Encouraged patient to avoid broth for now, patient eating jello and italian ice. Patient reports 3/10 abdominal pain, declines pain intervention. Patient reports norco is effective for pain level 5/10. Lipase increased from 72 to 319 today, continue to monitor improvement in lipase level.

## 2015-11-26 NOTE — Plan of Care (Signed)
Problem: Altered GI Function  Goal: Nutritional intake is adequate  Outcome: Progressing    11/26/15 0551   Goal/Interventions addressed this shift   Nutritional intake is adequate Assist patient with meals/food selection;Allow adequate time for meals;Encourage/perform oral hygiene as appropriate;Include patient/patient care companion in decisions related to nutrition;Assess anorexia, appetite, and amount of meal/food tolerated         Problem: Pain interferes with ability to perform ADL  Goal: Pain at adequate level as identified by patient  Outcome: Progressing    11/26/15 0551   Goal/Interventions addressed this shift   Pain at adequate level as identified by patient Identify patient comfort function goal;Assess for risk of opioid induced respiratory depression, including snoring/sleep apnea. Alert healthcare team of risk factors identified.;Assess pain on admission, during daily assessment and/or before any "as needed" intervention(s);Reassess pain within 30-60 minutes of any procedure/intervention, per Pain Assessment, Intervention, Reassessment (AIR) Cycle;Evaluate if patient comfort function goal is met;Evaluate patient's satisfaction with pain management progress;Offer non-pharmacological pain management interventions         Comments:   Pt on full liquid diet. Pt medicated per [facility] for pain.

## 2015-11-26 NOTE — Plan of Care (Signed)
Problem: Altered GI Function  Goal: Fluid and electrolyte balance are achieved/maintained  Outcome: Completed Date Met:  11/26/15    11/26/15 1520   Goal/Interventions addressed this shift   Fluid and electrolyte balance are achieved/maintained Monitor intake and output every shift   Patient drinking plenty of fluids orally without n/v.     Monitored labs. Electrolytes WNL.

## 2015-11-26 NOTE — Progress Notes (Signed)
Assumed care of pt at 1900. Pt resting in bed. Assessment complete. No s/s of distress. No concerns verbalized at this time. Call bell within reach, bed in lowest position. Medications given per MAR. Will continue to monitor.

## 2015-11-27 MED ORDER — HYDROCODONE-ACETAMINOPHEN 5-325 MG PO TABS
1.0000 | ORAL_TABLET | Freq: Four times a day (QID) | ORAL | Status: DC | PRN
Start: 2015-11-27 — End: 2017-07-26

## 2015-11-27 MED ORDER — PANCRELIPASE (LIP-PROT-AMYL) 12000-38000 UNITS PO CPEP
24000.0000 [IU] | ORAL_CAPSULE | Freq: Three times a day (TID) | ORAL | Status: DC
Start: 2015-11-27 — End: 2018-08-05

## 2015-11-27 NOTE — Plan of Care (Signed)
Problem: Altered GI Function  Goal: Nutritional intake is adequate  Outcome: Progressing    11/27/15 0141   Goal/Interventions addressed this shift   Nutritional intake is adequate Monitor daily weights;Assist patient with meals/food selection;Allow adequate time for meals;Encourage/perform oral hygiene as appropriate;Include patient/patient care companion in decisions related to nutrition;Assess anorexia, appetite, and amount of meal/food tolerated         Problem: Pain interferes with ability to perform ADL  Goal: Pain at adequate level as identified by patient  Outcome: Progressing    11/27/15 0141   Goal/Interventions addressed this shift   Pain at adequate level as identified by patient Identify patient comfort function goal;Assess for risk of opioid induced respiratory depression, including snoring/sleep apnea. Alert healthcare team of risk factors identified.;Assess pain on admission, during daily assessment and/or before any "as needed" intervention(s);Reassess pain within 30-60 minutes of any procedure/intervention, per Pain Assessment, Intervention, Reassessment (AIR) Cycle;Evaluate if patient comfort function goal is met;Evaluate patient's satisfaction with pain management progress;Offer non-pharmacological pain management interventions         Comments:   Pt consumed 50% of lunch. Refused breakfast. Pt tolerating liquids well. States "I found that soup does not sit well with me, but crackers are ok." Daily weights monitored. Pt rated pain 0/10. Pt resting in bed. No s/s of distress and no concerns verbalized at this time. Call bell within reach. Will continue to monitor.   '

## 2015-11-27 NOTE — Discharge Summary (Signed)
DISCHARGE SUMMARY    Patient Name: [name]  Attending Physician: Kerin Salen, MD  Primary Care Physician: Cain Saupe, MD  Date of Admission: 11/23/2015  Date of Discharge: 11/27/2015    Discharge Diagnoses:                                                                        [name]     Patient Active Problem List    Diagnosis Date Noted   . Chronic alcoholic pancreatitis 11/24/2015   . Acute on chronic pancreatitis 11/24/2015   . 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 Pancreatitis 11/23/2015   . ETOH abuse 07/03/2014   . Asthma 07/03/2014   . Tobacco use 04/24/2014   . Essential hypertension 04/23/2014   . History of pancreatitis 01/21/2011      Consultations:                                                                                      [name], [place]   Treatment Team: Attending Provider: Kerin Salen, MD; Consulting Physician: Donnie Coffin, DO; Case Manager: Fleet Contras, RN; Technician: Georges Mouse, CNA; Registered Nurse: Junious Dresser, RN; Registered Nurse: Prudence Davidson, RN  Procedures/Radiology performed:                                                   [name]   CT ABDOMEN PELVIS W IV/ WO PO CONT    [name] Course:                                                                                  [name]   This 55 year old female who presented to the [name] on August 15 with epigastric pain. Her lipase was noted to be 602.The patient did admit to having some alcohol about a week prior.CT abdomen  Demonstrated marked pancreatic ductal dilation with superimposed acute pancreatitis.The patient was placed on fluids, and pain control was provided. She was made nothing by mouth for approximately 24 hours,, and then her feeds were advanced. Lipase improved. The patient seems to be tolerating her full liquid diet. The patient is requesting discharge home today. The patient's creatinine has improved with fluids.  Creon Has been added, and close follow-up with gastroenterology as recommended.  Discharge Day Exam:  Temp:  [97.5 F (36.4 C)-98.8 F (37.1 C)] 97.5 F (36.4 C)  Heart Rate:  [63-75]  69  Resp Rate:  [16-18] 16  BP: (102-137)/(64-91) 137/84 mmHg  Body mass index is 30.75 kg/(m^2).  alert  normocephalic  sclera anicteric  supple, no accessory muscle use  regular rate and rhythm. no rub  bilateral breath sounds, clear to auscultation, no wheezes, no rales, no rhonchi  soft, nontender, nondistended, bowel sounds positive, no abdominal paradox with respiration  no cyanosis, no cords  Discharge Medications:                                                                      [name]        Discharge Medication List      Taking          amLODIPine 10 MG tablet   Dose:  10 mg   Commonly known as:  NORVASC   Take 10 mg by mouth every morning.       HYDROcodone-acetaminophen 5-325 MG per tablet   Dose:  1 tablet   Commonly known as:  NORCO   Take 1 tablet by mouth every 6 (six) hours as needed for Pain.       ibuprofen 200 MG tablet   Dose:  400 mg   Commonly known as:  ADVIL,MOTRIN   Take 400 mg by mouth every 6 (six) hours as needed for Pain.       labetalol 100 MG tablet   Dose:  200 mg   Commonly known as:  NORMODYNE   Take 200 mg by mouth 2 (two) times daily. Take two 100 mg tablets for a total dose of 200 mg every 12 hours       multivitamin with minerals tablet   Dose:  1 tablet   Take 1 tablet by mouth every morning.       pancrelipase (Lip-Prot-Amyl) 12000 units Cpep capsule   Dose:  24000 units of lipase   Commonly known as:  CREON 12000   Take 2 capsules (24,000 units of lipase total) by mouth 3 (three) times daily with meals.       pantoprazole 40 MG tablet   Dose:  40 mg   Commonly known as:  PROTONIX   Take 40 mg by mouth every morning.       potassium chloride 20 MEQ tablet   Dose:  20 mEq   Commonly known as:  K-DUR,KLOR-CON   Take 1 tablet (20 mEq total) by mouth 2 (two) times daily.        traMADol 50 MG tablet   Dose:  50-100 mg   Commonly known as:  ULTRAM   Take 1-2 tablets (50-100 mg total) by mouth every 8 (eight) hours as needed.           Discharge Instructions:                                                                     [facility]      Diet: advance diet slowly as tolerated, no alcohol, no spicy food.  Activity/Weight Bearing Status: as tolerated    Patient was instructed to follow up with:  [facility]  Dr. Mel Almond    Discharge Code Status: No CPR - support ok    Complete instructions and follow up are in the patient's After Visit Summary (AVS).    Discharge Condition:                                                                          [facility]   The patient was discharged in stable condition.  Time spent coordinating discharge and reviewing discharge plan:  41 minutes    Signed by: Kerin Salen, MD  [name]  [address]  [place], [place]    CC: Cain Saupe, MD

## 2015-11-27 NOTE — Discharge Instructions (Signed)
Discharge Instructions for Acute Pancreatitis  You have been diagnosed with acute pancreatitis. Your pancreas is inflamed or swollen. The pancreas is an organ that makes digestive juices and hormones. Gallstones are a common cause of pancreatitis. These hard stones form in the gallbladder. The gallbladder shares a tube with the pancreas into the small intestine. If gallstones block this tube, fluid can't leave the pancreas. The fluid backs up and causes redness and swelling (inflammation).There are other causes of pancreatitis. Make sure you understand the cause of your pancreatitis. Then you can try to stop it from happening again.  Immediate home care   Find someone to drive you to appointments. Acute pancreatitis is a serious condition, and you should never drive if you are experiencing symptoms.   Stop drinking if your illness was caused by alcohol.   Ask your healthcare provider about alcohol abuse programs and support groups such as Alcoholics Anonymous.   Ask your provider about prescription medicines that can help you stop drinking.   Tell your provider about the alcohol withdrawal symptoms you have when you stop drinking. This is very important. You may need close medical supervision and special medicineswhen you stop drinking. This will depend on your alcohol withdrawal history.   Take your medicines exactly as directed. Don't skip doses.   Eat a low-fat diet. Ask your provider for menus and other diet information.   Learn to take your own pulse. Keep a record of your results. Ask your provider which readings mean that you need medical attention.  Ongoingcare   Tell your provider about any medicines you are taking. Some medicines can cause this condition.   Before starting any new medicine, ask your provider if it will harm your pancreas. This includes any new over-the-counter medicines, vitamins, or herbal supplements.    Tell your provider if you lose weight without dieting.   Be aware  of symptoms that may mean your pancreatitis has come back. These symptoms include belly pain, nausea and vomiting, and fever.   Keep all follow-up appointments with your provider. Problems can often show up later.  Follow-up  Follow up with your healthcare provider, or as advised.  When to call your provider  Call your healthcare provider right away if you have any of the following:   Feverof100.4F(38.0C) or higher, or as advised by your provider   Severe pain from your upper belly to your back   Nausea and vomiting   Feely dizzy or lightheaded   Yellowing of your skin or eyes (jaundice)   Bruises on your belly or back   Belly swelling and tenderness   Rapid pulse   Shallow, fast breathing   Date Last Reviewed: 11/09/2014   2000-2016 [employer] [address], [place] [address]. All rights reserved. This information is not intended as a substitute for professional medical care. Always follow your healthcare professional's instructions.

## 2015-11-27 NOTE — Progress Notes (Signed)
Pt has slept throughout the shift. No s/s of distress. Cal bell within reach. Will continue to monitor.

## 2015-11-29 NOTE — UM Notes (Signed)
Discharge summary for [name]  04/25/1960  Auth# [id number]      DISCHARGE SUMMARY    Patient Name: [name]  Attending Physician: Kerin Salen, MD  Primary Care Physician: Cain Saupe, MD  Date of Admission: 11/23/2015  Date of Discharge: 11/27/2015    Discharge Diagnoses: [facility]     Patient Active Problem List    Diagnosis Date Noted   . Chronic alcoholic pancreatitis 11/24/2015   . Acute on chronic pancreatitis 11/24/2015   . 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 Pancreatitis 11/23/2015   . ETOH abuse 07/03/2014   . Asthma 07/03/2014   . Tobacco use 04/24/2014   . Essential hypertension 04/23/2014   . History of pancreatitis 01/21/2011      Consultations: [facility]   Treatment Team: Attending Provider: Kerin Salen, MD; Consulting Physician: Donnie Coffin, DO; Case Manager: Fleet Contras, RN; Technician: Georges Mouse, CNA; Registered Nurse: Junious Dresser, RN; Registered Nurse: Prudence Davidson, RN  Procedures/Radiology performed: [facility]   CT ABDOMEN PELVIS W IV/ WO PO CONT   Hospital Course: [facility]   This 55 year old female who presented to the hospital on August 15 with epigastric pain. Her lipase was noted to be 602.The patient did admit to having some alcohol about a week prior.CT abdomen Demonstrated marked pancreatic ductal dilation with superimposed acute pancreatitis.The patient was placed on fluids, and pain control was provided. She was made nothing by mouth for approximately 24 hours,, and then her feeds were advanced. Lipase improved. The patient seems to be tolerating her full  liquid diet. The patient is requesting discharge home today. The patient's creatinine has improved with fluids. Creon Has been added, and close follow-up with gastroenterology as recommended.  Discharge Day Exam:  Temp: [97.5 F (36.4 C)-98.8 F (37.1 C)] 97.5 F (36.4 C)  Heart Rate: [63-75] 69  Resp Rate: [16-18] 16  BP: (102-137)/(64-91) 137/84 mmHg  Body mass index is 30.75 kg/(m^2).  alert  normocephalic  sclera anicteric  supple, no accessory muscle use  regular rate and rhythm. no rub  bilateral breath sounds, clear to auscultation, no wheezes, no rales, no rhonchi  soft, nontender, nondistended, bowel sounds positive, no abdominal paradox with respiration  no cyanosis, no cords  Discharge Medications: [facility]        Discharge Medication List      Taking         amLODIPine 10 MG tablet   Dose: 10 mg   Commonly known as: NORVASC   Take 10 mg by mouth every morning.       HYDROcodone-acetaminophen 5-325 MG per tablet   Dose: 1 tablet   Commonly known as: NORCO   Take 1 tablet by mouth every 6 (six) hours as needed for Pain.       ibuprofen 200 MG tablet   Dose: 400 mg   Commonly known as: ADVIL,MOTRIN   Take 400 mg by mouth every 6 (six) hours as needed for Pain.       labetalol 100 MG tablet   Dose: 200 mg   Commonly known as: NORMODYNE   Take 200 mg by mouth 2 (two) times daily. Take two 100 mg tablets for a total dose of 200 mg every 12 hours       multivitamin with minerals tablet   Dose: 1 tablet   Take 1 tablet by mouth every morning.       pancrelipase (Lip-Prot-Amyl) 12000 units Cpep  capsule   Dose: 24000 units of lipase   Commonly known as: CREON 12000   Take 2 capsules (24,000 units of lipase total) by mouth 3 (three) times daily with meals.       pantoprazole 40 MG tablet   Dose: 40 mg   Commonly known as: PROTONIX   Take 40 mg by mouth every morning.        potassium chloride 20 MEQ tablet   Dose: 20 mEq   Commonly known as: K-DUR,KLOR-CON   Take 1 tablet (20 mEq total) by mouth 2 (two) times daily.       traMADol 50 MG tablet   Dose: 50-100 mg   Commonly known as: ULTRAM   Take 1-2 tablets (50-100 mg total) by mouth every 8 (eight) hours as needed.           Discharge Instructions: [facility]      Diet: advance diet slowly as tolerated, no alcohol, no spicy food.    Activity/Weight Bearing Status: as tolerated    Patient was instructed to follow up with:  [facility]  Dr. Mel Almond    Discharge Code Status: No CPR - support ok    Complete instructions and follow up are in the patient's After Visit Summary (AVS).    Discharge Condition: [facility]   The patient was discharged in stable condition.  Time spent coordinating discharge and reviewing discharge plan: 41 minutes    Signed by: Kerin Salen, MD  [facility]  [address]  [place], [place]    CC: Cain Saupe, MD             Alvin Critchley RN  [facility]  Utilization Review  P [phone]  F [phone]

## 2016-04-10 HISTORY — PX: ERCP: SHX60

## 2016-04-21 ENCOUNTER — Emergency Department (HOSPITAL_COMMUNITY): Payer: Managed Care, Other (non HMO)

## 2016-04-21 ENCOUNTER — Inpatient Hospital Stay (HOSPITAL_COMMUNITY)
Admission: EM | Admit: 2016-04-21 | Discharge: 2016-04-27 | DRG: 193 | Disposition: A | Discharge status: Home / Self Care | Payer: Managed Care, Other (non HMO) | Attending: Internal Medicine | Admitting: Internal Medicine

## 2016-04-21 ENCOUNTER — Encounter (HOSPITAL_COMMUNITY): Payer: Self-pay | Admitting: *Deleted

## 2016-04-21 DIAGNOSIS — R0781 Pleurodynia: Secondary | ICD-10-CM | POA: Diagnosis not present

## 2016-04-21 DIAGNOSIS — J45909 Unspecified asthma, uncomplicated: Secondary | ICD-10-CM | POA: Diagnosis present

## 2016-04-21 DIAGNOSIS — J189 Pneumonia, unspecified organism: Secondary | ICD-10-CM | POA: Diagnosis not present

## 2016-04-21 DIAGNOSIS — F17210 Nicotine dependence, cigarettes, uncomplicated: Secondary | ICD-10-CM

## 2016-04-21 DIAGNOSIS — J9601 Acute respiratory failure with hypoxia: Secondary | ICD-10-CM

## 2016-04-21 DIAGNOSIS — J440 Chronic obstructive pulmonary disease with acute lower respiratory infection: Secondary | ICD-10-CM | POA: Diagnosis present

## 2016-04-21 DIAGNOSIS — J181 Lobar pneumonia, unspecified organism: Secondary | ICD-10-CM

## 2016-04-21 DIAGNOSIS — K861 Other chronic pancreatitis: Secondary | ICD-10-CM | POA: Diagnosis not present

## 2016-04-21 DIAGNOSIS — Z90411 Acquired partial absence of pancreas: Secondary | ICD-10-CM

## 2016-04-21 DIAGNOSIS — Z66 Do not resuscitate: Secondary | ICD-10-CM | POA: Diagnosis present

## 2016-04-21 DIAGNOSIS — K529 Noninfective gastroenteritis and colitis, unspecified: Secondary | ICD-10-CM | POA: Diagnosis present

## 2016-04-21 DIAGNOSIS — Z95828 Presence of other vascular implants and grafts: Secondary | ICD-10-CM

## 2016-04-21 DIAGNOSIS — F1020 Alcohol dependence, uncomplicated: Secondary | ICD-10-CM | POA: Diagnosis not present

## 2016-04-21 DIAGNOSIS — J80 Acute respiratory distress syndrome: Secondary | ICD-10-CM

## 2016-04-21 DIAGNOSIS — R7989 Other specified abnormal findings of blood chemistry: Secondary | ICD-10-CM | POA: Diagnosis not present

## 2016-04-21 DIAGNOSIS — Z885 Allergy status to narcotic agent status: Secondary | ICD-10-CM | POA: Diagnosis not present

## 2016-04-21 DIAGNOSIS — J1100 Influenza due to unidentified influenza virus with unspecified type of pneumonia: Secondary | ICD-10-CM | POA: Diagnosis not present

## 2016-04-21 DIAGNOSIS — K860 Alcohol-induced chronic pancreatitis: Secondary | ICD-10-CM

## 2016-04-21 DIAGNOSIS — E876 Hypokalemia: Secondary | ICD-10-CM

## 2016-04-21 DIAGNOSIS — R0989 Other specified symptoms and signs involving the circulatory and respiratory systems: Secondary | ICD-10-CM | POA: Diagnosis not present

## 2016-04-21 DIAGNOSIS — J449 Chronic obstructive pulmonary disease, unspecified: Secondary | ICD-10-CM | POA: Diagnosis not present

## 2016-04-21 DIAGNOSIS — Z79899 Other long term (current) drug therapy: Secondary | ICD-10-CM | POA: Diagnosis not present

## 2016-04-21 DIAGNOSIS — F17200 Nicotine dependence, unspecified, uncomplicated: Secondary | ICD-10-CM | POA: Diagnosis not present

## 2016-04-21 DIAGNOSIS — R0600 Dyspnea, unspecified: Secondary | ICD-10-CM

## 2016-04-21 DIAGNOSIS — Z8719 Personal history of other diseases of the digestive system: Secondary | ICD-10-CM | POA: Diagnosis not present

## 2016-04-21 DIAGNOSIS — K8681 Exocrine pancreatic insufficiency: Secondary | ICD-10-CM | POA: Diagnosis present

## 2016-04-21 DIAGNOSIS — J069 Acute upper respiratory infection, unspecified: Secondary | ICD-10-CM | POA: Diagnosis not present

## 2016-04-21 DIAGNOSIS — E871 Hypo-osmolality and hyponatremia: Secondary | ICD-10-CM

## 2016-04-21 DIAGNOSIS — Z9049 Acquired absence of other specified parts of digestive tract: Secondary | ICD-10-CM

## 2016-04-21 DIAGNOSIS — R918 Other nonspecific abnormal finding of lung field: Secondary | ICD-10-CM | POA: Diagnosis not present

## 2016-04-21 DIAGNOSIS — Z9989 Dependence on other enabling machines and devices: Secondary | ICD-10-CM | POA: Diagnosis not present

## 2016-04-21 DIAGNOSIS — Z9981 Dependence on supplemental oxygen: Secondary | ICD-10-CM | POA: Diagnosis not present

## 2016-04-21 DIAGNOSIS — F1010 Alcohol abuse, uncomplicated: Secondary | ICD-10-CM | POA: Diagnosis present

## 2016-04-21 DIAGNOSIS — Z720 Tobacco use: Secondary | ICD-10-CM | POA: Diagnosis present

## 2016-04-21 DIAGNOSIS — J159 Unspecified bacterial pneumonia: Secondary | ICD-10-CM | POA: Diagnosis not present

## 2016-04-21 LAB — COMPREHENSIVE METABOLIC PANEL
ALBUMIN: 2.5 g/dL — AB (ref 3.5–5.0)
ALBUMIN: 2.4 g/dL — AB (ref 3.5–5.0)
ALK PHOS: 178 U/L — AB (ref 38–126)
ALK PHOS: 167 U/L — AB (ref 38–126)
ALT: 47 U/L (ref 14–54)
ALT: 43 U/L (ref 14–54)
ANION GAP: 16 — AB (ref 5–15)
ANION GAP: 12 (ref 5–15)
AST: 87 U/L — AB (ref 15–41)
AST: 75 U/L — ABNORMAL HIGH (ref 15–41)
BILIRUBIN TOTAL: 1 mg/dL (ref 0.3–1.2)
BILIRUBIN TOTAL: 0.6 mg/dL (ref 0.3–1.2)
BUN: 5 mg/dL — AB (ref 6–20)
BUN: 5 mg/dL — AB (ref 6–20)
CALCIUM: 7.5 mg/dL — AB (ref 8.9–10.3)
CALCIUM: 7 mg/dL — AB (ref 8.9–10.3)
CO2: 23 mmol/L (ref 22–32)
CO2: 24 mmol/L (ref 22–32)
CREATININE: 1.4 mg/dL — AB (ref 0.44–1.00)
Chloride: 87 mmol/L — ABNORMAL LOW (ref 101–111)
Chloride: 93 mmol/L — ABNORMAL LOW (ref 101–111)
Creatinine, Ser: 1.18 mg/dL — ABNORMAL HIGH (ref 0.44–1.00)
GFR calc Af Amer: 48 mL/min — ABNORMAL LOW (ref >60)
GFR calc Af Amer: 59 mL/min — ABNORMAL LOW (ref >60)
GFR calc non Af Amer: 41 mL/min — ABNORMAL LOW (ref >60)
GFR calc non Af Amer: 51 mL/min — ABNORMAL LOW (ref >60)
GLUCOSE: 128 mg/dL — AB (ref 65–99)
GLUCOSE: 179 mg/dL — AB (ref 65–99)
Potassium: <2 mmol/L — CL (ref 3.5–5.1)
Potassium: 2.3 mmol/L — CL (ref 3.5–5.1)
SODIUM: 126 mmol/L — AB (ref 135–145)
SODIUM: 129 mmol/L — AB (ref 135–145)
TOTAL PROTEIN: 6 g/dL — AB (ref 6.5–8.1)
TOTAL PROTEIN: 5.8 g/dL — AB (ref 6.5–8.1)

## 2016-04-21 LAB — CBC WITH DIFFERENTIAL/PLATELET
BASOS PCT: 1 %
Basophils Absolute: 0 10*3/uL (ref 0.0–0.1)
Eosinophils Absolute: 0.1 10*3/uL (ref 0.0–0.7)
Eosinophils Relative: 1 %
HEMATOCRIT: 35.8 % — AB (ref 36.0–46.0)
HEMOGLOBIN: 12.8 g/dL (ref 12.0–15.0)
Lymphocytes Relative: 18 %
Lymphs Abs: 1.2 10*3/uL (ref 0.7–4.0)
MCH: 35.6 pg — ABNORMAL HIGH (ref 26.0–34.0)
MCHC: 35.8 g/dL (ref 30.0–36.0)
MCV: 99.4 fL (ref 78.0–100.0)
MONOS PCT: 11 %
Monocytes Absolute: 0.7 10*3/uL (ref 0.1–1.0)
NEUTROS ABS: 4.5 10*3/uL (ref 1.7–7.7)
NEUTROS PCT: 69 %
Platelets: 193 10*3/uL (ref 150–400)
RBC: 3.6 MIL/uL — AB (ref 3.87–5.11)
RDW: 15.8 % — ABNORMAL HIGH (ref 11.5–15.5)
WBC: 6.5 10*3/uL (ref 4.0–10.5)

## 2016-04-21 LAB — BASIC METABOLIC PANEL
ANION GAP: 14 (ref 5–15)
ANION GAP: 12 (ref 5–15)
BUN: <5 mg/dL — ABNORMAL LOW (ref 6–20)
BUN: 5 mg/dL — ABNORMAL LOW (ref 6–20)
CALCIUM: 7 mg/dL — AB (ref 8.9–10.3)
CALCIUM: 7.1 mg/dL — AB (ref 8.9–10.3)
CHLORIDE: 91 mmol/L — AB (ref 101–111)
CO2: 23 mmol/L (ref 22–32)
CO2: 23 mmol/L (ref 22–32)
Chloride: 98 mmol/L — ABNORMAL LOW (ref 101–111)
Creatinine, Ser: 1.23 mg/dL — ABNORMAL HIGH (ref 0.44–1.00)
Creatinine, Ser: 1.13 mg/dL — ABNORMAL HIGH (ref 0.44–1.00)
GFR calc Af Amer: >60 mL/min (ref >60)
GFR calc non Af Amer: 48 mL/min — ABNORMAL LOW (ref >60)
GFR calc non Af Amer: 54 mL/min — ABNORMAL LOW (ref >60)
GFR, EST AFRICAN AMERICAN: 56 mL/min — AB (ref >60)
GLUCOSE: 140 mg/dL — AB (ref 65–99)
GLUCOSE: 182 mg/dL — AB (ref 65–99)
POTASSIUM: 2.6 mmol/L — AB (ref 3.5–5.1)
Potassium: 2 mmol/L — CL (ref 3.5–5.1)
Sodium: 128 mmol/L — ABNORMAL LOW (ref 135–145)
Sodium: 133 mmol/L — ABNORMAL LOW (ref 135–145)

## 2016-04-21 LAB — I-STAT CG4 LACTIC ACID, ED
Lactic Acid, Venous: 3.79 mmol/L (ref 0.5–1.9)
Lactic Acid, Venous: 1.61 mmol/L (ref 0.5–1.9)

## 2016-04-21 LAB — I-STAT TROPONIN, ED: Troponin i, poc: 0.01 ng/mL (ref 0.00–0.08)

## 2016-04-21 LAB — VITAMIN B12: Vitamin B-12: 897 pg/mL (ref 180–914)

## 2016-04-21 LAB — I-STAT ARTERIAL BLOOD GAS, ED
Acid-base deficit: 3 mmol/L — ABNORMAL HIGH (ref 0.0–2.0)
BICARBONATE: 22 mmol/L (ref 20.0–28.0)
O2 Saturation: 89 %
PH ART: 7.388 (ref 7.350–7.450)
PO2 ART: 56 mmHg — AB (ref 83.0–108.0)
TCO2: 23 mmol/L (ref 0–100)
pCO2 arterial: 36.6 mmHg (ref 32.0–48.0)

## 2016-04-21 LAB — PROCALCITONIN: Procalcitonin: 0.24 ng/mL

## 2016-04-21 LAB — INFLUENZA PANEL BY PCR (TYPE A & B)
INFLAPCR: NEGATIVE
Influenza B By PCR: NEGATIVE

## 2016-04-21 LAB — BRAIN NATRIURETIC PEPTIDE: B Natriuretic Peptide: 146.1 pg/mL — ABNORMAL HIGH (ref 0.0–100.0)

## 2016-04-21 LAB — MAGNESIUM: Magnesium: 2.1 mg/dL (ref 1.7–2.4)

## 2016-04-21 LAB — MRSA PCR SCREENING: MRSA by PCR: NEGATIVE

## 2016-04-21 MED ORDER — METHYLPREDNISOLONE SODIUM SUCC 125 MG IJ SOLR
125.0000 mg | Freq: Once | INTRAMUSCULAR | Status: AC
  Administered 2016-04-21: 125 mg via INTRAVENOUS
  Filled 2016-04-21: qty 2

## 2016-04-21 MED ORDER — SODIUM CHLORIDE 0.9 % IV SOLN
30.0000 meq | INTRAVENOUS | Status: AC
  Administered 2016-04-21 – 2016-04-22 (×3): 30 meq via INTRAVENOUS
  Filled 2016-04-21 (×4): qty 15

## 2016-04-21 MED ORDER — ENOXAPARIN SODIUM 40 MG/0.4ML ~~LOC~~ SOLN
40.0000 mg | SUBCUTANEOUS | Status: DC
  Filled 2016-04-21 (×3): qty 0.4

## 2016-04-21 MED ORDER — PNEUMOCOCCAL VAC POLYVALENT 25 MCG/0.5ML IJ INJ
0.5000 mL | INJECTION | INTRAMUSCULAR | Status: DC
Start: 2016-04-22 — End: 2016-04-22
  Filled 2016-04-21: qty 0.5

## 2016-04-21 MED ORDER — DEXTROSE 5 % IV SOLN: 1.0000 g | Freq: Once | INTRAVENOUS | Status: DC

## 2016-04-21 MED ORDER — IPRATROPIUM BROMIDE 0.02 % IN SOLN
1.0000 mg | Freq: Once | RESPIRATORY_TRACT | Status: AC
  Administered 2016-04-21: 1 mg via RESPIRATORY_TRACT
  Filled 2016-04-21: qty 5

## 2016-04-21 MED ORDER — POTASSIUM CHLORIDE CRYS ER 20 MEQ PO TBCR
40.0000 meq | EXTENDED_RELEASE_TABLET | Freq: Once | ORAL | Status: DC
  Filled 2016-04-21: qty 2

## 2016-04-21 MED ORDER — SODIUM CHLORIDE 0.9 % IV SOLN
30.0000 meq | Freq: Once | INTRAVENOUS | Status: AC
  Administered 2016-04-21: 30 meq via INTRAVENOUS
  Filled 2016-04-21: qty 15

## 2016-04-21 MED ORDER — IPRATROPIUM-ALBUTEROL 0.5-2.5 (3) MG/3ML IN SOLN
3.0000 mL | Freq: Once | RESPIRATORY_TRACT | Status: AC
  Administered 2016-04-21: 3 mL via RESPIRATORY_TRACT
  Filled 2016-04-21: qty 3

## 2016-04-21 MED ORDER — SODIUM CHLORIDE 0.9 % IV BOLUS (SEPSIS)
1000.0000 mL | Freq: Once | INTRAVENOUS | Status: AC
  Administered 2016-04-21: 1000 mL via INTRAVENOUS

## 2016-04-21 MED ORDER — SODIUM CHLORIDE 0.9 % IV BOLUS (SEPSIS)
1000.0000 mL | Freq: Once | INTRAVENOUS | Status: AC
Start: 2016-04-21 — End: 2016-04-21
  Administered 2016-04-21: 1000 mL via INTRAVENOUS

## 2016-04-21 MED ORDER — SODIUM CHLORIDE 0.9 % IV BOLUS (SEPSIS)
500.0000 mL | Freq: Once | INTRAVENOUS | Status: AC
  Administered 2016-04-21: 500 mL via INTRAVENOUS

## 2016-04-21 MED ORDER — ALBUTEROL (5 MG/ML) CONTINUOUS INHALATION SOLN
10.0000 mg/h | INHALATION_SOLUTION | Freq: Once | RESPIRATORY_TRACT | Status: AC
  Administered 2016-04-21: 10 mg/h via RESPIRATORY_TRACT
  Filled 2016-04-21: qty 20

## 2016-04-21 MED ORDER — POTASSIUM CHLORIDE 2 MEQ/ML IV SOLN
30.0000 meq | Freq: Once | INTRAVENOUS | Status: AC
  Administered 2016-04-21: 30 meq via INTRAVENOUS
  Filled 2016-04-21: qty 15

## 2016-04-21 MED ORDER — INFLUENZA VAC SPLIT QUAD 0.5 ML IM SUSY
0.5000 mL | PREFILLED_SYRINGE | INTRAMUSCULAR | Status: DC
  Filled 2016-04-21: qty 0.5

## 2016-04-21 MED ORDER — MAGNESIUM SULFATE 2 GM/50ML IV SOLN
2.0000 g | Freq: Once | INTRAVENOUS | Status: AC
  Administered 2016-04-21: 2 g via INTRAVENOUS
  Filled 2016-04-21: qty 50

## 2016-04-21 MED ORDER — OSELTAMIVIR PHOSPHATE 75 MG PO CAPS
75.0000 mg | ORAL_CAPSULE | Freq: Two times a day (BID) | ORAL | Status: DC
  Administered 2016-04-22 – 2016-04-26 (×9): 75 mg via ORAL
  Filled 2016-04-21 (×10): qty 1

## 2016-04-21 MED ORDER — DEXTROSE 5 % IV SOLN
1.0000 g | INTRAVENOUS | Status: DC
  Administered 2016-04-21 – 2016-04-25 (×5): 1 g via INTRAVENOUS
  Filled 2016-04-21 (×5): qty 10

## 2016-04-21 MED ORDER — DEXTROSE 5 % IV SOLN
500.0000 mg | INTRAVENOUS | Status: DC
  Administered 2016-04-21 – 2016-04-25 (×5): 500 mg via INTRAVENOUS
  Filled 2016-04-21 (×5): qty 500

## 2016-04-21 MED ORDER — DEXTROSE 5 % IV SOLN: 500.0000 mg | Freq: Once | INTRAVENOUS | Status: DC

## 2016-04-21 MED ORDER — SODIUM CHLORIDE 0.9 % IV BOLUS (SEPSIS)
1000.0000 mL | Freq: Once | INTRAVENOUS | Status: DC
  Administered 2016-04-21: 1000 mL via INTRAVENOUS

## 2016-04-21 NOTE — ED Triage Notes (Signed)
Pt c/o SOB worsening today [facility] & was sent here for eval d/t O2 sats low, pt did not come for eval, pt reports productive cough with brown sputum, pt reports mid CP with cough, hs COPD, pt O2 sats in the 70s in triage, pt placed onb 6 L [place] with O2 sats not improving, pt placed on NRB with )2 sats 87 %, pt A&O x4, speaks in short sentences

## 2016-04-21 NOTE — ED Notes (Signed)
Admitting MD at bedside.

## 2016-04-21 NOTE — H&P (Signed)
Date: 04/21/2016               Patient Name:  Sharon Kent MRN: [id number]  DOB: 1960-08-19 Age / Sex: 56 y.o., female   PCP: Pcp Not In System         Medical Service: Internal Medicine Teaching Service         Attending Physician: Dr. Tyson Alias, MD    First Contact: Dr. Obie Dredge Pager: [phone]  Second Contact: Dr. 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 Over this interval, she started feeling achy all over, tired, short of breath, poor appetite, dizziness, productive cough associated with a headache over the past week but no wheezing. She was establishing care with [facility] 2 days ago at which time she was told she did not have pneumonia or influenza and was instructed to proceed to the emergency department for further evaluation. The wait time was 6 hours, so she went home instead. She presented to the ED today on BiPAP. Otherwise, she denies wheezing, chest pain/tightness, abdominal pain, nausea, vomiting.  She has a history of pancreatitis which she recalls starting 7-8 years ago and was complicated by pseudocyst formation and necrosis which warranted resection as well as cholecystectomy. She was told these episodes were associated with her alcohol intake though she does not believe that they though feels a recurrence of some abdominal pain following the consumption of more than 2 glasses of wine the evening before. She also has a  history of asthma for which she takes albuterol inhaler though has been some time before she was last hospitalized for an asthma exacerbation. In her lifetime, she has been intubated twice, most recently 15 years ago. She denies a prior history of diabetes, heart attack, stroke. Aside from alcohol, she has smoked 1/2 pack/day since the age of 29, quit for 10 years, but resumed her habit following the death of her only son.  In the emergency department, her initial lab work was notable for potassium <2, bicarbonate 23, BUN 15, creatinine 1.40 anion gap 16 for which she was given potassium supplementation, and on recheck, labwork showed potassium 2, bicarbonate 23, BUN less than 5, creatinine 1.2 . ABG was notable for pH 7.388, CO2 36.6, bicarbonate 22. Chest x-ray was notable for extensive interstitial infiltrate of the left lung, and she was started on azithromycin and ceftriaxone.    Meds:  Current Meds  Medication Sig  . acetaminophen (TYLENOL) 325 MG tablet Take 650 mg by mouth every 6 (six) hours as needed for moderate pain.   Marland Kitchen amLODipine (NORVASC) 10 MG tablet Take 10 mg by mouth daily.  Marland Kitchen Dextromethorphan-Guaifenesin (ROBITUSSIN COUGH+CHEST CONG DM) 5-100 MG/5ML LIQD Take 20 mLs by mouth daily as needed (cough).  . esomeprazole (NEXIUM) 20 MG capsule Take 20 mg by mouth daily before breakfast.  . ibuprofen (ADVIL,MOTRIN) 200 MG tablet Take 400 mg by mouth every 6 (six) hours as needed.  . labetalol (NORMODYNE) 100 MG tablet Take  200 mg by mouth 2 (two) times daily.  . lipase/protease/amylase (CREON) 12000 units CPEP capsule Take 24,000 Units by mouth 3 (three) times daily with meals.  . Multiple Vitamins-Minerals (MULTIVITAMIN WITH MINERALS) tablet Take 1 tablet by mouth daily.  . pantoprazole (PROTONIX) 40 MG tablet Take 40 mg by mouth daily.   Self-reported medication list -Creon 24,000 units to be taken 3 times daily with meals -Albuterol inhaler as needed  Allergies: Allergies as of  04/21/2016 - Review Complete 04/21/2016  Allergen Reaction Noted  . Morphine Nausea Only 11/23/2015  . Oxycodone-acetaminophen Other (See Comments) 12/26/2012   No past medical history on file.  Family History: Unknown as she is adopted.  Social History: No history of illicit drug use. Otherwise as noted in the HPI.  Review of Systems: Chronic diarrhea but no new rash, syncope, dysuria, increased frequency. A complete ROS was negative except as per HPI.   Physical Exam: Blood pressure 105/65, pulse 67, resp. rate 19, height 5\' 6"  (1.676 m), weight 180 lb (81.6 kg), SpO2 90 %. Physical Exam  Constitutional: She is oriented to person, place, and time. No distress.  Breathing comfortably with the BiPAP mask.   HENT:  Head: Normocephalic and atraumatic.  Eyes: Conjunctivae are normal. No scleral icterus.  Cardiovascular: Normal rate, regular rhythm and intact distal pulses.   Pulmonary/Chest: No respiratory distress. She has no wheezes.  Crackles present bilaterally  Abdominal: Soft. Bowel sounds are normal. She exhibits no distension. There is no tenderness. There is no rebound and no guarding.  Multiple incisions noted on the abdomen from prior procedures.  Musculoskeletal: She exhibits no edema or deformity.  Neurological: She is alert and oriented to person, place, and time.  Skin: She is not diaphoretic.   EKG: I reviewed and compared with none prior. -Normal axis, rhythm -Intraventricular conduction delay -Q waves noted in aVL, possibly I  CXR: I reviewed and compared with none prior. Increased interstitial markings at the left lung as compared to the right.  Assessment & Plan by Problem: Principal Problem:   ARDS (adult respiratory distress syndrome) (HCC) Active Problems:   History of pancreatitis   Asthma   S/P IVC filter   S/P cholecystectomy   Tobacco abuse   Alcohol abuse  Sharon Kent is a 56 year old female with history of pancreatitis status post pancreatic  resection and cholecystectomy, asthma hospitalized for acute respiratory distress syndrome  Acute respiratory distress syndrome: Consistent with her chest x-ray findings, elevated A-A gradient, and low PaO2/FiO2 ratio. Likely from pneumomia related to influenza infection. Suspect she has some underlying lung disease with her smoking history as well.  -Continue BiPAP as tolerated  -Check procalcitonin to differentiate between viral and bacterial etiology -Check flu PCR and treat if positive -Continue azithromycin and ceftriaxone -Start oseltamivir for empiric influenza therapy  Severe hypokalemia: Likely related to poor oral intake superimposed upon pancreatic insufficiency, chronic alcohol use, and chronic diarrhea.  -Check magnesium and replete with goal greater than 2 -Check potassium and replete with goal greater than 4  History of necrotizing hemorrhagic pancreatitis status post pancreatic resection and cholecystectomy: No abdominal pain. -Resume Creon pending oral diet  Tobacco and alcohol abuse: Recommend cessation of tobacco and alcohol.  #FEN:  -Diet: NPO  #DVT prophylaxis: Lovenox  #CODE STATUS: DNR/DNI -Defer to roommate Ralene Muskrat [phone]] if patients lacks decision-making capacity -Confirmed with patient on admission given her prior experience  Dispo: Admit patient to Inpatient with expected length of stay greater than 2 midnights.  Signed: Beather Arbourushil V Andric Kerce, MD 04/21/2016, 3:46 PM  Pager: [phone]

## 2016-04-21 NOTE — ED Notes (Signed)
Pt up to bedpan for 300 cc

## 2016-04-21 NOTE — Progress Notes (Signed)
Pt is resting comfortably and tolerating the NIV well no complications or distress noted.

## 2016-04-21 NOTE — Progress Notes (Signed)
CRITICAL VALUE ALERT  Critical value received:  Potassium 2.6   Date of notification:  04/21/2016  Time of notification:  2057  Critical value read back: Yes  Nurse who received alert:  Jiles GarterMorgan N. RN  MD notified (1st page):  [facility] On-Call  Time of first page:  2100  MD notified (2nd page):  Time of second page:  Responding MD:  Eulah PontNina Blum, MD  Time MD responded:  2105

## 2016-04-21 NOTE — ED Provider Notes (Signed)
[facility]-EMERGENCY DEPT Provider Note   CSN: [id number] Arrival date & time: 04/21/16  0758     History   Chief Complaint Chief Complaint  Patient presents with  . Shortness of Breath    HPI Sharon Kent is a 56 y.o. female.  HPI 56 year old female with past medical history as above who presents with cough and shortness of breath. The patient states that her symptoms started approximately 4-5 days ago with cough and general fatigue. [facility]. She was reportedly hypoxic at that time with wheezing. She was sent to the ED but due to the long wait, she returned to the house and has been there since then. She states she has been barely able to get out of bed over the last 2 days. She has associated severe fatigue, fevers, chills, and cough with sputum production. She denies any associated chest pain. She's had nausea but no vomiting. Her symptoms have progressively worsened so she decided to present back to the emergency department today. In triage, patient was noted to be hypoxic in the 70s on room air. She does not use oxygen at baseline.  No past medical history on file.  Patient Active Problem List   Diagnosis Date Noted  . ARDS (adult respiratory distress syndrome) (HCC) 04/21/2016  . History of pancreatitis 04/21/2016  . Asthma 04/21/2016  . 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 S/P cholecystectomy 04/21/2016  . Tobacco abuse 04/21/2016  . Alcohol abuse 04/21/2016    Past Surgical History:  Procedure Laterality Date  . PANCREAS SURGERY      OB History    No data available       Home Medications    Prior to Admission medications   Medication Sig Start Date End Date Taking? Authorizing Provider  acetaminophen (TYLENOL) 325 MG tablet Take 650 mg by mouth every 6 (six) hours as needed for moderate pain.    Yes Historical Provider, MD  amLODipine (NORVASC) 10 MG tablet Take 10 mg by mouth daily. 02/14/16  Yes Historical Provider, MD    Dextromethorphan-Guaifenesin (ROBITUSSIN COUGH+CHEST CONG DM) 5-100 MG/5ML LIQD Take 20 mLs by mouth daily as needed (cough).   Yes Historical Provider, MD  esomeprazole (NEXIUM) 20 MG capsule Take 20 mg by mouth daily before breakfast.   Yes Historical Provider, MD  ibuprofen (ADVIL,MOTRIN) 200 MG tablet Take 400 mg by mouth every 6 (six) hours as needed.   Yes Historical Provider, MD  labetalol (NORMODYNE) 100 MG tablet Take 200 mg by mouth 2 (two) times daily.   Yes Historical Provider, MD  lipase/protease/amylase (CREON) 12000 units CPEP capsule Take 24,000 Units by mouth 3 (three) times daily with meals. 11/27/15  Yes Historical Provider, MD  Multiple Vitamins-Minerals (MULTIVITAMIN WITH MINERALS) tablet Take 1 tablet by mouth daily.   Yes Historical Provider, MD  pantoprazole (PROTONIX) 40 MG tablet Take 40 mg by mouth daily. 03/13/16  Yes Historical Provider, MD  potassium chloride SA (K-DUR,KLOR-CON) 20 MEQ tablet Take 20 mEq by mouth 2 (two) times daily. 07/25/14   Historical Provider, MD    Family History No family history on file.  Social History Social History  Substance Use Topics  . Smoking status: Not on file  . Smokeless tobacco: Not on file  . Alcohol use Not on file     Allergies   Morphine and Oxycodone-acetaminophen   Review of Systems Review of Systems  Constitutional: Positive for chills, fatigue and fever.  HENT: Negative for congestion  and rhinorrhea.   Eyes: Negative for visual disturbance.  Respiratory: Positive for cough, shortness of breath and wheezing.   Cardiovascular: Negative for chest pain and leg swelling.  Gastrointestinal: Negative for abdominal pain, diarrhea, nausea and vomiting.  Genitourinary: Negative for dysuria and flank pain.  Musculoskeletal: Negative for neck pain and neck stiffness.  Skin: Negative for rash and wound.  Allergic/Immunologic: Negative for immunocompromised state.  Neurological: Positive for weakness. Negative for  syncope and headaches.  All other systems reviewed and are negative.    Physical Exam Updated Vital Signs BP 104/67   Pulse 61   Resp 16   Ht 5\' 6"  (1.676 m)   Wt 180 lb (81.6 kg)   SpO2 91%   BMI 29.05 kg/m   Physical Exam  Constitutional: She is oriented to person, place, and time. She appears well-developed and well-nourished. She appears distressed.  HENT:  Head: Normocephalic and atraumatic.  Mouth/Throat: Oropharynx is clear and moist.  Eyes: Conjunctivae are normal.  Neck: Neck supple.  Cardiovascular: Normal rate, regular rhythm and normal heart sounds.  Exam reveals no friction rub.   No murmur heard. Pulmonary/Chest: Accessory muscle usage present. Tachypnea noted. No respiratory distress. She has decreased breath sounds. She has wheezes in the right upper field, the right middle field, the right lower field, the left upper field, the left middle field and the left lower field. She has rhonchi in the right middle field, the right lower field, the left middle field and the left lower field. She has no rales.  Abdominal: She exhibits no distension.  Musculoskeletal: She exhibits no edema (1+ pitting b/l).  Neurological: She is alert and oriented to person, place, and time. She exhibits normal muscle tone.  Skin: Skin is warm. Capillary refill takes less than 2 seconds.  Psychiatric: She has a normal mood and affect.  Nursing note and vitals reviewed.    ED Treatments / Results  Labs (all labs ordered are listed, but only abnormal results are displayed) Labs Reviewed  BRAIN NATRIURETIC PEPTIDE - Abnormal; Notable for the following:       Result Value   B Natriuretic Peptide 146.1 (*)    All other components within normal limits  CBC WITH DIFFERENTIAL/PLATELET - Abnormal; Notable for the following:    RBC 3.60 (*)    HCT 35.8 (*)    MCH 35.6 (*)    RDW 15.8 (*)    All other components within normal limits  COMPREHENSIVE METABOLIC PANEL - Abnormal; Notable for  the following:    Sodium 126 (*)    Potassium <2.0 (*)    Chloride 87 (*)    Glucose, Bld 128 (*)    BUN 5 (*)    Creatinine, Ser 1.40 (*)    Calcium 7.5 (*)    Total Protein 6.0 (*)    Albumin 2.5 (*)    AST 87 (*)    Alkaline Phosphatase 178 (*)    GFR calc non Af Amer 41 (*)    GFR calc Af Amer 48 (*)    Anion gap 16 (*)    All other components within normal limits  BASIC METABOLIC PANEL - Abnormal; Notable for the following:    Sodium 128 (*)    Potassium 2.0 (*)    Chloride 91 (*)    Glucose, Bld 140 (*)    BUN <5 (*)    Creatinine, Ser 1.23 (*)    Calcium 7.0 (*)    GFR calc non Af [place] DagoAmer  48 (*)    GFR calc Af Amer 56 (*)    All other components within normal limits  COMPREHENSIVE METABOLIC PANEL - Abnormal; Notable for the following:    Sodium 129 (*)    Potassium 2.3 (*)    Chloride 93 (*)    Glucose, Bld 179 (*)    BUN 5 (*)    Creatinine, Ser 1.18 (*)    Calcium 7.0 (*)    Total Protein 5.8 (*)    Albumin 2.4 (*)    AST 75 (*)    Alkaline Phosphatase 167 (*)    GFR calc non Af Amer 51 (*)    GFR calc Af Amer 59 (*)    All other components within normal limits  I-STAT ARTERIAL BLOOD GAS, ED - Abnormal; Notable for the following:    pO2, Arterial 56.0 (*)    Acid-base deficit 3.0 (*)    All other components within normal limits  I-STAT CG4 LACTIC ACID, ED - Abnormal; Notable for the following:    Lactic Acid, Venous 3.79 (*)    All other components within normal limits  CULTURE, BLOOD (ROUTINE X 2)  CULTURE, BLOOD (ROUTINE X 2)  MAGNESIUM  PROCALCITONIN  VITAMIN B12  INFLUENZA PANEL BY PCR (TYPE A & B, H1N1)  FOLATE RBC  BASIC METABOLIC PANEL  BASIC METABOLIC PANEL  I-STAT TROPOININ, ED  I-STAT CG4 LACTIC ACID, ED  I-STAT ARTERIAL BLOOD GAS, ED    EKG  EKG Interpretation  Date/Time:  Friday April 21 2016 08:30:35 EST Ventricular Rate:  62 PR Interval:    QRS Duration: 123 QT Interval:  489 QTC Calculation: 497 R Axis:   49 Text  Interpretation:  Sinus rhythm Nonspecific intraventricular conduction delay Anteroseptal infarct, age indeterminate Baseline wander in lead(s) V1 V2 Non-specific ST depressions No old tracing to compare Confirmed by Aaniya Sterba MD, Kahleah Crass [phone]) on 04/21/2016 8:47:40 AM       Radiology Dg Chest Portable 1 View  Result Date: 04/21/2016 CLINICAL DATA:  Shortness of breath. Decreased O2 saturation. Productive cough. EXAM: PORTABLE CHEST 1 VIEW COMPARISON:  None. FINDINGS: There is a diffuse interstitial infiltrate in the left lung. Slight elevation of the lateral aspect of the right hemidiaphragm. No infiltrates on the right. Heart size and pulmonary vascularity are normal. Slight thoracic scoliosis. IMPRESSION: Extensive interstitial infiltrate in the left lung. Aortic atherosclerosis. Electronically Signed   By: Francene Boyers M.D.   On: 04/21/2016 09:26    Procedures .Critical Care Performed by: Shaune Pollack Authorized by: Shaune Pollack   Critical care provider statement:    Critical care time (minutes):  45   Critical care time was exclusive of:  Separately billable procedures and treating other patients   Critical care was necessary to treat or prevent imminent or life-threatening deterioration of the following conditions:  Respiratory failure and sepsis   Critical care was time spent personally by me on the following activities:  Development of treatment plan with patient or surrogate, blood draw for specimens, discussions with consultants, evaluation of patient's response to treatment, obtaining history from patient or surrogate, ordering and performing treatments and interventions, ordering and review of laboratory studies, pulse oximetry, ordering and review of radiographic studies, re-evaluation of patient's condition and review of old charts   I assumed direction of critical care for this patient from another provider in my specialty: no     (including critical care  time)  Medications Ordered in ED Medications  azithromycin (ZITHROMAX) 500 mg in dextrose  5 % 250 mL IVPB (0 mg Intravenous Stopped 04/21/16 1112)  cefTRIAXone (ROCEPHIN) 1 g in dextrose 5 % 50 mL IVPB (0 g Intravenous Stopped 04/21/16 1112)  potassium chloride SA (K-DUR,KLOR-CON) CR tablet 40 mEq (40 mEq Oral Not Given 04/21/16 1227)  oseltamivir (TAMIFLU) capsule 75 mg (not administered)  potassium chloride 30 mEq in sodium chloride 0.9 % 265 mL (KCL MULTIRUN) IVPB (not administered)  methylPREDNISolone sodium succinate (SOLU-MEDROL) 125 mg/2 mL injection 125 mg (125 mg Intravenous Given 04/21/16 0836)  albuterol (PROVENTIL,VENTOLIN) solution continuous neb (10 mg/hr Nebulization Given 04/21/16 0844)  ipratropium (ATROVENT) nebulizer solution 1 mg (1 mg Nebulization Given 04/21/16 0844)  sodium chloride 0.9 % bolus 1,000 mL (0 mLs Intravenous Stopped 04/21/16 1112)    And  sodium chloride 0.9 % bolus 1,000 mL (0 mLs Intravenous Stopped 04/21/16 1112)    And  sodium chloride 0.9 % bolus 500 mL (0 mLs Intravenous Stopped 04/21/16 1205)  ipratropium-albuterol (DUONEB) 0.5-2.5 (3) MG/3ML nebulizer solution 3 mL (3 mLs Nebulization Given 04/21/16 1113)  potassium chloride 30 mEq in sodium chloride 0.9 % 265 mL (KCL MULTIRUN) IVPB (30 mEq Intravenous Given 04/21/16 1222)  magnesium sulfate IVPB 2 g 50 mL (0 g Intravenous Stopped 04/21/16 1330)     Initial Impression / Assessment and Plan / ED Course  I have reviewed the triage vital signs and the nursing notes.  Pertinent labs & imaging results that were available during my care of the patient were reviewed by me and considered in my medical decision making (see chart for details).  Clinical Course     56 yo F with PMHx as above here with cough, SOB, subjective fevers. On arrival, pt profoundly hypoxic, requiring NRB for oxygenation. She has diffuze wheezing as well but primarily left basilar rales and decreased aeration. Stat CXR shows left sided  PNA. Will start code sepsis, Rocephin/Azithro, and plan for admission. EKG non-ischemic.  Labs as above. Pt has significant hyponatremia, hypokalemia, possibly 2/2 poor PO intake. ABG with hypoxia but no resp acidosis. Labs may be 2/2 dehydration. Concern for hypoxic resp failure 2/2 PNA, possibly 2/2 recent flu. Will admit to SDU.  Final Clinical Impressions(s) / ED Diagnoses   Final diagnoses:  Dyspnea  Acute respiratory failure with hypoxia (HCC)  Community acquired pneumonia of left lower lobe of lung (HCC)  Hypokalemia  Hyponatremia    New Prescriptions New Prescriptions   No medications on file     Shaune Pollack, MD 04/21/16 1707

## 2016-04-21 NOTE — ED Notes (Signed)
Isaacs, MD aware of pt desats, pt to be moved to room

## 2016-04-22 DIAGNOSIS — J189 Pneumonia, unspecified organism: Principal | ICD-10-CM

## 2016-04-22 DIAGNOSIS — E876 Hypokalemia: Secondary | ICD-10-CM

## 2016-04-22 DIAGNOSIS — J181 Lobar pneumonia, unspecified organism: Secondary | ICD-10-CM

## 2016-04-22 LAB — BASIC METABOLIC PANEL
ANION GAP: 10 (ref 5–15)
Anion gap: 10 (ref 5–15)
Anion gap: 12 (ref 5–15)
BUN: 5 mg/dL — ABNORMAL LOW (ref 6–20)
BUN: 7 mg/dL (ref 6–20)
BUN: 6 mg/dL (ref 6–20)
CALCIUM: 7.1 mg/dL — AB (ref 8.9–10.3)
CALCIUM: 7.4 mg/dL — AB (ref 8.9–10.3)
CALCIUM: 7.8 mg/dL — AB (ref 8.9–10.3)
CHLORIDE: 104 mmol/L (ref 101–111)
CO2: 21 mmol/L — AB (ref 22–32)
CO2: 21 mmol/L — AB (ref 22–32)
CO2: 21 mmol/L — ABNORMAL LOW (ref 22–32)
CREATININE: 1.03 mg/dL — AB (ref 0.44–1.00)
Chloride: 107 mmol/L (ref 101–111)
Chloride: 106 mmol/L (ref 101–111)
Creatinine, Ser: 1 mg/dL (ref 0.44–1.00)
Creatinine, Ser: 1.06 mg/dL — ABNORMAL HIGH (ref 0.44–1.00)
GFR calc non Af Amer: >60 mL/min (ref >60)
GFR, EST NON AFRICAN AMERICAN: 58 mL/min — AB (ref >60)
GLUCOSE: 127 mg/dL — AB (ref 65–99)
Glucose, Bld: 129 mg/dL — ABNORMAL HIGH (ref 65–99)
Glucose, Bld: 112 mg/dL — ABNORMAL HIGH (ref 65–99)
POTASSIUM: 3.4 mmol/L — AB (ref 3.5–5.1)
POTASSIUM: 3.6 mmol/L (ref 3.5–5.1)
Potassium: 3.2 mmol/L — ABNORMAL LOW (ref 3.5–5.1)
Sodium: 135 mmol/L (ref 135–145)
Sodium: 138 mmol/L (ref 135–145)
Sodium: 139 mmol/L (ref 135–145)

## 2016-04-22 LAB — MAGNESIUM: MAGNESIUM: 1.8 mg/dL (ref 1.7–2.4)

## 2016-04-22 MED ORDER — SODIUM CHLORIDE 0.9 % IV SOLN
30.0000 meq | INTRAVENOUS | Status: AC
  Administered 2016-04-22 (×2): 30 meq via INTRAVENOUS
  Filled 2016-04-22 (×3): qty 15

## 2016-04-22 MED ORDER — POTASSIUM CHLORIDE CRYS ER 20 MEQ PO TBCR
20.0000 meq | EXTENDED_RELEASE_TABLET | Freq: Once | ORAL | Status: AC
  Administered 2016-04-22: 20 meq via ORAL
  Filled 2016-04-22: qty 1

## 2016-04-22 MED ORDER — GUAIFENESIN-DM 100-10 MG/5ML PO SYRP
5.0000 mL | ORAL_SOLUTION | ORAL | Status: DC | PRN
  Administered 2016-04-22: 5 mL via ORAL
  Filled 2016-04-22: qty 5

## 2016-04-22 MED ORDER — ACETAMINOPHEN 325 MG PO TABS
650.0000 mg | ORAL_TABLET | Freq: Four times a day (QID) | ORAL | Status: DC | PRN
  Administered 2016-04-22 – 2016-04-26 (×6): 650 mg via ORAL
  Filled 2016-04-22 (×6): qty 2

## 2016-04-22 MED ORDER — GUAIFENESIN-CODEINE 100-10 MG/5ML PO SOLN
5.0000 mL | ORAL | Status: DC | PRN
  Administered 2016-04-22 – 2016-04-26 (×6): 5 mL via ORAL
  Filled 2016-04-22 (×6): qty 5

## 2016-04-22 MED ORDER — PANCRELIPASE (LIP-PROT-AMYL) 12000-38000 UNITS PO CPEP
24000.0000 [IU] | ORAL_CAPSULE | Freq: Three times a day (TID) | ORAL | Status: DC
  Administered 2016-04-22 – 2016-04-27 (×15): 24000 [IU] via ORAL
  Filled 2016-04-22 (×14): qty 2

## 2016-04-22 MED ORDER — CHLORHEXIDINE GLUCONATE 0.12 % MT SOLN
15.0000 mL | Freq: Two times a day (BID) | OROMUCOSAL | Status: DC
  Administered 2016-04-22 – 2016-04-26 (×7): 15 mL via OROMUCOSAL
  Filled 2016-04-22 (×8): qty 15

## 2016-04-22 MED ORDER — ORAL CARE MOUTH RINSE
15.0000 mL | Freq: Two times a day (BID) | OROMUCOSAL | Status: DC
  Administered 2016-04-22 – 2016-04-26 (×7): 15 mL via OROMUCOSAL

## 2016-04-22 NOTE — Progress Notes (Signed)
Patient placed on BiPAP per her request, she was having some difficulty breathing. Patient is now resting comfortably on BiPAP. RT will continue to monitor.

## 2016-04-22 NOTE — Progress Notes (Signed)
Pt removed from BiPAP and placed on [place] high flow cannula @ 10L.  Pt is resting comfortably with no signs of distress.  RN @ bedside.

## 2016-04-22 NOTE — Progress Notes (Signed)
  Subjective: Ms. Earnestine MealingVictoria Wahlen says she is feeling better today, she feels like her breathing and lethargy have improved. She stayed on BiPap overnight but was weaned to nasal canula this morning after she was seen during rounds.   Objective:  Vital signs in last 24 hours: Vitals:   04/22/16 0735 04/22/16 0814 04/22/16 0954 04/22/16 1152  BP:    110/77  Pulse: 72   67  Resp: 17   20  Temp: 98.1 F (36.7 C)   98.1 F (36.7 C)  TempSrc: Axillary   Oral  SpO2: 94% 92% 92% 92%  Weight:      Height:       Physical Exam  Constitutional: She appears well-developed. No distress.  Chronically ill appearing woman   Cardiovascular: Normal rate and regular rhythm.   No murmur heard. Pulmonary/Chest: Breath sounds normal.  On Bipap, speaking in full sentences   Abdominal: Soft. She exhibits no distension. There is no tenderness.  Neurological: She is alert.  Skin: Skin is warm and dry.    Assessment/Plan: Pt is a 56 y.o. yo female with a PMHx of recurrent acute pancreatitis, alcohol use disorder who was admitted on 04/21/2016 with symptoms of shortness of breath, which was determined to be secondary to ARDS.   Principal Problem:   ARDS (adult respiratory distress syndrome) (HCC) Active Problems:   History of pancreatitis   Asthma   S/P IVC filter   S/P cholecystectomy   Tobacco abuse   Alcohol abuse   Community acquired pneumonia of left lower lobe of lung (HCC)   Hypokalemia  ARDS  Acute hypoxic respiratory panel  Flu PCR negative but given multiple sick contacts and history of COPD started tamiflu. Procalcitonin 0.24. When seen on rounds this morning she was requiring BiPap with inspiratory pressure 12, expiratory pressure 6, and FiO2 50% and was breathing with tidal volume 600-800. Shortly after rounds she was weaned off of bipap to 10 liters nasal canula is at SpO2 92%.  -goal SpO2 >88%, will resume bipap if needed.   -continue droplet precautions, her flu PCR may be falsely  negative  - follow up chest xray tomorrow morning  -follow up blood cultures  - continue tamiflu, ceftriaxone, and azithromycin  -continue Bipap as tolerated    Hypokalemia  K improving with repletion  - follow up BMP and Mag.   Will need influenza vaccination at the time of discharge or hospital follow up   Dispo: Anticipated discharge in approximately 3-5 day(s).   Eulah PontNina Nydia Ytuarte, MD 04/22/2016, 12:21 PM Pager: [phone]

## 2016-04-22 NOTE — Progress Notes (Signed)
Patient tolerating 10L high flow nasal cannula at this time. RT encouraged patient to call if she feels respiratory distress this night so that we can place BiPAP on. RT will continue to monitor as needed.

## 2016-04-23 ENCOUNTER — Inpatient Hospital Stay (HOSPITAL_COMMUNITY): Payer: Managed Care, Other (non HMO)

## 2016-04-23 DIAGNOSIS — J9601 Acute respiratory failure with hypoxia: Secondary | ICD-10-CM

## 2016-04-23 DIAGNOSIS — R918 Other nonspecific abnormal finding of lung field: Secondary | ICD-10-CM

## 2016-04-23 LAB — BASIC METABOLIC PANEL
ANION GAP: 8 (ref 5–15)
ANION GAP: 10 (ref 5–15)
ANION GAP: 11 (ref 5–15)
BUN: 6 mg/dL (ref 6–20)
BUN: 6 mg/dL (ref 6–20)
BUN: 5 mg/dL — AB (ref 6–20)
CO2: 21 mmol/L — ABNORMAL LOW (ref 22–32)
CO2: 21 mmol/L — ABNORMAL LOW (ref 22–32)
CO2: 22 mmol/L (ref 22–32)
Calcium: 8.2 mg/dL — ABNORMAL LOW (ref 8.9–10.3)
Calcium: 8.3 mg/dL — ABNORMAL LOW (ref 8.9–10.3)
Calcium: 8.4 mg/dL — ABNORMAL LOW (ref 8.9–10.3)
Chloride: 109 mmol/L (ref 101–111)
Chloride: 106 mmol/L (ref 101–111)
Chloride: 105 mmol/L (ref 101–111)
Creatinine, Ser: 0.83 mg/dL (ref 0.44–1.00)
Creatinine, Ser: 0.79 mg/dL (ref 0.44–1.00)
Creatinine, Ser: 0.84 mg/dL (ref 0.44–1.00)
GFR calc non Af Amer: >60 mL/min (ref >60)
Glucose, Bld: 98 mg/dL (ref 65–99)
Glucose, Bld: 100 mg/dL — ABNORMAL HIGH (ref 65–99)
Glucose, Bld: 89 mg/dL (ref 65–99)
POTASSIUM: 3.4 mmol/L — AB (ref 3.5–5.1)
POTASSIUM: 3.5 mmol/L (ref 3.5–5.1)
POTASSIUM: 3.6 mmol/L (ref 3.5–5.1)
SODIUM: 138 mmol/L (ref 135–145)
SODIUM: 137 mmol/L (ref 135–145)
SODIUM: 138 mmol/L (ref 135–145)

## 2016-04-23 LAB — MAGNESIUM: MAGNESIUM: 1.7 mg/dL (ref 1.7–2.4)

## 2016-04-23 LAB — PROCALCITONIN: Procalcitonin: <0.1 ng/mL

## 2016-04-23 MED ORDER — PANTOPRAZOLE SODIUM 40 MG PO TBEC
40.0000 mg | DELAYED_RELEASE_TABLET | Freq: Every day | ORAL | Status: DC
  Administered 2016-04-23 – 2016-04-27 (×5): 40 mg via ORAL
  Filled 2016-04-23 (×5): qty 1

## 2016-04-23 MED ORDER — IPRATROPIUM-ALBUTEROL 0.5-2.5 (3) MG/3ML IN SOLN
3.0000 mL | Freq: Four times a day (QID) | RESPIRATORY_TRACT | Status: DC
  Administered 2016-04-23 – 2016-04-24 (×7): 3 mL via RESPIRATORY_TRACT
  Filled 2016-04-23 (×6): qty 3

## 2016-04-23 MED ORDER — ONDANSETRON 4 MG PO TBDP
4.0000 mg | ORAL_TABLET | Freq: Once | ORAL | Status: AC
  Administered 2016-04-23: 4 mg via ORAL
  Filled 2016-04-23: qty 1

## 2016-04-23 MED ORDER — POTASSIUM CHLORIDE CRYS ER 20 MEQ PO TBCR
40.0000 meq | EXTENDED_RELEASE_TABLET | Freq: Once | ORAL | Status: AC
  Administered 2016-04-23: 40 meq via ORAL
  Filled 2016-04-23: qty 2

## 2016-04-23 NOTE — Progress Notes (Signed)
RN removed BiPAP from patient around 0215 and placed patient back on 10L high flow nasal cannula, patient is tolerating well with SpO2 of 92%. RT will continue to monitor as needed.

## 2016-04-23 NOTE — Progress Notes (Signed)
  Subjective: Feeling better this morning, waiting on her Creon to eat breakfast. Tolerated high-flow [place] most of the day yesterday.  Wore BiPap overnight but has been back on [place] since about 2AM.  Currently SpO2 94%. Feels that a breathing treatment would help.  She has had these during prior hospitalizations per her report.  Objective:  Vital signs in last 24 hours: Vitals:   04/22/16 2301 04/23/16 0012 04/23/16 0420 04/23/16 0615  BP:  125/86 (!) 135/94   Pulse:      Resp:      Temp:  97.8 F (36.6 C) 97.6 F (36.4 C)   TempSrc:  Axillary Oral   SpO2: 94%     Weight:    191 lb 6.4 oz (86.8 kg)  Height:       Physical Exam  Constitutional: She is oriented to person, place, and time. She appears well-developed. No distress.  Chronically ill appearing woman, sitting up in bed.  Cardiovascular: Normal rate and regular rhythm.   No murmur heard. Pulmonary/Chest:  On high flow [employer], speaking in full sentences, left lung CTA, right lung with fine crackles, no wheezes.  Abdominal: Soft. She exhibits no distension. There is no tenderness.  Neurological: She is alert and oriented to person, place, and time.  Skin: Skin is warm and dry.    Assessment/Plan: Pt is a 56 y.o. yo female with a PMHx of recurrent acute pancreatitis, alcohol use disorder who was admitted on 04/21/2016 with symptoms of shortness of breath, which was determined to be secondary to ARDS.   Principal Problem:   ARDS (adult respiratory distress syndrome) (HCC) Active Problems:   History of pancreatitis   Asthma   S/P IVC filter   S/P cholecystectomy   Tobacco abuse   Alcohol abuse   Community acquired pneumonia of left lower lobe of lung (HCC)   Hypokalemia  ARDS  Acute hypoxic respiratory panel  Flu PCR negative but given multiple sick contacts and reported history of COPD, continuing tamiflu.  Procalcitonin 0.24 >> 0.10.   -goal SpO2 >88%, will resume bipap if needed.   -continue droplet precautions, her  flu PCR may be falsely negative  - follow up chest xray with improved left and worsened right aeration with interstitial opacities -blood cultures no growth  -continue tamiflu, ceftriaxone, and azithromycin  -Duoneb q6h while awake  Hypokalemia  K improving with repletion.  Last measurement normal - follow up BMP and Mag.   Will need influenza vaccination at the time of discharge or hospital follow up   Dispo: Anticipated discharge in approximately 3-5 day(s).   Gwynn BurlyAndrew Emilygrace Grothe, DO 04/23/2016, 8:39 AM Pager: [phone]

## 2016-04-24 DIAGNOSIS — J1100 Influenza due to unidentified influenza virus with unspecified type of pneumonia: Secondary | ICD-10-CM

## 2016-04-24 DIAGNOSIS — R0600 Dyspnea, unspecified: Secondary | ICD-10-CM

## 2016-04-24 LAB — FOLATE RBC
FOLATE, HEMOLYSATE: 250.6 ng/mL
FOLATE, RBC: 692 ng/mL (ref >498)
HEMATOCRIT: 36.2 % (ref 34.0–46.6)

## 2016-04-24 LAB — MAGNESIUM: Magnesium: 1.3 mg/dL — ABNORMAL LOW (ref 1.7–2.4)

## 2016-04-24 LAB — BASIC METABOLIC PANEL
Anion gap: 8 (ref 5–15)
CHLORIDE: 106 mmol/L (ref 101–111)
CO2: 23 mmol/L (ref 22–32)
Calcium: 8.3 mg/dL — ABNORMAL LOW (ref 8.9–10.3)
Creatinine, Ser: 0.82 mg/dL (ref 0.44–1.00)
GFR calc Af Amer: >60 mL/min (ref >60)
GFR calc non Af Amer: >60 mL/min (ref >60)
GLUCOSE: 94 mg/dL (ref 65–99)
Potassium: 3.3 mmol/L — ABNORMAL LOW (ref 3.5–5.1)
SODIUM: 137 mmol/L (ref 135–145)

## 2016-04-24 MED ORDER — IPRATROPIUM-ALBUTEROL 0.5-2.5 (3) MG/3ML IN SOLN
3.0000 mL | Freq: Three times a day (TID) | RESPIRATORY_TRACT | Status: DC
  Administered 2016-04-25 – 2016-04-26 (×4): 3 mL via RESPIRATORY_TRACT
  Filled 2016-04-24 (×4): qty 3

## 2016-04-24 MED ORDER — IPRATROPIUM-ALBUTEROL 0.5-2.5 (3) MG/3ML IN SOLN: 3.0000 mL | RESPIRATORY_TRACT | Status: DC | PRN

## 2016-04-24 MED ORDER — POTASSIUM CHLORIDE CRYS ER 20 MEQ PO TBCR
40.0000 meq | EXTENDED_RELEASE_TABLET | Freq: Once | ORAL | Status: AC
  Administered 2016-04-24: 40 meq via ORAL
  Filled 2016-04-24: qty 2

## 2016-04-24 MED ORDER — MAGNESIUM SULFATE 2 GM/50ML IV SOLN
2.0000 g | Freq: Once | INTRAVENOUS | Status: AC
  Administered 2016-04-24: 2 g via INTRAVENOUS
  Filled 2016-04-24: qty 50

## 2016-04-24 NOTE — Progress Notes (Signed)
  Subjective: [name] says she is feeling better today. She is tired of sitting in bed but continues to feel short of breath with exertion. She was on 10 L [place] with SpO2 94% when we initially walked into the room, attempted wean to 5L and SpO2 decreased to 88%.   Objective:  Vital signs in last 24 hours: Vitals:   04/24/16 0141 04/24/16 0539 04/24/16 0759 04/24/16 1102  BP:  (!) 132/91  (!) 130/98  Pulse: 80 86  82  Resp: 15 19  17   Temp:  98.1 F (36.7 C)  98 F (36.7 C)  TempSrc:  Oral  Oral  SpO2: 95% 93% 93% 96%  Weight:  185 lb 14.4 oz (84.3 kg)    Height:       Physical Exam  Constitutional: She is oriented to person, place, and time. She appears well-nourished. No distress.  Chronically ill appearing woman   Cardiovascular: Normal rate and regular rhythm.   No murmur heard. Pulmonary/Chest: Effort normal and breath sounds normal. No respiratory distress.  On high flow nasal canula speaking in full sentences   Abdominal: Soft. She exhibits no distension. There is no tenderness.  Neurological: She is alert and oriented to person, place, and time.  Skin: Skin is warm and dry. She is not diaphoretic.    Assessment/Plan: Pt is a 56 y.o. yo female with a PMHx of recurrent acute pancreatitis, alcohol use disorder who was admitted on 04/21/2016 with symptoms of shortness of breath, which was determined to be secondary to ARDS.   Principal Problem:   ARDS (adult respiratory distress syndrome) (HCC) Active Problems:   History of pancreatitis   Asthma   S/P IVC filter   S/P cholecystectomy   Tobacco abuse   Alcohol abuse   Community acquired pneumonia of left lower lobe of lung (HCC)   Hypokalemia   Acute respiratory failure with hypoxia (HCC)   Dyspnea  ARDS  Acute hypoxic respiratory panel  Procalcitonin 0.24 >> <0.10. Has been tolerating high flow nasal canula without need for BiPAP in over 24 hours.  -goal SpO2 >92%, will resume bipap if needed.    -PT  eval when she is requiring less oxygen (<5L) and better able to tolerate ambulation -continue droplet precautions, her flu PCR may be falsely negative  - follow up chest xray tomorrow morning  -follow up blood cultures  - continue tamiflu, ceftriaxone, and azithromycin  -continue Bipap as tolerated    Hypokalemia  K improving with repletion. Low mag today, repleated.  - follow up BMP and Mag.   Will need influenza vaccination at the time of discharge or hospital follow up   Dispo: Anticipated discharge in approximately 3-5 day(s).   Eulah PontNina Michaeljames Milnes, MD 04/24/2016, 11:15 AM Pager: [phone]

## 2016-04-24 NOTE — Progress Notes (Signed)
Patient tolerating HFNC on 10 L at this time. RT made pt aware that if she felt SOB or distress and needed BIPAP to call and we would place her on at anytime. Pt sitting comfortably in bed at this time. No distress noted.

## 2016-04-24 NOTE — Plan of Care (Signed)
Problem: Physical Regulation: Goal: Will remain free from infection Outcome: Progressing Pt tolerating up to chair today with standby assistance,

## 2016-04-24 NOTE — Progress Notes (Signed)
Pt up to chair from 1000 am till 1200 without difficulty, no complaints of discomfort or pain, able to transfer to chair and back to bed with standby assist, resting comfortably in bed at present. Raymon MuttonGwen Eliaz Fout RN

## 2016-04-25 DIAGNOSIS — R7989 Other specified abnormal findings of blood chemistry: Secondary | ICD-10-CM

## 2016-04-25 LAB — BASIC METABOLIC PANEL
ANION GAP: 7 (ref 5–15)
BUN: <5 mg/dL — ABNORMAL LOW (ref 6–20)
CO2: 23 mmol/L (ref 22–32)
Calcium: 8 mg/dL — ABNORMAL LOW (ref 8.9–10.3)
Chloride: 105 mmol/L (ref 101–111)
Creatinine, Ser: 0.67 mg/dL (ref 0.44–1.00)
GFR calc Af Amer: >60 mL/min (ref >60)
GLUCOSE: 93 mg/dL (ref 65–99)
POTASSIUM: 3.5 mmol/L (ref 3.5–5.1)
Sodium: 135 mmol/L (ref 135–145)

## 2016-04-25 LAB — PROCALCITONIN: Procalcitonin: 2.91 ng/mL

## 2016-04-25 LAB — MAGNESIUM: Magnesium: 1.5 mg/dL — ABNORMAL LOW (ref 1.7–2.4)

## 2016-04-25 MED ORDER — PSEUDOEPHEDRINE HCL ER 120 MG PO TB12
120.0000 mg | ORAL_TABLET | Freq: Two times a day (BID) | ORAL | Status: DC
  Administered 2016-04-25 – 2016-04-26 (×3): 120 mg via ORAL
  Filled 2016-04-25 (×3): qty 1

## 2016-04-25 MED ORDER — POTASSIUM CHLORIDE CRYS ER 20 MEQ PO TBCR
40.0000 meq | EXTENDED_RELEASE_TABLET | Freq: Once | ORAL | Status: AC
  Administered 2016-04-25: 40 meq via ORAL
  Filled 2016-04-25: qty 2

## 2016-04-25 MED ORDER — MAGNESIUM SULFATE 4 GM/100ML IV SOLN
4.0000 g | Freq: Once | INTRAVENOUS | Status: AC
  Administered 2016-04-25: 4 g via INTRAVENOUS
  Filled 2016-04-25: qty 100

## 2016-04-25 NOTE — Progress Notes (Addendum)
  Subjective: Ms. Earnestine MealingVictoria Berkel says she is feeling better today. She is feeling better now that she is sitting up in the chair and able to tolerate walking around in her room without feeling short of breath. She does complain of the feeling of chest congestion today. She was on 8 L [place] with SpO2 95% when we initially walked into the room, weaned to 6L and SpO2 decreased to 94%.   Objective:  Vital signs in last 24 hours: Vitals:   04/24/16 1456 04/24/16 1700 04/25/16 0520 04/25/16 0752  BP:  132/83  128/81  Pulse:  89 92 91  Resp:  18 19 18   Temp:  98.1 F (36.7 C) 98.3 F (36.8 C) 98.1 F (36.7 C)  TempSrc:  Oral Oral Oral  SpO2: 90% 94% 95% 97%  Weight:   183 lb 11.2 oz (83.3 kg)   Height:       Physical Exam  Constitutional: She is oriented to person, place, and time. She appears well-nourished. No distress.  Chronically ill appearing woman   Cardiovascular: Normal rate and regular rhythm.   No murmur heard. Pulmonary/Chest: Effort normal and breath sounds normal. No respiratory distress.  On high flow nasal canula speaking in full sentences   Abdominal: Soft. She exhibits no distension. There is no tenderness.  Neurological: She is alert and oriented to person, place, and time.  Skin: Skin is warm and dry. She is not diaphoretic.    Assessment/Plan: Pt is a 56 y.o. yo female with a PMHx of recurrent acute pancreatitis, alcohol use disorder who was admitted on 04/21/2016 with symptoms of shortness of breath, which was determined to be secondary to ARDS.   Principal Problem:   ARDS (adult respiratory distress syndrome) (HCC) Active Problems:   History of pancreatitis   Asthma   S/P IVC filter   S/P cholecystectomy   Tobacco abuse   Alcohol abuse   Community acquired pneumonia of left lower lobe of lung (HCC)   Hypokalemia   Acute respiratory failure with hypoxia (HCC)   Dyspnea  ARDS  Acute hypoxic respiratory panel  Procalcitonin 0.24 > <0.10 > 2.9 does not  correlate with this clinical picture of improvement in her symptoms and oxygen requirement since admission. Has been tolerating high flow nasal canula without need for BiPAP in over 2 days.  -goal SpO2 >92%, will resume bipap if needed.    -PT eval when she is requiring less oxygen (<5L) and better able to tolerate ambulation -continue droplet precautions, her flu PCR may be falsely negative, tamiflu day 4/5  - completed 5 day course of ceftriaxone and azithromycin  -follow up blood cultures >> NGTD   -continue tamiflu, ceftriaxone, and azithromycin   Congestion  -ordered robitussin   Hypokalemia, Low magnesium  Low magnesium repleated.  - follow up BMP and Mag.   Will need influenza vaccination at the time of discharge or hospital follow up   Dispo: Anticipated discharge in approximately 2-4 day(s).   Eulah PontNina Buckley Bradly, MD 04/25/2016, 11:28 AM Pager: [phone]

## 2016-04-26 DIAGNOSIS — Z9981 Dependence on supplemental oxygen: Secondary | ICD-10-CM

## 2016-04-26 DIAGNOSIS — R0989 Other specified symptoms and signs involving the circulatory and respiratory systems: Secondary | ICD-10-CM

## 2016-04-26 DIAGNOSIS — R0781 Pleurodynia: Secondary | ICD-10-CM

## 2016-04-26 DIAGNOSIS — J80 Acute respiratory distress syndrome: Secondary | ICD-10-CM

## 2016-04-26 DIAGNOSIS — Z8639 Personal history of other endocrine, nutritional and metabolic disease: Secondary | ICD-10-CM

## 2016-04-26 LAB — BASIC METABOLIC PANEL
Anion gap: 8 (ref 5–15)
CALCIUM: 8.1 mg/dL — AB (ref 8.9–10.3)
CO2: 23 mmol/L (ref 22–32)
CREATININE: 0.61 mg/dL (ref 0.44–1.00)
Chloride: 103 mmol/L (ref 101–111)
GFR calc non Af Amer: >60 mL/min (ref >60)
Glucose, Bld: 98 mg/dL (ref 65–99)
Potassium: 3.4 mmol/L — ABNORMAL LOW (ref 3.5–5.1)
SODIUM: 134 mmol/L — AB (ref 135–145)

## 2016-04-26 LAB — CULTURE, BLOOD (ROUTINE X 2)
CULTURE: NO GROWTH
CULTURE: NO GROWTH

## 2016-04-26 LAB — MAGNESIUM: MAGNESIUM: 1.9 mg/dL (ref 1.7–2.4)

## 2016-04-26 MED ORDER — PSEUDOEPHEDRINE HCL ER 120 MG PO TB12
120.0000 mg | ORAL_TABLET | Freq: Two times a day (BID) | ORAL | Status: DC | PRN
  Filled 2016-04-26: qty 1

## 2016-04-26 MED ORDER — KETOROLAC TROMETHAMINE 10 MG PO TABS
10.0000 mg | ORAL_TABLET | Freq: Four times a day (QID) | ORAL | Status: DC | PRN
  Administered 2016-04-26 – 2016-04-27 (×3): 10 mg via ORAL
  Filled 2016-04-26 (×6): qty 1

## 2016-04-26 MED ORDER — POTASSIUM CHLORIDE CRYS ER 20 MEQ PO TBCR
40.0000 meq | EXTENDED_RELEASE_TABLET | Freq: Once | ORAL | Status: AC
  Administered 2016-04-26: 40 meq via ORAL
  Filled 2016-04-26: qty 2

## 2016-04-26 NOTE — Progress Notes (Signed)
  Subjective: Sharon Kent is feeling well today except for some rib pain related to her cough. She was on 8 L [place] with SpO2 98% when we initially walked into the room, weaned to 5L and SpO2 decreased to 94%.   Objective:  Vital signs in last 24 hours: Vitals:   04/26/16 0556 04/26/16 0700 04/26/16 0830 04/26/16 0837  BP: (!) 148/106 (!) 143/101    Pulse: 74 76    Resp:      Temp:  97.9 F (36.6 C)    TempSrc:  Oral    SpO2: 98% 98% 96% 93%  Weight: 183 lb 3.2 oz (83.1 kg)     Height:       Physical Exam  Constitutional: She is oriented to person, place, and time. She appears well-developed and well-nourished. No distress.  Cardiovascular: Normal rate and regular rhythm.   No murmur heard. Pulmonary/Chest: Effort normal. No respiratory distress. She has rales.  On high flow nasal canula speaking in full sentences  Diffuse crackles over both lung fields  Left lower rib pain   Abdominal: Soft. She exhibits no distension. There is no tenderness.  Neurological: She is alert and oriented to person, place, and time.  Skin: Skin is warm and dry. She is not diaphoretic.    Assessment/Plan: Pt is a 56 y.o. yo female with a PMHx of recurrent acute pancreatitis, alcohol use disorder who was admitted on 04/21/2016 with symptoms of shortness of breath, which was determined to be secondary to ARDS.   Principal Problem:   ARDS (adult respiratory distress syndrome) (HCC) Active Problems:   History of pancreatitis   Asthma   S/P IVC filter   S/P cholecystectomy   Tobacco abuse   Alcohol abuse   Community acquired pneumonia of left lower lobe of lung (HCC)   Hypokalemia   Acute respiratory failure with hypoxia (HCC)   Dyspnea  ARDS  Acute hypoxic respiratory failure  Has had significant improvement in her clinical status and symptoms since presentation, she can be transferred to med-surg level of care today. Was initially requiring bipap but has been weaned to 5 L nasal canula.  Has completed 5 day course of tamiflu, ceftriaxone, and azithromycin  -goal SpO2 >92%, will continue to wean O2 and will resume bipap if needed.    -PT eval  -continue droplet precautions -follow up blood cultures >> NGTD   -incentive spirometry   Congestion  -ordered robitussin   Hypokalemia, Low magnesium  Low magnesium repleated.  - follow up BMP and Mag.   Will need influenza vaccination at the time of discharge or hospital follow up   Dispo: Anticipated discharge in approximately 2-4 day(s).   Sharon PontNina Makynzee Tigges, MD 04/26/2016, 1:20 PM Pager: [phone]

## 2016-04-27 DIAGNOSIS — J159 Unspecified bacterial pneumonia: Secondary | ICD-10-CM

## 2016-04-27 DIAGNOSIS — K861 Other chronic pancreatitis: Secondary | ICD-10-CM

## 2016-04-27 DIAGNOSIS — F17200 Nicotine dependence, unspecified, uncomplicated: Secondary | ICD-10-CM

## 2016-04-27 DIAGNOSIS — J069 Acute upper respiratory infection, unspecified: Secondary | ICD-10-CM

## 2016-04-27 DIAGNOSIS — F1010 Alcohol abuse, uncomplicated: Secondary | ICD-10-CM

## 2016-04-27 LAB — BASIC METABOLIC PANEL
Anion gap: 7 (ref 5–15)
CALCIUM: 8.3 mg/dL — AB (ref 8.9–10.3)
CO2: 25 mmol/L (ref 22–32)
Chloride: 104 mmol/L (ref 101–111)
Creatinine, Ser: 0.67 mg/dL (ref 0.44–1.00)
GFR calc Af Amer: >60 mL/min (ref >60)
GLUCOSE: 96 mg/dL (ref 65–99)
POTASSIUM: 3.6 mmol/L (ref 3.5–5.1)
Sodium: 136 mmol/L (ref 135–145)

## 2016-04-27 LAB — MAGNESIUM: Magnesium: 1.6 mg/dL — ABNORMAL LOW (ref 1.7–2.4)

## 2016-04-27 MED ORDER — MAGNESIUM SULFATE 2 GM/50ML IV SOLN: 2.0000 g | Freq: Once | INTRAVENOUS | Status: DC

## 2016-04-27 MED ORDER — AMLODIPINE BESYLATE 10 MG PO TABS
10.0000 mg | ORAL_TABLET | Freq: Every day | ORAL | Status: DC
  Administered 2016-04-27: 10 mg via ORAL
  Filled 2016-04-27: qty 1

## 2016-04-27 MED ORDER — MAGNESIUM CHLORIDE 64 MG PO TBEC
1.0000 | DELAYED_RELEASE_TABLET | Freq: Once | ORAL | Status: DC
Start: 2016-04-27 — End: 2016-04-27
  Filled 2016-04-27: qty 1

## 2016-04-27 MED ORDER — POTASSIUM CHLORIDE CRYS ER 20 MEQ PO TBCR: 20.0000 meq | EXTENDED_RELEASE_TABLET | Freq: Two times a day (BID) | ORAL | 0 refills | Status: DC

## 2016-04-27 NOTE — Evaluation (Signed)
Physical Therapy Evaluation Patient Details Name: Sharon Kent MRN: [id number] DOB: February 01, 1961 Today's Date: 04/27/2016   History of Present Illness  pt presents with ARDS.  pt with hx of Pancreatitis, Etoh, IVC Filter, and NICM with [place]ubo.    Clinical Impression  Pt moving well and has been up independently around the room.  Pt able to ambulate in the hallways with O2 sats being 94% at rest on RA, decreases to 85% on RA with ambulation, increases to 95% on 3L O2 with ambulation.  No PT needs at this time.  Will sign off.      Follow Up Recommendations No PT follow up;Supervision - Intermittent    Equipment Recommendations  None recommended by PT    Recommendations for Other Services       Precautions / Restrictions Precautions Precautions: None Restrictions Weight Bearing Restrictions: No      Mobility  Bed Mobility Overal bed mobility: Independent                Transfers Overall transfer level: Independent Equipment used: None                Ambulation/Gait Ambulation/Gait assistance: Modified independent (Device/Increase time) Ambulation Distance (Feet): 200 Feet Assistive device: None Gait Pattern/deviations: Step-through pattern;Decreased stride length     General Gait Details: pt does well pacing herself due to SOB with ambulation on RA.    Stairs            Wheelchair Mobility    Modified Rankin (Stroke Patients Only)       Balance Overall balance assessment: Modified Independent                                           Pertinent Vitals/Pain Pain Assessment: No/denies pain    Home Living Family/patient expects to be discharged to:: Private residence Living Arrangements: Alone Available Help at Discharge: Family;Friend(s);Available PRN/intermittently Type of Home: Apartment Home Access: Stairs to enter Entrance Stairs-Rails: Doctor, general practiceight;Left Entrance Stairs-Number of Steps: Flight Home Layout: One level  (2nd floor apartment) Home Equipment: None Additional Comments: pt just moved into a 2nd floor apartment.  pt has not moved her furniture yet from her house in [place] and has been sleeping on an air mattress.  pt indicates her boss and her old roommate from [place] can help her if needed.      Prior Function Level of Independence: Independent               Hand Dominance        Extremity/Trunk Assessment   Upper Extremity Assessment Upper Extremity Assessment: Overall WFL for tasks assessed    Lower Extremity Assessment Lower Extremity Assessment: Overall WFL for tasks assessed    Cervical / Trunk Assessment Cervical / Trunk Assessment: Normal  Communication   Communication: No difficulties  Cognition Arousal/Alertness: Awake/alert Behavior During Therapy: WFL for tasks assessed/performed Overall Cognitive Status: Within Functional Limits for tasks assessed                      General Comments      Exercises     Assessment/Plan    PT Assessment Patent does not need any further PT services  PT Problem List            PT Treatment Interventions      PT Goals (Current goals can be found in  the Care Plan section)  Acute Rehab PT Goals Patient Stated Goal: without getting SOB. PT Goal Formulation: All assessment and education complete, DC therapy    Frequency     Barriers to discharge        Co-evaluation               End of Session Equipment Utilized During Treatment: Gait belt;Oxygen Activity Tolerance: Patient tolerated treatment well Patient left: in bed;with call bell/phone within reach Nurse Communication: Mobility status         Time: 0901-0920 PT Time Calculation (min) (ACUTE ONLY): 19 min   Charges:   PT Evaluation $PT Eval Moderate Complexity: 1 Procedure     PT G CodesAlison Murray Normal Recinos, PT  [phone] 04/27/2016, 11:21 AM

## 2016-04-27 NOTE — Discharge Summary (Signed)
Name: Sharon Kent MRN: [id number] DOB: 10/26/1960 56 y.o. PCP: Pcp Not In System  Date of Admission: 04/21/2016  8:15 AM Date of Discharge: 04/27/2016 Attending Physician: Gust Rung, DO  Discharge Diagnosis: 1.    ARDS (adult respiratory distress syndrome) (HCC)   Community acquired pneumonia of left lower lobe of lung (HCC)  Discharge Medications: Allergies as of 04/27/2016      Reactions   Morphine Nausea Only   Oxycodone-acetaminophen Other (See Comments)   Makes her "loopy."      Medication List    TAKE these medications   acetaminophen 325 MG tablet Commonly known as:  TYLENOL Take 650 mg by mouth every 6 (six) hours as needed for moderate pain.   amLODipine 10 MG tablet Commonly known as:  NORVASC Take 10 mg by mouth daily.   esomeprazole 20 MG capsule Commonly known as:  NEXIUM Take 20 mg by mouth daily before breakfast.   ibuprofen 200 MG tablet Commonly known as:  ADVIL,MOTRIN Take 400 mg by mouth every 6 (six) hours as needed.   labetalol 100 MG tablet Commonly known as:  NORMODYNE Take 200 mg by mouth 2 (two) times daily.   lipase/protease/amylase 40981 units Cpep capsule Commonly known as:  CREON Take 24,000 Units by mouth 3 (three) times daily with meals.   multivitamin with minerals tablet Take 1 tablet by mouth daily.   pantoprazole 40 MG tablet Commonly known as:  PROTONIX Take 40 mg by mouth daily.   potassium chloride SA 20 MEQ tablet Commonly known as:  K-DUR,KLOR-CON Take 1 tablet (20 mEq total) by mouth 2 (two) times daily.   ROBITUSSIN COUGH+CHEST CONG DM 5-100 MG/5ML Liqd Generic drug:  Dextromethorphan-Guaifenesin Take 20 mLs by mouth daily as needed (cough).       Disposition and follow-up:   [name] was discharged from [facility] in Stable condition.  At the hospital follow up visit please address:  1.  Acute hypoxic respiratory failure - discharged on home oxygen, can this be  discontinued?  Please administer influenza vaccination.   2.  Labs / imaging needed at time of follow-up: none   3.  Pending labs/ test needing follow-up: none   Follow-up Appointments: Follow-up Information    [facility]. Schedule an appointment as soon as possible for a visit in 1 week(s).   Contact information: [address] [place] [address] [phone]       [facility] Follow up.   Why:  oxygen Contact information: [address] [place] [address] [phone]           Hospital Course by problem list:    ARDS (adult respiratory distress syndrome) (HCC)   Acute respiratory failure with hypoxia (HCC)   Community acquired pneumonia of left lower lobe of lung (HCC)   Asthma 56 year old woman with PMH recurrent acute pancreatitis s/p partial pancreatectomy and cholecystectomy, asthma, tobacco use and s/p IVE filter placed 12/2010 presented with shortness of breath, productive cough, decreased appetite, fever and chills. She was afebrile with stable vitals but SpO2 87% on 8 liters [place] oxygen and had course left side crackles. Chest xray revealed diffuse left sided reticular opacities. Labs revealed K <2, bicarb 23 with anion gap 16, ABG pH 7.38, CO2 36.6. She was placed on BiPAP with 50% FiO2. She was started on nebulizers and potassium and mag supplementation. Completed 5 day course of tamiflu, ceftriaxone, azithromycin. She  required approximately one day of BiPap then was tapered to high flow nasal canula and then very gradually weaned to 3 L nasal cannula. On the day of discharge SpO2 was 97% on 3L at rest and she was no longer feeling dyspnea on exertion. She became hypoxic to 85% when ambulating on room air but recovered to 95% when placed on 3L [place]. She had medium crackles diffusely and wheeze over left lung fields. Blood cultures showed no growth for 5 days. She was discharged with home oxygen and a pulse ox  monitor with instructions to keep SpO2 greater than 93%.     History of pancreatitis Continued home med Creon.     Hypokalemia Managed with multiple doses of K-Dur. K 3.6 on the day of discharge.     Tobacco abuse   Alcohol abuse Counseled on cessation.   Discharge Vitals:   BP (!) 151/105 (BP Location: Left Arm)   Pulse 84   Temp 98.4 F (36.9 C) (Oral)   Resp 15   Ht 5\' 6"  (1.676 m)   Wt 180 lb 6.4 oz (81.8 kg)   SpO2 97%   BMI 29.12 kg/m   Pertinent Labs, Studies, and Procedures:  Procedures Performed:  Dg Chest Port 1 View  Result Date: 04/23/2016 CLINICAL DATA:  ARDS. Pt c/o chest tightness. Asthma. 1 image. EXAM: PORTABLE CHEST 1 VIEW COMPARISON:  One day prior FINDINGS: Midline trachea. Borderline cardiomegaly. Atherosclerosis in the transverse aorta. Right hemidiaphragm elevation. Possible small right pleural effusion. No pneumothorax. Shifting interstitial opacities. Slight improvement in left-sided aeration with new inferior right upper lobe interstitial opacity. IMPRESSION: Improved left and worsened right aeration with interstitial opacities. Favor pulmonary edema or especially given the clinical history, ARDS. Multifocal infection could look similar. Possible small right pleural effusion. Aortic atherosclerosis. Electronically Signed   By: Jeronimo GreavesKyle  Talbot M.D.   On: 04/23/2016 07:24   Dg Chest Portable 1 View  Result Date: 04/21/2016 CLINICAL DATA:  Shortness of breath. Decreased O2 saturation. Productive cough. EXAM: PORTABLE CHEST 1 VIEW COMPARISON:  None. FINDINGS: There is a diffuse interstitial infiltrate in the left lung. Slight elevation of the lateral aspect of the right hemidiaphragm. No infiltrates on the right. Heart size and pulmonary vascularity are normal. Slight thoracic scoliosis. IMPRESSION: Extensive interstitial infiltrate in the left lung. Aortic atherosclerosis. Electronically Signed   By: Francene BoyersJames  Maxwell M.D.   On: 04/21/2016 09:26     Discharge  Instructions: Discharge Instructions    Call MD for:  difficulty breathing, headache or visual disturbances    Complete by:  As directed    Call MD for:  extreme fatigue    Complete by:  As directed    Call MD for:  temperature >100.4    Complete by:  As directed    Diet - low sodium heart healthy    Complete by:  As directed    Increase activity slowly    Complete by:  As directed       Signed: Eulah PontNina Sunni Richardson, MD 05/02/2016, 7:04 PM   Pager: [phone]

## 2016-04-27 NOTE — Care Management Note (Signed)
Case Management Note  Patient Details  Name: Sharon MealingVictoria Kent MRN: [id number] Date of Birth: 1960/05/22  Subjective/Objective: Pt presented for Adult Respiratory Distress Syndrome. Plan for home with 02. Pt has [employer] insurance. CM did call [employer] and 02 approved Intake Reference # is [id number].                    Action/Plan: [employer] stated ok to use [facility] for DME. CM did make referral with Carollee HerterShannon and DME to be delivered to room before d/c. CM did ask CSW for cab voucher for home. Pt is aware to call [employer] for PCP in network information.  No further needs from CM at this time.    Expected Discharge Date:  04/27/16               Expected Discharge Plan:  Home/Self Care  In-House Referral:  NA  Discharge planning Services  CM Consult  Post Acute Care Choice:  Durable Medical Equipment Choice offered to:  Patient  DME Arranged:  Oxygen DME Agency:  [facility]  HH Arranged:  NA HH Agency:  NA  Status of Service:  Completed, signed off  If discussed at Long Length of Stay Meetings, dates discussed:    Additional Comments:  Gala LewandowskyGraves-Bigelow, Roma Bondar Kaye, RN 04/27/2016, 1:46 PM

## 2016-04-27 NOTE — Progress Notes (Signed)
SATURATION QUALIFICATIONS: (This note is used to comply with regulatory documentation for home oxygen)  Patient Saturations on Room Air at Rest = 94%  Patient Saturations on Room Air while Ambulating = 85%  Patient Saturations on 3 Liters of oxygen while Ambulating = 95%  Please briefly explain why patient needs home oxygen:

## 2016-04-27 NOTE — Progress Notes (Signed)
  Subjective: Ms. Earnestine MealingVictoria Anger is feeling well today. Her rib pain has improved and she has been walking around her room without difficulty breathing. Dyspnea was challenged with ambulation today.   Objective:  Vital signs in last 24 hours: Vitals:   04/26/16 0837 04/26/16 1700 04/26/16 2043 04/27/16 0611  BP:   (!) 155/99 (!) 151/105  Pulse:  85 84 84  Resp:      Temp:  98 F (36.7 C) 98.3 F (36.8 C) 98.4 F (36.9 C)  TempSrc:  Oral Oral Oral  SpO2: 93%  95% 97%  Weight:    180 lb 6.4 oz (81.8 kg)  Height:       Physical Exam  Constitutional: She is oriented to person, place, and time. She appears well-developed and well-nourished. No distress.  Cardiovascular: Normal rate and regular rhythm.   No murmur heard. Pulmonary/Chest: Effort normal. No respiratory distress. She has wheezes. She has rales.  On 3L [place] speaking in full sentences  Wheeze over left lung field, diffuse crackles improving  Abdominal: Soft. She exhibits no distension. There is no tenderness.  Neurological: She is alert and oriented to person, place, and time.  Skin: Skin is warm and dry. She is not diaphoretic.    Assessment/Plan: Pt is a 56 y.o. yo female with a PMHx of recurrent acute pancreatitis, alcohol use disorder who was admitted on 04/21/2016 with symptoms of shortness of breath, which was determined to be secondary to ARDS.   Principal Problem:   ARDS (adult respiratory distress syndrome) (HCC) Active Problems:   History of pancreatitis   Asthma   S/P IVC filter   S/P cholecystectomy   Tobacco abuse   Alcohol abuse   Community acquired pneumonia of left lower lobe of lung (HCC)   Hypokalemia   Acute respiratory failure with hypoxia (HCC)   Dyspnea  ARDS  Acute hypoxic respiratory failure  Has had significant improvement in her clinical status and symptoms since presentation, she has SpO2 97% on 3L at rest. Became hypoxic to 85% when ambulating on room air but recovered to 95% when  placed on 3L [place]. We discussed the possibility of her going home with aid but she feels that the out of pocket expense would be too significant with her insurance. She feels ready to go home with home oxygen.  -goal SpO2 >92%, will continue to wean O2 and will resume bipap if needed.    -incentive spirometry  -blood cx final, no growth   Hypokalemia, Low magnesium  Low magnesium, repleated.  Will need influenza vaccination at the time of discharge or hospital follow up   Dispo: Anticipated discharge today   Eulah PontNina Gudrun Axe, MD 04/27/2016, 12:37 PM Pager: [phone]

## 2016-05-03 ENCOUNTER — Telehealth: Payer: Self-pay

## 2016-05-03 NOTE — Telephone Encounter (Signed)
APT. REMINDER CALL, LMTCB °

## 2016-05-04 ENCOUNTER — Ambulatory Visit (INDEPENDENT_AMBULATORY_CARE_PROVIDER_SITE_OTHER): Payer: Managed Care, Other (non HMO) | Admitting: Internal Medicine

## 2016-05-04 DIAGNOSIS — Z09 Encounter for follow-up examination after completed treatment for conditions other than malignant neoplasm: Secondary | ICD-10-CM | POA: Diagnosis not present

## 2016-05-04 DIAGNOSIS — Z8709 Personal history of other diseases of the respiratory system: Secondary | ICD-10-CM | POA: Diagnosis not present

## 2016-05-04 DIAGNOSIS — I5032 Chronic diastolic (congestive) heart failure: Secondary | ICD-10-CM

## 2016-05-04 DIAGNOSIS — Z9049 Acquired absence of other specified parts of digestive tract: Secondary | ICD-10-CM

## 2016-05-04 DIAGNOSIS — J189 Pneumonia, unspecified organism: Secondary | ICD-10-CM

## 2016-05-04 DIAGNOSIS — Z8701 Personal history of pneumonia (recurrent): Secondary | ICD-10-CM | POA: Diagnosis not present

## 2016-05-04 DIAGNOSIS — Z87891 Personal history of nicotine dependence: Secondary | ICD-10-CM

## 2016-05-04 DIAGNOSIS — J181 Lobar pneumonia, unspecified organism: Principal | ICD-10-CM

## 2016-05-04 NOTE — Patient Instructions (Addendum)
[name],   Bonita QuinYou are recovering well from the pneumonia. Your oxygenation is much improved from the hospitalization. You no longer need the oxygen at home. You were very sick and it will take you several weeks to fully recover you strength.  I would like you to follow up in a month with us. Please [facility] to get an appointment [facility].   If you have any problems in the interim, please do not hesitate to call the clinic.

## 2016-05-04 NOTE — Progress Notes (Signed)
   CC: Hospital follow up for PNA  HPI:  [name] is a 56 y.o. female with a past medical history listed below here today for follow up after recent hospitalization for acute respiratory failure with hypoxia due to suspected viral respiratory infection with superimposed bacterial pneumonia  For details of today's visit and the status of her chronic medical issues please refer to the assessment and plan.   Past Medical History:  Diagnosis Date  . Asthma   . Former smoker   . H/O chronic pancreatitis   . H/O ETOH abuse    Past Surgical History:  Procedure Laterality Date  . CHOLECYSTECTOMY    . IVC FILTER PLACEMENT ([facility] HX)  12/2010  . PANCREAS SURGERY     Family History  Problem Relation Age of Onset  . Adopted: Yes   Social History   Social History  . Marital status: Single    Spouse name: N/A  . Number of children: N/A  . Years of education: N/A   Social History Main Topics  . Smoking status: Former Smoker    Packs/day: 1.00    Years: 15.00    Quit date: 04/11/2015  . Smokeless tobacco: Never Used  . Alcohol use Yes     Comment: infrequent 1 glass every several months  . Drug use: No  . Sexual activity: Not Asked   Other Topics Concern  . None   Social History Narrative  . None   Review of Systems:   See HPI  Physical Exam:  Vitals:   05/04/16 0830  BP: 104/65  Pulse: 75  Temp: 98.1 F (36.7 C)  TempSrc: Oral  SpO2: 97%  Weight: 181 lb 8 oz (82.3 kg)  Height: 5\' 6"  (1.676 m)   Physical Exam  Constitutional: She is well-developed, well-nourished, and in no distress. No distress.  Cardiovascular: Normal rate, regular rhythm and normal heart sounds.   Pulmonary/Chest: Effort normal. No respiratory distress. She has no wheezes.  Inspiratory crackles on left mid/upper back.   Abdominal: Soft. Bowel sounds are normal.  Skin: Skin is warm and dry.  Psychiatric: Mood and affect normal.   Assessment & Plan:   See Encounters Tab for  problem based charting.  Patient discussed with Dr. Heide SparkNarendra

## 2016-05-04 NOTE — Assessment & Plan Note (Addendum)
[name] was admitted from 1/12-1/18 for acute respiratory failure with hypoxia due to suspected viral respiratory infection with superimposed bacterial pneumonia. Upon admission she was hypoxic and required BiPAP. She was able to be weaned over the course of the hospitalization to 3L [place], however remained hypoxic with ambulation and discharged with 3L [place]. Her initial CXR on admission demonstrated a diffuse left lobe opacity concerning for pneumonia, personally reviewed and agree with findings. She received a 5 day course of azithromycin, ceftriaxone and tamiflu. Repeat CXR on 1/14 was concerning for bilateral opacities but patient was clinically improving at the time.   Today, she reports she is slowly improving but is not close to being back to baseline. Has been using 3L [place] continuously at home. Denies any shortness of breath but fatigues easily with minimal exertion. Mild non-productive cough. No fevers or chills.   O2 sats 97% at rest on RA. Remained >94% on RA with ambulation.   Reports plans to [facility] but has not initiated scheduling an appointment.   Assessment: Treated CAP with resolved hypoxia  Plan: No further need of home O2 Will need follow up in 1 month for repeat CXR

## 2016-05-05 NOTE — Progress Notes (Signed)
Internal Medicine Clinic Attending  Case discussed with Dr. Boswell at the time of the visit.  We reviewed the resident's history and exam and pertinent patient test results.  I agree with the assessment, diagnosis, and plan of care documented in the resident's note.  

## 2016-05-11 ENCOUNTER — Telehealth: Payer: Self-pay | Admitting: Internal Medicine

## 2016-05-11 NOTE — Telephone Encounter (Signed)
[facility] informed pt Fax [phone]

## 2016-05-11 NOTE — Telephone Encounter (Signed)
Pt says she called [facility] and they will not come pick up the oxygen with out the dr sending an order saying that she has been released from it

## 2016-05-12 ENCOUNTER — Telehealth: Payer: Self-pay

## 2016-05-12 NOTE — Telephone Encounter (Signed)
Pt request a letter stating that she needs another week of recovery to perform her job fully, she states her "boss" will not let her return until she is 100%, so could you write her a letter for 1 more week, returning to work 2/12? Fax- 2copies- [phone] attn: Kendahl Doe, call her and let her know it has been faxed

## 2016-05-12 NOTE — Telephone Encounter (Signed)
Pt needs to speak with a nurse regarding a letter for work. Please call back.

## 2016-05-15 ENCOUNTER — Ambulatory Visit (INDEPENDENT_AMBULATORY_CARE_PROVIDER_SITE_OTHER): Payer: Managed Care, Other (non HMO) | Admitting: Internal Medicine

## 2016-05-15 ENCOUNTER — Encounter: Payer: Self-pay | Admitting: Internal Medicine

## 2016-05-15 VITALS — BP 122/76 | HR 110 | Temp 97.9°F | Ht 66.0 in | Wt 176.1 lb

## 2016-05-15 DIAGNOSIS — R1012 Left upper quadrant pain: Secondary | ICD-10-CM | POA: Diagnosis not present

## 2016-05-15 DIAGNOSIS — R1032 Left lower quadrant pain: Secondary | ICD-10-CM

## 2016-05-15 DIAGNOSIS — Z8719 Personal history of other diseases of the digestive system: Secondary | ICD-10-CM

## 2016-05-15 DIAGNOSIS — R112 Nausea with vomiting, unspecified: Secondary | ICD-10-CM | POA: Diagnosis not present

## 2016-05-15 DIAGNOSIS — R197 Diarrhea, unspecified: Secondary | ICD-10-CM | POA: Diagnosis not present

## 2016-05-15 DIAGNOSIS — M549 Dorsalgia, unspecified: Secondary | ICD-10-CM

## 2016-05-15 DIAGNOSIS — Z9049 Acquired absence of other specified parts of digestive tract: Secondary | ICD-10-CM

## 2016-05-15 DIAGNOSIS — R109 Unspecified abdominal pain: Secondary | ICD-10-CM | POA: Insufficient documentation

## 2016-05-15 LAB — POCT URINALYSIS DIPSTICK
GLUCOSE UA: NEGATIVE
Nitrite, UA: NEGATIVE
PH UA: 5.5
Spec Grav, UA: 1.02
UROBILINOGEN UA: 1

## 2016-05-15 MED ORDER — PANTOPRAZOLE SODIUM 40 MG PO TBEC
40.0000 mg | DELAYED_RELEASE_TABLET | Freq: Every day | ORAL | 11 refills | Status: DC
Start: 2016-05-15 — End: 2016-07-21

## 2016-05-15 MED ORDER — TRAMADOL HCL 50 MG PO TABS: 50.0000 mg | ORAL_TABLET | Freq: Three times a day (TID) | ORAL | 0 refills | Status: DC | PRN

## 2016-05-15 MED ORDER — ONDANSETRON HCL 4 MG PO TABS: 4.0000 mg | ORAL_TABLET | Freq: Three times a day (TID) | ORAL | 0 refills | Status: DC | PRN

## 2016-05-15 NOTE — Progress Notes (Signed)
    CC: abdominal pain  HPI: [name] is a 56 y.o. female with PMHx of H/o pancreatitis, h/o of cholecystectomy, asthma who presents to the clinic for abdomdinal pain.   Patient was recently hospitalized in January for acute hypoxic respiratory failure 2/2 viral illness with superimposed CAP. She was treated with a 5 day course of Ceftriaxone and Azithromycin. She was discharged on O2, but later weaned off. She was improving as outpatient and at her clinic follow up. Last week, patient went back to [place]5 hours away to move her furniture [place]. On 2/1 and 2/2 patient started feeling unwell with LUQ pain radiating to her back, nausea, vomiting, and loose stools (however, she has had loose stools 3-4 per day since discharge). She does admit to drinking an alcoholic beverage while in [place] despite her known h/o pancreatitis. Her pain is pleuritic and similar to pneumonia pain. Her pain, nausea and vomiting are unrelated to food intake. She has been able to keep some food and liquid down. She admits to subjective fever and chills. She denies cough, nasal congestion, dysuria, urinary frequency, pain radiating to her groin.   Past Medical History:  Diagnosis Date  . Asthma   . Former smoker   . H/O chronic pancreatitis   . H/O ETOH abuse     Review of Systems: Please see pertinent ROS reviewed in HPI and problem based charting.   Physical Exam: Vitals:   05/15/16 1452  BP: 122/76  Pulse: (!) 110  Temp: 97.9 F (36.6 C)  TempSrc: Oral  SpO2: 96%  Weight: 176 lb 1.6 oz (79.9 kg)  Height: 5\' 6"  (1.676 m)   General: Vital signs reviewed.  Patient is well-developed and well-nourished, in no acute distress and cooperative with exam.  Cardiovascular: Tachycardic, regular rhythm, S1 normal, S2 normal, no murmurs, gallops, or rubs. Pulmonary/Chest: Clear to auscultation bilaterally, no wheezes, rales, or rhonchi. Abdominal: Soft, tender to palpation in LUQ and  LLQ, non-distended, BS +, no masses, organomegaly, or guarding present. No CVA tenderness Extremities: No lower extremity edema bilaterally, no calf tenderness Skin: Warm, dry and intact. No rashes or erythema. Psychiatric: Normal mood and affect. speech and behavior is normal. Cognition and memory are normal.   Assessment & Plan:  See encounters tab for problem based medical decision making. Patient discussed with Dr. Mikey BussingHoffman.

## 2016-05-15 NOTE — Assessment & Plan Note (Addendum)
Patient was recently hospitalized in January for acute hypoxic respiratory failure 2/2 viral illness with superimposed CAP. She was treated with a 5 day course of Ceftriaxone and Azithromycin. She was discharged on O2, but later weaned off. She was improving as outpatient and at her clinic follow up. Last week, patient went back to [place] about [place]5 hours away to move her furniture to [place]. On 2/1 and 2/2 patient started feeling unwell with LUQ pain radiating to her back, nausea, vomiting, and loose stools (however, she has had loose stools 3-4 per day since discharge). She does admit to drinking an alcoholic beverage while in [place] despite her known h/o pancreatitis. Her pain is pleuritic and similar to pneumonia pain. Her pain, nausea and vomiting are unrelated to food intake. She has been able to keep some food and liquid down. She admits to subjective fever and chills. She denies cough, nasal congestion, dysuria, urinary frequency, pain radiating to her groin.   DDx includes pancreatitis, diverticulitis, recurrent CAP or viral gastroenteritis. Patient is overall well appearing. I think it is okay to start with basic laboratory work and symptom control initially. If patient worsens, she should be seen in the ED. If laboratory work is concerning, we will proceed with CT abd/pelvis.   Plan: -CBC w diff -BMET -Lipase -Zofran for nausea -Tramadol 50 mg Q8H #9 for pain  Called patient to discuss results with patient. We went over her elevated lipase concern for mild acute pancreatitis. She also had an anion gap. This is likely due to a lactic acidosis. Otitis. Patient reports that her symptoms are much improved, she denies abdominal pain, she is eating and drinking well. She is using the Zofran for nausea. We discussed that since her pancreatitis has been complicated in the past, she should keep a close eye on her symptoms. If she were to have worsening abdominal pain, nausea, vomiting, or  inability to eat and drink, she should proceed to the emergency part. Patient was in agreement with plan.

## 2016-05-15 NOTE — Patient Instructions (Signed)
For your nausea, vomiting, diarrhea and pain, we will check lab work today and contact you. We will give you Zofran for nausea and tramadol for pain. Please get rest and stay hydrated. We will check in with you, but please let us know if you start feeling worse or develop new symptoms.

## 2016-05-16 LAB — CBC WITH DIFFERENTIAL/PLATELET
BASOS ABS: 0 10*3/uL (ref 0.0–0.2)
Basos: 0 %
EOS (ABSOLUTE): 0.4 10*3/uL (ref 0.0–0.4)
Eos: 3 %
HEMOGLOBIN: 12.9 g/dL (ref 11.1–15.9)
Hematocrit: 38.9 % (ref 34.0–46.6)
IMMATURE GRANS (ABS): 0.1 10*3/uL (ref 0.0–0.1)
Immature Granulocytes: 1 %
LYMPHS ABS: 1.7 10*3/uL (ref 0.7–3.1)
LYMPHS: 14 %
MCH: 35 pg — ABNORMAL HIGH (ref 26.6–33.0)
MCHC: 33.2 g/dL (ref 31.5–35.7)
MCV: 105 fL — AB (ref 79–97)
Monocytes Absolute: 1.4 10*3/uL — ABNORMAL HIGH (ref 0.1–0.9)
Monocytes: 11 %
NEUTROS ABS: 8.9 10*3/uL — AB (ref 1.4–7.0)
Neutrophils: 71 %
PLATELETS: 401 10*3/uL — AB (ref 150–379)
RBC: 3.69 x10E6/uL — ABNORMAL LOW (ref 3.77–5.28)
RDW: 16.1 % — ABNORMAL HIGH (ref 12.3–15.4)
WBC: 12.4 10*3/uL — ABNORMAL HIGH (ref 3.4–10.8)

## 2016-05-16 LAB — BMP8+ANION GAP
Anion Gap: 20 mmol/L — ABNORMAL HIGH (ref 10.0–18.0)
BUN / CREAT RATIO: 10 (ref 9–23)
BUN: 10 mg/dL (ref 6–24)
CHLORIDE: 100 mmol/L (ref 96–106)
CO2: 16 mmol/L — AB (ref 18–29)
Calcium: 9 mg/dL (ref 8.7–10.2)
Creatinine, Ser: 1 mg/dL (ref 0.57–1.00)
GFR calc non Af Amer: 64 mL/min/{1.73_m2} (ref >59)
GFR, EST AFRICAN AMERICAN: 73 mL/min/{1.73_m2} (ref >59)
GLUCOSE: 99 mg/dL (ref 65–99)
POTASSIUM: 4.4 mmol/L (ref 3.5–5.2)
SODIUM: 136 mmol/L (ref 134–144)

## 2016-05-16 LAB — LIPASE: LIPASE: 374 U/L — AB (ref 14–72)

## 2016-05-16 NOTE — Telephone Encounter (Signed)
Letter in the communications tab. Please fax.

## 2016-05-16 NOTE — Progress Notes (Signed)
Internal Medicine Clinic Attending  Case discussed with Dr. Burns at the time of the visit.  We reviewed the resident's history and exam and pertinent patient test results.  I agree with the assessment, diagnosis, and plan of care documented in the resident's note.  

## 2016-05-16 NOTE — Telephone Encounter (Signed)
Called pt and lm for rtc

## 2016-05-17 ENCOUNTER — Telehealth: Payer: Self-pay | Admitting: Internal Medicine

## 2016-05-17 NOTE — Telephone Encounter (Signed)
Pt states she had a message yesterday to return call to triage nurse

## 2016-05-17 NOTE — Telephone Encounter (Signed)
Rtc, no answer, lm

## 2016-05-22 NOTE — Telephone Encounter (Signed)
Lm for rtc 

## 2016-05-24 NOTE — Telephone Encounter (Signed)
Have called again, closing

## 2016-05-27 ENCOUNTER — Observation Stay (HOSPITAL_COMMUNITY)
Admission: EM | Admit: 2016-05-27 | Discharge: 2016-05-28 | Disposition: A | Discharge status: Home / Self Care | Payer: Managed Care, Other (non HMO) | Attending: Internal Medicine | Admitting: Internal Medicine

## 2016-05-27 ENCOUNTER — Encounter (HOSPITAL_COMMUNITY): Payer: Self-pay | Admitting: Emergency Medicine

## 2016-05-27 ENCOUNTER — Emergency Department (HOSPITAL_COMMUNITY): Payer: Managed Care, Other (non HMO)

## 2016-05-27 DIAGNOSIS — Z79899 Other long term (current) drug therapy: Secondary | ICD-10-CM | POA: Diagnosis not present

## 2016-05-27 DIAGNOSIS — J45909 Unspecified asthma, uncomplicated: Secondary | ICD-10-CM | POA: Diagnosis not present

## 2016-05-27 DIAGNOSIS — K7460 Unspecified cirrhosis of liver: Secondary | ICD-10-CM

## 2016-05-27 DIAGNOSIS — F17210 Nicotine dependence, cigarettes, uncomplicated: Secondary | ICD-10-CM

## 2016-05-27 DIAGNOSIS — N83202 Unspecified ovarian cyst, left side: Secondary | ICD-10-CM | POA: Diagnosis not present

## 2016-05-27 DIAGNOSIS — F1020 Alcohol dependence, uncomplicated: Secondary | ICD-10-CM | POA: Diagnosis not present

## 2016-05-27 DIAGNOSIS — K861 Other chronic pancreatitis: Secondary | ICD-10-CM

## 2016-05-27 DIAGNOSIS — Z8679 Personal history of other diseases of the circulatory system: Secondary | ICD-10-CM | POA: Diagnosis not present

## 2016-05-27 DIAGNOSIS — Z87891 Personal history of nicotine dependence: Secondary | ICD-10-CM | POA: Insufficient documentation

## 2016-05-27 DIAGNOSIS — F1010 Alcohol abuse, uncomplicated: Secondary | ICD-10-CM | POA: Insufficient documentation

## 2016-05-27 DIAGNOSIS — K859 Acute pancreatitis without necrosis or infection, unspecified: Secondary | ICD-10-CM | POA: Diagnosis not present

## 2016-05-27 DIAGNOSIS — Z66 Do not resuscitate: Secondary | ICD-10-CM

## 2016-05-27 DIAGNOSIS — I8291 Chronic embolism and thrombosis of unspecified vein: Secondary | ICD-10-CM | POA: Diagnosis not present

## 2016-05-27 DIAGNOSIS — R160 Hepatomegaly, not elsewhere classified: Secondary | ICD-10-CM

## 2016-05-27 DIAGNOSIS — K439 Ventral hernia without obstruction or gangrene: Secondary | ICD-10-CM | POA: Diagnosis not present

## 2016-05-27 DIAGNOSIS — E876 Hypokalemia: Secondary | ICD-10-CM | POA: Insufficient documentation

## 2016-05-27 DIAGNOSIS — Z9049 Acquired absence of other specified parts of digestive tract: Secondary | ICD-10-CM | POA: Diagnosis not present

## 2016-05-27 DIAGNOSIS — Z885 Allergy status to narcotic agent status: Secondary | ICD-10-CM

## 2016-05-27 DIAGNOSIS — K852 Alcohol induced acute pancreatitis without necrosis or infection: Secondary | ICD-10-CM

## 2016-05-27 LAB — URINALYSIS, ROUTINE W REFLEX MICROSCOPIC
Bacteria, UA: NONE SEEN
Bilirubin Urine: NEGATIVE
Glucose, UA: NEGATIVE mg/dL
Ketones, ur: NEGATIVE mg/dL
NITRITE: NEGATIVE
PH: 6 (ref 5.0–8.0)
Protein, ur: NEGATIVE mg/dL
WBC UA: NONE SEEN WBC/hpf (ref 0–5)

## 2016-05-27 LAB — COMPREHENSIVE METABOLIC PANEL
ALBUMIN: 2.8 g/dL — AB (ref 3.5–5.0)
ALK PHOS: 179 U/L — AB (ref 38–126)
ALT: 22 U/L (ref 14–54)
ANION GAP: 13 (ref 5–15)
AST: 34 U/L (ref 15–41)
BILIRUBIN TOTAL: 1.4 mg/dL — AB (ref 0.3–1.2)
BUN: 9 mg/dL (ref 6–20)
CALCIUM: 9.2 mg/dL (ref 8.9–10.3)
CO2: 17 mmol/L — ABNORMAL LOW (ref 22–32)
CREATININE: 1.14 mg/dL — AB (ref 0.44–1.00)
Chloride: 105 mmol/L (ref 101–111)
GFR calc Af Amer: >60 mL/min (ref >60)
GFR calc non Af Amer: 53 mL/min — ABNORMAL LOW (ref >60)
GLUCOSE: 114 mg/dL — AB (ref 65–99)
Potassium: 3.9 mmol/L (ref 3.5–5.1)
Sodium: 135 mmol/L (ref 135–145)
TOTAL PROTEIN: 7 g/dL (ref 6.5–8.1)

## 2016-05-27 LAB — LIPID PANEL
CHOL/HDL RATIO: 3.5 ratio
CHOLESTEROL: 130 mg/dL (ref 0–200)
HDL: 37 mg/dL — AB (ref >40)
LDL CALC: 74 mg/dL (ref 0–99)
TRIGLYCERIDES: 96 mg/dL (ref <150)
VLDL: 19 mg/dL (ref 0–40)

## 2016-05-27 LAB — CBC WITH DIFFERENTIAL/PLATELET
BASOS PCT: 0 %
Basophils Absolute: 0 10*3/uL (ref 0.0–0.1)
Eosinophils Absolute: 0.6 10*3/uL (ref 0.0–0.7)
Eosinophils Relative: 5 %
HEMATOCRIT: 37.2 % (ref 36.0–46.0)
HEMOGLOBIN: 12.5 g/dL (ref 12.0–15.0)
LYMPHS ABS: 1 10*3/uL (ref 0.7–4.0)
Lymphocytes Relative: 8 %
MCH: 34.5 pg — ABNORMAL HIGH (ref 26.0–34.0)
MCHC: 33.6 g/dL (ref 30.0–36.0)
MCV: 102.8 fL — ABNORMAL HIGH (ref 78.0–100.0)
MONOS PCT: 9 %
Monocytes Absolute: 1.2 10*3/uL — ABNORMAL HIGH (ref 0.1–1.0)
NEUTROS ABS: 10.2 10*3/uL — AB (ref 1.7–7.7)
NEUTROS PCT: 78 %
Platelets: 471 10*3/uL — ABNORMAL HIGH (ref 150–400)
RBC: 3.62 MIL/uL — AB (ref 3.87–5.11)
RDW: 15 % (ref 11.5–15.5)
WBC: 13 10*3/uL — ABNORMAL HIGH (ref 4.0–10.5)

## 2016-05-27 LAB — LIPASE, BLOOD: Lipase: 391 U/L — ABNORMAL HIGH (ref 11–51)

## 2016-05-27 MED ORDER — FENTANYL CITRATE (PF) 100 MCG/2ML IJ SOLN
50.0000 ug | Freq: Once | INTRAMUSCULAR | Status: AC
  Administered 2016-05-27: 50 ug via INTRAVENOUS
  Filled 2016-05-27: qty 2

## 2016-05-27 MED ORDER — ONDANSETRON HCL 4 MG/2ML IJ SOLN
4.0000 mg | Freq: Once | INTRAMUSCULAR | Status: AC
  Administered 2016-05-27: 4 mg via INTRAVENOUS
  Filled 2016-05-27: qty 2

## 2016-05-27 MED ORDER — LABETALOL HCL 200 MG PO TABS: 200.0000 mg | ORAL_TABLET | Freq: Two times a day (BID) | ORAL | Status: DC

## 2016-05-27 MED ORDER — HYDROMORPHONE HCL 2 MG/ML IJ SOLN
1.0000 mg | INTRAMUSCULAR | Status: DC | PRN
  Administered 2016-05-27 – 2016-05-28 (×5): 1 mg via INTRAVENOUS
  Filled 2016-05-27 (×5): qty 1

## 2016-05-27 MED ORDER — SODIUM CHLORIDE 0.9 % IV BOLUS (SEPSIS)
1000.0000 mL | Freq: Once | INTRAVENOUS | Status: AC
  Administered 2016-05-27: 1000 mL via INTRAVENOUS

## 2016-05-27 MED ORDER — SODIUM CHLORIDE 0.9 % IV SOLN
INTRAVENOUS | Status: AC
  Administered 2016-05-27 (×2): 1000 mL via INTRAVENOUS
  Administered 2016-05-28: 03:00:00 via INTRAVENOUS

## 2016-05-27 MED ORDER — PANTOPRAZOLE SODIUM 40 MG IV SOLR
40.0000 mg | Freq: Two times a day (BID) | INTRAVENOUS | Status: DC
  Administered 2016-05-27 – 2016-05-28 (×2): 40 mg via INTRAVENOUS
  Filled 2016-05-27 (×2): qty 40

## 2016-05-27 MED ORDER — ONDANSETRON HCL 4 MG PO TABS: 4.0000 mg | ORAL_TABLET | Freq: Four times a day (QID) | ORAL | Status: DC | PRN

## 2016-05-27 MED ORDER — ENOXAPARIN SODIUM 40 MG/0.4ML ~~LOC~~ SOLN: 40.0000 mg | SUBCUTANEOUS | Status: DC

## 2016-05-27 MED ORDER — ONDANSETRON HCL 4 MG/2ML IJ SOLN
4.0000 mg | Freq: Four times a day (QID) | INTRAMUSCULAR | Status: DC | PRN
  Administered 2016-05-27 (×2): 4 mg via INTRAVENOUS
  Filled 2016-05-27 (×2): qty 2

## 2016-05-27 MED ORDER — IOPAMIDOL (ISOVUE-300) INJECTION 61%
INTRAVENOUS | Status: AC
  Administered 2016-05-27: 100 mL
  Filled 2016-05-27: qty 100

## 2016-05-27 NOTE — Progress Notes (Signed)
Sharon MealingVictoria Kent [id number] Admitted to [id number]: 05/27/2016 12:02 PM Attending Provider: Doneen PoissonLawrence Klima, MD    Sharon MealingVictoria Kent is a 56 y.o. female patient admitted from ED awake, alert  & orientated  X 3,  Prior, VSS - Blood pressure 114/64, pulse 73, temperature 99.3 F (37.4 Kent), temperature source Oral, resp. rate 18, SpO2 94 %., R/A, no Kent/o shortness of breath, no Kent/o chest pain, no distress noted, Kent/o abd. Pain and nausea. Non Tele.   IV site WDL:  antecubital left, condition patent and no redness with a transparent dsg that's clean dry and intact.  Allergies:   Allergies  Allergen Reactions  . Morphine Nausea Only  . Oxycodone-Acetaminophen Other (See Comments)    Makes her "loopy."     Past Medical History:  Diagnosis Date  . Asthma   . Former smoker   . H/O chronic pancreatitis   . H/O ETOH abuse     History:  Will be obtained from the patient.  Pt orientation to unit, room and routine. Information packet given to patient/family and safety video watched.  Admission INP armband ID verified with patient/family, and in place. SR up x 2, fall risk assessment complete with Patient and family verbalizing understanding of risks associated with falls. Pt verbalizes an understanding of how to use the call bell and to call for help before getting out of bed.  Skin, clean-dry- intact without evidence of bruising, or skin tears.   No evidence of skin break down noted on exam.    Will cont to monitor and assist as needed.  Joana ReamerJohnson, Sharon Delavega C, RN 05/27/2016 12:02 PM

## 2016-05-27 NOTE — ED Provider Notes (Signed)
[facility]EMERGENCY DEPT Provider Note   CSN: [id number] Arrival date & time: 05/27/16  4098     History   Chief Complaint Chief Complaint  Patient presents with  . Abdominal Pain    HPI Sharon Kent is a 56 y.o. female.  HPI  Sharon Kent is 56 year old female with history of chronic pancreatitis, cholecystectomy and  who presents with abdominal pain. She reports abdominal pain for the last 7 days. Describes the pain as sharp like stabbing. Pain is intermittent with radiation to her back. She also reports emesis 2 this morning. Emesis was her lotion for me without blood. She also reports watery diarrhea, which was nonbloody. She went to PCP about 5 days ago and was told her lipase was high. She was given Zofran and tramadol. Tramadol helped with the pain but she ran out of tramadol about 2 days ago. She says her abdominal pain gotten worse since yesterday morning.  She denies fever but admits chills and diaphoresis. She denies cough,  shortness of breath or dysuria. Admits to drinking occasionally. Last drink was a glass of champagne about 5 days ago. Past Medical History:  Diagnosis Date  . Asthma   . Former smoker   . H/O chronic pancreatitis   . H/O ETOH abuse     Patient Active Problem List   Diagnosis Date Noted  . Acute left flank pain 05/15/2016  . Dyspnea   . Community acquired pneumonia of left lower lobe of lung (HCC)   . Hypokalemia   . History of pancreatitis 04/21/2016  . Asthma 04/21/2016  . 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 S/P cholecystectomy 04/21/2016  . Tobacco abuse 04/21/2016  . Alcohol abuse 04/21/2016    Past Surgical History:  Procedure Laterality Date  . CHOLECYSTECTOMY    . IVC FILTER PLACEMENT ([facility] HX)  12/2010  . PANCREAS SURGERY      OB History    No data available       Home Medications    Prior to Admission medications   Medication Sig Start Date End Date Taking? Authorizing Provider  acetaminophen (TYLENOL) 325 MG tablet  Take 650 mg by mouth every 6 (six) hours as needed for moderate pain.    Yes Historical Provider, MD  amLODipine (NORVASC) 10 MG tablet Take 10 mg by mouth daily. 02/14/16  Yes Historical Provider, MD  Dextromethorphan-Guaifenesin (ROBITUSSIN COUGH+CHEST CONG DM) 5-100 MG/5ML LIQD Take 20 mLs by mouth daily as needed (cough).   Yes Historical Provider, MD  ibuprofen (ADVIL,MOTRIN) 200 MG tablet Take 400 mg by mouth every 6 (six) hours as needed for mild pain.    Yes Historical Provider, MD  labetalol (NORMODYNE) 100 MG tablet Take 200 mg by mouth 2 (two) times daily.   Yes Historical Provider, MD  lipase/protease/amylase (CREON) 12000 units CPEP capsule Take 24,000 Units by mouth 3 (three) times daily with meals. 11/27/15  Yes Historical Provider, MD  Multiple Vitamins-Minerals (MULTIVITAMIN WITH MINERALS) tablet Take 1 tablet by mouth daily.   Yes Historical Provider, MD  ondansetron (ZOFRAN) 4 MG tablet Take 1 tablet (4 mg total) by mouth every 8 (eight) hours as needed for nausea or vomiting. 05/15/16  Yes Alexa Lucrezia Starch, MD  pantoprazole (PROTONIX) 40 MG tablet Take 1 tablet (40 mg total) by mouth daily. 05/15/16  Yes Alexa Lucrezia Starch, MD  potassium chloride SA (K-DUR,KLOR-CON) 20 MEQ tablet Take 1 tablet (20 mEq total) by mouth 2 (two) times daily. Patient taking differently: Take 20 mEq by mouth daily.  04/27/16  Yes Eulah Pont, MD  traMADol (ULTRAM) 50 MG tablet Take 1 tablet (50 mg total) by mouth every 8 (eight) hours as needed. Patient taking differently: Take 50 mg by mouth every 8 (eight) hours as needed for moderate pain.  05/15/16  Yes Alexa Lucrezia Starch, MD    Family History Family History  Problem Relation Age of Onset  . Adopted: Yes    Social History Social History  Substance Use Topics  . Smoking status: Former Smoker    Packs/day: 1.00    Years: 15.00    Quit date: 04/11/2015  . Smokeless tobacco: Never Used  . Alcohol use Yes     Comment: infrequent 1 glass every several months      Allergies   Morphine and Oxycodone-acetaminophen   Review of Systems Review of Systems  Constitutional: Positive for chills and diaphoresis. Negative for fever and unexpected weight change.  HENT: Negative for ear pain, rhinorrhea, sore throat and trouble swallowing.   Eyes: Negative for visual disturbance.  Respiratory: Negative for cough and shortness of breath.   Cardiovascular: Negative for chest pain and palpitations.  Gastrointestinal: Positive for abdominal pain, diarrhea, nausea and vomiting. Negative for blood in stool.  Genitourinary: Negative for dysuria and hematuria.  Musculoskeletal: Positive for back pain. Negative for myalgias.  Skin: Negative for color change and rash.  Neurological: Negative for seizures and syncope.  Hematological: Does not bruise/bleed easily.  Psychiatric/Behavioral: Negative for confusion.  All other systems reviewed and are negative.  Physical Exam Updated Vital Signs BP 116/83   Pulse 71   Temp 97.7 F (36.5 C) (Oral)   Resp 13   SpO2 94%   Physical Exam  Constitutional: She appears well-developed and well-nourished. No distress.  HENT:  Head: Normocephalic and atraumatic.  Eyes: Conjunctivae are normal.  Neck: Neck supple.  Cardiovascular: Normal rate and regular rhythm.   No murmur heard. Pulmonary/Chest: Effort normal and breath sounds normal. No respiratory distress. She has no wheezes.  Abdominal: Soft. Bowel sounds are normal. She exhibits no mass. There is tenderness. There is no rebound and no guarding.  Tenderness to palpation over right and left upper quadrants  Musculoskeletal: She exhibits tenderness. She exhibits no edema.  Neurological: She is alert.  Skin: Skin is warm and dry.  Psychiatric: She has a normal mood and affect. Her behavior is normal.  Nursing note and vitals reviewed.  ED Treatments / Results  Labs (all labs ordered are listed, but only abnormal results are displayed) Labs Reviewed  CBC  WITH DIFFERENTIAL/PLATELET - Abnormal; Notable for the following:       Result Value   WBC 13.0 (*)    RBC 3.62 (*)    MCV 102.8 (*)    MCH 34.5 (*)    Platelets 471 (*)    Neutro Abs 10.2 (*)    Monocytes Absolute 1.2 (*)    All other components within normal limits  COMPREHENSIVE METABOLIC PANEL - Abnormal; Notable for the following:    CO2 17 (*)    Glucose, Bld 114 (*)    Creatinine, Ser 1.14 (*)    Albumin 2.8 (*)    Alkaline Phosphatase 179 (*)    Total Bilirubin 1.4 (*)    GFR calc non Af Amer 53 (*)    All other components within normal limits  LIPASE, BLOOD - Abnormal; Notable for the following:    Lipase 391 (*)    All other components within normal limits  URINALYSIS, ROUTINE W  REFLEX MICROSCOPIC    EKG  EKG Interpretation None       Radiology No results found.  Procedures Procedures (including critical care time)  Medications Ordered in ED Medications  sodium chloride 0.9 % bolus 1,000 mL (not administered)  ondansetron (ZOFRAN) injection 4 mg (not administered)     Initial Impression / Assessment and Plan / ED Course  I have reviewed the triage vital signs and the nursing notes.  Pertinent labs & imaging results that were available during my care of the patient were reviewed by me and considered in my medical decision making (see chart for details).  Earnestine MealingVictoria Alas is a 56 year old female with history of chronic pancreatitis and cholecystectomy presents for 7 days of abdominal pain radiating to her back suggestive for acute on chronic pancreatitis. Patient has lipase elevated to 391 and alkaline phosphate to 179. Patient is otherwise hemodynamically stable.   CT abdomen with Acute on chronic calcific pancreatitis and significant peripancreatic edema with questionable developing pseudocysts lateral to the spleen about, 5.1 x 2.4 x 4.1 cm.  Admitted patient to IMTS for acute on chronic pancreatitis with possible developing pseudocyst.   Final  Clinical Impressions(s) / ED Diagnoses   Final diagnoses:  None    New Prescriptions New Prescriptions   No medications on file     Almon Herculesaye T Gonfa, MD 05/27/16 1036    Melene Planan Floyd, DO 05/27/16 1038    Melene Planan Floyd, DO 05/28/16 1524

## 2016-05-27 NOTE — ED Triage Notes (Signed)
Pt presents to the ED for abdominal pain. Vomiting bile and diarrhea. Started a week ago went to PCP and had elevated lipase. The symptoms have continued over the past week. Hx of pancreatitis

## 2016-05-27 NOTE — ED Notes (Signed)
Patient sitting up in the bed.  States she cannot void at this time

## 2016-05-27 NOTE — ED Notes (Signed)
Attempted to obtain urine specimen; Pt unable to provide one at this time 

## 2016-05-27 NOTE — Progress Notes (Signed)
[place] received from [place], [place].  Will await for pt. To arrived from ED. Forbes Cellarelcine Kavish Lafitte, RN

## 2016-05-27 NOTE — ED Notes (Signed)
Patient transported to CT 

## 2016-05-27 NOTE — H&P (Signed)
Date: 05/27/2016               Patient Name:  Sharon Kent MRN: [id number]  DOB: Aug 23, 1960 Age / Sex: 56 y.o., female   PCP: Pcp Not In System         Medical Service: Internal Medicine Teaching Service         Attending Physician: Dr. Doneen Poisson, MD    First Contact: Dr. Nelson Chimes Pager: [phone]  Second Contact: Dr. Allena Katz Pager: [phone]       After Hours (After 5p/  First Contact Pager: [phone]  weekends / holidays): Second Contact Pager: [phone]   Chief Complaint:.Epigastric pain with nausea and vomiting.  History of Present Illness: Miia Blanks is 56 year old female with history of chronic pancreatitis, cholecystectomy and  who presents with worsened abdominal pain, associated with nausea and vomiting since yesterday. Patient was complaining of having intermittent abdominal pain for 1 week, describes pain as stabbing, radiating to her back. She also complained of bowel movements 1-2 times every day, which are foul-smelling and float. She went to see her primary care approximately 5 days ago, was told that her lipase was high, she was given Zofran and tramadol. Tramadol helped her pain but she ran out of it  2 days ago. Since yesterday her pain got worse, associated with nausea and multiple vomitus, she do endorse some chills and diaphoresis, and decrease in appetite. She denies any upper respiratory symptoms, chest pain, generalized malaise, fever, dysuria or hematuria.  Meds:  Current Meds  Medication Sig  . acetaminophen (TYLENOL) 325 MG tablet Take 650 mg by mouth every 6 (six) hours as needed for moderate pain.   Marland Kitchen amLODipine (NORVASC) 10 MG tablet Take 10 mg by mouth daily.  Marland Kitchen Dextromethorphan-Guaifenesin (ROBITUSSIN COUGH+CHEST CONG DM) 5-100 MG/5ML LIQD Take 20 mLs by mouth daily as needed (cough).  Marland Kitchen ibuprofen (ADVIL,MOTRIN) 200 MG tablet Take 400 mg by mouth every 6 (six) hours as needed for mild pain.   Marland Kitchen labetalol (NORMODYNE) 100 MG tablet Take 200 mg by  mouth 2 (two) times daily.  . lipase/protease/amylase (CREON) 12000 units CPEP capsule Take 24,000 Units by mouth 3 (three) times daily with meals.  . Multiple Vitamins-Minerals (MULTIVITAMIN WITH MINERALS) tablet Take 1 tablet by mouth daily.  . ondansetron (ZOFRAN) 4 MG tablet Take 1 tablet (4 mg total) by mouth every 8 (eight) hours as needed for nausea or vomiting.  . pantoprazole (PROTONIX) 40 MG tablet Take 1 tablet (40 mg total) by mouth daily.  . potassium chloride SA (K-DUR,KLOR-CON) 20 MEQ tablet Take 1 tablet (20 mEq total) by mouth 2 (two) times daily. (Patient taking differently: Take 20 mEq by mouth daily. )  . traMADol (ULTRAM) 50 MG tablet Take 1 tablet (50 mg total) by mouth every 8 (eight) hours as needed. (Patient taking differently: Take 50 mg by mouth every 8 (eight) hours as needed for moderate pain. )     Allergies: Allergies as of 05/27/2016 - Review Complete 05/27/2016  Allergen Reaction Noted  . Morphine Nausea Only 11/23/2015  . Oxycodone-acetaminophen Other (See Comments) 12/26/2012   Past Medical History:  Diagnosis Date  . Asthma   . Former smoker   . H/O chronic pancreatitis   . H/O ETOH abuse     Family History: Patient was adopted.  Social History: Used to drink a lot of alcohol until 4 years ago, now occasionally drinks. Lifelong smoker, used to smoke 1 pack per day, not trying to cut  back, currently using 3-4 cigarettes per day for past 6 months.  Review of Systems: A complete ROS was negative except as per HPI.   Physical Exam: Blood pressure 123/76, pulse 78, temperature 98.5 F (36.9 C), resp. rate 17, SpO2 96 %. Vitals:   05/27/16 1039 05/27/16 1100 05/27/16 1143 05/27/16 1424  BP:  121/85 114/64 123/76  Pulse: 74 70 73 78  Resp: 16 14 18 17   Temp:   99.3 F (37.4 C) 98.5 F (36.9 C)  TempSrc:   Oral   SpO2: 94% 90% 94% 96%   General: Vital signs reviewed.  Patient is well-developed and well-nourished, in no acute distress and  cooperative with exam.  Head: Normocephalic and atraumatic. Eyes: EOMI, conjunctivae normal, no scleral icterus.  Neck: Supple, trachea midline, normal ROM, no JVD, masses, thyromegaly, or carotid bruit present.  Cardiovascular: RRR, S1 normal, S2 normal, no murmurs, gallops, or rubs. Pulmonary/Chest: Clear to auscultation bilaterally, no wheezes, rales, or rhonchi. Abdominal: Soft,Right upper quadrant and epigastric tenderness, non-distended, BS +, no masses, organomegaly, or guarding present.  Extremities: No lower extremity edema bilaterally,  pulses symmetric and intact bilaterally. No cyanosis or clubbing. Neurological: A&O x3, Strength is normal and symmetric bilaterally, cranial nerve II-XII are grossly intact, no focal motor deficit, sensory intact to light touch bilaterally.  Skin: Warm, dry and intact. No rashes or erythema. Psychiatric: Normal mood and affect. speech and behavior is normal. Cognition and memory are normal.  Labs. CBC    Component Value Date/Time   WBC 13.0 (H) 05/27/2016 0555   RBC 3.62 (L) 05/27/2016 0555   HGB 12.5 05/27/2016 0555   HCT 37.2 05/27/2016 0555   HCT 38.9 05/15/2016 1542   PLT 471 (H) 05/27/2016 0555   PLT 401 (H) 05/15/2016 1542   MCV 102.8 (H) 05/27/2016 0555   MCV 105 (H) 05/15/2016 1542   MCH 34.5 (H) 05/27/2016 0555   MCHC 33.6 05/27/2016 0555   RDW 15.0 05/27/2016 0555   RDW 16.1 (H) 05/15/2016 1542   LYMPHSABS 1.0 05/27/2016 0555   LYMPHSABS 1.7 05/15/2016 1542   MONOABS 1.2 (H) 05/27/2016 0555   EOSABS 0.6 05/27/2016 0555   EOSABS 0.4 05/15/2016 1542   BASOSABS 0.0 05/27/2016 0555   BASOSABS 0.0 05/15/2016 1542   CMP     Component Value Date/Time   NA 135 05/27/2016 0555   NA 136 05/15/2016 1542   K 3.9 05/27/2016 0555   CL 105 05/27/2016 0555   CO2 17 (L) 05/27/2016 0555   GLUCOSE 114 (H) 05/27/2016 0555   BUN 9 05/27/2016 0555   BUN 10 05/15/2016 1542   CREATININE 1.14 (H) 05/27/2016 0555   CALCIUM 9.2 05/27/2016  0555   PROT 7.0 05/27/2016 0555   ALBUMIN 2.8 (L) 05/27/2016 0555   AST 34 05/27/2016 0555   ALT 22 05/27/2016 0555   ALKPHOS 179 (H) 05/27/2016 0555   BILITOT 1.4 (H) 05/27/2016 0555   GFRNONAA 53 (L) 05/27/2016 0555   GFRAA >60 05/27/2016 0555   Urinalysis    Component Value Date/Time   COLORURINE YELLOW 05/27/2016 1425   APPEARANCEUR HAZY (A) 05/27/2016 1425   LABSPEC >1.046 (H) 05/27/2016 1425   PHURINE 6.0 05/27/2016 1425   GLUCOSEU NEGATIVE 05/27/2016 1425   HGBUR SMALL (A) 05/27/2016 1425   BILIRUBINUR NEGATIVE 05/27/2016 1425   BILIRUBINUR mod 05/15/2016 1518   KETONESUR NEGATIVE 05/27/2016 1425   PROTEINUR NEGATIVE 05/27/2016 1425   UROBILINOGEN 1.0 05/15/2016 1518   NITRITE NEGATIVE 05/27/2016 1425  LEUKOCYTESUR MODERATE (A) 05/27/2016 1425   Lipid Panel     Component Value Date/Time   CHOL 130 05/27/2016 0555   TRIG 96 05/27/2016 0555   HDL 37 (L) 05/27/2016 0555   CHOLHDL 3.5 05/27/2016 0555   VLDL 19 05/27/2016 0555   LDLCALC 74 05/27/2016 0555   Lipase. 391   CT abdomen and pelvis. FINDINGS: Lower chest: Bibasilar atelectasis greater on RIGHT  Hepatobiliary: Probable cirrhotic liver. Gallbladder surgically absent. Questionable area of low attenuation within the RIGHT lobe, poorly defined, approximately 3.4 cm in diameter, mass not excluded.  Pancreas: Few scattered calcifications consistent with chronic calcific pancreatitis. Superimposed acute pancreatitis with significant peripancreatic edema extending to liver, duodenum, stomach and spleen. No pancreatic hemorrhage or necrosis. 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 Main portal vein reconstitutes at the porta hepatis.  Spleen: Normal appearance  Adrenals/Urinary Tract: Calcification centrally in RIGHT adrenal gland. Adrenal glands otherwise normal appearance. Kidneys, ureters and bladder normal  appearance.  Stomach/Bowel: Segment of transverse colon extends into a ventral hernia in the epigastrium without evidence of obstruction. Normal appendix. Stomach decompressed with enhancing collaterals vs. varices noted at the posterior gastric wall. No definite paraesophageal varices seen. Small bowel loops unremarkable.  Vascular/Lymphatic: Aorta normal caliber. IVC filter noted. 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  Reproductive: Unremarkable uterus and RIGHT adnexa. Small LEFT ovarian cyst.  Other: Small amount of perihepatic and perisplenic free fluid. Fluid adjacent to the lateral margin of the spleen is questionably loculated, cannot exclude developing pseudocyst 5.1 x 2.4 x 4.1 cm.  Musculoskeletal: No acute osseous findings.  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  Significant peripancreatic edema with questionable developing pseudocysts lateral to the spleen.  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  Supraumbilical ventral hernia containing a nonobstructed segment of transverse colon.  Bibasilar atelectasis greater on RIGHT.  Suspected cirrhotic liver with an area of low attenuation within the RIGHT lobe at least 3.4 cm in size, cannot exclude mass lesion; followup non emergent MR imaging of the abdomen with and without contrast recommended to exclude hepatic neoplasm.   Assessment & Plan by Problem:   Neala Miggins is 56 year old female with history of chronic pancreatitis, cholecystectomy and  who presents with worsened abdominal pain, associated with nausea and vomiting since yesterday.  Acute on chronic pancreatitis. Her symptoms and CT abdomen is consistent with acute on chronic pancreatitis, with possible development of pseudocyst.  She was given a bolus of 1 L in ED. -IV NS 125 ml/hr. -Dilaudid when necessary for pain -Zofran when necessary for nausea and vomiting. -Protonix 40 mg IV twice daily  Hepatic Mass.Rt. Lobe mass found on Ct abdomen,Need further evaluation to rule out any possible malignancy.  History of hypertension. Found on her previous notes on 2016. She was on labetalol 200 mg twice daily according to home med list. She remained normotensive without taking medicine. -Hold labetalol and monitor.  DVT prophylaxis. Lovenox CODE STATUS. DNR Diet. NPO.  Dispo: Admit patient to Observation with expected length of stay less than 2 midnights.  Signed: Arnetha Courser, MD 05/27/2016, 6:35 PM  Pager: [phone]

## 2016-05-28 DIAGNOSIS — K7460 Unspecified cirrhosis of liver: Secondary | ICD-10-CM | POA: Diagnosis not present

## 2016-05-28 DIAGNOSIS — R160 Hepatomegaly, not elsewhere classified: Secondary | ICD-10-CM | POA: Diagnosis not present

## 2016-05-28 DIAGNOSIS — K861 Other chronic pancreatitis: Secondary | ICD-10-CM | POA: Diagnosis not present

## 2016-05-28 DIAGNOSIS — K859 Acute pancreatitis without necrosis or infection, unspecified: Secondary | ICD-10-CM | POA: Diagnosis not present

## 2016-05-28 LAB — COMPREHENSIVE METABOLIC PANEL
ALBUMIN: 2 g/dL — AB (ref 3.5–5.0)
ALK PHOS: 136 U/L — AB (ref 38–126)
ALT: 15 U/L (ref 14–54)
ANION GAP: 7 (ref 5–15)
AST: 22 U/L (ref 15–41)
BUN: 6 mg/dL (ref 6–20)
CHLORIDE: 112 mmol/L — AB (ref 101–111)
CO2: 18 mmol/L — AB (ref 22–32)
Calcium: 8 mg/dL — ABNORMAL LOW (ref 8.9–10.3)
Creatinine, Ser: 0.91 mg/dL (ref 0.44–1.00)
GFR calc non Af Amer: >60 mL/min (ref >60)
Glucose, Bld: 80 mg/dL (ref 65–99)
Potassium: 3.7 mmol/L (ref 3.5–5.1)
SODIUM: 137 mmol/L (ref 135–145)
Total Bilirubin: 1 mg/dL (ref 0.3–1.2)
Total Protein: 5.4 g/dL — ABNORMAL LOW (ref 6.5–8.1)

## 2016-05-28 LAB — HIV ANTIBODY (ROUTINE TESTING W REFLEX): HIV SCREEN 4TH GENERATION: NONREACTIVE

## 2016-05-28 MED ORDER — TRAMADOL HCL 50 MG PO TABS: 50.0000 mg | ORAL_TABLET | Freq: Three times a day (TID) | ORAL | 0 refills | Status: DC | PRN

## 2016-05-28 MED ORDER — ONDANSETRON HCL 4 MG PO TABS: 4.0000 mg | ORAL_TABLET | Freq: Four times a day (QID) | ORAL | 0 refills | Status: DC | PRN

## 2016-05-28 MED ORDER — PANCRELIPASE (LIP-PROT-AMYL) 12000-38000 UNITS PO CPEP
24000.0000 [IU] | ORAL_CAPSULE | Freq: Three times a day (TID) | ORAL | Status: DC
  Administered 2016-05-28: 24000 [IU] via ORAL
  Filled 2016-05-28: qty 2

## 2016-05-28 NOTE — Progress Notes (Signed)
   Subjective: Patient was feeling better this morning. Stating that she is hungry and wants to eat something. Later she tolerated advancement in diet very well.  Objective:  Vital signs in last 24 hours: Vitals:   05/27/16 1143 05/27/16 1424 05/27/16 2132 05/28/16 0619  BP: 114/64 123/76 112/64 120/73  Pulse: 73 78 77 82  Resp: 18 17 18 18   Temp: 99.3 F (37.4 C) 98.5 F (36.9 C) 98.8 F (37.1 C) 98.3 F (36.8 C)  TempSrc: Oral     SpO2: 94% 96% 93% 92%   Gen. Well-developed, well-nourished lady, in no acute distress. Chest. Clear bilaterally. CVS. Regular rate and rhythm. Abdomen. Soft, nontender, nondistended, ventral abdominal wall hernia, bowel sounds positive. Extremities. No edema, no cyanosis, pulses 2+ bilaterally.  Labs. CMP Latest Ref Rng & Units 05/28/2016 05/27/2016 05/15/2016  Glucose 65 - 99 mg/dL 80 161(W114(H) 99  BUN 6 - 20 mg/dL 6 9 10   Creatinine 0.44 - 1.00 mg/dL 9.600.91 4.54(U1.14(H) 9.811.00  Sodium 135 - 145 mmol/L 137 135 136  Potassium 3.5 - 5.1 mmol/L 3.7 3.9 4.4  Chloride 101 - 111 mmol/L 112(H) 105 100  CO2 22 - 32 mmol/L 18(L) 17(L) 16(L)  Calcium 8.9 - 10.3 mg/dL 8.0(L) 9.2 9.0  Total Protein 6.5 - 8.1 g/dL 1.9(J5.4(L) 7.0 -  Total Bilirubin 0.3 - 1.2 mg/dL 1.0 4.7(W1.4(H) -  Alkaline Phos 38 - 126 U/L 136(H) 179(H) -  AST 15 - 41 U/L 22 34 -  ALT 14 - 54 U/L 15 22 -   Lipid Panel     Component Value Date/Time   CHOL 130 05/27/2016 0555   TRIG 96 05/27/2016 0555   HDL 37 (L) 05/27/2016 0555   CHOLHDL 3.5 05/27/2016 0555   VLDL 19 05/27/2016 0555   LDLCALC 74 05/27/2016 0555    Assessment/Plan:  TurkeyVictoria Hatcheris 56 year old female with history of chronic pancreatitis, cholecystectomy and who presents with worsened abdominal pain, associated with nausea and vomiting since yesterday.  Acute on chronic pancreatitis. Her symptoms improved. -Can be discharged home on Zofran and tramadol if needed. -Continue Protonix according to her home regimen.  Hepatic  Mass.Rt. Lobe mass found on Ct abdomen. -AFP tumor marker testing. We will call her with results. -Might need further evaluation with MRI as an outpatient.  History of hypertension. Found on her previous notes on 2016. Currently normotensive, continue holding labetalol. Can be reassessed as an outpatient. If she needs a beta blocker, propranolol might be a better option because of her varices.  Dispo: Being discharged today.  Arnetha CourserSumayya Jahron Hunsinger, MD 05/28/2016, 11:37 AM Pager: [phone]

## 2016-05-28 NOTE — Discharge Instructions (Signed)
It was pleasure taking care of you. Please follow up in clinic according to your existing appointment on next Monday. I am holding your blood pressure medicine, we can reevaluate you during your follow-up visit. Please keep yourself well hydrated, I am giving you a small amount of nausea and pain medicine you can use it if needed. As we talked this morning, we will call you with any abnormal results. If your blood results are normal, your doctor can discuss with you about future plan regarding further investigating a small lesion on your liver which we found during CT of your belly during this hospital admission.

## 2016-05-28 NOTE — Discharge Summary (Signed)
Name: Sharon Kent MRN: [id number] DOB: Oct 21, 1960 56 y.o. PCP: Pcp Not In System  Date of Admission: 05/27/2016  5:22 AM Date of Discharge: 05/28/2016 Attending Physician: Doneen Poisson, MD  Discharge Diagnosis: 1. Acute on chronic pancreatitis.  Discharge Medications: Allergies as of 05/28/2016      Reactions   Morphine Nausea Only   Oxycodone-acetaminophen Other (See Comments)   Makes her "loopy."      Medication List    STOP taking these medications   amLODipine 10 MG tablet Commonly known as:  NORVASC   labetalol 100 MG tablet Commonly known as:  NORMODYNE   ROBITUSSIN COUGH+CHEST CONG DM 5-100 MG/5ML Liqd Generic drug:  Dextromethorphan-Guaifenesin     TAKE these medications   acetaminophen 325 MG tablet Commonly known as:  TYLENOL Take 650 mg by mouth every 6 (six) hours as needed for moderate pain.   ibuprofen 200 MG tablet Commonly known as:  ADVIL,MOTRIN Take 400 mg by mouth every 6 (six) hours as needed for mild pain.   lipase/protease/amylase 40981 units Cpep capsule Commonly known as:  CREON Take 24,000 Units by mouth 3 (three) times daily with meals.   multivitamin with minerals tablet Take 1 tablet by mouth daily.   ondansetron 4 MG tablet Commonly known as:  ZOFRAN Take 1 tablet (4 mg total) by mouth every 6 (six) hours as needed for nausea. What changed:  when to take this  reasons to take this   pantoprazole 40 MG tablet Commonly known as:  PROTONIX Take 1 tablet (40 mg total) by mouth daily.   potassium chloride SA 20 MEQ tablet Commonly known as:  K-DUR,KLOR-CON Take 1 tablet (20 mEq total) by mouth 2 (two) times daily. What changed:  when to take this   traMADol 50 MG tablet Commonly known as:  ULTRAM Take 1 tablet (50 mg total) by mouth every 8 (eight) hours as needed. What changed:  reasons to take this       Disposition and follow-up:   [name] was discharged from [facility] in Good  condition.  At the hospital follow up visit please address:  1.  Resolution of her current symptoms. -She was found to have a suspicious mass and right lobe of her liver on CT abdomen, AFP tumor marker was sent, result pending. She might need further investigation with MRI or specialized CT with contrast to see liver lesions. -Please assess her need of antihypertensive, we stopped her amlodipine and labetalol as she was normotensive without them. Antihypertensive can be restarted if needed.  2.  Labs / imaging needed at time of follow-up: None  3.  Pending labs/ test needing follow-up: AFP tumor marker.  Follow-up Appointments: Follow-up Information    [facility]. Go on 06/05/2016.   Contact information: [address] [place] [address] [phone]          Hospital Course by problem list: Sharon Kent is a 56 year old woman with a history of alcohol abuse complicated by chronic pancreatitis and cirrhosis who presents with a one-week history of progressive abdominal pain described as stabbing and radiating to the back.  Acute on chronic pancreatitis. Her symptoms improved with IV fluid, Zofran and some Dilaudid. She was able to tolerate advance diet. She was discharged home with some Zofran and tramadol to be used if needed. She needs counseling regarding complete abstinence from alcohol to prevent future exacerbations.  Hepatic Mass.Rt. Lobe mass found on Ct abdomen. -AFP tumor marker  testing. We will call her with results. -Might need further evaluation with MRI as an outpatient.  History of hypertension.Found on her previous notes on 2016. Currently normotensive, continue holding labetalol. Can be reassessed as an outpatient. If she needs a beta blocker, propranolol might be a better option because of her varices.   Discharge Vitals:   BP 113/67   Pulse 81   Temp 98.5 F (36.9 C) (Oral)   Resp 18   SpO2 96%   Gen.  Well-developed, well-nourished lady, in no acute distress. Chest. Clear bilaterally. CVS. Regular rate and rhythm. Abdomen. Soft, nontender, nondistended, ventral abdominal wall hernia, bowel sounds positive. Extremities. No edema, no cyanosis, pulses 2+ bilaterally.  Pertinent Labs, Studies, and Procedures:  CBC Latest Ref Rng & Units 05/27/2016 05/15/2016 04/21/2016  WBC 4.0 - 10.5 K/uL 13.0(H) 12.4(H) 6.5  Hemoglobin 12.0 - 15.0 g/dL 16.1 - 09.6  Hematocrit 36.0 - 46.0 % 37.2 38.9 36.2  Platelets 150 - 400 K/uL 471(H) 401(H) 193   CMP Latest Ref Rng & Units 05/28/2016 05/27/2016 05/15/2016  Glucose 65 - 99 mg/dL 80 045(W) 99  BUN 6 - 20 mg/dL 6 9 10   Creatinine 0.44 - 1.00 mg/dL 0.98 1.19(J) 4.78  Sodium 135 - 145 mmol/L 137 135 136  Potassium 3.5 - 5.1 mmol/L 3.7 3.9 4.4  Chloride 101 - 111 mmol/L 112(H) 105 100  CO2 22 - 32 mmol/L 18(L) 17(L) 16(L)  Calcium 8.9 - 10.3 mg/dL 8.0(L) 9.2 9.0  Total Protein 6.5 - 8.1 g/dL 2.9(F) 7.0 -  Total Bilirubin 0.3 - 1.2 mg/dL 1.0 6.2(Z) -  Alkaline Phos 38 - 126 U/L 136(H) 179(H) -  AST 15 - 41 U/L 22 34 -  ALT 14 - 54 U/L 15 22 -   Urinalysis    Component Value Date/Time   COLORURINE YELLOW 05/27/2016 1425   APPEARANCEUR HAZY (A) 05/27/2016 1425   LABSPEC >1.046 (H) 05/27/2016 1425   PHURINE 6.0 05/27/2016 1425   GLUCOSEU NEGATIVE 05/27/2016 1425   HGBUR SMALL (A) 05/27/2016 1425   BILIRUBINUR NEGATIVE 05/27/2016 1425   BILIRUBINUR mod 05/15/2016 1518   KETONESUR NEGATIVE 05/27/2016 1425   PROTEINUR NEGATIVE 05/27/2016 1425   UROBILINOGEN 1.0 05/15/2016 1518   NITRITE NEGATIVE 05/27/2016 1425   LEUKOCYTESUR MODERATE (A) 05/27/2016 1425   Lipid Panel     Component Value Date/Time   CHOL 130 05/27/2016 0555   TRIG 96 05/27/2016 0555   HDL 37 (L) 05/27/2016 0555   CHOLHDL 3.5 05/27/2016 0555   VLDL 19 05/27/2016 0555   LDLCALC 74 05/27/2016 0555   Misc. Labs:  HIV antibody nonreactive Alpha-fetoprotein pending Lipase. 391    Imaging results:  Ct Abdomen Pelvis W Contrast  Result Date: 05/27/2016 CLINICAL DATA:  Abdominal pain, history ethanol abuse, chronic pancreatitis, former smoker, asthma EXAM: CT ABDOMEN AND PELVIS WITH CONTRAST TECHNIQUE: Multidetector CT imaging of the abdomen and pelvis was performed using the standard protocol following bolus administration of intravenous contrast. Sagittal and coronal MPR images reconstructed from axial data set. CONTRAST:  ISOVUE-300 IOPAMIDOL (ISOVUE-300) INJECTION 61% IV. No oral contrast. COMPARISON:  None FINDINGS: Lower chest: Bibasilar atelectasis greater on RIGHT Hepatobiliary: Probable cirrhotic liver. Gallbladder surgically absent. Questionable area of low attenuation within the RIGHT lobe, poorly defined, approximately 3.4 cm in diameter, mass not excluded. Pancreas: Few scattered calcifications consistent with chronic calcific pancreatitis. Superimposed acute pancreatitis with significant peripancreatic edema extending to liver, duodenum, stomach and spleen. No pancreatic hemorrhage or necrosis. Chronic thrombosis of the  splenic vein and confluence with the superior mesenteric vein and proximal portal vein. Main portal vein reconstitutes at the porta hepatis. Spleen: Normal appearance Adrenals/Urinary Tract: Calcification centrally in RIGHT adrenal gland. Adrenal glands otherwise normal appearance. Kidneys, ureters and bladder normal appearance. Stomach/Bowel: Segment of transverse colon extends into a ventral hernia in the epigastrium without evidence of obstruction. Normal appendix. Stomach decompressed with enhancing collaterals vs. varices noted at the posterior gastric wall. No definite paraesophageal varices seen. Small bowel loops unremarkable. Vascular/Lymphatic: Aorta normal caliber. IVC filter noted. 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 Reproductive: Unremarkable uterus and  RIGHT adnexa. Small LEFT ovarian cyst. Other: Small amount of perihepatic and perisplenic free fluid. Fluid adjacent to the lateral margin of the spleen is questionably loculated, cannot exclude developing pseudocyst 5.1 x 2.4 x 4.1 cm. Musculoskeletal: No acute osseous findings. 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 Significant peripancreatic edema with questionable developing pseudocysts lateral to the spleen. 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 Supraumbilical ventral hernia containing a nonobstructed segment of transverse colon. Bibasilar atelectasis greater on RIGHT. Suspected cirrhotic liver with an area of low attenuation within the RIGHT lobe at least 3.4 cm in size, cannot exclude mass lesion; followup non emergent MR imaging of the abdomen with and without contrast recommended to exclude hepatic neoplasm. Electronically Signed   By: Ulyses SouthwardMark  Boles M.D.   On: 05/27/2016 09:46    Discharge Instructions: Discharge Instructions    Diet - low sodium heart healthy    Complete by:  As directed    Discharge instructions    Complete by:  As directed    It was pleasure taking care of you. Please follow up in clinic according to your existing appointment on next Monday. I am holding your blood pressure medicine, we can reevaluate you during your follow-up visit. Please keep yourself well hydrated, I am giving you a small amount of nausea and pain medicine you can use it if needed. As we talked this morning, we will call you with any abnormal results. If your blood results are normal, your doctor can discuss with you about future plan regarding further investigating a small lesion on your liver which we found during CT of your belly during this hospital admission.   Increase activity slowly    Complete by:  As directed      This record has been created using [employer] voice  recognition software. Errors have been sought and corrected,but may not always be located. Such creation errors do not reflect on the standard of care.  Signed: Arnetha CourserSumayya Georgiann Neider, MD 05/28/2016, 3:16 PM   Pager: [phone]

## 2016-05-28 NOTE — Progress Notes (Signed)
Sharon MealingVictoria Kent discharged Home per MD order.  Discharge instructions reviewed and discussed with the patient, all questions and concerns answered. Copy of instructions, care notes  and scripts given to patient.  Allergies as of 05/28/2016      Reactions   Morphine Nausea Only   Oxycodone-acetaminophen Other (See Comments)   Makes her "loopy."      Medication List    STOP taking these medications   amLODipine 10 MG tablet Commonly known as:  NORVASC   labetalol 100 MG tablet Commonly known as:  NORMODYNE   ROBITUSSIN COUGH+CHEST CONG DM 5-100 MG/5ML Liqd Generic drug:  Dextromethorphan-Guaifenesin     TAKE these medications   acetaminophen 325 MG tablet Commonly known as:  TYLENOL Take 650 mg by mouth every 6 (six) hours as needed for moderate pain.   ibuprofen 200 MG tablet Commonly known as:  ADVIL,MOTRIN Take 400 mg by mouth every 6 (six) hours as needed for mild pain.   lipase/protease/amylase 1610912000 units Cpep capsule Commonly known as:  CREON Take 24,000 Units by mouth 3 (three) times daily with meals.   multivitamin with minerals tablet Take 1 tablet by mouth daily.   ondansetron 4 MG tablet Commonly known as:  ZOFRAN Take 1 tablet (4 mg total) by mouth every 6 (six) hours as needed for nausea. What changed:  when to take this  reasons to take this   pantoprazole 40 MG tablet Commonly known as:  PROTONIX Take 1 tablet (40 mg total) by mouth daily.   potassium chloride SA 20 MEQ tablet Commonly known as:  K-DUR,KLOR-CON Take 1 tablet (20 mEq total) by mouth 2 (two) times daily. What changed:  when to take this   traMADol 50 MG tablet Commonly known as:  ULTRAM Take 1 tablet (50 mg total) by mouth every 8 (eight) hours as needed. What changed:  reasons to take this       Patients skin is clean, dry and intact, no evidence of skin break down. IV site discontinued and catheter remains intact. Site without signs and symptoms of complications. Dressing  and pressure applied.  Patient escorted to car by NT in a wheelchair,  no distress noted upon discharge.  Sharon Kent, Sharon Kent 05/28/2016 3:36 PM

## 2016-05-29 LAB — AFP TUMOR MARKER: AFP-Tumor Marker: 11.7 ng/mL — ABNORMAL HIGH (ref 0.0–8.3)

## 2016-05-30 ENCOUNTER — Emergency Department (HOSPITAL_COMMUNITY)
Admission: EM | Admit: 2016-05-30 | Discharge: 2016-05-30 | Disposition: A | Discharge status: Home / Self Care | Payer: Managed Care, Other (non HMO) | Attending: Physician Assistant | Admitting: Physician Assistant

## 2016-05-30 ENCOUNTER — Encounter (HOSPITAL_COMMUNITY): Payer: Self-pay

## 2016-05-30 DIAGNOSIS — J45909 Unspecified asthma, uncomplicated: Secondary | ICD-10-CM | POA: Diagnosis not present

## 2016-05-30 DIAGNOSIS — R109 Unspecified abdominal pain: Secondary | ICD-10-CM | POA: Diagnosis present

## 2016-05-30 DIAGNOSIS — Z87891 Personal history of nicotine dependence: Secondary | ICD-10-CM | POA: Diagnosis not present

## 2016-05-30 DIAGNOSIS — K861 Other chronic pancreatitis: Secondary | ICD-10-CM | POA: Diagnosis not present

## 2016-05-30 LAB — COMPREHENSIVE METABOLIC PANEL
ALBUMIN: 2.6 g/dL — AB (ref 3.5–5.0)
ALT: 14 U/L (ref 14–54)
AST: 21 U/L (ref 15–41)
Alkaline Phosphatase: 163 U/L — ABNORMAL HIGH (ref 38–126)
Anion gap: 11 (ref 5–15)
BILIRUBIN TOTAL: 1.2 mg/dL (ref 0.3–1.2)
CO2: 17 mmol/L — AB (ref 22–32)
Calcium: 8.8 mg/dL — ABNORMAL LOW (ref 8.9–10.3)
Chloride: 109 mmol/L (ref 101–111)
Creatinine, Ser: 0.82 mg/dL (ref 0.44–1.00)
GFR calc Af Amer: >60 mL/min (ref >60)
GFR calc non Af Amer: >60 mL/min (ref >60)
GLUCOSE: 120 mg/dL — AB (ref 65–99)
POTASSIUM: 3.2 mmol/L — AB (ref 3.5–5.1)
Sodium: 137 mmol/L (ref 135–145)
TOTAL PROTEIN: 6.6 g/dL (ref 6.5–8.1)

## 2016-05-30 LAB — CBC
HEMATOCRIT: 34.4 % — AB (ref 36.0–46.0)
Hemoglobin: 11.5 g/dL — ABNORMAL LOW (ref 12.0–15.0)
MCH: 34.2 pg — AB (ref 26.0–34.0)
MCHC: 33.4 g/dL (ref 30.0–36.0)
MCV: 102.4 fL — AB (ref 78.0–100.0)
Platelets: 425 10*3/uL — ABNORMAL HIGH (ref 150–400)
RBC: 3.36 MIL/uL — ABNORMAL LOW (ref 3.87–5.11)
RDW: 15.2 % (ref 11.5–15.5)
WBC: 14.2 10*3/uL — ABNORMAL HIGH (ref 4.0–10.5)

## 2016-05-30 LAB — LIPASE, BLOOD: Lipase: 344 U/L — ABNORMAL HIGH (ref 11–51)

## 2016-05-30 MED ORDER — OXYCODONE-ACETAMINOPHEN 5-325 MG PO TABS: 1.0000 | ORAL_TABLET | Freq: Four times a day (QID) | ORAL | 0 refills | Status: DC | PRN

## 2016-05-30 MED ORDER — OXYCODONE-ACETAMINOPHEN 5-325 MG PO TABS
1.0000 | ORAL_TABLET | Freq: Once | ORAL | Status: AC
  Administered 2016-05-30: 1 via ORAL
  Filled 2016-05-30: qty 1

## 2016-05-30 MED ORDER — ONDANSETRON 4 MG PO TBDP: 4.0000 mg | ORAL_TABLET | Freq: Three times a day (TID) | ORAL | 0 refills | Status: DC | PRN

## 2016-05-30 MED ORDER — ONDANSETRON 4 MG PO TBDP
4.0000 mg | ORAL_TABLET | Freq: Once | ORAL | Status: AC
  Administered 2016-05-30: 4 mg via ORAL
  Filled 2016-05-30: qty 1

## 2016-05-30 NOTE — ED Provider Notes (Signed)
[facility]-EMERGENCY DEPT Provider Note   CSN: [id number] Arrival date & time: 05/30/16  1416     History   Chief Complaint Chief Complaint  Patient presents with  . Abdominal Pain  . Emesis    HPI Sharon MealingVictoria Kent is a 56 y.o. female.  HPI   Patient is a 56 year old female with history of chronic pancreatitis presenting today with residual pain. Patient recently had admission was discharged 2 days ago. Discharge her on tramadol and Zofran by mouth. Unfortunately the patient feels like she is unable to take the Zofran, she would like the dissolvable one. In addition she feels the tramadol and is NOT adequately treating her pain. She called for an appointment is unable to get one for the next couple days.  Eating and drinking just fine, has no other symptoms.  Past Medical History:  Diagnosis Date  . Asthma   . Former smoker   . H/O chronic pancreatitis   . H/O ETOH abuse     Patient Active Problem List   Diagnosis Date Noted  . Acute on chronic pancreatitis (HCC) 05/27/2016  . Acute left flank pain 05/15/2016  . Dyspnea   . Community acquired pneumonia of left lower lobe of lung (HCC)   . Hypokalemia   . History of pancreatitis 04/21/2016  . Asthma 04/21/2016  . 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 S/P cholecystectomy 04/21/2016  . Tobacco abuse 04/21/2016  . Alcohol abuse 04/21/2016    Past Surgical History:  Procedure Laterality Date  . CHOLECYSTECTOMY    . IVC FILTER PLACEMENT ([facility] HX)  12/2010  . PANCREAS SURGERY      OB History    No data available       Home Medications    Prior to Admission medications   Medication Sig Start Date End Date Taking? Authorizing Provider  acetaminophen (TYLENOL) 325 MG tablet Take 650 mg by mouth every 6 (six) hours as needed for moderate pain.     Historical Provider, MD  ibuprofen (ADVIL,MOTRIN) 200 MG tablet Take 400 mg by mouth every 6 (six) hours as needed for mild pain.     Historical Provider, MD    lipase/protease/amylase (CREON) 12000 units CPEP capsule Take 24,000 Units by mouth 3 (three) times daily with meals. 11/27/15   Historical Provider, MD  Multiple Vitamins-Minerals (MULTIVITAMIN WITH MINERALS) tablet Take 1 tablet by mouth daily.    Historical Provider, MD  ondansetron (ZOFRAN) 4 MG tablet Take 1 tablet (4 mg total) by mouth every 6 (six) hours as needed for nausea. 05/28/16   Arnetha CourserSumayya Amin, MD  pantoprazole (PROTONIX) 40 MG tablet Take 1 tablet (40 mg total) by mouth daily. 05/15/16   Alexa Lucrezia Starch Burns, MD  potassium chloride SA (K-DUR,KLOR-CON) 20 MEQ tablet Take 1 tablet (20 mEq total) by mouth 2 (two) times daily. Patient taking differently: Take 20 mEq by mouth daily.  04/27/16   Eulah PontNina Blum, MD  traMADol (ULTRAM) 50 MG tablet Take 1 tablet (50 mg total) by mouth every 8 (eight) hours as needed. 05/28/16   Arnetha CourserSumayya Amin, MD    Family History Family History  Problem Relation Age of Onset  . Adopted: Yes    Social History Social History  Substance Use Topics  . Smoking status: Former Smoker    Packs/day: 1.00    Years: 15.00    Quit date: 04/11/2015  . Smokeless tobacco: Never Used  . Alcohol use Yes     Comment: infrequent 1 glass every several months  Allergies   Morphine and Oxycodone-acetaminophen   Review of Systems Review of Systems  Constitutional: Negative for activity change.  Respiratory: Negative for shortness of breath.   Cardiovascular: Negative for chest pain.  Gastrointestinal: Positive for abdominal pain. Negative for nausea and vomiting.  All other systems reviewed and are negative.    Physical Exam Updated Vital Signs BP 128/88 (BP Location: Right Arm)   Pulse 72   Temp 97.7 F (36.5 C) (Oral)   Resp 12   SpO2 93%   Physical Exam  Constitutional: She is oriented to person, place, and time. She appears well-developed and well-nourished.  HENT:  Head: Normocephalic and atraumatic.  Eyes: Right eye exhibits no discharge.  Cardiovascular:  Normal rate.   Pulmonary/Chest: Effort normal and breath sounds normal. No respiratory distress.  Abdominal: Soft. Bowel sounds are normal. She exhibits no distension. There is no tenderness.  Neurological: She is oriented to person, place, and time.  Skin: Skin is warm and dry. She is not diaphoretic.  Psychiatric: She has a normal mood and affect.  Nursing note and vitals reviewed.    ED Treatments / Results  Labs (all labs ordered are listed, but only abnormal results are displayed) Labs Reviewed  LIPASE, BLOOD - Abnormal; Notable for the following:       Result Value   Lipase 344 (*)    All other components within normal limits  COMPREHENSIVE METABOLIC PANEL - Abnormal; Notable for the following:    Potassium 3.2 (*)    CO2 17 (*)    Glucose, Bld 120 (*)    BUN <5 (*)    Calcium 8.8 (*)    Albumin 2.6 (*)    Alkaline Phosphatase 163 (*)    All other components within normal limits  CBC - Abnormal; Notable for the following:    WBC 14.2 (*)    RBC 3.36 (*)    Hemoglobin 11.5 (*)    HCT 34.4 (*)    MCV 102.4 (*)    MCH 34.2 (*)    Platelets 425 (*)    All other components within normal limits    EKG  EKG Interpretation None       Radiology No results found.  Procedures Procedures (including critical care time)  Medications Ordered in ED Medications  oxyCODONE-acetaminophen (PERCOCET/ROXICET) 5-325 MG per tablet 1 tablet (1 tablet Oral Given 05/30/16 1850)  ondansetron (ZOFRAN-ODT) disintegrating tablet 4 mg (4 mg Oral Given 05/30/16 1850)     Initial Impression / Assessment and Plan / ED Course  I have reviewed the triage vital signs and the nursing notes.  Pertinent labs & imaging results that were available during my care of the patient were reviewed by me and considered in my medical decision making (see chart for details).     She is very well-appearing 56 year old female with chronic pink or tightness. She was discharged home with inadequate  symptom management. We will switch her Zofran to ODT. We will give her several days of Percocet in order to bridge her until her next primary care appointmetn. Patient's been eating drinking normally normal vital signs reassuring physical exam.  Final Clinical Impressions(s) / ED Diagnoses   Final diagnoses:  None    New Prescriptions New Prescriptions   No medications on file     Malika Demario Randall An, MD 05/30/16 1858

## 2016-05-30 NOTE — Discharge Instructions (Signed)
Return to her primary care physician for any additional pain medications. If you cannot tolerate food or liquid please return.

## 2016-05-30 NOTE — ED Triage Notes (Signed)
Patient complains of recurrent epigastric pain after being discharged from hospital on Sunday for pancreatitis. Complains of severe pain with nausea and vomiting. States I cannot keep any of my meds down

## 2016-05-30 NOTE — ED Notes (Signed)
Checked with pt.  No distress at this time, she reports that her nausea is decreased and her abdominal pain is "easing off"

## 2016-06-02 ENCOUNTER — Telehealth: Payer: Self-pay

## 2016-06-02 NOTE — Telephone Encounter (Signed)
APT. REMINDER CALL, LMTCB °

## 2016-06-05 ENCOUNTER — Encounter: Payer: Self-pay | Admitting: Internal Medicine

## 2016-06-05 ENCOUNTER — Ambulatory Visit (INDEPENDENT_AMBULATORY_CARE_PROVIDER_SITE_OTHER): Payer: Managed Care, Other (non HMO) | Admitting: Internal Medicine

## 2016-06-05 DIAGNOSIS — E876 Hypokalemia: Secondary | ICD-10-CM | POA: Diagnosis not present

## 2016-06-05 DIAGNOSIS — Z79899 Other long term (current) drug therapy: Secondary | ICD-10-CM

## 2016-06-05 DIAGNOSIS — K7460 Unspecified cirrhosis of liver: Secondary | ICD-10-CM

## 2016-06-05 DIAGNOSIS — Z8679 Personal history of other diseases of the circulatory system: Secondary | ICD-10-CM | POA: Diagnosis not present

## 2016-06-05 DIAGNOSIS — J45909 Unspecified asthma, uncomplicated: Secondary | ICD-10-CM

## 2016-06-05 DIAGNOSIS — Z5189 Encounter for other specified aftercare: Secondary | ICD-10-CM | POA: Diagnosis not present

## 2016-06-05 DIAGNOSIS — F17210 Nicotine dependence, cigarettes, uncomplicated: Secondary | ICD-10-CM

## 2016-06-05 DIAGNOSIS — F1011 Alcohol abuse, in remission: Secondary | ICD-10-CM

## 2016-06-05 DIAGNOSIS — K769 Liver disease, unspecified: Secondary | ICD-10-CM | POA: Diagnosis not present

## 2016-06-05 DIAGNOSIS — K7030 Alcoholic cirrhosis of liver without ascites: Secondary | ICD-10-CM | POA: Insufficient documentation

## 2016-06-05 DIAGNOSIS — K860 Alcohol-induced chronic pancreatitis: Secondary | ICD-10-CM

## 2016-06-05 MED ORDER — ALBUTEROL SULFATE 108 (90 BASE) MCG/ACT IN AEPB: 2.0000 | INHALATION_SPRAY | RESPIRATORY_TRACT | 11 refills | Status: DC | PRN

## 2016-06-05 NOTE — Assessment & Plan Note (Signed)
Patient has a history of hypertension and was previously on amlodipine and labetalol which was started during a hospital admission in [place]. I'll admitted, patient had normal blood pressures controlled off of antihypertensives. Therefore, these medications were discontinued. Today, her blood pressure is 136/87 and she has not been taking blood pressure medications. I think is reasonable to continue to monitor her blood pressure. Patient does have a blood pressure cuff at home.  Plan: -Continue to monitor

## 2016-06-05 NOTE — Assessment & Plan Note (Addendum)
Patient was recently admitted to the hospital for acute on chronic alcoholic pancreatitis. Patient was given IV fluids, Zofran, and pain control with Dilaudid and had improvement in her pain and inability to tolerate by mouth intake. She was discharged on February 18 with tramadol, ibuprofen, Tylenol and Zofran. She was instructed to avoid alcohol use. She presented to the emergency department again on February 20 with complaint of uncontrolled pain. Patient doing this as inadequate outpatient medication management. She was given Percocet. Today, patient states she is doing well. She denies any alcohol use. She has been compliant with her Creon. She only takes Percocet when she absolutely needs to home, otherwise she will typically use ibuprofen or tramadol. She denies any pain today. No abdominal or epigastric pain on exam.  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

## 2016-06-05 NOTE — Patient Instructions (Signed)
For your pancreatitis: Continue to take the Creon. Please avoid all alcohol use.  We will check your potassium today.  For your liver lesion, we will order an MRI of your abdomen. You will be contacted to schedule this.  For your asthma, I have given new albuterol inhaler. We are also ordering pulmonary function testing.  Please follow up in 3 months.

## 2016-06-05 NOTE — Assessment & Plan Note (Addendum)
Patient reports a history of asthma for the past 10 years. Patient does smoke a half pack per day of cigarettes for the past 30 years. She denies history of COPD, but denies ever having pulmonary function testing. She admits to occasional dyspnea on exertion, wheezing and shortness of breath especially during the winter months or when the seasons change. Lungs are clear on examination. Patient is satting 97% on room air.  Plan: -Obtain PFTs to characterize shortness of breath and possibility of COPD -Given albuterol inhaler

## 2016-06-05 NOTE — Progress Notes (Signed)
    CC: Follow-up for pancreatitis  HPI: Sharon Kent is a 56 y.o. female with PMHx of chronic alcoholic pancreatitis, asthma who presents to the clinic for follow-up for pancreatitis.   Patient was recently admitted to the hospital for acute on chronic alcoholic pancreatitis. Patient was given IV fluids, Zofran, and pain control with Dilaudid and had improvement in her pain and inability to tolerate by mouth intake. She was discharged on February 18 with tramadol, ibuprofen, Tylenol and Zofran. She was instructed to avoid alcohol use. She presented to the emergency department again on February 20 with complaint of uncontrolled pain. Patient doing this as inadequate outpatient medication management. She was given Percocet. Today, patient states she is doing well. She denies any alcohol use. She has been compliant with her Creon. She only takes Percocet when she absolutely needs to home, otherwise she will typically use ibuprofen or tramadol. She denies any pain today.  While inpatient, patient had a CT scan of her abdomen which revealed a 3.4 cm right lobe liver lesion. AFP was mildly elevated at 11.7. Recommendations were to obtain a nonemergent MRI of her abdomen to better characterize this lesion.  Patient reports a history of asthma for the past 10 years. Patient does smoke a half pack per day of cigarettes for the past 30 years. She denies history of COPD, but denies ever having pulmonary function testing. She admits to occasional dyspnea on exertion, wheezing and shortness of breath especially during the winter months or when the seasons change.   Past Medical History:  Diagnosis Date  . Asthma   . Former smoker   . H/O chronic pancreatitis   . H/O ETOH abuse      Review of Systems: Please see pertinent ROS reviewed in HPI and problem based charting.   Physical Exam: Vitals:   06/05/16 0819  BP: 136/87  Pulse: 77  Temp: 98.8 F (37.1 C)  TempSrc: Oral  SpO2: 97%    Weight: 171 lb 8 oz (77.8 kg)  Height: 5\' 6"  (1.676 m)   General: Vital signs reviewed.  Patient is well-developed and well-nourished, in no acute distress and cooperative with exam.  Cardiovascular: RRR, S1 normal, S2 normal, no murmurs, gallops, or rubs. Pulmonary/Chest: Clear to auscultation bilaterally, no wheezes, rales, or rhonchi. Abdominal: Soft, non-tender, non-distended, BS +, no guarding present.  Extremities: No lower extremity edema bilaterally Skin: Warm, dry and intact. No rashes or erythema. Psychiatric: Normal mood and affect. speech and behavior is normal. Cognition and memory are normal.   Assessment & Plan:  See encounters tab for problem based medical decision making. Patient discussed with Dr. Cleda DaubE. Hoffman

## 2016-06-05 NOTE — Assessment & Plan Note (Addendum)
While inpatient, patient had a CT scan of her abdomen which revealed a 3.4 cm right lobe liver lesion. AFP was mildly elevated at 11.7. Recommendations were to obtain a nonemergent MRI of her abdomen to better characterize this lesion.  Assessment: Liver lesion  Plan: -Obtain MRI abdomen with and without contrast

## 2016-06-05 NOTE — Assessment & Plan Note (Addendum)
Potassium 3.2 on 2/20.   Plan: -Repeat BMET  Potassium normal at 3.7.

## 2016-06-06 ENCOUNTER — Telehealth: Payer: Self-pay | Admitting: *Deleted

## 2016-06-06 ENCOUNTER — Encounter: Payer: Self-pay | Admitting: Internal Medicine

## 2016-06-06 LAB — BMP8+ANION GAP
Anion Gap: 18 mmol/L (ref 10.0–18.0)
BUN/Creatinine Ratio: 5 — ABNORMAL LOW (ref 9–23)
BUN: 4 mg/dL — AB (ref 6–24)
CALCIUM: 8.2 mg/dL — AB (ref 8.7–10.2)
CHLORIDE: 100 mmol/L (ref 96–106)
CO2: 18 mmol/L (ref 18–29)
CREATININE: 0.77 mg/dL (ref 0.57–1.00)
GFR calc Af Amer: 101 mL/min/{1.73_m2} (ref >59)
GFR calc non Af Amer: 87 mL/min/{1.73_m2} (ref >59)
GLUCOSE: 80 mg/dL (ref 65–99)
Potassium: 3.7 mmol/L (ref 3.5–5.2)
Sodium: 136 mmol/L (ref 134–144)

## 2016-06-06 NOTE — Telephone Encounter (Signed)
Call to patient to notify her of her appointment for PFT's. Message was left on patient's answering machine to call Venita SheffieldGladys in the Clinics for the appointment time and date.  Patient has been scheduled for 06/13/2016 9 AM pt to arrive by 8:45 AM.  [facility] to [facility].  Patient will need not to smoke, use an inhaler or drink caffeine 4 hours prior to the test..  Angelina OkGladys Kipling Graser, RN 06/06/2016 10:17 AM

## 2016-06-08 NOTE — Progress Notes (Signed)
[facility] Medicine Clinic Attending  Case discussed with Dr. Burns soon after the resident saw the patient.  We reviewed the resident's history and exam and pertinent patient test results.  I agree with the assessment, diagnosis, and plan of care documented in the resident's note. 

## 2016-06-10 ENCOUNTER — Emergency Department (HOSPITAL_COMMUNITY)
Admission: EM | Admit: 2016-06-10 | Discharge: 2016-06-10 | Disposition: A | Discharge status: Home / Self Care | Payer: Managed Care, Other (non HMO) | Attending: Emergency Medicine | Admitting: Emergency Medicine

## 2016-06-10 ENCOUNTER — Encounter (HOSPITAL_COMMUNITY): Payer: Self-pay | Admitting: Emergency Medicine

## 2016-06-10 DIAGNOSIS — J45909 Unspecified asthma, uncomplicated: Secondary | ICD-10-CM | POA: Insufficient documentation

## 2016-06-10 DIAGNOSIS — Z87891 Personal history of nicotine dependence: Secondary | ICD-10-CM | POA: Insufficient documentation

## 2016-06-10 DIAGNOSIS — K859 Acute pancreatitis without necrosis or infection, unspecified: Secondary | ICD-10-CM | POA: Diagnosis not present

## 2016-06-10 DIAGNOSIS — R1110 Vomiting, unspecified: Secondary | ICD-10-CM | POA: Diagnosis present

## 2016-06-10 DIAGNOSIS — Z79899 Other long term (current) drug therapy: Secondary | ICD-10-CM | POA: Insufficient documentation

## 2016-06-10 LAB — ABO/RH: ABO/RH(D): A NEG

## 2016-06-10 LAB — COMPREHENSIVE METABOLIC PANEL
ALBUMIN: 2.7 g/dL — AB (ref 3.5–5.0)
ALT: 18 U/L (ref 14–54)
AST: 42 U/L — AB (ref 15–41)
Alkaline Phosphatase: 183 U/L — ABNORMAL HIGH (ref 38–126)
Anion gap: 14 (ref 5–15)
CO2: 18 mmol/L — AB (ref 22–32)
CREATININE: 0.81 mg/dL (ref 0.44–1.00)
Calcium: 8.7 mg/dL — ABNORMAL LOW (ref 8.9–10.3)
Chloride: 106 mmol/L (ref 101–111)
GFR calc Af Amer: >60 mL/min (ref >60)
GLUCOSE: 123 mg/dL — AB (ref 65–99)
POTASSIUM: 3.7 mmol/L (ref 3.5–5.1)
Sodium: 138 mmol/L (ref 135–145)
Total Bilirubin: 0.8 mg/dL (ref 0.3–1.2)
Total Protein: 7 g/dL (ref 6.5–8.1)

## 2016-06-10 LAB — LIPASE, BLOOD: Lipase: 136 U/L — ABNORMAL HIGH (ref 11–51)

## 2016-06-10 LAB — CBC
HEMATOCRIT: 37.9 % (ref 36.0–46.0)
Hemoglobin: 12.6 g/dL (ref 12.0–15.0)
MCH: 33.4 pg (ref 26.0–34.0)
MCHC: 33.2 g/dL (ref 30.0–36.0)
MCV: 100.5 fL — AB (ref 78.0–100.0)
PLATELETS: 510 10*3/uL — AB (ref 150–400)
RBC: 3.77 MIL/uL — ABNORMAL LOW (ref 3.87–5.11)
RDW: 15.2 % (ref 11.5–15.5)
WBC: 9.9 10*3/uL (ref 4.0–10.5)

## 2016-06-10 LAB — TYPE AND SCREEN
ABO/RH(D): A NEG
Antibody Screen: NEGATIVE

## 2016-06-10 LAB — ETHANOL

## 2016-06-10 MED ORDER — SODIUM CHLORIDE 0.9 % IV BOLUS (SEPSIS)
1000.0000 mL | Freq: Once | INTRAVENOUS | Status: AC
  Administered 2016-06-10: 1000 mL via INTRAVENOUS

## 2016-06-10 MED ORDER — PROMETHAZINE HCL 25 MG/ML IJ SOLN
25.0000 mg | Freq: Once | INTRAMUSCULAR | Status: AC
  Administered 2016-06-10: 25 mg via INTRAMUSCULAR
  Filled 2016-06-10: qty 1

## 2016-06-10 MED ORDER — MORPHINE SULFATE (PF) 4 MG/ML IV SOLN
4.0000 mg | Freq: Once | INTRAVENOUS | Status: AC
Start: 2016-06-10 — End: 2016-06-10
  Administered 2016-06-10: 4 mg via INTRAVENOUS
  Filled 2016-06-10: qty 1

## 2016-06-10 MED ORDER — PROMETHAZINE HCL 25 MG RE SUPP: 25.0000 mg | Freq: Four times a day (QID) | RECTAL | 0 refills | Status: DC | PRN

## 2016-06-10 NOTE — Discharge Instructions (Signed)
Drink only clear non alcoholic fluids today Advance to sioft diet tomorrow Return to ER for severe or worsening pain or vomiting Phenergan (suppository) for uncontrolled nausea

## 2016-06-10 NOTE — ED Triage Notes (Addendum)
Pt c/o onset about 9 pm last night with abdominal pain that radiates to left flank and vomiting. Pt reports, "I think its my pancreatitis acting up." Pt tried zofran and her prescribed pain medications without relief. Pt reports vomit color is black.

## 2016-06-10 NOTE — ED Notes (Signed)
Called main lab about sent blood specimens, phlebotomist states samples are now being processed.

## 2016-06-10 NOTE — ED Provider Notes (Signed)
[facility] DEPT Provider Note   CSN: [id number] Arrival date & time: 06/10/16  8295     History   Chief Complaint Chief Complaint  Patient presents with  . Emesis  . Back Pain    HPI Sharon Kent is a 56 y.o. female.  HPI  The pt has known pancreatitis - in fact she has been seen 4 times in the last 2 months for this - states that she was diagnosed with severe pancreatitis in [place] where she used to live, And states that she had been admitted for over 3 months, underwent a surgical procedure and has had intermittent pancreatitis since that time. She reports that her last drink of alcohol was one month ago. 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 She is alert he had her gallbladder out. She states that she started to have pain approximately 12 hours ago, this was last night, she had vomiting throughout the night. The pain is located in the left upper quadrant, radiates to the left flank, similar to prior pancreatitis. Vomiting was not controlled with dissolvable Zofran. The patient denies fevers, there is no right-sided tenderness, there is no coughing or shortness of breath or chest pain. She reports this is similar to prior episodes of pancreatitis. Her last evaluation was 10 days ago in the emergency department at which time the patient was found to have a slightly elevated lipase but was tolerating food and fluids. She was given a short course of Percocet and Zofran for the outpatient setting.  Past Medical History:  Diagnosis Date  . Asthma   . Former smoker   . H/O chronic pancreatitis   . H/O ETOH abuse     Patient Active Problem List   Diagnosis Date Noted  . Hepatic lesion 06/05/2016  . History of hypertension 06/05/2016  . Possible Cirrhosis 06/05/2016  . Hypokalemia   . Chronic pancreatitis (HCC) 04/21/2016  . Asthma 04/21/2016  . 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 S/P cholecystectomy 04/21/2016  . Tobacco abuse 04/21/2016  .  Alcohol abuse 04/21/2016    Past Surgical History:  Procedure Laterality Date  . CHOLECYSTECTOMY    . IVC FILTER PLACEMENT ([facility] HX)  12/2010  . PANCREAS SURGERY      OB History    No data available       Home Medications    Prior to Admission medications   Medication Sig Start Date End Date Taking? Authorizing Provider  Albuterol Sulfate 108 (90 Base) MCG/ACT AEPB Inhale 2 puffs into the lungs as needed. Patient taking differently: Inhale 2 puffs into the lungs every 4 (four) hours as needed (for shortness of breath).  06/05/16  Yes Alexa Lucrezia Starch, MD  lipase/protease/amylase (CREON) 12000 units CPEP capsule Take 24,000 Units by mouth 3 (three) times daily with meals. 11/27/15  Yes Historical Provider, MD  Multiple Vitamins-Minerals (MULTIVITAMIN WITH MINERALS) tablet Take 1 tablet by mouth daily.   Yes Historical Provider, MD  ondansetron (ZOFRAN ODT) 4 MG disintegrating tablet Take 1 tablet (4 mg total) by mouth every 8 (eight) hours as needed for nausea or vomiting. 05/30/16  Yes Courteney Lyn Mackuen, MD  oxyCODONE-acetaminophen (PERCOCET/ROXICET) 5-325 MG tablet Take 1 tablet by mouth every 6 (six) hours as needed for severe pain. 05/30/16  Yes Courteney Lyn Mackuen, MD  pantoprazole (PROTONIX) 40 MG tablet Take 1 tablet (40 mg total) by mouth daily. 05/15/16  Yes Alexa Lucrezia Starch, MD  potassium chloride SA (K-DUR,KLOR-CON) 20 MEQ tablet Take 1 tablet (20  mEq total) by mouth 2 (two) times daily. Patient taking differently: Take 20 mEq by mouth daily.  04/27/16  Yes Eulah PontNina Blum, MD  traMADol (ULTRAM) 50 MG tablet Take 1 tablet (50 mg total) by mouth every 8 (eight) hours as needed. Patient taking differently: Take 50 mg by mouth every 8 (eight) hours as needed for moderate pain.  05/28/16  Yes Arnetha CourserSumayya Amin, MD  acetaminophen (TYLENOL) 325 MG tablet Take 650 mg by mouth every 6 (six) hours as needed for moderate pain.     Historical Provider, MD  ibuprofen (ADVIL,MOTRIN) 200 MG tablet Take 400  mg by mouth every 6 (six) hours as needed for mild pain.     Historical Provider, MD  ondansetron (ZOFRAN) 4 MG tablet Take 1 tablet (4 mg total) by mouth every 6 (six) hours as needed for nausea. Patient not taking: Reported on 06/10/2016 05/28/16   Arnetha CourserSumayya Amin, MD  promethazine (PHENERGAN) 25 MG suppository Place 1 suppository (25 mg total) rectally every 6 (six) hours as needed for nausea or vomiting. 06/10/16   Eber HongBrian Melaina Howerton, MD    Family History Family History  Problem Relation Age of Onset  . Adopted: Yes    Social History Social History  Substance Use Topics  . Smoking status: Former Smoker    Packs/day: 1.00    Years: 15.00    Quit date: 04/11/2015  . Smokeless tobacco: Never Used  . Alcohol use Yes     Comment: infrequent 1 glass every several months     Allergies   Morphine and Oxycodone-acetaminophen   Review of Systems Review of Systems  All other systems reviewed and are negative.    Physical Exam Updated Vital Signs BP 116/79   Pulse 80   Temp 98 F (36.7 C)   Resp 22   Ht 5\' 6"  (1.676 m)   Wt 165 lb (74.8 kg)   SpO2 95%   BMI 26.63 kg/m   Physical Exam  Constitutional: She appears well-developed and well-nourished. No distress.  HENT:  Head: Normocephalic and atraumatic.  Mouth/Throat: Oropharynx is clear and moist. No oropharyngeal exudate.  Eyes: Conjunctivae and EOM are normal. Pupils are equal, round, and reactive to light. Right eye exhibits no discharge. Left eye exhibits no discharge. No scleral icterus.  Neck: Normal range of motion. Neck supple. No JVD present. No thyromegaly present.  Cardiovascular: Normal rate, regular rhythm, normal heart sounds and intact distal pulses.  Exam reveals no gallop and no friction rub.   No murmur heard. Pulmonary/Chest: Effort normal and breath sounds normal. No respiratory distress. She has no wheezes. She has no rales.  Abdominal: Soft. Bowel sounds are normal. She exhibits no distension and no mass. There  is tenderness.  There is tenderness to palpation of the left upper and left mid abdomen as well as the epigastrium. There is no right upper quadrant or right lower quadrant tenderness. Minimal guarding left side and epigastrium, no peritoneal signs, left CVA tenderness present but mild  Musculoskeletal: Normal range of motion. She exhibits no edema or tenderness.  Lymphadenopathy:    She has no cervical adenopathy.  Neurological: She is alert. Coordination normal.  Skin: Skin is warm and dry. No rash noted. No erythema.  Psychiatric: She has a normal mood and affect. Her behavior is normal.  Nursing note and vitals reviewed.    ED Treatments / Results  Labs (all labs ordered are listed, but only abnormal results are displayed) Labs Reviewed  COMPREHENSIVE METABOLIC PANEL - Abnormal;  Notable for the following:       Result Value   CO2 18 (*)    Glucose, Bld 123 (*)    BUN <5 (*)    Calcium 8.7 (*)    Albumin 2.7 (*)    AST 42 (*)    Alkaline Phosphatase 183 (*)    All other components within normal limits  CBC - Abnormal; Notable for the following:    RBC 3.77 (*)    MCV 100.5 (*)    Platelets 510 (*)    All other components within normal limits  LIPASE, BLOOD - Abnormal; Notable for the following:    Lipase 136 (*)    All other components within normal limits  ETHANOL  POC OCCULT BLOOD, ED  TYPE AND SCREEN  ABO/RH     Radiology No results found.  Procedures Procedures (including critical care time)  Medications Ordered in ED Medications  morphine 4 MG/ML injection 4 mg (4 mg Intravenous Given 06/10/16 0841)  promethazine (PHENERGAN) injection 25 mg (25 mg Intramuscular Given 06/10/16 0841)  sodium chloride 0.9 % bolus 1,000 mL (1,000 mLs Intravenous New Bag/Given 06/10/16 0846)     Initial Impression / Assessment and Plan / ED Course  I have reviewed the triage vital signs and the nursing notes.  Pertinent labs & imaging results that were available during my care  of the patient were reviewed by me and considered in my medical decision making (see chart for details).     The patient's exam and history is consistent with recurrent pancreatitis. She reports that she has not been drinking, I will check an alcohol level as I do not see that one has been checked. She reports 1 month of sobriety. I question this given the recurrent pancreatitis however she could just have severe recurrent disease. Single dose of pain medicine and intramuscular Phenergan with IV fluids has been ordered.  Rectal phenergan for home, labs reviewed and continue to improve over time, lipase now 136, tolerating fluids, IL IVF given, pt expressed understanding of diet and return precautions - sees the IM clinic here.  No Rx for opiates given.  Final Clinical Impressions(s) / ED Diagnoses   Final diagnoses:  Acute pancreatitis, unspecified complication status, unspecified pancreatitis type    New Prescriptions New Prescriptions   PROMETHAZINE (PHENERGAN) 25 MG SUPPOSITORY    Place 1 suppository (25 mg total) rectally every 6 (six) hours as needed for nausea or vomiting.     Eber Hong, MD 06/10/16 1114

## 2016-06-12 ENCOUNTER — Other Ambulatory Visit: Payer: Self-pay | Admitting: Internal Medicine

## 2016-06-12 NOTE — Telephone Encounter (Signed)
Tried to call, no answer, lm for rtc

## 2016-06-12 NOTE — Telephone Encounter (Signed)
Pain med refill °

## 2016-06-13 ENCOUNTER — Encounter (HOSPITAL_COMMUNITY): Payer: Self-pay

## 2016-06-13 ENCOUNTER — Inpatient Hospital Stay (HOSPITAL_COMMUNITY): Admission: RE | Admit: 2016-06-13 | Payer: Managed Care, Other (non HMO) | Source: Ambulatory Visit

## 2016-06-13 ENCOUNTER — Emergency Department (HOSPITAL_COMMUNITY)
Admission: EM | Admit: 2016-06-13 | Discharge: 2016-06-13 | Disposition: A | Discharge status: Home / Self Care | Payer: Managed Care, Other (non HMO) | Source: Home / Self Care | Attending: Emergency Medicine | Admitting: Emergency Medicine

## 2016-06-13 ENCOUNTER — Other Ambulatory Visit: Payer: Self-pay

## 2016-06-13 DIAGNOSIS — J45909 Unspecified asthma, uncomplicated: Secondary | ICD-10-CM

## 2016-06-13 DIAGNOSIS — K859 Acute pancreatitis without necrosis or infection, unspecified: Secondary | ICD-10-CM

## 2016-06-13 DIAGNOSIS — Z79899 Other long term (current) drug therapy: Secondary | ICD-10-CM | POA: Insufficient documentation

## 2016-06-13 DIAGNOSIS — Z87891 Personal history of nicotine dependence: Secondary | ICD-10-CM

## 2016-06-13 HISTORY — DX: Acute pancreatitis without necrosis or infection, unspecified: K85.90

## 2016-06-13 HISTORY — DX: Personal history of pulmonary embolism: Z86.711

## 2016-06-13 LAB — CBC
HCT: 37.7 % (ref 36.0–46.0)
Hemoglobin: 12.4 g/dL (ref 12.0–15.0)
MCH: 33.6 pg (ref 26.0–34.0)
MCHC: 32.9 g/dL (ref 30.0–36.0)
MCV: 102.2 fL — ABNORMAL HIGH (ref 78.0–100.0)
Platelets: 454 10*3/uL — ABNORMAL HIGH (ref 150–400)
RBC: 3.69 MIL/uL — ABNORMAL LOW (ref 3.87–5.11)
RDW: 15.2 % (ref 11.5–15.5)
WBC: 11.7 10*3/uL — ABNORMAL HIGH (ref 4.0–10.5)

## 2016-06-13 LAB — COMPREHENSIVE METABOLIC PANEL
ALT: 15 U/L (ref 14–54)
AST: 29 U/L (ref 15–41)
Albumin: 2.8 g/dL — ABNORMAL LOW (ref 3.5–5.0)
Alkaline Phosphatase: 187 U/L — ABNORMAL HIGH (ref 38–126)
Anion gap: 13 (ref 5–15)
BUN: 6 mg/dL (ref 6–20)
CO2: 17 mmol/L — ABNORMAL LOW (ref 22–32)
Calcium: 9.2 mg/dL (ref 8.9–10.3)
Chloride: 105 mmol/L (ref 101–111)
Creatinine, Ser: 0.87 mg/dL (ref 0.44–1.00)
GFR calc Af Amer: >60 mL/min (ref >60)
GFR calc non Af Amer: >60 mL/min (ref >60)
Glucose, Bld: 110 mg/dL — ABNORMAL HIGH (ref 65–99)
Potassium: 3.6 mmol/L (ref 3.5–5.1)
Sodium: 135 mmol/L (ref 135–145)
Total Bilirubin: 0.8 mg/dL (ref 0.3–1.2)
Total Protein: 7.3 g/dL (ref 6.5–8.1)

## 2016-06-13 LAB — LIPASE, BLOOD: Lipase: 618 U/L — ABNORMAL HIGH (ref 11–51)

## 2016-06-13 LAB — ETHANOL: Alcohol, Ethyl (B): <5 mg/dL (ref <5)

## 2016-06-13 MED ORDER — OXYCODONE HCL 5 MG PO TABS: 5.0000 mg | ORAL_TABLET | Freq: Four times a day (QID) | ORAL | 0 refills | Status: DC | PRN

## 2016-06-13 MED ORDER — HYDROMORPHONE HCL 2 MG/ML IJ SOLN
1.0000 mg | Freq: Once | INTRAMUSCULAR | Status: AC
  Administered 2016-06-13: 1 mg via INTRAVENOUS
  Filled 2016-06-13: qty 1

## 2016-06-13 MED ORDER — KETOROLAC TROMETHAMINE 30 MG/ML IJ SOLN
30.0000 mg | Freq: Once | INTRAMUSCULAR | Status: AC
  Administered 2016-06-13: 30 mg via INTRAVENOUS
  Filled 2016-06-13: qty 1

## 2016-06-13 MED ORDER — ONDANSETRON 4 MG PO TBDP: 4.0000 mg | ORAL_TABLET | Freq: Once | ORAL | Status: DC | PRN

## 2016-06-13 MED ORDER — ONDANSETRON HCL 4 MG/2ML IJ SOLN
4.0000 mg | Freq: Once | INTRAMUSCULAR | Status: AC
  Administered 2016-06-13: 4 mg via INTRAVENOUS
  Filled 2016-06-13: qty 2

## 2016-06-13 MED ORDER — SODIUM CHLORIDE 0.9 % IV BOLUS (SEPSIS)
1000.0000 mL | Freq: Once | INTRAVENOUS | Status: AC
  Administered 2016-06-13: 1000 mL via INTRAVENOUS

## 2016-06-13 NOTE — ED Provider Notes (Signed)
[facility] Provider Note   CSN: [id number] Arrival date & time: 06/13/16  0505     History   Chief Complaint Chief Complaint  Patient presents with  . Abdominal Pain  . Back Pain    HPI Sharon Kent is a 56 y.o. female who presents with abdominal pain, N/V. PMH significant for hx of alcohol induced pancreatitis, cirrhosis of liver, hx of ETOH abuse, hepatic lesion, hx of pancreatic abscess. She is s/p cholecystectomy and pancreas surgery (drainage and debridement of pancreatic abscess at [facility] in 2012). She states her symptoms have been intermittent for the past 3 weeks. This is her 4th presentation to the ED presentation since 2/17 for her symptoms and she has seen her PCP once as well. Goes to [facility]. She was last seen 3 days ago and after fluids and pain medicine was discharged home with phenergan suppository. She adamantly denies any ETOH use since 2 weeks ago. She was doing better after leaving the hospital. She states she slept all day when she got home and then the next day went to work. She works at [employer]. Her pain started to return last night. It is in the epigastric area and radiates to the back and RUQ, consistent with pain she's had from prior episodes of pancreatitis. She reports decreased appetite and recurrent N/V which is now just "dry heaves". She tried to take Zofran and a phenergan suppository with no relief. She denies fever, chills, chest pain, SOB, diarrhea/constipation, dysuria. Of note, she had a CT scan of her abdomen a couple weeks ago which showed a 3.4cm hepatic lesion. She has not been able to get appointment with GI yet.  HPI  Past Medical History:  Diagnosis Date  . Asthma   . Former smoker   . H/O chronic pancreatitis   . H/O ETOH abuse   . Hx of pulmonary embolus   . Pancreatic abscess     Patient Active Problem List   Diagnosis Date Noted  . Hepatic lesion 06/05/2016  . History of hypertension 06/05/2016  . Possible Cirrhosis 06/05/2016    . Hypokalemia   . Chronic pancreatitis (HCC) 04/21/2016  . Asthma 04/21/2016  . 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 S/P cholecystectomy 04/21/2016  . Tobacco abuse 04/21/2016  . Alcohol abuse 04/21/2016    Past Surgical History:  Procedure Laterality Date  . CHOLECYSTECTOMY    . IVC FILTER PLACEMENT ([facility] HX)  12/2010  . PANCREAS SURGERY      OB History    No data available       Home Medications    Prior to Admission medications   Medication Sig Start Date End Date Taking? Authorizing Provider  acetaminophen (TYLENOL) 325 MG tablet Take 650 mg by mouth every 6 (six) hours as needed for moderate pain.     Historical Provider, MD  Albuterol Sulfate 108 (90 Base) MCG/ACT AEPB Inhale 2 puffs into the lungs as needed. Patient taking differently: Inhale 2 puffs into the lungs every 4 (four) hours as needed (for shortness of breath).  06/05/16   Servando Snare, MD  ibuprofen (ADVIL,MOTRIN) 200 MG tablet Take 400 mg by mouth every 6 (six) hours as needed for mild pain.     Historical Provider, MD  lipase/protease/amylase (CREON) 12000 units CPEP capsule Take 24,000 Units by mouth 3 (three) times daily with meals. 11/27/15   Historical Provider, MD  Multiple Vitamins-Minerals (MULTIVITAMIN WITH MINERALS) tablet Take 1 tablet by mouth daily.    Historical Provider, MD  ondansetron (ZOFRAN ODT) 4 MG disintegrating tablet Take 1 tablet (4 mg total) by mouth every 8 (eight) hours as needed for nausea or vomiting. 05/30/16   Courteney Lyn Mackuen, MD  ondansetron (ZOFRAN) 4 MG tablet Take 1 tablet (4 mg total) by mouth every 6 (six) hours as needed for nausea. Patient not taking: Reported on 06/10/2016 05/28/16   Arnetha CourserSumayya Amin, MD  oxyCODONE-acetaminophen (PERCOCET/ROXICET) 5-325 MG tablet Take 1 tablet by mouth every 6 (six) hours as needed for severe pain. 05/30/16   Courteney Lyn Mackuen, MD  pantoprazole (PROTONIX) 40 MG tablet Take 1 tablet (40 mg total) by mouth daily. 05/15/16   Alexa Lucrezia Starch Burns, MD   potassium chloride SA (K-DUR,KLOR-CON) 20 MEQ tablet Take 1 tablet (20 mEq total) by mouth 2 (two) times daily. Patient taking differently: Take 20 mEq by mouth daily.  04/27/16   Eulah PontNina Blum, MD  promethazine (PHENERGAN) 25 MG suppository Place 1 suppository (25 mg total) rectally every 6 (six) hours as needed for nausea or vomiting. 06/10/16   Eber HongBrian Miller, MD  traMADol (ULTRAM) 50 MG tablet Take 1 tablet (50 mg total) by mouth every 8 (eight) hours as needed. Patient taking differently: Take 50 mg by mouth every 8 (eight) hours as needed for moderate pain.  05/28/16   Arnetha CourserSumayya Amin, MD    Family History Family History  Problem Relation Age of Onset  . Adopted: Yes    Social History Social History  Substance Use Topics  . Smoking status: Former Smoker    Packs/day: 1.00    Years: 15.00    Quit date: 04/11/2015  . Smokeless tobacco: Never Used  . Alcohol use Yes     Comment: infrequent 1 glass every several months     Allergies   Morphine and Oxycodone-acetaminophen   Review of Systems Review of Systems  Constitutional: Positive for appetite change. Negative for chills and fever.  Respiratory: Negative for shortness of breath.   Cardiovascular: Negative for chest pain.  Gastrointestinal: Positive for abdominal pain, nausea and vomiting.     Physical Exam Updated Vital Signs BP (!) 159/114   Pulse 80   Temp 97.8 F (36.6 C) (Oral)   Resp 21   Ht 5\' 6"  (1.676 m)   Wt 74.8 kg   SpO2 100%   BMI 26.63 kg/m   Physical Exam  Constitutional: She is oriented to person, place, and time. She appears well-developed and well-nourished. She appears distressed (Bent over, sitting in bed).  HENT:  Head: Normocephalic and atraumatic.  Eyes: Conjunctivae are normal. Pupils are equal, round, and reactive to light. Right eye exhibits no discharge. Left eye exhibits no discharge. No scleral icterus.  Neck: Normal range of motion.  Cardiovascular: Normal rate and regular rhythm.  Exam  reveals no gallop and no friction rub.   No murmur heard. Pulmonary/Chest: Effort normal and breath sounds normal. No respiratory distress. She has no wheezes. She has no rales. She exhibits no tenderness.  Abdominal: Soft. Bowel sounds are normal. She exhibits no distension and no mass. There is tenderness (Epigastric and RUQ tenderness). There is no rebound and no guarding. A hernia is present.  Large, well-healed vertical scar from prior laparotomy.  Neurological: She is alert and oriented to person, place, and time.  Skin: Skin is warm and dry.  Psychiatric: She has a normal mood and affect. Her behavior is normal.  Nursing note and vitals reviewed.    ED Treatments / Results  Labs (all labs ordered are listed,  but only abnormal results are displayed) Labs Reviewed  LIPASE, BLOOD - Abnormal; Notable for the following:       Result Value   Lipase 618 (*)    All other components within normal limits  COMPREHENSIVE METABOLIC PANEL - Abnormal; Notable for the following:    CO2 17 (*)    Glucose, Bld 110 (*)    Albumin 2.8 (*)    Alkaline Phosphatase 187 (*)    All other components within normal limits  CBC - Abnormal; Notable for the following:    WBC 11.7 (*)    RBC 3.69 (*)    MCV 102.2 (*)    Platelets 454 (*)    All other components within normal limits  ETHANOL    EKG  EKG Interpretation None       Radiology No results found.  Procedures Procedures (including critical care time)  Medications Ordered in ED Medications  sodium chloride 0.9 % bolus 1,000 mL (0 mLs Intravenous Stopped 06/13/16 1040)  HYDROmorphone (DILAUDID) injection 1 mg (1 mg Intravenous Given 06/13/16 0812)  ketorolac (TORADOL) 30 MG/ML injection 30 mg (30 mg Intravenous Given 06/13/16 0811)  ondansetron (ZOFRAN) injection 4 mg (4 mg Intravenous Given 06/13/16 0810)     Initial Impression / Assessment and Plan / ED Course  I have reviewed the triage vital signs and the nursing  notes.  Pertinent labs & imaging results that were available during my care of the patient were reviewed by me and considered in my medical decision making (see chart for details).  56 year old female with acute on chronic pancreatitis. She is hypertensive but otherwise vitals are normal. CBC remarkable for leukocytosis of 11.7. MCV is 102.2 likely from etoh abuse. CMP remarkable for CO2 of 17, AP 187, and lipase of 618 which is up from 136 three days ago. On initial exam patient is bent over and distressed due to pain. Will start IVF, Zofran, Pain medicine and reassess.  8:39 AM Patient is much more comfortable. Pain is 2/10. Abdomen is soft, benign but tender in epigastric area and RUQ.   10AM Pt still feels better. Discussed going home and patient is agreeable. Discussed continuing etoh cessation and avoidance of Tylenol. Rx given for Oxycodone and advised follow up with [facility]. Return precautions discussed.   Final Clinical Impressions(s) / ED Diagnoses   Final diagnoses:  Acute pancreatitis, unspecified complication status, unspecified pancreatitis type    New Prescriptions New Prescriptions   No medications on file     Bethel Born, PA-C 06/13/16 1614    Raeford Razor, MD 06/27/16 1301

## 2016-06-13 NOTE — ED Triage Notes (Signed)
Pt reports abdominal pain and back pain "since forever" that got worse again about 1 week ago. Pt reports N/V yesterday but just dry heaving today. She denies diarrhea. PT reports being seen here Saturday and receiving prescriptions that provide no relief

## 2016-06-13 NOTE — Telephone Encounter (Signed)
Sharon JacobsonHelen, I am not exactly sure what the tramadol is treating, is she in pain from pancreatitis, I have not yet personally evaluated her and I would hate to treat something with pain medications that is a sign a manifestation of a woresening problem.  [facility]

## 2016-06-13 NOTE — ED Notes (Addendum)
Pt unable to void at this time. Pt given specimen cup 

## 2016-06-13 NOTE — Telephone Encounter (Signed)
She states it is pancreatitis pain that is sporadic, she came in to triage today and did not seem to be in pain at the time and stated as so, I ask so you get 9 and she said yes but maybe a few more would be good? I will call her and see if she is open to an appt

## 2016-06-13 NOTE — ED Notes (Signed)
Cancelled resp appt. Call to reschedule when patient is discharged. [phone].

## 2016-06-13 NOTE — Discharge Instructions (Signed)
Rest and drink clear fluids until you can tolerate foods Take 400mg  Ibuprofen with Oxycodone to provide extra pain relief Continue Zofran or Phenergan [facility] Return for worsening symptoms

## 2016-06-14 NOTE — Telephone Encounter (Signed)
[facility]

## 2016-06-14 NOTE — Telephone Encounter (Signed)
I received a notice that she was seen in the ED yesterday for pancreatitis and was discharged with oxycodone.  It would be good to have her seen in the clinic for follow up of this ED visit and recurrent pancreatitis.

## 2016-06-15 ENCOUNTER — Encounter (HOSPITAL_COMMUNITY): Payer: Self-pay | Admitting: General Practice

## 2016-06-15 ENCOUNTER — Ambulatory Visit (HOSPITAL_COMMUNITY): Payer: Managed Care, Other (non HMO)

## 2016-06-15 ENCOUNTER — Inpatient Hospital Stay (HOSPITAL_COMMUNITY)
Admission: AD | Admit: 2016-06-15 | Discharge: 2016-06-20 | DRG: 440 | Disposition: A | Discharge status: Home / Self Care | Payer: Managed Care, Other (non HMO) | Source: Ambulatory Visit | Attending: Internal Medicine | Admitting: Internal Medicine

## 2016-06-15 ENCOUNTER — Ambulatory Visit (INDEPENDENT_AMBULATORY_CARE_PROVIDER_SITE_OTHER): Payer: Managed Care, Other (non HMO) | Admitting: Internal Medicine

## 2016-06-15 VITALS — BP 154/102 | HR 83 | Temp 97.9°F | Ht 66.0 in | Wt 166.5 lb

## 2016-06-15 DIAGNOSIS — D649 Anemia, unspecified: Secondary | ICD-10-CM | POA: Diagnosis present

## 2016-06-15 DIAGNOSIS — M25422 Effusion, left elbow: Secondary | ICD-10-CM | POA: Diagnosis not present

## 2016-06-15 DIAGNOSIS — Z885 Allergy status to narcotic agent status: Secondary | ICD-10-CM

## 2016-06-15 DIAGNOSIS — J45909 Unspecified asthma, uncomplicated: Secondary | ICD-10-CM | POA: Diagnosis present

## 2016-06-15 DIAGNOSIS — K859 Acute pancreatitis without necrosis or infection, unspecified: Principal | ICD-10-CM

## 2016-06-15 DIAGNOSIS — M7989 Other specified soft tissue disorders: Secondary | ICD-10-CM | POA: Diagnosis not present

## 2016-06-15 DIAGNOSIS — K219 Gastro-esophageal reflux disease without esophagitis: Secondary | ICD-10-CM | POA: Diagnosis present

## 2016-06-15 DIAGNOSIS — I10 Essential (primary) hypertension: Secondary | ICD-10-CM | POA: Diagnosis present

## 2016-06-15 DIAGNOSIS — Z79899 Other long term (current) drug therapy: Secondary | ICD-10-CM

## 2016-06-15 DIAGNOSIS — M1120 Other chondrocalcinosis, unspecified site: Secondary | ICD-10-CM | POA: Diagnosis not present

## 2016-06-15 DIAGNOSIS — E860 Dehydration: Secondary | ICD-10-CM | POA: Diagnosis present

## 2016-06-15 DIAGNOSIS — K861 Other chronic pancreatitis: Secondary | ICD-10-CM | POA: Diagnosis present

## 2016-06-15 DIAGNOSIS — K8689 Other specified diseases of pancreas: Secondary | ICD-10-CM | POA: Diagnosis present

## 2016-06-15 DIAGNOSIS — K7460 Unspecified cirrhosis of liver: Secondary | ICD-10-CM | POA: Diagnosis present

## 2016-06-15 DIAGNOSIS — F17210 Nicotine dependence, cigarettes, uncomplicated: Secondary | ICD-10-CM

## 2016-06-15 DIAGNOSIS — K860 Alcohol-induced chronic pancreatitis: Secondary | ICD-10-CM | POA: Diagnosis present

## 2016-06-15 DIAGNOSIS — I700 Atherosclerosis of aorta: Secondary | ICD-10-CM | POA: Diagnosis present

## 2016-06-15 DIAGNOSIS — Z66 Do not resuscitate: Secondary | ICD-10-CM

## 2016-06-15 DIAGNOSIS — Z86711 Personal history of pulmonary embolism: Secondary | ICD-10-CM | POA: Diagnosis not present

## 2016-06-15 DIAGNOSIS — K838 Other specified diseases of biliary tract: Secondary | ICD-10-CM | POA: Diagnosis present

## 2016-06-15 DIAGNOSIS — F1021 Alcohol dependence, in remission: Secondary | ICD-10-CM | POA: Diagnosis not present

## 2016-06-15 DIAGNOSIS — K769 Liver disease, unspecified: Secondary | ICD-10-CM | POA: Diagnosis not present

## 2016-06-15 DIAGNOSIS — R160 Hepatomegaly, not elsewhere classified: Secondary | ICD-10-CM

## 2016-06-15 DIAGNOSIS — M11222 Other chondrocalcinosis, left elbow: Secondary | ICD-10-CM | POA: Diagnosis present

## 2016-06-15 DIAGNOSIS — Z8679 Personal history of other diseases of the circulatory system: Secondary | ICD-10-CM

## 2016-06-15 DIAGNOSIS — M25522 Pain in left elbow: Secondary | ICD-10-CM | POA: Diagnosis not present

## 2016-06-15 DIAGNOSIS — M13122 Monoarthritis, not elsewhere classified, left elbow: Secondary | ICD-10-CM | POA: Diagnosis not present

## 2016-06-15 HISTORY — DX: Personal history of other medical treatment: Z92.89

## 2016-06-15 HISTORY — DX: Pneumonia, unspecified organism: J18.9

## 2016-06-15 HISTORY — DX: Headache, unspecified: R51.9

## 2016-06-15 HISTORY — DX: Headache: R51

## 2016-06-15 HISTORY — DX: Gastro-esophageal reflux disease without esophagitis: K21.9

## 2016-06-15 MED ORDER — ENSURE ENLIVE PO LIQD: 237.0000 mL | Freq: Two times a day (BID) | ORAL | Status: DC

## 2016-06-15 MED ORDER — PANCRELIPASE (LIP-PROT-AMYL) 12000-38000 UNITS PO CPEP
24000.0000 [IU] | ORAL_CAPSULE | Freq: Three times a day (TID) | ORAL | Status: DC
  Administered 2016-06-17 – 2016-06-18 (×6): 24000 [IU] via ORAL
  Filled 2016-06-15 (×8): qty 2

## 2016-06-15 MED ORDER — PROMETHAZINE HCL 25 MG RE SUPP: 25.0000 mg | Freq: Four times a day (QID) | RECTAL | Status: DC | PRN

## 2016-06-15 MED ORDER — SODIUM CHLORIDE 0.9 % IV SOLN
Freq: Once | INTRAVENOUS | Status: AC
  Administered 2016-06-15: 12:00:00 via INTRAVENOUS

## 2016-06-15 MED ORDER — PANTOPRAZOLE SODIUM 40 MG PO TBEC
40.0000 mg | DELAYED_RELEASE_TABLET | Freq: Every day | ORAL | Status: DC
  Administered 2016-06-16 – 2016-06-20 (×6): 40 mg via ORAL
  Filled 2016-06-15 (×5): qty 1

## 2016-06-15 MED ORDER — HYDROMORPHONE HCL 2 MG/ML IJ SOLN
1.0000 mg | INTRAMUSCULAR | Status: DC | PRN
  Administered 2016-06-15 – 2016-06-20 (×23): 1 mg via INTRAVENOUS
  Filled 2016-06-15 (×23): qty 1

## 2016-06-15 MED ORDER — PROMETHAZINE HCL 25 MG PO TABS
25.0000 mg | ORAL_TABLET | Freq: Four times a day (QID) | ORAL | Status: DC | PRN
  Administered 2016-06-16 – 2016-06-17 (×2): 25 mg via ORAL
  Filled 2016-06-15 (×2): qty 1

## 2016-06-15 MED ORDER — PROMETHAZINE HCL 25 MG/ML IJ SOLN
25.0000 mg | Freq: Four times a day (QID) | INTRAMUSCULAR | Status: DC | PRN
  Administered 2016-06-15 – 2016-06-16 (×4): 25 mg via INTRAVENOUS
  Filled 2016-06-15 (×4): qty 1

## 2016-06-15 MED ORDER — ENOXAPARIN SODIUM 40 MG/0.4ML ~~LOC~~ SOLN
40.0000 mg | SUBCUTANEOUS | Status: DC
  Filled 2016-06-15: qty 0.4

## 2016-06-15 MED ORDER — MORPHINE SULFATE (PF) 4 MG/ML IV SOLN
4.0000 mg | INTRAVENOUS | Status: DC | PRN
  Administered 2016-06-15: 4 mg via INTRAVENOUS
  Filled 2016-06-15: qty 1

## 2016-06-15 MED ORDER — SODIUM CHLORIDE 0.9 % IV SOLN
INTRAVENOUS | Status: AC
  Administered 2016-06-15 – 2016-06-16 (×2): via INTRAVENOUS

## 2016-06-15 MED ORDER — ALBUTEROL SULFATE (2.5 MG/3ML) 0.083% IN NEBU
2.5000 mg | INHALATION_SOLUTION | Freq: Four times a day (QID) | RESPIRATORY_TRACT | Status: DC | PRN
Start: 2016-06-15 — End: 2016-06-20

## 2016-06-15 MED ORDER — LORAZEPAM 2 MG/ML IJ SOLN
1.0000 mg | Freq: Once | INTRAMUSCULAR | Status: AC
  Administered 2016-06-16: 1 mg via INTRAVENOUS
  Filled 2016-06-15: qty 1

## 2016-06-15 NOTE — Progress Notes (Signed)
   CC: abd pain  HPI:  [name] is a 56 y.o. with past medical history as outlined below who presents to clinic for abdominal pain.  Patient was seen in the ED 3 days ago for abdominal pain and treated for acute pancreatitis and d/c'd home that day s/p IVF, zofran, and pain meds. Her lipase was 618 at that time. Since d/c pt has not been able to tolerate po intake. She has had nausea and vomiting that was not relieved by phenergan. She last vomiting prior to clinic visit. She denies NS or chills but reports feeling more feverish. Denies recent alcohol use. Her abd pain moves around but all radiates to her back. [facility]   Past Medical History:  Diagnosis Date  . Asthma   . Former smoker   . H/O chronic pancreatitis   . H/O ETOH abuse   . Hx of pulmonary embolus   . Pancreatic abscess     Review of Systems:  Pertinent ROS noted in HPI.   Physical Exam:  Vitals:   06/15/16 1043  BP: (!) 145/98  Pulse: 98  Temp: 98 F (36.7 C)  TempSrc: Oral  SpO2: 100%  Weight: 166 lb 8 oz (75.5 kg)  Height: 5\' 6"  (1.676 m)   Physical Exam  Constitutional: appears older than stated age, hunched forward due to pain  HENT:  Head: Normocephalic and atraumatic. Dry mucous membranes Nose: Nose normal.  Cardiovascular: tachycardic, regular rhythm and normal heart sounds.  Exam reveals no gallop and no friction rub.   No murmur heard. Pulmonary/Chest: Effort normal and breath sounds normal. No respiratory distress.  has no wheezes.no rales.  Abdominal: Soft. No guarding or rebound. LLQ ventral hernia TTP but reducible. Hypoactive bowl sounds. Neurological: alert and oriented to person, place, and time.  Skin: poor skin turgor    Assessment & Plan:   See Encounters Tab for problem based charting.  Patient discussed with Dr. Cyndie ChimeGranfortuna

## 2016-06-15 NOTE — Assessment & Plan Note (Signed)
A: Patient was seen in the ED 3 days ago for abdominal pain and treated for acute pancreatitis and d/c'd home that day s/p IVF, zofran, and pain meds. Her lipase was 618 at that time. Since d/c pt has not been able to tolerate po intake. She has had nausea and vomiting that was not relieved by phenergan. She last vomiting prior to clinic visit. She denies NS or chills but reports feeling more feverish. Denies recent alcohol use. Her abd pain moves around but all radiates to her back. [facility] and [facility]. Orthostatic vitals positive -- laying 144/95 and standing 124/85  P: Admit to IMTS for acute pancreatitis. Started on NS IVF at 200 cc/hr, given IV toradol. Will need repeat CMET, CBC, abd U/S, and KUB on admission.

## 2016-06-15 NOTE — H&P (Signed)
Date: 06/15/2016               Patient Name:  Sharon Kent MRN: [id number]  DOB: 01-07-1961 Age / Sex: 56 y.o., female   PCP: Gust RungErik C Hoffman, DO         Medical Service: Internal Medicine Teaching Service         Attending Physician: Dr. Gust RungErik C Hoffman, DO    First Contact: Dr. Noemi ChapelBethany Brazos Sandoval Pager: [phone]  Second Contact: Dr. [name] Rice Pager: [phone]       After Hours (After 5p/  First Contact Pager: [phone]  weekends / holidays): Second Contact Pager: [phone]   Chief Complaint: Abdominal pain, nausea, vomiting  History of Present Illness: Ms. Sharon MealingVictoria Dimauro is a very pleasant 56 year old female with medical history significant for history of alcohol abuse, chronic alcoholic pancreatitis, tobacco abuse, history of asthma and hypertension who presents with a 3-4 week history of progressively worsening abdominal pain, nausea and vomiting. She was admitted 2/17-2/18 with acute on chronic pancreatitis which resolved rapidly with treatment consisting of IVF, Zofran and dilaudid. She was able to tolerate diet in hospital and was discharged home. Unfortunately the patient has been experiencing abdominal pain, nausea and vomiting since discharge. She has been unable to tolerate PO intake for 3 days and reports the last time she was able to tolerate a meal was several weeks ago and has been snacking on crackers since that time. Could not describe exactly where her abdominal pain is however reports it started in her epigastrum and now has diffuse pain with radiation to her back. She has been seen in the ED 3 times since discharge with the above complaints. Was found to have elevated lipase (>600) and was treated with IVF, Zofran and pain medication. Ethanol level was negative at that time.  Endorses chills however denied any fever, diarrhea, chest pain, shortness of breath or hemoptysis. She denies any recent alcohol consumption. Reports she has been on creon for many years.   In the clinic  her vital signs showed temp of 98*, pulse 98, BP 145/98, and she was saturating 100% on RA. She was in exceptional pain with diffuse abdominal tenderness and poor skin turgor. She was subsequently started on IVF and admitted for further management.   Meds:  Current Meds  Medication Sig  . Albuterol Sulfate 108 (90 Base) MCG/ACT AEPB Inhale 2 puffs into the lungs as needed. (Patient taking differently: Inhale 2 puffs into the lungs every 4 (four) hours as needed (for shortness of breath). )  . ibuprofen (ADVIL,MOTRIN) 200 MG tablet Take 400 mg by mouth every 6 (six) hours as needed for mild pain.   Marland Kitchen. lipase/protease/amylase (CREON) 12000 units CPEP capsule Take 24,000 Units by mouth 3 (three) times daily with meals.  . Multiple Vitamins-Minerals (MULTIVITAMIN WITH MINERALS) tablet Take 1 tablet by mouth daily.  . ondansetron (ZOFRAN ODT) 4 MG disintegrating tablet Take 1 tablet (4 mg total) by mouth every 8 (eight) hours as needed for nausea or vomiting.  Marland Kitchen. oxyCODONE (OXY IR/ROXICODONE) 5 MG immediate release tablet Take 1 tablet (5 mg total) by mouth every 6 (six) hours as needed for severe pain.  . pantoprazole (PROTONIX) 40 MG tablet Take 1 tablet (40 mg total) by mouth daily.  . potassium chloride SA (K-DUR,KLOR-CON) 20 MEQ tablet Take 1 tablet (20 mEq total) by mouth 2 (two) times daily. (Patient taking differently: Take 20 mEq by mouth daily. )  . promethazine (PHENERGAN) 25  MG suppository Place 1 suppository (25 mg total) rectally every 6 (six) hours as needed for nausea or vomiting.   Allergies: Allergies as of 06/15/2016 - Review Complete 06/15/2016  Allergen Reaction Noted  . Morphine Nausea Only 11/23/2015  . Oxycodone-acetaminophen Other (See Comments) 12/26/2012   Past Medical History:  Diagnosis Date  . Asthma   . Former smoker   . H/O chronic pancreatitis   . H/O ETOH abuse   . Hx of pulmonary embolus   . Pancreatic abscess    Family History: PATIENT ADOPTED. Family  history unknown.  Social History: Smokes 1/2 pack per day for 31 years. Denied any alcohol use to admitting provider however informed RN she drinks a couple shots of liquor a month. Denied any drug use. Lives at home with her dog Optician, dispensing mix). Works as a Engineer, production.   Review of Systems: A complete ROS was negative except as per HPI.  Physical Exam: Blood pressure (!) 161/102, pulse 80, temperature 98.5 F (36.9 C), temperature source Oral, resp. rate 20, height 5\' 6"  (1.676 m), weight 166 lb 8 oz (75.5 kg), SpO2 95 %. General: Chronically-ill appearing caucasian female. Resting in bed, bundled in blankets. Appears uncomfortable.  HENT: EOMI. No conjunctival injection, icterus or ptosis. Oropharynx clear, mucous membranes dry.  Cardiovascular: Regular rate and rhythm. No murmur or rub appreciated. Pulmonary: CTA BL however diminished. Unlabored breathing.  Abdomen: Soft, no guarding or rigidity. Diffusely tender throughout, worse in epigastrium. LLQ ventral hernia appreciated however reproducible. Tender to palpation. Hypoactive bowel sounds. Extremities: No peripheral edema noted BL. No gross deformities. Skin: Warm, dry. No cyanosis. Poor skin turgor.  Neuro: Strength and sensation grossly intact.  Psych: Mood normal and affect was mood congruent. Responds to questions appropriately.   MR abdomen/pelvis pending  Assessment & Plan by Problem: Principal Problem:   Chronic pancreatitis (HCC) Active Problems:   Asthma   History of hypertension   Acute on chronic pancreatitis (HCC)   Acute on Chronic Alcoholic Pancreatitis: Several week history of worsening abdominal pain, nausea and vomiting rendering patient unable to tolerate solid nor liquids. Was recently admitted for pancreatitis which resolved quickly and she was tolerating a diet prior to discharge. She reports she has been having significant abdominal pain since discharge and has been seen 3 times in ED since discharge for this  complaint. Last visit was 3 days ago at which time her lipase was elevated at >600. She was treated with IVF, zofran and pain control and d/c home with instructions to follow up with PCP. She denies any alcohol abuse and triglycerides 96 based on recent lipid panel. She reports compliance with Creon. Unclear what is driving these frequent exacerbations. I am suspicious for possible development of pancreatic pseudocyst. She does have history of pancreatic abscess. Currently she is HD stable however quite dehydrated on exam.  -NPO except sips of water and ICE. Explained importance of bowel rest -IVF  -Zofran for nausea -Morphine for pain -CBC and CMET pending -MR abdomen w/wo contrast to evaluate as explained below -Creon 24,000 units TID  Hepatic Lesion, Elevated AFP: As demonstrated on CT abdomen/pelvis 05/27/2016 which showed a cirrhotic liver with an area of low attenuation within the right lobe, measuring at least 3.4 cm. Mass could not be excluded and MR abdomen w/wo contrast was scheduled for next week for follow-up. Have ordered MR abdomen to evaluate BOTH hepatic mass and the pancreas for possible pseudocyst.  -AFP 11.7 05/28/2016 -MR abdomen w/wo contrast to evaluate both liver  mass and pancreas  History of Asthma: Lungs clear on examination and no complaints of wheezing or dyspnea. Albuterol PRN.  Code Status: DNR - This was discussed with the patient upon admission. IVF: s/p 1L bolus. NS 270mL/hr x 13 hours. Diet: NPO except water, ice chips DVT Prophylaxis: Lovenox  Dispo: Admit patient to Inpatient with expected length of stay greater than 2 midnights.  SignedNoemi Chapel, DO 06/15/2016, 3:20 PM  Pager: [phone]

## 2016-06-15 NOTE — Progress Notes (Signed)
Two RNs looked to attempt IV start but unsuccessful. Notified IV team & will be here shortly.

## 2016-06-15 NOTE — Progress Notes (Signed)
Spoke with provider Molt regarding pt's elevated by of 161/102 per provider no meds are needed at this time. Will continue to monitor pt.

## 2016-06-15 NOTE — Progress Notes (Signed)
Medicine attending: Medical history, presenting problems, physical findings, and medications, reviewed with resident physician Dr Deveron Furlongian Troung on the day of the patient visit and I concur with her evaluation and management plan. I personally examined this patient today.  She has a history of chronic alcoholic pancreatitis.  She has had no alcohol consumption for a number of weeks.  She presented to the emergency department about 3 days ago with recurrent, severe, acute abdominal pain and significant elevation of serum lipase.  Condition was stabilized with IV fluids and pain medications in the emergency department and she requested discharge.  She returns today with persistent, progressive, abdominal pain now associated with vomiting and inability to keep fluids or solid food down. On exam: Pertinent findings limited to the abdomen where there is a large midline abdominal incision.  There is a left paramedian ventral hernia about midway down the incision line.  No obvious incarcerated bowel loops.  Abdomen is diffusely tender but more tender in the epigastric, right upper quadrant, and left upper quadrants.  Bowel sounds are decreased.  No obvious mass or organomegaly.  Impression: 1.  Acute on chronic pancreatitis  2.  Dehydration secondary to #1.  3.  History of alcohol abuse  We will admit the patient to the hospital today for further care.

## 2016-06-15 NOTE — Progress Notes (Signed)
Pt has morphine for pain, stated that morphine is not as effective as dilaudid. MD paged.

## 2016-06-16 ENCOUNTER — Inpatient Hospital Stay (HOSPITAL_COMMUNITY): Payer: Managed Care, Other (non HMO)

## 2016-06-16 DIAGNOSIS — K8689 Other specified diseases of pancreas: Secondary | ICD-10-CM

## 2016-06-16 LAB — CBC
HEMATOCRIT: 30.1 % — AB (ref 36.0–46.0)
Hemoglobin: 9.9 g/dL — ABNORMAL LOW (ref 12.0–15.0)
MCH: 33.1 pg (ref 26.0–34.0)
MCHC: 32.9 g/dL (ref 30.0–36.0)
MCV: 100.7 fL — AB (ref 78.0–100.0)
PLATELETS: 378 10*3/uL (ref 150–400)
RBC: 2.99 MIL/uL — ABNORMAL LOW (ref 3.87–5.11)
RDW: 15.3 % (ref 11.5–15.5)
WBC: 9.7 10*3/uL (ref 4.0–10.5)

## 2016-06-16 LAB — COMPREHENSIVE METABOLIC PANEL
ALBUMIN: 2.1 g/dL — AB (ref 3.5–5.0)
ALT: 13 U/L — AB (ref 14–54)
AST: 29 U/L (ref 15–41)
Alkaline Phosphatase: 129 U/L — ABNORMAL HIGH (ref 38–126)
Anion gap: 9 (ref 5–15)
BILIRUBIN TOTAL: 0.9 mg/dL (ref 0.3–1.2)
BUN: 6 mg/dL (ref 6–20)
CHLORIDE: 111 mmol/L (ref 101–111)
CO2: 17 mmol/L — ABNORMAL LOW (ref 22–32)
CREATININE: 0.72 mg/dL (ref 0.44–1.00)
Calcium: 8 mg/dL — ABNORMAL LOW (ref 8.9–10.3)
GFR calc Af Amer: >60 mL/min (ref >60)
GLUCOSE: 85 mg/dL (ref 65–99)
POTASSIUM: 3 mmol/L — AB (ref 3.5–5.1)
Sodium: 137 mmol/L (ref 135–145)
TOTAL PROTEIN: 5.7 g/dL — AB (ref 6.5–8.1)

## 2016-06-16 MED ORDER — GADOBENATE DIMEGLUMINE 529 MG/ML IV SOLN
15.0000 mL | Freq: Once | INTRAVENOUS | Status: AC | PRN
  Administered 2016-06-16: 15 mL via INTRAVENOUS

## 2016-06-16 NOTE — Progress Notes (Signed)
Initial Nutrition Assessment  DOCUMENTATION CODES:   Not applicable  INTERVENTION:   -RD will follow for diet advancement and supplement as appropriate  NUTRITION DIAGNOSIS:   Inadequate oral intake related to altered GI function as evidenced by NPO status.  GOAL:   Patient will meet greater than or equal to 90% of their needs  MONITOR:   Diet advancement, Labs, Weight trends, Skin, I & O's  REASON FOR ASSESSMENT:   Malnutrition Screening Tool    ASSESSMENT:   [name] is a very pleasant 56 year old female with medical history significant for history of alcohol abuse, chronic alcoholic pancreatitis, tobacco abuse, history of asthma and hypertension who presents with a 3-4 week history of progressively worsening abdominal pain, nausea and vomiting  Pt admitted with pancreatitis.   Spoke with pt at bedside, who reports a general decline in health over the past 2-3 months. Pt shares that she suffered pneumonia during the beginning of the year. After recovering from pneumonia, pt has been experiencing poor appetite over the past month, due to difficulty keeping foods down due to lack of appetite. 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   Pt shares ongoing wt loss over the past 2-3 months. Per wt hx, pt has experienced a 7.2% wt loss over the past 2 months.   Pt reports she has had pancreatitis before and shared that she required short term nutrition support about 6-7 years [facility] and got down to about 120#. Pt currently tolerating ice chips without difficulty and feels ready to consume water.   Nutrition-Focused physical exam completed. Findings are mild fat depletion, mild muscle depletion, and no edema. Noted fat and muscle depletion in upper and lower extremities only and may be partially related to dehydration. RD unable to identify malnutrition at this time, however, pt remains at high risk  given ongoing wt loss, poor po intake, and pancreatitis.   Discussed importance of good PO intake for healing and potential for diet advancement. Pt amenable to supplements- RD to order based upon preference and diet order.   Medications reviewed and include Creon.  Labs reviewed: K: 3.0, lipase: 618.   Diet Order:  Diet NPO time specified Except for: Sips with Meds, Ice Chips  Skin:  Reviewed, no issues  Last BM:  06/13/16  Height:   Ht Readings from Last 1 Encounters:  06/15/16 5\' 6"  (1.676 m)    Weight:   Wt Readings from Last 1 Encounters:  06/16/16 167 lb 3.2 oz (75.8 kg)    Ideal Body Weight:  59.1 kg  BMI:  Body mass index is 26.99 kg/m.  Estimated Nutritional Needs:   Kcal:  1900-2100  Protein:  100-115 grams  Fluid:  1.9-2.1 L  EDUCATION NEEDS:   Education needs addressed  Tashaya Ancrum A. Mayford KnifeWilliams, RD, LDN, CDE Pager: [phone] After hours Pager: [phone]

## 2016-06-16 NOTE — Progress Notes (Signed)
Subjective: Sharon Kent was seen and evaluated today at bedside. Reports nausea overnight. Abdominal pain and distention still present however improved. Not hungry, does not feel ready to eat. Pain controlled with Dilaudid.   Objective:  Vital signs in last 24 hours: Vitals:   06/15/16 1516 06/16/16 0536 06/16/16 0621  BP: (!) 161/102  138/85  Pulse: 80  97  Resp: 20  20  Temp: 98.5 F (36.9 C)  97.9 F (36.6 C)  TempSrc: Oral    SpO2: 95%  91%  Weight: 166 lb 8 oz (75.5 kg) 167 lb 3.2 oz (75.8 kg)   Height: 5' 6" (1.676 m)     General: Chronically-ill appearing caucasian woman, in no acute distress. Resting comfortably in bed. Appears more comfortable than yesterday.  HENT:EOMI. No conjunctival injection, icterus or ptosis.  Cardiovascular: Regular rate and rhythm. No murmur or rub appreciated. Pulmonary: CTA BL however diminished. Unlabored breathing.  Abdomen: Soft, tender and mildly distended. No guarding or rigidity. +bowel sounds.  Extremities: No peripheral edema noted BL. No gross deformities. Psych: Mood normal and affect was mood congruent. Responds to questions appropriately.   MR abdomen 06/16/16: Severely motion degraded. Progressive irregular pancreatic ductal dilation (14 mm) with central filling defect. 5 x 4 x 2.8 cm loculated fluid collection adjacent to left mid abdominal wall. Dilated common bile duct (12 mm). Peripancreatic inflammatory changes consistent with acute on chronic pancreatitis. ERCP recommended when pt clinically improved.  CT abdomen/pelvis 05/27/16: Acute on chronic calcific pancreatitis. Fluid collection adjacent to spleen, could not exclude developing pseudocyst measuring 5.1 x 2.4 x 4.1 cm.   Assessment/Plan:This is a 56 year old female with Mhx significant for chronic alcoholic pancreatitis with exacerbation x 1 month. CT and MR show progressive pancreatic duct dilation.  Principal Problem:   Chronic pancreatitis ([place]) Active Problems:  Asthma   History of hypertension   Acute on chronic pancreatitis (HCC)   Dilation of pancreatic duct  Acute on Chronic Pancreatitis, Dilation of Pancreatic Duct, Dilated CBD, ?Pseudocyst: As demonstrated on MRI abdomen. Unfortunately the MRI was motion degraded however did show progressive irregular dilation of the pancreatic duct with a central filling defect. Also showed dilation of CBD. There was peripancreatic inflammation without discrete pseudocyst however there was a loculated fluid collection near left mid-abdominal wall. ERCP was recommended for follow up. At this point, I suspect there may be an additional trigger for her recurrent and consistent pancreatitis symptoms given her lack of recent alcohol use. Honestly, I suspect a pancreatic mass.  -Elevated AFP -MRCP?  -Dilaudid 1 mg Q4H prn -Consulted GI for their advice re: constant pancreatitis, ?mass -Phenergan PRN -NPO except water, ice -Chronically elevated alk phos -No leukocytosis today  History of Asthma: Lungs clear. Albuterol PRN   History of Alcohol Abuse, Current Tobacco Abuse: Patient continues to report alcohol abstinence for several weeks. Currently smokes 1/2 pack per day. Encouraged cessation.  Consult: GI Dispo: Anticipated discharge unclear.   Kavitha Lansdale, DO 06/16/2016, 12:13 PM Pager: [phone]

## 2016-06-16 NOTE — Consult Note (Signed)
[facility] CONSULT Reason for consult: acute on chronic pancreatitis Referring Physician: Internal Medicine Teaching Service. Primary G.I.: patient unassigned  Sharon Kent is an 56 y.o. female.  HPI: she has a long history of pancreatitis dating back at least 15 years associated with heavy alcohol use. This began after the untimely death of her son number years ago. She has been hospitalized [facility] and has received most of her care in [place]. 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 She's not had alcohol of any significance for several months. She has been on Creon for approximately 8 years. We do not have records but she does give a very good history of having a pseudocyst develop and she underwent drainage of this [facility] about 8 years ago. She was hospitalized here with pneumonia and then a 2nd time with acute pancreatitis. CT scan during this admission last month? 5 cm developing pseudocyst. There also was what appeared to be hepatic mass. The CT showed cirrhosis of the liver. The pancreatic duct was noted to be dilated or asked to see her to see whether ERCP would be needed. The patient's lipase was 600 on admission this time. She reports that her pain is feeling somewhat better.  Past Medical History:  Diagnosis Date  . Asthma   . Family history of adverse reaction to anesthesia    "don't think my son ever had anesthesia"  . GERD (gastroesophageal reflux disease)   . H/O chronic pancreatitis   . H/O ETOH abuse   . Headache    "monthly" (06/15/2016)  . History of blood transfusion 2000s   "when they explored my abdomen"  . Hx of pulmonary embolus   . Pancreatic abscess   . Pneumonia    "twice at least" (06/15/2016)    Past Surgical History:  Procedure Laterality Date  . EXPLORATORY LAPAROTOMY  2000s   "took out all my organs and washed the dead tissue"  . IVC FILTER  PLACEMENT ([facility] HX)  12/2010?  Marland Kitchen LAPAROSCOPIC CHOLECYSTECTOMY  2000s    Family History  Problem Relation Age of Onset  . Adopted: Yes  . Family history unknown: Yes    Social History:  reports that she has been smoking Cigarettes.  She has a 15.50 pack-year smoking history. She has never used smokeless tobacco. She reports that she drinks alcohol. She reports that she does not use drugs.  Allergies:  Allergies  Allergen Reactions  . Morphine Nausea Only  . Oxycodone-Acetaminophen Other (See Comments)    Makes her "loopy."    Medications; Prior to Admission medications   Medication Sig Start Date End Date Taking? Authorizing Provider  Albuterol Sulfate 108 (90 Base) MCG/ACT AEPB Inhale 2 puffs into the lungs as needed. Patient taking differently: Inhale 2 puffs into the lungs every 4 (four) hours as needed (for shortness of breath).  06/05/16  Yes Alexa Angela Burke, MD  ibuprofen (ADVIL,MOTRIN) 200 MG tablet Take 400 mg by mouth every 6 (six) hours as needed for mild pain.    Yes Historical Provider, MD  lipase/protease/amylase (CREON) 12000 units CPEP capsule Take 24,000 Units by mouth 3 (three) times daily with meals. 11/27/15  Yes Historical Provider, MD  Multiple Vitamins-Minerals (MULTIVITAMIN WITH MINERALS) tablet Take 1 tablet by mouth daily.   Yes Historical Provider, MD  ondansetron (ZOFRAN ODT) 4 MG disintegrating tablet Take 1 tablet (4 mg total) by mouth  every 8 (eight) hours as needed for nausea or vomiting. 05/30/16  Yes Courteney Lyn Mackuen, MD  oxyCODONE (OXY IR/ROXICODONE) 5 MG immediate release tablet Take 1 tablet (5 mg total) by mouth every 6 (six) hours as needed for severe pain. 06/13/16  Yes Recardo Evangelist, PA-C  pantoprazole (PROTONIX) 40 MG tablet Take 1 tablet (40 mg total) by mouth daily. 05/15/16  Yes Alexa Angela Burke, MD  potassium chloride SA (K-DUR,KLOR-CON) 20 MEQ tablet Take 1 tablet (20 mEq total) by mouth 2 (two) times daily. Patient taking differently: Take 20  mEq by mouth daily.  04/27/16  Yes Ledell Noss, MD  promethazine (PHENERGAN) 25 MG suppository Place 1 suppository (25 mg total) rectally every 6 (six) hours as needed for nausea or vomiting. 06/10/16  Yes Noemi Chapel, MD  traMADol (ULTRAM) 50 MG tablet Take 1 tablet (50 mg total) by mouth every 8 (eight) hours as needed. Patient taking differently: Take 50 mg by mouth every 8 (eight) hours as needed for moderate pain.  05/28/16   Lorella Nimrod, MD   . enoxaparin (LOVENOX) injection  40 mg Subcutaneous Q24H  . lipase/protease/amylase  24,000 Units Oral TID WC  . pantoprazole  40 mg Oral Daily   PRN Meds albuterol, HYDROmorphone (DILAUDID) injection, promethazine **OR** promethazine **OR** promethazine Results for orders placed or performed during the hospital encounter of 06/15/16 (from the past 48 hour(s))  Comprehensive metabolic panel     Status: Abnormal   Collection Time: 06/16/16  9:00 AM  Result Value Ref Range   Sodium 137 135 - 145 mmol/Kent   Potassium 3.0 (Kent) 3.5 - 5.1 mmol/Kent   Chloride 111 101 - 111 mmol/Kent   CO2 17 (Kent) 22 - 32 mmol/Kent   Glucose, Bld 85 65 - 99 mg/dL   BUN 6 6 - 20 mg/dL   Creatinine, Ser 0.72 0.44 - 1.00 mg/dL   Calcium 8.0 (Kent) 8.9 - 10.3 mg/dL   Total Protein 5.7 (Kent) 6.5 - 8.1 g/dL   Albumin 2.1 (Kent) 3.5 - 5.0 g/dL   AST 29 15 - 41 U/Kent   ALT 13 (Kent) 14 - 54 U/Kent   Alkaline Phosphatase 129 (H) 38 - 126 U/Kent   Total Bilirubin 0.9 0.3 - 1.2 mg/dL   GFR calc non Af Amer >60 >60 mL/min   GFR calc Af Amer >60 >60 mL/min    Comment: (NOTE) The eGFR has been calculated using the CKD EPI equation. This calculation has not been validated in all clinical situations. eGFR's persistently <60 mL/min signify possible Chronic Kidney Disease.    Anion gap 9 5 - 15  CBC     Status: Abnormal   Collection Time: 06/16/16  9:00 AM  Result Value Ref Range   WBC 9.7 4.0 - 10.5 K/uL   RBC 2.99 (Kent) 3.87 - 5.11 MIL/uL   Hemoglobin 9.9 (Kent) 12.0 - 15.0 g/dL   HCT 30.1 (Kent) 36.0 - 46.0 %    MCV 100.7 (H) 78.0 - 100.0 fL   MCH 33.1 26.0 - 34.0 pg   MCHC 32.9 30.0 - 36.0 g/dL   RDW 15.3 11.5 - 15.5 %   Platelets 378 150 - 400 K/uL    Mr Abdomen W Wo Contrast  Result Date: 06/16/2016 CLINICAL DATA:  Acute on chronic pancreatitis, abdominal pain, nausea, vomiting EXAM: MRI ABDOMEN WITHOUT AND WITH CONTRAST TECHNIQUE: Multiplanar multisequence MR imaging of the abdomen was performed both before and after the administration of intravenous contrast. CONTRAST:  10m MULTIHANCE  GADOBENATE DIMEGLUMINE 529 MG/ML IV SOLN COMPARISON:  CT abdomen/pelvis dated 06/24/2016 FINDINGS: Motion degraded images. Lower chest: Trace left pleural effusion. Mild patchy bilateral opacities, likely atelectasis. Hepatobiliary: Moderate hepatic steatosis. No definite enhancing liver lesion is seen, noting postcontrast imaging is markedly degraded by motion. Status post cholecystectomy. No intrahepatic ductal dilatation. Dilated common duct, measuring 12 mm, although smoothly tapering at the ampulla. Pancreas: Irregular dilatation of the pancreatic duct measuring up to 14 mm in the pancreatic body/ tail (series 5/ image 33), mildly progressed. Filling defects centrally within the pancreatic duct (series 6/ image 36), possibly reflecting small ductal calculi. Surrounding inflammatory changes related to acute pancreatitis with fluid. No adjacent pseudocyst, although a 5.4 x 2.8 cm loculated fluid collection is present adjacent to the left mid abdominal wall (series 6/ image 46). Spleen:  Grossly unremarkable. Adrenals/Urinary Tract: Mild thickening/nodularity of the right adrenal gland. Left adrenal gland is within normal limits. Small right renal cysts. Left kidney is within normal limits. No hydronephrosis. Stomach/Bowel: Stomach is notable for a small hiatal hernia. Visualized bowel is poorly evaluated. 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 No suspicious  abdominal lymphadenopathy. Other:  Small volume upper abdominal ascites. Musculoskeletal: No focal osseous lesions. IMPRESSION: Evaluation is severely constrained by motion degradation. Consider repeat pancreatic protocol CT in 3-4 weeks for a diagnostic clinical evaluation of the pancreas and liver. Progressive dilatation of the pancreatic duct, now measuring 14 mm. Suspected filling defect centrally within the pancreatic duct may reflect calculi. Associated peripancreatic inflammatory changes compatible with acute on chronic pancreatitis. Consider ERCP when patient is clinically improved. No adjacent discrete fluid collection/pseudocyst. 5.4 x 2.8 cm loculated fluid collection adjacent to the left mid abdominal wall, poorly evaluated on the current study. Moderate hepatic steatosis. Suspected right liver lesion on prior CT is not evident on the current motion degraded MR, but can be further assessed at the time of follow-up imaging. Additional ancillary findings as above. Electronically Signed   By: Julian Hy M.D.   On: 06/16/2016 08:00               Blood pressure 139/84, pulse 90, temperature 97.9 F (36.6 C), temperature source Oral, resp. rate 19, height '5\' 6"'  (1.676 m), weight 75.8 kg (167 lb 3.2 oz), SpO2 95 %.  Physical exam:   General-- pleasant white female no acute distress ENT-- nonicteric Neck-- supple with no lymphadenopathy Heart-- regular rate and rhythm without murmurs gallops Lungs-- clear Abdomen-- large midline ventral hernia somewhat tender approximately 4 cm in size. Mild epigastric tenderness. Bowel sounds are present. Psych-- alert and oriented gives a good history.   Assessment: 1. Acute on chronic pancreatitis. CT scan during last admission question the development of pseudocyst. She has a history of having had pseudocyst drained many years ago at [facility]. I think we need to follow the development of pseudocyst. 2. Cirrhosis of the liver by CT criteria 3.  New Hepatic Lesion in RT lobe. AFP up slightly, this will need to follow.   Plan: 1. Continue to treat with NPO status for now. 2.  MRI shows a regular pancreatic duct with possible calculi. Patient may need ERCP with removal of calculi, not sure that this could be done here. There is a loculated fluid collection may need to be drained. This will need to be reviewed with radiology.   Sharon Kent,Sharon Kent 06/16/2016, 4:41 PM   This note was created using voice recognition software and minor errors may Have occurred unintentionally. Pager:  [phone] If no answer or after hours call [phone]

## 2016-06-17 DIAGNOSIS — K838 Other specified diseases of biliary tract: Secondary | ICD-10-CM

## 2016-06-17 MED ORDER — DEXTROSE IN LACTATED RINGERS 5 % IV SOLN
INTRAVENOUS | Status: AC
  Administered 2016-06-17: 75 mL/h via INTRAVENOUS
  Administered 2016-06-18: 06:00:00 via INTRAVENOUS

## 2016-06-17 MED ORDER — ONDANSETRON 4 MG PO TBDP
8.0000 mg | ORAL_TABLET | Freq: Three times a day (TID) | ORAL | Status: DC | PRN
  Administered 2016-06-19 (×2): 8 mg via ORAL
  Filled 2016-06-17 (×2): qty 2

## 2016-06-17 MED ORDER — SODIUM CHLORIDE 0.9 % IV SOLN
30.0000 meq | Freq: Once | INTRAVENOUS | Status: AC
  Administered 2016-06-17: 30 meq via INTRAVENOUS
  Filled 2016-06-17: qty 15

## 2016-06-17 MED ORDER — SODIUM CHLORIDE 0.9 % IV SOLN
8.0000 mg | Freq: Three times a day (TID) | INTRAVENOUS | Status: DC | PRN
  Administered 2016-06-17 – 2016-06-18 (×2): 8 mg via INTRAVENOUS
  Filled 2016-06-17 (×5): qty 4

## 2016-06-17 NOTE — Progress Notes (Signed)
[facility] Progress Note  Subjective: Complaining of pain of 9 on a scale of 1-10 but looks fairly comfortable. Hungry but very concerned about her pain and nausea medicines and their schedule an doses. No vomiting.  Objective: Vital signs in last 24 hours: Temp:  [97.9 F (36.6 C)-98.4 F (36.9 C)] 98.2 F (36.8 C) (03/10 0517) Pulse Rate:  [85-95] 85 (03/10 0517) Resp:  [18-19] 18 (03/09 2226) BP: (121-139)/(75-84) 121/75 (03/10 0517) SpO2:  [95 %-99 %] 95 % (03/10 0517) Weight change:    PE: Abdomen diffuse upper abdominal tenderness no mass or rebound  Lab Results: Results for orders placed or performed during the hospital encounter of 06/15/16 (from the past 24 hour(s))  Comprehensive metabolic panel     Status: Abnormal   Collection Time: 06/16/16  9:00 AM  Result Value Ref Range   Sodium 137 135 - 145 mmol/L   Potassium 3.0 (L) 3.5 - 5.1 mmol/L   Chloride 111 101 - 111 mmol/L   CO2 17 (L) 22 - 32 mmol/L   Glucose, Bld 85 65 - 99 mg/dL   BUN 6 6 - 20 mg/dL   Creatinine, Ser 0.10 0.44 - 1.00 mg/dL   Calcium 8.0 (L) 8.9 - 10.3 mg/dL   Total Protein 5.7 (L) 6.5 - 8.1 g/dL   Albumin 2.1 (L) 3.5 - 5.0 g/dL   AST 29 15 - 41 U/L   ALT 13 (L) 14 - 54 U/L   Alkaline Phosphatase 129 (H) 38 - 126 U/L   Total Bilirubin 0.9 0.3 - 1.2 mg/dL   GFR calc non Af Amer >60 >60 mL/min   GFR calc Af Amer >60 >60 mL/min   Anion gap 9 5 - 15  CBC     Status: Abnormal   Collection Time: 06/16/16  9:00 AM  Result Value Ref Range   WBC 9.7 4.0 - 10.5 K/uL   RBC 2.99 (L) 3.87 - 5.11 MIL/uL   Hemoglobin 9.9 (L) 12.0 - 15.0 g/dL   HCT 27.2 (L) 53.6 - 64.4 %   MCV 100.7 (H) 78.0 - 100.0 fL   MCH 33.1 26.0 - 34.0 pg   MCHC 32.9 30.0 - 36.0 g/dL   RDW 03.4 74.2 - 59.5 %   Platelets 378 150 - 400 K/uL    Studies/Results: Mr Abdomen W Wo Contrast  Result Date: 06/16/2016 CLINICAL DATA:  Acute on chronic pancreatitis, abdominal pain, nausea, vomiting EXAM: MRI ABDOMEN WITHOUT  AND WITH CONTRAST TECHNIQUE: Multiplanar multisequence MR imaging of the abdomen was performed both before and after the administration of intravenous contrast. CONTRAST:  15mL MULTIHANCE GADOBENATE DIMEGLUMINE 529 MG/ML IV SOLN COMPARISON:  CT abdomen/pelvis dated 06/24/2016 FINDINGS: Motion degraded images. Lower chest: Trace left pleural effusion. Mild patchy bilateral opacities, likely atelectasis. Hepatobiliary: Moderate hepatic steatosis. No definite enhancing liver lesion is seen, noting postcontrast imaging is markedly degraded by motion. Status post cholecystectomy. No intrahepatic ductal dilatation. Dilated common duct, measuring 12 mm, although smoothly tapering at the ampulla. Pancreas: Irregular dilatation of the pancreatic duct measuring up to 14 mm in the pancreatic body/ tail (series 5/ image 33), mildly progressed. Filling defects centrally within the pancreatic duct (series 6/ image 36), possibly reflecting small ductal calculi. Surrounding inflammatory changes related to acute pancreatitis with fluid. No adjacent pseudocyst, although a 5.4 x 2.8 cm loculated fluid collection is present adjacent to the left mid abdominal wall (series 6/ image 46). Spleen:  Grossly unremarkable. Adrenals/Urinary Tract: Mild thickening/nodularity of the right adrenal gland.  Left adrenal gland is within normal limits. Small right renal cysts. Left kidney is within normal limits. No hydronephrosis. Stomach/Bowel: Stomach is notable for a small hiatal hernia. Visualized bowel is poorly evaluated. 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 No suspicious abdominal lymphadenopathy. Other:  Small volume upper abdominal ascites. Musculoskeletal: No focal osseous lesions. IMPRESSION: Evaluation is severely constrained by motion degradation. Consider repeat pancreatic protocol CT in 3-4 weeks for a diagnostic clinical evaluation of the pancreas and liver. Progressive  dilatation of the pancreatic duct, now measuring 14 mm. Suspected filling defect centrally within the pancreatic duct may reflect calculi. Associated peripancreatic inflammatory changes compatible with acute on chronic pancreatitis. Consider ERCP when patient is clinically improved. No adjacent discrete fluid collection/pseudocyst. 5.4 x 2.8 cm loculated fluid collection adjacent to the left mid abdominal wall, poorly evaluated on the current study. Moderate hepatic steatosis. Suspected right liver lesion on prior CT is not evident on the current motion degraded MR, but can be further assessed at the time of follow-up imaging. Additional ancillary findings as above. Electronically Signed   By: Charline BillsSriyesh  Krishnan M.D.   On: 06/16/2016 08:00      Assessment: 1. Acute on chronic pancreatitis presumed from prior alcohol use 2. Peripancreatic fluid collection possibly representing a developing pseudocyst 3. Pancreatic duct calcifications with dilatation 4. No hepatic mass on yesterday's MRI  Plan: 1. Continue nothing by mouth at least another day 2. Check lipase tomorrow 3. May benefit from therapeutic pancreatic endoscopy which would need to be done at a tertiary care center. This can be pursued its first of the week. 4. We'll continue to follow.    Larnell Granlund C 06/17/2016, 8:41 AM  Pager [phone] If no answer or after 5 PM call [phone]

## 2016-06-17 NOTE — Progress Notes (Addendum)
   Subjective: [name] continues to have upper abdominal pain but is ambulatory and not in acute distress. Reports this is up to 8/10 but says she has a high pain tolerance. Abdominal MRI yesterday was motion degraded but suggesting pancreatic ductal dilation and loculated fluid collection on left side and GI medicine was consulted for recommendations. Interested in trying jello but currently remains NPO based on recommendations for her pancreatitis.  Objective:  Vital signs in last 24 hours: Vitals:   06/16/16 0621 06/16/16 1430 06/16/16 2226 06/17/16 0517  BP: 138/85 139/84 128/76 121/75  Pulse: 97 90 95 85  Resp: 20 19 18    Temp: 97.9 F (36.6 C) 97.9 F (36.6 C) 98.4 F (36.9 C) 98.2 F (36.8 C)  TempSrc:  Oral Oral Oral  SpO2: 91% 95% 99% 95%  Weight:      Height:       General: Chronically-ill appearing caucasian woman, in no acute distress HENT: Moist oral mucosa. No conjunctival injection, icterus or ptosis.  Cardiovascular: Regular rate and rhythm. No murmur or rub. Pulmonary: CTAB, normal WOB.  Abdomen: Soft and nondistended but tender to touch worst over LUQ Extremities: No peripheral edema noted Psych: Mood normal. Responds to questions appropriately.   MR abdomen 06/16/16: Severely motion degraded. Progressive irregular pancreatic ductal dilation (14 mm) with central filling defect. 5 x 4 x 2.8 cm loculated fluid collection adjacent to left mid abdominal wall. Dilated common bile duct (12 mm). Peripancreatic inflammatory changes consistent with acute on chronic pancreatitis. ERCP recommended when pt clinically improved.  CT abdomen/pelvis 05/27/16: Acute on chronic calcific pancreatitis. Fluid collection adjacent to spleen, could not exclude developing pseudocyst measuring 5.1 x 2.4 x 4.1 cm.   Assessment/Plan:This is a 56 year old female with Mhx significant for chronic alcoholic pancreatitis with exacerbation x 1 month. CT and MR show progressive pancreatic duct  dilation.  Acute on Chronic Pancreatitis with Dilation of Pancreatic Duct, Dilated CBD, loculated fluid collection: Currently remains NPO with antiemetics and pain control. GI recommendations at this time consider ERCP at tertiary medical center for EUS to better evaluate and potentially intervene if there is obstruction of pancreatic duct. She has some decrease in K, CO2, AP, all likely related to continuous IV fluid while NPO, will change to lactated ringers today to avoid increasing acidosis. Reports phenergan not working well making her MORE sick on her stomach so will try ondansetron (at limited dose with QT 490 no complications with phenergan thus far). -Dilaudid 1 mg Q4H prn -Consulted GI for their advice re: constant pancreatitis, recommendations appreciated -Zofran PRN -Continuous IVF w/ LR 2475mL/hr -NPO except water, ice -Am Bmet  History of Asthma: Not in acute exacerbation. Albuterol PRN   History of Alcohol Abuse, Current Tobacco Abuse: Patient continues to report alcohol abstinence for several weeks. Currently smokes 1/2 pack per day. Encouraged cessation.  Hypertension: Not hypertensive at present  Consult: GI Dispo: Anticipated discharge pending resumption of PO diet and/or definitive investigation of pancreatic outlet at this time  Fuller Planhristopher W Rice, MD PGY-II Internal Medicine Resident Pager# [phone] 06/17/2016, 2:22 PM   Internal Medicine Attending:   I discussed the case with the resident and agree with the plan as documented in his note.   Erlinda Honguncan Dannie Woolen, MD IMTP Attending

## 2016-06-18 ENCOUNTER — Inpatient Hospital Stay (HOSPITAL_COMMUNITY): Payer: Managed Care, Other (non HMO)

## 2016-06-18 DIAGNOSIS — M7989 Other specified soft tissue disorders: Secondary | ICD-10-CM

## 2016-06-18 DIAGNOSIS — M25522 Pain in left elbow: Secondary | ICD-10-CM

## 2016-06-18 LAB — LIPASE, BLOOD: LIPASE: 59 U/L — AB (ref 11–51)

## 2016-06-18 LAB — BASIC METABOLIC PANEL
Anion gap: 8 (ref 5–15)
BUN: 5 mg/dL — ABNORMAL LOW (ref 6–20)
CALCIUM: 8.5 mg/dL — AB (ref 8.9–10.3)
CHLORIDE: 108 mmol/L (ref 101–111)
CO2: 21 mmol/L — ABNORMAL LOW (ref 22–32)
Creatinine, Ser: 0.67 mg/dL (ref 0.44–1.00)
GFR calc non Af Amer: >60 mL/min (ref >60)
Glucose, Bld: 113 mg/dL — ABNORMAL HIGH (ref 65–99)
POTASSIUM: 3.1 mmol/L — AB (ref 3.5–5.1)
SODIUM: 137 mmol/L (ref 135–145)

## 2016-06-18 NOTE — Progress Notes (Signed)
I received notice from nursing staff about increased left elbow pain this morning with associated swelling. This was in fact swollen but grossly intact ROM and obtained pain film imaging. This showed remote fracture and deformity without evidence of acute changes. I suspect her swelling in the left elbow and forearm arm is increased due to multiple liters of intravenous fluids given while she has remained NPO for her pancreatitis up until today and sleeping with this in a dependent position. She is already receiving analgesics for abdominal pain that should help for this. I will plan to reassess tomorrow morning since if this is the problem swelling would be expected to improve, otherwise will consider other causes for this increased swelling.   Fuller Planhristopher W Tylicia Sherman, MD PGY-II Internal Medicine Resident Pager# [phone] 06/18/2016, 11:53 AM

## 2016-06-18 NOTE — Progress Notes (Addendum)
   Subjective: [name] is improved today walking around and thinks her nausea is much better unclear if related to medication changes or her disease. She now feels hungry and requests a diet.  Objective:  Vital signs in last 24 hours: Vitals:   06/17/16 0517 06/17/16 1515 06/17/16 2149 06/18/16 0518  BP: 121/75 (!) 155/92 129/78 118/70  Pulse: 85 89 90 85  Resp:  20 18 18   Temp: 98.2 F (36.8 C) 98.3 F (36.8 C) 98.2 F (36.8 C) 98.2 F (36.8 C)  TempSrc: Oral Oral Oral Oral  SpO2: 95% 94% 91% 91%  Weight:      Height:       General: Chronically-ill appearing caucasian woman, in no acute distress HENT: Moist oral mucosa. No conjunctival injection, icterus or ptosis.  Cardiovascular: Regular rate and rhythm. No murmur or rub. Pulmonary: CTAB, normal WOB.  Abdomen: Soft and nondistended, only tender to deep palpation Extremities: No peripheral edema noted Psych: Mood normal.  MR abdomen 06/16/16: Severely motion degraded. Progressive irregular pancreatic ductal dilation (14 mm) with central filling defect. 5 x 4 x 2.8 cm loculated fluid collection adjacent to left mid abdominal wall. Dilated common bile duct (12 mm). Peripancreatic inflammatory changes consistent with acute on chronic pancreatitis. ERCP recommended when pt clinically improved.  CT abdomen/pelvis 05/27/16: Acute on chronic calcific pancreatitis. Fluid collection adjacent to spleen, could not exclude developing pseudocyst measuring 5.1 x 2.4 x 4.1 cm.   Assessment/Plan:This is a 56 year old female with Mhx significant for chronic alcoholic pancreatitis with exacerbation x 1 month. CT and MR show progressive pancreatic duct dilation.  Acute on Chronic Pancreatitis with Dilation of Pancreatic Duct, Dilated CBD, loculated fluid collection: Repeat lipase is now down to 59 from 680 . Current plan today for restarting diet and advancing if tolerated. Per last GI recommendations evaluation of pancreatic outlet is her likely  next step since symptoms are very high risk to recur without further intervention. With decreasing nausea we may also be able to start transitioning to oral pain medication. -Continue creon TID -Dilaudid 1 mg Q4H prn -Consulted GI for their advice re: constant pancreatitis, recommendations appreciated -Zofran PRN -Am Bmet  History of Asthma: Not in acute exacerbation. Currently smoking 1/2 ppd cigarettes. Albuterol PRN   Hypertension: Not hypertensive at present  Consult: GI Dispo: Anticipated discharge pending resumption of PO diet and/or definitive investigation of pancreatic outlet at this time  Fuller Planhristopher W Jamal Pavon, MD PGY-II Internal Medicine Resident Pager# [phone] 06/18/2016, 10:20 AM

## 2016-06-18 NOTE — Progress Notes (Signed)
Pt c/o L arm pain. IV fluids stopped, however upon assessment site does not look infiltrated. Joint of elbow appears swollen and Pt can not move as well as R arm. MD notified and aware. Will await orders and continue to monitor.

## 2016-06-18 NOTE — Progress Notes (Signed)
[facility] Progress Note  Subjective: Feels better today, tolerating full liquid diet, no significant complaints of pain or nausea  Objective: Vital signs in last 24 hours: Temp:  [98.2 F (36.8 C)-98.3 F (36.8 C)] 98.2 F (36.8 C) (03/11 0518) Pulse Rate:  [85-90] 85 (03/11 0518) Resp:  [18-20] 18 (03/11 0518) BP: (118-155)/(70-92) 118/70 (03/11 0518) SpO2:  [91 %-94 %] 91 % (03/11 0518) Weight change:    PE: And less distress, abdomen soft  Lab Results: Results for orders placed or performed during the hospital encounter of 06/15/16 (from the past 24 hour(s))  Lipase, blood     Status: Abnormal   Collection Time: 06/18/16  7:58 AM  Result Value Ref Range   Lipase 59 (H) 11 - 51 U/L    Studies/Results: No results found.    Assessment: 1. Acute on chronic pancreatitis presumably secondary to alcohol, improved from yesterday with dramatic decrease in lipase 2. Persistent peripancreatic fluid collection, poorly characterized by yesterday's MRI 3. Pancreatic duct calcifications with ductal dilatation  Plan: 1. Hold at full liquid diet for now and advance as tolerated 2. Consider referral to tertiary care center for ERCP at some point. 3. Will continue to follow.    Tysheka Fanguy C 06/18/2016, 10:47 AM  Pager [phone] If no answer or after 5 PM call [phone]

## 2016-06-19 DIAGNOSIS — M25422 Effusion, left elbow: Secondary | ICD-10-CM

## 2016-06-19 DIAGNOSIS — M13122 Monoarthritis, not elsewhere classified, left elbow: Secondary | ICD-10-CM

## 2016-06-19 LAB — SYNOVIAL CELL COUNT + DIFF, W/ CRYSTALS
Lymphocytes-Synovial Fld: 6 % (ref 0–20)
Monocyte-Macrophage-Synovial Fluid: 3 % — ABNORMAL LOW (ref 50–90)
Neutrophil, Synovial: 91 % — ABNORMAL HIGH (ref 0–25)
WBC, SYNOVIAL: 28800 /mm3 — AB (ref 0–200)

## 2016-06-19 MED ORDER — PANCRELIPASE (LIP-PROT-AMYL) 12000-38000 UNITS PO CPEP
48000.0000 [IU] | ORAL_CAPSULE | Freq: Three times a day (TID) | ORAL | Status: DC
  Administered 2016-06-19 – 2016-06-20 (×3): 48000 [IU] via ORAL
  Filled 2016-06-19 (×3): qty 4

## 2016-06-19 MED ORDER — VANCOMYCIN HCL IN DEXTROSE 1-5 GM/200ML-% IV SOLN
1000.0000 mg | Freq: Two times a day (BID) | INTRAVENOUS | Status: DC
  Administered 2016-06-19 – 2016-06-20 (×2): 1000 mg via INTRAVENOUS
  Filled 2016-06-19 (×2): qty 200

## 2016-06-19 MED ORDER — LIDOCAINE HCL (PF) 1 % IJ SOLN
5.0000 mL | Freq: Once | INTRAMUSCULAR | Status: AC
  Administered 2016-06-19: 5 mL
  Filled 2016-06-19: qty 5

## 2016-06-19 NOTE — Progress Notes (Signed)
[facility]  Sharon MealingVictoria Kent 56 y.o. 05/20/1960  CC:  Abdominal pain, pancreatitis   Subjective: Patient's abdominal pain is improving. Tolerating current diet. Complaining of worsening left elbow pain. Also had a fever last night. Denied nausea or vomiting.  ROS : Positive for left elbow pain. Negative for nausea, vomiting   Objective: Vital signs in last 24 hours: Vitals:   06/18/16 2115 06/19/16 0549  BP:  130/82  Pulse: 98   Resp:  18  Temp:  99 F (37.2 C)    Physical Exam:  General:  Alert, cooperative, In mild distress from left elbow pain, appears stated age  Head:  Normocephalic, without obvious abnormality, atraumatic  Eyes:  , EOM's intact,   Lungs:   Clear to auscultation bilaterally, respirations unlabored  Heart:  Regular rate and rhythm, S1, S2 normal  Abdomen:   Soft, non-tender, bowel sounds active all four quadrants,  no masses,   Extremities: Redness and tenderness noted over left elbow with subcutaneous edema   Pulses:     Lab Results:  Recent Labs  06/16/16 0900 06/18/16 1127  NA 137 137  K 3.0* 3.1*  CL 111 108  CO2 17* 21*  GLUCOSE 85 113*  BUN 6 5*  CREATININE 0.72 0.67  CALCIUM 8.0* 8.5*    Recent Labs  06/16/16 0900  AST 29  ALT 13*  ALKPHOS 129*  BILITOT 0.9  PROT 5.7*  ALBUMIN 2.1*    Recent Labs  06/16/16 0900  WBC 9.7  HGB 9.9*  HCT 30.1*  MCV 100.7*  PLT 378   No results for input(s): LABPROT, INR in the last 72 hours.    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 - Abdominal pain. Improving - Cirrhosis per CT scan. Recommend outpatient workup - 3.4 cm right lobe lesion in the liver. Cannot exclude mass. Mild elevated AFP at 11.7 -  Possible developing pseudocyst or CT scan.  - Possible left elbow cellulitis. - Anemia. No overt bleeding.  Recommendations ---------------------- - Patient is  complaining of significant pain towards the left elbow. Possible developing cellulitis. Her abdominal pain is improving. Her fever last night could be from developing cellulitis in the left elbow. - Recommend follow-up with tertiary [facility] for further intervention for pancreatic duct stones for advance pancreatic/biliary intervention - Recommend repeat CT scan in 2-4 weeks once acute issues are resolved to follow-up for  right lobe liver lesion - GI will follow.   Kathi DerParag Derika Eckles MD, FACP 06/19/2016, 7:48 AM  Pager [phone]  If no answer or after 5 PM call [phone]

## 2016-06-19 NOTE — Progress Notes (Signed)
Pharmacy Antibiotic Note  Sharon Kent is a 56 y.o. female admitted on 06/15/2016 with acute on chronic alcoholic pancreatitis.  Pharmacy has been consulted for Vancomycin dosing for septic joint. Elbow synovial fluid Cx : pending. Labs noted below.  Goal Vanc Trough: 15-2920mcg/ml  Plan: Vancomycin 1000 mg IV q12h Monitor daily  clinical status, renal function, culture results if done.  Check vancomycin trough at steady state.   Height: 5\' 6"  (167.6 cm) Weight: 167 lb 3.2 oz (75.8 kg) IBW/kg (Calculated) : 59.3  Temp (24hrs), Avg:99.5 F (37.5 C), Min:97.9 F (36.6 C), Max:101.6 F (38.7 C)   Recent Labs Lab 06/13/16 0518 06/16/16 0900 06/18/16 1127  WBC 11.7* 9.7  --   CREATININE 0.87 0.72 0.67    Estimated Creatinine Clearance: 82.7 mL/min (by C-G formula based on SCr of 0.67 mg/dL).    Allergies  Allergen Reactions  . Morphine Nausea Only  . Oxycodone-Acetaminophen Other (See Comments)    Makes her "loopy."    Antimicrobials this admission: Vancomycin 3/12>>  Dose adjustments this admission:   Microbiology results: 3/12 elbow synovial fluid Cx : pending  Thank you for allowing pharmacy to be a part of this patient's care.  Sharon Kent, Sharon Kent 06/19/2016 5:57 PM

## 2016-06-19 NOTE — Progress Notes (Signed)
   Subjective: Sharon Kent was seen and evaluated today at bedside. Still endorses some nausea and abdominal pain however her main complaint today is left elbow pain and swelling. Never had this happen before. Denies any trauma or falls. Denies history of gout. Tolerating clear liquid diet.  Objective:  Vital signs in last 24 hours: Vitals:   06/18/16 1456 06/18/16 2113 06/18/16 2115 06/19/16 0549  BP: 126/82 137/85  130/82  Pulse: 78 99 98   Resp: 18 18  18   Temp: 97.5 F (36.4 C) (!) 101.6 F (38.7 C)  99 F (37.2 C)  TempSrc:  Oral  Oral  SpO2: 92% (!) 89% 92% 92%  Weight:      Height:       General: Chronically-ill appearing caucasian woman, in no acute distress. Standing in room. HENT: Moist oral mucosa. No conjunctival injection, icterus or ptosis.  Cardiovascular: Regular rate and rhythm. No murmur or rub. Pulmonary: CTAB, normal WOB.  Abdomen: Soft and nondistended, tenderness with deep palpation only Extremities: Left elbow swollen with overlaying erythema. Tender to touch.  Psych: Mood normal. Pleasant and open to exam.  MR abdomen 06/16/16: Severely motion degraded. Progressive irregular pancreatic ductal dilation (14 mm) with central filling defect. 5 x 4 x 2.8 cm loculated fluid collection adjacent to left mid abdominal wall. Dilated common bile duct (12 mm). Peripancreatic inflammatory changes consistent with acute on chronic pancreatitis. ERCP recommended when pt clinically improved.  CT abdomen/pelvis 05/27/16: Acute on chronic calcific pancreatitis. Fluid collection adjacent to spleen, could not exclude developing pseudocyst measuring 5.1 x 2.4 x 4.1 cm.   Assessment/Plan:This is a 56 year old female with Mhx significant for chronic alcoholic pancreatitis with exacerbation x 1 month. CT and MR show progressive pancreatic duct dilation.  Acute on Chronic Pancreatitis with Dilation of Pancreatic Duct, Dilated CBD, loculated fluid collection: Tolerating liquid diet. Still  has some nausea however attributes this to her elbow pain. Seen and evaluated by GI who recommend repeat scan in 2-4 weeks followed by outpatient follow-up with tertiary care center. I suspect patient will continue to have exacerbations until her follow-up. Patient is currently only on creon 24,000 units TID with meals. This equates to approximately 320 units/kg per meal. Minimum recs appear to be 500/kg per meal with max 2500/kg meal. Will increase Creon to 48,000 units TID with meals.  -Increase Creon to 48,000 units TID WC (Her max dose per meal is 180,000 units). Expect improvement of her pancreatic symptoms on therapeutic doses.  -Dilaudid 1 mg Q4H prn. Has been requiring this medicine consistently.  -Zofran PRN -AM BMET -GI on board and we continue to appreciate their advice regarding her constant pancreatitis.   Left elbow pain, large elbow effusion: As demonstrated on 2-view imaging yesterday. In setting of increased swelling, erythema and fever overnight, will do POC US-guided elbow aspiration for evaluation of possible gout vs septic joint. She denies any history of gout, elbow swelling or trauma to the area however did show remote fracture.  -US-guided aspiration -Sending fluid for cell count + gout eval  History of Asthma: Not in acute exacerbation. Currently smoking 1/2 ppd cigarettes. Albuterol PRN   Hypertension: Not hypertensive at present  Consult: GI Dispo: Anticipated discharge in 1-2 days. Ena Demary, D.O. Internal Medicine, PGY-1 Pager: [phone]

## 2016-06-19 NOTE — Procedures (Signed)
Elbow Arthrocentesis without Injection Procedure Note  Diagnosis: left elbow  Indications: inflammatory monoarthritis with effusion  Anesthesia: Lidocaine 1% without epinephrine  Procedure Details   Point of care ultrasound was used to identify the joint effusion and plan needle trajectory and depth. Consent was obtained for the procedure. The joint was prepped with chlorhexadine. A 22 gauge needle was inserted into the superolateral aspect of the joint. Local anaesthesia was achieved with 2 mL of 1% lidocaine. 7 ml of cloudy yellow fluid was removed from the joint and sent to the lab for analysis. The needle was removed and the area cleansed and dressed.  Complications:  None; patient tolerated the procedure well.  Dr. Carlynn PurlErik Hoffman was present throughout the procedure in a supervising role.  Fuller Planhristopher W Arnett Duddy, MD PGY-II Internal Medicine Resident Pager# [phone] 06/19/2016, 3:05 PM

## 2016-06-20 DIAGNOSIS — M11222 Other chondrocalcinosis, left elbow: Secondary | ICD-10-CM

## 2016-06-20 DIAGNOSIS — M1120 Other chondrocalcinosis, unspecified site: Secondary | ICD-10-CM | POA: Diagnosis not present

## 2016-06-20 DIAGNOSIS — I700 Atherosclerosis of aorta: Secondary | ICD-10-CM | POA: Diagnosis present

## 2016-06-20 LAB — BASIC METABOLIC PANEL
ANION GAP: 8 (ref 5–15)
BUN: <5 mg/dL — ABNORMAL LOW (ref 6–20)
CALCIUM: 8.5 mg/dL — AB (ref 8.9–10.3)
CO2: 22 mmol/L (ref 22–32)
Chloride: 106 mmol/L (ref 101–111)
Creatinine, Ser: 0.72 mg/dL (ref 0.44–1.00)
Glucose, Bld: 122 mg/dL — ABNORMAL HIGH (ref 65–99)
Potassium: 2.7 mmol/L — CL (ref 3.5–5.1)
Sodium: 136 mmol/L (ref 135–145)

## 2016-06-20 LAB — CBC
HCT: 26.6 % — ABNORMAL LOW (ref 36.0–46.0)
Hemoglobin: 8.6 g/dL — ABNORMAL LOW (ref 12.0–15.0)
MCH: 32.1 pg (ref 26.0–34.0)
MCHC: 32.3 g/dL (ref 30.0–36.0)
MCV: 99.3 fL (ref 78.0–100.0)
PLATELETS: 325 10*3/uL (ref 150–400)
RBC: 2.68 MIL/uL — ABNORMAL LOW (ref 3.87–5.11)
RDW: 14.9 % (ref 11.5–15.5)
WBC: 6.7 10*3/uL (ref 4.0–10.5)

## 2016-06-20 MED ORDER — HYDROMORPHONE HCL 2 MG PO TABS: 1.0000 mg | ORAL_TABLET | ORAL | Status: DC | PRN

## 2016-06-20 MED ORDER — BOOST / RESOURCE BREEZE PO LIQD: 1.0000 | Freq: Two times a day (BID) | ORAL | Status: DC

## 2016-06-20 MED ORDER — PANCRELIPASE (LIP-PROT-AMYL) 24000-76000 UNITS PO CPEP: 48000.0000 [IU] | ORAL_CAPSULE | Freq: Three times a day (TID) | ORAL | 1 refills | Status: DC

## 2016-06-20 MED ORDER — POTASSIUM CHLORIDE CRYS ER 20 MEQ PO TBCR
40.0000 meq | EXTENDED_RELEASE_TABLET | Freq: Two times a day (BID) | ORAL | Status: DC
  Administered 2016-06-20: 40 meq via ORAL
  Filled 2016-06-20: qty 2

## 2016-06-20 NOTE — Progress Notes (Signed)
Brief Nutrition Follow-Up Note  Pt initially evaluated on 06/16/16. Chart reviewed. Noted pt has been advanced to a regular diet, effective 06/19/16. Meal completion 100%. RD will add supplements (Boost Breeze po BID, each supplement provides 250 kcal and 9 grams of protein) in attempt to optimize nutritional status.   RD will continue to follow.   Johan Creveling A. Mayford KnifeWilliams, RD, LDN, CDE Pager: [phone] After hours Pager: [phone]

## 2016-06-20 NOTE — Progress Notes (Signed)
   Subjective: Ms. Ninetta LightsHatcher was seen and evaluated today at bedside. Abdominal pain, nausea and vomiting much improved since admission. Tolerating current diet. Continues to complain of elbow pain and swelling however improved since yesterday. Open to trying PO pain meds today + steroid injection in elbow followed by d/c.   Objective:  Vital signs in last 24 hours: Vitals:   06/19/16 0549 06/19/16 1402 06/19/16 2207 06/20/16 0455  BP: 130/82 123/69 118/69 115/66  Pulse:  83 80 78  Resp: 18 20 18 18   Temp: 99 F (37.2 C) 97.9 F (36.6 C) 98.1 F (36.7 C) 98.4 F (36.9 C)  TempSrc: Oral Oral Oral   SpO2: 92% 92% 94% 91%  Weight:      Height:       General: Chronically-ill appearing caucasian woman, in no acute distress. Standing in room. HENT: Moist oral mucosa. No conjunctival injection, icterus or ptosis.  Cardiovascular: Regular rate and rhythm. No murmur or rub. Pulmonary: CTAB, normal WOB.  Abdomen: Soft and nondistended, tenderness with deep palpation only Extremities: Left elbow swollen with overlaying erythema. Tender to touch.  Psych: Mood normal. Pleasant and open to exam.  MR abdomen 06/16/16: Severely motion degraded. Progressive irregular pancreatic ductal dilation (14 mm) with central filling defect. 5 x 4 x 2.8 cm loculated fluid collection adjacent to left mid abdominal wall. Dilated common bile duct (12 mm). Peripancreatic inflammatory changes consistent with acute on chronic pancreatitis. ERCP recommended when pt clinically improved.  CT abdomen/pelvis 05/27/16: Acute on chronic calcific pancreatitis. Fluid collection adjacent to spleen, could not exclude developing pseudocyst measuring 5.1 x 2.4 x 4.1 cm.   Left elbow plain films: Large joint effusion with soft tissue edema.   Assessment/Plan:This is a 56 year old female with Mhx significant for chronic alcoholic pancreatitis with exacerbation x 1 month. CT and MR show progressive pancreatic duct dilation.  Acute on  Chronic Pancreatitis with Dilation of Pancreatic Duct, Dilated CBD, loculated fluid collection: Tolerating regular diet. Nausea and abdominal pain nearly resolved. Increased Creon yesterday however pt did not take increased dose as she was unsure of why she was given extra pills. Agreeable to increased dose today. She has been seen by GI who rec repeat CT in 2-4 weeks followed by outpatient follow up at tertiary care center for advanced intervention.  -Increase Creon to 48,000 units TID WC (Her max dose per meal is 180,000 units). Expect improvement of her pancreatic symptoms on therapeutic doses.  -Transition to oral Dilaudid 1 mg Q4H prn and plan for this regimen upon discharge. -Zofran PRN. Has not required any so far today.  -GI on board  Left elbow pain, large elbow effusion, CPPD: Yesterdays US-guided aspiration yielded several mLs of cloudy yellow fluid. Gram stain showed many PMNs however was without organism. Culture pending. Cell count with >28,000 WBCs, 90% of those neutrophils. Also demonstrated calcium pyrophospate crystals. Ortho was consulted, blood cultures obtained and she was started on empiric Vancomycin. This will be discontinued today as clinical suspicion is low for septic joint. Only had 1 isolated fever. 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 Albuterol PRN   Hypertension: Not hypertensive at present  Consult: GI, ortho Dispo: Anticipated discharge today.  Ermelinda Eckert, D.O. Internal Medicine, PGY-1 Pager: [phone]

## 2016-06-20 NOTE — Progress Notes (Signed)
[facility]  Sharon MealingVictoria Kent 56 y.o. 01-01-1961  CC:  Abdominal pain, pancreatitis   Subjective: Patient's abdominal pain is improving. Tolerating current diet. Diagnosed with pseudogout of left elbow. Denied any active GI issues.  ROS : Positive for left elbow pain. Negative for nausea, vomiting   Objective: Vital signs in last 24 hours: Vitals:   06/19/16 2207 06/20/16 0455  BP: 118/69 115/66  Pulse: 80 78  Resp: 18 18  Temp: 98.1 F (36.7 C) 98.4 F (36.9 C)    Physical Exam:  General:  Alert, cooperative, In mild distress from left elbow pain, appears stated age  Head:  Normocephalic, without obvious abnormality, atraumatic  Eyes:  , EOM's intact,   Lungs:   Clear to auscultation bilaterally, respirations unlabored  Heart:  Regular rate and rhythm, S1, S2 normal  Abdomen:   Soft, non-tender, bowel sounds active all four quadrants,  no masses,   Extremities:  tenderness noted over left elbow with subcutaneous edema   Pulses:     Lab Results:  Recent Labs  06/18/16 1127 06/20/16 0712  NA 137 136  K 3.1* 2.7*  CL 108 106  CO2 21* 22  GLUCOSE 113* 122*  BUN 5* <5*  CREATININE 0.67 0.72  CALCIUM 8.5* 8.5*   No results for input(s): AST, ALT, ALKPHOS, BILITOT, PROT, ALBUMIN in the last 72 hours.  Recent Labs  06/20/16 0712  WBC 6.7  HGB 8.6*  HCT 26.6*  MCV 99.3  PLT 325   No results for input(s): LABPROT, INR in the last 72 hours.    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 - Abdominal pain. Resolved.  - Cirrhosis per CT scan. Recommend outpatient workup - 3.4 cm right lobe lesion in the liver. Cannot exclude mass. Mild elevated AFP at 11.7 -  Possible developing pseudocyst or CT scan.  - Possible left elbow cellulitis. - Anemia. No overt bleeding.  Recommendations ---------------------- - Patient with abdominal pain. No evidence of  overt bleeding. No plan for any further inpatient workup from GI standpoint. - Recommend follow-up with tertiary [facility] for further intervention for pancreatic duct stones for advance pancreatic/biliary intervention as an outpatient basis - Recommend repeat CT scan in 2-4 weeks once acute issues are resolved to follow-up for  right lobe liver lesion - GI will sign off. Call us back if needed. - Follow-up in GI clinic in 2 weeks.   Kathi DerParag Cassian Torelli MD, FACP 06/20/2016, 1:29 PM  Pager [phone]  If no answer or after 5 PM call [phone]

## 2016-06-20 NOTE — Procedures (Signed)
Elbow Injection Procedure Note  Pre-operative Diagnosis: left elbow pseudogout  Indications: Crystal induced arthropathy  Anesthesia: not required  Procedure Details   Consent was obtained for the procedure. The elbow was prepped with chlorhexadine. A time out was performed confirming the procedure and site. Using a 25 gauge needle the synovial space was injected with 2 mL 1% lidocaine and 1 mL of triamcinolone (KENALOG) 40mg /ml. The needle was removed and an adhesive bandage was applied.  Complications:  None; patient tolerated the procedure well.  Dr. Carlynn PurlErik Hoffman was present throughout the procedure in a supervising capacity.   Sharon Planhristopher W Rice, MD PGY-II Internal Medicine Resident Pager# [phone] 06/20/2016, 2:54 PM

## 2016-06-20 NOTE — Consult Note (Signed)
ORTHOPAEDIC CONSULTATION  REQUESTING PHYSICIAN: Gust RungErik C Hoffman, DO  Chief Complaint: Left Elbow pain and swelling  Assessment: Principal Problem:   Acute on chronic pancreatitis [facility](HCC) Active Problems:   Chronic pancreatitis (HCC)   Asthma   History of hypertension   Dilation of pancreatic duct   Personal history of calcium pyrophosphate deposition disease (CPPD)  Left Elbow pain, effusion - CPPD: Likely due to CPPD. Aspiration revealed calcium pyrophosphate crystals and did not show any organisms. Cell count 28K Aspiration Culture pending Blood cultures pending WBC normal. Afebrile. Elbow with significant effusion, but without erythema.  Distal RUE with swelling-patient notes multiple failed attempts at IV placement prior to right hand/forearm, and elbow swelling.  Plan: Continue to treat left elbow conservatively for CPPD and follow cultures. Please call should aspiration cultures suggest infection.  HPI: Sharon Kent is a 56 y.o. female who complains of 2 days of left elbow pain, swelling, and limited range of motion. Aspiration was performed yielding cloudy yellow fluid containing calcium pyrophosphate crystals suggesting CPPD. Orthopedics was consulted to evaluate and discussed potential surgical interventions should aspirate culture suggest infection.  Past Medical History:  Diagnosis Date  . Asthma   . Family history of adverse reaction to anesthesia    "don't think my son ever had anesthesia"  . GERD (gastroesophageal reflux disease)   . H/O chronic pancreatitis   . H/O ETOH abuse   . Headache    "monthly" (06/15/2016)  . History of blood transfusion 2000s   "when they explored my abdomen"  . Hx of pulmonary embolus   . Pancreatic abscess   . Pneumonia    "twice at least" (06/15/2016)   Past Surgical History:  Procedure Laterality Date  . EXPLORATORY LAPAROTOMY  2000s   "took out all my organs and washed the dead tissue"  . IVC FILTER PLACEMENT ([facility]  HX)  12/2010?  Marland Kitchen. LAPAROSCOPIC CHOLECYSTECTOMY  2000s   Social History   Social History  . Marital status: Divorced    Spouse name: N/A  . Number of children: N/A  . Years of education: N/A   Social History Main Topics  . Smoking status: Current Every Day Smoker    Packs/day: 0.50    Years: 31.00    Types: Cigarettes  . Smokeless tobacco: Never Used  . Alcohol use Yes     Comment: 06/15/2016 "couple shots of liquor q 6 months"  . Drug use: No  . Sexual activity: Not Currently   Other Topics Concern  . None   Social History Narrative  . None   Family History  Problem Relation Age of Onset  . Adopted: Yes  . Family history unknown: Yes   Allergies  Allergen Reactions  . Morphine Nausea Only  . Oxycodone-Acetaminophen Other (See Comments)    Makes her "loopy."   Prior to Admission medications   Medication Sig Start Date End Date Taking? Authorizing Provider  Albuterol Sulfate 108 (90 Base) MCG/ACT AEPB Inhale 2 puffs into the lungs as needed. Patient taking differently: Inhale 2 puffs into the lungs every 4 (four) hours as needed (for shortness of breath).  06/05/16  Yes Alexa Lucrezia Starch Burns, MD  ibuprofen (ADVIL,MOTRIN) 200 MG tablet Take 400 mg by mouth every 6 (six) hours as needed for mild pain.    Yes Historical Provider, MD  lipase/protease/amylase (CREON) 12000 units CPEP capsule Take 24,000 Units by mouth 3 (three) times daily with meals. 11/27/15  Yes Historical Provider, MD  Multiple Vitamins-Minerals (MULTIVITAMIN WITH  MINERALS) tablet Take 1 tablet by mouth daily.   Yes Historical Provider, MD  ondansetron (ZOFRAN ODT) 4 MG disintegrating tablet Take 1 tablet (4 mg total) by mouth every 8 (eight) hours as needed for nausea or vomiting. 05/30/16  Yes Courteney Lyn Mackuen, MD  oxyCODONE (OXY IR/ROXICODONE) 5 MG immediate release tablet Take 1 tablet (5 mg total) by mouth every 6 (six) hours as needed for severe pain. 06/13/16  Yes Bethel Born, PA-C  pantoprazole  (PROTONIX) 40 MG tablet Take 1 tablet (40 mg total) by mouth daily. 05/15/16  Yes Alexa Lucrezia Starch, MD  potassium chloride SA (K-DUR,KLOR-CON) 20 MEQ tablet Take 1 tablet (20 mEq total) by mouth 2 (two) times daily. Patient taking differently: Take 20 mEq by mouth daily.  04/27/16  Yes Eulah Pont, MD  promethazine (PHENERGAN) 25 MG suppository Place 1 suppository (25 mg total) rectally every 6 (six) hours as needed for nausea or vomiting. 06/10/16  Yes Eber Hong, MD  traMADol (ULTRAM) 50 MG tablet Take 1 tablet (50 mg total) by mouth every 8 (eight) hours as needed. Patient taking differently: Take 50 mg by mouth every 8 (eight) hours as needed for moderate pain.  05/28/16   Arnetha Courser, MD   Dg Elbow 2 Views Left  Result Date: 06/18/2016 CLINICAL DATA:  Left elbow pain for 1 day.  No known recent injury. EXAM: LEFT ELBOW - 2 VIEW COMPARISON:  None. FINDINGS: Remote fracture and deformity at the radiocapitellar joint noted. Moderate degenerative changes at the humeral ulnar joint noted. There is no evidence of acute fracture or dislocation. A large joint effusion and soft tissue swelling is noted. IMPRESSION: Large elbow effusion and soft tissue swelling without acute bony abnormality. Degenerative and posttraumatic changes as described. Electronically Signed   By: Harmon Pier M.D.   On: 06/18/2016 11:21    Positive ROS: All other systems have been reviewed and were otherwise negative with regard to this complaint with the exception of those mentioned in the HPI and as above.  Objective: Labs cbc  Recent Labs  06/20/16 0712  WBC 6.7  HGB 8.6*  HCT 26.6*  PLT 325     Recent Labs  06/18/16 1127 06/20/16 0712  NA 137 136  K 3.1* 2.7*  CL 108 106  CO2 21* 22  GLUCOSE 113* 122*  BUN 5* <5*  CREATININE 0.67 0.72  CALCIUM 8.5* 8.5*    Physical Exam: Vitals:   06/19/16 2207 06/20/16 0455  BP: 118/69 115/66  Pulse: 80 78  Resp: 18 18  Temp: 98.1 F (36.7 C) 98.4 F (36.9 C)    General: Alert, no acute distress, Conversant Mental status: Alert and Oriented x3 Neurologic: Speech Clear and organized, no gross focal findings or movement disorder appreciated. Respiratory: No cyanosis, no use of accessory musculature Cardiovascular: No pedal edema GI: Abdomen is soft and non-tender, non-distended. Skin: Warm and dry.  No lesions in the area of chief complaint Extremities: Warm and dry Psychiatric: Patient is competent for consent with normal mood and affect  MUSCULOSKELETAL:  Left upper extremity with swelling at the elbow limiting range of motion by about 30 extension. Neurovascularly intact distally.  Skin without lesion, erythema. Right hand and forearm also swollen.  Other extremities are atraumatic with painless ROM and NVI.   Albina Billet III PA-C 06/20/2016 8:17 AM

## 2016-06-20 NOTE — Progress Notes (Signed)
CRITICAL VALUE ALERT  Critical value received: k+ 2.7  Date of notification: 06/20/16  Time of notification:  0825  Critical value read back:yes  Nurse who received alert:  Arman Bogusorothy Shahara Hartsfield RN  MD notified (1st page):  Dr Vincente LibertyMolt  Time of first page:  [phone]

## 2016-06-21 ENCOUNTER — Inpatient Hospital Stay (HOSPITAL_COMMUNITY)
Admission: EM | Admit: 2016-06-21 | Discharge: 2016-06-22 | DRG: 440 | Disposition: A | Discharge status: Other Acute Inpatient Hospital | Payer: Managed Care, Other (non HMO) | Attending: Internal Medicine | Admitting: Internal Medicine

## 2016-06-21 ENCOUNTER — Encounter (HOSPITAL_COMMUNITY): Payer: Self-pay | Admitting: *Deleted

## 2016-06-21 DIAGNOSIS — K860 Alcohol-induced chronic pancreatitis: Secondary | ICD-10-CM | POA: Diagnosis present

## 2016-06-21 DIAGNOSIS — M25422 Effusion, left elbow: Secondary | ICD-10-CM | POA: Diagnosis present

## 2016-06-21 DIAGNOSIS — K7460 Unspecified cirrhosis of liver: Secondary | ICD-10-CM | POA: Diagnosis present

## 2016-06-21 DIAGNOSIS — K859 Acute pancreatitis without necrosis or infection, unspecified: Secondary | ICD-10-CM | POA: Diagnosis present

## 2016-06-21 DIAGNOSIS — Z95828 Presence of other vascular implants and grafts: Secondary | ICD-10-CM | POA: Diagnosis not present

## 2016-06-21 DIAGNOSIS — Z79899 Other long term (current) drug therapy: Secondary | ICD-10-CM

## 2016-06-21 DIAGNOSIS — K838 Other specified diseases of biliary tract: Secondary | ICD-10-CM

## 2016-06-21 DIAGNOSIS — J45909 Unspecified asthma, uncomplicated: Secondary | ICD-10-CM | POA: Diagnosis present

## 2016-06-21 DIAGNOSIS — M25522 Pain in left elbow: Secondary | ICD-10-CM

## 2016-06-21 DIAGNOSIS — R160 Hepatomegaly, not elsewhere classified: Secondary | ICD-10-CM | POA: Diagnosis present

## 2016-06-21 DIAGNOSIS — Z9049 Acquired absence of other specified parts of digestive tract: Secondary | ICD-10-CM

## 2016-06-21 DIAGNOSIS — F1021 Alcohol dependence, in remission: Secondary | ICD-10-CM | POA: Diagnosis not present

## 2016-06-21 DIAGNOSIS — M1120 Other chondrocalcinosis, unspecified site: Secondary | ICD-10-CM | POA: Diagnosis present

## 2016-06-21 DIAGNOSIS — F17210 Nicotine dependence, cigarettes, uncomplicated: Secondary | ICD-10-CM

## 2016-06-21 DIAGNOSIS — R634 Abnormal weight loss: Secondary | ICD-10-CM | POA: Diagnosis not present

## 2016-06-21 DIAGNOSIS — D649 Anemia, unspecified: Secondary | ICD-10-CM | POA: Diagnosis present

## 2016-06-21 DIAGNOSIS — K219 Gastro-esophageal reflux disease without esophagitis: Secondary | ICD-10-CM | POA: Diagnosis present

## 2016-06-21 DIAGNOSIS — I10 Essential (primary) hypertension: Secondary | ICD-10-CM | POA: Diagnosis present

## 2016-06-21 DIAGNOSIS — E876 Hypokalemia: Secondary | ICD-10-CM | POA: Diagnosis present

## 2016-06-21 DIAGNOSIS — R109 Unspecified abdominal pain: Secondary | ICD-10-CM | POA: Diagnosis not present

## 2016-06-21 DIAGNOSIS — Z8679 Personal history of other diseases of the circulatory system: Secondary | ICD-10-CM

## 2016-06-21 DIAGNOSIS — K8689 Other specified diseases of pancreas: Secondary | ICD-10-CM

## 2016-06-21 DIAGNOSIS — K861 Other chronic pancreatitis: Secondary | ICD-10-CM

## 2016-06-21 DIAGNOSIS — K769 Liver disease, unspecified: Secondary | ICD-10-CM | POA: Diagnosis present

## 2016-06-21 DIAGNOSIS — Z885 Allergy status to narcotic agent status: Secondary | ICD-10-CM

## 2016-06-21 LAB — COMPREHENSIVE METABOLIC PANEL
ALT: 13 U/L — ABNORMAL LOW (ref 14–54)
ANION GAP: 11 (ref 5–15)
AST: 19 U/L (ref 15–41)
Albumin: 2.7 g/dL — ABNORMAL LOW (ref 3.5–5.0)
Alkaline Phosphatase: 139 U/L — ABNORMAL HIGH (ref 38–126)
BILIRUBIN TOTAL: 0.7 mg/dL (ref 0.3–1.2)
BUN: <5 mg/dL — ABNORMAL LOW (ref 6–20)
CO2: 21 mmol/L — ABNORMAL LOW (ref 22–32)
Calcium: 9.4 mg/dL (ref 8.9–10.3)
Chloride: 107 mmol/L (ref 101–111)
Creatinine, Ser: 0.71 mg/dL (ref 0.44–1.00)
GFR calc Af Amer: >60 mL/min (ref >60)
Glucose, Bld: 138 mg/dL — ABNORMAL HIGH (ref 65–99)
POTASSIUM: 2.7 mmol/L — AB (ref 3.5–5.1)
Sodium: 139 mmol/L (ref 135–145)
TOTAL PROTEIN: 7.6 g/dL (ref 6.5–8.1)

## 2016-06-21 LAB — CBC
HEMATOCRIT: 33.6 % — AB (ref 36.0–46.0)
HEMOGLOBIN: 11.3 g/dL — AB (ref 12.0–15.0)
MCH: 32.7 pg (ref 26.0–34.0)
MCHC: 33.6 g/dL (ref 30.0–36.0)
MCV: 97.1 fL (ref 78.0–100.0)
Platelets: 543 10*3/uL — ABNORMAL HIGH (ref 150–400)
RBC: 3.46 MIL/uL — ABNORMAL LOW (ref 3.87–5.11)
RDW: 14.6 % (ref 11.5–15.5)
WBC: 11.9 10*3/uL — AB (ref 4.0–10.5)

## 2016-06-21 LAB — LIPASE, BLOOD: Lipase: 560 U/L — ABNORMAL HIGH (ref 11–51)

## 2016-06-21 LAB — MAGNESIUM: Magnesium: 1.6 mg/dL — ABNORMAL LOW (ref 1.7–2.4)

## 2016-06-21 MED ORDER — POTASSIUM CHLORIDE CRYS ER 20 MEQ PO TBCR: 40.0000 meq | EXTENDED_RELEASE_TABLET | Freq: Once | ORAL | Status: DC

## 2016-06-21 MED ORDER — MAGNESIUM SULFATE 2 GM/50ML IV SOLN
2.0000 g | Freq: Once | INTRAVENOUS | Status: AC
  Administered 2016-06-22: 2 g via INTRAVENOUS
  Filled 2016-06-21: qty 50

## 2016-06-21 MED ORDER — POTASSIUM CHLORIDE IN NACL 40-0.9 MEQ/L-% IV SOLN
INTRAVENOUS | Status: DC
  Administered 2016-06-22: 150 mL/h via INTRAVENOUS
  Filled 2016-06-21 (×3): qty 1000

## 2016-06-21 MED ORDER — ONDANSETRON HCL 4 MG/2ML IJ SOLN
4.0000 mg | Freq: Four times a day (QID) | INTRAMUSCULAR | Status: DC | PRN
  Administered 2016-06-22 (×3): 4 mg via INTRAVENOUS
  Filled 2016-06-21 (×3): qty 2

## 2016-06-21 MED ORDER — ONDANSETRON HCL 4 MG/2ML IJ SOLN
4.0000 mg | Freq: Once | INTRAMUSCULAR | Status: AC
  Administered 2016-06-21: 4 mg via INTRAVENOUS
  Filled 2016-06-21: qty 2

## 2016-06-21 MED ORDER — HYDROMORPHONE HCL 1 MG/ML IJ SOLN
1.0000 mg | Freq: Once | INTRAMUSCULAR | Status: AC
  Administered 2016-06-21: 1 mg via INTRAVENOUS
  Filled 2016-06-21: qty 1

## 2016-06-21 MED ORDER — SODIUM CHLORIDE 0.9 % IV SOLN
30.0000 meq | Freq: Once | INTRAVENOUS | Status: AC
  Administered 2016-06-21: 30 meq via INTRAVENOUS
  Filled 2016-06-21: qty 15

## 2016-06-21 MED ORDER — SODIUM CHLORIDE 0.9 % IV BOLUS (SEPSIS)
1000.0000 mL | Freq: Once | INTRAVENOUS | Status: AC
  Administered 2016-06-21: 1000 mL via INTRAVENOUS

## 2016-06-21 MED ORDER — HYDROMORPHONE HCL 1 MG/ML IJ SOLN: 1.0000 mg | INTRAMUSCULAR | Status: DC | PRN

## 2016-06-21 NOTE — ED Triage Notes (Signed)
Pt was just dc from hospital yesterday. Return of right side abd pain and n/v last night. No relief with po meds. Denies diarrhea.

## 2016-06-21 NOTE — H&P (Signed)
Date: 06/21/2016               Patient Name:  Sharon Kent MRN: [id number]  DOB: 11-18-1960 Age / Sex: 56 y.o., female   PCP: Gust Rung, DO         Medical Service: Internal Medicine Teaching Service         Attending Physician: Dr. Bethann Berkshire, MD    First Contact: Dr. Vincente Liberty Pager: [phone]  Second Contact: Dr. Dimple Casey Pager: [phone]       After Hours (After 5p/  First Contact Pager: [phone]  weekends / holidays): Second Contact Pager: [phone]   Chief Complaint: Nausea, vomiting and abdominal pain.  History of Present Illness: Sharon Kent is a 56 y.o. female,with past medical history of alcohol abuse complicated by chronic pancreatitis and cirrhosis, with multiple admissions due to acute on chronic pancreatitis, recently discharged yesterday, came back again with complaint of nausea, vomiting and abdominal pain.  She was discharged yesterday after having an acute episode of pancreatitis, she was able to tolerate food during her stay, her lipase on 06/18/2016 was 59. According to patient she had a very light dinner at home last night consisting of some crackers and juice. When she woke up this morning around 8:30AM, she was not feeling good, later she started nausea and vomiting along with right upper quadrant abdominal pain, similar to her previous hospitalization. She was unable to tolerate any by mouth intake or medicines, which prompted her to come back to hospital.  She was also complaining of few chills, denies any fever. She denies any diarrhea or constipation, dysuria or hematuria.  Meds:  Current Meds  Medication Sig  . Albuterol Sulfate 108 (90 Base) MCG/ACT AEPB Inhale 2 puffs into the lungs as needed. (Patient taking differently: Inhale 2 puffs into the lungs every 4 (four) hours as needed (for shortness of breath). )  . ibuprofen (ADVIL,MOTRIN) 200 MG tablet Take 400 mg by mouth every 6 (six) hours as needed for mild pain.   Marland Kitchen lipase/protease/amylase  24000-76000 units CPEP Take 2 capsules (48,000 Units total) by mouth 3 (three) times daily with meals.  . Multiple Vitamins-Minerals (MULTIVITAMIN WITH MINERALS) tablet Take 1 tablet by mouth daily.  . ondansetron (ZOFRAN ODT) 4 MG disintegrating tablet Take 1 tablet (4 mg total) by mouth every 8 (eight) hours as needed for nausea or vomiting.  . potassium chloride SA (K-DUR,KLOR-CON) 20 MEQ tablet Take 1 tablet (20 mEq total) by mouth 2 (two) times daily. (Patient taking differently: Take 20 mEq by mouth daily. )  . traMADol (ULTRAM) 50 MG tablet Take 1 tablet (50 mg total) by mouth every 8 (eight) hours as needed. (Patient taking differently: Take 50 mg by mouth every 8 (eight) hours as needed for moderate pain. )     Allergies: Allergies as of 06/21/2016 - Review Complete 06/21/2016  Allergen Reaction Noted  . Morphine Nausea Only 11/23/2015  . Oxycodone-acetaminophen Other (See Comments) 12/26/2012   Past Medical History:  Diagnosis Date  . Asthma   . Family history of adverse reaction to anesthesia    "don't think my son ever had anesthesia"  . GERD (gastroesophageal reflux disease)   . H/O chronic pancreatitis   . H/O ETOH abuse   . Headache    "monthly" (06/15/2016)  . History of blood transfusion 2000s   "when they explored my abdomen"  . Hx of pulmonary embolus   . Pancreatic abscess   . Pneumonia    "  twice at least" (06/15/2016)    Family History: PATIENT ADOPTED. Family history unknown.  Social History: Smokes half pack per day for last 31 years, recently decreased to 2 cigarettes per day. Denies any recent alcohol use, according to patient her last alcohol intake was 3 weeks ago. Denies any illicit drug use. Lives at home with her dog. Works as a Engineer, production.    Review of Systems: A complete ROS was negative except as per HPI.   Physical Exam: Blood pressure (!) 164/106, pulse 83, temperature 98.3 F (36.8 C), temperature source Oral, resp. rate 15, SpO2 95 %. Vitals:     06/21/16 1936 06/21/16 2015 06/21/16 2100 06/21/16 2145  BP: (!) 172/114 (!) 150/113 (!) 146/106 (!) 164/106  Pulse: 83 89 84 83  Resp: 18 17 20 15   Temp:      TempSrc:      SpO2: 99% 98% 97% 95%   General: Vital signs reviewed.  Patient is well-developed and well-nourished, in no acute distress and cooperative with exam.  Head: Normocephalic and atraumatic. Eyes: EOMI, conjunctivae normal, no scleral icterus.  Neck: Supple, trachea midline, normal ROM, no JVD, masses, thyromegaly, or carotid bruit present.  Cardiovascular: RRR, S1 normal, S2 normal, no murmurs, gallops, or rubs. Pulmonary/Chest: Clear to auscultation bilaterally, no wheezes, rales, or rhonchi. Abdominal: Soft, Right upper quadrant, epigastric and left upper quadrant tenderness, non-distended, BS +, no masses, organomegaly, or guarding present.  Musculoskeletal: Mild subcutaneous edema of left elbow with chronic left elbow deformity. Extremities: No lower extremity edema bilaterally,  pulses symmetric and intact bilaterally. No cyanosis or clubbing. Neurological: A&O x3, Strength is normal and symmetric bilaterally, cranial nerve II-XII are grossly intact, no focal motor deficit, sensory intact to light touch bilaterally.  Skin: Warm, dry and intact. No rashes or erythema. Psychiatric: Normal mood and affect. speech and behavior is normal. Cognition and memory are normal.  Labs. CMP Latest Ref Rng & Units 06/21/2016 06/20/2016 06/18/2016  Glucose 65 - 99 mg/dL 409(W) 119(J) 478(G)  BUN 6 - 20 mg/dL <9(F) <6(O) 5(L)  Creatinine 0.44 - 1.00 mg/dL 1.30 8.65 7.84  Sodium 135 - 145 mmol/L 139 136 137  Potassium 3.5 - 5.1 mmol/L 2.7(LL) 2.7(LL) 3.1(L)  Chloride 101 - 111 mmol/L 107 106 108  CO2 22 - 32 mmol/L 21(L) 22 21(L)  Calcium 8.9 - 10.3 mg/dL 9.4 6.9(G) 2.9(B)  Total Protein 6.5 - 8.1 g/dL 7.6 - -  Total Bilirubin 0.3 - 1.2 mg/dL 0.7 - -  Alkaline Phos 38 - 126 U/L 139(H) - -  AST 15 - 41 U/L 19 - -  ALT 14 - 54  U/L 13(L) - -   CBC Latest Ref Rng & Units 06/21/2016 06/20/2016 06/16/2016  WBC 4.0 - 10.5 K/uL 11.9(H) 6.7 9.7  Hemoglobin 12.0 - 15.0 g/dL 11.3(L) 8.6(L) 9.9(L)  Hematocrit 36.0 - 46.0 % 33.6(L) 26.6(L) 30.1(L)  Platelets 150 - 400 K/uL 543(H) 325 378   Lipase. 560  EKG: Sinus rhythm with few PVCs and sinus pause, new from previous EKG.  Assessment & Plan by Problem:  Mychelle Kendra is a 56 y.o. female,with past medical history of alcohol abuse complicated by chronic pancreatitis and cirrhosis, with multiple admissions due to acute on chronic pancreatitis, recently discharged yesterday, came back again with complaint of nausea, vomiting and abdominal pain.  Acute on chronic pancreatitis. She has multiple episodes of acute on chronic pancreatitis with them last couple of month. Her lipase bumped up to 560 today as compared to  59 2 days ago. Her MRI abdomen done during her previous hospitalization shows progressive irregular dilation of pancreatic duct.Dilated CBD. Surgically absent GB. There was a concern for pancreatic duct calculi, GI was recommending referral to tertiary care center for further evaluation and treatment. She has few red flags, unintentional weight loss of about 13 pounds in 2 months, elevated AFP, suspicious lesion in right hepatic lobe on CT abdomen, irregular dilation of pancreatic duct, alcohol abuse, liver cirrhosis and chronic pancreatitis-although MRI abdomen done during previous admission was negative for any mass lesion, but it was motion degraded and they were recommending liver and pancreatic protocol CT for further evaluation. She will benefit from transfer to a tertiary Medical Center for further evaluation and treatment. -Admit to telemetry. -Nothing by mouth. -IVNS 150 mL per hour. -IV Zofran for nausea and vomiting. -IV Dilaudid when necessary for pain. -Continue home dose of Creon.  Hypokalemia. She was found to have potassium of 2.7. She got 30 mEq of  KCl in ED. -Replace 40 mEq of KCl with IV fluid. -Repeat BMP tomorrow morning.  Left elbow pain. Most likely due to pseudogout, as her joint aspirate done during last hospitalization shows calcium pyruvate crystals and no growth on culture. -Continue conservative management.  History of his Asthma. No current exacerbation. -Continue home albuterol when necessary.  DVT prophylaxis. Lovenox CODE STATUS. Full Diet. Nothing by mouth except ice chips.  Dispo: Admit patient to Inpatient with expected length of stay greater than 2 midnights.  Signed: Arnetha CourserSumayya Cathryne Mancebo, MD 06/21/2016, 9:57 PM  Pager: [phone]

## 2016-06-21 NOTE — ED Notes (Signed)
Attempted lab draw x2

## 2016-06-21 NOTE — ED Notes (Signed)
MD made aware of K+ of 2.7

## 2016-06-21 NOTE — Discharge Summary (Signed)
Name: Sharon Kent MRN: [id number] DOB: Nov 16, 1960 56 y.o. PCP: Gust Rung, DO  Date of Admission: 06/15/2016  3:07 PM Date of Discharge: 06/20/2016 Attending Physician: Marthenia Rolling. Hoffman, DO  Discharge Diagnosis: 1. Acute on Chronic Pancreatitis 2. Pancreatic Duct Dilation 3. Calcium Pyrophosphate Deposition Disease  Principal Problem:   Acute on chronic pancreatitis (HCC) Active Problems:   Chronic pancreatitis (HCC)   Asthma   History of hypertension   Dilation of pancreatic duct   Calcium pyrophosphate deposition disease   Thoracic aortic atherosclerosis [facility])  Discharge Medications: Allergies as of 06/20/2016      Reactions   Morphine Nausea Only   Oxycodone-acetaminophen Other (See Comments)   Makes her "loopy."      Medication List    STOP taking these medications   lipase/protease/amylase 14782 units Cpep capsule Commonly known as:  CREON Replaced by:  Pancrelipase (Lip-Prot-Amyl) 24000-76000 units Cpep   oxyCODONE 5 MG immediate release tablet Commonly known as:  Oxy IR/ROXICODONE   promethazine 25 MG suppository Commonly known as:  PHENERGAN     TAKE these medications   Albuterol Sulfate 108 (90 Base) MCG/ACT Aepb Inhale 2 puffs into the lungs as needed. What changed:  when to take this  reasons to take this   ibuprofen 200 MG tablet Commonly known as:  ADVIL,MOTRIN Take 400 mg by mouth every 6 (six) hours as needed for mild pain.   multivitamin with minerals tablet Take 1 tablet by mouth daily.   ondansetron 4 MG disintegrating tablet Commonly known as:  ZOFRAN ODT Take 1 tablet (4 mg total) by mouth every 8 (eight) hours as needed for nausea or vomiting.   Pancrelipase (Lip-Prot-Amyl) 24000-76000 units Cpep Take 2 capsules (48,000 Units total) by mouth 3 (three) times daily with meals. Replaces:  lipase/protease/amylase 95621 units Cpep capsule   pantoprazole 40 MG tablet Commonly known as:  PROTONIX Take 1 tablet (40 mg total)  by mouth daily.   potassium chloride SA 20 MEQ tablet Commonly known as:  K-DUR,KLOR-CON Take 1 tablet (20 mEq total) by mouth 2 (two) times daily. What changed:  when to take this   traMADol 50 MG tablet Commonly known as:  ULTRAM Take 1 tablet (50 mg total) by mouth every 8 (eight) hours as needed. What changed:  reasons to take this       Disposition and follow-up:   [name] was discharged from [facility] in stable condition.  At the hospital follow up visit please address:  1. Abdominal pain: Ensure patient is following a bland diet and de-escalates PO intake in accordance with her abdominal pain. She seems to respond well to IVF, pain control. Her dose of pancreatic enzymes was INCREASED upon discharge, please ensure compliance as this likely will help her abdominal pain. She will need to be seen by a [facility].    Pancreatic duct dilation: She will require further evaluation at a TERTIARY CARE CENTER in approximately 2-4 weeks. She was seen by GI at [facility] who recommended this follow-up and they did not offer any intervention that could be provided at [facility].   Calcium pyrophosphate Deposition Disease: She will require additional counseling on this disease. Ensure she is trying NSAIDs to assist with pain. She was given an RX for PO Dilaudid 06/20/16.   2.  Labs / imaging needed at time of follow-up: None.  3.  Pending labs/ test needing follow-up: None.  Follow-up Appointments: HFU at internal medicine clinic 1  week after discharge Referral to [facility] center for advanced pancreatic imaging vs intervention.   Hospital Course by problem list: Principal Problem:   Acute on chronic pancreatitis [facility]) Active Problems:   Chronic pancreatitis (HCC)   Asthma   History of hypertension   Dilation of pancreatic duct   Calcium pyrophosphate deposition disease   Thoracic aortic atherosclerosis (HCC)   1. Acute on Chronic  Alcohol-Related Pancreatitis, Pancreatic Duct Dilation [name] is 56 y.o who was directly admitted from clinic 06/15/16 after a 1 month history of frequent exacerbations of her chronic pancreatitis. She describes worsening abdominal pain, nausea and vomiting. She was admitted 3 weeks prior to this admission and was discharged home after tolerating diet. Since then, she has been seen 3 times in the ED for the same, each time improving with IVF and pain medications. PTA her last drink was about 1 month ago. During her hospitalization, her nausea and abdominal pain improved steadily after bowel rest and adequate pain control. She had significant improvement of her abdominal pain at time of discharge and was tolerating a regular diet without issue. MRI abdomen was obtained which showed progressive dilation of the pancreatic duct as well as changes consistent with acute on chronic pancreatitis. GI was then consulted who recommended outpatient follow-up with a tertiary care center for advanced pancreatic intervention following a repeat CT scan in 2-4 weeks. Her creon dose was increased at discharge as well.   2. Calcium Pyrophosphate Deposition Disease On hospital day 2, patient developed edema of her left elbow after receiving fluid replacement in that arm. Plain films showed a large elbow joint effusion with soft tissue edema as well as an old fracture. US-guided aspiration was performed at bedside which yielded ~66mL cloudy fluid. Cell count showed 28,000 WBCs, 90% of those were neutrophils and intracellular calcium pyrophosphate crystals were appreciated. Culture returned negative. She received an intraarticular steroid injection with some improvement of her pain. She was given rx for PO Dilaudid to help with this pain.   Discharge Vitals:   BP 115/66 (BP Location: Right Arm)   Pulse 78   Temp 98.4 F (36.9 C)   Resp 18   Ht 5\' 6"  (1.676 m)   Wt 167 lb 3.2 oz (75.8 kg)   SpO2 91%   BMI 26.99  kg/m   Pertinent Labs, Studies, and Procedures:  MRI abdomen: Progressive dilation of pancreatic duct (14 mm). Dilated CBD. Surgically absent GB. Changes consistent with acute on chronic pancreatitis. No mass was appreciated in liver nor pancreas.  Synovial Fluid: 28,000 WBC, 90% neutrophils. Gram stain negative. Intracellular calcium pyrophosphate crystals.   Discharge Instructions: Discharge Instructions    Call MD for:  persistant nausea and vomiting    Complete by:  As directed    Call MD for:  redness, tenderness, or signs of infection (pain, swelling, redness, odor or green/yellow discharge around incision site)    Complete by:  As directed    Call MD for:  severe uncontrolled pain    Complete by:  As directed    Call MD for:  temperature >100.4    Complete by:  As directed    Diet - low sodium heart healthy    Complete by:  As directed    Discharge instructions    Complete by:  As directed    After discharge please call us back if you notice high fevers to 101 or greater or develop worsening pain/redness in your elbow.  If your abdominal  pain and nausea comes back please contact the [facility] immediately since your pancreatitis symptoms could return.  Increase your dose of creon at home to the new higher dose of 48,000 units as this is a more therapeutic rather than the low starting dose.  You will need to follow up wish us or GI clinic in 2 weeks for repeat abdominal imaging.  [facility] to [facility] an appointment or we will reach out to you within this time.   Increase activity slowly    Complete by:  As directed     As above. PLEASE CALL THE INTERNAL MEDICINE CLINIC SHOULD YOUR ABDOMINAL PAIN WORSEN.   SignedNoemi Chapel: Rudi Bunyard, DO 06/21/2016, 5:31 PM   Pager: [phone]

## 2016-06-21 NOTE — ED Notes (Signed)
EKG given to provider 

## 2016-06-21 NOTE — ED Notes (Signed)
Was unable to get blood work at triage, called phleb to obtain

## 2016-06-21 NOTE — ED Provider Notes (Signed)
[facility] DEPT Provider Note   CSN: [id number] Arrival date & time: 06/21/16  1641     History   Chief Complaint Chief Complaint  Patient presents with  . Abdominal Pain  . Emesis    HPI Sharon Kent is a 56 y.o. female.  Patient complains of epigastric pain and vomiting. She was discharged from the hospital yesterday with pancreatitis.    Abdominal Pain   This is a recurrent problem. The current episode started more than 2 days ago. The problem occurs constantly. The problem has not changed since onset.The pain is associated with an unknown factor. The pain is located in the epigastric region. The quality of the pain is aching. The pain is at a severity of 6/10. The pain is moderate. Associated symptoms include nausea and vomiting. Pertinent negatives include diarrhea, frequency, hematuria and headaches. Nothing aggravates the symptoms. Nothing relieves the symptoms. Past workup includes GI consult.    Past Medical History:  Diagnosis Date  . Asthma   . Family history of adverse reaction to anesthesia    "don't think my son ever had anesthesia"  . GERD (gastroesophageal reflux disease)   . H/O chronic pancreatitis   . H/O ETOH abuse   . Headache    "monthly" (06/15/2016)  . History of blood transfusion 2000s   "when they explored my abdomen"  . Hx of pulmonary embolus   . Pancreatic abscess   . Pneumonia    "twice at least" (06/15/2016)    Patient Active Problem List   Diagnosis Date Noted  . 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 Thoracic aortic atherosclerosis (HCC) 06/20/2016  . Dilation of pancreatic duct 06/16/2016  . Pancreatitis, acute 06/15/2016  . Acute on chronic pancreatitis (HCC) 06/15/2016  . Hepatic lesion 06/05/2016  . History of hypertension 06/05/2016  . Possible Cirrhosis 06/05/2016  . Hypokalemia   . Chronic pancreatitis (HCC) 04/21/2016  . Asthma 04/21/2016  . 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 S/P cholecystectomy  04/21/2016  . Tobacco abuse 04/21/2016  . Alcohol abuse 04/21/2016    Past Surgical History:  Procedure Laterality Date  . EXPLORATORY LAPAROTOMY  2000s   "took out all my organs and washed the dead tissue"  . IVC FILTER PLACEMENT ([facility] HX)  12/2010?  Marland Kitchen. LAPAROSCOPIC CHOLECYSTECTOMY  2000s    OB History    No data available       Home Medications    Prior to Admission medications   Medication Sig Start Date End Date Taking? Authorizing Provider  Albuterol Sulfate 108 (90 Base) MCG/ACT AEPB Inhale 2 puffs into the lungs as needed. Patient taking differently: Inhale 2 puffs into the lungs every 4 (four) hours as needed (for shortness of breath).  06/05/16  Yes Alexa Lucrezia Starch Burns, MD  ibuprofen (ADVIL,MOTRIN) 200 MG tablet Take 400 mg by mouth every 6 (six) hours as needed for mild pain.    Yes Historical Provider, MD  lipase/protease/amylase 24000-76000 units CPEP Take 2 capsules (48,000 Units total) by mouth 3 (three) times daily with meals. 06/20/16  Yes Fuller Planhristopher W Rice, MD  Multiple Vitamins-Minerals (MULTIVITAMIN WITH MINERALS) tablet Take 1 tablet by mouth daily.   Yes Historical Provider, MD  ondansetron (ZOFRAN ODT) 4 MG disintegrating tablet Take 1 tablet (4 mg total) by mouth every 8 (eight) hours as needed for nausea or vomiting. 05/30/16  Yes Courteney Lyn Mackuen, MD  potassium chloride SA (K-DUR,KLOR-CON) 20 MEQ tablet Take 1 tablet (20 mEq total) by mouth 2 (two) times daily. Patient  taking differently: Take 20 mEq by mouth daily.  04/27/16  Yes Eulah Pont, MD  traMADol (ULTRAM) 50 MG tablet Take 1 tablet (50 mg total) by mouth every 8 (eight) hours as needed. Patient taking differently: Take 50 mg by mouth every 8 (eight) hours as needed for moderate pain.  05/28/16  Yes Arnetha Courser, MD  pantoprazole (PROTONIX) 40 MG tablet Take 1 tablet (40 mg total) by mouth daily. Patient not taking: Reported on 06/21/2016 05/15/16   Servando Snare, MD    Family History Family History    Problem Relation Age of Onset  . Adopted: Yes  . Family history unknown: Yes    Social History Social History  Substance Use Topics  . Smoking status: Current Every Day Smoker    Packs/day: 0.50    Years: 31.00    Types: Cigarettes  . Smokeless tobacco: Never Used  . Alcohol use Yes     Comment: 06/15/2016 "couple shots of liquor q 6 months"     Allergies   Morphine and Oxycodone-acetaminophen   Review of Systems Review of Systems  Constitutional: Negative for appetite change and fatigue.  HENT: Negative for congestion, ear discharge and sinus pressure.   Eyes: Negative for discharge.  Respiratory: Negative for cough.   Cardiovascular: Negative for chest pain.  Gastrointestinal: Positive for abdominal pain, nausea and vomiting. Negative for diarrhea.  Genitourinary: Negative for frequency and hematuria.  Musculoskeletal: Negative for back pain.  Skin: Negative for rash.  Neurological: Negative for seizures and headaches.  Psychiatric/Behavioral: Negative for hallucinations.     Physical Exam Updated Vital Signs BP (!) 146/106   Pulse 84   Temp 98.3 F (36.8 C) (Oral)   Resp 20   SpO2 97%   Physical Exam  Constitutional: She is oriented to person, place, and time. She appears well-developed.  HENT:  Head: Normocephalic.  Eyes: Conjunctivae and EOM are normal. No scleral icterus.  Neck: Neck supple. No thyromegaly present.  Cardiovascular: Normal rate and regular rhythm.  Exam reveals no gallop and no friction rub.   No murmur heard. Pulmonary/Chest: No stridor. She has no wheezes. She has no rales. She exhibits no tenderness.  Abdominal: She exhibits no distension. There is tenderness. There is no rebound.  Musculoskeletal: Normal range of motion. She exhibits no edema.  Lymphadenopathy:    She has no cervical adenopathy.  Neurological: She is oriented to person, place, and time. She exhibits normal muscle tone. Coordination normal.  Skin: No rash noted. No  erythema.  Psychiatric: She has a normal mood and affect. Her behavior is normal.     ED Treatments / Results  Labs (all labs ordered are listed, but only abnormal results are displayed) Labs Reviewed  LIPASE, BLOOD - Abnormal; Notable for the following:       Result Value   Lipase 560 (*)    All other components within normal limits  COMPREHENSIVE METABOLIC PANEL - Abnormal; Notable for the following:    Potassium 2.7 (*)    CO2 21 (*)    Glucose, Bld 138 (*)    BUN <5 (*)    Albumin 2.7 (*)    ALT 13 (*)    Alkaline Phosphatase 139 (*)    All other components within normal limits  CBC - Abnormal; Notable for the following:    WBC 11.9 (*)    RBC 3.46 (*)    Hemoglobin 11.3 (*)    HCT 33.6 (*)    Platelets 543 (*)  All other components within normal limits    EKG  EKG Interpretation None       Radiology No results found.  Procedures Procedures (including critical care time)  Medications Ordered in ED Medications  potassium chloride SA (K-DUR,KLOR-CON) CR tablet 40 mEq (0 mEq Oral Hold 06/21/16 2043)  potassium chloride 30 mEq in sodium chloride 0.9 % 265 mL (KCL MULTIRUN) IVPB (30 mEq Intravenous Given 06/21/16 2117)  sodium chloride 0.9 % bolus 1,000 mL (0 mLs Intravenous Stopped 06/21/16 2125)  ondansetron (ZOFRAN) injection 4 mg (4 mg Intravenous Given 06/21/16 2011)  HYDROmorphone (DILAUDID) injection 1 mg (1 mg Intravenous Given 06/21/16 2011)  HYDROmorphone (DILAUDID) injection 1 mg (1 mg Intravenous Given 06/21/16 2123)     Initial Impression / Assessment and Plan / ED Course  I have reviewed the triage vital signs and the nursing notes.  Pertinent labs & imaging results that were available during my care of the patient were reviewed by me and considered in my medical decision making (see chart for details).     Patient with pancreatitis and hypokalemia she will be admitted back to the internal medicine service.  Final Clinical Impressions(s) / ED  Diagnoses   Final diagnoses:  Hypokalemia    New Prescriptions New Prescriptions   No medications on file     Bethann Berkshire, MD 06/21/16 2149

## 2016-06-22 ENCOUNTER — Ambulatory Visit: Payer: Managed Care, Other (non HMO)

## 2016-06-22 LAB — CBC
HCT: 26.6 % — ABNORMAL LOW (ref 36.0–46.0)
Hemoglobin: 8.7 g/dL — ABNORMAL LOW (ref 12.0–15.0)
MCH: 31.7 pg (ref 26.0–34.0)
MCHC: 32.3 g/dL (ref 30.0–36.0)
MCV: 98.2 fL (ref 78.0–100.0)
PLATELETS: 383 10*3/uL (ref 150–400)
RBC: 2.71 MIL/uL — ABNORMAL LOW (ref 3.87–5.11)
RDW: 14.8 % (ref 11.5–15.5)
WBC: 6.3 10*3/uL (ref 4.0–10.5)

## 2016-06-22 LAB — BASIC METABOLIC PANEL
Anion gap: 8 (ref 5–15)
BUN: 6 mg/dL (ref 6–20)
CO2: 21 mmol/L — ABNORMAL LOW (ref 22–32)
CREATININE: 0.62 mg/dL (ref 0.44–1.00)
Calcium: 8.6 mg/dL — ABNORMAL LOW (ref 8.9–10.3)
Chloride: 111 mmol/L (ref 101–111)
GFR calc Af Amer: >60 mL/min (ref >60)
Glucose, Bld: 142 mg/dL — ABNORMAL HIGH (ref 65–99)
POTASSIUM: 3.2 mmol/L — AB (ref 3.5–5.1)
SODIUM: 140 mmol/L (ref 135–145)

## 2016-06-22 LAB — BODY FLUID CULTURE: CULTURE: NO GROWTH

## 2016-06-22 LAB — ETHANOL: Alcohol, Ethyl (B): <5 mg/dL (ref <5)

## 2016-06-22 MED ORDER — ENOXAPARIN SODIUM 40 MG/0.4ML ~~LOC~~ SOLN
40.0000 mg | SUBCUTANEOUS | Status: DC
  Filled 2016-06-22: qty 0.4

## 2016-06-22 MED ORDER — SODIUM CHLORIDE 0.9 % IV SOLN
30.0000 meq | Freq: Once | INTRAVENOUS | Status: AC
  Administered 2016-06-22: 30 meq via INTRAVENOUS
  Filled 2016-06-22 (×2): qty 15

## 2016-06-22 MED ORDER — PROMETHAZINE HCL 25 MG PO TABS
12.5000 mg | ORAL_TABLET | Freq: Once | ORAL | Status: AC
Start: 2016-06-22 — End: 2016-06-22
  Administered 2016-06-22: 12.5 mg via ORAL
  Filled 2016-06-22: qty 1

## 2016-06-22 MED ORDER — SODIUM CHLORIDE 0.9% FLUSH
3.0000 mL | Freq: Two times a day (BID) | INTRAVENOUS | Status: DC
  Administered 2016-06-22 (×2): 3 mL via INTRAVENOUS

## 2016-06-22 MED ORDER — CALCIUM CARBONATE ANTACID 500 MG PO CHEW
1.0000 | CHEWABLE_TABLET | ORAL | Status: AC
  Administered 2016-06-22: 200 mg via ORAL
  Filled 2016-06-22: qty 1

## 2016-06-22 MED ORDER — HYDROMORPHONE HCL 2 MG/ML IJ SOLN
1.0000 mg | INTRAMUSCULAR | Status: DC | PRN
  Administered 2016-06-22 (×5): 1 mg via INTRAVENOUS
  Filled 2016-06-22 (×5): qty 1

## 2016-06-22 MED ORDER — BOOST / RESOURCE BREEZE PO LIQD: 1.0000 | Freq: Two times a day (BID) | ORAL | Status: DC

## 2016-06-22 MED ORDER — PANCRELIPASE (LIP-PROT-AMYL) 12000-38000 UNITS PO CPEP
48000.0000 [IU] | ORAL_CAPSULE | Freq: Three times a day (TID) | ORAL | Status: DC
  Administered 2016-06-22 (×3): 48000 [IU] via ORAL
  Filled 2016-06-22 (×3): qty 4

## 2016-06-22 MED ORDER — IBUPROFEN 400 MG PO TABS: 400.0000 mg | ORAL_TABLET | Freq: Four times a day (QID) | ORAL | Status: DC | PRN

## 2016-06-22 MED ORDER — ALBUTEROL SULFATE (2.5 MG/3ML) 0.083% IN NEBU: 3.0000 mL | INHALATION_SOLUTION | RESPIRATORY_TRACT | Status: DC | PRN

## 2016-06-22 MED ORDER — CALCIUM CARBONATE ANTACID 500 MG PO CHEW: 1.0000 | CHEWABLE_TABLET | ORAL | 0 refills | Status: AC

## 2016-06-22 NOTE — Progress Notes (Signed)
Lab called to clarify the drug test ordered for pt. , as the lab reported the one ordered was out of facility. The lab wanted to know if the MD wanted to have the in-facility test or otherwise. Resident MD was paged. Resident MD reports it would be looked into during the day. Will continue to monitor.

## 2016-06-22 NOTE — Progress Notes (Signed)
[facility]. I called report to Germaine PomfretMary Jane RN at [phone]. [facility] has been scheduled and report has been given. [facility] scheduled to pick up patient at 7:30.

## 2016-06-22 NOTE — Progress Notes (Signed)
Subjective: Sharon Kent was seen and evaluated today at bedside. N/V/abdominal pain improved however still present. Reports she had cheese and crackers the evening of discharge and woke up the next morning with unbearable abdominal pian as well as nausea and vomiting which did not respond to PO medications. She requests being seen by GI again. Desires diet.   Objective:  Vital signs in last 24 hours: Vitals:   06/22/16 0000 06/22/16 0041 06/22/16 0200 06/22/16 0533  BP: 129/99 134/79  124/80  Pulse: 70 (!) 32  60  Resp: 12 18  18   Temp:  97.5 F (36.4 C)  97.8 F (36.6 C)  TempSrc:  Oral    SpO2: 94% 97%  93%  Weight:   169 lb 4.8 oz (76.8 kg)   Height:   5\' 6"  (1.676 m)    General: Chronically-ill appearing caucasian woman, in no acute distress however appears fatigued. HENT: No conjunctival injection, icterus or ptosis.  Cardiovascular: Regular rate and rhythm. No murmur or rub. Pulmonary: CTAB, normal WOB.  Abdomen: Diffusely tender to palpation, especially RUQ and LUQ.  Extremities: Left elbow with significant improvement of swelling.  Psych: Mood normal. Pleasant and open to exam. Frustrated over quick return of symptoms.   MR abdomen 06/16/16: Severely motion degraded. Progressive irregular pancreatic ductal dilation (14 mm) with central filling defect. 5 x 4 x 2.8 cm loculated fluid collection adjacent to left mid abdominal wall. Dilated common bile duct (12 mm). Peripancreatic inflammatory changes consistent with acute on chronic pancreatitis. ERCP recommended when pt clinically improved.  CT abdomen/pelvis 05/27/16: Acute on chronic calcific pancreatitis. Fluid collection adjacent to spleen, could not exclude developing pseudocyst measuring 5.1 x 2.4 x 4.1 cm.   Assessment/Plan:This is a 56 year old female with Mhx significant for chronic alcoholic pancreatitis with exacerbation x 1 month. CT and MR show progressive pancreatic duct dilation. GI consulted who rec outpatient  follow up with tertiary center. At time of her discharge 3/13, she was tolerating a regular diet without abdominal pain. For some reason, the morning after discharge she again developed severe abdominal pain, nausea and vomiting.   Acute on Chronic Pancreatitis with Dilation of Pancreatic Duct, Dilated CBD, Elevated AFP, ?loculated fluid collection: Lipase 560, lipase normal prior to discharge. She denies any alcohol use however ethanol level negative (~12 hrs after admission though). NPO except ice chips. IVF @150  mL/hr x 24 hrs. Pain control with IV dilaudid. Have consulted GI for second opinion. She would benefit from ERCP however unclear if [facility] can perform this? I do not believe patients symptoms will improve without intervention and I am concerned for pancreatic duct stone vs malignancy.  -Greatly appreciate GI's expertise in management of this patient. Will likely need transfer to tertiary care center. [facility]? -NPO except ice chips -IVF @ 134mL/hr -Pain control with IV dilaudid -Zofran for nausea -Creon 48,000 units TID  Hypokalemia: On admission 2.7, corrected to 3.2 after replacement. Mg low at 1.5. This has been replaced via IV mag sulfate 2g. She was also given another 30 mEq IV potassium.  -Reassess BMET in AM  Elevated AFP, ?Hepatic Mass: Motion degraded MRI did not confirm presents of mass however of course was not an ideal study. Consider repeat imaging.   Left elbow pain, large elbow effusion, CPPD: Swelling has improved since intraarticular steroid injection.   History of Asthma: Not in acute exacerbation. Albuterol PRN   Consult: GI Dispo: Anticipated discharge unclear. Consider intervention here vs tertiary care center.  Dabney Dever, D.O. Internal Medicine, PGY-1 Pager: [phone]

## 2016-06-22 NOTE — Progress Notes (Signed)
Sharon MealingVictoria Kimbrough [facility] MD order.  Discussed with the patient and all questions fully answered.  VVS, Skin clean, dry and intact without evidence of skin break down, no evidence of skin tears noted. IV to Right AC - NSL.   [facility] Link packet, EMTALA, facesheet, and medical necessity form completed and sent with patient.  Patient left unit via [facility] at 7:48pm.   [name], [name] D 06/22/2016 7:48 PM

## 2016-06-22 NOTE — Progress Notes (Signed)
Initial Nutrition Assessment  DOCUMENTATION CODES:   Not applicable  INTERVENTION:   -Boost Breeze po BID, each supplement provides 250 kcal and 9 grams of protein  NUTRITION DIAGNOSIS:   Increased nutrient needs related to chronic illness (pancreatitis) as evidenced by estimated needs.  GOAL:   Patient will meet greater than or equal to 90% of their needs  MONITOR:   PO intake  REASON FOR ASSESSMENT:   Malnutrition Screening Tool    ASSESSMENT:   Sharon MealingVictoria Kent is a 56 y.o. female,with past medical history of alcohol abuse complicated by chronic pancreatitis and cirrhosis, with multiple admissions due to acute on chronic pancreatitis, recently discharged yesterday, came back again with complaint of nausea, vomiting and abdominal pain.  Pt admitted with acute on chronic pancreatitis.   Pt sleeping soundly at time of visit. RD did not disturb.   Pt familiar to this RD from previous admission. Noted pt was discharged home on 06/21/16, however, returned to ED last night after experiencing diffuse abdominal pain after eating cheese and crackers. Per MD notes, pt tolerating regular diet well prior to discharge. Noted meal completion 100%.  Per previous RD note, pt has been suffering from a general decline in health over the past 2-3 months, due to lack of appetite and difficulty keeping foods down. 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   Per wt hx, pt has experienced a 6.1% wt loss over the past 2 months.   Nutrition-focused physical exam on 06/16/16 revealed mild fat depletion, mild muscle depletion, and no edema. Noted fat and muscle depletion in upper and lower extremities only and may be partially related to dehydration. RD unable to identify malnutrition at this time, however, pt remains at high risk given ongoing wt loss, poor po intake, and pancreatitis.   Pt was just advanced to a full liquid diet. Will add Boost Breeze supplement for  additional calorie and protein intake.   Per GI notes, considering transfer tertiary care facility.   Labs reviewed: K: 3.2 (on IV supplementation), lipase: 560.   Diet Order:  Diet full liquid Room service appropriate? Yes; Fluid consistency: Thin  Skin:  Reviewed, no issues  Last BM:  06/21/16  Height:   Ht Readings from Last 1 Encounters:  06/22/16 5\' 6"  (1.676 m)    Weight:   Wt Readings from Last 1 Encounters:  06/22/16 169 lb 4.8 oz (76.8 kg)    Ideal Body Weight:  59.1 kg  BMI:  Body mass index is 27.33 kg/m.  Estimated Nutritional Needs:   Kcal:  1900-2100  Protein:  100-115 grams  Fluid:  1.9-2.1 L  EDUCATION NEEDS:   No education needs identified at this time  Ladarrian Asencio A. Mayford KnifeWilliams, RD, LDN, CDE Pager: [phone] After hours Pager: [phone]

## 2016-06-22 NOTE — Discharge Summary (Signed)
Name: Sharon Kent MRN: [id number] DOB: July 09, 1960 56 y.o. PCP: Gust Rung, DO  Date of Admission: 06/21/2016  7:21 PM Date of Discharge: 06/22/2016 Attending Physician: Gust Rung, DO  Discharge Diagnosis: 1. Acute on Chronic Pancreatitis  2. Pancreatic duct dilation 3. Hepatic mass with elevated AFP 4. Hypokalemia   Principal Problem:   Acute on chronic pancreatitis (HCC) Active Problems:   Asthma   Hypokalemia   History of hypertension   Calcium pyrophosphate deposition disease   Acute pancreatitis  Discharge Medications: Allergies as of 06/22/2016      Reactions   Morphine Nausea Only   Oxycodone-acetaminophen Other (See Comments)   Makes her "loopy."      Medication List    TAKE these medications   Albuterol Sulfate 108 (90 Base) MCG/ACT Aepb Inhale 2 puffs into the lungs as needed. What changed:  when to take this  reasons to take this   calcium carbonate 500 MG chewable tablet Commonly known as:  TUMS - dosed in mg elemental calcium Chew 1 tablet (200 mg of elemental calcium total) by mouth now.   ibuprofen 200 MG tablet Commonly known as:  ADVIL,MOTRIN Take 400 mg by mouth every 6 (six) hours as needed for mild pain.   multivitamin with minerals tablet Take 1 tablet by mouth daily.   ondansetron 4 MG disintegrating tablet Commonly known as:  ZOFRAN ODT Take 1 tablet (4 mg total) by mouth every 8 (eight) hours as needed for nausea or vomiting.   Pancrelipase (Lip-Prot-Amyl) 24000-76000 units Cpep Take 2 capsules (48,000 Units total) by mouth 3 (three) times daily with meals.   pantoprazole 40 MG tablet Commonly known as:  PROTONIX Take 1 tablet (40 mg total) by mouth daily.   potassium chloride SA 20 MEQ tablet Commonly known as:  K-DUR,KLOR-CON Take 1 tablet (20 mEq total) by mouth 2 (two) times daily. What changed:  when to take this   traMADol 50 MG tablet Commonly known as:  ULTRAM Take 1 tablet (50 mg total) by mouth every  8 (eight) hours as needed. What changed:  reasons to take this      Disposition and follow-up:   [name] was discharged from [facility] in stable condition.  At the hospital follow up visit please address:  1.  Recurrent Acute on Chronic Pancreatitis in setting of pancreatic duct dilation: She will require work-up at tertiary care center and likely intervention to decrease recurrence.   Hepatic mass?, Elevated AFP: Would be beneficial to obtain repeat MRI w/wo contrast to evaluate this. Unfortunately, the initial was severely motion degraded.   2.  Labs / imaging needed at time of follow-up: CMET, CBC, Lipase, abdominal imaging.   3.  Pending labs/ test needing follow-up: none.  Hospital Course by problem list: Principal Problem:   Acute on chronic pancreatitis [facility]) Active Problems:   Asthma   Hypokalemia   Hepatic lesion   History of hypertension   Dilation of pancreatic duct   Calcium pyrophosphate deposition disease   Acute pancreatitis   1. Acute on Chronic Alcohol-Related Pancreatitis, Pancreatic Duct Dilation [name] is 56 y.o F admitted 06/21/16 for the 3rd time over the past month with an exacerbation of her acute on chronic alcohol-associated pancreatitis. She does not currently drink. During the past month, she was also seen in the ED 4 times with the same presentation. CT abdomen/pelvis during 1st admission 2/17-2/18 showed acute on chronic calcific pancreatitis, liver cirrhosis and a suspected  right lobe hepatic mass measuring at least 3.4 cm. She was admitted on our service (06/15/16-06/20/16) at which time MRI abdomen was obtained which showed progressive dilation of the pancreatic duct (14 mm) with intraductal filling defect. No mass was appreciated however the MRI was severely motion degraded. GI was consulted who recommended outpatient follow-up at a tertiary care center for advanced pancreatic interventions that are not performed  at this facility out of concern for stone vs mass. At time of discharge, she was tolerating a regular diet. The evening of 3/13, she ate cheese and crackers for dinner after returning home from the hospital. She awoke the next morning with return of above symptoms. She has again been seen by GI who recommend transfer to tertiary care center for advanced intervention.   2. Hepatic mass, Elevated AFP Suspected on CT abdomen/pelvis obtained 05/27/16. This was not appreciated on MRI 06/16/16 however this MRI was severely motion degraded. AFP 05/28/2016 = 11.7  3. Hypokalemia Potassium 2.7 on admission. This was replaced with both IV and PO potassium. Magnesium was 1.5 and was subsequently replaced with IV 2g.   Discharge Vitals:   BP 124/80 (BP Location: Left Arm)   Pulse 60   Temp 97.8 F (36.6 C)   Resp 18   Ht 5\' 6"  (1.676 m)   Wt 169 lb 4.8 oz (76.8 kg)   SpO2 93%   BMI 27.33 kg/m   Pertinent Labs, Studies, and Procedures:   CT abdomen/pelvis 05/27/16: Probable hepatic cirrhosis. Surgically absent gallbladder. Poorly defined, approximately 3.4 cm hepatic mass could not be excluded. Scarred calcifications within the pancreas consistent with chronic pancreatitis. Superimposed acute pancreatitis with significant peri-pancreatic edema extending to liver, duodenum, stomach and spleen. Small amount of perihepatic and perisplenic free fluid adjacent to lateral margin of spleen. ?Loculation. Measuring 5.1 x 2.4 x 4 cm.   MRI abdomen w/wo contrast 06/16/16: Irregular dilatation of the pancreatic duct, measuring up to 14 mm which has progressed. Central filling defect. Irregular dilation of CBD measuring 12 mm. Inflammatory changes about the pancreas suggesting acute on chronic pancreatitis. No adjacent pseudocyst however a 5.4 x 2.8 cm loculated fluid collection   AFP 11.7 (05/28/2016)  Ethanol level 06/22/16: <5  Signed: Philana Younis, DO 06/22/2016, 12:08 PM   Pager: [phone]

## 2016-06-22 NOTE — Progress Notes (Signed)
Pt arrived floor. Vitals signs stable. Cardiac telemetry initiated. MD notified. Will continue to monitor.

## 2016-06-22 NOTE — Progress Notes (Signed)
[facility] Progress Note  Sharon MealingVictoria Kent 56 y.o. [birth year]  CC:  Abdominal pain, nausea and vomiting   Subjective:  This is a 56 year old patient who was discharged on 06/20/2016 after being treated for acute on chronic pancreatitis. Patient came back within 24 hours with the nausea vomiting and worsening abdominal pain. Patient with history of chronic pancreatitis since last 15 years. Has been on Creon since last 8 years. An extensive workup during last admission including CT scan and MRI which showed possible pseudocyst as well as pancreatic ductal dilatation with 14 mm of PD along with filling defect in PD. Patient's abdominal pain was resolved and she was discharged home.  Patient is complaining of constipation but had a bowel movement yesterday. Denied any blood in the stool or black stool. Denied any vomiting of blood. Denied any dysphagia or odynophagia.  ROS : Positive for left elbow pain. Negative for nausea, vomiting   Objective: Vital signs in last 24 hours: Vitals:   06/22/16 0041 06/22/16 0533  BP: 134/79 124/80  Pulse: (!) 32 60  Resp: 18 18  Temp: 97.5 F (36.4 C) 97.8 F (36.6 C)    Physical Exam:  General:  Alert, cooperative, Not in acute distress appears stated age  Head:  Normocephalic, without obvious abnormality, atraumatic  Eyes:  , EOM's intact,   Lungs:   Clear to auscultation bilaterally, respirations unlabored  Heart:  Regular rate and rhythm, S1, S2 normal  Abdomen:   Mild epigastric discomfort, soft, nondistended, bowel sounds present.   Extremities:  tenderness noted over left elbow with subcutaneous edema   Pulses:     Lab Results:  Recent Labs  06/21/16 1924 06/21/16 1937 06/22/16 0423  NA  --  139 140  K  --  2.7* 3.2*  CL  --  107 111  CO2  --  21* 21*  GLUCOSE  --  138* 142*  BUN  --  <5* 6  CREATININE  --  0.71 0.62  CALCIUM  --  9.4 8.6*  MG 1.6*  --   --     Recent Labs  06/21/16 1937  AST 19  ALT 13*   ALKPHOS 139*  BILITOT 0.7  PROT 7.6  ALBUMIN 2.7*    Recent Labs  06/21/16 1937 06/22/16 0423  WBC 11.9* 6.3  HGB 11.3* 8.7*  HCT 33.6* 26.6*  MCV 97.1 98.2  PLT 543* 383   No results for input(s): LABPROT, INR in the last 72 hours.    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 - Abdominal pain.Probably from chronic pancreatitis - Cirrhosis per CT scan. Recommend outpatient workup - 3.4 cm right lobe lesion in the liver. Cannot exclude mass. Mild elevated AFP at 11.7 -  Possible developing pseudocyst or CT scan.  - Possible left elbow cellulitis. - Anemia. No overt bleeding.  Recommendations ---------------------- -  Continue Creon. Pain management per primary team. Patient with no leukocytosis. LFTs stable. - If continues to have pain, we'll get repeat CT scan tomorrow - Patient needs to be managed at  tertiary center such [facility] for further intervention for pancreatic duct stones for advance pancreatic/biliary intervention . [facility] transfer services has been made aware of the patient's need for transfer for further management.   - GI will follow   - Kathi DerParag Shalon Salado MD, FACP 06/22/2016, 1:44 PM  Pager [phone]  If no answer or after 5 PM call [phone]

## 2016-06-23 ENCOUNTER — Ambulatory Visit (HOSPITAL_COMMUNITY): Admission: RE | Admit: 2016-06-23 | Payer: Managed Care, Other (non HMO) | Source: Ambulatory Visit

## 2016-06-24 LAB — CULTURE, BLOOD (ROUTINE X 2)
Culture: NO GROWTH
Culture: NO GROWTH

## 2016-06-26 LAB — URINE DRUGS OF ABUSE SCREEN W ALC, ROUTINE (REF LAB)
Amphetamines, Urine: NEGATIVE ng/mL
BARBITURATE, UR: NEGATIVE ng/mL
BENZODIAZEPINE QUANT UR: NEGATIVE ng/mL
Cannabinoid Quant, Ur: NEGATIVE ng/mL
Cocaine (Metab.): NEGATIVE ng/mL
Ethanol U, Quan: NEGATIVE %
Methadone Screen, Urine: NEGATIVE ng/mL
Phencyclidine, Ur: NEGATIVE ng/mL
Propoxyphene, Urine: NEGATIVE ng/mL

## 2016-06-26 LAB — OPIATES CONFIRMATION, URINE: OPIATES: NEGATIVE

## 2016-07-20 ENCOUNTER — Telehealth: Payer: Self-pay | Admitting: Internal Medicine

## 2016-07-20 NOTE — Telephone Encounter (Signed)
Calling to confirm appointment for 07/21/16 at 1:45lmtcb

## 2016-07-21 ENCOUNTER — Ambulatory Visit (INDEPENDENT_AMBULATORY_CARE_PROVIDER_SITE_OTHER): Payer: Managed Care, Other (non HMO) | Admitting: Internal Medicine

## 2016-07-21 VITALS — BP 128/80 | HR 71 | Temp 97.8°F | Ht 66.0 in | Wt 155.9 lb

## 2016-07-21 DIAGNOSIS — K861 Other chronic pancreatitis: Secondary | ICD-10-CM | POA: Diagnosis not present

## 2016-07-21 DIAGNOSIS — F1021 Alcohol dependence, in remission: Secondary | ICD-10-CM | POA: Diagnosis not present

## 2016-07-21 DIAGNOSIS — Z9689 Presence of other specified functional implants: Secondary | ICD-10-CM | POA: Diagnosis not present

## 2016-07-21 DIAGNOSIS — Z5189 Encounter for other specified aftercare: Secondary | ICD-10-CM

## 2016-07-21 DIAGNOSIS — I10 Essential (primary) hypertension: Secondary | ICD-10-CM

## 2016-07-21 DIAGNOSIS — E876 Hypokalemia: Secondary | ICD-10-CM

## 2016-07-21 MED ORDER — PANTOPRAZOLE SODIUM 40 MG PO TBEC: 40.0000 mg | DELAYED_RELEASE_TABLET | Freq: Every day | ORAL | 0 refills | Status: DC

## 2016-07-21 MED ORDER — FOLIC ACID 1 MG PO TABS: 1.0000 mg | ORAL_TABLET | Freq: Every day | ORAL | 0 refills | Status: DC

## 2016-07-21 MED ORDER — TRAMADOL HCL 50 MG PO TABS: 50.0000 mg | ORAL_TABLET | Freq: Three times a day (TID) | ORAL | 0 refills | Status: DC | PRN

## 2016-07-21 MED ORDER — POTASSIUM CHLORIDE CRYS ER 20 MEQ PO TBCR: 40.0000 meq | EXTENDED_RELEASE_TABLET | Freq: Every day | ORAL | 0 refills | Status: DC

## 2016-07-21 NOTE — Progress Notes (Signed)
   CC: Hospital follow up for acute on chronic pancreatitis   HPI:  [name] is a 56 y.o. woman with PMHx as noted below who presents today for hospital follow up of her pancreatitis.   Chronic Pancreatitis: Patient was hospitalized from 3/14-3/15 for an exacerbation of acute on chronic pancreatitis. This was her 3rd admission for the month for the same symptoms of abdominal pain and nausea with vomiting. Her pancreatitis was thought to be alcohol-induced but patient reported abstinence from alcohol. She had an MRI abdomen obtained during her second admission which showed progressive dilation of the pancreatic duct with intraductal filling defect. During her most recent admission GI was consulted and recommended transfer to a tertiary center for ERCP with EUS. 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 A sphincterotomy was performed and 2 stents were placed. Today, patient reports she is feeling much better. She still has occasional epigastric abdominal pain but this is well controlled with 1/2 tablet of Tramadol 50 mg daily. She has been taking her Creon with meals. She reports having heartburn like symptoms when off of her Protonix. She is requesting a refill of this today.  HTN: She was started on Amlodipine at [facility] for hypertension. She has been taking Amlodipine 2.5 mg daily.   Hypokalemia: K noted to be low during her hospitalization. She was discharged on K-Dur 40 mEq daily by [facility].  Past Medical History:  Diagnosis Date  . Asthma   . Family history of adverse reaction to anesthesia    "don't think my son ever had anesthesia"  . GERD (gastroesophageal reflux disease)   . H/O chronic pancreatitis   . H/O ETOH abuse   . Headache    "monthly" (06/15/2016)  . History of blood transfusion 2000s   "when they explored my abdomen"  . Hx of pulmonary embolus   . Pancreatic abscess   .  Pneumonia    "twice at least" (06/15/2016)    Review of Systems:   General: Denies fever, chills, night sweats, changes in weight, changes in appetite HEENT: Denies headaches, ear pain, changes in vision, rhinorrhea, sore throat CV: Denies CP, palpitations, SOB, orthopnea Pulm: Denies SOB, cough, wheezing GI: Denies diarrhea, constipation, melena, hematochezia GU: Denies dysuria, hematuria, frequency Msk: Denies muscle cramps, joint pains Neuro: Denies weakness, numbness, tingling Skin: Denies rashes, bruising Psych: Denies depression, anxiety, hallucinations  Physical Exam:  Vitals:   07/21/16 1347  BP: 128/80  Pulse: 71  Temp: 97.8 F (36.6 C)  TempSrc: Oral  SpO2: 98%  Weight: 155 lb 14.4 oz (70.7 kg)  Height:  (1.676 m)   General: Well-nourished woman in NAD CV: RRR, no m/g/r Pulm: CTA bilaterally, breaths non-labored Abd: BS+, soft, mild tenderness in epigastrium, non-distended  Assessment & Plan:   See Encounters Tab for problem based charting.  Patient discussed with Dr. Cleda Daub

## 2016-07-21 NOTE — Patient Instructions (Signed)
General Instructions: - Refills made on your medications - We will get blood work today - Please make it to your appointment with your primary care doctor, Dr. Mikey Bussing, on 5/31  Thank you for bringing your medicines today. This helps [name] keep you safe from mistakes.   Progress Toward Treatment Goals:  No flowsheet data found.  Self Care Goals & Plans:  No flowsheet data found.  No flowsheet data found.   Care Management & Community Referrals:  No flowsheet data found.

## 2016-07-22 LAB — CBC
HEMATOCRIT: 39.2 % (ref 34.0–46.6)
HEMOGLOBIN: 13 g/dL (ref 11.1–15.9)
MCH: 31.9 pg (ref 26.6–33.0)
MCHC: 33.2 g/dL (ref 31.5–35.7)
MCV: 96 fL (ref 79–97)
Platelets: 311 10*3/uL (ref 150–379)
RBC: 4.08 x10E6/uL (ref 3.77–5.28)
RDW: 15.8 % — ABNORMAL HIGH (ref 12.3–15.4)
WBC: 7.8 10*3/uL (ref 3.4–10.8)

## 2016-07-22 LAB — CMP14 + ANION GAP
ALT: 11 IU/L (ref 0–32)
AST: 14 IU/L (ref 0–40)
Albumin/Globulin Ratio: 1.1 — ABNORMAL LOW (ref 1.2–2.2)
Albumin: 3.4 g/dL — ABNORMAL LOW (ref 3.5–5.5)
Alkaline Phosphatase: 237 IU/L — ABNORMAL HIGH (ref 39–117)
Anion Gap: 19 mmol/L — ABNORMAL HIGH (ref 10.0–18.0)
BUN/Creatinine Ratio: 5 — ABNORMAL LOW (ref 9–23)
BUN: 4 mg/dL — AB (ref 6–24)
Bilirubin Total: 0.3 mg/dL (ref 0.0–1.2)
CALCIUM: 9 mg/dL (ref 8.7–10.2)
CHLORIDE: 98 mmol/L (ref 96–106)
CO2: 19 mmol/L (ref 18–29)
Creatinine, Ser: 0.84 mg/dL (ref 0.57–1.00)
GFR, EST AFRICAN AMERICAN: 90 mL/min/{1.73_m2} (ref >59)
GFR, EST NON AFRICAN AMERICAN: 78 mL/min/{1.73_m2} (ref >59)
GLUCOSE: 105 mg/dL — AB (ref 65–99)
Globulin, Total: 3.1 g/dL (ref 1.5–4.5)
Potassium: 3.5 mmol/L (ref 3.5–5.2)
Sodium: 136 mmol/L (ref 134–144)
TOTAL PROTEIN: 6.5 g/dL (ref 6.0–8.5)

## 2016-07-23 DIAGNOSIS — I10 Essential (primary) hypertension: Secondary | ICD-10-CM | POA: Insufficient documentation

## 2016-07-23 NOTE — Assessment & Plan Note (Signed)
Patient with a complicated pancreatic history. She was found to have pancreatic divisum on ERCP on 06/26/16 at [facility]. She had 2 stents placed and is now symptomatically feeling much better. Refilled her Tramadol today for #30 tablets. She likely will not need long-term opiate therapy as her pain is not severe and should get better s/p stent placement. She will follow up with GI at [facility] in 4 months for stent exchange. Advised to continue Creon with meals and abstain from alcohol.

## 2016-07-23 NOTE — Assessment & Plan Note (Signed)
Continue K-Dur 40 mEq daily. Will check bmet today.  Update: bmet shows K stable at 3.5. Will have her continue K-Dur 40 mEq and this will need to be checked again at her next visit.

## 2016-07-23 NOTE — Assessment & Plan Note (Signed)
Started on Amlodipine during her hospitalization as [facility]. She likely does not need this medication as her hypertension was probably secondary to pain. BPs in the past have been normotensive when not having pancreatitis flare ups. Will continue Amlodipine 2.5 mg daily for now and will allow PCP decision to stop or not.

## 2016-07-25 NOTE — Progress Notes (Signed)
Internal Medicine Clinic Attending  Case discussed with Dr. Beckie Salts at the time of the visit.  We reviewed the resident's history and exam and pertinent patient test results.  I agree with the assessment, diagnosis, and plan of care documented in the resident's note. We have reviewed her records from [facility] and I agree with Dr Rivet's documentation of that hospitalization.  It appears she was mildly hypertensive there and started on amlodipine.  In the last several months here she has had mild intermittent hypertension recorded but this was in the setting of multiple episdoes of pancreatitis.  I am not sure she truly has HTN, OK to continue such a low dose of amlodipine for now but will likely need trial off amlodipine in the future.  For her abdominal pain, I hope that she does not continue to need opiate therapy however she does have evidence of chronic pancreatitis and this may be needed in addition to creon.

## 2016-08-23 ENCOUNTER — Ambulatory Visit (INDEPENDENT_AMBULATORY_CARE_PROVIDER_SITE_OTHER): Payer: Managed Care, Other (non HMO) | Admitting: Internal Medicine

## 2016-08-23 ENCOUNTER — Telehealth: Payer: Self-pay | Admitting: *Deleted

## 2016-08-23 ENCOUNTER — Encounter: Payer: Self-pay | Admitting: Internal Medicine

## 2016-08-23 VITALS — BP 110/76 | HR 93 | Temp 97.8°F | Ht 66.0 in | Wt 149.3 lb

## 2016-08-23 DIAGNOSIS — I10 Essential (primary) hypertension: Secondary | ICD-10-CM

## 2016-08-23 DIAGNOSIS — K649 Unspecified hemorrhoids: Secondary | ICD-10-CM | POA: Diagnosis not present

## 2016-08-23 DIAGNOSIS — Z9689 Presence of other specified functional implants: Secondary | ICD-10-CM

## 2016-08-23 DIAGNOSIS — K861 Other chronic pancreatitis: Secondary | ICD-10-CM | POA: Diagnosis not present

## 2016-08-23 DIAGNOSIS — K644 Residual hemorrhoidal skin tags: Secondary | ICD-10-CM | POA: Insufficient documentation

## 2016-08-23 DIAGNOSIS — R195 Other fecal abnormalities: Secondary | ICD-10-CM | POA: Diagnosis not present

## 2016-08-23 DIAGNOSIS — K921 Melena: Secondary | ICD-10-CM | POA: Insufficient documentation

## 2016-08-23 MED ORDER — HYDROCORTISONE ACETATE 25 MG RE SUPP: 25.0000 mg | Freq: Two times a day (BID) | RECTAL | 1 refills | Status: DC

## 2016-08-23 MED ORDER — HYDROCORTISONE 2.5 % RE CREA: TOPICAL_CREAM | RECTAL | 1 refills | Status: DC

## 2016-08-23 MED ORDER — ONDANSETRON 4 MG PO TBDP: 4.0000 mg | ORAL_TABLET | Freq: Three times a day (TID) | ORAL | 0 refills | Status: DC | PRN

## 2016-08-23 MED ORDER — AMLODIPINE BESYLATE 2.5 MG PO TABS: 2.5000 mg | ORAL_TABLET | Freq: Every day | ORAL | 11 refills | Status: DC

## 2016-08-23 MED ORDER — TRAMADOL HCL 50 MG PO TABS: 50.0000 mg | ORAL_TABLET | Freq: Three times a day (TID) | ORAL | 0 refills | Status: DC | PRN

## 2016-08-23 NOTE — Assessment & Plan Note (Signed)
The patient has a very complicated pancreatic history which led to chronic pancreatitis. She recently had stents placed an ERCP and [facility] which has led to some improvement of her symptoms. However, today she says she is having increasing pain and nausea. She is requesting that her tramadol and Zofran be refilled. I will refill these medications today. She has follow-up with GI and [facility] scheduled. I stressed the importance of ensuring she makes it to this appointment as her pancreatic history is complicated and they would be the best specialists to handle this issue going forward. -- Refill tramadol -- Refill Zofran -- Follow-up with [facility]

## 2016-08-23 NOTE — Assessment & Plan Note (Signed)
The patient presents with a three-day history of blood in her stool. She says she has a remote history of hemorrhoids that she has had for approximately 20 years but these have not been bothering her. She first noticed some blood with wiping on the toilet tissue. This is worsened over the last 72 hours and she noticed blood in her underwear today. She said the volume of blood today was concerning prompting her to come to the doctor. She is not seen blood in the bowl. No melena. No constipation or diarrhea.

## 2016-08-23 NOTE — Progress Notes (Signed)
   CC: Blood in stool HPI: [name] is a 56 y.o. female with a past medical history as listed below who presents with Blood in her stool for 3 days.  Past Medical History:  Diagnosis Date  . Asthma   . Family history of adverse reaction to anesthesia    "don't think my son ever had anesthesia"  . GERD (gastroesophageal reflux disease)   . H/O chronic pancreatitis   . H/O ETOH abuse   . Headache    "monthly" (06/15/2016)  . History of blood transfusion 2000s   "when they explored my abdomen"  . Hx of pulmonary embolus   . Pancreatic abscess   . Pneumonia    "twice at least" (06/15/2016)     Review of Systems: Endorses weight loss, back pain, and night sweats. Also endorses chronic n/v and abdominal pain.  Physical Exam: Vitals:   08/23/16 1435  BP: 110/76  Pulse: 93  Temp: 97.8 F (36.6 C)  TempSrc: Oral  SpO2: 99%  Weight: 149 lb 4.8 oz (67.7 kg)  Height: 5\' 6"  (1.676 m)   General appearance: alert and cooperative Head: Normocephalic, without obvious abnormality, atraumatic Lungs: clear to auscultation bilaterally Heart: regular rate and rhythm, S1, S2 normal, no murmur, click, rub or gallop Abdomen: soft, non-tender; bowel sounds normal; no masses,  no organomegaly Rectal exam reveals multiple external hemorrhoids. The perirectal area is erythematous and appears irritated. No active bleeding appreciated.  Assessment & Plan:  See encounters tab for problem based medical decision making. Patient discussed with Dr. Criselda PeachesMullen  Signed: Thomasene Lotaylor, Jamicheal Heard, MD 08/23/2016, 2:51 PM  Pager: [phone]

## 2016-08-23 NOTE — Patient Instructions (Signed)
It was a pleasure seeing you today. [facility].  I have prescribed a suppository steroid. Please use 1 twice daily for 1 week. If your symptoms do not improve or the bleeding worsens please call the clinic during clinic hours or go to the emergency department.   Hemorrhoids Hemorrhoids are swollen veins in and around the rectum or anus. There are two types of hemorrhoids:  Internal hemorrhoids. These occur in the veins that are just inside the rectum. They may poke through to the outside and become irritated and painful.  External hemorrhoids. These occur in the veins that are outside of the anus and can be felt as a painful swelling or hard lump near the anus. Most hemorrhoids do not cause serious problems, and they can be managed with home treatments such as diet and lifestyle changes. If home treatments do not help your symptoms, procedures can be done to shrink or remove the hemorrhoids. What are the causes? This condition is caused by increased pressure in the anal area. This pressure may result from various things, including:  Constipation.  Straining to have a bowel movement.  Diarrhea.  Pregnancy.  Obesity.  Sitting for long periods of time.  Heavy lifting or other activity that causes you to strain.  Anal sex. What are the signs or symptoms? Symptoms of this condition include:  Pain.  Anal itching or irritation.  Rectal bleeding.  Leakage of stool (feces).  Anal swelling.  One or more lumps around the anus. How is this diagnosed? This condition can often be diagnosed through a visual exam. Other exams or tests may also be done, such as:  Examination of the rectal area with a gloved hand (digital rectal exam).  Examination of the anal canal using a small tube (anoscope).  A blood test, if you have lost a significant amount of blood.  A test to look inside the colon (sigmoidoscopy or colonoscopy). How is  this treated? This condition can usually be treated at home. However, various procedures may be done if dietary changes, lifestyle changes, and other home treatments do not help your symptoms. These procedures can help make the hemorrhoids smaller or remove them completely. Some of these procedures involve surgery, and others do not. Common procedures include:  Rubber band ligation. Rubber bands are placed at the base of the hemorrhoids to cut off the blood supply to them.  Sclerotherapy. Medicine is injected into the hemorrhoids to shrink them.  Infrared coagulation. A type of light energy is used to get rid of the hemorrhoids.  Hemorrhoidectomy surgery. The hemorrhoids are surgically removed, and the veins that supply them are tied off.  Stapled hemorrhoidopexy surgery. A circular stapling device is used to remove the hemorrhoids and use staples to cut off the blood supply to them. Follow these instructions at home: Eating and drinking   Eat foods that have a lot of fiber in them, such as whole grains, beans, nuts, fruits, and vegetables. Ask your health care provider about taking products that have added fiber (fiber supplements).  Drink enough fluid to keep your urine clear or pale yellow. Managing pain and swelling   Take warm sitz baths for 20 minutes, 3-4 times a day to ease pain and discomfort.  If directed, apply ice to the affected area. Using ice packs between sitz baths may be helpful.  Put ice in a plastic bag.  Place a towel between your skin and the bag.  Leave the  ice on for 20 minutes, 2-3 times a day. General instructions   Take over-the-counter and prescription medicines only as told by your health care provider.  Use medicated creams or suppositories as told.  Exercise regularly.  Go to the bathroom when you have the urge to have a bowel movement. Do not wait.  Avoid straining to have bowel movements.  Keep the anal area dry and clean. Use wet toilet paper  or moist towelettes after a bowel movement.  Do not sit on the toilet for long periods of time. This increases blood pooling and pain. Contact a health care provider if:  You have increasing pain and swelling that are not controlled by treatment or medicine.  You have uncontrolled bleeding.  You have difficulty having a bowel movement, or you are unable to have a bowel movement.  You have pain or inflammation outside the area of the hemorrhoids. This information is not intended to replace advice given to you by your health care provider. Make sure you discuss any questions you have with your health care provider. Document Released: 03/24/2000 Document Revised: 08/25/2015 Document Reviewed: 12/09/2014 Elsevier Interactive Patient Education  2017 [employer].

## 2016-08-23 NOTE — Addendum Note (Signed)
Addended by: Kathleen ArgueAYLOR, Sharae Zappulla M on: 08/23/2016 04:50 PM   Modules accepted: Orders

## 2016-08-23 NOTE — Telephone Encounter (Signed)
Pt calls and states the suppositories you ordered are $60.00, she would like the cream please, [facility]

## 2016-08-23 NOTE — Assessment & Plan Note (Signed)
Vitals:   08/23/16 1435  BP: 110/76  Pulse: 93  Temp: 97.8 F (36.6 C)   The patient has a history of hypertension. Currently she is at goal as evidenced by the above blood pressure. She is currently taking amlodipine 2.5 mg daily. We'll continue with this therapy. Refills have been provided. -- Continue amlodipine 2.5 mg daily

## 2016-08-23 NOTE — Assessment & Plan Note (Signed)
The patient presents with a three-day history of blood in her stool. She says she has a remote history of hemorrhoids that she has had for approximately 20 years but these have not been bothering her. She first noticed some blood with wiping on the toilet tissue. This is worsened over the last 72 hours and she noticed blood in her underwear today. She said the volume of blood today was concerning prompting her to come to the doctor. She has not seen blood in the bowl. No melena. No constipation or diarrhea physical examination reveals moderately severe hemorrhoidal disease. Mainly external hemorrhoids. The area is also very erythematous and appears irritated. I suspect this is the most likely etiology of the patient's presentation. She is not having hemorrhoidal pain or pruritus. However, I think she would benefit from a steroid suppository to reduce inflammation, diminished size of the hemorrhoids and prevent further bleeding. I will prescribe this for her and discussed with her that if her bleeding does not improve within the next week or worsens to make an appointment to return to clinic. -- Anusol 25 mg every 12 hours for 7 days -- CBC

## 2016-08-24 LAB — CBC WITH DIFFERENTIAL/PLATELET
BASOS: 0 %
Basophils Absolute: 0 10*3/uL (ref 0.0–0.2)
EOS (ABSOLUTE): 0.1 10*3/uL (ref 0.0–0.4)
Eos: 1 %
HEMOGLOBIN: 13 g/dL (ref 11.1–15.9)
Hematocrit: 38.7 % (ref 34.0–46.6)
IMMATURE GRANS (ABS): 0.1 10*3/uL (ref 0.0–0.1)
Immature Granulocytes: 1 %
LYMPHS: 17 %
Lymphocytes Absolute: 2 10*3/uL (ref 0.7–3.1)
MCH: 30.7 pg (ref 26.6–33.0)
MCHC: 33.6 g/dL (ref 31.5–35.7)
MCV: 91 fL (ref 79–97)
MONOCYTES: 11 %
Monocytes Absolute: 1.3 10*3/uL — ABNORMAL HIGH (ref 0.1–0.9)
NEUTROS ABS: 8.4 10*3/uL — AB (ref 1.4–7.0)
Neutrophils: 70 %
Platelets: 461 10*3/uL — ABNORMAL HIGH (ref 150–379)
RBC: 4.24 x10E6/uL (ref 3.77–5.28)
RDW: 15.6 % — ABNORMAL HIGH (ref 12.3–15.4)
WBC: 11.9 10*3/uL — ABNORMAL HIGH (ref 3.4–10.8)

## 2016-08-24 NOTE — Telephone Encounter (Signed)
done

## 2016-08-24 NOTE — Progress Notes (Signed)
Internal Medicine Clinic Attending  Case discussed with Dr. Ladona Ridgelaylor  At the time of the patient visit  We reviewed the resident's history and exam and pertinent patient test results.  I agree with the assessment, diagnosis, and plan of care documented in the resident's note.

## 2016-08-26 ENCOUNTER — Emergency Department (HOSPITAL_COMMUNITY): Payer: Managed Care, Other (non HMO)

## 2016-08-26 ENCOUNTER — Encounter (HOSPITAL_COMMUNITY): Payer: Self-pay | Admitting: Emergency Medicine

## 2016-08-26 ENCOUNTER — Observation Stay (HOSPITAL_COMMUNITY)
Admission: EM | Admit: 2016-08-26 | Discharge: 2016-08-28 | Disposition: A | Discharge status: Home / Self Care | Payer: Managed Care, Other (non HMO) | Attending: Internal Medicine | Admitting: Internal Medicine

## 2016-08-26 DIAGNOSIS — Z8679 Personal history of other diseases of the circulatory system: Secondary | ICD-10-CM

## 2016-08-26 DIAGNOSIS — Z885 Allergy status to narcotic agent status: Secondary | ICD-10-CM | POA: Diagnosis not present

## 2016-08-26 DIAGNOSIS — K861 Other chronic pancreatitis: Secondary | ICD-10-CM | POA: Diagnosis present

## 2016-08-26 DIAGNOSIS — Z86711 Personal history of pulmonary embolism: Secondary | ICD-10-CM | POA: Insufficient documentation

## 2016-08-26 DIAGNOSIS — F1011 Alcohol abuse, in remission: Secondary | ICD-10-CM | POA: Diagnosis not present

## 2016-08-26 DIAGNOSIS — F17210 Nicotine dependence, cigarettes, uncomplicated: Secondary | ICD-10-CM | POA: Insufficient documentation

## 2016-08-26 DIAGNOSIS — E871 Hypo-osmolality and hyponatremia: Secondary | ICD-10-CM | POA: Insufficient documentation

## 2016-08-26 DIAGNOSIS — K649 Unspecified hemorrhoids: Secondary | ICD-10-CM | POA: Diagnosis not present

## 2016-08-26 DIAGNOSIS — Z79899 Other long term (current) drug therapy: Secondary | ICD-10-CM | POA: Diagnosis not present

## 2016-08-26 DIAGNOSIS — J45909 Unspecified asthma, uncomplicated: Secondary | ICD-10-CM | POA: Insufficient documentation

## 2016-08-26 DIAGNOSIS — E876 Hypokalemia: Secondary | ICD-10-CM

## 2016-08-26 DIAGNOSIS — K644 Residual hemorrhoidal skin tags: Secondary | ICD-10-CM | POA: Diagnosis present

## 2016-08-26 DIAGNOSIS — E861 Hypovolemia: Secondary | ICD-10-CM | POA: Diagnosis not present

## 2016-08-26 DIAGNOSIS — K859 Acute pancreatitis without necrosis or infection, unspecified: Secondary | ICD-10-CM | POA: Diagnosis not present

## 2016-08-26 DIAGNOSIS — Z791 Long term (current) use of non-steroidal anti-inflammatories (NSAID): Secondary | ICD-10-CM | POA: Diagnosis not present

## 2016-08-26 DIAGNOSIS — I10 Essential (primary) hypertension: Secondary | ICD-10-CM | POA: Diagnosis not present

## 2016-08-26 DIAGNOSIS — K219 Gastro-esophageal reflux disease without esophagitis: Secondary | ICD-10-CM | POA: Diagnosis not present

## 2016-08-26 LAB — COMPREHENSIVE METABOLIC PANEL
ALK PHOS: 289 U/L — AB (ref 38–126)
ALT: 48 U/L (ref 14–54)
AST: 267 U/L — AB (ref 15–41)
Albumin: 2.3 g/dL — ABNORMAL LOW (ref 3.5–5.0)
Anion gap: 12 (ref 5–15)
BILIRUBIN TOTAL: 1.3 mg/dL — AB (ref 0.3–1.2)
CO2: 17 mmol/L — ABNORMAL LOW (ref 22–32)
CREATININE: 0.88 mg/dL (ref 0.44–1.00)
Calcium: 8.6 mg/dL — ABNORMAL LOW (ref 8.9–10.3)
Chloride: 101 mmol/L (ref 101–111)
GFR calc Af Amer: >60 mL/min (ref >60)
GFR calc non Af Amer: >60 mL/min (ref >60)
GLUCOSE: 128 mg/dL — AB (ref 65–99)
POTASSIUM: 2.8 mmol/L — AB (ref 3.5–5.1)
Sodium: 130 mmol/L — ABNORMAL LOW (ref 135–145)
TOTAL PROTEIN: 6.8 g/dL (ref 6.5–8.1)

## 2016-08-26 LAB — CBC
HEMATOCRIT: 40.6 % (ref 36.0–46.0)
Hemoglobin: 13.9 g/dL (ref 12.0–15.0)
MCH: 31.4 pg (ref 26.0–34.0)
MCHC: 34.2 g/dL (ref 30.0–36.0)
MCV: 91.6 fL (ref 78.0–100.0)
Platelets: 403 10*3/uL — ABNORMAL HIGH (ref 150–400)
RBC: 4.43 MIL/uL (ref 3.87–5.11)
RDW: 14.9 % (ref 11.5–15.5)
WBC: 20.7 10*3/uL — ABNORMAL HIGH (ref 4.0–10.5)

## 2016-08-26 LAB — LIPASE, BLOOD: Lipase: 14 U/L (ref 11–51)

## 2016-08-26 MED ORDER — IOPAMIDOL (ISOVUE-300) INJECTION 61%
INTRAVENOUS | Status: AC
  Administered 2016-08-26: 100 mL
  Filled 2016-08-26: qty 100

## 2016-08-26 MED ORDER — ONDANSETRON HCL 4 MG/2ML IJ SOLN: 4.0000 mg | Freq: Four times a day (QID) | INTRAMUSCULAR | Status: DC | PRN

## 2016-08-26 MED ORDER — KETOROLAC TROMETHAMINE 30 MG/ML IJ SOLN
30.0000 mg | Freq: Four times a day (QID) | INTRAMUSCULAR | Status: DC | PRN
  Administered 2016-08-27 – 2016-08-28 (×4): 30 mg via INTRAVENOUS
  Filled 2016-08-26 (×4): qty 1

## 2016-08-26 MED ORDER — HYDROCORTISONE 2.5 % RE CREA
TOPICAL_CREAM | Freq: Two times a day (BID) | RECTAL | Status: DC
  Administered 2016-08-26 – 2016-08-28 (×4): via RECTAL
  Filled 2016-08-26: qty 28.35

## 2016-08-26 MED ORDER — ONDANSETRON HCL 4 MG PO TABS: 8.0000 mg | ORAL_TABLET | Freq: Four times a day (QID) | ORAL | Status: DC | PRN

## 2016-08-26 MED ORDER — PANTOPRAZOLE SODIUM 40 MG IV SOLR
40.0000 mg | Freq: Once | INTRAVENOUS | Status: AC
  Administered 2016-08-26: 40 mg via INTRAVENOUS
  Filled 2016-08-26: qty 40

## 2016-08-26 MED ORDER — ACETAMINOPHEN 325 MG PO TABS: 650.0000 mg | ORAL_TABLET | Freq: Four times a day (QID) | ORAL | Status: DC | PRN

## 2016-08-26 MED ORDER — MAGNESIUM SULFATE 2 GM/50ML IV SOLN
2.0000 g | Freq: Once | INTRAVENOUS | Status: AC
  Administered 2016-08-26: 2 g via INTRAVENOUS
  Filled 2016-08-26: qty 50

## 2016-08-26 MED ORDER — ACETAMINOPHEN 650 MG RE SUPP: 650.0000 mg | Freq: Four times a day (QID) | RECTAL | Status: DC | PRN

## 2016-08-26 MED ORDER — PANTOPRAZOLE SODIUM 40 MG PO TBEC
40.0000 mg | DELAYED_RELEASE_TABLET | Freq: Every day | ORAL | Status: DC
  Administered 2016-08-27 – 2016-08-28 (×2): 40 mg via ORAL
  Filled 2016-08-26 (×2): qty 1

## 2016-08-26 MED ORDER — POTASSIUM CHLORIDE CRYS ER 20 MEQ PO TBCR
40.0000 meq | EXTENDED_RELEASE_TABLET | Freq: Once | ORAL | Status: AC
  Administered 2016-08-26: 40 meq via ORAL
  Filled 2016-08-26: qty 2

## 2016-08-26 MED ORDER — PROMETHAZINE HCL 25 MG PO TABS: 12.5000 mg | ORAL_TABLET | Freq: Four times a day (QID) | ORAL | Status: DC | PRN

## 2016-08-26 MED ORDER — FENTANYL CITRATE (PF) 100 MCG/2ML IJ SOLN
50.0000 ug | Freq: Once | INTRAMUSCULAR | Status: AC
  Administered 2016-08-26: 50 ug via INTRAVENOUS
  Filled 2016-08-26: qty 2

## 2016-08-26 MED ORDER — POTASSIUM CHLORIDE IN NACL 40-0.9 MEQ/L-% IV SOLN
INTRAVENOUS | Status: AC
  Administered 2016-08-26 – 2016-08-27 (×2): 200 mL/h via INTRAVENOUS
  Filled 2016-08-26 (×4): qty 1000

## 2016-08-26 MED ORDER — TRAMADOL HCL 50 MG PO TABS
50.0000 mg | ORAL_TABLET | Freq: Four times a day (QID) | ORAL | Status: DC | PRN
  Administered 2016-08-26 – 2016-08-28 (×2): 50 mg via ORAL
  Filled 2016-08-26 (×3): qty 1

## 2016-08-26 MED ORDER — PROMETHAZINE HCL 25 MG/ML IJ SOLN
12.5000 mg | Freq: Once | INTRAMUSCULAR | Status: AC
  Administered 2016-08-26: 12.5 mg via INTRAVENOUS
  Filled 2016-08-26: qty 1

## 2016-08-26 MED ORDER — PROMETHAZINE HCL 25 MG/ML IJ SOLN: 12.5000 mg | Freq: Four times a day (QID) | INTRAMUSCULAR | Status: DC | PRN

## 2016-08-26 MED ORDER — PROMETHAZINE HCL 25 MG RE SUPP: 12.5000 mg | Freq: Four times a day (QID) | RECTAL | Status: DC | PRN

## 2016-08-26 MED ORDER — ALBUTEROL SULFATE (2.5 MG/3ML) 0.083% IN NEBU: 2.5000 mg | INHALATION_SOLUTION | Freq: Four times a day (QID) | RESPIRATORY_TRACT | Status: DC | PRN

## 2016-08-26 MED ORDER — PANCRELIPASE (LIP-PROT-AMYL) 12000-38000 UNITS PO CPEP
48000.0000 [IU] | ORAL_CAPSULE | Freq: Three times a day (TID) | ORAL | Status: DC
  Administered 2016-08-27 – 2016-08-28 (×5): 48000 [IU] via ORAL
  Filled 2016-08-26 (×5): qty 4

## 2016-08-26 MED ORDER — SODIUM CHLORIDE 0.9 % IV BOLUS (SEPSIS)
1000.0000 mL | Freq: Once | INTRAVENOUS | Status: AC
  Administered 2016-08-26: 1000 mL via INTRAVENOUS

## 2016-08-26 MED ORDER — ENOXAPARIN SODIUM 40 MG/0.4ML ~~LOC~~ SOLN: 40.0000 mg | SUBCUTANEOUS | Status: DC

## 2016-08-26 NOTE — ED Provider Notes (Signed)
[facility]ENC[facility] DEPT Provider Note   CSN: [id number] Arrival date & time: 08/26/16  1108     History   Chief Complaint Chief Complaint  Patient presents with  . Abdominal Pain    HPI Sharon Kent is a 56 y.o. female.  Patient is a 56 year old female with a history of recurrent pancreatitis and a history of prior alcohol abuse. She's had a prior ductal stent placed at [facility]. She states that she's had worsening pain since yesterday, with associated nausea and vomiting. She's not able to keep anything down. She does have some worsening pain. She states her pain is lower than her typical pancreatitis type pain. She denies any fevers. No urinary symptoms. No diarrhea. She recently had some blood in her stool which was felt to be associated hemorrhoids. She denies any blood in her stool over the last couple of days. She's been taking Zofran at home as well as her typical tramadol for pain without improvement in symptoms.      Past Medical History:  Diagnosis Date  . Asthma   . Family history of adverse reaction to anesthesia    "don't think my son ever had anesthesia"  . GERD (gastroesophageal reflux disease)   . H/O chronic pancreatitis   . H/O ETOH abuse   . Headache    "monthly" (06/15/2016)  . History of blood transfusion 2000s   "when they explored my abdomen"  . Hx of pulmonary embolus   . Pancreatic abscess   . Pneumonia    "twice at least" (06/15/2016)    Patient Active Problem List   Diagnosis Date Noted  . Hemorrhoids 08/23/2016  . Hypertension 07/23/2016  . Acute pancreatitis 06/21/2016  . 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 Thoracic aortic atherosclerosis (HCC) 06/20/2016  . Dilation of pancreatic duct 06/16/2016  . Pancreatitis, acute 06/15/2016  . Acute on chronic pancreatitis (HCC) 06/15/2016  . Hepatic lesion 06/05/2016  . History of hypertension 06/05/2016  . Possible Cirrhosis 06/05/2016  . Hypokalemia   . Chronic pancreatitis (HCC)  04/21/2016  . Asthma 04/21/2016  . 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 S/P cholecystectomy 04/21/2016  . Tobacco abuse 04/21/2016  . Alcohol abuse 04/21/2016    Past Surgical History:  Procedure Laterality Date  . EXPLORATORY LAPAROTOMY  2000s   "took out all my organs and washed the dead tissue"  . IVC FILTER PLACEMENT ([facility] HX)  12/2010?  Marland Kitchen LAPAROSCOPIC CHOLECYSTECTOMY  2000s    OB History    No data available       Home Medications    Prior to Admission medications   Medication Sig Start Date End Date Taking? Authorizing Provider  Albuterol Sulfate 108 (90 Base) MCG/ACT AEPB Inhale 2 puffs into the lungs as needed. Patient taking differently: Inhale 2 puffs into the lungs every 4 (four) hours as needed (for shortness of breath).  06/05/16  Yes Burns, Alexa R, MD  amLODipine (NORVASC) 2.5 MG tablet Take 1 tablet (2.5 mg total) by mouth daily. 08/23/16 08/23/17 Yes Thomasene Lot, MD  hydrocortisone (ANUSOL-HC) 2.5 % rectal cream Apply rectally 2 times daily 08/23/16 08/23/17 Yes Thomasene Lot, MD  ibuprofen (ADVIL,MOTRIN) 200 MG tablet Take 400 mg by mouth every 6 (six) hours as needed for mild pain.    Yes [provider]  lipase/protease/amylase 24000-76000 units CPEP Take 2 capsules (48,000 Units total) by mouth 3 (three) times daily with meals. 06/20/16  Yes Rice, Jamesetta Orleans, MD  Multiple Vitamins-Minerals (MULTIVITAMIN WITH MINERALS) tablet Take 1 tablet  by mouth daily.   Yes [provider]  ondansetron (ZOFRAN ODT) 4 MG disintegrating tablet Take 1 tablet (4 mg total) by mouth every 8 (eight) hours as needed for nausea or vomiting. 08/23/16  Yes Thomasene Lot, MD  pantoprazole (PROTONIX) 40 MG tablet Take 1 tablet (40 mg total) by mouth daily. 07/21/16  Yes Rivet, Iris Pert, MD  potassium chloride SA (K-DUR,KLOR-CON) 20 MEQ tablet Take 2 tablets (40 mEq total) by mouth daily. 07/21/16  Yes Rivet, Iris Pert, MD  traMADol (ULTRAM) 50 MG tablet Take 1 tablet (50 mg total) by  mouth every 8 (eight) hours as needed for moderate pain. 08/23/16  Yes Thomasene Lot, MD  folic acid (FOLVITE) 1 MG tablet Take 1 tablet (1 mg total) by mouth daily. Patient not taking: Reported on 08/26/2016 07/21/16   Rivet, Iris Pert, MD    Family History Family History  Problem Relation Age of Onset  . Adopted: Yes  . Family history unknown: Yes    Social History Social History  Substance Use Topics  . Smoking status: Current Some Day Smoker    Packs/day: 0.50    Years: 31.00    Types: Cigarettes  . Smokeless tobacco: Never Used     Comment: DOWN TO 2 CIGARETTES A DAY  . Alcohol use Yes     Comment: 06/15/2016 "couple shots of liquor q 6 months"     Allergies   Morphine and Oxycodone-acetaminophen   Review of Systems Review of Systems  Constitutional: Negative for chills, diaphoresis, fatigue and fever.  HENT: Negative for congestion, rhinorrhea and sneezing.   Eyes: Negative.   Respiratory: Negative for cough, chest tightness and shortness of breath.   Cardiovascular: Negative for chest pain and leg swelling.  Gastrointestinal: Positive for abdominal pain, nausea and vomiting. Negative for blood in stool and diarrhea.  Genitourinary: Negative for difficulty urinating, flank pain, frequency and hematuria.  Musculoskeletal: Negative for arthralgias and back pain.  Skin: Negative for rash.  Neurological: Negative for dizziness, speech difficulty, weakness, numbness and headaches.     Physical Exam Updated Vital Signs BP 101/62   Pulse 92   Temp 98 F (36.7 C) (Oral)   Resp 16   SpO2 97%   Physical Exam  Constitutional: She is oriented to person, place, and time. She appears well-developed and well-nourished.  HENT:  Head: Normocephalic and atraumatic.  Eyes: Pupils are equal, round, and reactive to light.  Neck: Normal range of motion. Neck supple.  Cardiovascular: Normal rate, regular rhythm and normal heart sounds.   Pulmonary/Chest: Effort normal and breath  sounds normal. No respiratory distress. She has no wheezes. She has no rales. She exhibits no tenderness.  Abdominal: Soft. Bowel sounds are normal. There is tenderness (Positive diffuse tenderness, more in the right lower quadrant). There is no rebound and no guarding.  Musculoskeletal: Normal range of motion. She exhibits no edema.  Lymphadenopathy:    She has no cervical adenopathy.  Neurological: She is alert and oriented to person, place, and time.  Skin: Skin is warm and dry. No rash noted.  Psychiatric: She has a normal mood and affect.     ED Treatments / Results  Labs (all labs ordered are listed, but only abnormal results are displayed) Labs Reviewed  COMPREHENSIVE METABOLIC PANEL - Abnormal; Notable for the following:       Result Value   Sodium 130 (*)    Potassium 2.8 (*)    CO2 17 (*)    Glucose, Bld  128 (*)    BUN <5 (*)    Calcium 8.6 (*)    Albumin 2.3 (*)    AST 267 (*)    Alkaline Phosphatase 289 (*)    Total Bilirubin 1.3 (*)    All other components within normal limits  CBC - Abnormal; Notable for the following:    WBC 20.7 (*)    Platelets 403 (*)    All other components within normal limits  LIPASE, BLOOD  URINALYSIS, ROUTINE W REFLEX MICROSCOPIC    EKG  EKG Interpretation None       Radiology Ct Abdomen Pelvis W Contrast  Result Date: 08/26/2016 CLINICAL DATA:  Lower abdominal pain with nausea and vomiting for 5 days. History of pancreatitis. EXAM: CT ABDOMEN AND PELVIS WITH CONTRAST TECHNIQUE: Multidetector CT imaging of the abdomen and pelvis was performed using the standard protocol following bolus administration of intravenous contrast. CONTRAST:  ISOVUE-300 IOPAMIDOL (ISOVUE-300) INJECTION 61% COMPARISON:  MRI June 16, 2016 and CT scan May 27, 2016 FINDINGS: Lower chest: Dependent opacities, right greater than left, favored to primarily represent atelectasis. Infiltrate in the right base is considered less likely. Lung bases are  otherwise unremarkable. Hepatobiliary: Hepatic steatosis is identified. The ill defined mass like low-attenuation in the right hepatic lobe on series 3, images 33 and 32 is not significantly changed. No other focal liver masses are identified. The patient is status post cholecystectomy. The intrahepatic portal vein branches are well opacified as is the main portal vein. There is narrowing near the confluence of the splenic and superior mesenteric veins without complete occlusion, improved in the interval. 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 Pancreas: There is a stent in the pancreatic duct with some decompression of the duct. Calcifications in the pancreatic head are unchanged, consistent with chronic pancreatitis. There is mild expansion of the pancreatic tail with low-attenuation in the distal pancreatic tail. The pancreatic tail is similar in appearance in the interval. No pancreatic hemorrhage is noted. There is fluid in the left upper quadrant adjacent to the spleen and stomach. The fluid collection inferior to the spleen is multiloculated today with the entire collection measuring 9 x 5 cm on series 3, image 31. This collection is larger in the interval. Spleen: There is fluid adjacent to the spleen, increased in the interval. No splenic mass. Adrenals/Urinary Tract: The adrenal glands are stable with a calcified nodule in the right adrenal gland, unchanged. No suspicious renal masses or obstruction. No ureterectasis or ureteral stones. The bladder is partially decompressed but unremarkable. Stomach/Bowel: Varices in the wall of the stomach are more prominent in the interval. The stomach is otherwise unchanged. The small bowel is unobstructed. A loop of colon is herniated through an anterior ventral hernia on axial image 45 without obstruction. No evidence of colitis. The appendix is normal. Vascular/Lymphatic: Periportal collaterals are again identified. Perisplenic varices are again noted. The  splenic varices and varices in the wall of the stomach are more prominent in the interval. The abdominal aorta is non aneurysmal with no dissection. Minimal atherosclerotic change. The iliac and femoral vessels are unremarkable. An IVC filter is identified. No adenopathy. Reproductive: Uterus and bilateral adnexa are unremarkable. Other: No free air. Two anterior ventral hernias identified. One contains a loop of colon without obstruction. Musculoskeletal: No acute or significant osseous findings. IMPRESSION: 1. Evidence of pancreatitis. The stent in the pancreatic duct remains with partial decompression of the duct. There are certainly signs of chronic pancreatitis. However, the amount of  fluid and soft tissue attenuation surrounding the tail of the pancreas and partially surrounding the stomach and spleen has worsened in the interval. The fluid collection inferior to the spleen is now multiloculated measuring up to 9 x 4.8 cm. The evolving fluid collections are likely secondary to the patient's recent pancreatitis. Acute on subacute/chronic pancreatitis cannot be excluded. 2. Ventral hernias with 1 containing a loop of colon without obstruction. 3. Periportal, gastric wall, and splenic varices are seen and a little more prominent the interval. 4. There is narrowing near the junction of the splenic and superior mesenteric veins without complete obstruction. This has improved in the interval. 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 5. Ill defined low-attenuation regions in the right hepatic lobe are unchanged since February of 2018 and remain indeterminate. These were not well assessed on the March 2018 limited MRI. Electronically Signed   By: Gerome Samavid  Williams III M.D   On: 08/26/2016 15:24    Procedures Procedures (including critical care time)  Medications Ordered in ED Medications  potassium chloride SA (K-DUR,KLOR-CON) CR tablet 40 mEq (not administered)    promethazine (PHENERGAN) injection 12.5 mg (12.5 mg Intravenous Given 08/26/16 1331)  sodium chloride 0.9 % bolus 1,000 mL (0 mLs Intravenous Stopped 08/26/16 1432)  pantoprazole (PROTONIX) injection 40 mg (40 mg Intravenous Given 08/26/16 1332)  iopamidol (ISOVUE-300) 61 % injection (100 mLs  Contrast Given 08/26/16 1436)  promethazine (PHENERGAN) injection 12.5 mg (12.5 mg Intravenous Given 08/26/16 1605)  fentaNYL (SUBLIMAZE) injection 50 mcg (50 mcg Intravenous Given 08/26/16 1610)     Initial Impression / Assessment and Plan / ED Course  I have reviewed the triage vital signs and the nursing notes.  Pertinent labs & imaging results that were available during my care of the patient were reviewed by me and considered in my medical decision making (see chart for details).     Patient presents with worsening abdominal pain. Given the location was different than her typical abdominal pain, did do a CT scan which was negative for acute abnormalities other than acute on chronic pancreatitis. Her white count is 20,000. Her potassium is low and she was started on potassium supplementation in the ED. She was given IV fluids as well as Phenergan and fentanyl for symptomatically. I spoke with the resident with the internal medicine teaching service who will admit the patient.  Final Clinical Impressions(s) / ED Diagnoses   Final diagnoses:  Acute pancreatitis, unspecified complication status, unspecified pancreatitis type  Hypokalemia    New Prescriptions New Prescriptions   No medications on file     Rolan BuccoBelfi, Jennetta Flood, MD 08/26/16 [phone]

## 2016-08-26 NOTE — ED Notes (Signed)
Admitting at bedside 

## 2016-08-26 NOTE — ED Notes (Signed)
Patient back from CT and aware that there is a need for a urine sample.  Unable to give sample at this time.

## 2016-08-26 NOTE — H&P (Signed)
Date: 08/26/2016               Patient Name:  Sharon Kent MRN: [id number]  DOB: July 22, 1960 Age / Sex: 56 y.o., female   PCP: Gust Rung, DO         Medical Service: Internal Medicine Teaching Service         Attending Physician: Dr. Levert Feinstein, MD    First Contact: Raoul Pitch, MS 4 Pager: [phone]  Second Contact: Dr. Sheliah Hatch Pager: [phone]       After Hours (After 5p/  First Contact Pager: [phone]  weekends / holidays): Second Contact Pager: [phone]   Chief Complaint: Abdominal pain  History of Present Illness: Sharon Kent is a 56 y/o woman with a PMHx significant for chronic pancreatitis with multiple acute exacerbations, GERD, and asthma who presents to the ED with worsening abdominal pain, nausea, and vomiting since yesterday morning. She has been able to drink some water but nothing else by mouth. Home zofran and tramadol were not effective in treating her symptoms. She felt this was different from her pancreatitis pain due to location lower in her abdomen, but came to the hospital after it failed to improve for over a day. Her symptoms have been well controlled recently since stent placement at [facility] for pancreatic divisum. She denies any fever or chills. During this time she felt severely fatigued. She had recently bright red blood in her stools from external hemorrhoidal bleeding for which she was seen in clinic on 5/16.  After arrival to the ED she was found to have a leukocytosis greater than 20,000, multiple metabolic derangements, and abdominal CT consistent with acute on chronic pancreatitis. She received fentanyl and phenergan with improvement in symptoms but reports new pruritis.  Meds:  Current Meds  Medication Sig  . Albuterol Sulfate 108 (90 Base) MCG/ACT AEPB Inhale 2 puffs into the lungs as needed. (Patient taking differently: Inhale 2 puffs into the lungs every 4 (four) hours as needed (for shortness of breath). )  . amLODipine  (NORVASC) 2.5 MG tablet Take 1 tablet (2.5 mg total) by mouth daily.  . hydrocortisone (ANUSOL-HC) 2.5 % rectal cream Apply rectally 2 times daily  . ibuprofen (ADVIL,MOTRIN) 200 MG tablet Take 400 mg by mouth every 6 (six) hours as needed for mild pain.   Marland Kitchen lipase/protease/amylase 24000-76000 units CPEP Take 2 capsules (48,000 Units total) by mouth 3 (three) times daily with meals.  . Multiple Vitamins-Minerals (MULTIVITAMIN WITH MINERALS) tablet Take 1 tablet by mouth daily.  . ondansetron (ZOFRAN ODT) 4 MG disintegrating tablet Take 1 tablet (4 mg total) by mouth every 8 (eight) hours as needed for nausea or vomiting.  . pantoprazole (PROTONIX) 40 MG tablet Take 1 tablet (40 mg total) by mouth daily.  . potassium chloride SA (K-DUR,KLOR-CON) 20 MEQ tablet Take 2 tablets (40 mEq total) by mouth daily.  . traMADol (ULTRAM) 50 MG tablet Take 1 tablet (50 mg total) by mouth every 8 (eight) hours as needed for moderate pain.     Allergies: Allergies as of 08/26/2016 - Review Complete 08/26/2016  Allergen Reaction Noted  . Morphine Nausea Only 11/23/2015  . Oxycodone-acetaminophen Other (See Comments) 12/26/2012   Past Medical History:  Diagnosis Date  . Asthma   . Family history of adverse reaction to anesthesia    "don't think my son ever had anesthesia"  . GERD (gastroesophageal reflux disease)   . H/O chronic pancreatitis   . H/O ETOH abuse   .  Headache    "monthly" (06/15/2016)  . History of blood transfusion 2000s   "when they explored my abdomen"  . Hx of pulmonary embolus   . Pancreatic abscess   . Pneumonia    "twice at least" (06/15/2016)    Family History: Unknown - patient is adopted  Social History: Current every day smoker 1/2 ppd with approximately 15 pk-yr history     She is a former every day heavy drinker but denies any recent alcohol use since     hospitalizations in March. She works as a Engineer, [employer].  Review of Systems: Review of Systems  Constitutional: Negative for  chills and fever.  HENT: Negative for nosebleeds.   Eyes: Negative for blurred vision.  Respiratory: Negative for shortness of breath.   Cardiovascular: Negative for chest pain and leg swelling.  Gastrointestinal: Positive for abdominal pain, blood in stool, nausea and vomiting.  Genitourinary: Negative for hematuria.  Musculoskeletal: Negative for falls.  Skin: Positive for itching. Negative for rash.  Neurological: Negative for dizziness and focal weakness.  Psychiatric/Behavioral: Negative for substance abuse.     Physical Exam: Blood pressure 94/61, pulse 93, temperature 98 F (36.7 C), temperature source Oral, resp. rate 16, SpO2 95 %.  GENERAL- chronically ill appearing older than stated age, in bed with multiple blankets in NAD HEENT- Slightly dry mucous membranes, no oropharyngeal erythema CARDIAC- RRR, no murmurs, rubs or gallops RESP- CTAB, normal WOB ABDOMEN- Tenderness to palpation over epigastrum and LLQ, no rebound or guarding, surgical scars present, reducible ventral hernia BACK- No CVA tenderness. EXTREMITIES- Symmetric, no pedal edema, few scattered bruises and abrasions SKIN- Warm, dry, No rash or lesion. PSYCH- Normal mood and affect, appropriate thought content and speech.  Assessment & Plan by Problem: #Acute on chronic pancreatitis Her chronic pancreatitis was attributed to alcohol use but now complicated with pancreatic divisum s/p [facility]. She is not in acute distress but intolerance to PO intake combined with vomiting and electrolyte derangements warrants admission for IV fluids and antiemetics. She has no fever or hemodynamic changes to suggest a severe infection and her high leukocytosis may be secondary to inflammation alone. However, if new findings present these could indicate infectious complications of a chronic pancreatic pseudocyst.  IVF with electrolyte replacement, pain control, and antiemetics overnight. Bowel rest tonight except ice  and water. We will recheck lab studies tomorrow. Considering the degree of hypokalemia, hyponatremia, and acidosis present she needs improved oral intake before a safe discharge.  #Hypertension Blood pressure is low to normal today in the setting of N/V and no oral intake. Holding home amlodipine.  #Asthma Not in acute exacerbation, will continue albuterol PRN alone  #Hemorrhoids Recent active bleeding but without diarrhea or pain. This does not appear to be hemodynamically significant based on recent stable Hgb and symptoms are improving with steroid suppository.  Dispo: Admit patient to Observation with expected length of stay less than 2 midnights.  Signed: Fuller Planhristopher W Svetlana Bagby, MD PGY-II Internal Medicine Resident Pager# [phone] 08/26/2016, 5:57 PM

## 2016-08-26 NOTE — ED Notes (Signed)
Patient transported to CT 

## 2016-08-26 NOTE — ED Triage Notes (Addendum)
N/v/abd pain since Wed , saw dr wed  For the same , states had blood in stool, last BM was wed am , the dr trhought it was a hemmorroid, states had labs drawn on wed and was called and told  They were good

## 2016-08-27 ENCOUNTER — Encounter (HOSPITAL_COMMUNITY): Payer: Self-pay | Admitting: *Deleted

## 2016-08-27 DIAGNOSIS — K860 Alcohol-induced chronic pancreatitis: Secondary | ICD-10-CM | POA: Diagnosis not present

## 2016-08-27 DIAGNOSIS — K852 Alcohol induced acute pancreatitis without necrosis or infection: Secondary | ICD-10-CM | POA: Diagnosis not present

## 2016-08-27 DIAGNOSIS — I10 Essential (primary) hypertension: Secondary | ICD-10-CM

## 2016-08-27 DIAGNOSIS — Z9049 Acquired absence of other specified parts of digestive tract: Secondary | ICD-10-CM | POA: Diagnosis not present

## 2016-08-27 DIAGNOSIS — R740 Nonspecific elevation of levels of transaminase and lactic acid dehydrogenase [LDH]: Secondary | ICD-10-CM | POA: Diagnosis not present

## 2016-08-27 DIAGNOSIS — Z9689 Presence of other specified functional implants: Secondary | ICD-10-CM

## 2016-08-27 LAB — CBC
HCT: 33.7 % — ABNORMAL LOW (ref 36.0–46.0)
HEMOGLOBIN: 11.1 g/dL — AB (ref 12.0–15.0)
MCH: 30.3 pg (ref 26.0–34.0)
MCHC: 32.9 g/dL (ref 30.0–36.0)
MCV: 92.1 fL (ref 78.0–100.0)
PLATELETS: 250 10*3/uL (ref 150–400)
RBC: 3.66 MIL/uL — AB (ref 3.87–5.11)
RDW: 15.2 % (ref 11.5–15.5)
WBC: 9.6 10*3/uL (ref 4.0–10.5)

## 2016-08-27 LAB — URINALYSIS, ROUTINE W REFLEX MICROSCOPIC
Bilirubin Urine: NEGATIVE
Glucose, UA: NEGATIVE mg/dL
Ketones, ur: NEGATIVE mg/dL
LEUKOCYTES UA: NEGATIVE
Nitrite: NEGATIVE
Protein, ur: NEGATIVE mg/dL
SPECIFIC GRAVITY, URINE: 1.034 — AB (ref 1.005–1.030)
pH: 7 (ref 5.0–8.0)

## 2016-08-27 LAB — COMPREHENSIVE METABOLIC PANEL
ALK PHOS: 247 U/L — AB (ref 38–126)
ALT: 149 U/L — AB (ref 14–54)
ANION GAP: 10 (ref 5–15)
AST: 780 U/L — ABNORMAL HIGH (ref 15–41)
Albumin: 1.6 g/dL — ABNORMAL LOW (ref 3.5–5.0)
BILIRUBIN TOTAL: 1.2 mg/dL (ref 0.3–1.2)
BUN: 5 mg/dL — ABNORMAL LOW (ref 6–20)
CALCIUM: 8.1 mg/dL — AB (ref 8.9–10.3)
CO2: 19 mmol/L — ABNORMAL LOW (ref 22–32)
CREATININE: 0.75 mg/dL (ref 0.44–1.00)
Chloride: 106 mmol/L (ref 101–111)
Glucose, Bld: 77 mg/dL (ref 65–99)
Potassium: 4.1 mmol/L (ref 3.5–5.1)
Sodium: 135 mmol/L (ref 135–145)
Total Protein: 5.2 g/dL — ABNORMAL LOW (ref 6.5–8.1)

## 2016-08-27 LAB — MAGNESIUM: MAGNESIUM: 1.9 mg/dL (ref 1.7–2.4)

## 2016-08-27 NOTE — Progress Notes (Signed)
   Subjective: She feels much better than yesterday with some fatigue and abdominal soreness as persistent problems. She denies continued nausea, vomiting, or diarrhea. Her abdominal pain is improved without requiring additional medication. She reports sweating a lot overnight but no fevers were recorded.  Objective:  Vital signs in last 24 hours: Vitals:   08/26/16 1600 08/26/16 1630 08/26/16 2118 08/27/16 0629  BP: 105/66 94/61 104/67 110/68  Pulse: 95 93 84 84  Resp:   18 18  Temp:   98.9 F (37.2 C) 99.1 F (37.3 C)  TempSrc:      SpO2: 96% 95% 90% 98%   GENERAL- chronically ill appearing woman standing at hospital window in no acute distress CARDIAC- RRR, no murmurs, rubs or gallops RESP- CTAB, normal WOB ABDOMEN- Tenderness to palpation over epigastrum, LUQ, and LLQ, no rebound or guarding, surgical scars present, reducible ventral hernia on left EXTREMITIES- Symmetric, no pedal edema  Assessment/Plan: #Acute on chronic pancreatitis Leukocytosis is markedly improved today and no objectives fevers so I am not concerned for infection at this time. She is no longer requiring frequent medications for vomiting or pain, but with numerous electrolyte derangements on presentation she needs to be tolerating good oral intake before returning home. Symptoms are improving today and we will advance her diet. If tolerating well, no more IVF needed.  #Transaminitis Worsening LFT elevations from yesterday in a nonobstructive pattern. These are not atributable to a flare of pancreatitis so we will test for acute viral hepatitis as a possible cause. Previous abdominal imaging has revealed a R lobe liver mass on CT that was not redemonstrated on MRI, as well as diffuse steatosis. If LFTs are stable or improving tomorrow this could be treated supportively with outpatient follow up.  #Hypertension Blood pressure remains low to normal after initial IVF. Holding home amlodipine.  Dispo:  Anticipated discharge in approximately 1-2 day(s).   Fuller Planhristopher W Karisa Nesser, MD PGY-II Internal Medicine Resident Pager# [phone] 08/27/2016, 12:04 PM

## 2016-08-27 NOTE — Progress Notes (Signed)
  Subjective: [name] is feeling better this morning, although she is still having some abdominal "soreness" and feels very fatigued. She feels ready to try eating solid foods such as fresh fruit and denies nausea/vomiting. She does note that she has recently "lost the taste for food." She denies any known prior exposure to Hepatitis B or C.  Objective: Vital signs in last 24 hours: Vitals:   08/26/16 1600 08/26/16 1630 08/26/16 2118 08/27/16 0629  BP: 105/66 94/61 104/67 110/68  Pulse: 95 93 84 84  Resp:   18 18  Temp:   98.9 F (37.2 C) 99.1 F (37.3 C)  TempSrc:      SpO2: 96% 95% 90% 98%   Physical Exam General: Malnourished-appearing female walking around room. HEENT: Moist mucus membranes. No scleral icterus. Cardiac: Regular rate and rhythm. No murmurs, rubs, or gallops. No lower extremity edema. Pulmonary: Normal work of breathing. Clear to auscultation bilaterally. Abdomen: Diffuse tenderness to palpation throughout. No rebound or guarding. Midline surgical scar present. Extremities: Warm and dry.  Lab Results: Basic Metabolic Panel:  Recent Labs Lab 08/26/16 1137 08/27/16 0313  NA 130* 135  K 2.8* 4.1  CL 101 106  CO2 17* 19*  GLUCOSE 128* 77  BUN <5* 5*  CREATININE 0.88 0.75  CALCIUM 8.6* 8.1*  MG  --  1.9   Liver Function Tests:  Recent Labs Lab 08/26/16 1137 08/27/16 0313  AST 267* 780*  ALT 48 149*  ALKPHOS 289* 247*  BILITOT 1.3* 1.2  PROT 6.8 5.2*  ALBUMIN 2.3* 1.6*    Recent Labs Lab 08/26/16 1137  LIPASE 14   CBC:  Recent Labs Lab 08/23/16 1526 08/26/16 1137 08/27/16 0313  WBC 11.9* 20.7* 9.6  NEUTROABS 8.4*  --   --   HGB  --  13.9 11.1*  HCT 38.7 40.6 33.7*  MCV 91 91.6 92.1  PLT 461* 403* 250   Problem List: -- Acute on chronic pancreatitis -- Transaminitis -- Hypertension  Assessment and Plan By Problem: #Acute on chronic pancreatitis Clinically, she is doing better, with no further nausea/vomiting and  significantly decreased abdominal pain. Additionally, her leukocytosis has resolved and her hyponatremia/hypokalemia have normalized. We will advance her diet today and continue to monitor her closely. Once tolerating po intake and able to d/c IVF, she will be stable for discharge.  -- Advance diet from full liquid to regular as tolerated; may d/c IVF if she tolerates this well -- Follow CBC, BMP  #Transaminitis Liver enzymes today are significantly increased from yesterday and from her baseline, currently 780 AST and 149 ALT. Given this change as well as her recent anorexia, nausea and vomiting, it would be prudent to work up for acute viral hepatitis as a possible cause. -- Acute hepatitis panel  #Hypertension -- Continue to hold home amlodipine for now given low-borderline pressures  FENGI: Advance from full liquid diet to regular diet as tolerated DVT PPX: Lovenox subcut Code: Full Dispo: Admitted to Internal Medicine Teaching Service for further management. Anticipate 0-1 more midnight stay.  This is a Psychologist, occupationalMedical Student Note.  The care of the patient was discussed with Dr. Dimple Caseyice and the assessment and plan formulated with their assistance.  Please see their attached note for official documentation of the daily encounter.   LOS: 0 days   Sharon PitchVerma, Sharon Kent, Medical Student 08/27/2016, 11:56 AM

## 2016-08-28 ENCOUNTER — Telehealth: Payer: Self-pay | Admitting: Internal Medicine

## 2016-08-28 ENCOUNTER — Other Ambulatory Visit: Payer: Self-pay | Admitting: Pharmacist

## 2016-08-28 ENCOUNTER — Encounter: Payer: Self-pay | Admitting: Internal Medicine

## 2016-08-28 DIAGNOSIS — K649 Unspecified hemorrhoids: Secondary | ICD-10-CM

## 2016-08-28 DIAGNOSIS — K860 Alcohol-induced chronic pancreatitis: Secondary | ICD-10-CM | POA: Diagnosis not present

## 2016-08-28 DIAGNOSIS — I10 Essential (primary) hypertension: Secondary | ICD-10-CM | POA: Diagnosis not present

## 2016-08-28 DIAGNOSIS — R740 Nonspecific elevation of levels of transaminase and lactic acid dehydrogenase [LDH]: Secondary | ICD-10-CM | POA: Diagnosis not present

## 2016-08-28 DIAGNOSIS — K852 Alcohol induced acute pancreatitis without necrosis or infection: Secondary | ICD-10-CM | POA: Diagnosis not present

## 2016-08-28 DIAGNOSIS — Z885 Allergy status to narcotic agent status: Secondary | ICD-10-CM

## 2016-08-28 DIAGNOSIS — K861 Other chronic pancreatitis: Secondary | ICD-10-CM

## 2016-08-28 DIAGNOSIS — J45909 Unspecified asthma, uncomplicated: Secondary | ICD-10-CM

## 2016-08-28 DIAGNOSIS — E876 Hypokalemia: Secondary | ICD-10-CM

## 2016-08-28 LAB — CBC
HEMATOCRIT: 32.8 % — AB (ref 36.0–46.0)
HEMOGLOBIN: 10.8 g/dL — AB (ref 12.0–15.0)
MCH: 30 pg (ref 26.0–34.0)
MCHC: 32.9 g/dL (ref 30.0–36.0)
MCV: 91.1 fL (ref 78.0–100.0)
Platelets: 245 10*3/uL (ref 150–400)
RBC: 3.6 MIL/uL — AB (ref 3.87–5.11)
RDW: 15.4 % (ref 11.5–15.5)
WBC: 9.1 10*3/uL (ref 4.0–10.5)

## 2016-08-28 LAB — COMPREHENSIVE METABOLIC PANEL
ALT: 101 U/L — ABNORMAL HIGH (ref 14–54)
ANION GAP: 9 (ref 5–15)
AST: 191 U/L — AB (ref 15–41)
Albumin: 1.7 g/dL — ABNORMAL LOW (ref 3.5–5.0)
Alkaline Phosphatase: 235 U/L — ABNORMAL HIGH (ref 38–126)
BUN: 5 mg/dL — AB (ref 6–20)
CHLORIDE: 107 mmol/L (ref 101–111)
CO2: 18 mmol/L — ABNORMAL LOW (ref 22–32)
Calcium: 8.1 mg/dL — ABNORMAL LOW (ref 8.9–10.3)
Creatinine, Ser: 0.67 mg/dL (ref 0.44–1.00)
GFR calc Af Amer: >60 mL/min (ref >60)
Glucose, Bld: 106 mg/dL — ABNORMAL HIGH (ref 65–99)
POTASSIUM: 3.1 mmol/L — AB (ref 3.5–5.1)
Sodium: 134 mmol/L — ABNORMAL LOW (ref 135–145)
Total Bilirubin: 0.9 mg/dL (ref 0.3–1.2)
Total Protein: 5.3 g/dL — ABNORMAL LOW (ref 6.5–8.1)

## 2016-08-28 LAB — HEPATITIS PANEL, ACUTE
HCV Ab: 0.1 s/co ratio (ref 0.0–0.9)
Hep A IgM: NEGATIVE
Hep B C IgM: NEGATIVE
Hepatitis B Surface Ag: NEGATIVE

## 2016-08-28 MED ORDER — PANCRELIPASE (LIP-PROT-AMYL) 24000-76000 UNITS PO CPEP: 48000.0000 [IU] | ORAL_CAPSULE | Freq: Three times a day (TID) | ORAL | 3 refills | Status: DC

## 2016-08-28 MED ORDER — POTASSIUM CHLORIDE CRYS ER 20 MEQ PO TBCR
40.0000 meq | EXTENDED_RELEASE_TABLET | Freq: Two times a day (BID) | ORAL | Status: DC
  Administered 2016-08-28: 40 meq via ORAL
  Filled 2016-08-28: qty 2

## 2016-08-28 NOTE — Progress Notes (Signed)
Sharon MealingVictoria Kent to be D/C'd to home per MD order.  Discussed with the patient and all questions fully answered.  VSS, Skin clean, dry and intact without evidence of skin break down, no evidence of skin tears noted. IV catheter discontinued intact. Site without signs and symptoms of complications. Dressing and pressure applied.  An After Visit Summary was printed and given to the patient.  D/c education completed with patient/family including follow up instructions, medication list, d/c activities limitations if indicated, with other d/c instructions as indicated by MD - patient able to verbalize understanding, all questions fully answered.   Patient instructed to return to ED, call 911, or call MD for any changes in condition.   Patient escorted via WC, and D/C home via private auto.  [name] 08/28/2016 2:01 PM

## 2016-08-28 NOTE — Care Management Note (Addendum)
Case Management Note  Patient Details  Name: Sharon Kent MRN: [id number] Date of Birth: 05/09/1960  Subjective/Objective:      Acute on chronic pancreatitis, Transaminitis, HTN             Action/Plan: Discharge Planning: NCM spoke to pt and lives at home alone. Pt works full-time and requesting note for work to return Thursday (note will have to say full duty).  Has appt with PCP on 09/07/2016 8:15 am.   PCP Gust RungHOFFMAN, ERIK C MD  Expected Discharge Date:  08/28/2016              Expected Discharge Plan:  Home/Self Care  In-House Referral:  NA  Discharge planning Services  CM Consult  Post Acute Care Choice:  NA Choice offered to:  NA  DME Arranged:  N/A DME Agency:  NA  HH Arranged:  NA HH Agency:  NA  Status of Service:  Completed, signed off  If discussed at Long Length of Stay Meetings, dates discussed:    Additional Comments:  Elliot CousinShavis, Neomi Laidler Ellen, RN 08/28/2016, 12:39 PM

## 2016-08-28 NOTE — Progress Notes (Signed)
   Subjective: She feels better today and is tolerating a soft diet with no complaints. She had 2 loose bowel movements overnight which is typical for her. She has some mild back pain. She denies any fevers, chills, or nausea.  Objective:  Vital signs in last 24 hours: Vitals:   08/27/16 1416 08/27/16 1651 08/27/16 2146 08/28/16 0429  BP: 111/69  105/62 127/78  Pulse: 91  78 88  Resp: 16  17 18   Temp: 98.2 F (36.8 C)  98.7 F (37.1 C) 99.4 F (37.4 C)  TempSrc:      SpO2: 96%  99% 99%  Weight:  149 lb 6.4 oz (67.8 kg)    Height:  5\' 6"  (1.676 m)     GENERAL- Alert, cooperative, sitting in bed in no acute distress CARDIAC- RRR, no murmurs, rubs or gallops RESP- CTAB, normal WOB ABDOMEN- Tenderness to palpation over epigastrum, no rebound or guarding, surgical scars present with reducible ventral hernia on left EXTREMITIES- Symmetric, no pedal edema  Assessment/Plan: #Acute on chronic pancreatitis Symptoms are improved besides some persistent abdominal soreness. She is not requiring pain or nausea medications above her home regimen. [facility] this month and [facility] during the summer.  #Transaminitis LFTs are improving today without specific intervention. Her acute viral hepatitis panel remains pending and will be followed up outpatient. A possible explanation is dehydration related to her presenting N/V/D and PO intolerance could have decreased liver perfusion which is now improved, although she never suffered severe hypotension. LFTs were in a nonobstructive pattern so pancreatic or hepatobiliary duct involvement seems less likely.  #Hypokalemia K down to 3.1 today after home potassium as held yesterday. I suspect this is due to her chronic pancreatic insufficiency and GI losses. It will most likely normalize with resuming her home medications and can be checked in clinic.  Dispo: Anticipated discharge to home today  Fuller Planhristopher W Lamerle Jabs, MD PGY-II  Internal Medicine Resident Pager# [phone] 08/28/2016, 1:10 PM

## 2016-08-28 NOTE — Progress Notes (Signed)
  Subjective: [name] is feeling well this morning. She is having some mild diffuse abdominal pain which she attributes to gas. She is tolerating soft foods well without nausea or vomiting.   Objective: Vital signs in last 24 hours: Vitals:   08/27/16 1416 08/27/16 1651 08/27/16 2146 08/28/16 0429  BP: 111/69  105/62 127/78  Pulse: 91  78 88  Resp: 16  17 18   Temp: 98.2 F (36.8 C)  98.7 F (37.1 C) 99.4 F (37.4 C)  TempSrc:      SpO2: 96%  99% 99%  Weight:  67.8 kg (149 lb 6.4 oz)    Height:  5\' 6"  (1.676 m)     Physical Exam General: Pleasant female sitting up in bed. HEENT: Moist mucus membranes. No scleral icterus. Cardiac: Regular rate and rhythm. No murmurs, rubs, or gallops. No lower extremity edema. Pulmonary: Normal work of breathing. Clear to auscultation bilaterally. Abdomen: Normoactive bowel sounds. Midline surgical scar present. Mild tenderness to palpation in the epigastric region. No rebound or guarding.  Extremities: Warm and dry.  Lab Results: Basic Metabolic Panel:  Recent Labs Lab 08/27/16 0313 08/28/16 0522  NA 135 134*  K 4.1 3.1*  CL 106 107  CO2 19* 18*  GLUCOSE 77 106*  BUN 5* 5*  CREATININE 0.75 0.67  CALCIUM 8.1* 8.1*  MG 1.9  --    Liver Function Tests:  Recent Labs Lab 08/27/16 0313 08/28/16 0522  AST 780* 191*  ALT 149* 101*  ALKPHOS 247* 235*  BILITOT 1.2 0.9  PROT 5.2* 5.3*  ALBUMIN 1.6* 1.7*    Recent Labs Lab 08/26/16 1137  LIPASE 14   CBC:  Recent Labs Lab 08/23/16 1526  08/27/16 0313 08/28/16 0522  WBC 11.9*  < > 9.6 9.1  NEUTROABS 8.4*  --   --   --   HGB  --   < > 11.1* 10.8*  HCT 38.7  < > 33.7* 32.8*  MCV 91  < > 92.1 91.1  PLT 461*  < > 250 245  < > = values in this interval not displayed.  Problem List: -- Acute on chronic pancreatitis -- Transaminitis -- Hypertension  Assessment and Plan By Problem: #Acute on chronic pancreatitis She is doing well today with no further nausea/vomiting  and significantly decreased abdominal pain. Additionally, her leukocytosis remains resolved. She is tolerating oral intake well and currently stable for discharge. -- Follow-up as outpatient with [facility]  #Transaminitis Liver enzymes today are decreased from yesterday (780 AST and 149 ALT --> 191 AST and 101 ALT). Acute viral hepatitis appears less likely at this time, but can follow-up results of acute hepatitis panel as outpatient. More likely is liver hypoperfusion in the setting of hypovolemia due to dehydration from vomiting/diarrhea. -- Recheck liver enzymes and f/u acute hepatitis panel as outpatient with us in clinic  #Hypertension -- Continue to hold home amlodipine for now given low-borderline pressures  FENGI: Advance from soft to regular diet as tolerated DVT PPX: Lovenox subcut Code: Full Dispo: Patient is medically stable for discharge.  This is a Psychologist, occupationalMedical Student Note.  The care of the patient was discussed with Dr. Dimple Caseyice and the assessment and plan formulated with their assistance.  Please see their attached note for official documentation of the daily encounter.   LOS: 0 days   Raoul PitchVerma, Aubriana Ravelo, Medical Student 08/28/2016, 1:35 PM

## 2016-08-28 NOTE — Telephone Encounter (Signed)
Yes, I intended her to continue Creon at the previous dose. Her D/C summary is not complete due to discharge a few hours ago. I will place the new order with refill at this time and she is scheduled to see Dr. Mikey BussingHoffman on 5/31.  Thanks for informing me.  [name] [name]

## 2016-08-28 NOTE — Addendum Note (Signed)
Addended by: Mliss FritzKIM, JENNIFER J on: 08/28/2016 11:01 AM   Modules accepted: Orders

## 2016-08-28 NOTE — Discharge Summary (Signed)
Name: Sharon MealingVictoria Lazo MRN: [id number] DOB: 1960-07-22 56 y.o. PCP: Gust RungHoffman, Erik C, DO  Date of Admission: 08/26/2016 12:15 PM Date of Discharge: 08/28/2016 Attending Physician: Earl LagosNarendra, Nischal, MD  Discharge Diagnosis: Principal Problem:   Acute on chronic pancreatitis [facility](HCC) Active Problems:   Asthma   Hypokalemia   History of hypertension   Hemorrhoids   Discharge Medications: Allergies as of 08/28/2016      Reactions   Morphine Nausea Only   Oxycodone-acetaminophen Other (See Comments)   Makes her "loopy."      Medication List    TAKE these medications   Albuterol Sulfate 108 (90 Base) MCG/ACT Aepb Inhale 2 puffs into the lungs as needed. What changed:  when to take this  reasons to take this   amLODipine 2.5 MG tablet Commonly known as:  NORVASC Take 1 tablet (2.5 mg total) by mouth daily.   hydrocortisone 2.5 % rectal cream Commonly known as:  ANUSOL-HC Apply rectally 2 times daily   ibuprofen 200 MG tablet Commonly known as:  ADVIL,MOTRIN Take 400 mg by mouth every 6 (six) hours as needed for mild pain.   multivitamin with minerals tablet Take 1 tablet by mouth daily.   ondansetron 4 MG disintegrating tablet Commonly known as:  ZOFRAN ODT Take 1 tablet (4 mg total) by mouth every 8 (eight) hours as needed for nausea or vomiting.   Pancrelipase (Lip-Prot-Amyl) 24000-76000 units Cpep Take 2 capsules (48,000 Units total) by mouth 3 (three) times daily with meals.   pantoprazole 40 MG tablet Commonly known as:  PROTONIX Take 1 tablet (40 mg total) by mouth daily.   potassium chloride SA 20 MEQ tablet Commonly known as:  K-DUR,KLOR-CON Take 2 tablets (40 mEq total) by mouth daily.   traMADol 50 MG tablet Commonly known as:  ULTRAM Take 1 tablet (50 mg total) by mouth every 8 (eight) hours as needed for moderate pain.       Disposition and follow-up:   Ms.Anyae Ninetta LightsHatcher [facility].   At the hospital follow up visit please address:  1.  Acute on chronic pancreatitis: Please ask about resolution of abdominal pain, N/V and her oral intake. She was hypokalemic at discharge and needs repeat labs. Follow up is planned with [facility] for her pancreatic divisum.  2.  Transaminitis: LFT elevations up to 6 times ULN during the admission. Acute viral hepatitis panel was negative including HepB SAb so consider repeat immunization. She has had multiple imaging series of the abdomen showing inconsistent findings of mass and always steatosis of the liver.  3.  Labs / imaging needed at time of follow-up: Complete metabolic panel   Follow-up Appointments: Follow-up Information    Gust RungHoffman, Erik C, DO Follow up.   Specialty:  Internal Medicine Why:  please appt on Sep 07, 2016 at 8:15 am Contact information: [address]  [place] [address] [phone]           Hospital Course by problem list:  Acute on chronic pancreatitis She was admitted after 5 days of abdominal pain/N/V with low PO intake. She was tachycardic, hypokalemic, and acidotic on presentation likely from worsening of chronic pancreatitis. Symptoms improved quickly with toradol, bowel rest, and IVF replacement. She tolerated a solid diet without trouble by day 2. There was still mild, improving abdominal soreness at the epigastrum and LLQ at time of discharge. Home creon, tramadol continued and asked to avoid tylenol until F/U Cmet for  resolving LFTs.  Hypokalemia Potassium low at 2.8 on admission consistent with past pancreatitis episodes. Associated with N/V also chronic pancreatic insufficiency. This was corrected with IV replacement then home BID PO KCl resumed.  Transaminitis LFT elevations on admission that trended up for 1 day to 6 times ULN then back down. Acute viral hepatitis panel was drawn and entirely negative. Abdominal CT demonstrated known hepatosteatosis without new  changes.  HTN Antihypertensives were held during the admission for low or low-normal blood pressures due to hypovolemia. Resumed amlodipine at discharge with BP well controlled in 120s.  Discharge Vitals:   BP 127/78 (BP Location: Left Arm)   Pulse 88   Temp 99.4 F (37.4 C)   Resp 18   Ht 5\' 6"  (1.676 m)   Wt 149 lb 6.4 oz (67.8 kg)   SpO2 99%   BMI 24.11 kg/m   Pertinent Labs, Studies, and Procedures:  CT Abdomen/Pelvis 5/19: Recurrent pancreatitis with partial decompression of duct s/p stenting. Multiloculated fluid collection 9x4.8 cm on left.  Discharge Instructions: Discharge Instructions    Call MD for:  persistant nausea and vomiting    Complete by:  As directed    Call MD for:  severe uncontrolled pain    Complete by:  As directed    Call MD for:  temperature >100.4    Complete by:  As directed    Diet - low sodium heart healthy    Complete by:  As directed    Discharge instructions    Complete by:  As directed    Resume home medications and make sure to drink lots of fluid after returning home.  You can take ibuprofen for back and abdominal pain but avoid tylenol while waiting for follow up of liver function tests at your next clinic visit.  Please contact us immediately if you start to have new symptoms such as fevers, vomiting, or severe weakness.   Increase activity slowly    Complete by:  As directed       Signed: Fuller Plan, MD PGY-II Internal Medicine Resident Pager# [phone] 08/30/2016, 4:35 PM

## 2016-08-28 NOTE — Telephone Encounter (Signed)
-----   Message from Doneen PoissonLawrence Klima, MD sent at 08/28/2016  3:30 PM EDT ----- Regarding: Do you want patient to continue med at D/C? Dear Thayer Ohmhris,  You just discharged this patient.  Do you want her to continue the pancreatic enzymes?  If so please reorder at the dose you want.  Dr. Selena BattenKim sent the request to Dr. Mikey BussingHoffman but signed the encounter.  I therefore can not get into an encounter to do this for Dr. Mikey BussingHoffman as he is on vacation and it looks like you just discharged the patient and would be in a better position to know what she is to be discharge on than myself as a D/C summary is not yet in the chart.  Thanks,   Mariana ArnLarry Klima

## 2016-09-07 ENCOUNTER — Ambulatory Visit (INDEPENDENT_AMBULATORY_CARE_PROVIDER_SITE_OTHER): Payer: Managed Care, Other (non HMO) | Admitting: Internal Medicine

## 2016-09-07 ENCOUNTER — Encounter: Payer: Self-pay | Admitting: Internal Medicine

## 2016-09-07 VITALS — BP 121/85 | HR 83 | Temp 98.0°F | Ht 66.0 in | Wt 151.4 lb

## 2016-09-07 DIAGNOSIS — G8929 Other chronic pain: Secondary | ICD-10-CM

## 2016-09-07 DIAGNOSIS — D692 Other nonthrombocytopenic purpura: Secondary | ICD-10-CM | POA: Diagnosis not present

## 2016-09-07 DIAGNOSIS — F17210 Nicotine dependence, cigarettes, uncomplicated: Secondary | ICD-10-CM | POA: Diagnosis not present

## 2016-09-07 DIAGNOSIS — E876 Hypokalemia: Secondary | ICD-10-CM | POA: Diagnosis not present

## 2016-09-07 DIAGNOSIS — Z1239 Encounter for other screening for malignant neoplasm of breast: Secondary | ICD-10-CM

## 2016-09-07 DIAGNOSIS — Z79899 Other long term (current) drug therapy: Secondary | ICD-10-CM | POA: Diagnosis not present

## 2016-09-07 DIAGNOSIS — M546 Pain in thoracic spine: Secondary | ICD-10-CM | POA: Diagnosis not present

## 2016-09-07 DIAGNOSIS — K860 Alcohol-induced chronic pancreatitis: Secondary | ICD-10-CM

## 2016-09-07 DIAGNOSIS — G894 Chronic pain syndrome: Secondary | ICD-10-CM | POA: Insufficient documentation

## 2016-09-07 DIAGNOSIS — Z720 Tobacco use: Secondary | ICD-10-CM

## 2016-09-07 LAB — PROTIME-INR
INR: 1
Prothrombin Time: 13.2 seconds (ref 11.4–15.2)

## 2016-09-07 MED ORDER — TRAMADOL HCL 50 MG PO TABS: 50.0000 mg | ORAL_TABLET | Freq: Three times a day (TID) | ORAL | 0 refills | Status: DC | PRN

## 2016-09-07 MED ORDER — PANTOPRAZOLE SODIUM 40 MG PO TBEC: 40.0000 mg | DELAYED_RELEASE_TABLET | Freq: Every day | ORAL | 3 refills | Status: DC

## 2016-09-07 NOTE — Assessment & Plan Note (Signed)
HPI: Has continued potassium supplementation since her hospitalization in April.  A: Hypokalemia  P:Repeat CMP

## 2016-09-07 NOTE — Progress Notes (Signed)
[facility] Subjective:  HPI: [name] is a 56 y.o. female who presents for follow up of pancreatitis.  Please see problem based assessment and plan below for the status of her chronic medical problems.   Review of Systems: Per HPI Objective:  Physical Exam: Vitals:   09/07/16 0833  BP: 121/85  Pulse: 83  Temp: 98 F (36.7 C)  TempSrc: Oral  SpO2: 98%  Weight: 151 lb 6.4 oz (68.7 kg)  Height: '5\' 6"'  (1.676 m)  Physical Exam  Constitutional: She is well-developed, well-nourished, and in no distress.  Cardiovascular: Normal rate, regular rhythm and intact distal pulses.   Pulmonary/Chest: Effort normal and breath sounds normal. She has no wheezes.  Abdominal: Soft. Bowel sounds are normal. There is no tenderness.  Musculoskeletal: She exhibits no edema.       Thoracic back: She exhibits tenderness. She exhibits no bony tenderness, no swelling and no edema.  Tenderness in rhomboid area (T5-8) L>R  Skin:  Multiple areas of purple discoloration on bilateral arms.  Nursing note and vitals reviewed.   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  She was transferred to [facility] where she underwent stent placement.  She then reports she was getting back to normal. However last week she did have a hospitalization pancreatitis and elevated liver enzymes.  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  She reports that she does have follow up upcoming with [facility] and she understands the priority of keeping that appointment.  Purpura, nonthrombopenic  (HCC) HPI: She is concerned that she bleeds and bruises  easily on her arms when she bumps into things.  She has had no other no bleeding or brusing.  She has no family history of bleeding disorders.  She does not take aspirin. She has been taking Ibuprofen.  A: Nontrhombopenic purpura  P:  Disussed the nature of this, will repeat CBC and check INR given some concern for liver disease.  Advised continued abstinence from alcohol and to stop Ibuprofen.  Tobacco abuse HPI: Down to 2 cig a day, trying to break the habit  A: Tobacco abuse  P: Encouraged setting quit date.  Hypokalemia HPI: Has continued potassium supplementation since her hospitalization in April.  A: Hypokalemia  P:Repeat CMP  Chronic thoracic back pain HPI: She has had chronic bilateral back pain for the last several years.  She reports she used to also have pain more in her lower back, knees and ankles however she has improved her foot where as well as her work has a softer floor thus these complaints have improved.  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 She previously took tylenol and this helped but was told not to because of her liver. She then has been taking Ibuprofen which also helps some.  She has also for the last few months been prescribed with tramadol.  She notes that the tramadol is effective at reducing her back pain to a tolerable levels for her to work but that it wears off after about 4 hours.  She does not have any fatigue, or drowysness  on the medication.  She has not noticed any adverse effects from the tramadol.  She also denies constipation.  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 I think she is getting some benefit from tramadol, so we will increase the amount provided from 30 per month to 60.  This would allow her to take the medication twice a day. 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 We will obtain a UDS today I reviewed the [place] most Rx are from [name], she had two very limited scripts from Feb, March I believe from ED providers. I had her sign a controlled substance agreement and personally went over our clinic policy.   Medications Ordered Meds ordered this encounter  Medications  . DISCONTD: traMADol (ULTRAM) 50 MG tablet    Sig: Take 1 tablet (50 mg total) by mouth every 8 (eight) hours as needed for moderate pain.    Dispense:  60 tablet    Refill:  0    Rx 1/3, 30 day supply  . DISCONTD: traMADol (ULTRAM) 50 MG tablet    Sig: Take 1 tablet (50 mg total) by mouth every 8 (eight) hours as needed for moderate pain.    Dispense:  60 tablet    Refill:  0    Rx 2/3, 30 day supply, please fill 30 days after last refill  . traMADol (ULTRAM) 50 MG tablet    Sig: Take 1 tablet (50 mg total) by mouth every 8 (eight) hours as needed for moderate pain.    Dispense:  60 tablet    Refill:  0    Rx 3/3, 30 day supply, please fill 30 days after last refill  . pantoprazole (PROTONIX) 40 MG tablet    Sig: Take 1 tablet (40 mg total) by mouth daily.    Dispense:  90 tablet    Refill:  3   Other Orders Orders Placed This Encounter  Procedures  . MM Digital Screening    Standing Status:   Future    Standing Expiration Date:   11/07/2017    Order Specific Question:   Reason for Exam (SYMPTOM  OR DIAGNOSIS REQUIRED)    Answer:   breast cancer screening    Order Specific Question:   Is the patient pregnant?    Answer:   No    Order Specific Question:   Preferred imaging location?    Answer:   [facility]  . CMP14 + Anion Gap  . CBC with Diff  . Protime-INR  . ToxAssure Select,+Antidepr,UR   Follow Up: Return in about 3 months (around 12/08/2016).

## 2016-09-07 NOTE — Assessment & Plan Note (Signed)
HPI: Down to 2 cig a day, trying to break the habit  A: Tobacco abuse  P: Encouraged setting quit date.

## 2016-09-07 NOTE — Assessment & Plan Note (Signed)
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  She was transferred to [facility] where she underwent stent placement.  She then reports she was getting back to normal. However last week she did have a hospitalization pancreatitis and elevated liver enzymes.  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  She reports that she does have follow up upcoming with [facility] and she understands the priority of keeping that appointment.

## 2016-09-07 NOTE — Patient Instructions (Addendum)
I want you to stop taking Ibuprofen, please continue to abstain from alcohol and work on stopping smoking.

## 2016-09-07 NOTE — Assessment & Plan Note (Signed)
HPI: She has had chronic bilateral back pain for the last several years.  She reports she used to also have pain more in her lower back, knees and ankles however she has improved her foot where as well as her work has a softer floor thus these complaints have improved.  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 She previously took tylenol and this helped but was told not to because of her liver. She then has been taking Ibuprofen which also helps some.  She has also for the last few months been prescribed with tramadol.  She notes that the tramadol is effective at reducing her back pain to a tolerable levels for her to work but that it wears off after about 4 hours.  She does not have any fatigue, or drowysness on the medication.  She has not noticed any adverse effects from the tramadol.  She also denies constipation.  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 I think she is getting some benefit from tramadol, so we will increase the amount provided from 30 per month to 60.  This would allow her to take the medication twice a day. 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 We will obtain a UDS today I reviewed the [facility] most Rx are from us, she had two very limited scripts from Feb, March I believe from ED providers. I had her sign a controlled substance agreement and personally went over our clinic policy.

## 2016-09-07 NOTE — Assessment & Plan Note (Signed)
HPI: She is concerned that she bleeds and bruises  easily on her arms when she bumps into things.  She has had no other no bleeding or brusing.  She has no family history of bleeding disorders.  She does not take aspirin. She has been taking Ibuprofen.  A: Nontrhombopenic purpura  P:  Disussed the nature of this, will repeat CBC and check INR given some concern for liver disease.  Advised continued abstinence from alcohol and to stop Ibuprofen.

## 2016-09-08 LAB — CBC WITH DIFFERENTIAL/PLATELET
Basophils Absolute: 0 10*3/uL (ref 0.0–0.2)
Basos: 0 %
EOS (ABSOLUTE): 0.1 10*3/uL (ref 0.0–0.4)
EOS: 1 %
HEMATOCRIT: 36.9 % (ref 34.0–46.6)
Hemoglobin: 12 g/dL (ref 11.1–15.9)
IMMATURE GRANULOCYTES: 1 %
Immature Grans (Abs): 0.1 10*3/uL (ref 0.0–0.1)
Lymphocytes Absolute: 2.2 10*3/uL (ref 0.7–3.1)
Lymphs: 26 %
MCH: 30.8 pg (ref 26.6–33.0)
MCHC: 32.5 g/dL (ref 31.5–35.7)
MCV: 95 fL (ref 79–97)
MONOS ABS: 0.7 10*3/uL (ref 0.1–0.9)
Monocytes: 8 %
NEUTROS PCT: 64 %
Neutrophils Absolute: 5.4 10*3/uL (ref 1.4–7.0)
Platelets: 548 10*3/uL — ABNORMAL HIGH (ref 150–379)
RBC: 3.9 x10E6/uL (ref 3.77–5.28)
RDW: 16.4 % — AB (ref 12.3–15.4)
WBC: 8.6 10*3/uL (ref 3.4–10.8)

## 2016-09-08 LAB — CMP14 + ANION GAP
ALK PHOS: 210 IU/L — AB (ref 39–117)
ALT: 15 IU/L (ref 0–32)
ANION GAP: 14 mmol/L (ref 10.0–18.0)
AST: 32 IU/L (ref 0–40)
Albumin/Globulin Ratio: 0.9 — ABNORMAL LOW (ref 1.2–2.2)
Albumin: 3 g/dL — ABNORMAL LOW (ref 3.5–5.5)
BUN/Creatinine Ratio: 6 — ABNORMAL LOW (ref 9–23)
BUN: 5 mg/dL — AB (ref 6–24)
Bilirubin Total: 0.5 mg/dL (ref 0.0–1.2)
CO2: 22 mmol/L (ref 18–29)
CREATININE: 0.81 mg/dL (ref 0.57–1.00)
Calcium: 9.2 mg/dL (ref 8.7–10.2)
Chloride: 103 mmol/L (ref 96–106)
GFR calc Af Amer: 94 mL/min/{1.73_m2} (ref >59)
GFR calc non Af Amer: 81 mL/min/{1.73_m2} (ref >59)
GLOBULIN, TOTAL: 3.4 g/dL (ref 1.5–4.5)
GLUCOSE: 79 mg/dL (ref 65–99)
Potassium: 4.6 mmol/L (ref 3.5–5.2)
SODIUM: 139 mmol/L (ref 134–144)
Total Protein: 6.4 g/dL (ref 6.0–8.5)

## 2016-09-13 LAB — TOXASSURE SELECT,+ANTIDEPR,UR

## 2016-09-14 ENCOUNTER — Other Ambulatory Visit: Payer: Self-pay | Admitting: Internal Medicine

## 2016-11-10 ENCOUNTER — Other Ambulatory Visit: Payer: Self-pay

## 2016-11-10 NOTE — Telephone Encounter (Signed)
ondansetron (ZOFRAN-ODT) 4 MG disintegrating tablet, REFILL [facility] [address].

## 2016-11-13 ENCOUNTER — Ambulatory Visit (HOSPITAL_COMMUNITY)
Admission: RE | Admit: 2016-11-13 | Discharge: 2016-11-13 | Disposition: A | Discharge status: Home / Self Care | Payer: Managed Care, Other (non HMO) | Source: Ambulatory Visit | Attending: Internal Medicine | Admitting: Internal Medicine

## 2016-11-13 ENCOUNTER — Other Ambulatory Visit: Payer: Self-pay | Admitting: Internal Medicine

## 2016-11-13 ENCOUNTER — Ambulatory Visit (INDEPENDENT_AMBULATORY_CARE_PROVIDER_SITE_OTHER): Payer: Managed Care, Other (non HMO) | Admitting: Internal Medicine

## 2016-11-13 ENCOUNTER — Encounter: Payer: Self-pay | Admitting: Internal Medicine

## 2016-11-13 VITALS — BP 121/75 | HR 65 | Temp 98.0°F | Ht 66.0 in | Wt 141.7 lb

## 2016-11-13 DIAGNOSIS — R5382 Chronic fatigue, unspecified: Secondary | ICD-10-CM

## 2016-11-13 DIAGNOSIS — K861 Other chronic pancreatitis: Secondary | ICD-10-CM | POA: Insufficient documentation

## 2016-11-13 DIAGNOSIS — F17210 Nicotine dependence, cigarettes, uncomplicated: Secondary | ICD-10-CM

## 2016-11-13 DIAGNOSIS — Z79899 Other long term (current) drug therapy: Secondary | ICD-10-CM

## 2016-11-13 DIAGNOSIS — Z9049 Acquired absence of other specified parts of digestive tract: Secondary | ICD-10-CM | POA: Insufficient documentation

## 2016-11-13 DIAGNOSIS — Z720 Tobacco use: Secondary | ICD-10-CM

## 2016-11-13 DIAGNOSIS — R634 Abnormal weight loss: Secondary | ICD-10-CM | POA: Insufficient documentation

## 2016-11-13 MED ORDER — ONDANSETRON 4 MG PO TBDP: ORAL_TABLET | ORAL | 0 refills | Status: DC

## 2016-11-13 NOTE — Assessment & Plan Note (Addendum)
[name] has unintentionally lost 40 lbs over the past 8 months with associated fatigue and a history significant for smoking. These symptoms are alarming for malignancy, therefore, we will get a CT Chest w/Contrast. While she denies concurrent symptoms of both hyper- and hypothyroidism we will check a TSH to ensure it is appropriate. Her weight loss and fatigue seem to be due to decreased PO intake and desire to eat. Her symptoms of early satiety and N/V are concerning for gastroparesis; however, she has no history of diabetes or childhood infection of polio and it does not appear to be food dependent. It is very likely that her weight loss and fatigue are related to her chronic pancreatitis. We discussed pre-treating with Zofran to see if that helped her appetite and associated nausea. We also discussed that smoking decreases your appetite and can make food taste bland. She is trying to quit. I do not believe there is a concurrent aspect of depression.   Plan: - TSH - CT Chest w/Contrast - Smoking cessation  - Prescription for Zofran sent out again, there are pending orders however, pt states that she is out.

## 2016-11-13 NOTE — Assessment & Plan Note (Signed)
ERCP on 7/26: 2 previously placed pancreatic stents removed, good flow thru pancreatic duct, 1 stone removed, 1 stent placed with pigtail. Would like KUB to ensure stent has passed. Called GI office, will fax over results.   Plan:  - Abdominal X-ray

## 2016-11-13 NOTE — Assessment & Plan Note (Signed)
Discussed smoking cessation. She is currently trying to quit by tapering her daily cigarette use. She is not interested in nicotine replacement or other pharmacological therapy at this point.

## 2016-11-13 NOTE — Progress Notes (Signed)
Internal Medicine Clinic Attending  I saw and evaluated the patient.  I personally confirmed the key portions of the history and exam documented by Dr. Helberg and I reviewed pertinent patient test results.  The assessment, diagnosis, and plan were formulated together and I agree with the documentation in the resident's note. 

## 2016-11-13 NOTE — Progress Notes (Signed)
   CC: Fatigue and Weight loss  HPI:  [name] is a 56 y.o. with a PMHx significant of chronic pancreatitis s/p ERCP on 7/26 presenting with progressive weight loss and fatigue of 1 year duration. She has unintentionally lost 40lbs over the past 8 months. Unable to tolerate much oral PO, food unappealing. Early satiety dating back to before the procedure, not associated with different foods (liquid vs solids), able to handle carbonated beverages, with N/V, no diarrhea, no constipation, insurance only covers 16 pills of Zofran per 30 days. Fatigue is beginning to significant affect her life. She recently decreased her work schedule to 4 days per week due to the fatigue. Significant smoking history, trying to stop. She is unaware of her family history, she was adopted. Denies symptoms consistent with hyper- /hypothyroid except fatigue and weight loss. Denies hemoptysis, cough, night sweats, fever, chills.   ERCP on 7/26: 2 previously placed pancreatic stents removed, good flow thru pancreatic duct, 1 stone removed, 1 stent placed with pigtail. Would like KUB to ensure stent has passed. Called GI office, will fax over results.   Increased desire to sleep, energy decreased, no guilty, change in appetite, cogniition intact, no SI or HI.   Past Medical History:  Diagnosis Date  . Asthma   . Family history of adverse reaction to anesthesia    "don't think my son ever had anesthesia"  . GERD (gastroesophageal reflux disease)   . H/O chronic pancreatitis   . H/O ETOH abuse   . Headache    "monthly" (06/15/2016)  . History of blood transfusion 2000s   "when they explored my abdomen"  . Hx of pulmonary embolus   . Pancreatic abscess   . Pneumonia    "twice at least" (06/15/2016)   Review of Systems:   Denies chest pain, palpitations, night sweats, fevers, chills, diarrhea, constipation.   Physical Exam: Vitals:   11/13/16 0855  BP: 121/75  Pulse: 65  Temp: 98 F (36.7 C)  TempSrc:  Oral  SpO2: 100%  Weight: 141 lb 11.2 oz (64.3 kg)  Height: 5\' 6"  (1.676 m)   Physical Exam  Constitutional: She is oriented to person, place, and time. She appears well-developed. No distress.  HENT:  Head: Normocephalic and atraumatic.  Eyes: Pupils are equal, round, and reactive to light. Conjunctivae are normal.  Cardiovascular: Normal rate, regular rhythm, normal heart sounds and intact distal pulses.   Pulmonary/Chest: Effort normal and breath sounds normal. She has no wheezes.  Abdominal: Soft. Bowel sounds are normal. There is tenderness (epigastric).  Musculoskeletal: She exhibits no edema.  Neurological: She is alert and oriented to person, place, and time.  Skin: Skin is warm and dry.  Bruising on forearms bilaterally, unchanged from baseline per patient.   Assessment & Plan:   See Encounters Tab for problem based charting.  Patient seen with Dr. Heide SparkNarendra

## 2016-11-13 NOTE — Telephone Encounter (Signed)
NEEDS REFILL ZOFRAN, [facility]MG , TO [facility] [phone]

## 2016-11-13 NOTE — Telephone Encounter (Signed)
This has been done.

## 2016-11-13 NOTE — Patient Instructions (Addendum)
It was a pleasure to meet you today. Today we:  - Ordered a Chest CT.   - Ordered an Abdominal X-ray for your stomach doctors.   - Will be drawing some blood work.   - Please stop smoking.

## 2016-11-14 LAB — TSH: TSH: 0.971 u[IU]/mL (ref 0.450–4.500)

## 2016-11-22 NOTE — Telephone Encounter (Signed)
done

## 2016-11-29 ENCOUNTER — Encounter (HOSPITAL_COMMUNITY): Payer: Self-pay | Admitting: Emergency Medicine

## 2016-11-29 ENCOUNTER — Observation Stay (HOSPITAL_COMMUNITY): Payer: Managed Care, Other (non HMO)

## 2016-11-29 ENCOUNTER — Inpatient Hospital Stay (HOSPITAL_COMMUNITY)
Admission: EM | Admit: 2016-11-29 | Discharge: 2016-12-01 | DRG: 640 | Disposition: A | Discharge status: Home / Self Care | Payer: Managed Care, Other (non HMO) | Attending: Internal Medicine | Admitting: Internal Medicine

## 2016-11-29 DIAGNOSIS — Z9689 Presence of other specified functional implants: Secondary | ICD-10-CM | POA: Diagnosis present

## 2016-11-29 DIAGNOSIS — K909 Intestinal malabsorption, unspecified: Secondary | ICD-10-CM

## 2016-11-29 DIAGNOSIS — Z6822 Body mass index (BMI) 22.0-22.9, adult: Secondary | ICD-10-CM

## 2016-11-29 DIAGNOSIS — R197 Diarrhea, unspecified: Secondary | ICD-10-CM | POA: Diagnosis present

## 2016-11-29 DIAGNOSIS — R112 Nausea with vomiting, unspecified: Secondary | ICD-10-CM | POA: Diagnosis not present

## 2016-11-29 DIAGNOSIS — Q453 Other congenital malformations of pancreas and pancreatic duct: Secondary | ICD-10-CM

## 2016-11-29 DIAGNOSIS — Z86711 Personal history of pulmonary embolism: Secondary | ICD-10-CM

## 2016-11-29 DIAGNOSIS — E876 Hypokalemia: Secondary | ICD-10-CM | POA: Diagnosis not present

## 2016-11-29 DIAGNOSIS — K219 Gastro-esophageal reflux disease without esophagitis: Secondary | ICD-10-CM | POA: Diagnosis not present

## 2016-11-29 DIAGNOSIS — E43 Unspecified severe protein-calorie malnutrition: Secondary | ICD-10-CM | POA: Diagnosis present

## 2016-11-29 DIAGNOSIS — J45909 Unspecified asthma, uncomplicated: Secondary | ICD-10-CM | POA: Diagnosis present

## 2016-11-29 DIAGNOSIS — Z886 Allergy status to analgesic agent status: Secondary | ICD-10-CM

## 2016-11-29 DIAGNOSIS — Z79899 Other long term (current) drug therapy: Secondary | ICD-10-CM

## 2016-11-29 DIAGNOSIS — K860 Alcohol-induced chronic pancreatitis: Secondary | ICD-10-CM | POA: Diagnosis present

## 2016-11-29 DIAGNOSIS — K861 Other chronic pancreatitis: Secondary | ICD-10-CM

## 2016-11-29 DIAGNOSIS — I10 Essential (primary) hypertension: Secondary | ICD-10-CM | POA: Diagnosis not present

## 2016-11-29 DIAGNOSIS — K903 Pancreatic steatorrhea: Secondary | ICD-10-CM | POA: Diagnosis not present

## 2016-11-29 DIAGNOSIS — E872 Acidosis: Secondary | ICD-10-CM | POA: Diagnosis present

## 2016-11-29 DIAGNOSIS — F17210 Nicotine dependence, cigarettes, uncomplicated: Secondary | ICD-10-CM | POA: Diagnosis present

## 2016-11-29 DIAGNOSIS — K8681 Exocrine pancreatic insufficiency: Secondary | ICD-10-CM | POA: Diagnosis present

## 2016-11-29 DIAGNOSIS — Z885 Allergy status to narcotic agent status: Secondary | ICD-10-CM

## 2016-11-29 LAB — COMPREHENSIVE METABOLIC PANEL
ALBUMIN: 2.5 g/dL — AB (ref 3.5–5.0)
ALBUMIN: 2.4 g/dL — AB (ref 3.5–5.0)
ALT: 36 U/L (ref 14–54)
ALT: 34 U/L (ref 14–54)
ANION GAP: 10 (ref 5–15)
AST: 80 U/L — AB (ref 15–41)
AST: 80 U/L — ABNORMAL HIGH (ref 15–41)
Alkaline Phosphatase: 219 U/L — ABNORMAL HIGH (ref 38–126)
Alkaline Phosphatase: 210 U/L — ABNORMAL HIGH (ref 38–126)
Anion gap: 13 (ref 5–15)
BILIRUBIN TOTAL: 4.5 mg/dL — AB (ref 0.3–1.2)
BILIRUBIN TOTAL: 4.4 mg/dL — AB (ref 0.3–1.2)
BUN: <5 mg/dL — ABNORMAL LOW (ref 6–20)
CO2: 23 mmol/L (ref 22–32)
CO2: 21 mmol/L — ABNORMAL LOW (ref 22–32)
CREATININE: 0.95 mg/dL (ref 0.44–1.00)
Calcium: 8.3 mg/dL — ABNORMAL LOW (ref 8.9–10.3)
Calcium: 7.7 mg/dL — ABNORMAL LOW (ref 8.9–10.3)
Chloride: 98 mmol/L — ABNORMAL LOW (ref 101–111)
Chloride: 104 mmol/L (ref 101–111)
Creatinine, Ser: 0.86 mg/dL (ref 0.44–1.00)
GFR calc Af Amer: >60 mL/min (ref >60)
GLUCOSE: 129 mg/dL — AB (ref 65–99)
GLUCOSE: 139 mg/dL — AB (ref 65–99)
Potassium: <2 mmol/L — CL (ref 3.5–5.1)
Sodium: 134 mmol/L — ABNORMAL LOW (ref 135–145)
Sodium: 135 mmol/L (ref 135–145)
Total Protein: 6.1 g/dL — ABNORMAL LOW (ref 6.5–8.1)
Total Protein: 5.6 g/dL — ABNORMAL LOW (ref 6.5–8.1)

## 2016-11-29 LAB — I-STAT CHEM 8, ED
BUN: <3 mg/dL — ABNORMAL LOW (ref 6–20)
Calcium, Ion: 1.02 mmol/L — ABNORMAL LOW (ref 1.15–1.40)
Chloride: 102 mmol/L (ref 101–111)
Creatinine, Ser: 0.7 mg/dL (ref 0.44–1.00)
Glucose, Bld: 98 mg/dL (ref 65–99)
HEMATOCRIT: 34 % — AB (ref 36.0–46.0)
HEMOGLOBIN: 11.6 g/dL — AB (ref 12.0–15.0)
POTASSIUM: 2.3 mmol/L — AB (ref 3.5–5.1)
SODIUM: 138 mmol/L (ref 135–145)
TCO2: 23 mmol/L (ref 0–100)

## 2016-11-29 LAB — CBC
HCT: 32.5 % — ABNORMAL LOW (ref 36.0–46.0)
Hemoglobin: 11.8 g/dL — ABNORMAL LOW (ref 12.0–15.0)
MCH: 36.1 pg — ABNORMAL HIGH (ref 26.0–34.0)
MCHC: 36.3 g/dL — AB (ref 30.0–36.0)
MCV: 99.4 fL (ref 78.0–100.0)
PLATELETS: 239 10*3/uL (ref 150–400)
RBC: 3.27 MIL/uL — ABNORMAL LOW (ref 3.87–5.11)
RDW: 16.1 % — AB (ref 11.5–15.5)
WBC: 8.3 10*3/uL (ref 4.0–10.5)

## 2016-11-29 LAB — URINALYSIS, ROUTINE W REFLEX MICROSCOPIC
Bilirubin Urine: NEGATIVE
GLUCOSE, UA: NEGATIVE mg/dL
Hgb urine dipstick: NEGATIVE
KETONES UR: NEGATIVE mg/dL
NITRITE: NEGATIVE
PH: 7 (ref 5.0–8.0)
Protein, ur: NEGATIVE mg/dL
SPECIFIC GRAVITY, URINE: 1.006 (ref 1.005–1.030)

## 2016-11-29 LAB — BASIC METABOLIC PANEL
ANION GAP: 12 (ref 5–15)
BUN: <5 mg/dL — ABNORMAL LOW (ref 6–20)
CALCIUM: 7.7 mg/dL — AB (ref 8.9–10.3)
CO2: 15 mmol/L — AB (ref 22–32)
Chloride: 109 mmol/L (ref 101–111)
Creatinine, Ser: 0.88 mg/dL (ref 0.44–1.00)
GFR calc non Af Amer: >60 mL/min (ref >60)
Glucose, Bld: 144 mg/dL — ABNORMAL HIGH (ref 65–99)
POTASSIUM: 2.7 mmol/L — AB (ref 3.5–5.1)
Sodium: 136 mmol/L (ref 135–145)

## 2016-11-29 LAB — LIPASE, BLOOD: Lipase: 26 U/L (ref 11–51)

## 2016-11-29 LAB — MAGNESIUM: Magnesium: 2 mg/dL (ref 1.7–2.4)

## 2016-11-29 MED ORDER — TRAMADOL HCL 50 MG PO TABS: 50.0000 mg | ORAL_TABLET | Freq: Three times a day (TID) | ORAL | Status: DC | PRN

## 2016-11-29 MED ORDER — BOOST / RESOURCE BREEZE PO LIQD
1.0000 | Freq: Three times a day (TID) | ORAL | Status: DC
  Administered 2016-11-29 – 2016-11-30 (×2): 1 via ORAL

## 2016-11-29 MED ORDER — ADULT MULTIVITAMIN W/MINERALS CH
1.0000 | ORAL_TABLET | Freq: Every day | ORAL | Status: DC
  Administered 2016-11-30 – 2016-12-01 (×2): 1 via ORAL
  Filled 2016-11-29 (×2): qty 1

## 2016-11-29 MED ORDER — PANCRELIPASE (LIP-PROT-AMYL) 12000-38000 UNITS PO CPEP
24000.0000 [IU] | ORAL_CAPSULE | Freq: Three times a day (TID) | ORAL | Status: DC
  Administered 2016-11-29 – 2016-11-30 (×2): 24000 [IU] via ORAL
  Filled 2016-11-29 (×2): qty 2

## 2016-11-29 MED ORDER — PROMETHAZINE HCL 12.5 MG RE SUPP
12.5000 mg | Freq: Four times a day (QID) | RECTAL | Status: DC | PRN
  Filled 2016-11-29: qty 1

## 2016-11-29 MED ORDER — MAGNESIUM SULFATE 2 GM/50ML IV SOLN
2.0000 g | Freq: Once | INTRAVENOUS | Status: AC
  Administered 2016-11-29: 2 g via INTRAVENOUS
  Filled 2016-11-29: qty 50

## 2016-11-29 MED ORDER — POTASSIUM CHLORIDE 10 MEQ/100ML IV SOLN
10.0000 meq | INTRAVENOUS | Status: AC
  Administered 2016-11-29 (×3): 10 meq via INTRAVENOUS
  Filled 2016-11-29 (×3): qty 100

## 2016-11-29 MED ORDER — ONDANSETRON 4 MG PO TBDP
4.0000 mg | ORAL_TABLET | Freq: Once | ORAL | Status: AC | PRN
  Administered 2016-11-29: 4 mg via ORAL

## 2016-11-29 MED ORDER — PROMETHAZINE HCL 25 MG PO TABS: 12.5000 mg | ORAL_TABLET | Freq: Four times a day (QID) | ORAL | Status: DC | PRN

## 2016-11-29 MED ORDER — SODIUM CHLORIDE 0.9 % IV BOLUS (SEPSIS)
1000.0000 mL | Freq: Once | INTRAVENOUS | Status: AC
  Administered 2016-11-29: 1000 mL via INTRAVENOUS

## 2016-11-29 MED ORDER — METOCLOPRAMIDE HCL 5 MG/ML IJ SOLN
10.0000 mg | Freq: Three times a day (TID) | INTRAMUSCULAR | Status: DC | PRN
  Administered 2016-11-29 – 2016-11-30 (×2): 10 mg via INTRAVENOUS
  Filled 2016-11-29 (×2): qty 2

## 2016-11-29 MED ORDER — PROMETHAZINE HCL 25 MG/ML IJ SOLN
6.2500 mg | Freq: Four times a day (QID) | INTRAMUSCULAR | Status: DC | PRN
  Administered 2016-11-29 – 2016-12-01 (×4): 6.25 mg via INTRAVENOUS
  Filled 2016-11-29 (×4): qty 1

## 2016-11-29 MED ORDER — ALBUTEROL SULFATE (2.5 MG/3ML) 0.083% IN NEBU: 3.0000 mL | INHALATION_SOLUTION | RESPIRATORY_TRACT | Status: DC | PRN

## 2016-11-29 MED ORDER — POTASSIUM CHLORIDE 20 MEQ/15ML (10%) PO SOLN
60.0000 meq | Freq: Once | ORAL | Status: AC
  Administered 2016-11-29: 60 meq via ORAL
  Filled 2016-11-29 (×2): qty 45

## 2016-11-29 MED ORDER — ENOXAPARIN SODIUM 40 MG/0.4ML ~~LOC~~ SOLN
40.0000 mg | SUBCUTANEOUS | Status: DC
  Filled 2016-11-29 (×2): qty 0.4

## 2016-11-29 MED ORDER — POTASSIUM CHLORIDE CRYS ER 20 MEQ PO TBCR
40.0000 meq | EXTENDED_RELEASE_TABLET | Freq: Once | ORAL | Status: AC
  Administered 2016-11-29: 40 meq via ORAL
  Filled 2016-11-29: qty 2

## 2016-11-29 MED ORDER — KCL IN DEXTROSE-NACL 40-5-0.45 MEQ/L-%-% IV SOLN
INTRAVENOUS | Status: AC
  Administered 2016-11-29: 19:00:00 via INTRAVENOUS
  Filled 2016-11-29 (×2): qty 1000

## 2016-11-29 MED ORDER — ONDANSETRON 4 MG PO TBDP
ORAL_TABLET | ORAL | Status: AC
  Filled 2016-11-29: qty 1

## 2016-11-29 MED ORDER — POTASSIUM CHLORIDE 20 MEQ/15ML (10%) PO SOLN
60.0000 meq | Freq: Three times a day (TID) | ORAL | Status: DC
  Administered 2016-11-29 – 2016-12-01 (×6): 60 meq via ORAL
  Filled 2016-11-29 (×6): qty 45

## 2016-11-29 MED ORDER — SODIUM CHLORIDE 0.9 % IV BOLUS (SEPSIS): 500.0000 mL | Freq: Once | INTRAVENOUS | Status: DC

## 2016-11-29 MED ORDER — KCL IN DEXTROSE-NACL 40-5-0.45 MEQ/L-%-% IV SOLN
INTRAVENOUS | Status: DC
  Administered 2016-11-29: 14:00:00 via INTRAVENOUS
  Filled 2016-11-29: qty 1000

## 2016-11-29 MED ORDER — PANTOPRAZOLE SODIUM 40 MG PO TBEC
40.0000 mg | DELAYED_RELEASE_TABLET | Freq: Every day | ORAL | Status: DC
  Administered 2016-11-30 – 2016-12-01 (×2): 40 mg via ORAL
  Filled 2016-11-29 (×2): qty 1

## 2016-11-29 NOTE — H&P (Signed)
Date: 11/29/2016               Patient Name:  Sharon Kent MRN: [id number]  DOB: 1960/10/24 Age / Sex: 56 y.o., female   PCP: Lucious Groves, DO         Medical Service: Internal Medicine Teaching Service         Attending Physician: Dr. Oval Linsey, MD    First Contact: Dr. Ronalee Red Pager: [phone]  Second Contact: Dr. Charlynn Grimes Pager: [phone]       After Hours (After 5p/  First Contact Pager: [phone]  weekends / holidays): Second Contact Pager: [phone]   Chief Complaint: Weakness  History of Present Illness:  Sharon Kent is a 56 year old woman with PMH of Chronic Pancreatitis, HTN, GERD, and asthma who presents with progressive weakness. Patient has a complicated pancreatic history including history of necrotizing pancreatitis requiring exploratory laparotomy for drainage/debridement of pancreatic abscesses in 2012, ERCP showing pancreatic divisum s/p sphincterotomy with 2 stent placements March 2018, repeat ERCP on 11/02/16 for removal of prior stents and placement of stent in minor papilla/dorsal pancreatic duct (for prevention of post-ERCP pancreatitis).   She reports progressive decreased appetite for several months with occasional regurgitation of food 15 minutes after swallowing. She is able to drink fluids fairly well. She has a progressive weight loss of nearly 50 lbs since January 2018. She has noticed progressive generalized weakness over the last couple weeks. She says she has had similar weakness before when her potassium was low. She does report some loose stool which is not watery but floats in the commode without yellow discoloration. She decided to present to the ED due to her continued weakness. She says her previous pancreatic abdominal pain is significantly improved after she had her stent placements. She currently denies any abdominal pain, fevers, chills, diaphoresis, chest pain, dyspnea, constipation, or loss of consciousness.  In the ED, initial  vitals were: BP 118/85, Pulse 81, Temp 98.1F, RR 18, SpO2 100% on RA. Lab work was notable for Potassium <2.0, Albumin 2.5, AST 80, ALT 36, Alk phos 219, Tbili 4.5. She was given oral KDur 40 mEq once, 30 mEq KCL via IV, IV Magnesium sulfate 2 grams, and 1 L NS bolus. IMTS were called for management of hypokalemia.   Meds:  Current Meds  Medication Sig  . Albuterol Sulfate 108 (90 Base) MCG/ACT AEPB Inhale 2 puffs into the lungs as needed. (Patient taking differently: Inhale 2 puffs into the lungs every 4 (four) hours as needed (for shortness of breath). )  . Multiple Vitamins-Minerals (MULTIVITAMIN WITH MINERALS) tablet Take 1 tablet by mouth daily.  . ondansetron (ZOFRAN-ODT) 4 MG disintegrating tablet TAKE 1 TABLET BY MOUTH EVERY 8 HOURS AS NEEDED FOR NAUSEA AND VOMITING  . Pancrelipase, Lip-Prot-Amyl, 24000-76000 units CPEP Take 2 capsules (48,000 Units total) by mouth 3 (three) times daily with meals.  . pantoprazole (PROTONIX) 40 MG tablet Take 1 tablet (40 mg total) by mouth daily.  . potassium chloride SA (K-DUR,KLOR-CON) 20 MEQ tablet Take 2 tablets (40 mEq total) by mouth daily. (Patient taking differently: Take 20 mEq by mouth daily. )  . traMADol (ULTRAM) 50 MG tablet Take 1 tablet (50 mg total) by mouth every 8 (eight) hours as needed for moderate pain.     Allergies: Allergies as of 11/29/2016 - Review Complete 11/29/2016  Allergen Reaction Noted  . Morphine Nausea Only 11/23/2015  . Oxycodone-acetaminophen Other (See Comments) 12/26/2012   Past Medical History:  Diagnosis Date  . Asthma   . Family history of adverse reaction to anesthesia       . GERD (gastroesophageal reflux disease)   . H/O chronic pancreatitis   . H/O ETOH abuse   . Headache    "monthly" (06/15/2016)  . History of blood transfusion 2000s   "when they explored my abdomen"  . Hx of pulmonary embolus   . Pancreatic abscess   . Pneumonia    "twice at least" (06/15/2016)    Family History:  Family  History  Problem Relation Age of Onset  . Adopted: Yes  . Family history unknown: Yes     Social History:  Social History   Social History  . Marital status: Divorced    Spouse name: N/A  . Number of children: N/A  . Years of education: N/A   Social History Main Topics  . Smoking status: Current Some Day Smoker    Packs/day: 0.50    Years: 31.00    Types: Cigarettes  . Smokeless tobacco: Never Used     Comment: DOWN TO 2 CIGARETTES A DAY  . Alcohol use Yes     Comment: 06/15/2016 "couple shots of liquor q 6 months"  . Drug use: No  . Sexual activity: Not Currently   Other Topics Concern  . None   Social History Narrative  . None     Review of Systems: A complete ROS was negative except as per HPI.   Physical Exam: Blood pressure 112/77, pulse 72, temperature 98.2 F (36.8 C), temperature source Oral, resp. rate 15, height _0  (1.676 m), weight 131 lb 11.2 oz (59.7 kg), SpO2 98 %. Physical Exam  Constitutional: She is oriented to person, place, and time. No distress.  Appears older than actual age, appears tired  HENT:  Head: Normocephalic and atraumatic.  Mouth/Throat: Oropharynx is clear and moist.  Eyes: Pupils are equal, round, and reactive to light. EOM are normal. No scleral icterus.  Cardiovascular: Normal rate and regular rhythm.   No murmur heard. Pulmonary/Chest: Effort normal. No respiratory distress. She has no wheezes. She has no rales.  Abdominal: Soft. Bowel sounds are normal. She exhibits no distension. There is no tenderness. There is no guarding.  Well healed abdominal surgical scar  Musculoskeletal: She exhibits no edema or tenderness.  Strength 5/5 b/l upper extremities. Strength 4/5 bilateral lower extremities against resistance.  Neurological: She is alert and oriented to person, place, and time.  Skin: Skin is warm. Capillary refill takes less than 2 seconds. She is not diaphoretic.  Psychiatric: She has a normal mood and affect.        EKG: personally reviewed my interpretation is Sinus rhythm, rate 72 bpm, long QT interval, PVC, decreased T wave amplitude from prior.  CXR: not obtained  Assessment & Plan by Problem: Principal Problem:   Hypokalemia Active Problems:   Chronic pancreatitis (Max)   Asthma   Hypertension  56 year old woman with PMH of Chronic Pancreatitis, HTN, GERD, and asthma who is admitted with potassium <2.0.   Hypokalemia: Patient is significantly hypokalemic with lab reading of <2.0. This is likely due to significantly reduced dietary intake in addition to some GI loss with report of loose stools, although her bicarb is normal and chloride is slightly decreased. Her hypokalemia and decreased nutritional status are also likely the cause of her progressive weakness. She did receive about 70 mEq supplement of potassium in the ED, but will need further supplementation. She is not  on diuretics and her magnesium is normal after supplementation.  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 She has stated that she is no longer using alcohol. Of note, a recent [employer] drug screen in May did show unexpected metabolites of ethyl alcohol. She does have poor appetite with significantly decreased intake and weight loss of nearly 50 lbs over the last 7 months. This is likely related to her chronic pancreatitis, however this would also be concerning for underlying malignancy. She currently is not having any abdominal pain.  Her AST/ALT is elevated in 2:1 ratio and Tbili is also elevated at 4.5. She is s/p cholecystecomtoy. KUB on 11/13/16 to assess for spontaneous passage of stent showed that her prior stent placed on 7/26 was still present with pigtail portion possibly in inferior duodenum. - Resume home Creon - Nutrition consulted - Phenergan prn, Pantoprazole - Home Tramadol prn pain - Obtain indirect/direct bilirubin - Repeat LFTs in AM - Repeat KUB - Consider CT Chest and/or Abdomen to assess for underlying malignancy  Dispo: Admit patient to Observation with expected length of stay less than 2 midnights.  Signed: Zada Finders, MD 11/29/2016, 5:02 PM

## 2016-11-29 NOTE — ED Triage Notes (Addendum)
Pt reports 4 days of vomiting/diarrhea. States 3 episodes of each in the last 24 hours. Issues with low potassium in the past. Denies any pain to abdomen / chest. Hx of pancreatitis, states her "pancreas feels fine." ambulatory at triage. Afebrile.  Pt states unable to eat/drink much fluids past 4 days. Pt states she also feels weak.

## 2016-11-29 NOTE — ED Provider Notes (Signed)
[facility] DEPT Provider Note   CSN: [id number] Arrival date & time: 11/29/16  0354     History   Chief Complaint Chief Complaint  Patient presents with  . Emesis  . Diarrhea    HPI Sharon Kent is a 56 y.o. female.  HPI  Ms. Barman is a 56yo female with a history of chronic pancreatitis with repeated surgical stenting procedures of the pancreas, alcohol abuse (currently sober), tobacco abuse, hypertension, asthma who presents to the Emergency Department for evaluation of vomiting, lightheadedness, diarrhea. Patient states that she started to feel sick four days ago and "thinks it has something to do with my potassium." She says that she has had problems with her potassium being too low in the past, and she has been unable to take her potassium pills by mouth since the vomiting started four days ago. She reports vomiting 6-7 times yesterday, unable to hold down any foods or fluids. Vomit is yellow in color, she denies hematemesis. She also states that she has had some diarrhea, about two loose bowel movements per day, denies melena, hematochezia. Overnight she felt lightheaded and unsteady on her feet which prompted her to drive herself to the ER. She is able to ambulate on her own. She also endorses loss of appetite, weight loss, fatigue, weakness. She denies fever, diaphoresis, abdominal pain, chest pain, shortness of breath, numbness. Other than procedures on her pancreas, she has had a cholecystectomy.   Past Medical History:  Diagnosis Date  . Asthma   . Family history of adverse reaction to anesthesia    "don't think my son ever had anesthesia"  . GERD (gastroesophageal reflux disease)   . H/O chronic pancreatitis   . H/O ETOH abuse   . Headache    "monthly" (06/15/2016)  . History of blood transfusion 2000s   "when they explored my abdomen"  . Hx of pulmonary embolus   . Pancreatic abscess   . Pneumonia    "twice at least" (06/15/2016)    Patient Active Problem  List   Diagnosis Date Noted  . Unintentional weight loss of more than 10 pounds 11/13/2016  . 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 Chronic thoracic back pain 09/07/2016  . Hemorrhoids 08/23/2016  . Hypertension 07/23/2016  . Acute pancreatitis 06/21/2016  . 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 Thoracic aortic atherosclerosis ([place]) 06/20/2016  . Dilation of pancreatic duct 06/16/2016  . Pancreatitis, acute 06/15/2016  . Acute on chronic pancreatitis ([place]) 06/15/2016  . Hepatic lesion 06/05/2016  . History of hypertension 06/05/2016  . Possible Cirrhosis 06/05/2016  . Hypokalemia   . Chronic pancreatitis ([place]) 04/21/2016  . Asthma 04/21/2016  . 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 S/P cholecystectomy 04/21/2016  . Tobacco abuse 04/21/2016  . Alcohol abuse 04/21/2016  . History of partial pancreatectomy 11/24/2015    Past Surgical History:  Procedure Laterality Date  . EXPLORATORY LAPAROTOMY  2000s   "took out all my organs and washed the dead tissue"  . IVC FILTER PLACEMENT ([facility] HX)  12/2010?  Marland Kitchen LAPAROSCOPIC CHOLECYSTECTOMY  2000s    OB History    No data available       Home Medications    Prior to Admission medications   Medication Sig Start Date End Date Taking? Authorizing Provider  Albuterol Sulfate 108 (90 Base) MCG/ACT AEPB Inhale 2 puffs into the lungs as needed. Patient taking differently: Inhale 2 puffs into the lungs every 4 (four) hours as needed (for shortness of breath).  06/05/16  Yes Burns, Arloa Koh, MD  Multiple Vitamins-Minerals (MULTIVITAMIN WITH MINERALS) tablet Take 1 tablet by mouth daily.   Yes [provider]  ondansetron (ZOFRAN-ODT) 4 MG disintegrating tablet TAKE 1 TABLET BY MOUTH EVERY 8 HOURS AS NEEDED FOR NAUSEA AND VOMITING 11/13/16  Yes Helberg, Larkin Ina, MD  Pancrelipase, Lip-Prot-Amyl, 24000-76000 units CPEP Take 2 capsules (48,000 Units total) by mouth 3 (three) times daily with meals. 08/28/16  Yes Rice,  Resa Miner, MD  pantoprazole (PROTONIX) 40 MG tablet Take 1 tablet (40 mg total) by mouth daily. 09/07/16  Yes Lucious Groves, DO  potassium chloride SA (K-DUR,KLOR-CON) 20 MEQ tablet Take 2 tablets (40 mEq total) by mouth daily. Patient taking differently: Take 20 mEq by mouth daily.  07/21/16  Yes Rivet, Sindy Guadeloupe, MD  traMADol (ULTRAM) 50 MG tablet Take 1 tablet (50 mg total) by mouth every 8 (eight) hours as needed for moderate pain. 09/07/16  Yes Lucious Groves, DO    Family History Family History  Problem Relation Age of Onset  . Adopted: Yes  . Family history unknown: Yes    Social History Social History  Substance Use Topics  . Smoking status: Current Some Day Smoker    Packs/day: 0.50    Years: 31.00    Types: Cigarettes  . Smokeless tobacco: Never Used     Comment: DOWN TO 2 CIGARETTES A DAY  . Alcohol use Yes     Comment: 06/15/2016 "couple shots of liquor q 6 months"     Allergies   Morphine and Oxycodone-acetaminophen   Review of Systems Review of Systems  Constitutional: Positive for appetite change (low), fatigue and unexpected weight change (weight loss). Negative for chills and fever.  HENT: Positive for sore throat. Negative for ear pain and hearing loss.   Eyes: Negative for pain, redness and visual disturbance.  Respiratory: Positive for cough (with dry heaves and vomiting). Negative for chest tightness, shortness of breath and wheezing.   Cardiovascular: Negative for chest pain, palpitations and leg swelling.  Gastrointestinal: Positive for diarrhea, nausea and vomiting. Negative for abdominal distention, abdominal pain, blood in stool and constipation.  Genitourinary: Negative for difficulty urinating and dysuria.  Musculoskeletal: Negative for arthralgias, joint swelling and myalgias.  Skin: Negative for wound.  Neurological: Positive for dizziness, weakness and light-headedness. Negative for syncope, speech difficulty, numbness and headaches.    Psychiatric/Behavioral: Negative for agitation.     Physical Exam Updated Vital Signs BP 93/64   Pulse 69   Temp 98.1 F (36.7 C) (Oral)   Resp 16   SpO2 97%   Physical Exam  Constitutional: She is oriented to person, place, and time. She appears well-developed and well-nourished. No distress.  HENT:  Head: Normocephalic and atraumatic.  Eyes: Pupils are equal, round, and reactive to light. EOM are normal. Right eye exhibits no discharge. Left eye exhibits no discharge. Right conjunctiva is not injected. Left conjunctiva is not injected. Scleral icterus is present.  Neck: Neck supple.  Cardiovascular: Normal rate and regular rhythm.  Exam reveals no gallop and no friction rub.   No murmur heard. Pulmonary/Chest: Effort normal. No respiratory distress. She has no wheezes. She has no rales.  Abdominal: Soft. Bowel sounds are normal. She exhibits no distension, no abdominal bruit and no pulsatile midline mass. There is no tenderness. There is no rigidity, no guarding, no tenderness at McBurney's point and negative Murphy's sign.  Midline scar extending from the xiphoid process to below the umbilicus  Neurological: She is  alert and oriented to person, place, and time. No cranial nerve deficit or sensory deficit. Coordination normal.  Skin: Skin is warm and dry. She is not diaphoretic.  Psychiatric: She has a normal mood and affect. Her speech is normal and behavior is normal.  Nursing note and vitals reviewed.    ED Treatments / Results  Labs (all labs ordered are listed, but only abnormal results are displayed) Labs Reviewed  COMPREHENSIVE METABOLIC PANEL - Abnormal; Notable for the following:       Result Value   Sodium 134 (*)    Potassium <2.0 (*)    Chloride 98 (*)    Glucose, Bld 129 (*)    BUN <5 (*)    Calcium 8.3 (*)    Total Protein 6.1 (*)    Albumin 2.5 (*)    AST 80 (*)    Alkaline Phosphatase 219 (*)    Total Bilirubin 4.5 (*)    All other components within  normal limits  CBC - Abnormal; Notable for the following:    RBC 3.27 (*)    Hemoglobin 11.8 (*)    HCT 32.5 (*)    MCH 36.1 (*)    MCHC 36.3 (*)    RDW 16.1 (*)    All other components within normal limits  URINALYSIS, ROUTINE W REFLEX MICROSCOPIC - Abnormal; Notable for the following:    Color, Urine AMBER (*)    APPearance HAZY (*)    Leukocytes, UA TRACE (*)    Bacteria, UA FEW (*)    Squamous Epithelial / LPF 0-5 (*)    All other components within normal limits  LIPASE, BLOOD    EKG  EKG Interpretation  Date/Time:  Wednesday November 29 2016 06:06:11 EDT Ventricular Rate:  70 PR Interval:    QRS Duration: 107 QT Interval:  490 QTC Calculation: 529 R Axis:   60 Text Interpretation:  Sinus rhythm Ventricular premature complex Prolonged QT interval Confirmed by Thayer Jew [phone]) on 11/29/2016 6:36:07 AM Also confirmed by Thayer Jew [phone]), editor Hattie Perch ([id number])  on 11/29/2016 7:03:28 AM       Radiology No results found.  Procedures Procedures (including critical care time)  Medications Ordered in ED Medications  potassium chloride 10 mEq in 100 mL IVPB (10 mEq Intravenous New Bag/Given 11/29/16 0747)  sodium chloride 0.9 % bolus 1,000 mL (1,000 mLs Intravenous New Bag/Given 11/29/16 0746)  ondansetron (ZOFRAN-ODT) disintegrating tablet 4 mg (4 mg Oral Given 11/29/16 0408)  magnesium sulfate IVPB 2 g 50 mL (0 g Intravenous Stopped 11/29/16 0651)  potassium chloride SA (K-DUR,KLOR-CON) CR tablet 40 mEq (40 mEq Oral Given 11/29/16 0746)     Initial Impression / Assessment and Plan / ED Course  I have reviewed the triage vital signs and the nursing notes.  Pertinent labs & imaging results that were available during my care of the patient were reviewed by me and considered in my medical decision making (see chart for details).    Ms. Offner presented with nausea/vomiting x 4 days and feeling "lightheaded" today. She has a history of chronic  pancreatitis with multiple surgical stenting procedures involving the pancreas. Workup initiated including CBC, CMP, Lipase, UA.    Patient hypokalemic (potassium <2), replacement initiated with IV Magnesium and IV Potassium infusion. Fluid bolus given for dehydration and soft blood pressure. Patient was given PO potassium after nausea addressed with Zofran. EKG reviewed, showing QT prolongation.  Patient also has an elevated bilirubin (4.5), alk phos (219) and AST (  80). I do not suspect cholangitis at this time as patient does not have a fever and does not have RUQ pain on exam. Do not suspect cholecystitis, as patient reports cholecystectomy in her past. Patient may have an obstructive biliary process going on, but emergently she needs to be treated for hypokalemia and vomiting concerns.   Internal Medicine will admit patient for hypokalemia and vomiting.   Final Clinical Impressions(s) / ED Diagnoses   Final diagnoses:  Hypokalemia  Nausea and vomiting, intractability of vomiting not specified, unspecified vomiting type    New Prescriptions New Prescriptions   No medications on file     Bernarda Caffey 11/30/16 1128    Horton, Barbette Hair, MD 12/05/16 [phone]

## 2016-11-29 NOTE — ED Notes (Addendum)
Report called. Floor protocol states pt potassium needs to be >2 before they are able to accept pt. Labs ordered to recheck K+ value. EDP notified.

## 2016-11-29 NOTE — Evaluation (Signed)
Physical Therapy Evaluation Patient Details Name: Sharon Kent MRN: [id number] DOB: Jun 11, 1960 Today's Date: 11/29/2016   History of Present Illness  Patient is a 56 y/o female admitted with hypokalemia (<2.0 on admit) with prolonged history of pancreatitis and recent ERCP with stents.  She felt weak and reports continued vomiting and decreased appetitie.   Clinical Impression  Patient presents with decreased balance/strength and recent history of falls.  She was able to walk without device, but some instability noted.  Currently refusing walker, but agreed to try cane.  Will continue skilled PT in the acute setting prior to d/c home.     Follow Up Recommendations No PT follow up    Equipment Recommendations  Cane (TBA)    Recommendations for Other Services       Precautions / Restrictions Precautions Precautions: Fall Precaution Comments: reports one recent fall      Mobility  Bed Mobility Overal bed mobility: Modified Independent             General bed mobility comments: increased time  Transfers Overall transfer level: Needs assistance   Transfers: Sit to/from Stand Sit to Stand: Supervision         General transfer comment: for safety due to weakness  Ambulation/Gait Ambulation/Gait assistance: Min guard;Supervision Ambulation Distance (Feet): 130 Feet (x 2) Assistive device: None Gait Pattern/deviations: Step-through pattern;Decreased stride length;Wide base of support;Scissoring     General Gait Details: core weakness evident with ambulation and utilizing quick pace to avoid LOB, minguard at times for safety and occasionally reaches for railing in hallway  Stairs Stairs: Yes Stairs assistance: Supervision Stair Management: One rail Right;Step to pattern Number of Stairs: 3 General stair comments: s for safety, able to demonstrate safe technique, also demonstrated sideways technique with both hands to rail and educated for this technique for  safety  Wheelchair Mobility    Modified Rankin (Stroke Patients Only)       Balance Overall balance assessment: Needs assistance   Sitting balance-Leahy Scale: Good       Standing balance-Leahy Scale: Good                 High Level Balance Comments:  able to balance feet together 30 sec, eyes closed 10 sec, with turning to look over shoulder, able to turn 360 degrees with one episode LOB and minguardA to recover, reaches forward about 7" outside BOS             Pertinent Vitals/Pain Pain Assessment: No/denies pain    Home Living Family/patient expects to be discharged to:: Private residence Living Arrangements: Alone   Type of Home: Apartment Home Access: Stairs to enter Entrance Stairs-Rails: Right Entrance Stairs-Number of Steps: 14 Home Layout: One level Home Equipment: None Additional Comments: Enzo Bi (boss) can help intermittently    Prior Function Level of Independence: Independent         Comments: until about 2 days ago then dizzy and fell once     Hand Dominance        Extremity/Trunk Assessment   Upper Extremity Assessment Upper Extremity Assessment: Generalized weakness    Lower Extremity Assessment Lower Extremity Assessment: RLE deficits/detail;LLE deficits/detail RLE Deficits / Details: AROM WFL, strength hip flexion 2+/5, knee extension 3+/5, ankle DF 4/5 LLE Deficits / Details: AROM WFL, strength hip flexion 2+/5, knee extension 3+/5, ankle DF 4/5       Communication   Communication: No difficulties  Cognition Arousal/Alertness: Awake/alert Behavior During Therapy: WFL for tasks assessed/performed Overall  Cognitive Status: Within Functional Limits for tasks assessed                                        General Comments      Exercises     Assessment/Plan    PT Assessment Patient needs continued PT services  PT Problem List Decreased strength;Decreased balance;Decreased knowledge of use of  DME;Decreased safety awareness;Decreased mobility;Decreased knowledge of precautions;Decreased activity tolerance       PT Treatment Interventions DME instruction;Gait training;Stair training;Balance training;Functional mobility training;Therapeutic exercise;Patient/family education;Therapeutic activities    PT Goals (Current goals can be found in the Care Plan section)  Acute Rehab PT Goals Patient Stated Goal: To return to work PT Goal Formulation: With patient Time For Goal Achievement: 12/02/16 Potential to Achieve Goals: Good    Frequency Min 3X/week   Barriers to discharge        Co-evaluation               AM-PAC PT "6 Clicks" Daily Activity  Outcome Measure Difficulty turning over in bed (including adjusting bedclothes, sheets and blankets)?: None Difficulty moving from lying on back to sitting on the side of the bed? : None Difficulty sitting down on and standing up from a chair with arms (e.g., wheelchair, bedside commode, etc,.)?: A Little Help needed moving to and from a bed to chair (including a wheelchair)?: A Little Help needed walking in hospital room?: A Little Help needed climbing 3-5 steps with a railing? : A Little 6 Click Score: 20    End of Session Equipment Utilized During Treatment: Gait belt Activity Tolerance: Patient limited by fatigue Patient left: in bed;with call bell/phone within reach   PT Visit Diagnosis: Other abnormalities of gait and mobility (R26.89);Muscle weakness (generalized) (M62.81)    Time: 1102-1117 PT Time Calculation (min) (ACUTE ONLY): 25 min   Charges:   PT Evaluation $PT Eval Moderate Complexity: 1 Mod PT Treatments $Gait Training: 8-22 mins   PT G Codes:   PT G-Codes **NOT FOR INPATIENT CLASS** Functional Assessment Tool Used: AM-PAC 6 Clicks Basic Mobility Functional Limitation: Mobility: Walking and moving around Mobility: Walking and Moving Around Current Status (B5670): At least 20 percent but less than 40  percent impaired, limited or restricted Mobility: Walking and Moving Around Goal Status [phone]): At least 1 percent but less than 20 percent impaired, limited or restricted    [place], [place] [phone] 11/29/2016   Elray Mcgregor 11/29/2016, 4:30 PM

## 2016-11-29 NOTE — ED Notes (Signed)
Dr. Wilkie Aye aware of K <2

## 2016-11-29 NOTE — Progress Notes (Signed)
CRITICAL VALUE ALERT  Critical Value:  Potassium 2.7  Date & Time Notied:  11/29/16 at 2020  Provider Notified: Alinda Money, MD  Orders Received/Actions taken:  Md also notified about previous values, pt getting potassium at 28ml/hr unable to tolerate any faster rate due to pain and burning at site. MD notified.

## 2016-11-29 NOTE — ED Provider Notes (Signed)
8:05 AM Patient seen in conjunction with [place] PA-C. Patient with history of hypokalemia, pancreatitis, multiple surgeries to dilate pancreatic duct -- presents with several days of vomiting and diarrhea. She denies fever or abdominal pain today. Typically she will have severe pain in the upper abdomen with previous episodes of pancreatitis. She thinks that this is not her pancreatitis. She states when her potassium gets low she begins to vomit and feel weak.  Potassium in the emergency department found to be less than 2.0. Patient treated with IV and oral potassium, also magnesium. Fluids ordered. No concerning EKG changes.  Discussed patient with internal medicine teaching service who will admit.  BP 93/64   Pulse 69   Temp 98.1 F (36.7 C) (Oral)   Resp 16   SpO2 97%    Exam:Gen NAD; Eyes slight scleral icturus; Heart RRR, nml S1,S2, no m/r/g; Lungs CTAB; Abd soft, NT, no rebound or guarding; Ext 2+ pedal pulses bilaterally, no edema.   Renne Crigler, PA-C 11/29/16 1601    Shon Baton, MD 12/05/16 [phone]

## 2016-11-29 NOTE — ED Notes (Signed)
Admitting MD at bedside.

## 2016-11-30 DIAGNOSIS — Z885 Allergy status to narcotic agent status: Secondary | ICD-10-CM | POA: Diagnosis not present

## 2016-11-30 DIAGNOSIS — R197 Diarrhea, unspecified: Secondary | ICD-10-CM | POA: Diagnosis present

## 2016-11-30 DIAGNOSIS — Z6822 Body mass index (BMI) 22.0-22.9, adult: Secondary | ICD-10-CM | POA: Diagnosis not present

## 2016-11-30 DIAGNOSIS — K219 Gastro-esophageal reflux disease without esophagitis: Secondary | ICD-10-CM | POA: Diagnosis present

## 2016-11-30 DIAGNOSIS — K909 Intestinal malabsorption, unspecified: Secondary | ICD-10-CM | POA: Diagnosis present

## 2016-11-30 DIAGNOSIS — Z79899 Other long term (current) drug therapy: Secondary | ICD-10-CM | POA: Diagnosis not present

## 2016-11-30 DIAGNOSIS — K860 Alcohol-induced chronic pancreatitis: Secondary | ICD-10-CM | POA: Diagnosis present

## 2016-11-30 DIAGNOSIS — E43 Unspecified severe protein-calorie malnutrition: Secondary | ICD-10-CM | POA: Diagnosis present

## 2016-11-30 DIAGNOSIS — K8681 Exocrine pancreatic insufficiency: Secondary | ICD-10-CM | POA: Diagnosis present

## 2016-11-30 DIAGNOSIS — R112 Nausea with vomiting, unspecified: Secondary | ICD-10-CM | POA: Diagnosis present

## 2016-11-30 DIAGNOSIS — K903 Pancreatic steatorrhea: Secondary | ICD-10-CM

## 2016-11-30 DIAGNOSIS — I10 Essential (primary) hypertension: Secondary | ICD-10-CM | POA: Diagnosis present

## 2016-11-30 DIAGNOSIS — F17210 Nicotine dependence, cigarettes, uncomplicated: Secondary | ICD-10-CM | POA: Diagnosis present

## 2016-11-30 DIAGNOSIS — E876 Hypokalemia: Secondary | ICD-10-CM | POA: Diagnosis present

## 2016-11-30 DIAGNOSIS — J45909 Unspecified asthma, uncomplicated: Secondary | ICD-10-CM | POA: Diagnosis present

## 2016-11-30 DIAGNOSIS — E872 Acidosis: Secondary | ICD-10-CM | POA: Diagnosis present

## 2016-11-30 DIAGNOSIS — Z86711 Personal history of pulmonary embolism: Secondary | ICD-10-CM | POA: Diagnosis not present

## 2016-11-30 DIAGNOSIS — Z886 Allergy status to analgesic agent status: Secondary | ICD-10-CM | POA: Diagnosis not present

## 2016-11-30 DIAGNOSIS — Q453 Other congenital malformations of pancreas and pancreatic duct: Secondary | ICD-10-CM | POA: Diagnosis not present

## 2016-11-30 DIAGNOSIS — Z9689 Presence of other specified functional implants: Secondary | ICD-10-CM | POA: Diagnosis present

## 2016-11-30 LAB — CBC
HCT: 25.9 % — ABNORMAL LOW (ref 36.0–46.0)
Hemoglobin: 9.2 g/dL — ABNORMAL LOW (ref 12.0–15.0)
MCH: 35.2 pg — AB (ref 26.0–34.0)
MCHC: 35.5 g/dL (ref 30.0–36.0)
MCV: 99.2 fL (ref 78.0–100.0)
PLATELETS: 161 10*3/uL (ref 150–400)
RBC: 2.61 MIL/uL — AB (ref 3.87–5.11)
RDW: 16.1 % — ABNORMAL HIGH (ref 11.5–15.5)
WBC: 6.8 10*3/uL (ref 4.0–10.5)

## 2016-11-30 LAB — BASIC METABOLIC PANEL
ANION GAP: 7 (ref 5–15)
Anion gap: 7 (ref 5–15)
Anion gap: 10 (ref 5–15)
CALCIUM: 7.3 mg/dL — AB (ref 8.9–10.3)
CALCIUM: 7.6 mg/dL — AB (ref 8.9–10.3)
CALCIUM: 8 mg/dL — AB (ref 8.9–10.3)
CO2: 16 mmol/L — AB (ref 22–32)
CO2: 15 mmol/L — AB (ref 22–32)
CO2: 12 mmol/L — AB (ref 22–32)
CREATININE: 0.71 mg/dL (ref 0.44–1.00)
CREATININE: 0.68 mg/dL (ref 0.44–1.00)
Chloride: 110 mmol/L (ref 101–111)
Chloride: 109 mmol/L (ref 101–111)
Chloride: 112 mmol/L — ABNORMAL HIGH (ref 101–111)
Creatinine, Ser: 0.78 mg/dL (ref 0.44–1.00)
GFR calc Af Amer: >60 mL/min (ref >60)
GFR calc Af Amer: >60 mL/min (ref >60)
GFR calc non Af Amer: >60 mL/min (ref >60)
GFR calc non Af Amer: >60 mL/min (ref >60)
GLUCOSE: 146 mg/dL — AB (ref 65–99)
Glucose, Bld: 115 mg/dL — ABNORMAL HIGH (ref 65–99)
Glucose, Bld: 139 mg/dL — ABNORMAL HIGH (ref 65–99)
POTASSIUM: 2.5 mmol/L — AB (ref 3.5–5.1)
Potassium: 3.3 mmol/L — ABNORMAL LOW (ref 3.5–5.1)
Potassium: 4.6 mmol/L (ref 3.5–5.1)
SODIUM: 134 mmol/L — AB (ref 135–145)
Sodium: 133 mmol/L — ABNORMAL LOW (ref 135–145)
Sodium: 131 mmol/L — ABNORMAL LOW (ref 135–145)

## 2016-11-30 LAB — HEPATIC FUNCTION PANEL
ALT: 28 U/L (ref 14–54)
AST: 72 U/L — AB (ref 15–41)
Albumin: 1.9 g/dL — ABNORMAL LOW (ref 3.5–5.0)
Alkaline Phosphatase: 171 U/L — ABNORMAL HIGH (ref 38–126)
BILIRUBIN DIRECT: 1.6 mg/dL — AB (ref 0.1–0.5)
BILIRUBIN INDIRECT: 1.4 mg/dL — AB (ref 0.3–0.9)
BILIRUBIN TOTAL: 3 mg/dL — AB (ref 0.3–1.2)
Total Protein: 4.5 g/dL — ABNORMAL LOW (ref 6.5–8.1)

## 2016-11-30 LAB — MAGNESIUM: MAGNESIUM: 1.8 mg/dL (ref 1.7–2.4)

## 2016-11-30 MED ORDER — MAGNESIUM SULFATE 2 GM/50ML IV SOLN
2.0000 g | Freq: Once | INTRAVENOUS | Status: AC
  Administered 2016-11-30: 2 g via INTRAVENOUS
  Filled 2016-11-30: qty 50

## 2016-11-30 MED ORDER — POTASSIUM CHLORIDE CRYS ER 20 MEQ PO TBCR
40.0000 meq | EXTENDED_RELEASE_TABLET | Freq: Once | ORAL | Status: AC
  Administered 2016-11-30: 40 meq via ORAL
  Filled 2016-11-30: qty 2

## 2016-11-30 MED ORDER — POTASSIUM CHLORIDE 10 MEQ/100ML IV SOLN
10.0000 meq | INTRAVENOUS | Status: AC
  Administered 2016-11-30 (×3): 10 meq via INTRAVENOUS
  Filled 2016-11-30 (×3): qty 100

## 2016-11-30 MED ORDER — PANCRELIPASE (LIP-PROT-AMYL) 12000-38000 UNITS PO CPEP
48000.0000 [IU] | ORAL_CAPSULE | Freq: Three times a day (TID) | ORAL | Status: DC
Start: 2016-11-30 — End: 2016-12-01
  Administered 2016-11-30 – 2016-12-01 (×3): 48000 [IU] via ORAL
  Filled 2016-11-30 (×3): qty 4

## 2016-11-30 MED ORDER — POTASSIUM CHLORIDE 10 MEQ/100ML IV SOLN
10.0000 meq | INTRAVENOUS | Status: AC
  Administered 2016-11-30 (×4): 10 meq via INTRAVENOUS
  Filled 2016-11-30 (×4): qty 100

## 2016-11-30 NOTE — Progress Notes (Signed)
Subjective: Sharon Kent is doing okay this morning. She has been continuing to receive IV potassium, with improvement in her potassium to 3.3 this morning.  She has had decreased appetite and early satiety for 5 months and weight loss of ~50-60lbs since January. She has had N/V for ~4 months. She feels nauseous at random times throughout the day, not necessarily after eating. She has ~1-2 episodes of emesis per day. She also has had diarrhea for the last 4 days. Her BMs are loose and were brown, but yesterday were green in color. She does take Creon three times a day with meals but she has not been taking this recently because she hasn't been eating as much.  Objective:  Vital signs in last 24 hours: Vitals:   11/29/16 1323 11/29/16 2143 11/30/16 0500 11/30/16 0600  BP: 112/77 119/75  120/84  Pulse: 72 81  85  Resp: 15 18  16   Temp: 98.2 F (36.8 C) 97.7 F (36.5 C)  98.4 F (36.9 C)  TempSrc: Oral Oral  Oral  SpO2: 98% 100%  99%  Weight: 131 lb 11.2 oz (59.7 kg)  140 lb 8 oz (63.7 kg)   Height: 5\' 6"  (1.676 m)      GEN: Appears older than stated age. Lying in bed in no acute distress. Alert and oriented. RESP: Clear to auscultation bilaterally. No wheezes, rales, or rhonchi. CV: Normal rate and regular rhythm. No murmurs, gallops, or rubs. No LE edema. ABD: Soft. Slightly tender to palpation in left upper quadrant and right lower quadrant. Nontender in other quadrants. Non-distended. Normoactive bowel sounds. EXT: No edema. 2+ DP pulses bilaterally. NEURO: 5/5 strength in BLE and BUE.  Labs CBC Latest Ref Rng & Units 11/30/2016 11/29/2016 11/29/2016  WBC 4.0 - 10.5 K/uL 6.8 - 8.3  Hemoglobin 12.0 - 15.0 g/dL 6.7(T) 11.6(L) 11.8(L)  Hematocrit 36.0 - 46.0 % 25.9(L) 34.0(L) 32.5(L)  Platelets 150 - 400 K/uL 161 - 239   CMP Latest Ref Rng & Units 11/30/2016 11/30/2016 11/29/2016  Glucose 65 - 99 mg/dL 245(Y) 099(I) 338(S)  BUN 6 - 20 mg/dL <5(K) <5(L) <9(J)  Creatinine 0.44 - 1.00  mg/dL 6.73 4.19 3.79  Sodium 135 - 145 mmol/L 131(L) 133(L) 136  Potassium 3.5 - 5.1 mmol/L 3.3(L) 2.5(LL) 2.7(LL)  Chloride 101 - 111 mmol/L 109 110 109  CO2 22 - 32 mmol/L 15(L) 16(L) 15(L)  Calcium 8.9 - 10.3 mg/dL 7.6(L) 7.3(L) 7.7(L)  Total Protein 6.5 - 8.1 g/dL - 4.5(L) -  Total Bilirubin 0.3 - 1.2 mg/dL - 3.0(H) -  Alkaline Phos 38 - 126 U/L - 171(H) -  AST 15 - 41 U/L - 72(H) -  ALT 14 - 54 U/L - 28 -   Mg 1.8  Assessment/Plan:  Principal Problem:   Hypokalemia Active Problems:   Chronic pancreatitis (HCC)   Asthma   Hypertension  Ms. Bayes is a 56 year old woman with PMH of Chronic Pancreatitis, HTN, GERD, and asthma who is admitted with potassium <2.0, now improved to 3.3 after multiple IV and PO supplements of K.  Hypokalemia with NAGMA K improved to 3.3. She is receiving PO K TID and has 1 run of IV remaining. Mg 1.8. Bicarb low at 15. Cl normal at 109. Her strength is improved this morning and was likely related to her hypokalemia. DDx: decreased dietary intake vs GI losses (diarrhea/vomiting) vs urinary losses (RTA) Ms. Nassar does note she has had reduced dietary intake for several months.  However, this is unlikely to be the sole cause of her low potassium. She also has had vomiting and diarrhea, which may also be contributing. Diarrhea can cause a non-anion gap metabolic acidosis, however RTA is another etiology to consider. RTAs are much more rare and she does have other likely reasons to be hypokalemic, however she has never had a work-up of this hypokalemia and is something to consider. - Replete K & Mg as needed - Nutrition and PT consulted, appreciate their assistance - Restart home dose of Creon - Urinary K:Cr ratio studies when able (given she is being repleted with IV K)  Diarrhea Patient states she was not taking her home dose of Creon due to decreased intake and given that her stools seem to float in the toilet bowl and she has a  significant history of chronic pancreatitis, my main concern is malabsorption. - Restart home dose of Creon  Chronic Nausea/Emesis, Weight Loss, Decreased Appetite DDx: gastroparesis vs malignancy Her weight loss may be related to malabsorption. However, her chronic N/V and decreased appetite do not quite fit this picture. Patient does not have a history of diabetes and her BGs have been in the low 100s during his hospitalization. However, her symptoms are consistent with possible gastroparesis. We will evaluate this further with gastric emptying study. Another consideration is an underlying malignancy. She has chronic pancreatitis, which increases the risk of pancreatic cancer. However, CT abdomen/pelvis in May 2018 did not show evidence of a mass in the pancreas. She is a smoker and thus is at risk for lung cancer. Her outpatient PCP has scheduled her for chest CT. We will plan on working her up for possible gastroparesis as a better explanation of her symptoms and allow her to follow up outpatient with the scheduled CT. 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 She does have poor appetite with significantly decreased intake and weight loss of nearly 50 lbs over the last 7 months. This is likely related to her chronic pancreatitis, however this would also be concerning for underlying malignancy. She currently is not having any abdominal pain. Her AST/ALT is elevated in 2:1 ratio and Tbili is also elevated at 3.0. She is s/p cholecystectomy. KUB yesterday shows passage of the pigtail stent. - Resume home Creon - Nutrition consulted - Phenergan prn, Metoclopramide PRN, Pantoprazole -  Home Tramadol prn pain - Consider CT Chest and/or Abdomen to assess for underlying malignancy  Dispo: Anticipated discharge, pending clinical work-up  Scherrie Gerlach, MD  Internal Medicine, PGY-1 11/30/2016, 11:21 AM P [phone]

## 2016-11-30 NOTE — Progress Notes (Signed)
Initial Nutrition Assessment  DOCUMENTATION CODES:   Severe malnutrition in context of chronic illness  INTERVENTION:  1. Continue Boost Breeze po TID, each supplement provides 250 kcal and 9 grams of protein 2. Continue MVI w/ Minerals 3. Magic cup TID with meals, each supplement provides 290 kcal and 9 grams of protein 4. Recommend 100mg  Thiamine PO daily  Encouraged and discussed high protein, high calorie liquid options. Encouraged intake of Ice cream, and Ensure. Hopefully she can get some calories from those, but patient has many taste aversions and also reports rarely having an appetite. Does not seem to like boost or ensure.   NUTRITION DIAGNOSIS:    (Severe) related to chronic illness as evidenced by energy intake < 75% for > or equal to 1 month.  GOAL:   Patient will meet greater than or equal to 90% of their needs  MONITOR:   PO intake, I & O's, Labs, Supplement acceptance, Weight trends  REASON FOR ASSESSMENT:   Consult Assessment of nutrition requirement/status  ASSESSMENT:   Sharon Kent has a PMHHx of Chronic Pancreatitis, HTN, GERD, presents with progressive weakness. 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 Had pancreatic divisum s/p sphincterotomy with 2 stents in March 2018, removed 10/2016 with placement of stent in dorsal pancreatic duct.   Spoke with patient at bedside. She reports poor PO intake x3 months often doesn't eat much. Says "maybe I'll grab a panini or something like that if I get hungry." Patient reports she rarely gets hungry and doesn't like most foods.  Denies weight loss, but exhibits a 29 pound/17.2% severe wt loss over 5 months with a total 50 pound weight loss since January 2018. She has been drinking V8 Juice at home to get her vitamins and nutrients but otherwise is not consuming many calories. Also reports regurgitation of some foods 15 mins after swallowing  which may play a large role in her taste and food aversions. Significant nausea and vomiting daily.  Work-up for gastroparesis will be done during stay. Could fit with nausea,vomiting, regurgitation, poor appetite.  atient likely needs nutrition support if malignancy is the case given her considerable weight loss up to this point. She does not appear malnourished upon physical examination though she does meet the criteria for it based on her PO intake and weight loss.  24-48 hour initiation of enteral nutrition improves outcomes for acute severe pancreatitis but it is unclear if it is severe.  NO UOP documented  Labs reviewed:  Na 131, K 3.3, TBili 3.0  Medications reviewed and include:  Creon 48000 Units TID 20 K+ TID PO, 10 K+ every 2 hours IV Reglan  Diet Order:  Diet clear liquid Room service appropriate? Yes; Fluid consistency: Thin  Skin:  Wound (see comment) (Abrasion to Knee, Ecchymosis to Arm/LEG)  Last BM:  11/30/2016 (type 7)  Height:   Ht Readings from Last 1 Encounters:  11/29/16 5\' 6"  (1.676 m)    Weight:   Wt Readings from Last 1 Encounters:  11/30/16 140 lb 8 oz (63.7 kg)    Ideal Body Weight:  59.09 kg  BMI:  Body mass index is 22.68 kg/m.  Estimated Nutritional Needs:   Kcal:  1610-9604 calories  Protein:  96-108 grams (1.5-1.7g/kg)  Fluid:  1.9-2.2L  EDUCATION NEEDS:   Education needs addressed  Dionne Ano. Lanijah Warzecha, MS, RD LDN Inpatient Clinical Dietitian Pager [phone]

## 2016-11-30 NOTE — Progress Notes (Signed)
CRITICAL VALUE ALERT  Critical Value:  Potassium 2.5  Date & Time Notied:  11/30/16 at 0300  Provider Notified: Mitchell Heir, MD  Orders Received/Actions taken: New orders for IV and PO potassium placed

## 2016-11-30 NOTE — Progress Notes (Signed)
Physical Therapy Treatment Patient Details Name: Sharon Kent MRN: [id number] DOB: Aug 17, 1960 Today's Date: 11/30/2016    History of Present Illness Patient is a 56 y/o female admitted with hypokalemia (<2.0 on admit) with prolonged history of pancreatitis and recent ERCP with stents.  She felt weak and reports continued vomiting and decreased appetitie.     PT Comments    Pt not feeling well but agreeable to gait training with cane. Practiced with cane, however, pt with difficulty sequencing and reports she feels more unsteady, and refused to use. Practiced without and pt requiring supervision to occasional min A secondary to mild LOB. Reviewed supine exercise program with pt as well, however, did not want to perform secondary to not feeling well. Current recommendations appropriate. Will continue to follow acutely to maximize functional mobility independence and safety.    Follow Up Recommendations  No PT follow up     Equipment Recommendations  None recommended by PT (Pt refusing cane )    Recommendations for Other Services       Precautions / Restrictions Precautions Precautions: Fall Restrictions Weight Bearing Restrictions: No    Mobility  Bed Mobility Overal bed mobility: Modified Independent             General bed mobility comments: increased time  Transfers Overall transfer level: Needs assistance Equipment used: None Transfers: Sit to/from Stand Sit to Stand: Supervision         General transfer comment: for safety due to weakness  Ambulation/Gait Ambulation/Gait assistance: Min guard;Supervision Ambulation Distance (Feet): 200 Feet Assistive device: Straight cane;None Gait Pattern/deviations: Step-through pattern;Decreased stride length;Wide base of support;Scissoring Gait velocity: WFL Gait velocity interpretation: at or above normal speed for age/gender General Gait Details: Practiced gait training with cane, however, pt having difficulty  with sequencing and reports she feels more unsteady with its use. Practiced remainder of gait without AD. Pt requiring supervision to occasional Min A secondary to decreased balance. Not reaching for railing as much. Unsteadiness during turns and educated to take time during turns.    Stairs            Wheelchair Mobility    Modified Rankin (Stroke Patients Only)       Balance Overall balance assessment: Needs assistance Sitting-balance support: No upper extremity supported;Feet supported Sitting balance-Leahy Scale: Good     Standing balance support: No upper extremity supported Standing balance-Leahy Scale: Good                              Cognition Arousal/Alertness: Awake/alert Behavior During Therapy: WFL for tasks assessed/performed Overall Cognitive Status: Within Functional Limits for tasks assessed                                        Exercises      General Comments General comments (skin integrity, edema, etc.): Pt reporting some SOB after gait training, oxygen sats checked and at 96%. Educated about performing HEP to ensure strengthening of LE, however, pt refusing to perform because of not feeling well.       Pertinent Vitals/Pain Pain Assessment: No/denies pain    Home Living                      Prior Function            PT Goals (current goals  can now be found in the care plan section) Acute Rehab PT Goals Patient Stated Goal: To return to work PT Goal Formulation: With patient Time For Goal Achievement: 12/02/16 Potential to Achieve Goals: Good Progress towards PT goals: Progressing toward goals    Frequency    Min 3X/week      PT Plan Current plan remains appropriate    Co-evaluation              AM-PAC PT "6 Clicks" Daily Activity  Outcome Measure  Difficulty turning over in bed (including adjusting bedclothes, sheets and blankets)?: None Difficulty moving from lying on back to  sitting on the side of the bed? : None Difficulty sitting down on and standing up from a chair with arms (e.g., wheelchair, bedside commode, etc,.)?: A Little Help needed moving to and from a bed to chair (including a wheelchair)?: A Little Help needed walking in hospital room?: A Little Help needed climbing 3-5 steps with a railing? : A Little 6 Click Score: 20    End of Session Equipment Utilized During Treatment: Gait belt Activity Tolerance: Patient limited by fatigue Patient left: in bed;with call bell/phone within reach Nurse Communication: Mobility status PT Visit Diagnosis: Other abnormalities of gait and mobility (R26.89);Muscle weakness (generalized) (M62.81)     Time: 7829-5621 PT Time Calculation (min) (ACUTE ONLY): 15 min  Charges:  $Gait Training: 8-22 mins                    G Codes:       Gladys Damme, PT, DPT  Acute Rehabilitation Services  Pager: [phone]    Lehman Prom 11/30/2016, 4:31 PM

## 2016-12-01 ENCOUNTER — Inpatient Hospital Stay (HOSPITAL_COMMUNITY): Payer: Managed Care, Other (non HMO)

## 2016-12-01 LAB — BASIC METABOLIC PANEL
Anion gap: 7 (ref 5–15)
CHLORIDE: 114 mmol/L — AB (ref 101–111)
CO2: 15 mmol/L — AB (ref 22–32)
CREATININE: 0.62 mg/dL (ref 0.44–1.00)
Calcium: 8 mg/dL — ABNORMAL LOW (ref 8.9–10.3)
GFR calc Af Amer: >60 mL/min (ref >60)
GFR calc non Af Amer: >60 mL/min (ref >60)
Glucose, Bld: 106 mg/dL — ABNORMAL HIGH (ref 65–99)
Potassium: 4.7 mmol/L (ref 3.5–5.1)
Sodium: 136 mmol/L (ref 135–145)

## 2016-12-01 LAB — NA AND K (SODIUM & POTASSIUM), RAND UR
Potassium Urine: 36 mmol/L
SODIUM UR: 86 mmol/L

## 2016-12-01 LAB — MAGNESIUM: Magnesium: 1.7 mg/dL (ref 1.7–2.4)

## 2016-12-01 LAB — CREATININE, URINE, RANDOM: Creatinine, Urine: 41.2 mg/dL

## 2016-12-01 MED ORDER — TECHNETIUM TC 99M SULFUR COLLOID
2.0000 | Freq: Once | INTRAVENOUS | Status: AC | PRN
  Administered 2016-12-01: 2 via ORAL

## 2016-12-01 NOTE — Progress Notes (Signed)
Physical Therapy Treatment Patient Details Name: Sharon Kent MRN: [id number] DOB: [birth year] Today's Date: 12/01/2016    History of Present Illness Patient is a 56 y/o female admitted with hypokalemia (<2.0 on admit) with prolonged history of pancreatitis and recent ERCP with stents.  She felt weak and reports continued vomiting and decreased appetitie.     PT Comments    Much improved stability with gait.  DGI scored at 22/24 corresponding to a low risk of falling.    Follow Up Recommendations  No PT follow up     Equipment Recommendations  None recommended by PT    Recommendations for Other Services       Precautions / Restrictions Restrictions Weight Bearing Restrictions: No    Mobility  Bed Mobility Overal bed mobility: Modified Independent                Transfers Overall transfer level: Modified independent                  Ambulation/Gait Ambulation/Gait assistance: Supervision Ambulation Distance (Feet): 500 Feet Assistive device: None Gait Pattern/deviations: Step-through pattern Gait velocity: WFL Gait velocity interpretation: at or above normal speed for age/gender General Gait Details: steady even with challenges doing a DGI   Stairs Stairs: Yes   Stair Management: One rail Right;Step to pattern Number of Stairs: 10 General stair comments: moderate effort to climb stairs, safe with rail  Wheelchair Mobility    Modified Rankin (Stroke Patients Only)       Balance     Sitting balance-Leahy Scale: Good       Standing balance-Leahy Scale: Good                   Standardized Balance Assessment Standardized Balance Assessment : Dynamic Gait Index   Dynamic Gait Index Level Surface: Normal Change in Gait Speed: Normal Gait with Horizontal Head Turns: Normal Gait with Vertical Head Turns: Normal Gait and Pivot Turn: Normal Step Over Obstacle: Mild Impairment Step Around Obstacles: Normal Steps: Mild  Impairment Total Score: 22      Cognition Arousal/Alertness: Awake/alert Behavior During Therapy: WFL for tasks assessed/performed Overall Cognitive Status: Within Functional Limits for tasks assessed                                        Exercises      General Comments General comments (skin integrity, edema, etc.): minimal risk of falls based on DGI scoring      Pertinent Vitals/Pain Pain Assessment: No/denies pain    Home Living                      Prior Function            PT Goals (current goals can now be found in the care plan section) Acute Rehab PT Goals Patient Stated Goal: To return to work PT Goal Formulation: With patient Time For Goal Achievement: 12/02/16 Potential to Achieve Goals: Good Progress towards PT goals: Progressing toward goals    Frequency    Min 3X/week      PT Plan Current plan remains appropriate    Co-evaluation              AM-PAC PT "6 Clicks" Daily Activity  Outcome Measure  Difficulty turning over in bed (including adjusting bedclothes, sheets and blankets)?: None Difficulty moving from lying on back to sitting  on the side of the bed? : None Difficulty sitting down on and standing up from a chair with arms (e.g., wheelchair, bedside commode, etc,.)?: None Help needed moving to and from a bed to chair (including a wheelchair)?: None Help needed walking in hospital room?: A Little Help needed climbing 3-5 steps with a railing? : A Little 6 Click Score: 22    End of Session   Activity Tolerance: Patient tolerated treatment well Patient left: in bed;with call bell/phone within reach;Other (comment) (sitting EOB) Nurse Communication: Mobility status PT Visit Diagnosis: Other abnormalities of gait and mobility (R26.89);Muscle weakness (generalized) (M62.81)     Time: 1610-9604 PT Time Calculation (min) (ACUTE ONLY): 12 min  Charges:  $Gait Training: 8-22 mins                    G  Codes:       12-29-16  [place] Bing, PT [phone]  (pager)   Eliseo Gum Leniyah Martell 12/29/2016, 2:52 PM

## 2016-12-01 NOTE — Discharge Instructions (Signed)
Sharon Kent,  Please take your Creon three times a day, preferably with meals. If you do not eat 3 meals in a day, take your Creon three times a day with whatever food you do take. Please come to the clinic downstairs for a follow-up appointment on Thursday, August 30 at 10:45am.

## 2016-12-01 NOTE — Discharge Summary (Signed)
Name: Sharon Kent MRN: [id number] DOB: February 04, 1961 56 y.o. PCP: Gust Rung, DO  Date of Admission: 11/29/2016  5:22 AM Date of Discharge: 12/01/2016 Attending Physician: Doneen Poisson, MD  Discharge Diagnosis:  1. Hypokalemia  Principal Problem:   Hypokalemia Active Problems:   Chronic pancreatitis (HCC)   Asthma   Hypertension   Nausea and vomiting   Pancreatic steatorrhea   Severe protein-calorie malnutrition (HCC)   Discharge Medications: Allergies as of 12/01/2016      Reactions   Morphine Nausea Only   Oxycodone-acetaminophen Other (See Comments)   Makes her "loopy."      Medication List    STOP taking these medications   ondansetron 4 MG disintegrating tablet Commonly known as:  ZOFRAN-ODT     TAKE these medications   Albuterol Sulfate 108 (90 Base) MCG/ACT Aepb Inhale 2 puffs into the lungs as needed. What changed:  when to take this  reasons to take this   multivitamin with minerals tablet Take 1 tablet by mouth daily.   Pancrelipase (Lip-Prot-Amyl) 24000-76000 units Cpep Take 2 capsules (48,000 Units total) by mouth 3 (three) times daily with meals.   pantoprazole 40 MG tablet Commonly known as:  PROTONIX Take 1 tablet (40 mg total) by mouth daily.   potassium chloride SA 20 MEQ tablet Commonly known as:  K-DUR,KLOR-CON Take 2 tablets (40 mEq total) by mouth daily. What changed:  how much to take   traMADol 50 MG tablet Commonly known as:  ULTRAM Take 1 tablet (50 mg total) by mouth every 8 (eight) hours as needed for moderate pain.            Discharge Care Instructions        Start     Ordered   12/01/16 0000  Increase activity slowly     12/01/16 1354   12/01/16 0000  Diet - low sodium heart healthy     12/01/16 1354   12/01/16 0000  Call MD for:  persistant nausea and vomiting     12/01/16 1354   12/01/16 0000  Call MD for:  severe uncontrolled pain     12/01/16 1354   12/01/16 0000  Call MD for:  extreme  fatigue     12/01/16 1354   12/01/16 0000  Call MD for:  difficulty breathing, headache or visual disturbances     12/01/16 1354   12/01/16 0000  Call MD for:  temperature >100.4     12/01/16 1354      Disposition and follow-up:   [name] was discharged from [facility] in Stable condition.  At the hospital follow up visit please address:  1.  Hypokalemia. She presented with weakness which improved with correction of her low potassium. How is her strength? Please follow up urinary K:Cr ratio and consider further work-up depending on the results.  2.  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 She was taking less of her Creon than prescribed because she said she wasn't eating as much and therefore wasn't taking her Creon with meals. I suspect the malabsorption is leading to her steatorrhea and some of her weight loss. We asked her to take Creon three times a day, preferably with meals but if she does not eat meals, then with whatever food she does take in three times a day. Is she taking her Creon TID? How is her diarrhea?  3.  Chronic nausea/vomiting and decreased appetite. Gastric emptying study was normal. This may have been related  to her hypokalemia. How is her appetite now?  4.  Labs / imaging needed at time of follow-up: BMP for potassium level  5.  Pending labs/ test needing follow-up: Urinary K:Cr ratio  Follow-up Appointments: Follow-up Information    Gust Rung, DO. Go on 12/07/2016.   Specialty:  Internal Medicine Why:  at 10:45am. Contact information: [address] [place] [address] [phone]           Hospital Course by problem list: Principal Problem:   Hypokalemia Active Problems:   Chronic pancreatitis (HCC)   Asthma   Hypertension   Nausea and vomiting   Pancreatic steatorrhea   Severe protein-calorie malnutrition (HCC)   1. Hypokalemia Sharon Kent presented on 8/22 with weakness, found to  have potassium <2.0. She received PO and IV supplementation, with improvement in her potassium. Her strength improved and was likely related to her hypokalemia.  She reports decreased intake of food for the last several months. However, reduced dietary intake of potassium is unlikely to be the sole cause of her low potassium. She also described a history of emesis for the last few months and diarrhea for the last few days, which are likely contributory factors to her low K. We are evaluating for possible GI vs urinary loss of potassium with urinary K:Cr ratio, which was pending at the time of discharge. Potassium was stable at 4.7 at the time of discharge.  2. Diarrhea with weight loss, likely secondary to malabsorption in setting of exocrine pancreatic insufficiency Sharon Kent reports steatorrhea for the last four days. She has a history of chronic pancreatitis and typically takes Creon at home with meals. However, she reports that for the last few months, she has had less of an appetite, so she has not taken her Creon as much. I suspect the diarrhea is related to decreased Creon dose, leading to malabsorption and subsequent weight loss. We resumed her home Creon dose during this hospitalization and emphasized the importance of taking Creon three times a day with whatever food she does take in.  3. Chronic nausea/emesis, decreased appetite Gastric emptying study was normal. It is possible that this may have been related to hypokalemia causing weakness of GI muscles. Her nausea and appetite had improved at the time of discharge.  Discharge Vitals:   BP 119/85 (BP Location: Left Arm)   Pulse 84   Temp 97.8 F (36.6 C) (Oral)   Resp 16   Ht 5\' 6"  (1.676 m)   Wt 140 lb 8 oz (63.7 kg)   SpO2 98%   BMI 22.68 kg/m   Pertinent Labs, Studies, and Procedures:  CBC Latest Ref Rng & Units 11/30/2016 11/29/2016 11/29/2016  WBC 4.0 - 10.5 K/uL 6.8 - 8.3  Hemoglobin 12.0 - 15.0 g/dL 0.8(M) 11.6(L) 11.8(L)    Hematocrit 36.0 - 46.0 % 25.9(L) 34.0(L) 32.5(L)  Platelets 150 - 400 K/uL 161 - 239   CMP Latest Ref Rng & Units 12/01/2016 11/30/2016 11/30/2016  Glucose 65 - 99 mg/dL 578(I) 696(E) 952(W)  BUN 6 - 20 mg/dL <4(X) <3(K) <4(M)  Creatinine 0.44 - 1.00 mg/dL 0.10 2.72 5.36  Sodium 135 - 145 mmol/L 136 134(L) 131(L)  Potassium 3.5 - 5.1 mmol/L 4.7 4.6 3.3(L)  Chloride 101 - 111 mmol/L 114(H) 112(H) 109  CO2 22 - 32 mmol/L 15(L) 12(L) 15(L)  Calcium 8.9 - 10.3 mg/dL 8.0(L) 8.0(L) 7.6(L)  Total Protein 6.5 - 8.1 g/dL - - -  Total Bilirubin 0.3 - 1.2 mg/dL - - -  Alkaline Phos 38 - 126 U/L - - -  AST 15 - 41 U/L - - -  ALT 14 - 54 U/L - - -   Lipase 26 UA negative for infection Mg 1.7  Gastric emptying study 8/24 Normal gastric emptying study.  Discharge Instructions: Discharge Instructions    Call MD for:  difficulty breathing, headache or visual disturbances    Complete by:  As directed    Call MD for:  extreme fatigue    Complete by:  As directed    Call MD for:  persistant nausea and vomiting    Complete by:  As directed    Call MD for:  severe uncontrolled pain    Complete by:  As directed    Call MD for:  temperature >100.4    Complete by:  As directed    Diet - low sodium heart healthy    Complete by:  As directed    Increase activity slowly    Complete by:  As directed      Signed: Scherrie Gerlach, MD 12/01/2016, 2:05 PM   P [phone]

## 2016-12-01 NOTE — Progress Notes (Signed)
Subjective: Ms. Crotzer is doing well this morning. Potassium stable. She feels hungry. Her diarrhea and vomiting have improved. Denies abdominal pain.  Gastric emptying study this morning was normal.   Objective:  Vital signs in last 24 hours: Vitals:   11/30/16 0600 11/30/16 1500 11/30/16 2105 12/01/16 0529  BP: 120/84 111/90 123/77 119/85  Pulse: 85 91 86 84  Resp: 16 18 18 16   Temp: 98.4 F (36.9 C) 98.7 F (37.1 C) 97.8 F (36.6 C) 97.8 F (36.6 C)  TempSrc: Oral Oral Oral Oral  SpO2: 99% 99% 100% 98%  Weight:      Height:       GEN: Appears older than stated age. Sitting up in bed in NAD. Alert and oriented. CV: Normal rate and regular rhythm. No murmurs, gallops, or rubs.  ABD: Soft. Non-tender. Normoactive bowel sounds.  Labs CBC Latest Ref Rng & Units 11/30/2016 11/29/2016 11/29/2016  WBC 4.0 - 10.5 K/uL 6.8 - 8.3  Hemoglobin 12.0 - 15.0 g/dL 9.3(T) 11.6(L) 11.8(L)  Hematocrit 36.0 - 46.0 % 25.9(L) 34.0(L) 32.5(L)  Platelets 150 - 400 K/uL 161 - 239   CMP Latest Ref Rng & Units 12/01/2016 11/30/2016 11/30/2016  Glucose 65 - 99 mg/dL 342(A) 768(T) 157(W)  BUN 6 - 20 mg/dL <6(O) <0(B) <5(D)  Creatinine 0.44 - 1.00 mg/dL 9.74 1.63 8.45  Sodium 135 - 145 mmol/L 136 134(L) 131(L)  Potassium 3.5 - 5.1 mmol/L 4.7 4.6 3.3(L)  Chloride 101 - 111 mmol/L 114(H) 112(H) 109  CO2 22 - 32 mmol/L 15(L) 12(L) 15(L)  Calcium 8.9 - 10.3 mg/dL 8.0(L) 8.0(L) 7.6(L)  Total Protein 6.5 - 8.1 g/dL - - -  Total Bilirubin 0.3 - 1.2 mg/dL - - -  Alkaline Phos 38 - 126 U/L - - -  AST 15 - 41 U/L - - -  ALT 14 - 54 U/L - - -   Mg 1.7  Assessment/Plan:  Principal Problem:   Hypokalemia Active Problems:   Chronic pancreatitis (HCC)   Asthma   Hypertension   Nausea and vomiting   Pancreatic steatorrhea   Severe protein-calorie malnutrition (HCC)  Ms. Morishita is a 56 year old woman with PMH of Chronic Pancreatitis, HTN, GERD, and asthma who was admitted with potassium <2.0, now  stable at 4.7 after multiple IV and PO supplements of K. Her symptoms have improved and she feels hungry. I suspect that her symptoms are related to her hypokalemia in the setting of exocrine pancreatic insufficiency.  Hypokalemia with NAGMA, now stable K stable at 4.7 after supplementation. Her strength is improved, which is likely related to her hypokalemia. DDx: decreased dietary intake vs GI losses (diarrhea/vomiting) vs urinary losses (RTA) We are still awaiting urine studies to help distinguish between GI and urinary losses of potassium. However, she is stable for discharge with close follow up with her PCP next week. - Replete K & Mg as needed - Nutrition and PT consulted, appreciate their assistance - Restart home dose of Creon - Urinary K:Cr ratio studies when able - discharge home today - f/u with PCP Thursday Aug 30  Diarrhea, likely related to exocrine pancreatic insufficiency, improved Patient states she was not taking her home dose of Creon due to decreased intake and given that her stools seem to float in the toilet bowl and she has a significant history of chronic pancreatitis, my main concern is malabsorption. We emphasized the importance of taking Creon three times a day with whatever food she does take in. -  home dose of Creon  Chronic Nausea/Emesis, Weight Loss, Decreased Appetite Gastric emptying study was normal, ruling out gastroparesis. It is possible that some of her nausea, emesis, and early satiety is related to hypokalemia. Hypokalemia that causes weakness of GI muscles can create similar symptoms. We will continue to search for the etiology of her hypokalemia. In the meantime, it is important for [name] to ensure that her malabsorption of exocrine pancreatic insufficiency is appropriately treated. We will allow her PCP to follow up with her outpatient CT chest regarding possible malignancy. 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

## 2016-12-01 NOTE — Progress Notes (Signed)
Earnestine Mealing to be D/C'd Home per MD order.  Discussed with the patient and all questions fully answered.  VSS, Skin clean, dry and intact without evidence of skin break down, no evidence of skin tears noted. IV catheter discontinued intact. Site without signs and symptoms of complications. Dressing and pressure applied.  An After Visit Summary was printed and given to the patient. Patient received prescription.  D/c education completed with patient/family including follow up instructions, medication list, d/c activities limitations if indicated, with other d/c instructions as indicated by MD - patient able to verbalize understanding, all questions fully answered.   Patient instructed to return to ED, call 911, or call MD for any changes in condition.   Patient escorted via WC, and D/C home via private auto.  Eligah East 12/01/2016 3:39 PM

## 2016-12-03 ENCOUNTER — Encounter: Payer: Self-pay | Admitting: Internal Medicine

## 2016-12-07 ENCOUNTER — Ambulatory Visit (INDEPENDENT_AMBULATORY_CARE_PROVIDER_SITE_OTHER): Payer: Managed Care, Other (non HMO) | Admitting: Internal Medicine

## 2016-12-07 VITALS — BP 123/83 | HR 91 | Temp 97.5°F | Ht 66.0 in | Wt 139.1 lb

## 2016-12-07 DIAGNOSIS — K7460 Unspecified cirrhosis of liver: Secondary | ICD-10-CM

## 2016-12-07 DIAGNOSIS — I868 Varicose veins of other specified sites: Secondary | ICD-10-CM

## 2016-12-07 DIAGNOSIS — E876 Hypokalemia: Secondary | ICD-10-CM | POA: Diagnosis not present

## 2016-12-07 DIAGNOSIS — I81 Portal vein thrombosis: Secondary | ICD-10-CM

## 2016-12-07 DIAGNOSIS — R8299 Other abnormal findings in urine: Secondary | ICD-10-CM

## 2016-12-07 DIAGNOSIS — R1011 Right upper quadrant pain: Secondary | ICD-10-CM

## 2016-12-07 DIAGNOSIS — R1013 Epigastric pain: Secondary | ICD-10-CM

## 2016-12-07 DIAGNOSIS — Z79899 Other long term (current) drug therapy: Secondary | ICD-10-CM

## 2016-12-07 DIAGNOSIS — R1012 Left upper quadrant pain: Secondary | ICD-10-CM | POA: Diagnosis not present

## 2016-12-07 DIAGNOSIS — D735 Infarction of spleen: Secondary | ICD-10-CM | POA: Diagnosis not present

## 2016-12-07 DIAGNOSIS — K769 Liver disease, unspecified: Secondary | ICD-10-CM

## 2016-12-07 DIAGNOSIS — Z5189 Encounter for other specified aftercare: Secondary | ICD-10-CM | POA: Diagnosis not present

## 2016-12-07 DIAGNOSIS — K861 Other chronic pancreatitis: Secondary | ICD-10-CM | POA: Diagnosis not present

## 2016-12-07 DIAGNOSIS — Z9049 Acquired absence of other specified parts of digestive tract: Secondary | ICD-10-CM

## 2016-12-07 DIAGNOSIS — K439 Ventral hernia without obstruction or gangrene: Secondary | ICD-10-CM | POA: Diagnosis not present

## 2016-12-07 DIAGNOSIS — R1010 Upper abdominal pain, unspecified: Secondary | ICD-10-CM | POA: Insufficient documentation

## 2016-12-07 NOTE — Assessment & Plan Note (Addendum)
Her hypokalemia responded well to replacement therapy. Her urinary potassium to creatinine ratio is less than 1.5-which suggest lower GI losses or a laxative abuse.  -Repeat electrolytes today.

## 2016-12-07 NOTE — Progress Notes (Signed)
   CC: For hospital follow-up, she was admitted for hypokalemia.  HPI:  [name] is a 56 y.o. lady with past medical history as listed below came to the clinic for her hospital follow-up. She was admitted at [facility] from 11/29/2016 till 12/01/2016 because of severe generalized weakness, found to have hypokalemia with potassium less than 2. She was treated with potassium replacement, her potassium improved appropriately and she was discharged home on potassium supplement. Since discharge her appetite and weakness is improving. She continued to get mild intermittent nausea. She is taking her Creon regularly. She was complaining of upper abdominal pain, nonradiating, described it as numbness and continuous aching. She denies any relationship with food. She denies any diarrhea, stating that now she is constipated for the past 4 days.she denies any urinary symptoms.  Past Medical History:  Diagnosis Date  . Asthma   . Family history of adverse reaction to anesthesia       . GERD (gastroesophageal reflux disease)   . H/O chronic pancreatitis   . H/O ETOH abuse   . Headache    "monthly" (06/15/2016)  . History of blood transfusion 2000s   "when they explored my abdomen"  . Hx of pulmonary embolus   . Pancreatic abscess   . Pneumonia    "twice at least" (06/15/2016)   Review of Systems:  Negative except mentioned in history of present illness.  Physical Exam:  Vitals:   12/07/16 1054  BP: 123/83  Pulse: 91  Temp: (!) 97.5 F (36.4 C)  TempSrc: Oral  SpO2: 100%  Weight: 139 lb 1.6 oz (63.1 kg)  Height: 5\' 6"  (1.676 m)    General: Vital signs reviewed.  Patient is well-developed and well-nourished, in no acute distress and cooperative with exam.  Head: Normocephalic and atraumatic. Eyes: EOMI, conjunctivae normal, no scleral icterus.  Cardiovascular: RRR, S1 normal, S2 normal, no murmurs, gallops, or rubs. Pulmonary/Chest: Clear to auscultation bilaterally, no  wheezes, rales, or rhonchi. Abdominal: Soft,tenderness along right and left quadrant and epigastrium, positive Murphy's,abdominal wall hernia just left site to umbilicus, non-distended, BS +, Extremities: No lower extremity edema bilaterally,  pulses symmetric and intact bilaterally. No cyanosis or clubbing. Skin: Warm, dry and intact. No rashes or erythema. Psychiatric: Normal mood and affect. speech and behavior is normal. Cognition and memory are normal.  Assessment & Plan:   See Encounters Tab for problem based charting.  Patient discussed with Dr. Heide SparkNarendra.

## 2016-12-07 NOTE — Patient Instructions (Addendum)
Thank you for visiting clinic today. We will do some lab workup today, we will call you with  abnormal results. Please make an appointment with your gastroenterologist at [facility] as soon as possible. Please continue to take your medications as directed.

## 2016-12-07 NOTE — Assessment & Plan Note (Signed)
Her appetite and diarrhea has been improved after taking Creon 3 times a day.  She was again encouraged to continue taking Creon 3 times daily.

## 2016-12-07 NOTE — Assessment & Plan Note (Addendum)
She was having quite tenderness along the epigastrium, right and left upper quadrant area. No guarding or rebound. She was having positive Murphy's sign, although she had cholecystectomy done. 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 She do had changes for her chronic pancreatitis and some nonspecific opacities in the liver. She does follow up with Dr. Ivan Croftbando at [facility], she is also due for her follow-up with him.  -I advised her to make an appointment with her gastroenterologist as soon as possible. She might need more imaging. -I advised her to continue taking Creon 3 times a day. -We will check CMP, lipase and amylase today.  Addendum.Patient has worsening of liver enzymes, she had an chronically elevated alkaline phosphatase with intermittent elevation of transaminases, her lipase and amylase are within normal limits. I called the patient and left a message to make an appointment with her gastroenterologist as we discussed during office visit.

## 2016-12-08 LAB — CMP14 + ANION GAP
A/G RATIO: 0.8 — AB (ref 1.2–2.2)
ALT: 47 IU/L — AB (ref 0–32)
AST: 142 IU/L — AB (ref 0–40)
Albumin: 2.7 g/dL — ABNORMAL LOW (ref 3.5–5.5)
Alkaline Phosphatase: 282 IU/L — ABNORMAL HIGH (ref 39–117)
Anion Gap: 15 mmol/L (ref 10.0–18.0)
BILIRUBIN TOTAL: 2.2 mg/dL — AB (ref 0.0–1.2)
BUN/Creatinine Ratio: 4 — ABNORMAL LOW (ref 9–23)
BUN: 2 mg/dL — AB (ref 6–24)
CALCIUM: 9 mg/dL (ref 8.7–10.2)
CHLORIDE: 105 mmol/L (ref 96–106)
CO2: 16 mmol/L — AB (ref 20–29)
Creatinine, Ser: 0.55 mg/dL — ABNORMAL LOW (ref 0.57–1.00)
GFR calc non Af Amer: 105 mL/min/{1.73_m2} (ref >59)
GFR, EST AFRICAN AMERICAN: 121 mL/min/{1.73_m2} (ref >59)
GLUCOSE: 92 mg/dL (ref 65–99)
Globulin, Total: 3.5 g/dL (ref 1.5–4.5)
POTASSIUM: 4.9 mmol/L (ref 3.5–5.2)
Sodium: 136 mmol/L (ref 134–144)
Total Protein: 6.2 g/dL (ref 6.0–8.5)

## 2016-12-08 LAB — AMYLASE: Amylase: 26 U/L — ABNORMAL LOW (ref 31–124)

## 2016-12-08 LAB — LIPASE: LIPASE: 18 U/L (ref 14–72)

## 2016-12-12 ENCOUNTER — Ambulatory Visit (INDEPENDENT_AMBULATORY_CARE_PROVIDER_SITE_OTHER): Payer: Managed Care, Other (non HMO) | Admitting: Internal Medicine

## 2016-12-12 ENCOUNTER — Encounter: Payer: Self-pay | Admitting: Internal Medicine

## 2016-12-12 VITALS — BP 122/82 | HR 99 | Temp 98.1°F | Ht 66.0 in | Wt 138.2 lb

## 2016-12-12 DIAGNOSIS — IMO0002 Reserved for concepts with insufficient information to code with codable children: Secondary | ICD-10-CM

## 2016-12-12 DIAGNOSIS — R634 Abnormal weight loss: Secondary | ICD-10-CM | POA: Diagnosis not present

## 2016-12-12 DIAGNOSIS — F17210 Nicotine dependence, cigarettes, uncomplicated: Secondary | ICD-10-CM

## 2016-12-12 DIAGNOSIS — K861 Other chronic pancreatitis: Secondary | ICD-10-CM | POA: Diagnosis not present

## 2016-12-12 DIAGNOSIS — Z720 Tobacco use: Secondary | ICD-10-CM

## 2016-12-12 DIAGNOSIS — F1099 Alcohol use, unspecified with unspecified alcohol-induced disorder: Secondary | ICD-10-CM

## 2016-12-12 NOTE — Assessment & Plan Note (Addendum)
Patient weight stable and she is tolerating food now.   She is scheduled for CT chest for lung cancer screening.  Patient wishes to delay screening mammogram and pap smears for a couple of months; she denies abn vaginal bleeding, breast lumps, skin changes or nipple discharge.

## 2016-12-12 NOTE — Progress Notes (Signed)
Internal Medicine Clinic Attending  Case discussed with Dr. Nelson ChimesAmin at the time of the visit.  We reviewed the resident's history and exam and pertinent patient test results.  I agree with the assessment, diagnosis, and plan of care documented in the resident's note. I suspect that the increased LFTs may be secondary to ETOH use given the disproportionate AST elevation compared to ALT. Will have patient follow up [facility]. Case also discussed with PCP - Dr. Carlynn PurlErik Hoffman

## 2016-12-12 NOTE — Assessment & Plan Note (Signed)
Patient continues to smoke about 2 cigarettes per day, she is not quite ready to quit.

## 2016-12-12 NOTE — Progress Notes (Signed)
   CC: weakness  HPI:  [name] is a 56 y.o. with a PMH of chronic pancreatitis, h/o EtOH abuse, chronic low back pain presenting to clinic for continued weakness.   Patient was recently hospitalized with N/V/D 2/2 acute on chronic pancreatitis and found to be hypokalemic; after repletion patient felt great improvement in her general weakness. Since then she states she has felt about the same in terms of strength, however has noticed she is able to eat and drink a lot better and has not had further N/V/D. She still endorses gen epigastric pain. She endorses compliance with potassium supplementation 40mEq daily; K on 8/30 was 4.9. She is asking for a work note until she is able to [facility] due to not feeling safe to work in [employer] at this time.   Patient continues to smoke about 2 cigarettes per day.   Please see problem based Assessment and Plan for status of patients chronic conditions.  Past Medical History:  Diagnosis Date  . Asthma   . Family history of adverse reaction to anesthesia       . GERD (gastroesophageal reflux disease)   . H/O chronic pancreatitis   . H/O ETOH abuse   . Headache    "monthly" (06/15/2016)  . History of blood transfusion 2000s   "when they explored my abdomen"  . Hx of pulmonary embolus   . Pancreatic abscess   . Pneumonia    "twice at least" (06/15/2016)    Review of Systems:   Review of Systems  Constitutional: Negative for chills and fever.  Respiratory: Negative for shortness of breath.   Cardiovascular: Negative for chest pain and leg swelling.  Gastrointestinal: Negative for abdominal pain, blood in stool, constipation, diarrhea, melena, nausea and vomiting.  Genitourinary: Negative for hematuria.  Musculoskeletal: Negative for myalgias.  Neurological: Positive for weakness (generalized). Negative for focal weakness.    Physical Exam:  Vitals:   12/12/16 1425  BP: 122/82  Pulse: 99  Temp: 98.1 F (36.7  C)  TempSrc: Oral  SpO2: 99%  Weight: 138 lb 3.2 oz (62.7 kg)  Height: 5\' 6"  (1.676 m)   GENERAL- alert, co-operative, appears older than stated age, not in any distress, pale. HEENT- Atraumatic, normocephalic, PERRL, EOMI, oral mucosa appears moist, no conjunctival pallor. No lymphadenopathy. CARDIAC- RRR, no murmurs, rubs or gallops. RESP- Moving equal volumes of air, CTAB, no wheezes or crackles. ABDOMEN- Soft, mild epigastric tenderness, bowel sounds present. NEURO- No obvious Cr N abnormality. EXTREMITIES- warm, pulse 2+, symmetric, no pedal edema, capillary refill 1sec. SKIN- Warm, dry, no rash or lesion. PSYCH- Normal mood and affect, appropriate thought content and speech.  Assessment & Plan:   See Encounters Tab for problem based charting.   Patient discussed with Dr. Lorin PicketVincent   Sharon Cervantez, MD Internal Medicine PGY2

## 2016-12-12 NOTE — Assessment & Plan Note (Signed)
Patient with h/o alcohol use disorder. CMP from last week shows elevated alk phos and AST/ALT elevation consistent with alcohol use. She states that she did drink 1-2 alcoholic beverages for 3 days prior to those labs. She states that she usually will drink 1-2 drinks per month when out with friends.   Plan: --advised her to limit her EtOH intake in setting of hepatic steatosis and evidence of liver dysfunction.

## 2016-12-12 NOTE — Patient Instructions (Signed)
Please continue to take your medications as directed. I have provided you with a work note to let you go back on 12/24/16.   Please make an appointment to see Dr. Mikey BussingHoffman in about 2 months and sooner if needed.

## 2016-12-12 NOTE — Assessment & Plan Note (Addendum)
Patient was recently hospitalized with N/V/D 2/2 acute on chronic pancreatitis and found to be hypokalemic; after repletion patient felt great improvement in her general weakness. Since then she states she has felt about the same in terms of strength, however has noticed she is able to eat and drink a lot better and has not had further N/V/D. She still endorses gen epigastric pain. She endorses compliance with potassium supplementation 40mEq daily; K on 8/30 was 4.9. She is asking for a work note until she is able to see her GI specialist at [facility] due to not feeling safe to work in [employer] at this time.  Patient continues to do well and tolerating food. She denies N/V/D.  Plan: --continue Creon; will f[facility]. --provided patient with delayed return to work note on 9/16 following her GI appt. --will work on her FMLA form as well

## 2016-12-13 NOTE — Progress Notes (Signed)
Internal Medicine Clinic Attending  Case discussed with Dr. Svalina  at the time of the visit.  We reviewed the resident's history and exam and pertinent patient test results.  I agree with the assessment, diagnosis, and plan of care documented in the resident's note.  

## 2016-12-18 ENCOUNTER — Ambulatory Visit (HOSPITAL_COMMUNITY)
Admission: RE | Admit: 2016-12-18 | Discharge: 2016-12-18 | Disposition: A | Discharge status: Home / Self Care | Payer: Managed Care, Other (non HMO) | Source: Ambulatory Visit | Attending: Internal Medicine | Admitting: Internal Medicine

## 2016-12-18 DIAGNOSIS — Z720 Tobacco use: Secondary | ICD-10-CM | POA: Insufficient documentation

## 2016-12-18 DIAGNOSIS — R634 Abnormal weight loss: Secondary | ICD-10-CM | POA: Diagnosis not present

## 2016-12-18 DIAGNOSIS — J439 Emphysema, unspecified: Secondary | ICD-10-CM | POA: Insufficient documentation

## 2016-12-18 DIAGNOSIS — R918 Other nonspecific abnormal finding of lung field: Secondary | ICD-10-CM | POA: Diagnosis not present

## 2016-12-18 DIAGNOSIS — I700 Atherosclerosis of aorta: Secondary | ICD-10-CM | POA: Diagnosis not present

## 2016-12-18 MED ORDER — IOPAMIDOL (ISOVUE-300) INJECTION 61%
INTRAVENOUS | Status: AC
  Administered 2016-12-18: 75 mL
  Filled 2016-12-18: qty 75

## 2016-12-28 ENCOUNTER — Ambulatory Visit (HOSPITAL_COMMUNITY)
Admission: RE | Admit: 2016-12-28 | Discharge: 2016-12-28 | Disposition: A | Discharge status: Home / Self Care | Payer: Managed Care, Other (non HMO) | Source: Ambulatory Visit | Attending: Family Medicine | Admitting: Family Medicine

## 2016-12-28 ENCOUNTER — Ambulatory Visit (INDEPENDENT_AMBULATORY_CARE_PROVIDER_SITE_OTHER): Payer: Managed Care, Other (non HMO) | Admitting: Internal Medicine

## 2016-12-28 VITALS — BP 120/79 | HR 96 | Temp 98.4°F | Wt 136.2 lb

## 2016-12-28 DIAGNOSIS — K7460 Unspecified cirrhosis of liver: Secondary | ICD-10-CM

## 2016-12-28 DIAGNOSIS — I493 Ventricular premature depolarization: Secondary | ICD-10-CM | POA: Diagnosis not present

## 2016-12-28 DIAGNOSIS — D649 Anemia, unspecified: Secondary | ICD-10-CM | POA: Diagnosis not present

## 2016-12-28 DIAGNOSIS — F17210 Nicotine dependence, cigarettes, uncomplicated: Secondary | ICD-10-CM | POA: Diagnosis not present

## 2016-12-28 DIAGNOSIS — R9431 Abnormal electrocardiogram [ECG] [EKG]: Secondary | ICD-10-CM | POA: Insufficient documentation

## 2016-12-28 DIAGNOSIS — R079 Chest pain, unspecified: Secondary | ICD-10-CM | POA: Insufficient documentation

## 2016-12-28 DIAGNOSIS — K861 Other chronic pancreatitis: Secondary | ICD-10-CM | POA: Diagnosis not present

## 2016-12-28 DIAGNOSIS — K429 Umbilical hernia without obstruction or gangrene: Secondary | ICD-10-CM | POA: Diagnosis not present

## 2016-12-28 DIAGNOSIS — K625 Hemorrhage of anus and rectum: Secondary | ICD-10-CM | POA: Diagnosis not present

## 2016-12-28 DIAGNOSIS — K644 Residual hemorrhoidal skin tags: Secondary | ICD-10-CM

## 2016-12-28 DIAGNOSIS — I700 Atherosclerosis of aorta: Secondary | ICD-10-CM | POA: Diagnosis not present

## 2016-12-28 DIAGNOSIS — K860 Alcohol-induced chronic pancreatitis: Secondary | ICD-10-CM

## 2016-12-28 DIAGNOSIS — Z9689 Presence of other specified functional implants: Secondary | ICD-10-CM

## 2016-12-28 DIAGNOSIS — R0789 Other chest pain: Secondary | ICD-10-CM | POA: Insufficient documentation

## 2016-12-28 DIAGNOSIS — K5900 Constipation, unspecified: Secondary | ICD-10-CM | POA: Diagnosis not present

## 2016-12-28 DIAGNOSIS — R710 Precipitous drop in hematocrit: Secondary | ICD-10-CM | POA: Insufficient documentation

## 2016-12-28 LAB — COMPREHENSIVE METABOLIC PANEL
ALK PHOS: 302 U/L — AB (ref 38–126)
ALT: 39 U/L (ref 14–54)
AST: 125 U/L — AB (ref 15–41)
Albumin: 2.4 g/dL — ABNORMAL LOW (ref 3.5–5.0)
Anion gap: 11 (ref 5–15)
CHLORIDE: 99 mmol/L — AB (ref 101–111)
CO2: 19 mmol/L — ABNORMAL LOW (ref 22–32)
CREATININE: 0.7 mg/dL (ref 0.44–1.00)
Calcium: 8.8 mg/dL — ABNORMAL LOW (ref 8.9–10.3)
GFR calc Af Amer: >60 mL/min (ref >60)
Glucose, Bld: 121 mg/dL — ABNORMAL HIGH (ref 65–99)
Potassium: 4.4 mmol/L (ref 3.5–5.1)
Sodium: 129 mmol/L — ABNORMAL LOW (ref 135–145)
Total Bilirubin: 1.5 mg/dL — ABNORMAL HIGH (ref 0.3–1.2)
Total Protein: 6.4 g/dL — ABNORMAL LOW (ref 6.5–8.1)

## 2016-12-28 LAB — CBC
HEMATOCRIT: 32.3 % — AB (ref 36.0–46.0)
HEMOGLOBIN: 11 g/dL — AB (ref 12.0–15.0)
MCH: 34.9 pg — ABNORMAL HIGH (ref 26.0–34.0)
MCHC: 34.1 g/dL (ref 30.0–36.0)
MCV: 102.5 fL — AB (ref 78.0–100.0)
PLATELETS: 359 10*3/uL (ref 150–400)
RBC: 3.15 MIL/uL — AB (ref 3.87–5.11)
RDW: 13.8 % (ref 11.5–15.5)
WBC: 9.5 10*3/uL (ref 4.0–10.5)

## 2016-12-28 LAB — POC HEMOCCULT BLD/STL (OFFICE/1-CARD/DIAGNOSTIC): Fecal Occult Blood, POC: NEGATIVE

## 2016-12-28 MED ORDER — HYDROCORTISONE ACETATE 25 MG RE SUPP: 25.0000 mg | Freq: Two times a day (BID) | RECTAL | 0 refills | Status: DC

## 2016-12-28 MED ORDER — HYDROCORTISONE ACETATE 25 MG RE SUPP: 25.0000 mg | Freq: Two times a day (BID) | RECTAL | 0 refills | Status: AC

## 2016-12-28 NOTE — Assessment & Plan Note (Signed)
The patient is having symptoms concerning for symptomatic anemia including shortness of breath, fatigue, dizziness, and exertional chest pain. Her last CBC was on 12/21/2016 at [facility], which showed a macrocytic anemia with Hgb of 10.1, MCV 108. In the setting of her rectal bleeding, initially concerned about worsening anemia. CBC was obtained in clinic: Lab Results  Component Value Date   WBC 9.5 12/28/2016   HGB 11.0 (L) 12/28/2016   HCT 32.3 (L) 12/28/2016   MCV 102.5 (H) 12/28/2016   PLT 359 12/28/2016  Still consistent with a macrocytic anemia possibly due to alcohol use disorder.  B12 and folate were WNL in 04/2016.   Plan:  -Discussed with patient that if her symptoms of CP, SOB, fatigue and dizziness acutely worsen come to the clinic or the ED to be re-evaluated.

## 2016-12-28 NOTE — Progress Notes (Signed)
   CC: rectal bleeding   HPI:  [name] is a 56 y.o. female with past medial history as documented below presenting with rectal bleeding. She states that she has a history of hemorrhoids and she thinks the bleeding is from her hemorrhoids. She endorses painful bowel movements and anal irritation. She states the blood is bright red and is only on the toilet paper after she wipes, it is not a concerning amount of blood. She denies melena or blood coating her stool. She has constipation and has to strain when she moves her bowels, denies diarrhea. She has been experiencing generalized weakness, fatigue, shortness of breath, and substernal, non-radiating chest pain on exertion. These symptoms have been on going for approximately one month. She does experience bloating, constipation, and abdominal pain, but attributes this to her chronic pancreatitis. She follows with [facility] and is scheduled for an MRI to evaluate for pancreatic duct stent placement at the end of September.  Denies fever or chills.   Past Medical History:  Diagnosis Date  . Asthma   . Family history of adverse reaction to anesthesia       . GERD (gastroesophageal reflux disease)   . H/O chronic pancreatitis   . H/O ETOH abuse   . Headache    "monthly" (06/15/2016)  . History of blood transfusion 2000s   "when they explored my abdomen"  . Hx of pulmonary embolus   . Pancreatic abscess   . Pneumonia    "twice at least" (06/15/2016)   Review of Systems:   Review of Systems  Constitutional: Positive for malaise/fatigue and weight loss. Negative for chills and fever.  Eyes: Negative.   Respiratory: Positive for shortness of breath.   Cardiovascular: Positive for chest pain. Negative for palpitations.  Gastrointestinal: Positive for abdominal pain and constipation. Negative for melena.  Genitourinary: Negative for dysuria.  Musculoskeletal: Negative for falls.  Neurological: Positive for dizziness and weakness. Negative  for focal weakness.    Physical Exam:  Vitals:   12/28/16 0930  BP: 120/79  Pulse: 96  Temp: 98.4 F (36.9 C)  TempSrc: Oral  SpO2: 98%  Weight: 136 lb 3.2 oz (61.8 kg)   Physical Exam  Constitutional: She appears well-developed and well-nourished. No distress.  HENT:  Head: Normocephalic and atraumatic.  Eyes: Conjunctivae and EOM are normal.  Neck: Normal range of motion. Neck supple.  Abdominal: Soft. Bowel sounds are normal. She exhibits no distension. There is tenderness (mild generalized TTP).  + ex lap scar and hernia (left, and superior to umbilicus)  Rectal Exam:  + hemorrhoids and erythema, no frank blood, +TTP on digital rectal exam     Assessment & Plan:   See Encounters Tab for problem based charting.  Patient seen with Dr. Cyndie Chime

## 2016-12-28 NOTE — Assessment & Plan Note (Signed)
Patient's exertional chest pain may be a manifestation of symptomatic anemia, but it is also consistent with stable angina. It is worse with exertion and relieved by rest. EKG was obtained in clinic, which showed normal sinus rhythm with no evidence of acute ischemic changes. Lipid profile from 05/2016 was WNL with an LDL of 74, and mildly decreased HDL at 37. She is not on aspirin or statin therapy. She does have evidence of atherosclerotic disease in her aorta.   Plan: -No intervention at this time -Discussed with patient that if her pain occurs at rest or is not relieved by rest to seek medical attention immediately -If CP persists consider stress test

## 2016-12-28 NOTE — Assessment & Plan Note (Addendum)
The patient's rectal bleeding is most likely due to external hemorrhoids.  Hemorrhoids are present on physical exam. She also had tenderness during rectal exam. Even more reassuring her hemoccult was negative.  Plan: -Anusol 25 mg suppository twice daily for 7 days -CBC

## 2016-12-28 NOTE — Patient Instructions (Addendum)
Ms. Gagner,   I am sorry you are not feeling well.   Your rectal bleeding is most likely from your hemorrhoids. I have prescribed you Anusol suppository. Use 1 suppository every 12 hours for 5-7 days. This should help stop the rectal bleeding as well as the pain.  The EKG we performed was normal. If you experience chest pain while sitting down or at rest, please come to clinic or go to the emergency department to be evaluated.  You blood count is low, but not lower than it was last week. If you experience worsening fatigue, shortness of breath, or chest pain please return to clinic.

## 2016-12-29 NOTE — Progress Notes (Signed)
Medicine attending: I personally interviewed and briefly examined this patient on the day of the patient visit and reviewed pertinent clinical ,laboratory, and radiographic data  with resident physician Dr. Landis Martins and we discussed a management plan. Low grade rectal bleeding likely from hemorrhoids in setting of advanced cirrhosis. Hb stable. Close follow up with [facility]. Replacement of pancreatic stent planned.

## 2017-01-10 ENCOUNTER — Ambulatory Visit (INDEPENDENT_AMBULATORY_CARE_PROVIDER_SITE_OTHER): Payer: Managed Care, Other (non HMO) | Admitting: Internal Medicine

## 2017-01-10 VITALS — BP 120/67 | HR 105 | Temp 97.7°F | Wt 132.8 lb

## 2017-01-10 DIAGNOSIS — G894 Chronic pain syndrome: Secondary | ICD-10-CM | POA: Diagnosis not present

## 2017-01-10 DIAGNOSIS — K649 Unspecified hemorrhoids: Secondary | ICD-10-CM | POA: Diagnosis not present

## 2017-01-10 MED ORDER — PHENYLEPHRINE IN HARD FAT 0.25 % RE SUPP
1.0000 | Freq: Two times a day (BID) | RECTAL | 0 refills | Status: DC
Start: 2017-01-10 — End: 2017-04-12

## 2017-01-10 MED ORDER — WITCH HAZEL-GLYCERIN EX PADS: 1.0000 "application " | MEDICATED_PAD | CUTANEOUS | 0 refills | Status: DC | PRN

## 2017-01-10 MED ORDER — TRAMADOL HCL 50 MG PO TABS: 50.0000 mg | ORAL_TABLET | Freq: Every day | ORAL | 0 refills | Status: DC

## 2017-01-10 NOTE — Progress Notes (Signed)
Internal Medicine Clinic Attending  Case discussed with Dr. Saraiya at the time of the visit.  We reviewed the resident's history and exam and pertinent patient test results.  I agree with the assessment, diagnosis, and plan of care documented in the resident's note.  

## 2017-01-10 NOTE — Patient Instructions (Signed)
Thank you for your visit today Please use the preparation-H cream, I have prescribed it or you can buy over the counter, as well as tucks Have given you a 30 day supply of tramadol- please follow up next month for your appointment Please follow up with [facility]

## 2017-01-10 NOTE — Progress Notes (Signed)
    CC: hemorrhoids, chronic pain HPI: [name] is a 56 y.o. woman with PMH noted below here for hemorrhoids, chronic pain   Please see Problem List/A&P for the status of the patient's chronic medical problems   Past Medical History:  Diagnosis Date  . Asthma   . Family history of adverse reaction to anesthesia       . GERD (gastroesophageal reflux disease)   . H/O chronic pancreatitis   . H/O ETOH abuse   . Headache    "monthly" (06/15/2016)  . History of blood transfusion 2000s   "when they explored my abdomen"  . Hx of pulmonary embolus   . Pancreatic abscess   . Pneumonia    "twice at least" (06/15/2016)    Review of Systems: Denies fevers, chills, fatigue Denies n/v/abd pain. Has pain from hemorrhoids- and some bleeding- unchanged from last visit Denies alcohol use  Physical Exam: Vitals:   01/10/17 0836  BP: 120/67  Pulse: (!) 105  Temp: 97.7 F (36.5 C)  TempSrc: Oral  SpO2: 100%  Weight: 132 lb 12.8 oz (60.2 kg)    General: A&O, in NAD Neck: supple, midline trachea CV: RRR, normal s1, s2, no m/r/g Resp: equal and symmetric breath sounds, no wheezing or crackles  Abdomen: soft, nontender, nondistended, +BS   Assessment & Plan:   See encounters tab for problem based medical decision making. Patient discussed with Dr. Cleda Daub

## 2017-01-10 NOTE — Assessment & Plan Note (Signed)
Patient was started on tramadol on may 31st. She says she takes one tramadol daily for her chronic pancreatitis, and then half extra pill if pain does not respond. She follows up with [facility] for her chronic pancreatitis, and plan is to put a stent again. Repeat MRI showed no peripancreatic fluid collection. Unable to review database due to new interface they have. I discussed with Dr Mikey Bussing and it is appropriate to give tramadol for her pain.  Plan -UDS today (prior one showed alcohol presence) -given 45 tablets of tramadol -follow up next month

## 2017-01-10 NOTE — Assessment & Plan Note (Signed)
Patient presents back to the clinic with continuing concerns for her hemorrhoids. She was seen on 9/20 and says there is no change in her symptoms- better or worse. She was given Anusol suppository but does not seem to help. CBC done that time was stable.   Plan -given Preparation-H which has phenylephrine  -encouraged high fiber diet -explained if continuing concerns, then surgery can be option- but has high rates of recurrence so unfortunately, no good options here.

## 2017-01-19 ENCOUNTER — Ambulatory Visit (INDEPENDENT_AMBULATORY_CARE_PROVIDER_SITE_OTHER): Payer: Managed Care, Other (non HMO) | Admitting: Internal Medicine

## 2017-01-19 ENCOUNTER — Encounter: Payer: Self-pay | Admitting: Internal Medicine

## 2017-01-19 ENCOUNTER — Telehealth: Payer: Self-pay

## 2017-01-19 VITALS — BP 106/72 | HR 102 | Temp 97.8°F | Ht 66.0 in | Wt 127.0 lb

## 2017-01-19 DIAGNOSIS — E876 Hypokalemia: Secondary | ICD-10-CM | POA: Diagnosis not present

## 2017-01-19 DIAGNOSIS — K861 Other chronic pancreatitis: Secondary | ICD-10-CM | POA: Diagnosis not present

## 2017-01-19 LAB — COMPREHENSIVE METABOLIC PANEL
ALBUMIN: 2.6 g/dL — AB (ref 3.5–5.0)
ALT: 29 U/L (ref 14–54)
ANION GAP: 11 (ref 5–15)
AST: 115 U/L — ABNORMAL HIGH (ref 15–41)
Alkaline Phosphatase: 303 U/L — ABNORMAL HIGH (ref 38–126)
BUN: <5 mg/dL — ABNORMAL LOW (ref 6–20)
CHLORIDE: 95 mmol/L — AB (ref 101–111)
CO2: 22 mmol/L (ref 22–32)
Calcium: 8.8 mg/dL — ABNORMAL LOW (ref 8.9–10.3)
Creatinine, Ser: 0.67 mg/dL (ref 0.44–1.00)
GFR calc non Af Amer: >60 mL/min (ref >60)
GLUCOSE: 147 mg/dL — AB (ref 65–99)
POTASSIUM: 3 mmol/L — AB (ref 3.5–5.1)
SODIUM: 128 mmol/L — AB (ref 135–145)
Total Bilirubin: 2.5 mg/dL — ABNORMAL HIGH (ref 0.3–1.2)
Total Protein: 6.7 g/dL (ref 6.5–8.1)

## 2017-01-19 LAB — CBC
HEMATOCRIT: 31.1 % — AB (ref 36.0–46.0)
HEMOGLOBIN: 10.7 g/dL — AB (ref 12.0–15.0)
MCH: 34 pg (ref 26.0–34.0)
MCHC: 34.4 g/dL (ref 30.0–36.0)
MCV: 98.7 fL (ref 78.0–100.0)
PLATELETS: 242 10*3/uL (ref 150–400)
RBC: 3.15 MIL/uL — AB (ref 3.87–5.11)
RDW: 14.3 % (ref 11.5–15.5)
WBC: 7.1 10*3/uL (ref 4.0–10.5)

## 2017-01-19 LAB — LIPASE, BLOOD: Lipase: 22 U/L (ref 11–51)

## 2017-01-19 MED ORDER — POTASSIUM CHLORIDE CRYS ER 20 MEQ PO TBCR
60.0000 meq | EXTENDED_RELEASE_TABLET | Freq: Once | ORAL | Status: AC
  Administered 2017-01-19: 60 meq via ORAL

## 2017-01-19 NOTE — Progress Notes (Signed)
Internal Medicine Clinic Attending  Case discussed with Dr. Saraiya at the time of the visit.  We reviewed the resident's history and exam and pertinent patient test results.  I agree with the assessment, diagnosis, and plan of care documented in the resident's note.  

## 2017-01-19 NOTE — Assessment & Plan Note (Addendum)
Patient presents with continuing concerns for weight loss. Says she has lost about 50-60 pounds since January. She has history of chronic pancreatitis started since around 2011, attributed to alcohol history. Says she has not drunk alcohol in few months now.  She follows with [facility] Dr Kirby Funk.  She had stents placed in March, KUB in August shows absence of the stents, CT chest and abd showed similar. and then she had an MRI on Sept 29 which showed improvement of the chronic pancreatitis, and resolution of the peripancreatic fluid collection. She was told by him that there was no surgical indication at this time and patient wanted to follow with a local GI doctor.  She says that she has no appetite and takes very few calories during the day. This morning, she had no breakfast. Yesterday, all she had was chicken noodle soup. She denies any constipation or diarrhea, but says diarrhea can happen after she eats sometimes. She is on creon supplementation.  She has tried nutrition supplements but does not like ensure or boost. She is wondering if a feeding tube is going to help her. She says she is compliant with her creon.  On exam today, she looked pale and eyes were jaundiced raising concern for cirrhosis, and had epigastric pain, so she may have acute on chronic pancreatitis.  We ordered stat CBC, CMET and lipase. Shows normal lipase, HgB at baseline around 10.7. Platelets are 242 which is reassuring that there is no acute liver dysfunction or cirrhosis. T Bili is 2.5 which is up from 1.5 last time, but review of prior values suggest is has been chronically high around this value.   Explained her that she does not have acute on chronic pancreatitis currently and her lab values are similar to prior ones, except hypokalemia which was repleted in the clinic today.   Plan -placed referral to local GI. Asked her to continue follow up with current Gi at [facility]. -she will follow up in 4 weeks with her pcp dr  Mikey Bussing  Addendum: I had evaluated the patient, and gave her out of work note for 2 weeks. 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 Currently she is taking tramadol for her chronic abd pain. She has also lost significant amount of weight in the last few months and endorsed minimal appetite, which is why I thought it was appropriate for her to be out of work.   I do not think she may be taking her creon even though she says she is, and so getting her established with a local GI doctor will be important. She has an appointment with her [facility] doctor in 1st week of November which she says she will try to move up. I believe that at the next appointment in our clinic if the patient continues to request further time off, she will need to be evaluated with physical therapy referral to determine what she can or can not do, and possibley evaluation for long term disability, as currently she is on short term disability. I am unsure who evaluates for long term disability, but I think there are providers in [place] who can do that.  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 She will need counseling  on this and will need to go over these results with her at next visit.

## 2017-01-19 NOTE — Assessment & Plan Note (Signed)
K of 3 today on stat cmet. Given 60 meQ of Kdur  Plan Asked pt to continue her current outpatient dose of Kdur which is 40 meq daily

## 2017-01-19 NOTE — Patient Instructions (Addendum)
Thank you for your visit today We obtained labs today, and you do not have acute worsening of the pancreatitis, and your labs for your liver are slightly abnormal but they are similar to your prior lab values. You will need close follow up with the GI doctor- I have put in referral for local doctor, but it may take a while, so continue to follow up with your doctor in [facility] Please continue to take your potassium supplements that have been prescribed to you earlier Please try to eat more calorie dense diet- add supplements  Please do not drink alcohol

## 2017-01-19 NOTE — Telephone Encounter (Signed)
Faxed FMLA form @ [phone] on 01/19/2017.

## 2017-01-19 NOTE — Progress Notes (Signed)
    CC: chronic pancreatitis  HPI: [name] is a 56 y.o. woman with PMH noted below here for continuing concerns for her weight loss and chronic pancreatitis   Please see Problem List/A&P for the status of the patient's chronic medical problems   Past Medical History:  Diagnosis Date  . Asthma   . Family history of adverse reaction to anesthesia       . GERD (gastroesophageal reflux disease)   . H/O chronic pancreatitis   . H/O ETOH abuse   . Headache    "monthly" (06/15/2016)  . History of blood transfusion 2000s   "when they explored my abdomen"  . Hx of pulmonary embolus   . Pancreatic abscess   . Pneumonia    "twice at least" (06/15/2016)    Review of Systems: Denies fevers, chills. Has weight loss of about 50-60 pounds since January. Is fatigued all the time Denies cough, SOB Has chronic nausea, sometimes has vomiting. Has no appetite. Has some diarrhea when she eats but has not been eating. Denies hematochezia or melena. Says hemorrhoids are better. Denies rash Has dizziness sometimes   Physical Exam: Vitals:   01/19/17 1056 01/19/17 1144  BP: 119/67 109/68  Pulse: 93 89  Temp: 97.8 F (36.6 C)   TempSrc: Oral   SpO2: 100%   Weight: 127 lb (57.6 kg)   Height:  (1.676 m)    Orthostatics: 109/68  84 109/73   92 106/70   102  General: A&O, looks pale and jaundiced  HEENT: EOMI, yellowing of the sclera Neck: supple, midline trachea,  CV: RRR, normal s1, s2, no m/r/g Resp: equal and symmetric breath sounds, no wheezing or crackles  Abdomen: soft, nondistended, +BS,  Tenderness to palpation in epigastric area  Skin: warm, dry, intact  Assessment & Plan:   See encounters tab for problem based medical decision making. Patient discussed with Dr. Oswaldo Done

## 2017-01-23 LAB — TOXASSURE SELECT,+ANTIDEPR,UR

## 2017-01-25 ENCOUNTER — Inpatient Hospital Stay (HOSPITAL_COMMUNITY)
Admission: EM | Admit: 2017-01-25 | Discharge: 2017-02-03 | DRG: 438 | Disposition: A | Discharge status: Skilled Nursing Facility | Payer: Managed Care, Other (non HMO) | Attending: Student in an Organized Health Care Education/Training Program | Admitting: Student in an Organized Health Care Education/Training Program

## 2017-01-25 ENCOUNTER — Encounter (HOSPITAL_COMMUNITY): Payer: Self-pay | Admitting: Emergency Medicine

## 2017-01-25 ENCOUNTER — Emergency Department (HOSPITAL_COMMUNITY): Payer: Managed Care, Other (non HMO)

## 2017-01-25 DIAGNOSIS — R918 Other nonspecific abnormal finding of lung field: Secondary | ICD-10-CM | POA: Diagnosis not present

## 2017-01-25 DIAGNOSIS — K439 Ventral hernia without obstruction or gangrene: Secondary | ICD-10-CM | POA: Diagnosis not present

## 2017-01-25 DIAGNOSIS — Z86711 Personal history of pulmonary embolism: Secondary | ICD-10-CM | POA: Diagnosis not present

## 2017-01-25 DIAGNOSIS — E876 Hypokalemia: Secondary | ICD-10-CM | POA: Diagnosis present

## 2017-01-25 DIAGNOSIS — D649 Anemia, unspecified: Secondary | ICD-10-CM | POA: Diagnosis not present

## 2017-01-25 DIAGNOSIS — Z7289 Other problems related to lifestyle: Secondary | ICD-10-CM | POA: Diagnosis not present

## 2017-01-25 DIAGNOSIS — R17 Unspecified jaundice: Secondary | ICD-10-CM | POA: Diagnosis present

## 2017-01-25 DIAGNOSIS — Z9049 Acquired absence of other specified parts of digestive tract: Secondary | ICD-10-CM

## 2017-01-25 DIAGNOSIS — F17210 Nicotine dependence, cigarettes, uncomplicated: Secondary | ICD-10-CM | POA: Diagnosis present

## 2017-01-25 DIAGNOSIS — K860 Alcohol-induced chronic pancreatitis: Secondary | ICD-10-CM | POA: Diagnosis present

## 2017-01-25 DIAGNOSIS — Y92239 Unspecified place in hospital as the place of occurrence of the external cause: Secondary | ICD-10-CM | POA: Diagnosis not present

## 2017-01-25 DIAGNOSIS — D62 Acute posthemorrhagic anemia: Secondary | ICD-10-CM | POA: Diagnosis present

## 2017-01-25 DIAGNOSIS — K625 Hemorrhage of anus and rectum: Secondary | ICD-10-CM | POA: Diagnosis not present

## 2017-01-25 DIAGNOSIS — K6289 Other specified diseases of anus and rectum: Secondary | ICD-10-CM | POA: Diagnosis not present

## 2017-01-25 DIAGNOSIS — K649 Unspecified hemorrhoids: Secondary | ICD-10-CM | POA: Diagnosis not present

## 2017-01-25 DIAGNOSIS — K5909 Other constipation: Secondary | ICD-10-CM | POA: Diagnosis not present

## 2017-01-25 DIAGNOSIS — K861 Other chronic pancreatitis: Secondary | ICD-10-CM | POA: Diagnosis present

## 2017-01-25 DIAGNOSIS — K648 Other hemorrhoids: Secondary | ICD-10-CM | POA: Diagnosis present

## 2017-01-25 DIAGNOSIS — M79671 Pain in right foot: Secondary | ICD-10-CM | POA: Diagnosis not present

## 2017-01-25 DIAGNOSIS — E860 Dehydration: Secondary | ICD-10-CM | POA: Diagnosis present

## 2017-01-25 DIAGNOSIS — E872 Acidosis: Secondary | ICD-10-CM | POA: Diagnosis present

## 2017-01-25 DIAGNOSIS — W010XXA Fall on same level from slipping, tripping and stumbling without subsequent striking against object, initial encounter: Secondary | ICD-10-CM | POA: Diagnosis not present

## 2017-01-25 DIAGNOSIS — E871 Hypo-osmolality and hyponatremia: Secondary | ICD-10-CM | POA: Diagnosis present

## 2017-01-25 DIAGNOSIS — K852 Alcohol induced acute pancreatitis without necrosis or infection: Principal | ICD-10-CM | POA: Diagnosis present

## 2017-01-25 DIAGNOSIS — R0902 Hypoxemia: Secondary | ICD-10-CM | POA: Diagnosis present

## 2017-01-25 DIAGNOSIS — Q453 Other congenital malformations of pancreas and pancreatic duct: Secondary | ICD-10-CM

## 2017-01-25 DIAGNOSIS — J189 Pneumonia, unspecified organism: Secondary | ICD-10-CM | POA: Diagnosis present

## 2017-01-25 DIAGNOSIS — S93401A Sprain of unspecified ligament of right ankle, initial encounter: Secondary | ICD-10-CM | POA: Diagnosis not present

## 2017-01-25 DIAGNOSIS — R1013 Epigastric pain: Secondary | ICD-10-CM

## 2017-01-25 DIAGNOSIS — Z820 Family history of epilepsy and other diseases of the nervous system: Secondary | ICD-10-CM | POA: Diagnosis not present

## 2017-01-25 DIAGNOSIS — K859 Acute pancreatitis without necrosis or infection, unspecified: Secondary | ICD-10-CM | POA: Diagnosis not present

## 2017-01-25 DIAGNOSIS — E8770 Fluid overload, unspecified: Secondary | ICD-10-CM | POA: Diagnosis present

## 2017-01-25 DIAGNOSIS — K601 Chronic anal fissure: Secondary | ICD-10-CM | POA: Diagnosis present

## 2017-01-25 DIAGNOSIS — M79672 Pain in left foot: Secondary | ICD-10-CM | POA: Diagnosis not present

## 2017-01-25 DIAGNOSIS — W19XXXA Unspecified fall, initial encounter: Secondary | ICD-10-CM

## 2017-01-25 DIAGNOSIS — R710 Precipitous drop in hematocrit: Secondary | ICD-10-CM | POA: Diagnosis not present

## 2017-01-25 DIAGNOSIS — K8681 Exocrine pancreatic insufficiency: Secondary | ICD-10-CM | POA: Diagnosis present

## 2017-01-25 DIAGNOSIS — G8929 Other chronic pain: Secondary | ICD-10-CM | POA: Diagnosis present

## 2017-01-25 DIAGNOSIS — Z8719 Personal history of other diseases of the digestive system: Secondary | ICD-10-CM

## 2017-01-25 DIAGNOSIS — F1010 Alcohol abuse, uncomplicated: Secondary | ICD-10-CM | POA: Diagnosis present

## 2017-01-25 DIAGNOSIS — S93492A Sprain of other ligament of left ankle, initial encounter: Secondary | ICD-10-CM | POA: Diagnosis not present

## 2017-01-25 DIAGNOSIS — K219 Gastro-esophageal reflux disease without esophagitis: Secondary | ICD-10-CM | POA: Diagnosis present

## 2017-01-25 DIAGNOSIS — K8689 Other specified diseases of pancreas: Secondary | ICD-10-CM | POA: Diagnosis present

## 2017-01-25 DIAGNOSIS — K644 Residual hemorrhoidal skin tags: Secondary | ICD-10-CM | POA: Diagnosis present

## 2017-01-25 DIAGNOSIS — J45909 Unspecified asthma, uncomplicated: Secondary | ICD-10-CM

## 2017-01-25 DIAGNOSIS — K645 Perianal venous thrombosis: Secondary | ICD-10-CM | POA: Diagnosis present

## 2017-01-25 DIAGNOSIS — J810 Acute pulmonary edema: Secondary | ICD-10-CM | POA: Diagnosis not present

## 2017-01-25 DIAGNOSIS — Z885 Allergy status to narcotic agent status: Secondary | ICD-10-CM

## 2017-01-25 DIAGNOSIS — S93491A Sprain of other ligament of right ankle, initial encounter: Secondary | ICD-10-CM | POA: Diagnosis not present

## 2017-01-25 DIAGNOSIS — K602 Anal fissure, unspecified: Secondary | ICD-10-CM | POA: Diagnosis not present

## 2017-01-25 DIAGNOSIS — S93402A Sprain of unspecified ligament of left ankle, initial encounter: Secondary | ICD-10-CM | POA: Diagnosis not present

## 2017-01-25 DIAGNOSIS — D500 Iron deficiency anemia secondary to blood loss (chronic): Secondary | ICD-10-CM | POA: Diagnosis not present

## 2017-01-25 DIAGNOSIS — R0602 Shortness of breath: Secondary | ICD-10-CM | POA: Diagnosis not present

## 2017-01-25 DIAGNOSIS — R188 Other ascites: Secondary | ICD-10-CM | POA: Diagnosis not present

## 2017-01-25 DIAGNOSIS — F329 Major depressive disorder, single episode, unspecified: Secondary | ICD-10-CM | POA: Diagnosis present

## 2017-01-25 HISTORY — DX: Acute pancreatitis with uninfected necrosis, unspecified: K85.91

## 2017-01-25 HISTORY — DX: Other congenital malformations of pancreas and pancreatic duct: Q45.3

## 2017-01-25 LAB — COMPREHENSIVE METABOLIC PANEL
ALK PHOS: 319 U/L — AB (ref 38–126)
ALT: 31 U/L (ref 14–54)
AST: 114 U/L — ABNORMAL HIGH (ref 15–41)
Albumin: 2.8 g/dL — ABNORMAL LOW (ref 3.5–5.0)
Anion gap: 15 (ref 5–15)
BILIRUBIN TOTAL: 2 mg/dL — AB (ref 0.3–1.2)
CALCIUM: 8.6 mg/dL — AB (ref 8.9–10.3)
CO2: 17 mmol/L — ABNORMAL LOW (ref 22–32)
CREATININE: 0.72 mg/dL (ref 0.44–1.00)
Chloride: 91 mmol/L — ABNORMAL LOW (ref 101–111)
GFR calc Af Amer: >60 mL/min (ref >60)
Glucose, Bld: 128 mg/dL — ABNORMAL HIGH (ref 65–99)
Potassium: 3.3 mmol/L — ABNORMAL LOW (ref 3.5–5.1)
Sodium: 123 mmol/L — ABNORMAL LOW (ref 135–145)
Total Protein: 7.2 g/dL (ref 6.5–8.1)

## 2017-01-25 LAB — URINALYSIS, ROUTINE W REFLEX MICROSCOPIC
Bilirubin Urine: NEGATIVE
GLUCOSE, UA: NEGATIVE mg/dL
Ketones, ur: NEGATIVE mg/dL
Nitrite: NEGATIVE
PH: 7 (ref 5.0–8.0)
PROTEIN: NEGATIVE mg/dL
Specific Gravity, Urine: 1.011 (ref 1.005–1.030)

## 2017-01-25 LAB — CBC
HCT: 31.2 % — ABNORMAL LOW (ref 36.0–46.0)
Hemoglobin: 11.1 g/dL — ABNORMAL LOW (ref 12.0–15.0)
MCH: 34.5 pg — AB (ref 26.0–34.0)
MCHC: 35.6 g/dL (ref 30.0–36.0)
MCV: 96.9 fL (ref 78.0–100.0)
PLATELETS: 341 10*3/uL (ref 150–400)
RBC: 3.22 MIL/uL — AB (ref 3.87–5.11)
RDW: 14.5 % (ref 11.5–15.5)
WBC: 9.1 10*3/uL (ref 4.0–10.5)

## 2017-01-25 LAB — I-STAT CHEM 8, ED
CALCIUM ION: 0.95 mmol/L — AB (ref 1.15–1.40)
CHLORIDE: 96 mmol/L — AB (ref 101–111)
Creatinine, Ser: 0.4 mg/dL — ABNORMAL LOW (ref 0.44–1.00)
GLUCOSE: 92 mg/dL (ref 65–99)
HCT: 27 % — ABNORMAL LOW (ref 36.0–46.0)
Hemoglobin: 9.2 g/dL — ABNORMAL LOW (ref 12.0–15.0)
Potassium: 2.7 mmol/L — CL (ref 3.5–5.1)
Sodium: 129 mmol/L — ABNORMAL LOW (ref 135–145)
TCO2: 16 mmol/L — ABNORMAL LOW (ref 22–32)

## 2017-01-25 LAB — LIPASE, BLOOD: Lipase: 47 U/L (ref 11–51)

## 2017-01-25 LAB — I-STAT TROPONIN, ED: TROPONIN I, POC: 0.03 ng/mL (ref 0.00–0.08)

## 2017-01-25 LAB — I-STAT CG4 LACTIC ACID, ED
Lactic Acid, Venous: 6.82 mmol/L (ref 0.5–1.9)
Lactic Acid, Venous: 6.02 mmol/L (ref 0.5–1.9)
Lactic Acid, Venous: 3.33 mmol/L (ref 0.5–1.9)

## 2017-01-25 LAB — ETHANOL: Alcohol, Ethyl (B): <10 mg/dL (ref <10)

## 2017-01-25 MED ORDER — ONDANSETRON 4 MG PO TBDP
4.0000 mg | ORAL_TABLET | Freq: Once | ORAL | Status: AC | PRN
  Administered 2017-01-25: 4 mg via ORAL

## 2017-01-25 MED ORDER — HYDROMORPHONE HCL 1 MG/ML IJ SOLN
1.0000 mg | Freq: Once | INTRAMUSCULAR | Status: AC
  Administered 2017-01-25: 1 mg via INTRAVENOUS
  Filled 2017-01-25: qty 1

## 2017-01-25 MED ORDER — PANTOPRAZOLE SODIUM 40 MG PO TBEC
40.0000 mg | DELAYED_RELEASE_TABLET | Freq: Every day | ORAL | Status: DC
  Administered 2017-01-26 – 2017-02-03 (×7): 40 mg via ORAL
  Filled 2017-01-25 (×8): qty 1

## 2017-01-25 MED ORDER — HYDROMORPHONE HCL 1 MG/ML IJ SOLN
1.0000 mg | INTRAMUSCULAR | Status: DC | PRN
  Administered 2017-01-26 – 2017-01-28 (×9): 1 mg via INTRAVENOUS
  Filled 2017-01-25 (×9): qty 1

## 2017-01-25 MED ORDER — ONDANSETRON HCL 4 MG/2ML IJ SOLN
4.0000 mg | Freq: Once | INTRAMUSCULAR | Status: AC | PRN
  Administered 2017-01-26: 4 mg via INTRAVENOUS
  Filled 2017-01-25: qty 2

## 2017-01-25 MED ORDER — IOPAMIDOL (ISOVUE-300) INJECTION 61%
INTRAVENOUS | Status: AC
  Administered 2017-01-25: 100 mL
  Filled 2017-01-25: qty 100

## 2017-01-25 MED ORDER — SODIUM CHLORIDE 0.9 % IV BOLUS (SEPSIS): 1000.0000 mL | Freq: Once | INTRAVENOUS | Status: DC

## 2017-01-25 MED ORDER — SODIUM CHLORIDE 0.9 % IV BOLUS (SEPSIS)
1000.0000 mL | Freq: Once | INTRAVENOUS | Status: AC
  Administered 2017-01-25: 1000 mL via INTRAVENOUS

## 2017-01-25 MED ORDER — PANCRELIPASE (LIP-PROT-AMYL) 36000-114000 UNITS PO CPEP
72000.0000 [IU] | ORAL_CAPSULE | Freq: Three times a day (TID) | ORAL | Status: DC
  Filled 2017-01-25 (×3): qty 2

## 2017-01-25 MED ORDER — FOLIC ACID 1 MG PO TABS
1.0000 mg | ORAL_TABLET | Freq: Every day | ORAL | Status: DC
  Administered 2017-01-27 – 2017-02-03 (×6): 1 mg via ORAL
  Filled 2017-01-25 (×10): qty 1

## 2017-01-25 MED ORDER — ONDANSETRON HCL 4 MG/2ML IJ SOLN
4.0000 mg | Freq: Once | INTRAMUSCULAR | Status: AC
  Administered 2017-01-25: 4 mg via INTRAVENOUS
  Filled 2017-01-25: qty 2

## 2017-01-25 MED ORDER — THIAMINE HCL 100 MG/ML IJ SOLN
100.0000 mg | Freq: Every day | INTRAMUSCULAR | Status: DC
  Filled 2017-01-25: qty 2

## 2017-01-25 MED ORDER — PROMETHAZINE HCL 25 MG/ML IJ SOLN: 12.5000 mg | Freq: Four times a day (QID) | INTRAMUSCULAR | Status: DC | PRN

## 2017-01-25 MED ORDER — VITAMIN B-1 100 MG PO TABS
100.0000 mg | ORAL_TABLET | Freq: Every day | ORAL | Status: DC
  Administered 2017-01-26 – 2017-02-03 (×8): 100 mg via ORAL
  Filled 2017-01-25 (×8): qty 1

## 2017-01-25 MED ORDER — FENTANYL CITRATE (PF) 100 MCG/2ML IJ SOLN
50.0000 ug | Freq: Once | INTRAMUSCULAR | Status: AC
  Administered 2017-01-25: 50 ug via INTRAVENOUS
  Filled 2017-01-25: qty 2

## 2017-01-25 MED ORDER — ONDANSETRON 4 MG PO TBDP
ORAL_TABLET | ORAL | Status: AC
  Filled 2017-01-25: qty 1

## 2017-01-25 MED ORDER — ENOXAPARIN SODIUM 40 MG/0.4ML ~~LOC~~ SOLN
40.0000 mg | SUBCUTANEOUS | Status: DC
  Administered 2017-01-30: 40 mg via SUBCUTANEOUS
  Filled 2017-01-25 (×7): qty 0.4

## 2017-01-25 MED ORDER — POTASSIUM CHLORIDE 10 MEQ/100ML IV SOLN
10.0000 meq | INTRAVENOUS | Status: AC
  Administered 2017-01-25 – 2017-01-26 (×3): 10 meq via INTRAVENOUS
  Filled 2017-01-25 (×3): qty 100

## 2017-01-25 MED ORDER — SODIUM CHLORIDE 0.9 % IV SOLN
INTRAVENOUS | Status: DC
  Administered 2017-01-26: via INTRAVENOUS

## 2017-01-25 MED ORDER — POTASSIUM CHLORIDE CRYS ER 20 MEQ PO TBCR
40.0000 meq | EXTENDED_RELEASE_TABLET | Freq: Once | ORAL | Status: DC
  Filled 2017-01-25: qty 2

## 2017-01-25 MED ORDER — ADULT MULTIVITAMIN W/MINERALS CH
1.0000 | ORAL_TABLET | Freq: Every day | ORAL | Status: DC
  Administered 2017-01-26 – 2017-02-03 (×7): 1 via ORAL
  Filled 2017-01-25 (×8): qty 1

## 2017-01-25 NOTE — ED Notes (Signed)
Asher MuirJamie PA advised about lactic.

## 2017-01-25 NOTE — ED Notes (Signed)
Writer asked pt if she was able to urinate at this time, she stated she cannot.

## 2017-01-25 NOTE — ED Provider Notes (Signed)
[facility] EMERGENCY DEPARTMENT Provider Note   CSN: [id number] Arrival date & time: 01/25/17  1441     History   Chief Complaint Chief Complaint  Patient presents with  . Emesis  . Jaundice    HPI Dorothy Polhemus is a 56 y.o. female.  The history is provided by the patient and medical records. No language interpreter was used.   Mallori Araque is a 56 y.o. female  with a PMH of chronic pancreatitis, GERD, anemia, chronic pain who presents to the Emergency Department complaining of progressively worsening upper abdominal pain associated with nausea and vomiting x 2 months. Patient states that she has not been able to keep any food down in the last month. She reports multiple episodes of emesis daily. She reports weight loss of 50 pounds since January. She states that she has seen her PCP almost weekly since onset but no one has told her what is making her feel so poorly. She denies fever, chills. No urinary symptoms. She does report that she will have chest pain with exertion, but this has been present for several years with no change. No new chest pain. No shortness of breath. Denies alleviating or aggravating factors.   Past Medical History:  Diagnosis Date  . Asthma   . Family history of adverse reaction to anesthesia       . GERD (gastroesophageal reflux disease)   . H/O chronic pancreatitis   . H/O ETOH abuse   . Headache    "monthly" (06/15/2016)  . History of blood transfusion 2000s   "when they explored my abdomen"  . Hx of pulmonary embolus   . Pancreatic abscess   . Pneumonia    "twice at least" (06/15/2016)    Patient Active Problem List   Diagnosis Date Noted  . Rectal bleeding 12/28/2016  . Anemia 12/28/2016  . Chest pain on exertion 12/28/2016  . Pain of upper abdomen 12/07/2016  . Pancreatic steatorrhea   . Severe protein-calorie malnutrition (HCC)   . Unintentional weight loss of more than 10 pounds 11/13/2016  . Purpura,  nonthrombopenic (HCC) 09/07/2016  . Chronic pain syndrome 09/07/2016  . Hemorrhoids 08/23/2016  . Hypertension 07/23/2016  . 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 Thoracic aortic atherosclerosis (HCC) 06/20/2016  . Dilation of pancreatic duct 06/16/2016  . Hepatic lesion 06/05/2016  . History of hypertension 06/05/2016  . Possible Cirrhosis 06/05/2016  . Hypokalemia   . Chronic pancreatitis (HCC) 04/21/2016  . Asthma 04/21/2016  . 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 S/P cholecystectomy 04/21/2016  . Tobacco abuse 04/21/2016  . Alcohol use disorder 04/21/2016  . History of partial pancreatectomy 11/24/2015    Past Surgical History:  Procedure Laterality Date  . EXPLORATORY LAPAROTOMY  2000s   "took out all my organs and washed the dead tissue"  . IVC FILTER PLACEMENT ([facility] HX)  12/2010?  Marland Kitchen LAPAROSCOPIC CHOLECYSTECTOMY  2000s    OB History    No data available       Home Medications    Prior to Admission medications   Medication Sig Start Date End Date Taking? Authorizing Provider  Albuterol Sulfate 108 (90 Base) MCG/ACT AEPB Inhale 2 puffs into the lungs as needed. Patient taking differently: Inhale 2 puffs into the lungs every 4 (four) hours as needed (for shortness of breath).  06/05/16  Yes Burns, Tinnie Gens, MD  Multiple Vitamins-Minerals (MULTIVITAMIN WITH MINERALS) tablet Take 1 tablet by mouth daily.   Yes [provider]  Pancrelipase,  Lip-Prot-Amyl, 24000-76000 units CPEP Take 2 capsules (48,000 Units total) by mouth 3 (three) times daily with meals. 08/28/16  Yes Rice, Jamesetta Orleans, MD  pantoprazole (PROTONIX) 40 MG tablet Take 1 tablet (40 mg total) by mouth daily. 09/07/16  Yes Gust Rung, DO  potassium chloride SA (K-DUR,KLOR-CON) 20 MEQ tablet Take 2 tablets (40 mEq total) by mouth daily. Patient taking differently: Take 20 mEq by mouth daily.  07/21/16  Yes Rivet, Iris Pert, MD  traMADol (ULTRAM) 50 MG tablet Take 1 tablet (50 mg total) by  mouth daily. Patient taking differently: Take 50 mg by mouth as needed for moderate pain.  01/10/17  Yes Deneise Lever, MD  phenylephrine (,USE FOR PREPARATION-H,) 0.25 % suppository Place 1 suppository rectally 2 (two) times daily. Patient not taking: Reported on 01/25/2017 01/10/17   Deneise Lever, MD  witch hazel-glycerin (TUCKS) pad Apply 1 application topically as needed for itching. Patient not taking: Reported on 01/19/2017 01/10/17   Deneise Lever, MD    Family History Family History  Problem Relation Age of Onset  . Adopted: Yes  . Family history unknown: Yes    Social History Social History  Substance Use Topics  . Smoking status: Current Some Day Smoker    Packs/day: 0.50    Years: 31.00    Types: Cigarettes  . Smokeless tobacco: Never Used     Comment: DOWN TO 2 CIGARETTES A DAY  . Alcohol use Yes     Comment: yesterday -- 2 drinks     Allergies   Morphine and Oxycodone-acetaminophen   Review of Systems Review of Systems  Constitutional: Positive for fatigue and unexpected weight change.  Gastrointestinal: Positive for abdominal pain, nausea and vomiting.  All other systems reviewed and are negative.    Physical Exam Updated Vital Signs BP 105/69   Pulse 89   Temp 98.2 F (36.8 C) (Rectal)   Resp 16   Ht 5\' 6"  (1.676 m)   Wt 57.6 kg (127 lb)   SpO2 97%   BMI 20.50 kg/m   Physical Exam  Constitutional: She is oriented to person, place, and time. She appears well-developed and well-nourished. No distress.  HENT:  Head: Normocephalic and atraumatic.  Neck: Neck supple.  Cardiovascular: Normal rate, regular rhythm and normal heart sounds.   No murmur heard. Pulmonary/Chest: Effort normal and breath sounds normal. No respiratory distress.  Abdominal: Soft. She exhibits no distension. There is tenderness.  Tenderness to palpation of upper abdomen. Negative Murphy's. No rebound tenderness.  Neurological: She is alert and oriented to person, place,  and time.  Skin: Skin is warm and dry.  Nursing note and vitals reviewed.    ED Treatments / Results  Labs (all labs ordered are listed, but only abnormal results are displayed) Labs Reviewed  COMPREHENSIVE METABOLIC PANEL - Abnormal; Notable for the following:       Result Value   Sodium 123 (*)    Potassium 3.3 (*)    Chloride 91 (*)    CO2 17 (*)    Glucose, Bld 128 (*)    BUN <5 (*)    Calcium 8.6 (*)    Albumin 2.8 (*)    AST 114 (*)    Alkaline Phosphatase 319 (*)    Total Bilirubin 2.0 (*)    All other components within normal limits  CBC - Abnormal; Notable for the following:    RBC 3.22 (*)    Hemoglobin 11.1 (*)    HCT 31.2 (*)  MCH 34.5 (*)    All other components within normal limits  URINALYSIS, ROUTINE W REFLEX MICROSCOPIC - Abnormal; Notable for the following:    Hgb urine dipstick SMALL (*)    Leukocytes, UA MODERATE (*)    Bacteria, UA RARE (*)    Squamous Epithelial / LPF 0-5 (*)    All other components within normal limits  I-STAT CG4 LACTIC ACID, ED - Abnormal; Notable for the following:    Lactic Acid, Venous 6.82 (*)    All other components within normal limits  I-STAT CG4 LACTIC ACID, ED - Abnormal; Notable for the following:    Lactic Acid, Venous 6.02 (*)    All other components within normal limits  I-STAT CHEM 8, ED - Abnormal; Notable for the following:    Sodium 129 (*)    Potassium 2.7 (*)    Chloride 96 (*)    BUN <3 (*)    Creatinine, Ser 0.40 (*)    Calcium, Ion 0.95 (*)    TCO2 16 (*)    Hemoglobin 9.2 (*)    HCT 27.0 (*)    All other components within normal limits  I-STAT CG4 LACTIC ACID, ED - Abnormal; Notable for the following:    Lactic Acid, Venous 3.33 (*)    All other components within normal limits  CULTURE, BLOOD (ROUTINE X 2)  CULTURE, BLOOD (ROUTINE X 2)  URINE CULTURE  LIPASE, BLOOD  MAGNESIUM  RAPID URINE DRUG SCREEN, HOSP PERFORMED  ETHANOL  I-STAT TROPONIN, ED    EKG  EKG Interpretation None         Radiology Ct Abdomen Pelvis W Contrast  Result Date: 01/25/2017 CLINICAL DATA:  Abdominal pain.  History of pancreatitis. EXAM: CT ABDOMEN AND PELVIS WITH CONTRAST TECHNIQUE: Multidetector CT imaging of the abdomen and pelvis was performed using the standard protocol following bolus administration of intravenous contrast. CONTRAST:  100mL ISOVUE-300 IOPAMIDOL (ISOVUE-300) INJECTION 61% COMPARISON:  CT abdomen pelvis 08/26/2016 FINDINGS: Lower chest: No pulmonary nodules or pleural effusion. No visible pericardial effusion. Hepatobiliary: Hepatic steatosis with sparing of a portion of the medial right lobe. There may also be a component of transient hepatic attenuation difference. Status post cholecystectomy. Pancreas: Diminished enhancement in the pancreatic neck. Mild edema in the peripancreatic fat. No peripancreatic fluid collection. No pancreatic ductal dilatation. Spleen: Normal. Adrenals/Urinary Tract: --Adrenal glands: Normal. --Right kidney/ureter: No hydronephrosis or perinephric stranding. No nephrolithiasis. No obstructing ureteral stones. --Left kidney/ureter: No hydronephrosis or perinephric stranding. No nephrolithiasis. No obstructing ureteral stones. --Urinary bladder: Unremarkable. Stomach/Bowel: --Stomach/Duodenum: No hiatal hernia or other gastric abnormality. Normal duodenal course and caliber. --Small bowel: No dilatation or inflammation. --Colon: No focal abnormality. --Appendix: Normal. Vascular/Lymphatic: There is an infrarenal IVC filter. Major abdominal arteries and veins are normal. The left common iliac vein is narrowed posterior to the origin of the common iliac arteries. Scattered subcentimeter retroperitoneal lymph nodes. Reproductive: Normal uterus and ovaries. Small amount of free fluid in the pelvis may be physiologic. Musculoskeletal. No bony spinal canal stenosis or focal osseous abnormality. Other: None. IMPRESSION: 1. Mild peripancreatic edema with loss of normal  enhancement in the pancreatic neck. This may indicate acute pancreatitis in the appropriate context. The loss of the normal enhancement could represent an early stage of pancreatic necrosis. Correlation with abdominal MRI with and without contrast might be helpful. 2. Hepatic steatosis. Electronically Signed   By: Deatra RobinsonKevin  Herman M.D.   On: 01/25/2017 19:50    Procedures Procedures (including critical care time)  Medications  Ordered in ED Medications  ondansetron (ZOFRAN) injection 4 mg (not administered)  potassium chloride 10 mEq in 100 mL IVPB (10 mEq Intravenous New Bag/Given 01/25/17 2140)  potassium chloride SA (K-DUR,KLOR-CON) CR tablet 40 mEq (40 mEq Oral Refused 01/25/17 2229)  ondansetron (ZOFRAN-ODT) disintegrating tablet 4 mg (4 mg Oral Given 01/25/17 1507)  sodium chloride 0.9 % bolus 1,000 mL (0 mLs Intravenous Stopped 01/25/17 1950)    Followed by  sodium chloride 0.9 % bolus 1,000 mL (0 mLs Intravenous Stopped 01/25/17 1950)  iopamidol (ISOVUE-300) 61 % injection (100 mLs  Contrast Given 01/25/17 1859)  fentaNYL (SUBLIMAZE) injection 50 mcg (50 mcg Intravenous Given 01/25/17 2156)     Initial Impression / Assessment and Plan / ED Course  I have reviewed the triage vital signs and the nursing notes.  Pertinent labs & imaging results that were available during my care of the patient were reviewed by me and considered in my medical decision making (see chart for details).    Julyssa Kyer is a 56 y.o. female who presents to ED for progressively worsening upper abdominal pain, nausea and vomiting x 2 months.   5:17 PM - Elevated lactic acid of 6.82. Patient is afebrile, HR in the 90's with normal white count. Patient complaining of abdominal pain, nausea and vomiting which has been progressively worsening over the last two months. Doubt this is of acute bacterial etiology. Dehydration likely contributory. Will obtain blood cx's, start IV fluids and obtain CT abd/pelvis for  further evaluation.   CT reviewed showing findings concerning for acute pancreatitis vs. Early stage of pancreatic necrosis.  Repeat lactic after 2L IV fluids improved to 3.33.   As patient is followed by GI primarily at [facility], I tried to consult the [facility] service to possibly transfer patient for continuity of care. The secretary called [facility] four times. I personally spoke with the transfer line and was told that GI would call me back, but unfortunately after 2+ hours have not spoken with them. Discussed case with IM teaching service who will admit at [facility] and consult GI in the morning.   Patient seen by and discussed with Dr. Criss Alvine who agrees with treatment plan.   Final Clinical Impressions(s) / ED Diagnoses   Final diagnoses:  Epigastric pain    New Prescriptions New Prescriptions   No medications on file     Brennden Masten, Chase Picket, PA-C 01/25/17 2303    Pricilla Loveless, MD 01/26/17 [phone]

## 2017-01-25 NOTE — ED Triage Notes (Signed)
Pt states that she has severe abd pain, hx pancreatitis -- had 2 drinks last night, has been vomiting "yellow" liquid since-- states that she has lost approx 50 pounds in 1 yr. Pt is pale, sclera jaundiced, vomiting at triage.

## 2017-01-25 NOTE — H&P (Signed)
Date: 01/26/2017               Patient Name:  Sharon Kent MRN: [id number]  DOB: [birth year] Age / Sex: 56 y.o., female   PCP: Gust Rung, DO         Medical Service: Internal Medicine Teaching Service         Attending Physician: Dr. Oswaldo Done, Marquita Palms, *    First Contact: Dr. Rozann Lesches Pager: [phone]  Second Contact: Dr. Geralyn Corwin Pager: [phone]       After Hours (After 5p/  First Contact Pager: [phone]  weekends / holidays): Second Contact Pager: [phone]   Chief Complaint: vomiting and abdominal pain  History of Present Illness: Sharon Kent is a 56yo female with PMH of chronic pancreatitis 2/2 chronic alcohol use and pancreas divisum, GERD, tobacco use, and asthma who presents with abdominal pain and vomiting for the last 2 months that acutely worsened yesterday evening.  She has a complicated pancreatic history. 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 Is followed by Dr. Ivan Croft at [facility]. She had an office visit with Dr. Ivan Croft on 12/21/16, at which time MRI of pancreas was obtained. MRI of pancreas demonstrated improved chronic pancreatitis with decreased pancreatic duct dilation and resolved peripancreatic fluid collections. She was told there was no need for surgical intervention at that time. She wanted a second opinion with a local GI doctor and is in the process of getting paperwork sent.  Her abdominal pain woke her up from sleep last night, is intermittent, and radiates to her back between her shoulder blades. She has tried heating pads and tramadol for  the pain, which did provide some relief. She endorses nausea and NB emesis x6 yesterday. Endorses chronic constipation; no diarrhea. Reports feeling cold for the last several months. Denies dysuria, chest pain, or shortness of breath.   For the last several months, she has had decreased appetite and vomiting. She is able to drink fluids. She reports progressive generalized weakness and difficulty moving around due to decreased strength. Since January 2018, she has lost ~60 lbs. Had a CT chest performed in Sept 2018, which showed two right lung nodules. She also asks about the possibility of a feeding tube to help her get her strength back. She states she has had an NG tube in the hospital, which kept getting pulled out and did not go well. She did have a PEG tube placed at the time of her initial stents, which she states was helpful.  She smokes ~2-3 cigarettes daily. Drinks ~1-2 drinks every week (patient reports that her GI doctor at [facility] stated this was okay). Last drink yesterday. Lives at home alone.  In the ER, BP 122/93, HR 90s, RR 22, temp 98.2, O2 100% on RA. CBC with Hb 11.1. CMP significant for Na 123, K 3.3, Cl 91, bicarb 17, AST 114, ALT 31, AP 319, tbil 2.0. Lipase 47. Lactic acid 6.02. Troponin neg. CT abdomen/pelvis with mild peripancreatic edema, concerning for possible acute pancreatitis vs pancreatic necrosis. Received 2L IV NS bolus.  Meds:  Current Meds  Medication Sig  . Albuterol Sulfate 108 (90 Base) MCG/ACT AEPB Inhale  2 puffs into the lungs as needed. (Patient taking differently: Inhale 2 puffs into the lungs every 4 (four) hours as needed (for shortness of breath). )  . Multiple Vitamins-Minerals (MULTIVITAMIN WITH MINERALS) tablet Take 1 tablet by mouth daily.  . Pancrelipase, Lip-Prot-Amyl, 24000-76000 units CPEP Take 2 capsules (48,000 Units total) by mouth 3 (three) times daily with meals.  . pantoprazole (PROTONIX) 40 MG tablet Take 1 tablet (40 mg total) by mouth daily.   . potassium chloride SA (K-DUR,KLOR-CON) 20 MEQ tablet Take 2 tablets (40 mEq total) by mouth daily. (Patient taking differently: Take 20 mEq by mouth daily. )  . traMADol (ULTRAM) 50 MG tablet Take 1 tablet (50 mg total) by mouth daily. (Patient taking differently: Take 50 mg by mouth as needed for moderate pain. )   Allergies: Allergies as of 01/25/2017 - Review Complete 01/25/2017  Allergen Reaction Noted  . Morphine Nausea Only 11/23/2015  . Oxycodone-acetaminophen Other (See Comments) 12/26/2012   Past Medical History:  Diagnosis Date  . Asthma   . Family history of adverse reaction to anesthesia       . GERD (gastroesophageal reflux disease)   . H/O chronic pancreatitis   . H/O ETOH abuse   . Headache    "monthly" (06/15/2016)  . History of blood transfusion 2000s   "when they explored my abdomen"  . Hx of pulmonary embolus   . Pancreatic abscess   . Pneumonia    "twice at least" (06/15/2016)    Family History:  Family History  Problem Relation Age of Onset  . Adopted: Yes  . Family history unknown: Yes   Social History:  Social History   Social History  . Marital status: Divorced    Spouse name: N/A  . Number of children: N/A  . Years of education: N/A   Social History Main Topics  . Smoking status: Current Some Day Smoker    Packs/day: 0.50    Years: 31.00    Types: Cigarettes  . Smokeless tobacco: Never Used     Comment: DOWN TO 2 CIGARETTES A DAY  . Alcohol use Yes     Comment: yesterday -- 2 drinks  . Drug use: No  . Sexual activity: Not Currently   Other Topics Concern  . None   Social History Narrative  . None   Review of Systems: A complete ROS was negative except as per HPI.  Physical Exam: Blood pressure 101/67, pulse 87, temperature 98.2 F (36.8 C), temperature source Rectal, resp. rate 11, height 5\' 6"  (1.676 m), weight 127 lb (57.6 kg), SpO2 95 %. GEN: Chronically ill-appearing pleasant female lying in bed in no acute distress. Alert  and oriented. Appears older than actual age, appears tired. Appears pale and jaundiced. HENT: /AT. Moist mucous membranes. No visible lesions. EYES: Conjunctiva clear. Sclera mildly icteric. RESP: Clear to auscultation bilaterally. No wheezes, rales, or rhonchi. No increased work of breathing. CV: Normal rate and regular rhythm. No murmurs, gallops, or rubs. ABD: Soft. Tender to palpation in LUQ > RUQ and epigastric area. Non-tender in lower quadrants. Non-distended. Normoactive bowel sounds. EXT: 1+ pitting edema in BLE. Warm and well perfused. SKIN: Warm and dry. Capillary refill ~3 sec. NEURO: Cranial nerves II-XII grossly intact. Able to lift all four extremities against gravity. No apparent audiovisual hallucinations. Speech fluent and appropriate.  Labs CBC Latest Ref Rng & Units 01/25/2017 01/25/2017 01/19/2017  WBC 4.0 - 10.5 K/uL - 9.1 7.1  Hemoglobin 12.0 - 15.0 g/dL 9.6(E9.2(L) 11.1(L)  10.7(L)  Hematocrit 36.0 - 46.0 % 27.0(L) 31.2(L) 31.1(L)  Platelets 150 - 400 K/uL - 341 242   CMP Latest Ref Rng & Units 01/25/2017 01/25/2017 01/19/2017  Glucose 65 - 99 mg/dL 92 161(W) 960(A)  BUN 6 - 20 mg/dL <5(W) <0(J) <8(J)  Creatinine 0.44 - 1.00 mg/dL 1.91(Y) 7.82 9.56  Sodium 135 - 145 mmol/L 129(L) 123(L) 128(L)  Potassium 3.5 - 5.1 mmol/L 2.7(LL) 3.3(L) 3.0(L)  Chloride 101 - 111 mmol/L 96(L) 91(L) 95(L)  CO2 22 - 32 mmol/L - 17(L) 22  Calcium 8.9 - 10.3 mg/dL - 8.6(L) 8.8(L)  Total Protein 6.5 - 8.1 g/dL - 7.2 6.7  Total Bilirubin 0.3 - 1.2 mg/dL - 2.0(H) 2.5(H)  Alkaline Phos 38 - 126 U/L - 319(H) 303(H)  AST 15 - 41 U/L - 114(H) 115(H)  ALT 14 - 54 U/L - 31 29   Lipase 47 Lactic acid 6.82 -> 6.02 -> 3.33 UA with small Hgb, moderate leukocytes, rare bacteria Troponin neg Ethyl alcohol <10 BCx, UCx pending  CT abd/pelvis 10/19 1. Mild peripancreatic edema with loss of normal enhancement in the pancreatic neck. This may indicate acute pancreatitis in the appropriate  context. The loss of the normal enhancement could represent an early stage of pancreatic necrosis. Correlation with abdominal MRI with and without contrast might be helpful. 2. Hepatic steatosis.  MRI abdomen 10/1 Improved chronic pancreatitis with decrease pancreatic duct dilation. Interval resolution of peripancreatic fluid collections. No focal pancreatic lesions identified.  Assessment & Plan by Problem: Active Problems:   Abdominal pain  Sharon Kent is a 56yo female with PMH of chronic pancreatitis 2/2 chronic alcohol use and pancreas divisum, GERD, tobacco use, and asthma who presents with a several month history of abdominal pain and emesis that worsened yesterday evening. Her health has significantly declined over the last few months with decreased appetite, 60lb weight loss, and progressive generalized weakness.  Abdominal pain and emesis Pt has had several months of abdominal pain and emesis, the timing of which may coincide with the removal of her 2 pancreatic stents in July. Lipase 47. Lactic acid 6.88 on admission, now downtrending after 2L IVF. Likely chronic pancreatitis. Differential includes cirrhosis, gastritis, PUD, chronic mesenteric ischemia. MRI two weeks ago with improved chronic pancreatitis and resolved peripancreatic fluid collections. CT abdomen today with peripancreatic edema. Additionally, loss of normal enhancement of pancreatic neck seen on CT today was also seen on MRI from two weeks ago. Will not get MRI abdomen here because of similar findings on CT compared to MRI and VSS. If she does require stent/surgical intervention, she will need to be transferred to higher level of care. - IV NS 166mL/hr  - dilaudid 1mg  q4h PRN & K pad - zofran 4mg  - protonix 40mg  - UDS - PT/INR, CMET, CBC in AM - May need PEG tube to help with nutrition - Attempted to contact [facility] - no answer. May need to try to reach them tomorrow.  Chronic pancreatitis, 2/2 chronic alcohol use  and pancreas divisum - Continue home Creon [id number] TID  Hypokalemia Chronic. Likely secondary to chronic pancreatitis and dehydration with poor PO intake and multiple bouts of emesis. S/p 2L NS bolus in ER. - Hypokalemia (K 2.7): IV potassium & PO - IV NS 132mL/hr - Mg  Alcohol use disorder  Continues to drink ~1-2 drinks per week. States this is okay per GI doctor. Last drink yesterday. - CIWA  Diet: CLD VTE PPx: Lovenox Dispo: Admit patient to  Inpatient with expected length of stay greater than 2 midnights.  Signed: Scherrie Gerlach, MD 01/26/2017, 12:50 AM

## 2017-01-25 NOTE — ED Notes (Signed)
Pt taken to CT.

## 2017-01-26 DIAGNOSIS — K860 Alcohol-induced chronic pancreatitis: Secondary | ICD-10-CM

## 2017-01-26 DIAGNOSIS — K852 Alcohol induced acute pancreatitis without necrosis or infection: Principal | ICD-10-CM

## 2017-01-26 DIAGNOSIS — F1010 Alcohol abuse, uncomplicated: Secondary | ICD-10-CM

## 2017-01-26 DIAGNOSIS — E876 Hypokalemia: Secondary | ICD-10-CM

## 2017-01-26 LAB — CBC
HCT: 25.8 % — ABNORMAL LOW (ref 36.0–46.0)
HEMOGLOBIN: 8.9 g/dL — AB (ref 12.0–15.0)
MCH: 33.8 pg (ref 26.0–34.0)
MCHC: 34.5 g/dL (ref 30.0–36.0)
MCV: 98.1 fL (ref 78.0–100.0)
Platelets: 221 10*3/uL (ref 150–400)
RBC: 2.63 MIL/uL — ABNORMAL LOW (ref 3.87–5.11)
RDW: 14.9 % (ref 11.5–15.5)
WBC: 6.7 10*3/uL (ref 4.0–10.5)

## 2017-01-26 LAB — COMPREHENSIVE METABOLIC PANEL
ALBUMIN: 2.3 g/dL — AB (ref 3.5–5.0)
ALK PHOS: 240 U/L — AB (ref 38–126)
ALT: 22 U/L (ref 14–54)
ANION GAP: 9 (ref 5–15)
AST: 86 U/L — AB (ref 15–41)
BILIRUBIN TOTAL: 2.1 mg/dL — AB (ref 0.3–1.2)
BUN: <5 mg/dL — ABNORMAL LOW (ref 6–20)
CALCIUM: 7 mg/dL — AB (ref 8.9–10.3)
CO2: 16 mmol/L — AB (ref 22–32)
CREATININE: 0.59 mg/dL (ref 0.44–1.00)
Chloride: 103 mmol/L (ref 101–111)
GFR calc Af Amer: >60 mL/min (ref >60)
GFR calc non Af Amer: >60 mL/min (ref >60)
GLUCOSE: 97 mg/dL (ref 65–99)
Potassium: 3.6 mmol/L (ref 3.5–5.1)
SODIUM: 128 mmol/L — AB (ref 135–145)
TOTAL PROTEIN: 5.7 g/dL — AB (ref 6.5–8.1)

## 2017-01-26 LAB — APTT: APTT: 38 s — AB (ref 24–36)

## 2017-01-26 LAB — RAPID URINE DRUG SCREEN, HOSP PERFORMED
Amphetamines: NOT DETECTED
BARBITURATES: NOT DETECTED
BENZODIAZEPINES: NOT DETECTED
Cocaine: NOT DETECTED
Opiates: NOT DETECTED
TETRAHYDROCANNABINOL: NOT DETECTED

## 2017-01-26 LAB — PROTIME-INR
INR: 1.4
Prothrombin Time: 17.1 seconds — ABNORMAL HIGH (ref 11.4–15.2)

## 2017-01-26 LAB — CBG MONITORING, ED: Glucose-Capillary: 92 mg/dL (ref 65–99)

## 2017-01-26 MED ORDER — HYDROCORTISONE 2.5 % RE CREA
TOPICAL_CREAM | Freq: Four times a day (QID) | RECTAL | Status: DC
Start: 2017-01-26 — End: 2017-02-03
  Administered 2017-01-26 – 2017-01-27 (×5): via RECTAL
  Administered 2017-01-28 (×2): 1 via RECTAL
  Administered 2017-01-28 – 2017-01-29 (×2): via RECTAL
  Administered 2017-01-29: 1 via RECTAL
  Administered 2017-01-30 – 2017-01-31 (×6): via RECTAL
  Administered 2017-01-31: 1 via RECTAL
  Administered 2017-01-31 – 2017-02-01 (×2): via RECTAL
  Administered 2017-02-01: 1 via RECTAL
  Administered 2017-02-01 – 2017-02-03 (×7): via RECTAL
  Filled 2017-01-26 (×2): qty 28.35

## 2017-01-26 MED ORDER — PANCRELIPASE (LIP-PROT-AMYL) 36000-114000 UNITS PO CPEP
72000.0000 [IU] | ORAL_CAPSULE | Freq: Three times a day (TID) | ORAL | Status: DC
  Administered 2017-01-27 – 2017-02-03 (×19): 72000 [IU] via ORAL
  Filled 2017-01-26 (×24): qty 2

## 2017-01-26 MED ORDER — SODIUM CHLORIDE 0.9 % IV SOLN
INTRAVENOUS | Status: DC
  Administered 2017-01-26 (×2): via INTRAVENOUS

## 2017-01-26 MED ORDER — ONDANSETRON HCL 4 MG/2ML IJ SOLN
4.0000 mg | Freq: Four times a day (QID) | INTRAMUSCULAR | Status: DC | PRN
  Administered 2017-01-26 – 2017-01-30 (×2): 4 mg via INTRAVENOUS
  Filled 2017-01-26 (×2): qty 2

## 2017-01-26 NOTE — ED Notes (Signed)
Pt ambulated self efficiently to restroom with no difficulties. 

## 2017-01-26 NOTE — ED Notes (Signed)
Meal tray provided.

## 2017-01-26 NOTE — Progress Notes (Signed)
   Subjective:  Patient was seen laying in bed comfortably in no acute distress. Patient states that she continues to have abdominal pain, but that her pain was well managed with medications overnight. She denies recent episodes of emesis since hospitalization.   Objective:  Vital signs in last 24 hours: Vitals:   01/26/17 0730 01/26/17 0800 01/26/17 0930 01/26/17 1030  BP: 95/67 101/76 (!) 99/58 96/63  Pulse: 82 76 78 78  Resp: 20 12 14 15   Temp:      TempSrc:      SpO2: 93% 100% 98% 96%  Weight:      Height:       Physical Exam  Constitutional:  Woman who appears older than stated age sitting comfortably in bed in no acute distress.  HENT:  Oropharynx clear but dry  Cardiovascular: Normal rate, regular rhythm and intact distal pulses.  Exam reveals no friction rub.   No murmur heard. Pulmonary/Chest: Effort normal. No respiratory distress. She has no wheezes.  Abdominal: Soft. Bowel sounds are normal. She exhibits no distension. There is tenderness (Epigastric, LUQ, and LLQ).  Musculoskeletal: She exhibits no edema (of bilateral lower extremities) or tenderness (of bilateral lower extremities).  Skin: Skin is warm and dry. No rash noted. No erythema.   Assessment/Plan:  Active Problems:   Acute on chronic pancreatitis (HCC)  Sharon Kent is a 56 yo with a PMH of chronic pancreatitis 2/2 alcohol use and pancreas divisum, GERD, asthma, and tobacco use who is presenting was acute worsening of chronic abdominal pain and increased nausea/vomiting which occurred during the two days leading up to admission. She was admitted to the internal medicine teaching service for management. The specific problems addressed during admission are as follows:  Acute on chronic pancreatitis: Per chart review patient has chronic abdominal pain and multiple hospitalizations/episodes of pancreatitis. This patient reports her current episode of abdominal pain and nausea feels similar to past episodes  of acute pancreatitis, but less severe in nature. Given patient's history of chronic pancreatitis, story of LUQ pain that radiates to her back, physical exam with epigastric and LUQ pain, and imaging findings showing peripancreatic edema, it is most likely that the patient's current presentation is a result of acute pancreatitis. Will treat with IV fluids and pain medicine as needed. Will also provide IV zofran for nausea while patient having difficulty tolerating PO fluids. Will hold off GI consult, since patient has been drinking alcohol leading up to this acute exacerbation of chronic pancreatitis. Patient was counseled to decrease alcohol intake and see how her chronic pain and nutritional status improve. -NS @ rate of 125 ml/hr -Continue Dilaudid 1 mg q 4 hours for pain -Continue home Creon, 72,000 Units TID with meals  Hypokalemia: On admission K was 2.7 and IV repletion was given. Patient's AM labs showed K = 3.6.  -Mg pending -BMP in AM  Hx of alcohol use: Patient reports drinking 1-2 drinks per day previously, alcohol level <10 on admission. -CIWA while inpatient. -UDS negative  Fluids: NS @ rate of 125 mL/hr Diet: Advance as tolerated VTE prophylaxis: Lovenox  Dispo: Anticipated discharge in approximately 3-4 day(s) pending improvement in abdominal pain and tolerance of oral medications/diet while inpatient.  Sharon Kent, Sharon Margulies, MD 01/26/2017, 11:20 AM Pager: [phone]

## 2017-01-26 NOTE — Progress Notes (Signed)
PT blood pressure was 92/52 manuel. Called MD and MD said that she normally run low. Will cont. To monitor.

## 2017-01-27 DIAGNOSIS — K8689 Other specified diseases of pancreas: Secondary | ICD-10-CM | POA: Diagnosis present

## 2017-01-27 DIAGNOSIS — K861 Other chronic pancreatitis: Secondary | ICD-10-CM

## 2017-01-27 DIAGNOSIS — K859 Acute pancreatitis without necrosis or infection, unspecified: Secondary | ICD-10-CM

## 2017-01-27 LAB — COMPREHENSIVE METABOLIC PANEL
ALBUMIN: 2 g/dL — AB (ref 3.5–5.0)
ALK PHOS: 213 U/L — AB (ref 38–126)
ALT: 20 U/L (ref 14–54)
AST: 77 U/L — AB (ref 15–41)
Anion gap: 6 (ref 5–15)
BILIRUBIN TOTAL: 2 mg/dL — AB (ref 0.3–1.2)
CALCIUM: 7 mg/dL — AB (ref 8.9–10.3)
CO2: 17 mmol/L — AB (ref 22–32)
CREATININE: 0.57 mg/dL (ref 0.44–1.00)
Chloride: 107 mmol/L (ref 101–111)
GFR calc Af Amer: >60 mL/min (ref >60)
GLUCOSE: 93 mg/dL (ref 65–99)
Potassium: 2.9 mmol/L — ABNORMAL LOW (ref 3.5–5.1)
Sodium: 130 mmol/L — ABNORMAL LOW (ref 135–145)
Total Protein: 4.9 g/dL — ABNORMAL LOW (ref 6.5–8.1)

## 2017-01-27 LAB — GLUCOSE, CAPILLARY
Glucose-Capillary: 88 mg/dL (ref 65–99)
Glucose-Capillary: 81 mg/dL (ref 65–99)

## 2017-01-27 LAB — BASIC METABOLIC PANEL
ANION GAP: 11 (ref 5–15)
BUN: <5 mg/dL — ABNORMAL LOW (ref 6–20)
CHLORIDE: 105 mmol/L (ref 101–111)
CO2: 10 mmol/L — AB (ref 22–32)
Calcium: 7.2 mg/dL — ABNORMAL LOW (ref 8.9–10.3)
Creatinine, Ser: 0.67 mg/dL (ref 0.44–1.00)
GFR calc Af Amer: >60 mL/min (ref >60)
GFR calc non Af Amer: >60 mL/min (ref >60)
Glucose, Bld: 115 mg/dL — ABNORMAL HIGH (ref 65–99)
POTASSIUM: 3.7 mmol/L (ref 3.5–5.1)
Sodium: 126 mmol/L — ABNORMAL LOW (ref 135–145)

## 2017-01-27 LAB — URINE CULTURE

## 2017-01-27 LAB — MAGNESIUM: Magnesium: 1.1 mg/dL — ABNORMAL LOW (ref 1.7–2.4)

## 2017-01-27 MED ORDER — POTASSIUM CHLORIDE 10 MEQ/100ML IV SOLN
10.0000 meq | INTRAVENOUS | Status: AC
  Administered 2017-01-27 (×6): 10 meq via INTRAVENOUS
  Filled 2017-01-27 (×6): qty 100

## 2017-01-27 MED ORDER — MAGNESIUM SULFATE 2 GM/50ML IV SOLN
2.0000 g | Freq: Once | INTRAVENOUS | Status: AC
  Administered 2017-01-27: 2 g via INTRAVENOUS
  Filled 2017-01-27: qty 50

## 2017-01-27 MED ORDER — POTASSIUM CHLORIDE IN NACL 20-0.45 MEQ/L-% IV SOLN
INTRAVENOUS | Status: DC
  Administered 2017-01-27 (×2): via INTRAVENOUS
  Filled 2017-01-27 (×3): qty 1000

## 2017-01-27 NOTE — Progress Notes (Signed)
Subjective:  Patient was seen sitting comfortably in bed this morning in no acute distress. She states that her abdominal pain is much improved and she had no episodes of vomiting yesterday or overnight. Patient states that she is hungry today and that she wants to eat a full lunch today. She continues to endorse hemorrhoid pain but states that the cream provided to her yesterday has significantly improved this pain.   Objective:  Vital signs in last 24 hours: Vitals:   01/26/17 1345 01/26/17 1455 01/26/17 2300 01/27/17 0447  BP: 99/73 103/64 (!) 90/52 111/62  Pulse: (!) 118 77 78 80  Resp: 16   16  Temp: 97.8 F (36.6 C) 98.1 F (36.7 C) 97.8 F (36.6 C) 97.6 F (36.4 C)  TempSrc: Oral Oral Oral Oral  SpO2: 100% 100% 98% 99%  Weight:      Height:       Physical Exam  Constitutional:  Thin woman who appears older than stated age sitting on edge of bed in no acute distress  HENT:  Mouth/Throat: Oropharynx is clear and moist.  Cardiovascular: Normal rate, regular rhythm and intact distal pulses.  Exam reveals no friction rub.   No murmur heard. Pulmonary/Chest: Effort normal. No respiratory distress. She has no wheezes.  No crackles appreciated  Abdominal: Soft.  Hyperactive bowel sounds. Well healed surgical scars on abdomen. Small, 2cm ventral hernia that is non tender and easiy reducible in midline of lower abdomen. Epigastric and LUQ tenderness, interval improvement from previous exams.  Musculoskeletal: She exhibits no edema (of bilateral lower extremities) or tenderness (of bilateral lower extremities).  Skin: Skin is warm and dry. No rash noted. No erythema.   Assessment/Plan:  Active Problems:   Acute on chronic pancreatitis (HCC)  Sharon Kent is a 56 yo with a PMH of chronic pancreatitis 2/2 alcohol use and pancreas divisum, GERD, asthma, and tobacco use who is presenting was acute worsening of chronic abdominal pain and nausea/vomiting. She was admitted to the  internal medicine teaching service for management. The specific problems addressed during admission are as follows:  Acute on chronic pancreatitis: Patient's initial nausea, vomiting, and epigastric/LUQ pain improving on current pain regimen. Her pain was well controlled overnight with no episodes of vomiting. Will advance diet as tolerated today and see how she does clinically with PO intake today.  -1/2 NS with 20 meq KCl @ rate of 125 ml/hr -Continue Dilaudid 1 mg q 4 hours for pain -Continue home Creon, 72,000 Units TID with meals  Hypokalemia, chronic: On admission K+ was 2.7 and IV repletion was given, with good response yesterday (K+ = 3.6 on morning labs). Patient refused home oral dose of K supplementation overnight. Patient's AM labs showed K+ = 2.9. Mg2+ not drawn after admission, so will add on to this AM labs.  -Follow up add-on Mg2+ today, will replete as needed -IV K+, 10 mEq per hour over 6 hours -Follow up BMP at 1800 to ensure not hyperkalemic -Plan to increase patient's outpatient regimen of 40 meq daily to BID tomorrow after IV replacement today  Hemorrhoids: Chronic problem for patient per chart review, with recent visit to [facility] for management. Will provide outpatient regimen of hydrocortisone 2.5% rectal cream while inpatient and continue to monitor.   Hx of alcohol use: Patient reports drinking 1-2 drinks per day previously. No history of DT's per patient report.  -CIWA while inpatient  Diet: Advance as tolerated, currently full liquid VTE prophylaxis: Lovenox Code: Full  Dispo:  Anticipated discharge in approximately 3-4 day(s) pending clinical improvement.  Rozann LeschesNedrud, Tawonna Esquer, MD 01/27/2017, 7:39 AM Pager: [phone]

## 2017-01-28 DIAGNOSIS — K649 Unspecified hemorrhoids: Secondary | ICD-10-CM

## 2017-01-28 LAB — BASIC METABOLIC PANEL
ANION GAP: 7 (ref 5–15)
BUN: <5 mg/dL — ABNORMAL LOW (ref 6–20)
CALCIUM: 7.5 mg/dL — AB (ref 8.9–10.3)
CO2: 14 mmol/L — AB (ref 22–32)
Chloride: 108 mmol/L (ref 101–111)
Creatinine, Ser: 0.63 mg/dL (ref 0.44–1.00)
GFR calc Af Amer: >60 mL/min (ref >60)
GFR calc non Af Amer: >60 mL/min (ref >60)
GLUCOSE: 119 mg/dL — AB (ref 65–99)
POTASSIUM: 3.9 mmol/L (ref 3.5–5.1)
Sodium: 129 mmol/L — ABNORMAL LOW (ref 135–145)

## 2017-01-28 LAB — MAGNESIUM: Magnesium: 1.5 mg/dL — ABNORMAL LOW (ref 1.7–2.4)

## 2017-01-28 MED ORDER — PRAMOXINE HCL 1 % RE FOAM
Freq: Three times a day (TID) | RECTAL | Status: DC | PRN
  Filled 2017-01-28: qty 15

## 2017-01-28 MED ORDER — WITCH HAZEL-GLYCERIN EX PADS
MEDICATED_PAD | CUTANEOUS | Status: DC | PRN
  Filled 2017-01-28: qty 100

## 2017-01-28 MED ORDER — POTASSIUM CHLORIDE CRYS ER 20 MEQ PO TBCR
40.0000 meq | EXTENDED_RELEASE_TABLET | Freq: Two times a day (BID) | ORAL | Status: DC
  Administered 2017-01-28 (×2): 40 meq via ORAL
  Administered 2017-01-29: 20 meq via ORAL
  Administered 2017-01-30 – 2017-02-03 (×8): 40 meq via ORAL
  Filled 2017-01-28 (×11): qty 2

## 2017-01-28 MED ORDER — MAGNESIUM SULFATE 2 GM/50ML IV SOLN
2.0000 g | Freq: Once | INTRAVENOUS | Status: AC
  Administered 2017-01-28: 2 g via INTRAVENOUS
  Filled 2017-01-28: qty 50

## 2017-01-28 MED ORDER — INFLUENZA VAC SPLIT QUAD 0.5 ML IM SUSY
0.5000 mL | PREFILLED_SYRINGE | INTRAMUSCULAR | Status: DC
  Filled 2017-01-28: qty 0.5

## 2017-01-28 NOTE — Progress Notes (Signed)
   01/28/17 1930  What Happened  Was fall witnessed? No  Was patient injured? No  Patient found on floor  Found by Staff-comment (Initially NTs at shift change then RNs)  Stated prior activity ambulating-unassisted  Follow Up  MD notified (MD on call Family Medicine)  Time MD notified 2006  Family notified No- patient refusal  Adult Fall Risk Assessment  Risk Factor Category (scoring not indicated) Fall has occurred during this admission (document High fall risk)  Age 56  Fall History: Fall within 6 months prior to admission 0  Elimination; Bowel and/or Urine Incontinence 0  Elimination; Bowel and/or Urine Urgency/Frequency 0  Medications: includes PCA/Opiates, Anti-convulsants, Anti-hypertensives, Diuretics, Hypnotics, Laxatives, Sedatives, and Psychotropics 0  Patient Care Equipment 1  Mobility-Assistance 0  Mobility-Gait 0  Mobility-Sensory Deficit 0  Altered awareness of immediate physical environment 0  Impulsiveness 0  Lack of understanding of one's physical/cognitive limitations 0  Total Score 1  Patient's Fall Risk High Fall Risk (>13 points)  Adult Fall Risk Interventions  Required Bundle Interventions *See Row Information* High fall risk - low, moderate, and high requirements implemented  Additional Interventions Use of appropriate toileting equipment (bedpan, BSC, etc.)  Screening for Fall Injury Risk  Risk For Fall Injury- See Row Information  None identified  Pain Assessment  Pain Assessment 0-10  Pain Score 0  PCA/Epidural/Spinal Assessment  Respiratory Pattern Regular;Unlabored  Neurological  Neuro (WDL) WDL  Level of Consciousness Alert  Glasgow Coma Scale  Eye Opening 4  Best Verbal Response (NON-intubated) 5  Best Motor Response 6  Glasgow Coma Scale Score 15  Musculoskeletal  Musculoskeletal (WDL) WDL  Integumentary  Integumentary (WDL) X  Skin Color Appropriate for ethnicity  Skin Condition Dry  Skin Integrity Ecchymosis  Ecchymosis Location  Arm;Leg  Ecchymosis Location Orientation Right;Left  Ecchymosis Intervention Other (Comment) (open to air)  Skin Turgor Non-tenting

## 2017-01-28 NOTE — Evaluation (Signed)
Physical Therapy Evaluation Patient Details Name: Sharon Kent MRN: [id number] DOB: 1961-01-21 Today's Date: 01/28/2017   History of Present Illness  Sharon Kent is a 56 yo with a PMH of chronic pancreatitis 2/2 alcohol use and pancreas divisum, GERD, asthma, and tobacco use who is presenting was acute worsening of chronic abdominal pain and nausea/vomiting.  Clinical Impression  Patient presents with decreased mobility due to issues listed in PT problem list.  She currently needs min A for safety with balance and utilized walker for short distance ambulation in hallway with SOB and severe abdominal pain.  Reports she cannot go home as she has flight of stairs to enter her home and has previously had much difficulty accessing her home.  Also, she reports no one can help her at home and that she has had two falls at least in the past 6 months.  Feel she may need SNF level rehab at d/c until able to manage on her own.  Will follow acutely as well.     Follow Up Recommendations SNF    Equipment Recommendations  Rolling walker with 5" wheels    Recommendations for Other Services       Precautions / Restrictions Precautions Precautions: Fall      Mobility  Bed Mobility               General bed mobility comments: sitting EOB and remained at EOB end of session  Transfers Overall transfer level: Needs assistance Equipment used: None;Rolling walker (2 wheeled) Transfers: Sit to/from Stand Sit to Stand: Min guard         General transfer comment: for balance, initially refusing walker, but then trying to hold IV pole, so used walker  Ambulation/Gait Ambulation/Gait assistance: Min assist Ambulation Distance (Feet): 60 Feet Assistive device: Rolling walker (2 wheeled) Gait Pattern/deviations: Step-through pattern;Decreased stride length;Trunk flexed;Shuffle     General Gait Details: limited distance due to pain, pt SOB, SpO2 91% on RA  Stairs             Wheelchair Mobility    Modified Rankin (Stroke Patients Only)       Balance Overall balance assessment: Needs assistance Sitting-balance support: No upper extremity supported Sitting balance-Leahy Scale: Good     Standing balance support: No upper extremity supported Standing balance-Leahy Scale: Fair Standing balance comment: static balance okay, then with ambulation needs UE support                             Pertinent Vitals/Pain Pain Assessment: 0-10 Pain Score: 10-Worst pain ever Pain Location: abdominal pain Pain Descriptors / Indicators: Discomfort;Guarding;Grimacing Pain Intervention(s): Monitored during session;Repositioned    Home Living Family/patient expects to be discharged to:: Private residence Living Arrangements: Alone   Type of Home: Apartment Home Access: Stairs to enter Entrance Stairs-Rails: Right Entrance Stairs-Number of Steps: 14 Home Layout: One level Home Equipment: None      Prior Function Level of Independence: Independent         Comments: been having lot of difficulty getting up the stairs, does not feel she can manage to get up them safely     Hand Dominance        Extremity/Trunk Assessment   Upper Extremity Assessment Upper Extremity Assessment: RUE deficits/detail;LUE deficits/detail RUE Deficits / Details: AROM limited shoulder elevation to about 100 with pain on overpressure, strength grossly 3+/5 LUE Deficits / Details: AROM limited shoulder elevation to about 100 with pain  on overpressure, strength grossly 3+/5    Lower Extremity Assessment Lower Extremity Assessment: RLE deficits/detail;LLE deficits/detail RLE Deficits / Details: AAROM WFL, strength hip flexion 2/5, knee extension 3+/5, ankle DF 3+/5 LLE Deficits / Details: AAROM WFL, strength hip flexion 2/5, knee extension 3+/5, ankle DF 3+/5    Cervical / Trunk Assessment Cervical / Trunk Assessment: Kyphotic  Communication   Communication:  No difficulties  Cognition Arousal/Alertness: Awake/alert Behavior During Therapy: Flat affect Overall Cognitive Status: Within Functional Limits for tasks assessed                                        General Comments      Exercises     Assessment/Plan    PT Assessment Patient needs continued PT services  PT Problem List Decreased activity tolerance;Decreased balance;Decreased knowledge of use of DME;Pain;Decreased coordination;Decreased mobility;Decreased strength       PT Treatment Interventions DME instruction;Gait training;Therapeutic exercise;Patient/family education;Therapeutic activities;Balance training;Stair training;Functional mobility training    PT Goals (Current goals can be found in the Care Plan section)  Acute Rehab PT Goals Patient Stated Goal: Does not feel ready to go home PT Goal Formulation: With patient Time For Goal Achievement: 02/04/17 Potential to Achieve Goals: Fair    Frequency Min 3X/week   Barriers to discharge Decreased caregiver support      Co-evaluation               AM-PAC PT "6 Clicks" Daily Activity  Outcome Measure Difficulty turning over in bed (including adjusting bedclothes, sheets and blankets)?: None Difficulty moving from lying on back to sitting on the side of the bed? : A Lot Difficulty sitting down on and standing up from a chair with arms (e.g., wheelchair, bedside commode, etc,.)?: A Little Help needed moving to and from a bed to chair (including a wheelchair)?: A Little Help needed walking in hospital room?: A Little Help needed climbing 3-5 steps with a railing? : A Lot 6 Click Score: 17    End of Session Equipment Utilized During Treatment: Gait belt Activity Tolerance: Patient limited by fatigue Patient left: in bed;with call bell/phone within reach   PT Visit Diagnosis: Unsteadiness on feet (R26.81);History of falling (Z91.81);Other symptoms and signs involving the nervous system  (R29.898)    Time: 1610-9604 PT Time Calculation (min) (ACUTE ONLY): 26 min   Charges:   PT Evaluation $PT Eval Moderate Complexity: 1 Mod PT Treatments $Gait Training: 8-22 mins   PT G CodesSheran Lawless, [place] [phone] 01/28/2017   Elray Mcgregor 01/28/2017, 2:35 PM

## 2017-01-28 NOTE — Progress Notes (Signed)
   Subjective:  Patient seen sitting comfortably on edge of bed this AM in no acute distress. Patient states that she was able to tolerate full liquids without nausea and significant abdominal pain. She still endorses hemorrhoid pain, but that is at her baseline. Patient ready to start solid foods today.   Objective:  Vital signs in last 24 hours: Vitals:   01/27/17 0447 01/27/17 1439 01/27/17 2150 01/28/17 0512  BP: 111/62 102/61 (!) 95/57 100/60  Pulse: 80 94 99 99  Resp: 16 18 16 16   Temp: 97.6 F (36.4 C) 97.7 F (36.5 C) 98.1 F (36.7 C) 97.6 F (36.4 C)  TempSrc: Oral Oral Oral Oral  SpO2: 99% 100% 93% 92%  Weight:    147 lb 12.8 oz (67 kg)  Height:       Physical Exam  Constitutional:  Thin woman who appears older than stated age in no acute distress  HENT:  Mouth/Throat: Oropharynx is clear and moist.  Cardiovascular: Normal rate and regular rhythm.  Exam reveals no friction rub.   No murmur heard. Pulmonary/Chest: Effort normal. No respiratory distress. She has no wheezes. She has no rales.  Abdominal: Soft. She exhibits no distension. There is tenderness (epigastric and LUQ, interval improvement from yesterday's exam). There is no guarding.  Patient has small to moderate sized ventral hernia in center of lower abdomen that is nonpainful and easily reducible, stable from previous examinations.  Musculoskeletal: She exhibits no edema (of bilateral lower extremities) or tenderness (of bilateral lower extremities).  Skin: Skin is warm and dry. No rash noted. No erythema.   Assessment/Plan:  Principal Problem:   Acute on chronic pancreatitis (HCC) Active Problems:   Alcohol use disorder, mild, abuse   Hypokalemia   Pancreatic insufficiency  Sharon Kent is a 56 yo with a PMH of chronic pancreatitis 2/2 alcohol use and pancreas divisum, GERD, asthma, and tobacco use who is presenting was acute worsening of chronic abdominal pain and nausea/vomiting. She was  admitted to the internal medicine teaching service for management. The specific problems addressed during admission are as follows:  Acute on chronic pancreatitis: Patient's initial nausea, vomiting, and epigastric/LUQ pain has greatly improved during hospitalization and she is now tolerating a full liquid diet. Patient's abdominal pain significantly improved and her complaints of pain are mostly related to chronic hemorrhoids. Will discontinue PRN pain medication today to see how she does clinically of this medication. Will also discontinue IVF as patient tolerating full liquids and appears well hydrated on exam. Will try solid foods today. Plan to obtain PT evaluation because patient feels weak as a result of this hospitalization and decreased PO intake leading up to admission.  -Advance diet as tolerated -Continue home Creon, 72,000 Units TID with meals -PT consult for evaluation/treatment of weakness in setting of hospitalization and chronic malnutrition with alcohol use  Hypokalemia, chronic: Labs today showed K = 3.9 with Mg = 1.5. Will plan to continue with repletion of Magnesium today and oral K therapy now that patient tolerating oral diet/medications. -Will increase outpatient K regimen to 40 mEq BID since patient arrived hypokalemic -BMP, Mg tomorrow AM  Hemorrhoids:  -2.5% hydrocortisone cream PRN  Hx of alcohol use:  -CIWA while inpatient -Daily multivitamin  Diet:Advance as tolerated to heart healthy diet VTE prophylaxis: Lovenox Code: Full  Dispo: Anticipated discharge in approximately 1-2 day(s).   Rozann LeschesNedrud, Tomothy Eddins, MD 01/28/2017, 11:06 AM Pager: [phone]

## 2017-01-29 ENCOUNTER — Inpatient Hospital Stay (HOSPITAL_COMMUNITY): Payer: Managed Care, Other (non HMO)

## 2017-01-29 ENCOUNTER — Encounter (HOSPITAL_COMMUNITY): Payer: Self-pay | Admitting: *Deleted

## 2017-01-29 DIAGNOSIS — Z820 Family history of epilepsy and other diseases of the nervous system: Secondary | ICD-10-CM

## 2017-01-29 DIAGNOSIS — M79672 Pain in left foot: Secondary | ICD-10-CM

## 2017-01-29 DIAGNOSIS — Z9181 History of falling: Secondary | ICD-10-CM

## 2017-01-29 DIAGNOSIS — K439 Ventral hernia without obstruction or gangrene: Secondary | ICD-10-CM

## 2017-01-29 DIAGNOSIS — M79671 Pain in right foot: Secondary | ICD-10-CM

## 2017-01-29 DIAGNOSIS — E871 Hypo-osmolality and hyponatremia: Secondary | ICD-10-CM

## 2017-01-29 LAB — BASIC METABOLIC PANEL
Anion gap: 7 (ref 5–15)
CO2: 13 mmol/L — ABNORMAL LOW (ref 22–32)
Calcium: 7.8 mg/dL — ABNORMAL LOW (ref 8.9–10.3)
Chloride: 109 mmol/L (ref 101–111)
Creatinine, Ser: 0.54 mg/dL (ref 0.44–1.00)
GFR calc Af Amer: >60 mL/min (ref >60)
GLUCOSE: 130 mg/dL — AB (ref 65–99)
POTASSIUM: 4.1 mmol/L (ref 3.5–5.1)
Sodium: 129 mmol/L — ABNORMAL LOW (ref 135–145)

## 2017-01-29 LAB — MAGNESIUM: Magnesium: 1.9 mg/dL (ref 1.7–2.4)

## 2017-01-29 MED ORDER — BOOST / RESOURCE BREEZE PO LIQD
1.0000 | Freq: Two times a day (BID) | ORAL | Status: DC
  Administered 2017-01-29 – 2017-02-03 (×6): 1 via ORAL

## 2017-01-29 MED ORDER — SODIUM CHLORIDE 1 G PO TABS
2.0000 g | ORAL_TABLET | Freq: Two times a day (BID) | ORAL | Status: DC
  Administered 2017-01-29 – 2017-01-30 (×3): 2 g via ORAL
  Filled 2017-01-29 (×5): qty 2

## 2017-01-29 NOTE — Progress Notes (Signed)
Orthopedic Tech Progress Note Patient Details:  Sharon MealingVictoria Kent 05/13/1960 [id number]  Ortho Devices Type of Ortho Device: CAM walker Ortho Device/Splint Location: RLE Ortho Device/Splint Interventions: Casandra DoffingOrdered   Denissa Cozart Craig 01/29/2017, 6:03 PM

## 2017-01-29 NOTE — [place] FL2 (Signed)
[place] MEDICAID FL2 LEVEL OF CARE SCREENING TOOL     IDENTIFICATION  Patient Name: Earnestine MealingVictoria Briones Birthdate: [birth year] Sex: female Admission Date (Current Location): 01/25/2017  [facility] [place]   Facility and Address:  [facility], [address], [place], [place] [address]      Provider Number: [id number]  Attending Physician Name and Address:  Tyson AliasVincent, Duncan Thomas, *  Relative Name and Phone Number:       Current Level of Care: Hospital Recommended Level of Care: Skilled Nursing Facility Prior Approval Number:    Date Approved/Denied:   PASRR Number: [id number]  Discharge Plan: SNF    Current Diagnoses: Patient Active Problem List   Diagnosis Date Noted  . Hyponatremia 01/29/2017  . Pancreatic insufficiency 01/27/2017  . Acute on chronic pancreatitis (HCC) 01/25/2017  . Rectal bleeding 12/28/2016  . Anemia 12/28/2016  . Pain of upper abdomen 12/07/2016  . Pancreatic steatorrhea   . Severe protein-calorie malnutrition (HCC)   . Unintentional weight loss of more than 10 pounds 11/13/2016  . Purpura, nonthrombopenic (HCC) 09/07/2016  . Chronic pain syndrome 09/07/2016  . Hemorrhoids 08/23/2016  . Hypertension 07/23/2016  . 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 Thoracic aortic atherosclerosis (HCC) 06/20/2016  . Dilation of pancreatic duct 06/16/2016  . Hepatic lesion 06/05/2016  . History of hypertension 06/05/2016  . Possible Cirrhosis 06/05/2016  . Hypokalemia   . Chronic pancreatitis (HCC) 04/21/2016  . Asthma 04/21/2016  . 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 S/P cholecystectomy 04/21/2016  . Tobacco abuse 04/21/2016  . Alcohol use disorder, mild, abuse 04/21/2016  . History of partial pancreatectomy 11/24/2015    Orientation RESPIRATION BLADDER Height & Weight     Self, Time, Situation, Place  Normal Continent Weight: 147 lb 12.8 oz (67 kg) Height:  5\' 6"  (167.6 cm)  BEHAVIORAL  SYMPTOMS/MOOD NEUROLOGICAL BOWEL NUTRITION STATUS   (None)  (None) Continent Diet (Heart healthy. Fluid restriction: 2000 mL.)  AMBULATORY STATUS COMMUNICATION OF NEEDS Skin   Limited Assist Verbally Bruising, Other (Comment) (Skin tear.)                       Personal Care Assistance Level of Assistance  Bathing, Feeding, Dressing Bathing Assistance: Limited assistance Feeding assistance: Independent Dressing Assistance: Limited assistance     Functional Limitations Info  Sight, Hearing, Speech Sight Info: Adequate Hearing Info: Adequate Speech Info: Adequate    SPECIAL CARE FACTORS FREQUENCY  PT (By licensed PT), OT (By licensed OT)     PT Frequency: 5 x week OT Frequency: 5 x week            Contractures Contractures Info: Not present    Additional Factors Info  Code Status, Allergies Code Status Info: Full Allergies Info: Morphine, Oxycodone-acetaminophen           Current Medications (01/29/2017):  This is the current hospital active medication list Current Facility-Administered Medications  Medication Dose Route Frequency Provider Last Rate Last Dose  . enoxaparin (LOVENOX) injection 40 mg  40 mg Subcutaneous Q24H Molt, Bethany, DO      . feeding supplement (BOOST / RESOURCE BREEZE) liquid 1 Container  1 Container Oral BID BM Tyson AliasVincent, Duncan Thomas, MD      . folic acid (FOLVITE) tablet 1 mg  1 mg Oral Daily Molt, Bethany, DO   1 mg at 01/28/17 0915  . hydrocortisone (ANUSOL-HC) 2.5 % rectal cream   Rectal QID Landis MartinsLacroce, Samantha  J, MD   1 application at 01/29/17 1100  . Influenza vac split quadrivalent PF (FLUARIX) injection 0.5 mL  0.5 mL Intramuscular Tomorrow-1000 Tyson Alias, MD      . lipase/protease/amylase (CREON) capsule 72,000 Units  72,000 Units Oral TID WC Rozann Lesches, MD   72,000 Units at 01/29/17 0848  . multivitamin with minerals tablet 1 tablet  1 tablet Oral Daily Molt, Bethany, DO   1 tablet at 01/28/17 0914  . ondansetron  (ZOFRAN) injection 4 mg  4 mg Intravenous Q6H PRN Nedrud, Jeanella Flattery, MD   4 mg at 01/26/17 1512  . pantoprazole (PROTONIX) EC tablet 40 mg  40 mg Oral Daily Molt, Bethany, DO   40 mg at 01/28/17 0915  . potassium chloride SA (K-DUR,KLOR-CON) CR tablet 40 mEq  40 mEq Oral BID Rozann Lesches, MD   20 mEq at 01/29/17 0848  . pramoxine (PROCTOFOAM) 1 % foam   Rectal TID PRN Geralyn Corwin Ratliff, DO      . sodium chloride tablet 2 g  2 g Oral BID WC Nedrud, Marybeth, MD      . thiamine (VITAMIN B-1) tablet 100 mg  100 mg Oral Daily Molt, Bethany, DO   100 mg at 01/29/17 0849  . witch hazel-glycerin (TUCKS) pad   Topical PRN Camelia Phenes, DO         Discharge Medications: Please see discharge summary for a list of discharge medications.  Relevant Imaging Results:  Relevant Lab Results:   Additional Information SS#: [id number]  Margarito Liner, LCSW

## 2017-01-29 NOTE — Progress Notes (Signed)
Subjective:  Patient was seen laying comfortably in her bed this AM in no acute distress. She states that she was able to tolerate a regular diet yesterday without nausea and vomiting. She continues to endorse hemorrhoid pain, but PRN medications are helping. Patient states that she continues to feel weak and, after discussion regarding PT recommendations, patient amenable to SNF placement once medically stable to leave hospital.  Objective:  Vital signs in last 24 hours: Vitals:   01/27/17 2150 01/28/17 0512 01/28/17 1300 01/28/17 2017  BP: (!) 95/57 100/60 (!) 101/56 99/61  Pulse: 99 99 92 95  Resp: 16 16 16 16   Temp: 98.1 F (36.7 C) 97.6 F (36.4 C) 98.5 F (36.9 C) 98 F (36.7 C)  TempSrc: Oral Oral Oral Oral  SpO2: 93% 92% 92% 98%  Weight:  147 lb 12.8 oz (67 kg)    Height:       Physical Exam  Constitutional:  Thin woman who appears older than stated age in no acute distress.  HENT:  Mouth/Throat: Oropharynx is clear and moist.  Cardiovascular: Normal rate, regular rhythm and intact distal pulses.  Exam reveals no friction rub.   No murmur heard. Pulmonary/Chest: Effort normal. No respiratory distress. She has no wheezes.  No crackles appreciated.  Abdominal: Soft. Bowel sounds are normal. She exhibits no distension. There is no tenderness (in epigastric/LUQ region, interval improvement in exam). There is no guarding.  Stable small to moderate (2-3 cm) nonpainful ventral hernia that is easily reducible on exam.  Musculoskeletal: She exhibits edema (Non pitting edema of LLE compared to RLE) and tenderness (with palpation of mid foot, medial malleolus, and lateral malleolus (L > R)). She exhibits no deformity.  No overt ecchymosis. No pain with passive dorsiflexion and plantarflexion of L or R ankle.   Skin: Skin is warm and dry. No rash noted. No erythema.   Assessment/Plan:  Principal Problem:   Acute on chronic pancreatitis (HCC) Active Problems:   Alcohol use  disorder, mild, abuse   Hypokalemia   Pancreatic insufficiency  Sharon Kent is a 56 yo with a PMH of chronic pancreatitis 2/2 alcohol use and pancreas divisum, GERD, asthma, and tobacco use who presented for acute worsening of chronic abdominal pain and nausea/vomiting. Her initial workup in the ED was consistent with acute pancreatitis. She was admitted to the internal medicine teaching service for management. The specific problems addressed during admission are as follows:  Acute on chronic pancreatitis: Patient's initial nausea, vomiting, and epigastric/LUQ pain has greatly improved and patient now tolerting full diet. Patient's acute exacerbation most likely a result of continued alcohol use in the setting of chronic pancreatitis. Patient has been counseled that complete alcohol cessation will assist in preventing further attacks. Will plan to recommend GI consult as outpatient upon discharge and nutrition consult today as patient overall deconditioned and weak from chronic illness. Patient had PT assessment yesterday recommending SNF and will consult social work today for assistance with placement.  -Continue heart healthy diet with fluid restriction -Nutrition consult now that patient tolerating regular diet -Continue home Creon, 72,000 Units TID with meals -Continue home pantoprazole 40 mg daily -PT suggests SNF placement, will place DME for recommended walking device on discharge -Social work consulted, assistance appreciated  Bilateral foot pain s/p unwitnessed fall on 10/21: Patient had an unwitnessed fall yesterday evening. Patient was reportedly walking unassisted when she tripped. She did not endorse any specific pain afterwards. This morning, however, the patient endorsed non-specific tenderness with bilateral  palpation of feet (L<R). Areas of pain included mid foot, medial and lateral malleoli (L>R), with some increased swelling of L foot. No pain with passive motion on PE this  AM.    -Will obtain L/R ankle x ray -Continue to work with PT while inpatient to regain strength  Hypokalemia and hyponatremia, chronic: AM labs today with Na = 129, K = 4.1, and Mg = 1.9. Will fluid restrict diet and add salt tabs to daily regimen -Continue K-Dur 40 mEq BID  -Start NaCl 2g, BID -BMP in AM  Hemorrhoids: Chronic and stable.  -2.5% hydrocortisone cream PRN -Tucks pads and pramoxine cream as needed for hemorrhoid relief added on 10/21  Hx of alcohol use: Patient has been counseled on multiple occassions during hospitalization that she needs to stop alcohol use to prevent flares of pancreatitis and prevent cirrhosis. Patient amenable to discontinuation of drinking and has plans to stay sober outside of hospital.   -CIWA while inpatient -Daily multivitamin  Diet:HH fluid restricted VTE prophylaxis: Lovenox Code:Full  Dispo: Anticipated discharge in approximately 1 day(s) pending SNF placement since patient is clinically stable and much improved from admission.  Rozann LeschesNedrud, Shernell Saldierna, MD 01/29/2017, 7:07 AM Pager: [phone]

## 2017-01-29 NOTE — Progress Notes (Signed)
Internal Medicine Attending:   I saw and examined the patient. I reviewed the resident's note and I agree with the resident's findings and plan as documented in the resident's note.  Symptoms are significantly improved from acute on chronic pancreatitis, currently hasn't required any opioid analgesia since 7AM on 10/21. Nutrition is just ok, hasn't had any breakfast today. She looks to have worsened depressed mood today, says she "doesn't feel well." Doesn't want to engage with us any further on this issue. She is deconditioned, weak, and had a fall last night while walking in her room. Not anticoagulated and otherwise stable. There is increased right ankle pain, probably due to her lower extremity edema, but will get plain films to ensure no fracture. Hyponatremia is euvolemic, likely due to low solute diet and free water replacement in hospital. Plan to free water restrict today and start salt tabs for the long term. Alcohol cessation would help with this problem as well. Dispo is to SNF for rehab services when available.

## 2017-01-29 NOTE — Clinical Social Work Note (Signed)
Clinical Social Work Assessment  Patient Details  Name: Sharon Kent MRN: [id number] Date of Birth: 07-21-1960  Date of referral:  01/29/17               Reason for consult:  Facility Placement, Discharge Planning                Permission sought to share information with:  Facility Art therapist granted to share information::  Yes, Verbal Permission Granted  Name::        Agency::  SNF's  Relationship::     Contact Information:     Housing/Transportation Living arrangements for the past 2 months:  Apartment Source of Information:  Patient, Medical Team Patient Interpreter Needed:  None Criminal Activity/Legal Involvement Pertinent to Current Situation/Hospitalization:  No - Comment as needed Significant Relationships:  Friend Lives with:  Self Do you feel safe going back to the place where you live?  No Need for family participation in patient care:  Yes (Comment)  Care giving concerns:  PT recommending SNF placement once medically stable for discharge.   Social Worker assessment / plan:  CSW met with patient. No supports at bedside. CSW introduced role and explained that PT recommendations would be discussed. CSW had to wake patient up to speak with her and initially she would only reply "no" to questions. CSW continued to ask about plan after discharge and patient stated she was not ready to go home. She is agreeable to SNF placement. CSW provided SNF list for review. No further concerns. CSW encouraged patient to contact CSW as needed. CSW will continue to follow patient for support and facilitate discharge to SNF once medically stable.  Employment status:  [employer] information:  Other (Comment Required) Psychologist, counselling) PT Recommendations:  [place] / Referral to community resources:  [place]  Patient/Family's Response to care:  Patient agreeable to SNF placement. Patient has a friend that is supportive and  involved in patient's care. Patient appreciated social work intervention.  Patient/Family's Understanding of and Emotional Response to Diagnosis, Current Treatment, and Prognosis: Patient has a good understanding of the reason for admission and her need for rehab prior to returning home alone. Patient appears pleased with hospital care.  Emotional Assessment Appearance:  Appears stated age Attitude/Demeanor/Rapport:  Lethargic Affect (typically observed):  Flat Orientation:  Oriented to Self, Oriented to Place, Oriented to  Time, Oriented to Situation Alcohol / Substance use:  Alcohol Use, Tobacco Use Psych involvement (Current and /or in the community):  No (Comment)  Discharge Needs  Concerns to be addressed:  Care Coordination Readmission within the last 30 days:  No Current discharge risk:  Dependent with Mobility, Lives alone Barriers to Discharge:  Ship broker, Continued Medical Work up   Candie Chroman, LCSW 01/29/2017, 1:51 PM

## 2017-01-29 NOTE — Progress Notes (Signed)
Paged Internal Medicine Teaching Service X2 about Pt's weakness, dyspnea upon exertion, and having to get up often, to go the bathroom. BP 97/53 O2 93%  HR 96

## 2017-01-29 NOTE — Clinical Social Work Placement (Signed)
   CLINICAL SOCIAL WORK PLACEMENT  NOTE  Date:  01/29/2017  Patient Details  Name: Earnestine MealingVictoria Toren MRN: [id number] Date of Birth: April 14, 1960  Clinical Social Work is seeking post-discharge placement for this patient at the Skilled  Nursing Facility level of care (*CSW will initial, date and re-position this form in  chart as items are completed):  Yes   Patient/family provided with [facility] Clinical Social Work Department's list of facilities offering this level of care within the geographic area requested by the patient (or if unable, by the patient's family).  Yes   Patient/family informed of their freedom to choose among providers that offer the needed level of care, that participate in Medicare, Medicaid or managed care program needed by the patient, have an available bed and are willing to accept the patient.  Yes   Patient/family informed of [facility]'s ownership interest in [facility] and [facility], as well as of the fact that they are under no obligation to receive care at these facilities.  PASRR submitted to EDS on 01/29/17     PASRR number received on 01/29/17     Existing PASRR number confirmed on       FL2 transmitted to all facilities in geographic area requested by pt/family on 01/29/17     FL2 transmitted to all facilities within larger geographic area on       Patient informed that his/her managed care company has contracts with or will negotiate with certain facilities, including the following:            Patient/family informed of bed offers received.  Patient chooses bed at       Physician recommends and patient chooses bed at      Patient to be transferred to   on  .  Patient to be transferred to facility by       Patient family notified on   of transfer.  Name of family member notified:        PHYSICIAN Please sign FL2     Additional Comment:    _______________________________________________ Margarito LinerSarah C Jovanne Riggenbach, LCSW 01/29/2017,  1:54 PM

## 2017-01-29 NOTE — Progress Notes (Addendum)
NIGHT TEAM INTERVAL PROGRESS NOTE  Paged by nurse at ~9pm regarding concern for dyspnea on exertion and weakness. Went to bedside to evaluate patient. Patient was sleeping at the time of my exam. Vital signs stable - BP 97/53, HR 96, O2 93% on RA, afebrile. Patient arousable by voice. She denied any new complaints. Informed patient to let us know if she has any new concerns.

## 2017-01-29 NOTE — Discharge Summary (Signed)
Name: Sharon MealingVictoria [facility] MRN: [id number] DOB: 09-14-1960 56 y.o. PCP: Sharon RungHoffman, Erik C, DO  Date of Admission: 01/25/2017  4:30 PM Date of Discharge: 02/03/2017 Attending Physician: Tyson AliasVincent, Duncan Thomas, *  Discharge Diagnosis:  Principal Problem:   Acute on chronic pancreatitis [facility](HCC) Active Problems:   Alcohol use disorder, mild, abuse   Hypokalemia   Pancreatic insufficiency   Hyponatremia   Rectal pain   Chronic anterior anal fissure   External thrombosed hemorrhoids  Discharge Medications: Allergies as of 02/03/2017      Reactions   Morphine Nausea Only   Oxycodone-acetaminophen Other (See Comments)   Makes her "loopy."      Medication List    TAKE these medications   Albuterol Sulfate 108 (90 Base) MCG/ACT Aepb Inhale 2 puffs into the lungs as needed. What changed:  when to take this  reasons to take this   azithromycin 500 MG tablet Commonly known as:  ZITHROMAX Take 1 tablet (500 mg total) by mouth daily.   hydrocortisone 2.5 % rectal cream Commonly known as:  ANUSOL-HC Place rectally 4 (four) times daily.   multivitamin with minerals tablet Take 1 tablet by mouth daily.   NONFORMULARY OR COMPOUNDED ITEM Apply 1 application topically 3 (three) times daily.   Pancrelipase (Lip-Prot-Amyl) 24000-76000 units Cpep Take 3 capsules (72,000 Units total) by mouth 3 (three) times daily with meals. What changed:  how much to take   pantoprazole 40 MG tablet Commonly known as:  PROTONIX Take 1 tablet (40 mg total) by mouth daily.   phenylephrine 0.25 % suppository Commonly known as:  (USE for PREPARATION-H) Place 1 suppository rectally 2 (two) times daily.   potassium chloride SA 20 MEQ tablet Commonly known as:  K-DUR,KLOR-CON Take 2 tablets (40 mEq total) by mouth 2 (two) times daily. What changed:  when to take this   sodium bicarbonate 650 MG tablet Take 1 tablet (650 mg total) by mouth 3 (three) times daily.   traMADol 50 MG tablet Commonly  known as:  ULTRAM Take 1 tablet (50 mg total) by mouth daily. What changed:  when to take this  reasons to take this   witch hazel-glycerin pad Commonly known as:  TUCKS Apply 1 application topically as needed for itching.            Durable Medical Equipment        Start     Ordered   02/03/17 1136  For home use only DME Walker rolling  Sharon [facility](Walkers)  Once    Question:  Patient needs a walker to treat with the following condition  Answer:  Physical deconditioning   02/03/17 1137     Disposition and follow-up:   Ms.Janalee Ninetta LightsHatcher [facility].  At the [facility] follow up visit please address:  1.  Patient was admitted with acute on chronic pancreatitis. Her nausea/vomiting and epigastric/LUQ abdominal pain was well controlled during hospitalization and at time of discharge the patient was tolerating normal diet without difficulty. Patient's symptoms most likely related to chronic alcohol use and patient was advised to avoid further alcohol intake on discharge. Please assess symptoms of abdominal pain at discharge. Please ensure that patient is taking Creon, as she was very weak and malnourished upon presentation.   Patient also had numerous electrolyte abnormalities, including hypomagnesia, hypokalemia, and hyponatremia that were corrected during hospitalization. She was discharged with K-Dur 40 mEq BID  (with 2L fluid restricted diet) and sodium bicarbonate. Please see how  she is doing with these medications.  2.  Labs / imaging needed at time of follow-up: BMP to assess electrolyte levels  3.  Pending labs/ test needing follow-up: None  Follow-up Appointments:  Contact information for follow-up providers    Sharon Rung, DO. Go to.   Specialty:  Internal Medicine Why:  November 1, at 8:45am with your PCP. Contact information: [address]  [place] [address] [phone]        Sharon Hitch,  MD. Schedule an appointment as soon as possible for a visit in 2 week(s).   Specialty:  Orthopedic Surgery Contact information: [address] [place] [address] [phone]            Contact information for after-discharge care    Destination    [facility] Follow up.   Specialty:  Skilled Nursing Facility Contact information: [address] [place] [address] [phone]                  [facility] Course by problem list: Principal Problem:   Acute on chronic pancreatitis [facility]) Active Problems:   Alcohol use disorder, mild, abuse   Hypokalemia   Pancreatic insufficiency   Hyponatremia   Rectal pain   Chronic anterior anal fissure   External thrombosed hemorrhoids   Sharon [facility] is a 56 yo with a PMH of chronic pancreatitis 2/2 alcohol use and pancreas divisum, GERD, asthma, and tobacco use who is presenting was acute worsening of chronic abdominal pain and increased nausea/vomiting which occurred during the two days leading up to admission. She was admitted to the internal medicine teaching service for management. The specific problems addressed during admission are as follows:  Acute on chronic pancreatitis: On admission the patient reported abdominal pain and nausea that felt similar to past episodes of acute pancreatitis, but less severe in nature. Given patient's history of chronic pancreatitis, story of LUQ pain that radiates to her back, physical exam with epigastric and LUQ pain, and imaging findings showing peripancreatic edema, the patient's current episode of nausea/vomiting and abdominal pain was most consistent with acute on chronic pancreatitis. The patient was treated with bowel rest, IV fluids, and IV dilaudid 1 mg q4 hours. Over the course of her hospitalization, the patient's abdominal pain slowly improved and her diet was advanced as tolerated. On the day of discharge the patient was tolerating a normal  diet without nausea/vomiting and her pain was well controlled with PRN use of oral Tylenol. The patient was counseled about the importance of alcohol cessation to prevent flares of chronic pancreatitis. She was also instructed about the importance of taking daily Creon with meals to prevent malnutrition.   Acute on chronic anemia secondary to hemorrhoidal bleeding Patient required 1 unit of PRBC during admission for hemoglobin drop to 7.2. Patient reported fatigue and shortness of breath. Surgery was consulted due to the external hemorrhoid bleeding throughout admission. Colonoscopy was recommended. Patient had colonoscopy showing anal fissure and external hemorrhoids. Recommendations included treating with diltiazem 2%/lidocaine 5% ointment to ano-rectum TID x6-8 weeks and follow up with GI in 4-6 weeks  Hx of alcohol use: Patient has been counseled on multiple occassions during hospitalization that she needs to stop alcohol use to prevent flares of pancreatitis and prevent cirrhosis. Patient amenable to discontinuation of drinking and has plans to stay sober outside of [facility]. Her ethanol level was <10 on admission. She was maintained on CIWA precautions while inpatient. There was no concern for  alcohol withdrawal during her hospitalization.   Hypokalemia and hyponatremia, chronic with metabolic acidosis: Patient has chronic electrolyte abnormalities per chart review. Patient's electrolyte abnormalities were corrected with IV replacement. Patient was counseled about fluid restriction to help with chronic hyponatremia while outpatient.  Non-anion gap acidosis is being treated with sodium bicarbonate 650mg  TID. Will need repeat BMET in one week to further assess if she needs to continue sodium bicarbonate.  Bilateral foot pain s/p unwitnessed fall on 10/21: Patient had an unwitnessed fall during admission. Patient was reportedly walking unassisted when she tripped. The patient had non-specific tenderness  with bilateral palpation of feet. Bilateral ankle X rays were obtained and showed ligamentous disruption.  She was given cam-walkers bilaterally.  Discharge Vitals:   BP 103/69 (BP Location: Left Arm)   Pulse 75   Temp (!) 97.3 F (36.3 C) (Oral)   Resp 20   Ht 5\' 6"  (1.676 m)   Wt 123 lb 14.4 oz (56.2 kg)   SpO2 93%   BMI 20.00 kg/m   Pertinent Labs, Studies, and Procedures:     Discharge Instructions: Discharge Instructions    Diet - low sodium heart healthy    Complete by:  As directed    Discharge instructions    Complete by:  As directed    Please follow up with your primary care doctor and GI doctor after discharge   Increase activity slowly    Complete by:  As directed       Signed: Camelia Phenes, DO 02/03/2017, 11:38 AM   Pager: [phone]

## 2017-01-29 NOTE — Progress Notes (Signed)
Occupational Therapy Evaluation Patient Details Name: Sharon [facility] MRN: [id number] DOB: 21-Feb-1961 Today's Date: 01/29/2017    History of Present Illness Sharon [facility] is a 56 yo with a PMH of chronic pancreatitis 2/2 alcohol use and pancreas divisum, GERD, asthma, and tobacco use who is presenting was acute worsening of chronic abdominal pain and nausea/vomiting.   Clinical Impression   PTA, pt lived at home alone and was independent with ADL and mobility. Pt currently requires min A for ADL and mobility @ RW level and will benefit form rehab at SNF to facilitate safe DC home. Pt with very flat affect and apparent depressed mood. Declined sitting in chair or attempting to eat meals.Recommend psych consult. Will follow acutely to facilitae safe DC to next venue of care and address established goals.     Follow Up Recommendations  SNF;Supervision/Assistance - 24 hour    Equipment Recommendations  3 in 1 bedside commode    Recommendations for Other Services       Precautions / Restrictions Precautions Precautions: Fall Restrictions Weight Bearing Restrictions: No      Mobility Bed Mobility Overal bed mobility: Needs Assistance Bed Mobility: Supine to Sit;Sit to Supine     Supine to sit: Supervision Sit to supine: Supervision      Transfers Overall transfer level: Needs assistance Equipment used: None;Rolling walker (2 wheeled) Transfers: Sit to/from Stand Sit to Stand: Min guard         General transfer comment: for balance, initially refusing walker, but then trying to hold IV pole, so used walker    Balance Overall balance assessment: Needs assistance;History of Falls Sitting-balance support: No upper extremity supported Sitting balance-Leahy Scale: Good     Standing balance support: No upper extremity supported Standing balance-Leahy Scale: Fair Standing balance comment: static balance okay, then with ambulation needs UE support                            ADL either performed or assessed with clinical judgement   ADL Overall ADL's : Needs assistance/impaired   Eating/Feeding Details (indicate cue type and reason): pt able to self feed but poor PO intake Grooming: Set up;Supervision/safety;Sitting   Upper Body Bathing: Supervision/ safety;Set up;Sitting   Lower Body Bathing: Minimal assistance;Sit to/from stand   Upper Body Dressing : Supervision/safety;Set up;Sitting   Lower Body Dressing: Moderate assistance;Sit to/from stand   Toilet Transfer: Minimal assistance;RW   Toileting- Clothing Manipulation and Hygiene: Supervision/safety;Sit to/from stand       Functional mobility during ADLs: Minimal assistance;Rolling walker;Cueing for safety General ADL Comments: poor orientation insdie of RW; multiple vc for improved position in RW without return demonstration; dissheveled appearance without concern; offered to assist pt with bathing/grooming as pt appeared soiled, however, pt was disinterested in engaging in activity     Vision         Perception     Praxis      Pertinent Vitals/Pain Pain Assessment: Faces Faces Pain Scale: Hurts little more Pain Location: general discomfort Pain Descriptors / Indicators: Guarding;Grimacing Pain Intervention(s): Limited activity within patient's tolerance     Hand Dominance Right   Extremity/Trunk Assessment Upper Extremity Assessment RUE Deficits / Details: AROM limited shoulder elevation to about 100 with pain on overpressure, strength grossly 3+/5 LUE Deficits / Details: AROM limited shoulder elevation to about 100 with pain on overpressure, strength grossly 3+/5   Lower Extremity Assessment Lower Extremity Assessment: Defer to PT evaluation   Cervical /  Trunk Assessment Cervical / Trunk Assessment: Kyphotic   Communication Communication Communication: No difficulties   Cognition Arousal/Alertness: Lethargic Behavior During Therapy: Flat affect Overall  Cognitive Status: No family/caregiver present to determine baseline cognitive functioning (most likely baseline)                                     General Comments       Exercises     Shoulder Instructions      Home Living Family/patient expects to be discharged to:: Private residence Living Arrangements: Alone   Type of Home: Apartment Home Access: Stairs to enter Entergy CorporationEntrance Stairs-Number of Steps: 14 Entrance Stairs-Rails: Right Home Layout: One level     Bathroom Shower/Tub: Chief Strategy OfficerTub/shower unit   Bathroom Toilet: Standard Bathroom Accessibility: No   Home Equipment: None   Additional Comments: Sharon [facility] (boss) can help intermittently      Prior Functioning/Environment Level of Independence: Independent        Comments: been having lot of difficulty getting up the stairs, does not feel she can manage to get up them safely; has had frequent falls        OT Problem List: Decreased strength;Decreased activity tolerance;Impaired balance (sitting and/or standing);Decreased cognition;Decreased safety awareness;Decreased knowledge of use of DME or AE;Pain      OT Treatment/Interventions: Self-care/ADL training;Therapeutic exercise;Energy conservation;DME and/or AE instruction;Therapeutic activities;Patient/family education;Balance training    OT Goals(Current goals can be found in the care plan section) Acute Rehab OT Goals Patient Stated Goal: to stay in the bed OT Goal Formulation: With patient Time For Goal Achievement: 02/12/17 Potential to Achieve Goals: Fair ADL Goals Pt Will Perform Lower Body Bathing: with modified independence;sit to/from stand Pt Will Perform Lower Body Dressing: with modified independence;sit to/from stand Pt Will Transfer to Toilet: with modified independence;ambulating Pt Will Perform Toileting - Clothing Manipulation and hygiene: with modified independence;sit to/from stand Additional ADL Goal #1: Pt will engage in 1  meaningful activity with minimum encouragement    OT Frequency: Min 2X/week   Barriers to D/C:            Co-evaluation              AM-PAC PT "6 Clicks" Daily Activity     Outcome Measure Help from another person eating meals?: None Help from another person taking care of personal grooming?: None Help from another person toileting, which includes using toliet, bedpan, or urinal?: A Little Help from another person bathing (including washing, rinsing, drying)?: A Little Help from another person to put on and taking off regular upper body clothing?: None Help from another person to put on and taking off regular lower body clothing?: A Little 6 Click Score: 21   End of Session Equipment Utilized During Treatment: Rolling walker Nurse Communication: Mobility status  Activity Tolerance: Patient limited by lethargy Patient left: in bed;with nursing/sitter in room (sitting EOB with nsg)  OT Visit Diagnosis: Unsteadiness on feet (R26.81);Repeated falls (R29.6);Muscle weakness (generalized) (M62.81);Pain Pain - part of body:  (geneal discomfort)                Time: 1610-96041403-1418 OT Time Calculation (min): 15 min Charges:  OT General Charges $OT Visit: 1 Visit OT Evaluation $OT Eval Moderate Complexity: 1 Mod G-Codes:     Kiah Vanalstine, OT/L  [phone] 01/29/2017  Sharon [facility],HILLARY 01/29/2017, 4:14 PM

## 2017-01-29 NOTE — Progress Notes (Signed)
Initial Nutrition Assessment  DOCUMENTATION CODES:   Not applicable  INTERVENTION:  Boost Breeze po BID, each supplement provides 250 kcal and 9 grams of protein  NUTRITION DIAGNOSIS:   Increased nutrient needs related to chronic illness (pancreatitis) as evidenced by estimated needs.  GOAL:   Patient will meet greater than or equal to 90% of their needs  MONITOR:   PO intake, Supplement acceptance, Labs, I & O's, Weight trends  REASON FOR ASSESSMENT:   Malnutrition Screening Tool, Consult Assessment of nutrition requirement/status  ASSESSMENT:   Pt with PMH of GERD, chronic pancreatis, and chronic alcohol use presents with abdominal pain, N/V and oral intolerance found acute on chronic pancreatitis   Pt did not want to answer RD questions at time of visit.  Pt reports a fair appetite and a UBW of 125 lbs, pt did not elaborate further on either topic.  Per chart, weights have trended upwards, however weight changes may be r/t fluid status.  Pt's untouched breakfast tray at bedside at time of visit. Per meal completion records, pt consumed 100% of all meals yesterday.  Per previous admissions, pt enjoys Boost Breeze supplement  Nutrition focused physical exam completed. Findings include no fat or muscle depletions and severe edema  Labs reviewed; Na 129 Medications reviewed; folic acid, Creon, multivitamin, Protonix, potassium chloride, sodium chloride, thiamin   Diet Order:  Diet Heart Room service appropriate? Yes; Fluid consistency: Thin; Fluid restriction: 2000 mL Fluid  Skin:  Reviewed, no issues  Last BM:  01/29/17  Height:   Ht Readings from Last 1 Encounters:  01/25/17 5\' 6"  (1.676 m)    Weight:   Wt Readings from Last 1 Encounters:  01/28/17 147 lb 12.8 oz (67 kg)    Ideal Body Weight:  59 kg  BMI:  Body mass index is 23.86 kg/m.  Estimated Nutritional Needs:   Kcal:  2000-2200  Protein:  100-110  Fluid:  <2 L/d  EDUCATION NEEDS:    Education needs no appropriate at this time  Fransisca Kaufmannllison Ioannides, MS, RDN, LDN 01/29/2017 1:46 PM

## 2017-01-29 NOTE — Progress Notes (Signed)
Pt refused blood draw for uric acid. MD made aware. Will continue to monitor.

## 2017-01-30 DIAGNOSIS — J810 Acute pulmonary edema: Secondary | ICD-10-CM

## 2017-01-30 DIAGNOSIS — S93492A Sprain of other ligament of left ankle, initial encounter: Secondary | ICD-10-CM

## 2017-01-30 DIAGNOSIS — S93491A Sprain of other ligament of right ankle, initial encounter: Secondary | ICD-10-CM

## 2017-01-30 DIAGNOSIS — W19XXXA Unspecified fall, initial encounter: Secondary | ICD-10-CM

## 2017-01-30 DIAGNOSIS — R0902 Hypoxemia: Secondary | ICD-10-CM

## 2017-01-30 DIAGNOSIS — R188 Other ascites: Secondary | ICD-10-CM

## 2017-01-30 LAB — CULTURE, BLOOD (ROUTINE X 2)
CULTURE: NO GROWTH
CULTURE: NO GROWTH
SPECIAL REQUESTS: ADEQUATE
Special Requests: ADEQUATE

## 2017-01-30 LAB — BASIC METABOLIC PANEL
ANION GAP: 7 (ref 5–15)
BUN: <5 mg/dL — ABNORMAL LOW (ref 6–20)
CO2: 15 mmol/L — ABNORMAL LOW (ref 22–32)
Calcium: 8.6 mg/dL — ABNORMAL LOW (ref 8.9–10.3)
Chloride: 115 mmol/L — ABNORMAL HIGH (ref 101–111)
Creatinine, Ser: 0.6 mg/dL (ref 0.44–1.00)
GFR calc Af Amer: >60 mL/min (ref >60)
Glucose, Bld: 110 mg/dL — ABNORMAL HIGH (ref 65–99)
POTASSIUM: 3.9 mmol/L (ref 3.5–5.1)
SODIUM: 137 mmol/L (ref 135–145)

## 2017-01-30 LAB — GLUCOSE, CAPILLARY: Glucose-Capillary: 99 mg/dL (ref 65–99)

## 2017-01-30 MED ORDER — IBUPROFEN 200 MG PO TABS
600.0000 mg | ORAL_TABLET | Freq: Four times a day (QID) | ORAL | Status: DC | PRN
  Administered 2017-01-30 – 2017-02-02 (×3): 600 mg via ORAL
  Filled 2017-01-30 (×3): qty 3

## 2017-01-30 MED ORDER — FUROSEMIDE 10 MG/ML IJ SOLN
20.0000 mg | Freq: Once | INTRAMUSCULAR | Status: AC
  Administered 2017-01-30: 20 mg via INTRAVENOUS
  Filled 2017-01-30: qty 2

## 2017-01-30 MED ORDER — ALBUTEROL SULFATE (2.5 MG/3ML) 0.083% IN NEBU
INHALATION_SOLUTION | RESPIRATORY_TRACT | Status: AC
  Filled 2017-01-30: qty 3

## 2017-01-30 MED ORDER — TRAMADOL HCL 50 MG PO TABS
50.0000 mg | ORAL_TABLET | Freq: Every day | ORAL | Status: DC | PRN
  Administered 2017-01-30 – 2017-02-02 (×7): 50 mg via ORAL
  Filled 2017-01-30 (×6): qty 1

## 2017-01-30 MED ORDER — TRAMADOL HCL 50 MG PO TABS
50.0000 mg | ORAL_TABLET | Freq: Every day | ORAL | Status: DC | PRN
Start: 2017-01-30 — End: 2017-01-30
  Filled 2017-01-30: qty 1

## 2017-01-30 MED ORDER — IPRATROPIUM-ALBUTEROL 0.5-2.5 (3) MG/3ML IN SOLN
3.0000 mL | Freq: Once | RESPIRATORY_TRACT | Status: AC
  Administered 2017-01-30: 3 mL via RESPIRATORY_TRACT

## 2017-01-30 NOTE — Progress Notes (Signed)
Physical Therapy Treatment Patient Details Name: Sharon [facility] MRN: [id number] DOB: [birth year] Today's Date: 01/30/2017    History of Present Illness Emmamarie Kluender is a 56 yo with a PMH of chronic pancreatitis 2/2 alcohol use and pancreas divisum, GERD, asthma, and tobacco use who is presenting was acute worsening of chronic abdominal pain and nausea/vomiting.    PT Comments    Patient needing increased encouragement and assist for participation this session.  Able to stand with close S initially without camboots, but reported less pain when using boots.  However, needs min/mod A when using them and has increased difficulty moving her legs for ambulation.    Follow Up Recommendations  SNF     Equipment Recommendations  Rolling walker with 5" wheels    Recommendations for Other Services       Precautions / Restrictions Precautions Precautions: Fall Required Braces or Orthoses: Other Brace/Splint Other Brace/Splint: bilat cam boots Restrictions Weight Bearing Restrictions: No    Mobility  Bed Mobility Overal bed mobility: Needs Assistance Bed Mobility: Sit to Supine       Sit to supine: Mod assist   General bed mobility comments: support for each leg as she put it in the bed  Transfers Overall transfer level: Needs assistance Equipment used: Rolling walker (2 wheeled) Transfers: Sit to/from Stand Sit to Stand: Min assist;Min guard         General transfer comment: steadying assist initially as pt coming up to stand without cam boots on, then with cam boots needed assist to stand  Ambulation/Gait Ambulation/Gait assistance: Min assist;Mod assist Ambulation Distance (Feet): 8 Feet (&3') Assistive device: Rolling walker (2 wheeled) Gait Pattern/deviations: Step-to pattern;Decreased stride length;Trunk flexed;Wide base of support     General Gait Details: initially without cam boots with walker shuffling pattern few steps forward and back limited by O2  tubing length; then with mod A for safety with cam boots on taking time to pick up each leg and assist for balance/safety on 2L O2 throughout SpO2 89-81%   Stairs            Wheelchair Mobility    Modified Rankin (Stroke Patients Only)       Balance Overall balance assessment: Needs assistance   Sitting balance-Leahy Scale: Fair     Standing balance support: Bilateral upper extremity supported Standing balance-Leahy Scale: Poor Standing balance comment: UE support needed for balance                            Cognition Arousal/Alertness: Awake/alert Behavior During Therapy: Flat affect Overall Cognitive Status: No family/caregiver present to determine baseline cognitive functioning                                        Exercises      General Comments General comments (skin integrity, edema, etc.): noted ecchymosis on bilateral elbows, yellowing of skin on face      Pertinent Vitals/Pain Pain Score: 9  Pain Location: both legs Pain Descriptors / Indicators: Guarding;Grimacing Pain Intervention(s): Monitored during session;Repositioned    Home Living                      Prior Function            PT Goals (current goals can now be found in the care plan section) Progress towards PT  goals: Not progressing toward goals - comment (declining)    Frequency    Min 3X/week      PT Plan Current plan remains appropriate    Co-evaluation              AM-PAC PT "6 Clicks" Daily Activity  Outcome Measure  Difficulty turning over in bed (including adjusting bedclothes, sheets and blankets)?: None Difficulty moving from lying on back to sitting on the side of the bed? : Unable Difficulty sitting down on and standing up from a chair with arms (e.g., wheelchair, bedside commode, etc,.)?: Unable Help needed moving to and from a bed to chair (including a wheelchair)?: A Lot Help needed walking in hospital room?: A  Lot Help needed climbing 3-5 steps with a railing? : Total 6 Click Score: 11    End of Session Equipment Utilized During Treatment: Gait belt;Other (comment) (both camboots) Activity Tolerance: Patient limited by fatigue Patient left: in bed;with call bell/phone within reach;with nursing/sitter in room   PT Visit Diagnosis: Unsteadiness on feet (R26.81);History of falling (Z91.81);Other symptoms and signs involving the nervous system (R29.898)     Time: 7829-56211201-1224 PT Time Calculation (min) (ACUTE ONLY): 23 min  Charges:  $Gait Training: 8-22 mins $Therapeutic Activity: 8-22 mins                    G CodesSheran Lawless:       Cyndi Wynn, [place] CarolinaPT [phone] 01/30/2017    Elray Mcgregorynthia Wynn 01/30/2017, 1:19 PM

## 2017-01-30 NOTE — Progress Notes (Addendum)
Internal Medicine Attending:   I saw and examined the patient. I reviewed the resident's note and I agree with the resident's findings and plan as documented in the resident's note.  [age] year old woman admitted with acute on chronic pancreatitis due to alcohol use, which is essentially now resolved after supportive care. Hospital stay has become complicated with worsening shortness of breath, mild hypoxia at rest,and evidence of pulmonary edema on exam. On ultrasound this morning showed numerous B-lines throughout the lung fields. She small accumulation of ascites around her liver, no pleural effusion. Likely this volume is come from IV fluid and deconditioning. I agree with plan to start IV furosemide 20mg  daily, goal diurese 1L daily. Please start checking daily weights, and monitor K and Mag. May need to redose furosemide later today depending on response. She continues to appear very depressed, not engaging in our care, answers questions minimally, not getting out of bed. We encouraged her to increase out of bed activity or her deconditioning will only worsen.

## 2017-01-30 NOTE — Social Work (Signed)
CSW met with patient at bedside and patient was unable to select SNF as she appeared confused. CSW will continue to follow up for SNF.  Elissa Hefty, LCSW Clinical Social Worker [phone]

## 2017-01-30 NOTE — Progress Notes (Signed)
Orthopedic Tech Progress Note Patient Details:  Sharon [facility] [birth year] [id number]  Ortho Devices Type of Ortho Device: CAM walker Ortho Device/Splint Location: lle Ortho Device/Splint Interventions: Application   Sharon [facility] 01/30/2017, 12:01 PM

## 2017-01-30 NOTE — Progress Notes (Addendum)
Subjective:  Patient was seen standing at by her bed eating breakfast this morning in no acute distress. Patient states that she has had difficulty breathing overnight and that she continues to feel pain in her bilateral ankles while standing/walking around her room. She states that her ankle pain even occurs at rest while layin in her bed. Patient states that she has been able to eat a little bit yesterday without any nausea and vomiting. This morning she endorses some pain in her abdomen that is consistent with her chronic abdominal pain.  Objective:  Vital signs in last 24 hours: Vitals:   01/29/17 1530 01/29/17 2049 01/30/17 0027 01/30/17 0500  BP: 100/62 (!) 97/53 (!) 108/56 99/76  Pulse: 89 96 (!) 58 (!) 102  Resp: 18 18  15   Temp: 98 F (36.7 C) 98 F (36.7 C)  (!) 97.5 F (36.4 C)  TempSrc: Oral Axillary  Axillary  SpO2: 98% 93% 91% 90%  Weight:    149 lb 14.6 oz (68 kg)  Height:       Physical Exam  Constitutional: No distress.  Thin, chronically sick appearing woman standing at bedside in no acute distress.  Eyes: Pupils are equal, round, and reactive to light. Conjunctivae and EOM are normal.  Cardiovascular: Normal rate, regular rhythm and intact distal pulses.  Exam reveals no friction rub.   No murmur heard. Pulmonary/Chest:  No nasal flaring or accessory muscle use. Poor effort with exam. Bibasilar crackles noted. No wheezing or crackles in upper lung fields.  Abdominal: Soft. Bowel sounds are normal. She exhibits no distension. There is tenderness (Mild tenderness in epigastric and LUQ region, stable from previous exams). There is no guarding.  Musculoskeletal: She exhibits edema (1-2+ pitting edema in ankles bilaterally, with trace pitting edema to knees bilaterally) and tenderness (Tenderness with plapation of bilateral feet, ankles, and distal lower extremities).  Pain with passive movement of ankles bilaterally, ROM testing limited 2/2 pain.   Neurological:    Face strength and sensation intact bilaterally. Tongue midline. Shoulder shrug strength within normal limits. 5/5 grip strength bilaterally. Gross sensation to light touch of upper and lower extremities intact bilaterally. No ataxia or gait widening.  Skin: Skin is warm and dry. No rash noted. No erythema.  Multiple areas of ecchymosis present on upper extremities bilaterally.   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 Her initial workup in the ED was consistent with acute pancreatitis. She was admitted to the internal medicine teaching service for management. The specific problems addressed during admission are as follows:  Acute on chronic pancreatitis: Patient's initial nausea, vomiting, and epigastric/LUQ pain has greatly improved and patient now tolerting regular, solid food intake. Patient appears volume overloaded on exam with crackles in lungs, B lines on POC beside ultrasound, and pitting edema in bilateral lower extremities to the knees. This is most likely from increased IVF administration during treatment of acute pancreatitis. Will give IV lasix and monitor urine output. Encouraged patient to mobilize today to assist with SOB.  -Continue Creon, 72,000 units TID with meals -Continue home Tramadol 50 mg daily for pain -IV lasix, 20 mg today. Will repeat dose if  no improvement in symptoms this PM.  -Strict I&O while on lasix -Mobilize to chair and out of bed as tolerated.  ADDENDUM: Patient about 900 mL urine output since first dose of lasix this AM. Patient continues to be hypoxic on 2L, saturating 88% while ambulating in room with  walker. Patient still has crackles on exam and endorses dyspnea. Will provide additional IV lasix 20 mg.   Bilateral ankle sprains (grade 3 R and grade 2-3 on L) s/p unwitnessed fall on 10/21: Patient had an unwitnessed fall two days ago. Imaging of bilateral ankles revealed abnormal widening of lateral aspect of ankle joint consistent with bilateral lateral ligamentous injuries. Will follow recommendations of bilateral CAM, WBAT with assistive walking device, and rest/ice/elevation of lower extremities. -Orthopedic surgery consulting, appreciate recommendations, will follow up as outpatient in 1-2 weeks -Ibuprofen 600 mg q6 hours PRN for pain control -Elevate feet while in bed and in chair -PT consulted, appreciate recommendations.   Hypokalemia and hyponatremia, chronic: AM labs today with Na of 137, K of 3.9. Will continue to monitor tomorrow with BMP after IV diuresis today. -Continue K-Dur 40 mEq BID  -Continue NaCl 2g, BID -BMP in AM  Hemorrhoids: Chronic and stable.  -2.5% hydrocortisone cream PRN -Tucks pads and pramoxine cream as needed for hemorrhoid relief added on 10/21  Hx of alcohol use:  -CIWA while inpatient -Daily multivitamin  Diet:HH fluid restricted, 2L VTE prophylaxis: Lovenox Code:Full  Dispo: Anticipated discharge in approximately 1-2 day(s).   Rozann LeschesNedrud, Jaydeen Darley, MD 01/30/2017, 8:44 AM Pager: [phone]

## 2017-01-30 NOTE — Consult Note (Signed)
Reason for Consult:Ankle pain Referring Physician: D Sevyn [name] is an 56 y.o. female.  HPI: Sharon Kent fell 2d ago while an inpatient here for chronic pancreatitis. She had been c/o ankle pain ever since. X-rays showed likely ligamentous disruption and orthopedic surgery was consulted. She seems somewhat encephalopathic and does not contribute much to history or exam.  Past Medical History:  Diagnosis Date  . Asthma   . Family history of adverse reaction to anesthesia       . GERD (gastroesophageal reflux disease)   . H/O chronic pancreatitis   . H/O ETOH abuse   . Headache    "monthly" (06/15/2016)  . History of blood transfusion 2000s   "when they explored my abdomen"  . Hx of pulmonary embolus   . Pancreatic abscess   . Pneumonia    "twice at least" (06/15/2016)    Past Surgical History:  Procedure Laterality Date  . EXPLORATORY LAPAROTOMY  2000s   "took out all my organs and washed the dead tissue"  . IVC FILTER PLACEMENT ([facility] HX)  12/2010?  Marland Kitchen LAPAROSCOPIC CHOLECYSTECTOMY  2000s    Family History  Problem Relation Age of Onset  . Adopted: Yes  . Family history unknown: Yes    Social History:  reports that she has been smoking Cigarettes.  She has a 15.50 pack-year smoking history. She has never used smokeless tobacco. She reports that she drinks alcohol. She reports that she does not use drugs.  Allergies:  Allergies  Allergen Reactions  . Morphine Nausea Only  . Oxycodone-Acetaminophen Other (See Comments)    Makes her "loopy."    Medications: I have reviewed the patient's current medications.  Results for orders placed or performed during the hospital encounter of 01/25/17 (from the past 48 hour(s))  Basic metabolic panel     Status: Abnormal   Collection Time: 01/29/17  5:47 AM  Result Value Ref Range   Sodium 129 (L) 135 - 145 mmol/L   Potassium 4.1 3.5 - 5.1 mmol/L   Chloride 109 101 - 111 mmol/L   CO2 13 (L) 22 - 32 mmol/L   Glucose, Bld  130 (H) 65 - 99 mg/dL   BUN <5 (L) 6 - 20 mg/dL   Creatinine, Ser 0.54 0.44 - 1.00 mg/dL   Calcium 7.8 (L) 8.9 - 10.3 mg/dL   GFR calc non Af Amer >60 >60 mL/min   GFR calc Af Amer >60 >60 mL/min    Comment: (NOTE) The eGFR has been calculated using the CKD EPI equation. This calculation has not been validated in all clinical situations. eGFR's persistently <60 mL/min signify possible Chronic Kidney Disease.    Anion gap 7 5 - 15  Magnesium     Status: None   Collection Time: 01/29/17  5:47 AM  Result Value Ref Range   Magnesium 1.9 1.7 - 2.4 mg/dL  Glucose, capillary     Status: None   Collection Time: 01/30/17  6:20 AM  Result Value Ref Range   Glucose-Capillary 99 65 - 99 mg/dL    Dg Ankle Complete Left  Result Date: 01/29/2017 CLINICAL DATA:  Ankle swelling secondary to a fall. EXAM: LEFT ANKLE COMPLETE - 3+ VIEW COMPARISON:  None FINDINGS: There is marked soft tissue swelling around the ankle. There appears to be abnormal widening of the lateral aspect of the ankle joint consistent with ligamentous disruption. No fracture or dislocation. IMPRESSION: 1. Abnormal widening of the lateral aspect of the ankle joint consistent with ligamentous disruption.  2. Prominent soft tissue swelling. Electronically Signed   By: Lorriane Shire M.D.   On: 01/29/2017 12:58   Dg Ankle Complete Right  Result Date: 01/29/2017 CLINICAL DATA:  Swollen right ankle secondary to a fall. EXAM: RIGHT ANKLE - COMPLETE 3+ VIEW COMPARISON:  None. FINDINGS: There is no fracture or dislocation. However, there is abnormal widening of the lateral aspect of the ankle joint consistent with ligamentous disruption. Small osteophytes on the distal tibia. Soft tissue swelling. Ankle effusion. IMPRESSION: Abnormal widening of the lateral aspect of the ankle joint including the tibiotalar joint consistent with significant ligamentous disruption. No fracture. Electronically Signed   By: Lorriane Shire M.D.   On: 01/29/2017  12:57    Review of Systems  Constitutional: Negative for weight loss.  HENT: Negative for ear discharge, ear pain, hearing loss and tinnitus.   Eyes: Negative for blurred vision, double vision, photophobia and pain.  Respiratory: Negative for cough, sputum production and shortness of breath.   Cardiovascular: Negative for chest pain.  Gastrointestinal: Positive for abdominal pain, nausea and vomiting.  Genitourinary: Negative for dysuria, flank pain, frequency and urgency.  Musculoskeletal: Positive for joint pain (Bilateral ankle). Negative for back pain, falls, myalgias and neck pain.  Neurological: Negative for dizziness, tingling, sensory change, focal weakness, loss of consciousness and headaches.  Endo/Heme/Allergies: Does not bruise/bleed easily.  Psychiatric/Behavioral: Negative for depression, memory loss and substance abuse. The patient is not nervous/anxious.    Blood pressure 99/76, pulse (!) 102, temperature (!) 97.5 F (36.4 C), temperature source Axillary, resp. rate 15, height 5' 6" (1.676 m), weight 68 kg (149 lb 14.6 oz), SpO2 90 %. Physical Exam  Constitutional: She appears well-developed and well-nourished. No distress.  HENT:  Head: Normocephalic.  Eyes: Conjunctivae are normal. Right eye exhibits no discharge. Left eye exhibits no discharge. No scleral icterus.  Neck: Normal range of motion.  Cardiovascular: Normal rate and regular rhythm.   Respiratory: Effort normal. No respiratory distress.  Musculoskeletal:  RLE No traumatic wounds, ecchymosis, or rash  Ankle TTP medially  No knee effusion, likely ankle effusion  Knee stable to varus/ valgus and anterior/posterior stress             Anterior drawer test stable  Sens DPN, SPN, TN intact  Motor EHL, ext, flex, evers 5/5  DP 2+, PT 1+, 2+ pitting edema  LLE No traumatic wounds, ecchymosis, or rash  Ankle TTP medially  No knee effusion, likely ankle effusion  Knee stable to varus/ valgus and  anterior/posterior stress             Anterior drawer test stable  Sens DPN, SPN, TN intact  Motor EHL, ext, flex, evers 5/5  DP 2+, PT 1+, 3+ pitting edema  Neurological: She is alert.  Skin: Skin is warm and dry. She is not diaphoretic.  Psychiatric: She has a normal mood and affect. Her behavior is normal.    Assessment/Plan: Bilateral ankle sprains, likely grade 3 on right and grade 2 or 3 on left -- Will place bilateral CAM boots, may WBAT with assistive device, advise limited time on feet. Ice to ankles qid. CAM boots should only be used for ~1 week. F/u with Dr. Zollie Beckers in 1-2 weeks.    Lisette Abu, PA-C Orthopedic Surgery [phone] 01/30/2017, 9:12 AM

## 2017-01-30 NOTE — Social Work (Signed)
CSW met with patient again to discuss SNF placement. CSW provided patient a list of bed offers. Patient has selected [facility] for SNF placement.  CSW called hospital liaison and advised of bed acceptance. SNF will obtain insurance auth from [employer].  Elissa Hefty, LCSW Clinical Social Worker [phone]

## 2017-01-31 ENCOUNTER — Inpatient Hospital Stay (HOSPITAL_COMMUNITY): Payer: Managed Care, Other (non HMO)

## 2017-01-31 DIAGNOSIS — E872 Acidosis: Secondary | ICD-10-CM

## 2017-01-31 DIAGNOSIS — D500 Iron deficiency anemia secondary to blood loss (chronic): Secondary | ICD-10-CM

## 2017-01-31 LAB — CBC
HEMATOCRIT: 22.1 % — AB (ref 36.0–46.0)
HEMOGLOBIN: 7.6 g/dL — AB (ref 12.0–15.0)
MCH: 33.8 pg (ref 26.0–34.0)
MCHC: 34.4 g/dL (ref 30.0–36.0)
MCV: 98.2 fL (ref 78.0–100.0)
Platelets: 220 10*3/uL (ref 150–400)
RBC: 2.25 MIL/uL — AB (ref 3.87–5.11)
RDW: 15.8 % — ABNORMAL HIGH (ref 11.5–15.5)
WBC: 12 10*3/uL — AB (ref 4.0–10.5)

## 2017-01-31 LAB — BASIC METABOLIC PANEL
ANION GAP: 9 (ref 5–15)
CHLORIDE: 118 mmol/L — AB (ref 101–111)
CO2: 12 mmol/L — ABNORMAL LOW (ref 22–32)
Calcium: 8.4 mg/dL — ABNORMAL LOW (ref 8.9–10.3)
Creatinine, Ser: 0.9 mg/dL (ref 0.44–1.00)
Glucose, Bld: 123 mg/dL — ABNORMAL HIGH (ref 65–99)
POTASSIUM: 4.1 mmol/L (ref 3.5–5.1)
SODIUM: 139 mmol/L (ref 135–145)

## 2017-01-31 LAB — CBC WITH DIFFERENTIAL/PLATELET
BASOS PCT: 0 %
Basophils Absolute: 0 10*3/uL (ref 0.0–0.1)
EOS PCT: 3 %
Eosinophils Absolute: 0.3 10*3/uL (ref 0.0–0.7)
LYMPHS ABS: 1.7 10*3/uL (ref 0.7–4.0)
Lymphocytes Relative: 14 %
MONO ABS: 0.8 10*3/uL (ref 0.1–1.0)
Monocytes Relative: 6 %
NEUTROS ABS: 9 10*3/uL — AB (ref 1.7–7.7)
NEUTROS PCT: 77 %
nRBC: 1 /100 WBC — ABNORMAL HIGH

## 2017-01-31 LAB — GLUCOSE, CAPILLARY
GLUCOSE-CAPILLARY: 128 mg/dL — AB (ref 65–99)
Glucose-Capillary: 124 mg/dL — ABNORMAL HIGH (ref 65–99)

## 2017-01-31 LAB — RETICULOCYTES
RBC.: 2.34 MIL/uL — AB (ref 3.87–5.11)
RETIC CT PCT: 4.4 % — AB (ref 0.4–3.1)
Retic Count, Absolute: 103 10*3/uL (ref 19.0–186.0)

## 2017-01-31 LAB — HEMOGLOBIN AND HEMATOCRIT, BLOOD
HEMATOCRIT: 23 % — AB (ref 36.0–46.0)
HEMOGLOBIN: 7.9 g/dL — AB (ref 12.0–15.0)

## 2017-01-31 LAB — MAGNESIUM: MAGNESIUM: 1.4 mg/dL — AB (ref 1.7–2.4)

## 2017-01-31 MED ORDER — CEFTRIAXONE SODIUM 1 G IJ SOLR
1.0000 g | INTRAMUSCULAR | Status: DC
  Administered 2017-01-31: 1 g via INTRAVENOUS
  Filled 2017-01-31 (×2): qty 10

## 2017-01-31 MED ORDER — MAGNESIUM SULFATE 2 GM/50ML IV SOLN
2.0000 g | Freq: Once | INTRAVENOUS | Status: AC
  Administered 2017-01-31: 2 g via INTRAVENOUS
  Filled 2017-01-31: qty 50

## 2017-01-31 MED ORDER — FUROSEMIDE 10 MG/ML IJ SOLN
20.0000 mg | Freq: Two times a day (BID) | INTRAMUSCULAR | Status: DC
  Administered 2017-01-31 – 2017-02-01 (×4): 20 mg via INTRAVENOUS
  Filled 2017-01-31 (×4): qty 2

## 2017-01-31 MED ORDER — SODIUM BICARBONATE 650 MG PO TABS
650.0000 mg | ORAL_TABLET | Freq: Three times a day (TID) | ORAL | Status: DC
  Administered 2017-01-31 – 2017-02-03 (×9): 650 mg via ORAL
  Filled 2017-01-31 (×8): qty 1

## 2017-01-31 MED ORDER — AZITHROMYCIN 500 MG PO TABS
500.0000 mg | ORAL_TABLET | ORAL | Status: DC
  Administered 2017-01-31 – 2017-02-02 (×3): 500 mg via ORAL
  Filled 2017-01-31 (×4): qty 1

## 2017-01-31 NOTE — Social Work (Signed)
[facility].  CSW will f/u for disposition to SNF.  Keene BreathPatricia Tenise Stetler, LCSW Clinical Social Worker [phone]

## 2017-01-31 NOTE — Progress Notes (Signed)
Internal Medicine Attending:   I saw and examined the patient. I reviewed the resident's note and I agree with the resident's findings and plan as documented in the resident's note.  56 year old woman admitted with acute on chronic pancreatitis which is now resolved.  Hospital course complicated by pulmonary edema which is responding well to furosemide diuresis, will continue that again today. Still having problems with malaise, not getting out of bed or working with PT.  She has a non-anion gap metabolic acidosis likely due to hyperchloremia from normal saline. I expect this to get better with diuresis, and we will add oral sodium bicarb today. She also has a progressive normocytic anemia likely due to hemorrhoidal bleeding. If she has more bleeding today, or Hgb is below 7, will offer transfusion. Plan is for discharge to subacute rehab for deconditioning, but we need to improve her respiratory status, acidosis, and anemia first; potentially discharge in 1-2 days.

## 2017-01-31 NOTE — Clinical Social Work Placement (Signed)
   CLINICAL SOCIAL WORK PLACEMENT  NOTE  Date:  01/31/2017  Patient Details  Name: Sharon Kent MRN: [id number] Date of Birth: 11-26-1960  Clinical Social Work is seeking post-discharge placement for this patient at the Skilled  Nursing Facility level of care (*CSW will initial, date and re-position this form in  chart as items are completed):  Yes   Patient/family provided with [facility] Clinical Social Work Department's list of facilities offering this level of care within the geographic area requested by the patient (or if unable, by the patient's family).  Yes   Patient/family informed of their freedom to choose among providers that offer the needed level of care, that participate in Medicare, Medicaid or managed care program needed by the patient, have an available bed and are willing to accept the patient.  Yes   Patient/family informed of [facility]'s ownership interest in [facility] and [facility], as well as of the fact that they are under no obligation to receive care at these facilities.  PASRR submitted to EDS on 01/29/17     PASRR number received on 01/29/17     Existing PASRR number confirmed on       FL2 transmitted to all facilities in geographic area requested by pt/family on 01/29/17     FL2 transmitted to all facilities within larger geographic area on       Patient informed that his/her managed care company has contracts with or will negotiate with certain facilities, including the following:        Yes   Patient/family informed of bed offers received.  [facility]     Physician recommends and patient chooses bed at      [facility]  Patient to be transferred to facility by [facility]     Patient family notified on 02/01/17 of transfer.  Name of family member notified:  patient responsible for self     PHYSICIAN Please prepare prescriptions, Please prepare  priority discharge summary, including medications     Additional Comment:    _______________________________________________ Tresa MoorePatricia V Marquize Seib, LCSW 01/31/2017, 10:49 AM

## 2017-01-31 NOTE — Progress Notes (Addendum)
Subjective:  Patient was seen sitting up in bed this morning in no acute distress. She states that she felt better overnight because she had a dose of Tramadol which helped her sleep. She states that she had some nausea overnight but no worsening of her abdominal pain. Patient did not want to eat yesterday 2/2 decreased appetite. What she did eat, however, was well tolerated without emesis. Patient continues to endorse worsening of her hemorrhoid pain, stating that yesterday the pain was so bad that she couldn't sit in her chair. She states that the creams, wipes, and sitting on a pillow has not helped with this pain. The only thing that has helped has been the IV pain medication we gave her a few days ago.   Objective:  Vital signs in last 24 hours: Vitals:   01/30/17 1700 01/30/17 1930 01/31/17 0500 01/31/17 0631  BP: 101/68 102/60  92/62  Pulse: 96 92  85  Resp: 16 20  14   Temp: 98.1 F (36.7 C) 98 F (36.7 C)  98.9 F (37.2 C)  TempSrc: Oral Oral  Oral  SpO2: 92% 90%  97%  Weight:   123 lb 14.4 oz (56.2 kg)   Height:       Physical Exam  Constitutional: She is oriented to person, place, and time.  Chronically ill appearing woman sitting up in bed with nasal cannula in place and in no acute distress. Patient is more interactive with examiner today than on previous days and more cooperative with exam.   HENT:  Mouth/Throat: Oropharynx is clear and moist.  Eyes: Pupils are equal, round, and reactive to light. EOM are normal.  Cardiovascular: Normal rate, regular rhythm and intact distal pulses.  Exam reveals no friction rub.   No murmur heard. Pulmonary/Chest:  No accessory muscle use or nasal flaring. Transmitted upper respiratory rhonchi heard throughout all lung fields, with some bibasilar crackles present.   Abdominal: Soft. Bowel sounds are normal. She exhibits no distension. There is tenderness (some tenderness with deep palpation in RUQ). There is no guarding.  Stable,  small to moderate easily reducible and non tender ventral hernia in midline of lower abdomen.   Musculoskeletal: She exhibits edema (Trace pitting edema to mid shins, improved from yesterday's exam) and tenderness (with palpation of ankle joints bilaterally). She exhibits no deformity.  Neurological: She is oriented to person, place, and time.  Face strength and sensation intact bilaterally. Tongue midline. Gross motor and sensation to light touch of upper and lower extremities intact bilaterally.  Skin: Skin is warm and dry. No rash noted. No erythema.   Assessment/Plan:  Principal Problem:   Acute on chronic pancreatitis (HCC) Active Problems:   Alcohol use disorder, mild, abuse   Hypokalemia   Pancreatic insufficiency   Hyponatremia  Earnestine MealingVictoria Nevils is a 56 yo with a PMH of chronic pancreatitis 2/2 alcohol use and pancreas divisum, GERD, asthma, and tobacco use who presented for acute worsening of chronic abdominal pain and nausea/vomiting. Her initial workup in the ED was consistent with acute pancreatitis. She was admitted to the internal medicine teaching service for management. The specific problems addressed during admission are as follows:  Acute on chronic pancreatitis: Patient now tolerting regular diet intake although patient does not really participate with care and is eating less 2/2 decreased appetite. Patient still has depressed mood, flat affect, and not really participating in her care. Recommended participating with PT today and provided chair cushion to make hemorrhoid pain mare manageable while sitting  in chair today. -Continue Creon, 72,000 units TID with meals -Continue home Tramadol 50 mg daily for pain -Continue PT and out of bed as tolerated  SOB 2/2 volume overload vs pneumonia: Work of breathing improved today, with interval improvement from yesterday's exam in bibasilar crackles. Patient's CXR showed asymmetric interstitial opacities concerning for volume  overload vs infection. Will treat for CAP to see if patient's clinical status improves.  -IV ceftriaxone 1 g, azithromycin 500 mg (start tx today, 10/24) -IV lasix 20 mg BID today  -Follow up with BMP -Strict I&O, daily weights  Bilateral ankle sprains (grade 3 R and grade 2-3 on L) s/p unwitnessed fall on 10/21: Continue orthopedic recommendations of bilateral CAM boots, elevation and ice during daytime for treatment of these injuries.  -Orthopedic surgery will follow up as outpatient in 1-2 weeks -Ibuprofen 600 mg q6 hours PRN for pain control -PT consulted, appreciate recommendations.   Hypokalemia and hyponatremia, chronic: AM labs today with Na of 139, K of 4.1. Magnesium =1.4.  -Replete Mg2+ today  -Continue K-Dur40 mEq BID  -Mg in AM  Metabolic acidosis: Patient's bicarb 12 this AM. Will provide sodium bicarbonate replacement.  -Sodium Bicarbonate 650 mg TID  Hemorrhoids: Patient's nurse reports blood in stool overnight. Patient's VS within normal limits today. Patient has chronic anemia at baseline, 2/2 alcoholism per outpatient records in chart review, with Hgb around 10-11. Hgb 11 on admission, with most recent level of 9 during early hospitalization (drop likely 2/2 dilution with large volume IVF for treatment of acute on chronic pancreatitis since all cell lines decreased on HD#2). Will follow up with CBC. -2.5% hydrocortisone cream, Tucks pads, pramoxine cream PRN -Encouraged use of bedside pillow today while sitting in chair.  -Follow up CBC from this AM  Hx of alcohol use:  -CIWA while inpatient -Daily multivitamin  Diet:HH fluid restricted, 2L VTE prophylaxis: Lovenox Code:Full  Dispo: Anticipated discharge in approximately 1 day(s) with discharge to SNF hopefully tomorrow.   Rozann Lesches, MD 01/31/2017, 7:46 AM Pager: [phone]

## 2017-01-31 NOTE — Social Work (Signed)
Clinical Social Worker facilitated patient discharge including contacting patient family and facility to confirm patient discharge plans.  Clinical information faxed to facility and family agreeable with plan.    CSW arranged ambulance transport via [facility] to [facility].    RN to call [phone] to give report prior to discharge.   Clinical Social Worker will sign off for now as social work intervention is no longer needed. Please consult us again if new need arises.  Thaddeus Evitts, LCSW Clinical Social Worker [phone]    

## 2017-02-01 ENCOUNTER — Encounter (HOSPITAL_COMMUNITY): Payer: Self-pay | Admitting: Internal Medicine

## 2017-02-01 DIAGNOSIS — R710 Precipitous drop in hematocrit: Secondary | ICD-10-CM

## 2017-02-01 DIAGNOSIS — D649 Anemia, unspecified: Secondary | ICD-10-CM

## 2017-02-01 DIAGNOSIS — K625 Hemorrhage of anus and rectum: Secondary | ICD-10-CM

## 2017-02-01 DIAGNOSIS — K6289 Other specified diseases of anus and rectum: Secondary | ICD-10-CM

## 2017-02-01 LAB — CBC
HCT: 21.1 % — ABNORMAL LOW (ref 36.0–46.0)
HCT: 29.3 % — ABNORMAL LOW (ref 36.0–46.0)
HEMOGLOBIN: 7.2 g/dL — AB (ref 12.0–15.0)
Hemoglobin: 9.8 g/dL — ABNORMAL LOW (ref 12.0–15.0)
MCH: 33.3 pg (ref 26.0–34.0)
MCH: 32.5 pg (ref 26.0–34.0)
MCHC: 34.1 g/dL (ref 30.0–36.0)
MCHC: 33.4 g/dL (ref 30.0–36.0)
MCV: 97.7 fL (ref 78.0–100.0)
MCV: 97 fL (ref 78.0–100.0)
PLATELETS: 217 10*3/uL (ref 150–400)
Platelets: 205 10*3/uL (ref 150–400)
RBC: 2.16 MIL/uL — AB (ref 3.87–5.11)
RBC: 3.02 MIL/uL — ABNORMAL LOW (ref 3.87–5.11)
RDW: 15.7 % — ABNORMAL HIGH (ref 11.5–15.5)
RDW: 15.7 % — ABNORMAL HIGH (ref 11.5–15.5)
WBC: 8.4 10*3/uL (ref 4.0–10.5)
WBC: 10.7 10*3/uL — AB (ref 4.0–10.5)

## 2017-02-01 LAB — PROCALCITONIN
PROCALCITONIN: 0.47 ng/mL
Procalcitonin: 0.47 ng/mL

## 2017-02-01 LAB — BASIC METABOLIC PANEL
Anion gap: 9 (ref 5–15)
BUN: <5 mg/dL — ABNORMAL LOW (ref 6–20)
CALCIUM: 8.5 mg/dL — AB (ref 8.9–10.3)
CHLORIDE: 113 mmol/L — AB (ref 101–111)
CO2: 16 mmol/L — AB (ref 22–32)
Creatinine, Ser: 0.84 mg/dL (ref 0.44–1.00)
GFR calc non Af Amer: >60 mL/min (ref >60)
Glucose, Bld: 105 mg/dL — ABNORMAL HIGH (ref 65–99)
Potassium: 4.4 mmol/L (ref 3.5–5.1)
Sodium: 138 mmol/L (ref 135–145)

## 2017-02-01 LAB — PREPARE RBC (CROSSMATCH)

## 2017-02-01 LAB — MAGNESIUM: MAGNESIUM: 2.1 mg/dL (ref 1.7–2.4)

## 2017-02-01 LAB — GLUCOSE, CAPILLARY: GLUCOSE-CAPILLARY: 95 mg/dL (ref 65–99)

## 2017-02-01 MED ORDER — ORAL CARE MOUTH RINSE
15.0000 mL | Freq: Two times a day (BID) | OROMUCOSAL | Status: DC
  Administered 2017-02-02 – 2017-02-03 (×2): 15 mL via OROMUCOSAL

## 2017-02-01 MED ORDER — WHITE PETROLATUM EX OINT
TOPICAL_OINTMENT | CUTANEOUS | Status: AC
  Administered 2017-02-01: 1
  Filled 2017-02-01: qty 28.35

## 2017-02-01 MED ORDER — PEG-KCL-NACL-NASULF-NA ASC-C 100 G PO SOLR: 1.0000 | Freq: Once | ORAL | Status: DC

## 2017-02-01 MED ORDER — PEG-KCL-NACL-NASULF-NA ASC-C 100 G PO SOLR
0.5000 | Freq: Once | ORAL | Status: AC
  Administered 2017-02-01: 100 g via ORAL
  Filled 2017-02-01: qty 1

## 2017-02-01 MED ORDER — PEG-KCL-NACL-NASULF-NA ASC-C 100 G PO SOLR
0.5000 | Freq: Once | ORAL | Status: AC
  Administered 2017-02-01: 100 g via ORAL

## 2017-02-01 MED ORDER — SODIUM CHLORIDE 0.9 % IV SOLN
Freq: Once | INTRAVENOUS | Status: AC
  Administered 2017-02-01: 10 mL via INTRAVENOUS

## 2017-02-01 MED ORDER — LIDOCAINE 4 % EX CREA
TOPICAL_CREAM | CUTANEOUS | Status: DC | PRN
  Administered 2017-02-02 – 2017-02-03 (×4): 1 via TOPICAL
  Filled 2017-02-01: qty 5

## 2017-02-01 NOTE — Progress Notes (Signed)
Internal Medicine Attending:   I saw and examined the patient. I reviewed the resident's note and I agree with the resident's findings and plan as documented in the resident's note.  56 year old woman hospital day #6 with hypoxia due to both symptomatic anemia and mild pulmonary edema. Plan to transfuse one unit pRBC today. Source of blood loss is from rectal hemorrhoids, she reports bleeding each time she sits on the toilet, about 5 times daily. We have watched her hemoglobin trend down from 12 to 7.2g over the last one week. Retic counts are mildly elevated. Normocytic. Plan is to consult with GI surgery about treatment options. Will continue with oral furosemide for volume overload, antibiotic course for possible pneumonia also is reasonable given her cough, infiltrate, and procalcitonin.

## 2017-02-01 NOTE — Consult Note (Signed)
Consultation  Referring Provider:  Dr. Evette Doffing     Primary Care Physician:  Lucious Groves, DO Primary Gastroenterologist: [facility])        Reason for Consultation: Rectal Bleeding, Anemia          HPI:   Sharon Kent is a 56 y.o. female with past medical history of GERD, chronic pancreatitis and ETOH abuse, as well as others listed below who presented to the ER on 01/25/17 with increasing nausea, vomiting and oral intolerance. She had persistent epigastric and LUQ abdominal pain. She was diagnosed with acute on chronic pancreatitis and has been receiving treatment for this. This has been improving. We are consulted now for rectal bleeding and anemia.    Patient had exam by hospitalist with multiple thrombosed external hemorrhoids and patient expressed that it hurt too much to even sit in the chair. Also described passing blood with every BM.   At time of my interview today, the patient is found dozing in and out of sleep in her bed. She explains that this "rectal bleeding" has been going on for 2 weeks, "off and on", but over the past 2 days this has been worse and with every BM which have been more frequent during the past few days, up to 2-3 times per day.  Patient is a poor historian and is unable to fully describe exactly what has been going on, but does tell me that her last colonoscopy was at [place] 5 [place] and she was told to repeat in 10 years. Patient expresses a large amount of rectal pain currently, even just laying on the bed, so much so that "I don't want anyone else to look at it".  She has been receiving Hydrocortisone cream as ordered by internal med team. Patient denies episodes of bleeding w/o a BM.   Generally, the patient remains somewhat weak and is still requiring O2 which she does not require at home.   Patient denies fever, chills, weight loss, heartburn, reflux or continued abdominal pain (other than chronic epigastric  pain).  Previous Gi History: 12/21/16-DrBobby Rumpf, [facility]: Follow up for chronic pancreatitis, previous diagnosis of pancreas divisum, stents placed from March-July of this year, complained of bloating and upper abdominal pain at that time, constipation-described a colo 5 years ago in [place] and was apparently told to follow up in 10 yrs, continued drinking 1-2 drinks per week, MRI was ordered at that time (see care everywhere for further information)  Past Medical History:  Diagnosis Date  . Asthma   . GERD (gastroesophageal reflux disease)   . H/O chronic pancreatitis   . H/O ETOH abuse   . Headache    "monthly" (06/15/2016)  . History of blood transfusion 2000s   "when they explored my abdomen"  . Hx of pulmonary embolus   . Necrotizing pancreatitis 2015  . 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 Pancreatic abscess   . Pneumonia    "twice at least" (06/15/2016)    Past Surgical History:  Procedure Laterality Date  . COLONOSCOPY    . ERCP  2018   multiple  . EXPLORATORY LAPAROTOMY  2000s   "took out all my organs and washed the dead tissue"  . IVC FILTER PLACEMENT ([facility] HX)  12/2010?  Marland Kitchen LAPAROSCOPIC CHOLECYSTECTOMY  2000s    Family History  Problem Relation Age of Onset  . Adopted: Yes  . Family history unknown: Yes  Social History  Substance Use Topics  . Smoking status: Current Some Day Smoker    Packs/day: 0.50    Years: 31.00    Types: Cigarettes  . Smokeless tobacco: Never Used     Comment: DOWN TO 2 CIGARETTES A DAY  . Alcohol use Yes     Comment: yesterday -- 2 drinks    Prior to Admission medications   Medication Sig Start Date End Date Taking? Authorizing Provider  Albuterol Sulfate 108 (90 Base) MCG/ACT AEPB Inhale 2 puffs into the lungs as needed. Patient taking differently: Inhale 2 puffs into the lungs every 4 (four) hours as needed (for shortness of breath).  06/05/16  Yes Burns, Arloa Koh, MD  Multiple Vitamins-Minerals (MULTIVITAMIN WITH  MINERALS) tablet Take 1 tablet by mouth daily.   Yes [provider]  Pancrelipase, Lip-Prot-Amyl, 24000-76000 units CPEP Take 2 capsules (48,000 Units total) by mouth 3 (three) times daily with meals. 08/28/16  Yes Rice, Resa Miner, MD  pantoprazole (PROTONIX) 40 MG tablet Take 1 tablet (40 mg total) by mouth daily. 09/07/16  Yes Lucious Groves, DO  potassium chloride SA (K-DUR,KLOR-CON) 20 MEQ tablet Take 2 tablets (40 mEq total) by mouth daily. Patient taking differently: Take 20 mEq by mouth daily.  07/21/16  Yes Rivet, Sindy Guadeloupe, MD  traMADol (ULTRAM) 50 MG tablet Take 1 tablet (50 mg total) by mouth daily. Patient taking differently: Take 50 mg by mouth as needed for moderate pain.  01/10/17  Yes Burgess Estelle, MD  phenylephrine (,USE FOR PREPARATION-H,) 0.25 % suppository Place 1 suppository rectally 2 (two) times daily. Patient not taking: Reported on 01/25/2017 01/10/17   Burgess Estelle, MD  witch hazel-glycerin (TUCKS) pad Apply 1 application topically as needed for itching. Patient not taking: Reported on 01/19/2017 01/10/17   Burgess Estelle, MD    Current Facility-Administered Medications  Medication Dose Route Frequency Provider Last Rate Last Dose  . azithromycin (ZITHROMAX) tablet 500 mg  500 mg Oral Q24H Nedrud, Marybeth, MD   500 mg at 02/01/17 1643  . feeding supplement (BOOST / RESOURCE BREEZE) liquid 1 Container  1 Container Oral BID BM Axel Filler, MD   1 Container at 02/01/17 1056  . folic acid (FOLVITE) tablet 1 mg  1 mg Oral Daily Molt, Bethany, DO   1 mg at 02/01/17 0931  . furosemide (LASIX) injection 20 mg  20 mg Intravenous BID Nedrud, Larena Glassman, MD   20 mg at 02/01/17 0931  . hydrocortisone (ANUSOL-HC) 2.5 % rectal cream   Rectal QID Melanee Spry, MD      . ibuprofen (ADVIL,MOTRIN) tablet 600 mg  600 mg Oral Q6H PRN Thomasene Ripple, MD   600 mg at 02/01/17 0359  . Influenza vac split quadrivalent PF (FLUARIX) injection 0.5 mL  0.5 mL  Intramuscular Tomorrow-1000 Axel Filler, MD      . lidocaine (LMX) 4 % cream   Topical Q4H PRN Nedrud, Larena Glassman, MD      . lipase/protease/amylase (CREON) capsule 72,000 Units  72,000 Units Oral TID WC Thomasene Ripple, MD   72,000 Units at 02/01/17 1643  . multivitamin with minerals tablet 1 tablet  1 tablet Oral Daily Molt, Bethany, DO   1 tablet at 02/01/17 0931  . ondansetron (ZOFRAN) injection 4 mg  4 mg Intravenous Q6H PRN Nedrud, Larena Glassman, MD   4 mg at 01/30/17 0419  . pantoprazole (PROTONIX) EC tablet 40 mg  40 mg Oral Daily Molt, Bethany, DO   40 mg at  02/01/17 0931  . peg 3350 powder (MOVIPREP) kit 100 g  0.5 kit Oral Once Blenda Nicely, [facility]      . potassium chloride SA (K-DUR,KLOR-CON) CR tablet 40 mEq  40 mEq Oral BID Thomasene Ripple, MD   40 mEq at 02/01/17 0931  . sodium bicarbonate tablet 650 mg  650 mg Oral TID Thomasene Ripple, MD   650 mg at 02/01/17 1643  . thiamine (VITAMIN B-1) tablet 100 mg  100 mg Oral Daily Molt, Bethany, DO   100 mg at 02/01/17 0931  . traMADol (ULTRAM) tablet 50 mg  50 mg Oral Daily PRN Nedrud, Larena Glassman, MD   50 mg at 02/01/17 1245  . witch hazel-glycerin (TUCKS) pad   Topical PRN Valinda Party, DO        Allergies as of 01/25/2017 - Review Complete 01/25/2017  Allergen Reaction Noted  . Morphine Nausea Only 11/23/2015  . Oxycodone-acetaminophen Other (See Comments) 12/26/2012     Review of Systems:    Constitutional: Positive for weakness Skin: No rash Cardiovascular: No chest pain  Respiratory: Positive for SOB Gastrointestinal: See HPI and otherwise negative Genitourinary: No dysuria  Neurological: No headache Musculoskeletal: No new muscle or joint pain Hematologic: No bruising Psychiatric: No history of depression or anxiety   Physical Exam:  Vital signs in last 24 hours: Temp:  [97.3 F (36.3 C)-97.8 F (36.6 C)] 97.3 F (36.3 C) (10/25 1627) Pulse Rate:  [81-100] 84 (10/25 1556) Resp:  [14-20] 14 (10/25  1556) BP: (89-113)/(55-90) 89/59 (10/25 1627) SpO2:  [87 %-97 %] 90 % (10/25 1556) Weight:  [123 lb 14.4 oz (56.2 kg)] 123 lb 14.4 oz (56.2 kg) (10/25 0500) Last BM Date: 02/01/17 General:   Caucasian female appears to be in NAD, Well developed, Well nourished, alert and cooperative Head:  Normocephalic and atraumatic. Eyes:   PEERL, EOMI. No icterus. Conjunctiva pink. Ears:  Normal auditory acuity. Neck:  Supple Throat: Oral cavity and pharynx without inflammation, swelling or lesion.  Lungs: Respirations even and unlabored. B/l crackles, On O2 via [place] Heart: Normal S1, S2. No MRG. Regular rate and rhythm. No peripheral edema, cyanosis or pallor.  Abdomen:  Soft, nondistended, nontender. No rebound or guarding. Normal bowel sounds. No appreciable masses or hepatomegaly. Rectal:  Patient declined Msk:  Symmetrical without gross deformities.  Extremities:  1+ pitting edema to mid shins, b/l with tenderness, no deformity or joint abnormality.  Neurologic:  Alert and  oriented x4;  grossly normal neurologically.  Skin:   Dry and intact without significant lesions or rashes. Psychiatric: Demonstrates good judgement and reason without abnormal affect or behaviors.   LAB RESULTS:  Recent Labs  01/31/17 0948 01/31/17 1215 01/31/17 1545 02/01/17 0546  WBC 12.0* DUP SEE [id number]  --  8.4  HGB 7.6* DUP SEE [id number] 7.9* 7.2*  HCT 22.1* DUP SEE [id number] 23.0* 21.1*  PLT 220 DUP SEE [id number]  --  205   BMET  Recent Labs  01/30/17 0719 01/31/17 0338 02/01/17 0546  NA 137 139 138  K 3.9 4.1 4.4  CL 115* 118* 113*  CO2 15* 12* 16*  GLUCOSE 110* 123* 105*  BUN <5* <5* <5*  CREATININE 0.60 0.90 0.84  CALCIUM 8.6* 8.4* 8.5*   STUDIES: Dg Chest 2 View  Result Date: 01/31/2017 CLINICAL DATA:  Shortness of breath. EXAM: CHEST  2 VIEW COMPARISON:  Chest CT 12/18/2016 and radiograph 04/23/2016 FINDINGS: The cardiomediastinal silhouette is unchanged and within normal limits. Aortic atherosclerosis  is noted.  There is extensive interstitial opacity throughout the left lung with milder interstitial opacity in the right mid and lower lung. The left lung opacities are stable to mildly increased compared to the January radiograph with the right lung opacities being improved, however these had all cleared on the more recent CT. Underlying emphysema is noted. No sizable pleural effusion or pneumothorax is identified. An IVC filter is partially visualized in the abdomen. IMPRESSION: Recurrent extensive left and mild right interstitial lung opacities which may reflect asymmetric edema or infection. Electronically Signed   By: Logan Bores M.D.   On: 01/31/2017 13:19   PREVIOUS ENDOSCOPIES:            See HPI-colo 5 yrs ago diff state   Impression / Plan:   Impression: 1. Rectal bleeding/pain: apparently thrombosed ext hemorrhoids on exam (patient would not let me look today), ? Fissure by hx, last colo 5 yrs aso per pt report, must consider other causes of bleeding; Consider diverticular vs polyp vs colitis vs hemorrhoids vs less likely upper gi bleeding 2. Acute on chronic anemia: baseline hgb around 10-11 (thought due to alcohol use), hgb today 7.2, increased bleeding, bright red blood, for 2 days as above, pt now s/p 1 u prbcs 3. Acute on chronic Pancreatitis: improved-patient now tolerating regular diet  Plan: 1. Scheduled patient for colonoscopy tomorrow morning with Dr. Carlean Purl. Did review risks, benefits, limitations and alternatives and the patient agrees to proceed. Patient will be on clears and then full NPO by 5 am. 2. Continue other supportive measures 3. Would recommend Sitz baths 15-20 min BID, Hydrocortisone ointment BID and Recta care/Lidocaine cream to hemorrhoids q4-6 hours  4. Agree with blood transfusion and agree with continued monitoring of hgb with transfusion as needed 5. Please await final recommendations from Dr. Carlean Purl later today  Thank you for your kind consultation, we  will continue to follow.  Silvano Rusk  02/01/2017, 6:22 PM Pager #: [phone]    [facility] Attending   I have taken an interval history, reviewed the chart and examined the patient. I agree with the Advanced Practitioner's note, impression and recommendations.   Probably anorectal problems as cause but makes sense to fully evaluate colon. She could need EUA and possible lat int sphx.  The risks and benefits as well as alternatives of endoscopic procedure(s) have been discussed and reviewed. All questions answered. The patient agrees to proceed.  I appreciate the opportunity to care for her. Gatha Mayer, MD, [facility] [phone] (pager) 02/01/2017 6:22 PM

## 2017-02-01 NOTE — Social Work (Signed)
CSW discussed with [facility] that patient will discharge tomorrow. [employer] [employer] auth already obtained.  CSW will f/u.  Keene BreathPatricia Tiaunna Buford, LCSW Clinical Social Worker [phone]

## 2017-02-01 NOTE — Progress Notes (Signed)
Subjective:  Patient seen sitting in bed this AM in no acute distress. Patient continues to complain of hemorrhoid pain that occurs almost constantly. She endorses blood loss with every bowel and urinary movement. She is unable to sit in her chair because the pain is so severe. She thinks that her breathing is a little better than yesterday. She continues to endorse overall malaise and fatigue.   Objective:  Vital signs in last 24 hours: Vitals:   01/31/17 1941 02/01/17 0403 02/01/17 0500 02/01/17 1000  BP: 113/68 94/61  (!) 98/55  Pulse: 85 86  81  Resp: 20 17  16   Temp: (!) 97.3 F (36.3 C)   97.8 F (36.6 C)  TempSrc: Oral   Oral  SpO2: 90% (!) 87%  97%  Weight:   123 lb 14.4 oz (56.2 kg)   Height:       Physical Exam  Constitutional: She appears well-developed and well-nourished. No distress.  HENT:  Mouth/Throat: Oropharynx is clear and moist.  Cardiovascular: Normal rate, regular rhythm and intact distal pulses.  Exam reveals no friction rub.   No murmur heard. Pulmonary/Chest: Effort normal. No respiratory distress. She has no wheezes.  Bibasilar crackles, stable from previous exams  Abdominal: Soft. She exhibits no distension. There is no tenderness. There is no guarding.  Genitourinary:  Genitourinary Comments: Multiple, thrombosed external hemorrhoids present with erythema throughout. NO active bleeding.  Musculoskeletal: She exhibits edema (1+ pitting edema to the mid shins, bilaterally) and tenderness (with palpation of lower extremities).  Skin: Skin is warm and dry. No rash noted. No erythema.   Assessment/Plan:  Principal Problem:   Acute on chronic pancreatitis (HCC) Active Problems:   Alcohol use disorder, mild, abuse   Hypokalemia   Pancreatic insufficiency   Hyponatremia  Sharon Kent is a 56 yo with a PMH of chronic pancreatitis 2/2 alcohol use and pancreas divisum, GERD, asthma, and tobacco use who presented for acute worsening of chronic  abdominal pain and nausea/vomiting. Her initial workup in the ED was consistent with acute pancreatitis. She was admitted to the internal medicine teaching service for management. The specific problems addressed during admission are as follows:  SOB 2/2 volume overload vs pneumonia: Patient clinically improving with IV diuresis and treatment of community acquired pneumonia. Will continue to clinically monitor -IV lasix 20 mg BID today  -Continue azithromycin 500 mg daily (day 2 of 5) -Strict I&O, daily weights -Wean oxygen as tolerated  Acute on chronic anemia 2/2 hemorrhoidal bleeding: Per chart review patient's baseline Hgb between 10-11 (chronic anemia 2/2 alcohol use). Hgb today 7.2. Patient reports increased bright red blood with every bowel movement yesterday and today, which corresponds to this acute drop in Hgb. It is possible that the patient's acute worsening of chronic anemia is contributing to her SOB and decreased energy level. Will transfuse today and obtain surgery consult so that patient can be evaluated for removal of external hemorrhoids. -Transfuse 1 unit pRBCs today, follow up post-transfusion H/H -2.5% hydrocortisone cream, Tucks pads, pramoxine cream PRN -Encouraged use of bedside pillow while sitting in chair -Surgery consult for external hemorrhoid removal  Hypokalemia and hyponatremia, chronic with metabolic acidosis: AM labs today with Na of 138, K of 4.4, Mg =2.1, and bicarb of 16, which is improved from yesterday's 12. Will continue with bicarb replacement and with electrolyte monitoring  -Continue K-Dur40 mEq BID  -Sodium Bicarbonate 650 mg TID -BMP in AM  Acute on chronic pancreatitis: Patient now tolerting regular diet intake  without PRN medications/emesis. Will continue supportive care as needed. -Continue Creon, 72,000 units TID with meals -Continue home Tramadol 50 mg daily for pain  Bilateral ankle sprains (grade 3 R and grade 2-3 on L) s/p unwitnessed  fall on 10/21: Continue orthopedic recommendations of bilateral CAM boots, elevation and ice during daytime for treatment of these injuries.  -Orthopedic surgery will follow up as outpatient in 1-2 weeks -Ibuprofen 600 mg q6 hours PRN for pain control -PT consulted, appreciate recommendations.   Hx of alcohol use:  -CIWA while inpatient -Daily multivitamin  Diet:HH fluid restricted, 2L VTE prophylaxis: Lovenox Code:Full  Dispo: Anticipated discharge in approximately 1-2 day(s).   Rozann LeschesNedrud, Matsuko Kretz, MD 02/01/2017, 11:27 AM Pager: [phone]

## 2017-02-01 NOTE — Consult Note (Signed)
[facility] Consult Note  Sharon Kent Mar 18, 1961  [id number].    Requesting MD: Evette Doffing, MD Chief Complaint/Reason for Consult: lower GIB, hemorrhoids   HPI:  [name] is a 56 y/o female admitted to the hospital for acute on chronic pancreatitis 2/2 EtOH abuse. She reports she started having bright red blood on her toilet paper about two months ago and was diagnosed with external hemorrhoids. Since that time her rectal bleeding has worsened and led to symptomatic, normocytic anemia (SOB, DOE, palpitations). Her bleeding and pain with bowel movements have been refractory to preparation H, TUCKS pads, and topical steroids. The patient denies the feeling of prolapsing tissue per rectum with bowel movements/straining. At baseline she has diarrhea and denies constipation. Her last colonoscopy was in 2014 and patient denies a personal history of colon cancer.   ROS: Review of Systems  Constitutional: Positive for malaise/fatigue and weight loss. Negative for chills and fever.  Respiratory: Positive for cough.   Gastrointestinal: Positive for abdominal pain (epigastric), blood in stool, nausea and vomiting.  Genitourinary: Negative.   All other systems reviewed and are negative.   Family History  Problem Relation Age of Onset  . Adopted: Yes  . Family history unknown: Yes    Past Medical History:  Diagnosis Date  . Asthma   . Family history of adverse reaction to anesthesia       . GERD (gastroesophageal reflux disease)   . H/O chronic pancreatitis   . H/O ETOH abuse   . Headache    "monthly" (06/15/2016)  . History of blood transfusion 2000s   "when they explored my abdomen"  . Hx of pulmonary embolus   . Pancreatic abscess   . Pneumonia    "twice at least" (06/15/2016)    Past Surgical History:  Procedure Laterality Date  . EXPLORATORY LAPAROTOMY  2000s   "took out all my organs and washed the dead tissue"  . IVC FILTER PLACEMENT ([facility] HX)  12/2010?  Marland Kitchen  LAPAROSCOPIC CHOLECYSTECTOMY  2000s    Social History:  reports that she has been smoking Cigarettes.  She has a 15.50 pack-year smoking history. She has never used smokeless tobacco. She reports that she drinks alcohol. She reports that she does not use drugs.  Allergies:  Allergies  Allergen Reactions  . Morphine Nausea Only  . Oxycodone-Acetaminophen Other (See Comments)    Makes her "loopy."    Medications Prior to Admission  Medication Sig Dispense Refill  . Albuterol Sulfate 108 (90 Base) MCG/ACT AEPB Inhale 2 puffs into the lungs as needed. (Patient taking differently: Inhale 2 puffs into the lungs every 4 (four) hours as needed (for shortness of breath). ) 1 each 11  . Multiple Vitamins-Minerals (MULTIVITAMIN WITH MINERALS) tablet Take 1 tablet by mouth daily.    . Pancrelipase, Lip-Prot-Amyl, 24000-76000 units CPEP Take 2 capsules (48,000 Units total) by mouth 3 (three) times daily with meals. 270 capsule 3  . pantoprazole (PROTONIX) 40 MG tablet Take 1 tablet (40 mg total) by mouth daily. 90 tablet 3  . potassium chloride SA (K-DUR,KLOR-CON) 20 MEQ tablet Take 2 tablets (40 mEq total) by mouth daily. (Patient taking differently: Take 20 mEq by mouth daily. ) 180 tablet 0  . traMADol (ULTRAM) 50 MG tablet Take 1 tablet (50 mg total) by mouth daily. (Patient taking differently: Take 50 mg by mouth as needed for moderate pain. ) 45 tablet 0  . phenylephrine (,USE FOR PREPARATION-H,) 0.25 % suppository Place 1 suppository rectally 2 (two)  times daily. (Patient not taking: Reported on 01/25/2017) 12 suppository 0  . witch hazel-glycerin (TUCKS) pad Apply 1 application topically as needed for itching. (Patient not taking: Reported on 01/19/2017) 40 each 0    Blood pressure 105/90, pulse 100, temperature 97.8 F (36.6 C), temperature source Oral, resp. rate 15, height '5\' 6"'  (1.676 m), weight 56.2 kg (123 lb 14.4 oz), SpO2 95 %. Physical Exam: Physical Exam  Constitutional: She is  oriented to person, place, and time. She appears well-developed. No distress.  HENT:  Head: Normocephalic and atraumatic.  Right Ear: External ear normal.  Left Ear: External ear normal.  Eyes: Pupils are equal, round, and reactive to light. EOM are normal. Right eye exhibits no discharge. Left eye exhibits no discharge.  Neck: Normal range of motion. No tracheal deviation present.  Cardiovascular: Normal rate, regular rhythm and normal heart sounds.  Exam reveals no friction rub.   No murmur heard. Pulmonary/Chest: Effort normal. No stridor. No respiratory distress. She has rales. She exhibits no tenderness.  Abdominal: Soft. She exhibits no distension. There is no tenderness. There is no guarding.  Genitourinary:  Genitourinary Comments: See image. External, non-bleeding, non-erythematous hemorrhoids noted. Prolapsed internal hemorrhoid present without evidence of ulceration or bleeding. TTP at anal verge. Unable to perform complete DRE 2/2 pain.   Musculoskeletal: Normal range of motion. She exhibits no edema or deformity.  Neurological: She is alert and oriented to person, place, and time. No sensory deficit.  Skin: Skin is warm and dry. No rash noted. She is not diaphoretic.  Psychiatric: Her behavior is normal.  Flat affect       Results for orders placed or performed during the hospital encounter of 01/25/17 (from the past 48 hour(s))  Basic metabolic panel     Status: Abnormal   Collection Time: 01/31/17  3:38 AM  Result Value Ref Range   Sodium 139 135 - 145 mmol/L   Potassium 4.1 3.5 - 5.1 mmol/L   Chloride 118 (H) 101 - 111 mmol/L   CO2 12 (L) 22 - 32 mmol/L   Glucose, Bld 123 (H) 65 - 99 mg/dL   BUN <5 (L) 6 - 20 mg/dL   Creatinine, Ser 0.90 0.44 - 1.00 mg/dL   Calcium 8.4 (L) 8.9 - 10.3 mg/dL   GFR calc non Af Amer >60 >60 mL/min   GFR calc Af Amer >60 >60 mL/min    Comment: (NOTE) The eGFR has been calculated using the CKD EPI equation. This calculation has not  been validated in all clinical situations. eGFR's persistently <60 mL/min signify possible Chronic Kidney Disease.    Anion gap 9 5 - 15  Magnesium     Status: Abnormal   Collection Time: 01/31/17  3:38 AM  Result Value Ref Range   Magnesium 1.4 (L) 1.7 - 2.4 mg/dL  Glucose, capillary     Status: Abnormal   Collection Time: 01/31/17  6:29 AM  Result Value Ref Range   Glucose-Capillary 128 (H) 65 - 99 mg/dL  CBC     Status: Abnormal   Collection Time: 01/31/17  9:48 AM  Result Value Ref Range   WBC 12.0 (H) 4.0 - 10.5 K/uL   RBC 2.25 (L) 3.87 - 5.11 MIL/uL   Hemoglobin 7.6 (L) 12.0 - 15.0 g/dL   HCT 22.1 (L) 36.0 - 46.0 %   MCV 98.2 78.0 - 100.0 fL   MCH 33.8 26.0 - 34.0 pg   MCHC 34.4 30.0 - 36.0 g/dL   RDW  15.8 (H) 11.5 - 15.5 %   Platelets 220 150 - 400 K/uL  CBC with Differential/Platelet     Status: Abnormal   Collection Time: 01/31/17 12:15 PM  Result Value Ref Range   WBC DUP SEE Q6578 4.0 - 10.5 K/uL   RBC DUP SEE W1833 3.87 - 5.11 MIL/uL   Hemoglobin DUP SEE W1833 12.0 - 15.0 g/dL   HCT DUP SEE W1833 36.0 - 46.0 %   MCV DUP SEE W1833 78.0 - 100.0 fL   MCH DUP SEE W1833 26.0 - 34.0 pg   MCHC DUP SEE W1833 30.0 - 36.0 g/dL   RDW DUP SEE W1833 11.5 - 15.5 %   Platelets DUP SEE W1833 150 - 400 K/uL   Neutro Abs 9.0 (H) 1.7 - 7.7 K/uL   Lymphs Abs 1.7 0.7 - 4.0 K/uL   Monocytes Absolute 0.8 0.1 - 1.0 K/uL   Eosinophils Absolute 0.3 0.0 - 0.7 K/uL   Basophils Absolute 0.0 0.0 - 0.1 K/uL   Neutrophils Relative % 77 %   Lymphocytes Relative 14 %   Monocytes Relative 6 %   Eosinophils Relative 3 %   Basophils Relative 0 %   nRBC 1 (H) 0 /100 WBC   RBC Morphology TEARDROP CELLS     Comment: RARE NRBCs  Hemoglobin and hematocrit, blood     Status: Abnormal   Collection Time: 01/31/17  3:45 PM  Result Value Ref Range   Hemoglobin 7.9 (L) 12.0 - 15.0 g/dL   HCT 23.0 (L) 36.0 - 46.0 %  Reticulocytes     Status: Abnormal   Collection Time: 01/31/17  3:45 PM  Result  Value Ref Range   Retic Ct Pct 4.4 (H) 0.4 - 3.1 %   RBC. 2.34 (L) 3.87 - 5.11 MIL/uL   Retic Count, Absolute 103.0 19.0 - 186.0 K/uL  Glucose, capillary     Status: Abnormal   Collection Time: 01/31/17  9:11 PM  Result Value Ref Range   Glucose-Capillary 124 (H) 65 - 99 mg/dL  Procalcitonin - Baseline     Status: None   Collection Time: 01/31/17 10:34 PM  Result Value Ref Range   Procalcitonin 0.47 ng/mL    Comment:        Interpretation: PCT (Procalcitonin) <= 0.5 ng/mL: Systemic infection (sepsis) is not likely. Local bacterial infection is possible. (NOTE)         ICU PCT Algorithm               Non ICU PCT Algorithm    ----------------------------     ------------------------------         PCT < 0.25 ng/mL                 PCT < 0.1 ng/mL     Stopping of antibiotics            Stopping of antibiotics       strongly encouraged.               strongly encouraged.    ----------------------------     ------------------------------       PCT level decrease by               PCT < 0.25 ng/mL       >= 80% from peak PCT       OR PCT 0.25 - 0.5 ng/mL          Stopping of antibiotics  encouraged.     Stopping of antibiotics           encouraged.    ----------------------------     ------------------------------       PCT level decrease by              PCT >= 0.25 ng/mL       < 80% from peak PCT        AND PCT >= 0.5 ng/mL            Continuin g antibiotics                                              encouraged.       Continuing antibiotics            encouraged.    ----------------------------     ------------------------------     PCT level increase compared          PCT > 0.5 ng/mL         with peak PCT AND          PCT >= 0.5 ng/mL             Escalation of antibiotics                                          strongly encouraged.      Escalation of antibiotics        strongly encouraged.   Basic metabolic panel     Status: Abnormal    Collection Time: 02/01/17  5:46 AM  Result Value Ref Range   Sodium 138 135 - 145 mmol/L   Potassium 4.4 3.5 - 5.1 mmol/L   Chloride 113 (H) 101 - 111 mmol/L   CO2 16 (L) 22 - 32 mmol/L   Glucose, Bld 105 (H) 65 - 99 mg/dL   BUN <5 (L) 6 - 20 mg/dL   Creatinine, Ser 0.84 0.44 - 1.00 mg/dL   Calcium 8.5 (L) 8.9 - 10.3 mg/dL   GFR calc non Af Amer >60 >60 mL/min   GFR calc Af Amer >60 >60 mL/min    Comment: (NOTE) The eGFR has been calculated using the CKD EPI equation. This calculation has not been validated in all clinical situations. eGFR's persistently <60 mL/min signify possible Chronic Kidney Disease.    Anion gap 9 5 - 15  CBC     Status: Abnormal   Collection Time: 02/01/17  5:46 AM  Result Value Ref Range   WBC 8.4 4.0 - 10.5 K/uL   RBC 2.16 (L) 3.87 - 5.11 MIL/uL   Hemoglobin 7.2 (L) 12.0 - 15.0 g/dL   HCT 21.1 (L) 36.0 - 46.0 %   MCV 97.7 78.0 - 100.0 fL   MCH 33.3 26.0 - 34.0 pg   MCHC 34.1 30.0 - 36.0 g/dL   RDW 15.7 (H) 11.5 - 15.5 %   Platelets 205 150 - 400 K/uL  Magnesium     Status: None   Collection Time: 02/01/17  5:46 AM  Result Value Ref Range   Magnesium 2.1 1.7 - 2.4 mg/dL  Procalcitonin     Status: None   Collection Time: 02/01/17  5:46 AM  Result Value Ref Range   Procalcitonin 0.47 ng/mL  Comment:        Interpretation: PCT (Procalcitonin) <= 0.5 ng/mL: Systemic infection (sepsis) is not likely. Local bacterial infection is possible. (NOTE)         ICU PCT Algorithm               Non ICU PCT Algorithm    ----------------------------     ------------------------------         PCT < 0.25 ng/mL                 PCT < 0.1 ng/mL     Stopping of antibiotics            Stopping of antibiotics       strongly encouraged.               strongly encouraged.    ----------------------------     ------------------------------       PCT level decrease by               PCT < 0.25 ng/mL       >= 80% from peak PCT       OR PCT 0.25 - 0.5 ng/mL           Stopping of antibiotics                                             encouraged.     Stopping of antibiotics           encouraged.    ----------------------------     ------------------------------       PCT level decrease by              PCT >= 0.25 ng/mL       < 80% from peak PCT        AND PCT >= 0.5 ng/mL            Continuin g antibiotics                                              encouraged.       Continuing antibiotics            encouraged.    ----------------------------     ------------------------------     PCT level increase compared          PCT > 0.5 ng/mL         with peak PCT AND          PCT >= 0.5 ng/mL             Escalation of antibiotics                                          strongly encouraged.      Escalation of antibiotics        strongly encouraged.   Glucose, capillary     Status: None   Collection Time: 02/01/17  6:25 AM  Result Value Ref Range   Glucose-Capillary 95 65 - 99 mg/dL  Prepare RBC     Status: None   Collection Time: 02/01/17 11:31 AM  Result Value Ref Range  Order Confirmation ORDER PROCESSED BY BLOOD BANK   Type and screen [place]     Status: None (Preliminary result)   Collection Time: 02/01/17 11:46 AM  Result Value Ref Range   ABO/RH(D) A NEG    Antibody Screen NEG    Sample Expiration 02/04/2017    Unit Number [id number]    Blood Component Type RED CELLS,LR    Unit division 00    Status of Unit ALLOCATED    Transfusion Status OK TO TRANSFUSE    Crossmatch Result Compatible    Dg Chest 2 View  Result Date: 01/31/2017 CLINICAL DATA:  Shortness of breath. EXAM: CHEST  2 VIEW COMPARISON:  Chest CT 12/18/2016 and radiograph 04/23/2016 FINDINGS: The cardiomediastinal silhouette is unchanged and within normal limits. Aortic atherosclerosis is noted. There is extensive interstitial opacity throughout the left lung with milder interstitial opacity in the right mid and lower lung. The left lung opacities are  stable to mildly increased compared to the January radiograph with the right lung opacities being improved, however these had all cleared on the more recent CT. Underlying emphysema is noted. No sizable pleural effusion or pneumothorax is identified. An IVC filter is partially visualized in the abdomen. IMPRESSION: Recurrent extensive left and mild right interstitial lung opacities which may reflect asymmetric edema or infection. Electronically Signed   By: Logan Bores M.D.   On: 01/31/2017 13:19   Assessment/Plan Acute on chronic pancreatitis Alcohol use disorder Pancreatic insufficiency  Shortness of breath - workup per primary team, Lasix   Lower GIB Internal and external hemorrhoids  Normocytic anemia - receiving pRBCs today   Plan: external and internal hemorrhoids present on exam without obvious source of bleeding. Recommend GI consult for repeat colonoscopy to rule out malignancy. If internal hemorrhoid is causing LGIB, GI may be able to perform band ligation during colonoscopy procedure. General surgery will follow.   Jill Alexanders, [facility] 02/01/2017, 2:56 PM Pager: [phone] Consults: [phone] Mon-Fri 7:00 am-4:30 pm Sat-Sun 7:00 am-11:30 am

## 2017-02-01 NOTE — Progress Notes (Signed)
PT Cancellation Note  Patient Details Name: Sharon MealingVictoria Tsang MRN: [id number] DOB: 12/24/1960   Cancelled Treatment:    Reason Eval/Treat Not Completed: Fatigue/lethargy limiting ability to participate;  Patient reports still waiting on transfusion and feeling too weak to participate in PT and continued to refuse even with encouragement.  Agreed to participate tomorrow after transfusion.   Elray McgregorCynthia Wynn 02/01/2017, 2:51 PM  Sheran Lawlessyndi Wynn, [place] [phone] 02/01/2017

## 2017-02-02 ENCOUNTER — Encounter (HOSPITAL_COMMUNITY)
Admission: EM | Disposition: A | Discharge status: Skilled Nursing Facility | Payer: Self-pay | Source: Home / Self Care | Attending: Student in an Organized Health Care Education/Training Program

## 2017-02-02 ENCOUNTER — Inpatient Hospital Stay (HOSPITAL_COMMUNITY): Payer: Managed Care, Other (non HMO) | Admitting: Certified Registered Nurse Anesthetist

## 2017-02-02 ENCOUNTER — Encounter (HOSPITAL_COMMUNITY): Payer: Self-pay | Admitting: *Deleted

## 2017-02-02 DIAGNOSIS — R0602 Shortness of breath: Secondary | ICD-10-CM

## 2017-02-02 DIAGNOSIS — K601 Chronic anal fissure: Secondary | ICD-10-CM

## 2017-02-02 DIAGNOSIS — K645 Perianal venous thrombosis: Secondary | ICD-10-CM

## 2017-02-02 DIAGNOSIS — K602 Anal fissure, unspecified: Secondary | ICD-10-CM

## 2017-02-02 DIAGNOSIS — K6289 Other specified diseases of anus and rectum: Secondary | ICD-10-CM

## 2017-02-02 HISTORY — PX: COLONOSCOPY: SHX5424

## 2017-02-02 LAB — CBC
HCT: 30.8 % — ABNORMAL LOW (ref 36.0–46.0)
Hemoglobin: 10.4 g/dL — ABNORMAL LOW (ref 12.0–15.0)
MCH: 32.8 pg (ref 26.0–34.0)
MCHC: 33.8 g/dL (ref 30.0–36.0)
MCV: 97.2 fL (ref 78.0–100.0)
PLATELETS: 231 10*3/uL (ref 150–400)
RBC: 3.17 MIL/uL — AB (ref 3.87–5.11)
RDW: 16.4 % — AB (ref 11.5–15.5)
WBC: 9.6 10*3/uL (ref 4.0–10.5)

## 2017-02-02 LAB — TYPE AND SCREEN
ABO/RH(D): A NEG
Antibody Screen: NEGATIVE
Unit division: 0

## 2017-02-02 LAB — BASIC METABOLIC PANEL
ANION GAP: 10 (ref 5–15)
BUN: <5 mg/dL — ABNORMAL LOW (ref 6–20)
CALCIUM: 9.1 mg/dL (ref 8.9–10.3)
CO2: 17 mmol/L — AB (ref 22–32)
CREATININE: 0.96 mg/dL (ref 0.44–1.00)
Chloride: 113 mmol/L — ABNORMAL HIGH (ref 101–111)
Glucose, Bld: 95 mg/dL (ref 65–99)
Potassium: 4.3 mmol/L (ref 3.5–5.1)
SODIUM: 140 mmol/L (ref 135–145)

## 2017-02-02 LAB — PROCALCITONIN: PROCALCITONIN: 0.6 ng/mL

## 2017-02-02 LAB — BPAM RBC
BLOOD PRODUCT EXPIRATION DATE: 201810302359
ISSUE DATE / TIME: 201810251524
Unit Type and Rh: 600

## 2017-02-02 LAB — GLUCOSE, CAPILLARY: Glucose-Capillary: 86 mg/dL (ref 65–99)

## 2017-02-02 SURGERY — COLONOSCOPY
Anesthesia: Monitor Anesthesia Care

## 2017-02-02 MED ORDER — FUROSEMIDE 20 MG PO TABS: 20.0000 mg | ORAL_TABLET | Freq: Two times a day (BID) | ORAL | Status: DC

## 2017-02-02 MED ORDER — NON FORMULARY: 1.0000 "application " | Freq: Three times a day (TID) | Status: DC

## 2017-02-02 MED ORDER — TRAMADOL HCL 50 MG PO TABS
50.0000 mg | ORAL_TABLET | Freq: Two times a day (BID) | ORAL | Status: DC | PRN
  Administered 2017-02-02 – 2017-02-03 (×2): 50 mg via ORAL
  Filled 2017-02-02 (×2): qty 1

## 2017-02-02 MED ORDER — FUROSEMIDE 20 MG PO TABS
20.0000 mg | ORAL_TABLET | Freq: Two times a day (BID) | ORAL | Status: DC
  Administered 2017-02-02 – 2017-02-03 (×2): 20 mg via ORAL
  Filled 2017-02-02 (×2): qty 1

## 2017-02-02 MED ORDER — LACTATED RINGERS IV SOLN
INTRAVENOUS | Status: DC | PRN
  Administered 2017-02-02: 11:00:00 via INTRAVENOUS

## 2017-02-02 MED ORDER — NONFORMULARY OR COMPOUNDED ITEM
1.0000 "application " | Freq: Three times a day (TID) | Status: DC
  Administered 2017-02-02: 1 via TOPICAL
  Filled 2017-02-02 (×26): qty 1

## 2017-02-02 MED ORDER — PROPOFOL 10 MG/ML IV BOLUS
INTRAVENOUS | Status: DC | PRN
  Administered 2017-02-02: 20 mg via INTRAVENOUS

## 2017-02-02 MED ORDER — PROPOFOL 500 MG/50ML IV EMUL
INTRAVENOUS | Status: DC | PRN
  Administered 2017-02-02: 75 ug/kg/min via INTRAVENOUS

## 2017-02-02 MED ORDER — PHENYLEPHRINE HCL 10 MG/ML IJ SOLN
INTRAMUSCULAR | Status: DC | PRN
  Administered 2017-02-02 (×3): 80 ug via INTRAVENOUS

## 2017-02-02 MED ORDER — DILTIAZEM GEL 2 %
Freq: Three times a day (TID) | CUTANEOUS | Status: DC
  Filled 2017-02-02: qty 30

## 2017-02-02 MED ORDER — LIDOCAINE HCL (CARDIAC) 20 MG/ML IV SOLN
INTRAVENOUS | Status: DC | PRN
  Administered 2017-02-02: 60 mg via INTRATRACHEAL

## 2017-02-02 NOTE — Progress Notes (Signed)
[facility] Progress Note      Assessment/Plan Acute on chronic pancreatitis Alcohol use disorder Pancreatic insufficiency  Shortness of breath - workup per primary team, Lasix   Lower GIB Internal and external hemorrhoids  Normocytic anemia - received pRBCs 10/25 - Hg 10.4 today  Plan: external and internal hemorrhoids. Colonoscopy pending. General surgery will follow.     LOS: 8 days    Subjective:  CC: rectal bleeding  Pt feels like she needs to have a BM but is afraid to cause it will be painful. No abdominal pain, nausea or vomiting overnight. No rectal bleeding overnight that she is aware of.   Objective: Vital signs in last 24 hours: Temp:  [97.3 F (36.3 C)-97.8 F (36.6 C)] 97.6 F (36.4 C) (10/26 0502) Pulse Rate:  [81-100] 91 (10/26 0502) Resp:  [14-18] 16 (10/26 0502) BP: (89-105)/(55-90) 101/63 (10/26 0502) SpO2:  [90 %-97 %] 94 % (10/26 0502) Last BM Date: 02/01/17  Intake/Output from previous day: 10/25 0701 - 10/26 0700 In: 1051.7 [P.O.:680; I.V.:10; Blood:361.7] Out: -  Intake/Output this shift: No intake/output data recorded.  PE: Gen:  Alert, NAD, pleasant, cooperative, appears older than stated age Card:  Normal S1 and S2, no M/G/R heard Pulm:  Rate and effort normal Abd: Soft, NT/ND, +BS, previous midline scar well healed, large reducible nontender ventral hernia just left of umbilicus Skin: warm and dry   Anti-infectives: Anti-infectives    Start     Dose/Rate Route Frequency Ordered Stop   01/31/17 1530  azithromycin (ZITHROMAX) tablet 500 mg     500 mg Oral Every 24 hours 01/31/17 1446     01/31/17 1530  cefTRIAXone (ROCEPHIN) 1 g in dextrose 5 % 50 mL IVPB  Status:  Discontinued     1 g 100 mL/hr over 30 Minutes Intravenous Every 24 hours 01/31/17 1446 02/01/17 1321      Lab Results:   Recent Labs  02/01/17 1952 02/02/17 0522  WBC 10.7* 9.6  HGB 9.8* 10.4*  HCT 29.3* 30.8*  PLT 217 231    BMET  Recent Labs  02/01/17 0546 02/02/17 0522  NA 138 140  K 4.4 4.3  CL 113* 113*  CO2 16* 17*  GLUCOSE 105* 95  BUN <5* <5*  CREATININE 0.84 0.96  CALCIUM 8.5* 9.1   PT/INR No results for input(s): LABPROT, INR in the last 72 hours. CMP     Component Value Date/Time   NA 140 02/02/2017 0522   NA 136 12/07/2016 1200   K 4.3 02/02/2017 0522   CL 113 (H) 02/02/2017 0522   CO2 17 (L) 02/02/2017 0522   GLUCOSE 95 02/02/2017 0522   BUN <5 (L) 02/02/2017 0522   BUN 2 (L) 12/07/2016 1200   CREATININE 0.96 02/02/2017 0522   CALCIUM 9.1 02/02/2017 0522   PROT 4.9 (L) 01/27/2017 0602   PROT 6.2 12/07/2016 1200   ALBUMIN 2.0 (L) 01/27/2017 0602   ALBUMIN 2.7 (L) 12/07/2016 1200   AST 77 (H) 01/27/2017 0602   ALT 20 01/27/2017 0602   ALKPHOS 213 (H) 01/27/2017 0602   BILITOT 2.0 (H) 01/27/2017 0602   BILITOT 2.2 (H) 12/07/2016 1200   GFRNONAA >60 02/02/2017 0522   GFRAA >60 02/02/2017 0522   Lipase     Component Value Date/Time   LIPASE 47 01/25/2017 1519    Studies/Results: Dg Chest 2 View  Result Date: 01/31/2017 CLINICAL DATA:  Shortness of breath. EXAM: CHEST  2 VIEW COMPARISON:  Chest CT 12/18/2016  and radiograph 04/23/2016 FINDINGS: The cardiomediastinal silhouette is unchanged and within normal limits. Aortic atherosclerosis is noted. There is extensive interstitial opacity throughout the left lung with milder interstitial opacity in the right mid and lower lung. The left lung opacities are stable to mildly increased compared to the January radiograph with the right lung opacities being improved, however these had all cleared on the more recent CT. Underlying emphysema is noted. No sizable pleural effusion or pneumothorax is identified. An IVC filter is partially visualized in the abdomen. IMPRESSION: Recurrent extensive left and mild right interstitial lung opacities which may reflect asymmetric edema or infection. Electronically Signed   By: Sebastian Ache M.D.    On: 01/31/2017 13:19      Jerre Simon , [facility] 02/02/2017, 8:13 AM Pager: [phone] Consults: [phone] Mon-Fri 7:00 am-4:30 pm Sat-Sun 7:00 am-11:30 am

## 2017-02-02 NOTE — Interval H&P Note (Signed)
History and Physical Interval Note:  02/02/2017 10:46 AM  Sharon MealingVictoria Mullally  has presented today for surgery, with the diagnosis of hematochezia, anemia  The various methods of treatment have been discussed with the patient and family. After consideration of risks, benefits and other options for treatment, the patient has consented to  Procedure(s): COLONOSCOPY (N/A) as a surgical intervention .  The patient's history has been reviewed, patient examined, no change in status, stable for surgery.  I have reviewed the patient's chart and labs.  Questions were answered to the patient's satisfaction.     Stan Headarl Gessner

## 2017-02-02 NOTE — Progress Notes (Signed)
Internal Medicine Attending:   I saw and examined the patient. I reviewed the resident's note and I agree with the resident's findings and plan as documented in the resident's note.  Patient is doing well post-colonoscopy. She was eating a lunch tray when I saw her. The symptomatic anemia is much improved after the blood transfusion. Colonoscopy revealed thrombosed hemorrhoids and an anal fissure, which is causing her rectal pain. I agree with starting topical diltiazem and lidocaine. We will monitor for further hemorrhoidal bleeding, but perhaps that can be followed up as an outpatient with surgery? Plan to monitor her overnight for further bleeding or anemia, and then I am hopeful for discharge to rehab tomorrow to work on her deconditioning.

## 2017-02-02 NOTE — Anesthesia Preprocedure Evaluation (Signed)
Anesthesia Evaluation  Patient identified by MRN, date of birth, ID band Patient awake    Reviewed: Allergy & Precautions, NPO status , Patient's Chart, lab work & pertinent test results  History of Anesthesia Complications Negative for: history of anesthetic complications  Airway Mallampati: II  TM Distance: >3 FB Neck ROM: Full    Dental no notable dental hx. (+) Dental Advisory Given   Pulmonary neg pulmonary ROS, asthma , Current Smoker,    Pulmonary exam normal        Cardiovascular hypertension, Normal cardiovascular exam     Neuro/Psych negative neurological ROS  negative psych ROS   GI/Hepatic Neg liver ROS, GERD  ,  Endo/Other  negative endocrine ROS  Renal/GU negative Renal ROS  negative genitourinary   Musculoskeletal negative musculoskeletal ROS (+)   Abdominal   Peds negative pediatric ROS (+)  Hematology negative hematology ROS (+)   Anesthesia Other Findings   Reproductive/Obstetrics negative OB ROS                             Anesthesia Physical Anesthesia Plan  ASA: III  Anesthesia Plan: MAC   Post-op Pain Management:    Induction:   PONV Risk Score and Plan: 2 and Ondansetron and Propofol infusion  Airway Management Planned: Simple Face Mask  Additional Equipment:   Intra-op Plan:   Post-operative Plan:   Informed Consent: I have reviewed the patients History and Physical, chart, labs and discussed the procedure including the risks, benefits and alternatives for the proposed anesthesia with the patient or authorized representative who has indicated his/her understanding and acceptance.   Dental advisory given  Plan Discussed with: CRNA and Anesthesiologist  Anesthesia Plan Comments:         Anesthesia Quick Evaluation

## 2017-02-02 NOTE — Clinical Social Work Note (Signed)
Per MD, patient is not discharging to SNF today. CSW notified SNF hospital liaison. Admissions coordinator will [facility]  Charlynn CourtSarah Aarilyn Dye, CSW [phone]

## 2017-02-02 NOTE — Anesthesia Postprocedure Evaluation (Signed)
Anesthesia Post Note  Patient: Sharon Kent  Procedure(s) Performed: COLONOSCOPY (N/A )     Patient location during evaluation: PACU Anesthesia Type: MAC Level of consciousness: awake and alert Pain management: pain level controlled Vital Signs Assessment: post-procedure vital signs reviewed and stable Respiratory status: spontaneous breathing, nonlabored ventilation, respiratory function stable and patient connected to nasal cannula oxygen Cardiovascular status: stable and blood pressure returned to baseline Postop Assessment: no apparent nausea or vomiting Anesthetic complications: no    Last Vitals:  Vitals:   02/02/17 1153 02/02/17 1200  BP:  95/67  Pulse: 86 89  Resp: 19 (!) 22  Temp: 36.7 C   SpO2: 92% 94%    Last Pain:  Vitals:   02/02/17 1153  TempSrc: Axillary  PainSc:                  Tiajuana Amass

## 2017-02-02 NOTE — Transfer of Care (Signed)
Immediate Anesthesia Transfer of Care Note  Patient: Sharon Kent  Procedure(s) Performed: COLONOSCOPY (N/A )  Patient Location: PACU  Anesthesia Type:MAC  Level of Consciousness: awake and alert   Airway & Oxygen Therapy: Patient Spontanous Breathing  Post-op Assessment: Report given to RN and Post -op Vital signs reviewed and stable  Post vital signs: Reviewed and stable  Last Vitals:  Vitals:   02/02/17 1023 02/02/17 1028  BP: 100/66   Pulse: 95   Resp: 19   Temp: 36.6 C   SpO2: (!) 88% 93%    Last Pain:  Vitals:   02/02/17 1023  TempSrc: Oral  PainSc:       Patients Stated Pain Goal: 1 (89/21/19 4174)  Complications: No apparent anesthesia complications

## 2017-02-02 NOTE — Care Management Note (Signed)
Case Management Note  Patient Details  Name: Earnestine MealingVictoria Curlin MRN: [id number] Date of Birth: December 23, 1960  Subjective/Objective:                 [facility] [employer]    Action/Plan:   Expected Discharge Date:                  Expected Discharge Plan:  Skilled Nursing Facility  In-House Referral:  Clinical Social Work  Discharge planning Services  CM Consult  Post Acute Care Choice:    Choice offered to:     DME Arranged:    DME Agency:     HH Arranged:    HH Agency:     Status of Service:  Completed, signed off  If discussed at [employer] Meetings, dates discussed:    Additional Comments:  Lawerance SabalDebbie Gethsemane Fischler, RN 02/02/2017, 3:58 PM

## 2017-02-02 NOTE — Progress Notes (Signed)
Physical Therapy Treatment Patient Details Name: Sharon Kent MRN: [id number] DOB: 01-27-1961 Today's Date: 02/02/2017    History of Present Illness Sharon Kent is a 56 yo with a PMH of chronic pancreatitis 2/2 alcohol use and pancreas divisum, GERD, asthma, and tobacco use who is presenting was acute worsening of chronic abdominal pain and nausea/vomiting.    PT Comments    Progressing over previous treatments with ambulation in room today with encouragement.  Patient seemed thankful to have walked even though needing encouragement to participate.  Continued skilled PT in the acute setting and plan for SNF level rehab at d/c.   Follow Up Recommendations  SNF     Equipment Recommendations  Rolling walker with 5" wheels    Recommendations for Other Services       Precautions / Restrictions Precautions Precautions: Fall Required Braces or Orthoses: Other Brace/Splint Other Brace/Splint: bilat cam boots when weight bearing (pt declined them today) Restrictions Weight Bearing Restrictions: No    Mobility  Bed Mobility Overal bed mobility: Needs Assistance Bed Mobility: Supine to Sit     Supine to sit: Supervision Sit to supine: Mod assist   General bed mobility comments: support for each leg as she put it in the bed  Transfers Overall transfer level: Needs assistance Equipment used: Rolling walker (2 wheeled) Transfers: Sit to/from [employer];Stand Pivot Transfers Sit to Stand: Mod assist Stand pivot transfers: Min assist       General transfer comment: lifting help to get pt to stand from EOB, better [facility] with [facility]; stand pivot with RW and help for safety  Ambulation/Gait Ambulation/Gait assistance: Min assist Ambulation Distance (Feet): 15 Feet Assistive device: Rolling walker (2 wheeled) Gait Pattern/deviations: Step-to pattern;Step-through pattern;Decreased stride length     General Gait Details: assist for balance, cues for encouragement for  increased distance, on 3L O2 throughout with dyspnea 2/4   Stairs            Wheelchair Mobility    Modified Rankin (Stroke Patients Only)       Balance Overall balance assessment: Needs assistance Sitting-balance support: No upper extremity supported;Feet supported Sitting balance-Leahy Scale: Fair     Standing balance support: Bilateral upper extremity supported Standing balance-Leahy Scale: Poor Standing balance comment: UE support and S for balance                            Cognition Arousal/Alertness: Awake/alert Behavior During Therapy: Flat affect Overall Cognitive Status: No family/caregiver present to determine baseline cognitive functioning                                        Exercises      General Comments        Pertinent Vitals/Pain Pain Score: 9  Pain Location: both legs and hemorrhoids    Home Living                      Prior Function            PT Goals (current goals can now be found in the care plan section) Progress towards PT goals: Progressing toward goals    Frequency    Min 3X/week      PT Plan Current plan remains appropriate    Co-evaluation  AM-PAC PT "6 Clicks" Daily Activity  Outcome Measure  Difficulty turning over in bed (including adjusting bedclothes, sheets and blankets)?: None Difficulty moving from lying on back to sitting on the side of the bed? : Unable Difficulty sitting down on and standing up from a chair with arms (e.g., wheelchair, bedside commode, etc,.)?: Unable Help needed moving to and from a bed to chair (including a wheelchair)?: A Lot Help needed walking in hospital room?: A Little Help needed climbing 3-5 steps with a railing? : Total 6 Click Score: 12    End of Session Equipment Utilized During Treatment: Oxygen Activity Tolerance: Patient limited by fatigue Patient left: in bed;with call bell/phone within reach   PT Visit  Diagnosis: Unsteadiness on feet (R26.81);Muscle weakness (generalized) (M62.81);Other symptoms and signs involving the nervous system (Z61.096)     Time: 0454-0981 PT Time Calculation (min) (ACUTE ONLY): 21 min  Charges:  $Gait Training: 8-22 mins                    G CodesSheran Lawless, [place] [phone] 02/02/2017    Elray Mcgregor 02/02/2017, 4:36 PM

## 2017-02-02 NOTE — Progress Notes (Signed)
Subjective:  Patient was seen sitting up in bed this AM in no acute distress. She states that she had several bowel movements with colonoscopy prep overnight. She states that she feels much better after the transfusion yesterday. She also thinks her breathing has improved. She still endorses overall weakness, but is better than on previous days.   Objective:  Vital signs in last 24 hours: Vitals:   02/02/17 1028 02/02/17 1153 02/02/17 1200 02/02/17 1344  BP:   95/67 107/72  Pulse:  86 89 85  Resp:  19 (!) 22 18  Temp:  98 F (36.7 C)  97.7 F (36.5 C)  TempSrc:  Axillary  Oral  SpO2: 93% 92% 94% 93%  Weight:      Height:       Physical Exam  Constitutional:  Thin, chronically ill appearing woman sitting up in bed in no acute distress  Cardiovascular: Normal rate, regular rhythm and intact distal pulses.  Exam reveals no friction rub.   No murmur heard. Pulmonary/Chest: Effort normal. No respiratory distress. She has no wheezes. She has no rales.  Abdominal: Soft. She exhibits no distension. There is tenderness (eipgastric region). There is no guarding.  Stable, easily reducible ventral hernia in mid abdomen that is non tender with palpation.   Musculoskeletal: She exhibits edema (1+ pitting edema to mid shins bilaterally, unchanged from previous exams). She exhibits no tenderness (of bilateral lower extremities).  Skin: Skin is warm and dry. Capillary refill takes less than 2 seconds. No rash noted. No erythema.   Assessment/Plan:  Principal Problem:   Acute on chronic pancreatitis (HCC) Active Problems:   Alcohol use disorder, mild, abuse   Hypokalemia   Pancreatic insufficiency   Hyponatremia   Rectal pain   Chronic anterior anal fissure   External thrombosed hemorrhoids  Sharon Kent is a 56 yo with a PMH of chronic pancreatitis 2/2 alcohol use and pancreas divisum, GERD, asthma, and tobacco use who presented for acute worsening of chronic abdominal pain and  nausea/vomiting. Her initial workup in the ED was consistent with acute pancreatitis. She was admitted to the internal medicine teaching service for management. The specific problems addressed during admission are as follows:  SOB 2/2 volume overload vs pneumonia:Patient clinically improving with diuresis and treatment of community acquired pneumonia. Procalcitonin = 0.6 today. Since patient clinically improving, will continue to monitor and defer change in antibiotic therapy (possible Strep pneumo vs pseudomonal coverage) if patient continues to have O2 requirement in spite of diuresis.  -Continue azithromycin 500 mg daily (day 3 of 5) -Daily lasix 20 mg  -Strict I&O, daily weights -Wean oxygen as tolerated  Acute on chronic anemia 2/2 hemorrhoidal bleeding, resolving: Patient's CBC this AM showed Hgb back to baseline of 10 after transfusion of 1 unit pRBCs yesterday. Continuing GI recommendations and supportive care of hemorrhoids with colonoscopy today -GI consulted, appreciate recommendations after colonoscopy today  Hypokalemia and hyponatremia, chronic with metabolic acidosis: AM labs today with Na of 140, K of 4.3, and bicarb of 17, which is improved from yesterday's acidosis. Will continue with bicarb replacement and with electrolyte monitoring  -K-Dur40 mEq BID and Sodium Bicarbonate 650 mg TID -BMP in AM  Acute on chronic pancreatitis:  -Continue Creon, 72,000 units TID with meals -Continue home Tramadol 50 mg daily for pain  Bilateral ankle sprains (grade 3 R and grade 2-3 on L) s/p unwitnessed fall on 10/21:  -Orthopedic surgery will follow up as outpatient in 1-2 weeks  Hx of  alcohol use:  -CIWA while inpatient -Daily multivitamin  Diet:HH fluid restricted, 2L VTE prophylaxis: Lovenox Code:Full  Dispo: Anticipated discharge in approximately 1-2 day(s).   Rozann LeschesNedrud, Sharon Poffenberger, MD 02/02/2017, 2:28 PM Pager: [phone]

## 2017-02-02 NOTE — Op Note (Addendum)
[facility] Patient Name: Sharon Kent Procedure Date : 02/02/2017 MRN: [id number] Attending MD: Iva Boop , MD Date of Birth: 05-25-1960 CSN: [id number] Age: 56 Admit Type: Inpatient Procedure:                Colonoscopy Indications:              Rectal bleeding Providers:                Iva Boop, MD, Dwain Sarna, RN, Oletha Blend, Technician Referring MD:              Medicines:                Propofol per Anesthesia, Monitored Anesthesia Care Complications:            No immediate complications. Estimated Blood Loss:     Estimated blood loss: none. Procedure:                Pre-Anesthesia Assessment:                           - Prior to the procedure, a History and Physical                            was performed, and patient medications and                            allergies were reviewed. The patient's tolerance of                            previous anesthesia was also reviewed. The risks                            and benefits of the procedure and the sedation                            options and risks were discussed with the patient.                            All questions were answered, and informed consent                            was obtained. Prior Anticoagulants: The patient has                            taken no previous anticoagulant or antiplatelet                            agents. ASA Grade Assessment: III - A patient with                            severe systemic disease. After reviewing the risks  and benefits, the patient was deemed in                            satisfactory condition to undergo the procedure.                           After obtaining informed consent, the colonoscope                            was passed under direct vision. Throughout the                            procedure, the patient's blood pressure, pulse, and   oxygen saturations were monitored continuously. The                            EC-[id number] ([id number]) scope was introduced through                            the anus and advanced to the the cecum, identified                            by appendiceal orifice and ileocecal valve. The                            colonoscopy was somewhat difficult due to                            significant looping. Successful completion of the                            procedure was aided by applying abdominal pressure.                            The quality of the bowel preparation was excellent.                            The ileocecal valve, appendiceal orifice, and                            rectum were photographed. The patient tolerated the                            procedure well. Scope In: 11:30:21 AM Scope Out: 11:42:18 AM Scope Withdrawal Time: 0 hours 6 minutes 49 seconds  Total Procedure Duration: 0 hours 11 minutes 57 seconds  Findings:      The perianal exam findings include thrombosed external hemorrhoids.      The digital rectal exam findings include rectal tenderness.      A anal fissure was found in the anal canal.      External hemorrhoids were found during retroflexion and during perianal       exam.      The exam was otherwise without abnormality on direct and retroflexion       views. Impression:               -  Thrombosed external hemorrhoids found on perianal                            exam.                           - Rectal tenderness found on digital rectal exam.                           - Anal fissure.                           - External hemorrhoids.                           - The examination was otherwise normal on direct                            and retroflexion views.                           - No specimens collected. Moderate Sedation:      Please see anesthesia notes, moderate sedation not given Recommendation:           - Resume previous diet.                            - Continue present medications.                           - Repeat colonoscopy in 10 years for screening                            purposes.                           - Return patient to hospital ward for ongoing care.                           - Diltiazem 2% topical and 5% lidocaine to                            ano-rectum ordered Procedure Code(s):        --- Professional ---                           [phone], Colonoscopy, flexible; diagnostic, including                            collection of specimen(s) by brushing or washing,                            when performed (separate procedure) Diagnosis Code(s):        --- Professional ---                           K62.89, Other specified diseases of anus and rectum  K60.2, Anal fissure, unspecified                           K64.5, Perianal venous thrombosis                           K62.5, Hemorrhage of anus and rectum CPT copyright 2016 [employer]. All rights reserved. The codes documented in this report are preliminary and upon coder review may  be revised to meet current compliance requirements. Iva Boop, MD 02/02/2017 11:52:37 AM This report has been signed electronically. Number of Addenda: 0

## 2017-02-02 NOTE — Progress Notes (Signed)
Pt scheduled for colonoscopy at 1045 today,had bowel prep, but could not finish drinking all , still have about 800 ml  In the bottle this morning, she stated she cant take it anymore, she was NPO at 0000 , nurse informed on call MD. and she said its okay for the pt to continue only the bowel prep though NPO.  Pt had 5  Bowel stools, stable,and oriented X4, will continue to monitor.

## 2017-02-02 NOTE — H&P (View-Only) (Signed)
   Consultation  Referring Provider:  Dr. Vincent     Primary Care Physician:  Hoffman, Erik C, DO Primary Gastroenterologist: [facility] (Unassigned)        Reason for Consultation: Rectal Bleeding, Anemia          HPI:   Sharon Kent is a 56 y.o. female with past medical history of GERD, chronic pancreatitis and ETOH abuse, as well as others listed below who presented to the ER on 01/25/17 with increasing nausea, vomiting and oral intolerance. She had persistent epigastric and LUQ abdominal pain. She was diagnosed with acute on chronic pancreatitis and has been receiving treatment for this. This has been improving. We are consulted now for rectal bleeding and anemia.    Patient had exam by hospitalist with multiple thrombosed external hemorrhoids and patient expressed that it hurt too much to even sit in the chair. Also described passing blood with every BM.   At time of my interview today, the patient is found dozing in and out of sleep in her bed. She explains that this "rectal bleeding" has been going on for 2 weeks, "off and on", but over the past 2 days this has been worse and with every BM which have been more frequent during the past few days, up to 2-3 times per day.  Patient is a poor historian and is unable to fully describe exactly what has been going on, but does tell me that her last colonoscopy was at [place] 5 [place] and she was told to repeat in 10 years. Patient expresses a large amount of rectal pain currently, even just laying on the bed, so much so that "I don't want anyone else to look at it".  She has been receiving Hydrocortisone cream as ordered by internal med team. Patient denies episodes of bleeding w/o a BM.   Generally, the patient remains somewhat weak and is still requiring O2 which she does not require at home.   Patient denies fever, chills, weight loss, heartburn, reflux or continued abdominal pain (other than chronic epigastric  pain).  Previous Gi History: 12/21/16-Dr. Obando, [facility]: Follow up for chronic pancreatitis, previous diagnosis of pancreas divisum, stents placed from March-July of this year, complained of bloating and upper abdominal pain at that time, constipation-described a colo 5 years ago in [place] [facility] and was apparently told to follow up in 10 yrs, continued drinking 1-2 drinks per week, MRI was ordered at that time (see care everywhere for further information)  Past Medical History:  Diagnosis Date  . Asthma   . GERD (gastroesophageal reflux disease)   . H/O chronic pancreatitis   . H/O ETOH abuse   . Headache    "monthly" (06/15/2016)  . History of blood transfusion 2000s   "when they explored my abdomen"  . Hx of pulmonary embolus   . Necrotizing pancreatitis 2015  . 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 Pancreatic abscess   . Pneumonia    "twice at least" (06/15/2016)    Past Surgical History:  Procedure Laterality Date  . COLONOSCOPY    . ERCP  2018   multiple  . EXPLORATORY LAPAROTOMY  2000s   "took out all my organs and washed the dead tissue"  . IVC FILTER PLACEMENT ([facility] HX)  12/2010?  . LAPAROSCOPIC CHOLECYSTECTOMY  2000s    Family History  Problem Relation Age of Onset  . Adopted: Yes  . Family history unknown: Yes       Social History  Substance Use Topics  . Smoking status: Current Some Day Smoker    Packs/day: 0.50    Years: 31.00    Types: Cigarettes  . Smokeless tobacco: Never Used     Comment: DOWN TO 2 CIGARETTES A DAY  . Alcohol use Yes     Comment: yesterday -- 2 drinks    Prior to Admission medications   Medication Sig Start Date End Date Taking? Authorizing Provider  Albuterol Sulfate 108 (90 Base) MCG/ACT AEPB Inhale 2 puffs into the lungs as needed. Patient taking differently: Inhale 2 puffs into the lungs every 4 (four) hours as needed (for shortness of breath).  06/05/16  Yes Burns, Alexa R, MD  Multiple Vitamins-Minerals (MULTIVITAMIN WITH  MINERALS) tablet Take 1 tablet by mouth daily.   Yes [provider]  Pancrelipase, Lip-Prot-Amyl, 24000-76000 units CPEP Take 2 capsules (48,000 Units total) by mouth 3 (three) times daily with meals. 08/28/16  Yes Rice, Christopher W, MD  pantoprazole (PROTONIX) 40 MG tablet Take 1 tablet (40 mg total) by mouth daily. 09/07/16  Yes Hoffman, Erik C, DO  potassium chloride SA (K-DUR,KLOR-CON) 20 MEQ tablet Take 2 tablets (40 mEq total) by mouth daily. Patient taking differently: Take 20 mEq by mouth daily.  07/21/16  Yes Rivet, Carly J, MD  traMADol (ULTRAM) 50 MG tablet Take 1 tablet (50 mg total) by mouth daily. Patient taking differently: Take 50 mg by mouth as needed for moderate pain.  01/10/17  Yes Saraiya, Parth, MD  phenylephrine (,USE FOR PREPARATION-H,) 0.25 % suppository Place 1 suppository rectally 2 (two) times daily. Patient not taking: Reported on 01/25/2017 01/10/17   Saraiya, Parth, MD  witch hazel-glycerin (TUCKS) pad Apply 1 application topically as needed for itching. Patient not taking: Reported on 01/19/2017 01/10/17   Saraiya, Parth, MD    Current Facility-Administered Medications  Medication Dose Route Frequency Provider Last Rate Last Dose  . azithromycin (ZITHROMAX) tablet 500 mg  500 mg Oral Q24H Nedrud, Marybeth, MD   500 mg at 02/01/17 1643  . feeding supplement (BOOST / RESOURCE BREEZE) liquid 1 Container  1 Container Oral BID BM Vincent, Duncan Thomas, MD   1 Container at 02/01/17 1056  . folic acid (FOLVITE) tablet 1 mg  1 mg Oral Daily Molt, Bethany, DO   1 mg at 02/01/17 0931  . furosemide (LASIX) injection 20 mg  20 mg Intravenous BID Nedrud, Marybeth, MD   20 mg at 02/01/17 0931  . hydrocortisone (ANUSOL-HC) 2.5 % rectal cream   Rectal QID Lacroce, Samantha J, MD      . ibuprofen (ADVIL,MOTRIN) tablet 600 mg  600 mg Oral Q6H PRN Nedrud, Marybeth, MD   600 mg at 02/01/17 0359  . Influenza vac split quadrivalent PF (FLUARIX) injection 0.5 mL  0.5 mL  Intramuscular Tomorrow-1000 Vincent, Duncan Thomas, MD      . lidocaine (LMX) 4 % cream   Topical Q4H PRN Nedrud, Marybeth, MD      . lipase/protease/amylase (CREON) capsule 72,000 Units  72,000 Units Oral TID WC Nedrud, Marybeth, MD   72,000 Units at 02/01/17 1643  . multivitamin with minerals tablet 1 tablet  1 tablet Oral Daily Molt, Bethany, DO   1 tablet at 02/01/17 0931  . ondansetron (ZOFRAN) injection 4 mg  4 mg Intravenous Q6H PRN Nedrud, Marybeth, MD   4 mg at 01/30/17 0419  . pantoprazole (PROTONIX) EC tablet 40 mg  40 mg Oral Daily Molt, Bethany, DO   40 mg at   02/01/17 0931  . peg 3350 powder (MOVIPREP) kit 100 g  0.5 kit Oral Once Patton, Karen K, RPH      . potassium chloride SA (K-DUR,KLOR-CON) CR tablet 40 mEq  40 mEq Oral BID Nedrud, Marybeth, MD   40 mEq at 02/01/17 0931  . sodium bicarbonate tablet 650 mg  650 mg Oral TID Nedrud, Marybeth, MD   650 mg at 02/01/17 1643  . thiamine (VITAMIN B-1) tablet 100 mg  100 mg Oral Daily Molt, Bethany, DO   100 mg at 02/01/17 0931  . traMADol (ULTRAM) tablet 50 mg  50 mg Oral Daily PRN Nedrud, Marybeth, MD   50 mg at 02/01/17 1245  . witch hazel-glycerin (TUCKS) pad   Topical PRN Hoffman, Jessica Ratliff, DO        Allergies as of 01/25/2017 - Review Complete 01/25/2017  Allergen Reaction Noted  . Morphine Nausea Only 11/23/2015  . Oxycodone-acetaminophen Other (See Comments) 12/26/2012     Review of Systems:    Constitutional: Positive for weakness Skin: No rash Cardiovascular: No chest pain  Respiratory: Positive for SOB Gastrointestinal: See HPI and otherwise negative Genitourinary: No dysuria  Neurological: No headache Musculoskeletal: No new muscle or joint pain Hematologic: No bruising Psychiatric: No history of depression or anxiety   Physical Exam:  Vital signs in last 24 hours: Temp:  [97.3 F (36.3 C)-97.8 F (36.6 C)] 97.3 F (36.3 C) (10/25 1627) Pulse Rate:  [81-100] 84 (10/25 1556) Resp:  [14-20] 14 (10/25  1556) BP: (89-113)/(55-90) 89/59 (10/25 1627) SpO2:  [87 %-97 %] 90 % (10/25 1556) Weight:  [123 lb 14.4 oz (56.2 kg)] 123 lb 14.4 oz (56.2 kg) (10/25 0500) Last BM Date: 02/01/17 General:   Caucasian female appears to be in NAD, Well developed, Well nourished, alert and cooperative Head:  Normocephalic and atraumatic. Eyes:   PEERL, EOMI. No icterus. Conjunctiva pink. Ears:  Normal auditory acuity. Neck:  Supple Throat: Oral cavity and pharynx without inflammation, swelling or lesion.  Lungs: Respirations even and unlabored. B/l crackles, On O2 via  Heart: Normal S1, S2. No MRG. Regular rate and rhythm. No peripheral edema, cyanosis or pallor.  Abdomen:  Soft, nondistended, nontender. No rebound or guarding. Normal bowel sounds. No appreciable masses or hepatomegaly. Rectal:  Patient declined Msk:  Symmetrical without gross deformities.  Extremities:  1+ pitting edema to mid shins, b/l with tenderness, no deformity or joint abnormality.  Neurologic:  Alert and  oriented x4;  grossly normal neurologically.  Skin:   Dry and intact without significant lesions or rashes. Psychiatric: Demonstrates good judgement and reason without abnormal affect or behaviors.   LAB RESULTS:  Recent Labs  01/31/17 0948 01/31/17 1215 01/31/17 1545 02/01/17 0546  WBC 12.0* DUP SEE W1833  --  8.4  HGB 7.6* DUP SEE W1833 7.9* 7.2*  HCT 22.1* DUP SEE W1833 23.0* 21.1*  PLT 220 DUP SEE W1833  --  205   BMET  Recent Labs  01/30/17 0719 01/31/17 0338 02/01/17 0546  NA 137 139 138  K 3.9 4.1 4.4  CL 115* 118* 113*  CO2 15* 12* 16*  GLUCOSE 110* 123* 105*  BUN <5* <5* <5*  CREATININE 0.60 0.90 0.84  CALCIUM 8.6* 8.4* 8.5*   STUDIES: Dg Chest 2 View  Result Date: 01/31/2017 CLINICAL DATA:  Shortness of breath. EXAM: CHEST  2 VIEW COMPARISON:  Chest CT 12/18/2016 and radiograph 04/23/2016 FINDINGS: The cardiomediastinal silhouette is unchanged and within normal limits. Aortic atherosclerosis  is noted.   There is extensive interstitial opacity throughout the left lung with milder interstitial opacity in the right mid and lower lung. The left lung opacities are stable to mildly increased compared to the January radiograph with the right lung opacities being improved, however these had all cleared on the more recent CT. Underlying emphysema is noted. No sizable pleural effusion or pneumothorax is identified. An IVC filter is partially visualized in the abdomen. IMPRESSION: Recurrent extensive left and mild right interstitial lung opacities which may reflect asymmetric edema or infection. Electronically Signed   By: Allen  Grady M.D.   On: 01/31/2017 13:19   PREVIOUS ENDOSCOPIES:            See HPI-colo 5 yrs ago diff state   Impression / Plan:   Impression: 1. Rectal bleeding/pain: apparently thrombosed ext hemorrhoids on exam (patient would not let me look today), ? Fissure by hx, last colo 5 yrs aso per pt report, must consider other causes of bleeding; Consider diverticular vs polyp vs colitis vs hemorrhoids vs less likely upper gi bleeding 2. Acute on chronic anemia: baseline hgb around 10-11 (thought due to alcohol use), hgb today 7.2, increased bleeding, bright red blood, for 2 days as above, pt now s/p 1 u prbcs 3. Acute on chronic Pancreatitis: improved-patient now tolerating regular diet  Plan: 1. Scheduled patient for colonoscopy tomorrow morning with Dr. Danyela Posas. Did review risks, benefits, limitations and alternatives and the patient agrees to proceed. Patient will be on clears and then full NPO by 5 am. 2. Continue other supportive measures 3. Would recommend Sitz baths 15-20 min BID, Hydrocortisone ointment BID and Recta care/Lidocaine cream to hemorrhoids q4-6 hours  4. Agree with blood transfusion and agree with continued monitoring of hgb with transfusion as needed 5. Please await final recommendations from Dr. Saadiya Wilfong later today  Thank you for your kind consultation, we  will continue to follow.  Aaradhya Kysar  02/01/2017, 6:22 PM Pager #: [phone]    [facility] Attending   I have taken an interval history, reviewed the chart and examined the patient. I agree with the Advanced Practitioner's note, impression and recommendations.   Probably anorectal problems as cause but makes sense to fully evaluate colon. She could need EUA and possible lat int sphx.  The risks and benefits as well as alternatives of endoscopic procedure(s) have been discussed and reviewed. All questions answered. The patient agrees to proceed.  I appreciate the opportunity to care for her. Akiko Schexnider E. Rochanda Harpham, MD, FACG  Gastroenterology [phone] (pager) 02/01/2017 6:22 PM    

## 2017-02-03 ENCOUNTER — Encounter (HOSPITAL_COMMUNITY): Payer: Self-pay | Admitting: Internal Medicine

## 2017-02-03 LAB — CBC
HEMATOCRIT: 23.9 % — AB (ref 36.0–46.0)
HEMOGLOBIN: 8.3 g/dL — AB (ref 12.0–15.0)
MCH: 33.2 pg (ref 26.0–34.0)
MCHC: 34.7 g/dL (ref 30.0–36.0)
MCV: 95.6 fL (ref 78.0–100.0)
Platelets: 216 10*3/uL (ref 150–400)
RBC: 2.5 MIL/uL — ABNORMAL LOW (ref 3.87–5.11)
RDW: 16.6 % — ABNORMAL HIGH (ref 11.5–15.5)
WBC: 7.9 10*3/uL (ref 4.0–10.5)

## 2017-02-03 LAB — BASIC METABOLIC PANEL
ANION GAP: 9 (ref 5–15)
BUN: 5 mg/dL — ABNORMAL LOW (ref 6–20)
CHLORIDE: 112 mmol/L — AB (ref 101–111)
CO2: 15 mmol/L — AB (ref 22–32)
CREATININE: 0.97 mg/dL (ref 0.44–1.00)
Calcium: 8.4 mg/dL — ABNORMAL LOW (ref 8.9–10.3)
GFR calc non Af Amer: >60 mL/min (ref >60)
GLUCOSE: 83 mg/dL (ref 65–99)
Potassium: 3.7 mmol/L (ref 3.5–5.1)
Sodium: 136 mmol/L (ref 135–145)

## 2017-02-03 LAB — GLUCOSE, CAPILLARY: Glucose-Capillary: 93 mg/dL (ref 65–99)

## 2017-02-03 MED ORDER — POTASSIUM CHLORIDE CRYS ER 20 MEQ PO TBCR: 40.0000 meq | EXTENDED_RELEASE_TABLET | Freq: Two times a day (BID) | ORAL | 0 refills | Status: DC

## 2017-02-03 MED ORDER — NONFORMULARY OR COMPOUNDED ITEM: 1.0000 "application " | Freq: Three times a day (TID) | 11 refills | Status: DC

## 2017-02-03 MED ORDER — SODIUM BICARBONATE 650 MG PO TABS: 650.0000 mg | ORAL_TABLET | Freq: Three times a day (TID) | ORAL | 0 refills | Status: DC

## 2017-02-03 MED ORDER — TRAMADOL HCL 50 MG PO TABS: 50.0000 mg | ORAL_TABLET | Freq: Two times a day (BID) | ORAL | 0 refills | Status: DC | PRN

## 2017-02-03 MED ORDER — AZITHROMYCIN 500 MG PO TABS: 500.0000 mg | ORAL_TABLET | ORAL | 0 refills | Status: DC

## 2017-02-03 MED ORDER — HYDROCORTISONE 2.5 % RE CREA: TOPICAL_CREAM | Freq: Four times a day (QID) | RECTAL | 0 refills | Status: AC

## 2017-02-03 MED ORDER — PANCRELIPASE (LIP-PROT-AMYL) 24000-76000 UNITS PO CPEP: 72000.0000 [IU] | ORAL_CAPSULE | Freq: Three times a day (TID) | ORAL | 3 refills | Status: DC

## 2017-02-03 NOTE — Progress Notes (Signed)
Patient ID: Sharon Kent, female   DOB: Mar 25, 1961, 56 y.o.   MRN: [id number] [facility] Surgery Progress Note:   1 Day Post-Op  Subjective: Mental status is alert and answers questions appropriately Objective: Vital signs in last 24 hours: Temp:  [97.7 F (36.5 C)-98 F (36.7 C)] 98 F (36.7 C) (10/27 0557) Pulse Rate:  [78-95] 82 (10/27 0557) Resp:  [16-22] 20 (10/27 0557) BP: (90-107)/(58-72) 103/69 (10/27 0557) SpO2:  [88 %-94 %] 93 % (10/27 0557)  Intake/Output from previous day: 10/26 0701 - 10/27 0700 In: 620 [P.O.:120; I.V.:500] Out: 0  Intake/Output this shift: No intake/output data recorded.  Physical Exam: Work of breathing is normal.    Lab Results:  Results for orders placed or performed during the hospital encounter of 01/25/17 (from the past 48 hour(s))  Prepare RBC     Status: None   Collection Time: 02/01/17 11:31 AM  Result Value Ref Range   Order Confirmation ORDER PROCESSED BY BLOOD BANK   Type and screen [place]     Status: None   Collection Time: 02/01/17 11:46 AM  Result Value Ref Range   ABO/RH(D) A NEG    Antibody Screen NEG    Sample Expiration 02/04/2017    Unit Number [id number]    Blood Component Type RED CELLS,LR    Unit division 00    Status of Unit ISSUED,FINAL    Transfusion Status OK TO TRANSFUSE    Crossmatch Result Compatible   CBC     Status: Abnormal   Collection Time: 02/01/17  7:52 PM  Result Value Ref Range   WBC 10.7 (H) 4.0 - 10.5 K/uL   RBC 3.02 (L) 3.87 - 5.11 MIL/uL   Hemoglobin 9.8 (L) 12.0 - 15.0 g/dL    Comment: POST TRANSFUSION SPECIMEN   HCT 29.3 (L) 36.0 - 46.0 %   MCV 97.0 78.0 - 100.0 fL   MCH 32.5 26.0 - 34.0 pg   MCHC 33.4 30.0 - 36.0 g/dL   RDW 15.7 (H) 11.5 - 15.5 %   Platelets 217 150 - 400 K/uL  CBC     Status: Abnormal   Collection Time: 02/02/17  5:22 AM  Result Value Ref Range   WBC 9.6 4.0 - 10.5 K/uL   RBC 3.17 (L) 3.87 - 5.11 MIL/uL   Hemoglobin 10.4 (L) 12.0 -  15.0 g/dL   HCT 30.8 (L) 36.0 - 46.0 %   MCV 97.2 78.0 - 100.0 fL   MCH 32.8 26.0 - 34.0 pg   MCHC 33.8 30.0 - 36.0 g/dL   RDW 16.4 (H) 11.5 - 15.5 %   Platelets 231 150 - 400 K/uL  Procalcitonin     Status: None   Collection Time: 02/02/17  5:22 AM  Result Value Ref Range   Procalcitonin 0.60 ng/mL    Comment:        Interpretation: PCT > 0.5 ng/mL and <= 2 ng/mL: Systemic infection (sepsis) is possible, but other conditions are known to elevate PCT as well. (NOTE)         ICU PCT Algorithm               Non ICU PCT Algorithm    ----------------------------     ------------------------------         PCT < 0.25 ng/mL                 PCT < 0.1 ng/mL     Stopping of antibiotics  Stopping of antibiotics       strongly encouraged.               strongly encouraged.    ----------------------------     ------------------------------       PCT level decrease by               PCT < 0.25 ng/mL       >= 80% from peak PCT       OR PCT 0.25 - 0.5 ng/mL          Stopping of antibiotics                                             encouraged.     Stopping of antibiotics           encouraged.    ----------------------------     ------------------------------       PCT level decrease by              PCT >= 0.25 ng/mL       < 80% from peak PCT        AND PCT >= 0.5 ng/mL             Continuing antibiotics                                              encouraged.       Continuing antibiotics            encouraged.    ----------------------------     ------------------------------     PCT level increase compared          PCT > 0.5 ng/mL         with peak PCT AND          PCT >= 0.5 ng/mL             Escalation of antibiotics                                          strongly encouraged.      Escalation of antibiotics        strongly encouraged.   Basic metabolic panel     Status: Abnormal   Collection Time: 02/02/17  5:22 AM  Result Value Ref Range   Sodium 140 135 - 145 mmol/L    Potassium 4.3 3.5 - 5.1 mmol/L   Chloride 113 (H) 101 - 111 mmol/L   CO2 17 (L) 22 - 32 mmol/L   Glucose, Bld 95 65 - 99 mg/dL   BUN <5 (L) 6 - 20 mg/dL   Creatinine, Ser 0.96 0.44 - 1.00 mg/dL   Calcium 9.1 8.9 - 10.3 mg/dL   GFR calc non Af Amer >60 >60 mL/min   GFR calc Af Amer >60 >60 mL/min    Comment: (NOTE) The eGFR has been calculated using the CKD EPI equation. This calculation has not been validated in all clinical situations. eGFR's persistently <60 mL/min signify possible Chronic Kidney Disease.    Anion gap 10 5 - 15  Glucose, capillary     Status: None   Collection Time:  02/02/17  7:28 AM  Result Value Ref Range   Glucose-Capillary 86 65 - 99 mg/dL  CBC     Status: Abnormal   Collection Time: 02/03/17  4:58 AM  Result Value Ref Range   WBC 7.9 4.0 - 10.5 K/uL   RBC 2.50 (L) 3.87 - 5.11 MIL/uL   Hemoglobin 8.3 (L) 12.0 - 15.0 g/dL    Comment: DELTA CHECK NOTED REPEATED TO VERIFY    HCT 23.9 (L) 36.0 - 46.0 %   MCV 95.6 78.0 - 100.0 fL   MCH 33.2 26.0 - 34.0 pg   MCHC 34.7 30.0 - 36.0 g/dL   RDW 16.6 (H) 11.5 - 15.5 %   Platelets 216 150 - 400 K/uL  Basic metabolic panel     Status: Abnormal   Collection Time: 02/03/17  4:58 AM  Result Value Ref Range   Sodium 136 135 - 145 mmol/L   Potassium 3.7 3.5 - 5.1 mmol/L   Chloride 112 (H) 101 - 111 mmol/L   CO2 15 (L) 22 - 32 mmol/L   Glucose, Bld 83 65 - 99 mg/dL   BUN 5 (L) 6 - 20 mg/dL   Creatinine, Ser 0.97 0.44 - 1.00 mg/dL   Calcium 8.4 (L) 8.9 - 10.3 mg/dL   GFR calc non Af Amer >60 >60 mL/min   GFR calc Af Amer >60 >60 mL/min    Comment: (NOTE) The eGFR has been calculated using the CKD EPI equation. This calculation has not been validated in all clinical situations. eGFR's persistently <60 mL/min signify possible Chronic Kidney Disease.    Anion gap 9 5 - 15  Glucose, capillary     Status: None   Collection Time: 02/03/17  6:03 AM  Result Value Ref Range   Glucose-Capillary 93 65 - 99 mg/dL     Radiology/Results: No results found.  Anti-infectives: Anti-infectives    Start     Dose/Rate Route Frequency Ordered Stop   01/31/17 1530  azithromycin (ZITHROMAX) tablet 500 mg     500 mg Oral Every 24 hours 01/31/17 1446     01/31/17 1530  cefTRIAXone (ROCEPHIN) 1 g in dextrose 5 % 50 mL IVPB  Status:  Discontinued     1 g 100 mL/hr over 30 Minutes Intravenous Every 24 hours 01/31/17 1446 02/01/17 1321      Assessment/Plan: Problem List: Patient Active Problem List   Diagnosis Date Noted  . Chronic anterior anal fissure   . External thrombosed hemorrhoids   . Rectal pain   . Hyponatremia 01/29/2017  . Pancreatic insufficiency 01/27/2017  . Acute on chronic pancreatitis ([place]) 01/25/2017  . Rectal bleeding 12/28/2016  . Decreased hemoglobin 12/28/2016  . Pain of upper abdomen 12/07/2016  . Pancreatic steatorrhea   . Severe protein-calorie malnutrition ([place])   . Unintentional weight loss of more than 10 pounds 11/13/2016  . 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 Chronic pain syndrome 09/07/2016  . Hemorrhoids 08/23/2016  . Hypertension 07/23/2016  . 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 Thoracic aortic atherosclerosis ([place]) 06/20/2016  . Dilation of pancreatic duct 06/16/2016  . Hepatic lesion 06/05/2016  . History of hypertension 06/05/2016  . Possible Cirrhosis 06/05/2016  . Hypokalemia   . Chronic pancreatitis ([place]) 04/21/2016  . Asthma 04/21/2016  . 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 S/P cholecystectomy 04/21/2016  . Tobacco abuse 04/21/2016  . Alcohol use disorder, mild, abuse 04/21/2016  . History of partial pancreatectomy 11/24/2015    If she is having such blood  loss from hemorrhoidal disease, then I would imagine that her cirrhosis is real and more significant than imagined.  Her IVC filter could be contributing to her elevated venous pressure both portal and systemic.   1 Day Post-Op    LOS: 9 days   Matt B. Hassell Done, MD,  [facility] [phone] beeper [phone]  02/03/2017 10:02 AM

## 2017-02-03 NOTE — Progress Notes (Signed)
Progress Note   Subjective  Chief Complaint: Rectal Bleeding, Anemia  Pt underwent colonoscopy 02/02/17 which revealed thrombosed external hemorrhoids and anal fissure. She was started on Diltiazem 2% topical and 5% lidocaine.   This morning, the patient is found asleep in bed, she tells me that overall she feels much improved. She does continue with quite a large amount of rectal pain, but is aware of findings after colo yesterday.  Patient denies any new complaints or concerns.  Per nursing two "big clots" of blood found near rectum when patient was washed this morning.   Objective   Vital signs in last 24 hours: Temp:  [97.7 F (36.5 C)-98 F (36.7 C)] 98 F (36.7 C) (10/27 0557) Pulse Rate:  [78-95] 82 (10/27 0557) Resp:  [16-22] 20 (10/27 0557) BP: (90-107)/(58-72) 103/69 (10/27 0557) SpO2:  [88 %-94 %] 93 % (10/27 0557) Last BM Date: 02/01/17 General:    Ill appearing, frail, white female in NAD Heart:  Regular rate and rhythm; no murmurs Lungs: Respirations even and unlabored, lungs CTA bilaterally, on O2 via [place] Abdomen:  Soft, nontender and nondistended. Normal bowel sounds. Neurologic:  Alert and oriented,  grossly normal neurologically. Psych:  Cooperative. Normal mood and affect.  Intake/Output from previous day: 10/26 0701 - 10/27 0700 In: 620 [P.O.:120; I.V.:500] Out: 0   Lab Results:  Recent Labs  02/01/17 1952 02/02/17 0522 02/03/17 0458  WBC 10.7* 9.6 7.9  HGB 9.8* 10.4* 8.3*  HCT 29.3* 30.8* 23.9*  PLT 217 231 216   BMET  Recent Labs  02/01/17 0546 02/02/17 0522 02/03/17 0458  NA 138 140 136  K 4.4 4.3 3.7  CL 113* 113* 112*  CO2 16* 17* 15*  GLUCOSE 105* 95 83  BUN <5* <5* 5*  CREATININE 0.84 0.96 0.97  CALCIUM 8.5* 9.1 8.4*    Colonoscopy 02/03/17, Dr. Leone PayorGessner: Findings:      The perianal exam findings include thrombosed external hemorrhoids.      The digital rectal exam findings include rectal tenderness.      A anal fissure  was found in the anal canal.      External hemorrhoids were found during retroflexion and during perianal       exam.      The exam was otherwise without abnormality on direct and retroflexion       views. Impression:               - Thrombosed external hemorrhoids found on perianal                            exam.                           - Rectal tenderness found on digital rectal exam.                           - Anal fissure.                           - External hemorrhoids.                           - The examination was otherwise normal on direct  and retroflexion views.                           - No specimens collected. Recommendation:           - Resume previous diet.                           - Continue present medications.                           - Repeat colonoscopy in 10 years for screening                            purposes.                           - Return patient to hospital ward for ongoing care.                           - Diltiazem 2% topical and 5% lidocaine to                            ano-rectum ordered    Assessment / Plan:   Assessment: 1. Rectal bleeding: from anal fissure and hemorrhoids identified via colonoscopy yesterday 2. Anemia: hgb did drop from 10.4 to 8.3 overnight  Plan: 1. Continue Diltiazem 2% topical and 5% lidocaine to ano-rectum TID x6-8 weeks 2. Continue sitz baths and other comfort measures for hemorrhoids 3. Continue other supportive measures 4. Please await any final recommendations from Dr. Leone Payor later today  Discharge Planning Diet: Regular Discharge Medications: Diltiazem as above, Lidocaine as above Follow up: in 4-6 weeks  Thank you for your kind consultation.   LOS: 9 days   Unk Lightning  02/03/2017, 9:14 AM  Pager # [phone]    [facility] Attending   I have taken an interval history, reviewed the chart and examined the patient. I agree with the Advanced Practitioner's  note, impression and recommendations.   I explained her situation - fissure, hemorrhoids. Changes in Hgb a bit puzzling but suspect at least some dilution.  She does not have cirrhosis and no signs of sig portal htn.  Agree w/ rehab.  I can see her in outpt f/u after that.  NEEDS TO STAY ON FISSURE TX X MONTHS MOST LIKELY AND THAT IS A COMPUNDED MED FROM [facility] - SHE SHOULD TAKE IT WITH HER WHEN DCED  Iva Boop, MD, [facility] [phone] (pager) 02/03/2017 11:11 AM

## 2017-02-03 NOTE — Progress Notes (Signed)
Subjective:  Patient was evaluated this morning on rounds. She was sitting up comfortably in bed eating her breakfast. She reports improvement in her energy level. She denies shortness of breath, chest pain or abdominal pain. She reports an episode of bleeding this morning from her hemorrhoids.   Objective:  Vital signs in last 24 hours: Vitals:   02/02/17 1200 02/02/17 1344 02/02/17 2225 02/03/17 0557  BP: 95/67 107/72 (!) 90/58 103/69  Pulse: 89 85 78 82  Resp: (!) 22 18 16 20   Temp:  97.7 F (36.5 C) 97.8 F (36.6 C) 98 F (36.7 C)  TempSrc:  Oral Oral Oral  SpO2: 94% 93% 91% 93%  Weight:      Height:       Physical Exam  Constitutional:  Thin, chronically ill appearing woman sitting up in bed in no acute distress  Cardiovascular: Normal rate, regular rhythm and intact distal pulses.  Exam reveals no friction rub.   No murmur heard. Pulmonary/Chest: Effort normal. No respiratory distress. She has no wheezes. She has no rales.  Abdominal: Soft. She exhibits no distension. There is no tenderness. There is no guarding.  Stable, easily reducible ventral hernia in mid abdomen that is non tender with palpation.   Musculoskeletal: She exhibits edema. She exhibits no tenderness.  1+ pitting edema to mid shins bilaterally,   Skin: Skin is warm and dry. Capillary refill takes less than 2 seconds. No rash noted. No erythema.   Assessment/Plan:  Principal Problem:   Acute on chronic pancreatitis (HCC) Active Problems:   Alcohol use disorder, mild, abuse   Hypokalemia   Pancreatic insufficiency   Hyponatremia   Rectal pain   Chronic anterior anal fissure   External thrombosed hemorrhoids  Earnestine MealingVictoria Kent is a 56 yo with a PMH of chronic pancreatitis 2/2 alcohol use and pancreas divisum, GERD, asthma, and tobacco use who presented for acute worsening of chronic abdominal pain and nausea/vomiting. Her initial workup in the ED was consistent with acute pancreatitis. She was  admitted to the internal medicine teaching service for management. The specific problems addressed during admission are as follows:  SOB 2/2 volume overload vs pneumonia:Patient clinically improving with diuresis and treatment of community acquired pneumonia.  -Continue azithromycin 500 mg daily (day 4 of 5) -Daily lasix 20 mg  -Strict I&O, daily weights -Wean oxygen as tolerated  Acute on chronic anemia 2/2 hemorrhoidal bleeding: Patient's CBC this AM was 8.3 which is more consistent with s/p 1 unit of PRBC after hemoglobin of 7.2. Patient had colonoscopy yesterday showing anal fissure and external hemorrhoids. Continue with supportive care. -appreciate GI and surgery recommendations - follow up with GI in 4-6 weeks -tucks pads, hemorrhoid cream -cbc  Hypokalemia and hyponatremia, chronic with metabolic acidosis: AM labs today with Na of 136, K of 3.7, and bicarb of 15, Will continue with bicarb replacement and with electrolyte monitoring  -K-Dur40 mEq BID and Sodium Bicarbonate 650 mg TID -BMP   Acute on chronic pancreatitis:  -Continue Creon, 72,000 units TID with meals -Continue home Tramadol 50 mg daily for pain  Bilateral ankle sprains (grade 3 R and grade 2-3 on L) s/p unwitnessed fall on 10/21:  -Orthopedic surgery will follow up as outpatient in 1-2 weeks  Hx of alcohol use:  -CIWA while inpatient -Daily multivitamin  Anal fissure -Continue Diltiazem 2% topical and 5% lidocaine to ano-rectum TID x6-8 weeks  Diet:HH fluid restricted, 2L VTE prophylaxis: Lovenox Code:Full  [facility]  Aleyna Cueva,  Bary Richard, DO 02/03/2017, 10:41 AM Pager: [phone]

## 2017-02-03 NOTE — Clinical Social Work Placement (Signed)
Nurse to call report to [phone], Room 123B    CLINICAL SOCIAL WORK PLACEMENT  NOTE  Date:  02/03/2017  Patient Details  Name: Sharon Kent MRN: [id number] Date of Birth: Sep 03, 1960  Clinical Social Work is seeking post-discharge placement for this patient at the Skilled  Nursing Facility level of care (*CSW will initial, date and re-position this form in  chart as items are completed):  Yes   Patient/family provided with [facility] Clinical Social Work Department's list of facilities offering this level of care within the geographic area requested by the patient (or if unable, by the patient's family).  Yes   Patient/family informed of their freedom to choose among providers that offer the needed level of care, that participate in Medicare, Medicaid or managed care program needed by the patient, have an available bed and are willing to accept the patient.  Yes   Patient/family informed of 's ownership interest in [facility] and [facility], as well as of the fact that they are under no obligation to receive care at these facilities.  PASRR submitted to EDS on 01/29/17     PASRR number received on 01/29/17     Existing PASRR number confirmed on       FL2 transmitted to all facilities in geographic area requested by pt/family on 01/29/17     FL2 transmitted to all facilities within larger geographic area on       Patient informed that his/her managed care company has contracts with or will negotiate with certain facilities, including the following:        Yes   Patient/family informed of bed offers received.  [facility]     Physician recommends and patient chooses bed at      [facility]  Patient to be transferred to facility by [facility]     Patient family notified on 02/03/17 of transfer.  Name of family member notified:  patient responsible for self      PHYSICIAN Please prepare prescriptions     Additional Comment:    _______________________________________________ Baldemar LenisElizabeth M Lewi Drost, LCSW 02/03/2017, 12:48 PM

## 2017-02-08 ENCOUNTER — Encounter: Payer: Managed Care, Other (non HMO) | Admitting: Internal Medicine

## 2017-02-08 MED FILL — Medication: Qty: 1 | Status: AC

## 2017-03-19 ENCOUNTER — Ambulatory Visit: Payer: Managed Care, Other (non HMO)

## 2017-03-28 ENCOUNTER — Encounter: Payer: Self-pay | Admitting: Internal Medicine

## 2017-03-28 ENCOUNTER — Ambulatory Visit: Payer: Managed Care, Other (non HMO)

## 2017-04-12 ENCOUNTER — Ambulatory Visit (INDEPENDENT_AMBULATORY_CARE_PROVIDER_SITE_OTHER): Payer: Managed Care, Other (non HMO) | Admitting: Internal Medicine

## 2017-04-12 ENCOUNTER — Encounter (HOSPITAL_COMMUNITY): Payer: Self-pay | Admitting: Emergency Medicine

## 2017-04-12 ENCOUNTER — Telehealth: Payer: Self-pay | Admitting: Internal Medicine

## 2017-04-12 ENCOUNTER — Encounter: Payer: Self-pay | Admitting: Internal Medicine

## 2017-04-12 ENCOUNTER — Inpatient Hospital Stay (HOSPITAL_COMMUNITY)
Admission: EM | Admit: 2017-04-12 | Discharge: 2017-04-14 | DRG: 640 | Disposition: A | Discharge status: Home Health | Payer: Managed Care, Other (non HMO) | Attending: Internal Medicine | Admitting: Internal Medicine

## 2017-04-12 ENCOUNTER — Other Ambulatory Visit: Payer: Self-pay

## 2017-04-12 VITALS — BP 106/68 | HR 69 | Temp 97.5°F | Ht 66.0 in | Wt 140.8 lb

## 2017-04-12 DIAGNOSIS — F17210 Nicotine dependence, cigarettes, uncomplicated: Secondary | ICD-10-CM | POA: Diagnosis present

## 2017-04-12 DIAGNOSIS — E876 Hypokalemia: Secondary | ICD-10-CM | POA: Diagnosis present

## 2017-04-12 DIAGNOSIS — K861 Other chronic pancreatitis: Secondary | ICD-10-CM | POA: Diagnosis not present

## 2017-04-12 DIAGNOSIS — W19XXXA Unspecified fall, initial encounter: Secondary | ICD-10-CM | POA: Diagnosis present

## 2017-04-12 DIAGNOSIS — K645 Perianal venous thrombosis: Secondary | ICD-10-CM | POA: Diagnosis not present

## 2017-04-12 DIAGNOSIS — K219 Gastro-esophageal reflux disease without esophagitis: Secondary | ICD-10-CM | POA: Diagnosis present

## 2017-04-12 DIAGNOSIS — E872 Acidosis, unspecified: Secondary | ICD-10-CM | POA: Diagnosis present

## 2017-04-12 DIAGNOSIS — M6281 Muscle weakness (generalized): Secondary | ICD-10-CM

## 2017-04-12 DIAGNOSIS — K760 Fatty (change of) liver, not elsewhere classified: Secondary | ICD-10-CM

## 2017-04-12 DIAGNOSIS — Z86711 Personal history of pulmonary embolism: Secondary | ICD-10-CM | POA: Diagnosis not present

## 2017-04-12 DIAGNOSIS — K8689 Other specified diseases of pancreas: Secondary | ICD-10-CM

## 2017-04-12 DIAGNOSIS — K860 Alcohol-induced chronic pancreatitis: Secondary | ICD-10-CM | POA: Diagnosis present

## 2017-04-12 DIAGNOSIS — K649 Unspecified hemorrhoids: Secondary | ICD-10-CM | POA: Diagnosis present

## 2017-04-12 DIAGNOSIS — F1010 Alcohol abuse, uncomplicated: Secondary | ICD-10-CM | POA: Diagnosis not present

## 2017-04-12 DIAGNOSIS — E871 Hypo-osmolality and hyponatremia: Secondary | ICD-10-CM | POA: Diagnosis present

## 2017-04-12 DIAGNOSIS — N179 Acute kidney failure, unspecified: Secondary | ICD-10-CM | POA: Diagnosis present

## 2017-04-12 DIAGNOSIS — Z9181 History of falling: Secondary | ICD-10-CM | POA: Diagnosis not present

## 2017-04-12 DIAGNOSIS — Z885 Allergy status to narcotic agent status: Secondary | ICD-10-CM | POA: Diagnosis not present

## 2017-04-12 DIAGNOSIS — Z6823 Body mass index (BMI) 23.0-23.9, adult: Secondary | ICD-10-CM | POA: Diagnosis not present

## 2017-04-12 DIAGNOSIS — Z720 Tobacco use: Secondary | ICD-10-CM

## 2017-04-12 DIAGNOSIS — I493 Ventricular premature depolarization: Secondary | ICD-10-CM | POA: Diagnosis present

## 2017-04-12 DIAGNOSIS — R918 Other nonspecific abnormal finding of lung field: Secondary | ICD-10-CM | POA: Diagnosis not present

## 2017-04-12 DIAGNOSIS — R771 Abnormality of globulin: Secondary | ICD-10-CM | POA: Diagnosis not present

## 2017-04-12 DIAGNOSIS — K8681 Exocrine pancreatic insufficiency: Secondary | ICD-10-CM | POA: Diagnosis not present

## 2017-04-12 DIAGNOSIS — K602 Anal fissure, unspecified: Secondary | ICD-10-CM | POA: Diagnosis not present

## 2017-04-12 DIAGNOSIS — Y92009 Unspecified place in unspecified non-institutional (private) residence as the place of occurrence of the external cause: Secondary | ICD-10-CM

## 2017-04-12 DIAGNOSIS — R296 Repeated falls: Secondary | ICD-10-CM | POA: Diagnosis present

## 2017-04-12 DIAGNOSIS — R531 Weakness: Secondary | ICD-10-CM | POA: Diagnosis not present

## 2017-04-12 DIAGNOSIS — K644 Residual hemorrhoidal skin tags: Secondary | ICD-10-CM

## 2017-04-12 DIAGNOSIS — J45909 Unspecified asthma, uncomplicated: Secondary | ICD-10-CM

## 2017-04-12 DIAGNOSIS — E43 Unspecified severe protein-calorie malnutrition: Secondary | ICD-10-CM | POA: Diagnosis present

## 2017-04-12 DIAGNOSIS — Z9889 Other specified postprocedural states: Secondary | ICD-10-CM

## 2017-04-12 DIAGNOSIS — Z79899 Other long term (current) drug therapy: Secondary | ICD-10-CM

## 2017-04-12 DIAGNOSIS — N2589 Other disorders resulting from impaired renal tubular function: Secondary | ICD-10-CM | POA: Diagnosis not present

## 2017-04-12 DIAGNOSIS — K529 Noninfective gastroenteritis and colitis, unspecified: Secondary | ICD-10-CM | POA: Diagnosis not present

## 2017-04-12 LAB — COMPREHENSIVE METABOLIC PANEL
ALT: 20 U/L (ref 14–54)
AST: 69 U/L — AB (ref 15–41)
Albumin: 2 g/dL — ABNORMAL LOW (ref 3.5–5.0)
Alkaline Phosphatase: 178 U/L — ABNORMAL HIGH (ref 38–126)
Anion gap: 10 (ref 5–15)
BILIRUBIN TOTAL: 1.5 mg/dL — AB (ref 0.3–1.2)
BUN: 8 mg/dL (ref 6–20)
CHLORIDE: 96 mmol/L — AB (ref 101–111)
CO2: 22 mmol/L (ref 22–32)
CREATININE: 1.29 mg/dL — AB (ref 0.44–1.00)
Calcium: 7.7 mg/dL — ABNORMAL LOW (ref 8.9–10.3)
GFR, EST AFRICAN AMERICAN: 53 mL/min — AB (ref >60)
GFR, EST NON AFRICAN AMERICAN: 45 mL/min — AB (ref >60)
Glucose, Bld: 135 mg/dL — ABNORMAL HIGH (ref 65–99)
Sodium: 128 mmol/L — ABNORMAL LOW (ref 135–145)
TOTAL PROTEIN: 7 g/dL (ref 6.5–8.1)

## 2017-04-12 LAB — CBC
HCT: 29.4 % — ABNORMAL LOW (ref 36.0–46.0)
HEMATOCRIT: 32.5 % — AB (ref 36.0–46.0)
HEMOGLOBIN: 11.1 g/dL — AB (ref 12.0–15.0)
Hemoglobin: 9.9 g/dL — ABNORMAL LOW (ref 12.0–15.0)
MCH: 27.1 pg (ref 26.0–34.0)
MCH: 27.4 pg (ref 26.0–34.0)
MCHC: 33.7 g/dL (ref 30.0–36.0)
MCHC: 34.2 g/dL (ref 30.0–36.0)
MCV: 80.5 fL (ref 78.0–100.0)
MCV: 80.2 fL (ref 78.0–100.0)
PLATELETS: 252 10*3/uL (ref 150–400)
Platelets: 262 10*3/uL (ref 150–400)
RBC: 3.65 MIL/uL — AB (ref 3.87–5.11)
RBC: 4.05 MIL/uL (ref 3.87–5.11)
RDW: 14.6 % (ref 11.5–15.5)
RDW: 14.7 % (ref 11.5–15.5)
WBC: 6.4 10*3/uL (ref 4.0–10.5)
WBC: 7.4 10*3/uL (ref 4.0–10.5)

## 2017-04-12 LAB — BASIC METABOLIC PANEL
ANION GAP: 12 (ref 5–15)
BUN: 8 mg/dL (ref 6–20)
CALCIUM: 7.7 mg/dL — AB (ref 8.9–10.3)
CO2: 19 mmol/L — ABNORMAL LOW (ref 22–32)
Chloride: 94 mmol/L — ABNORMAL LOW (ref 101–111)
Creatinine, Ser: 1.28 mg/dL — ABNORMAL HIGH (ref 0.44–1.00)
GFR, EST AFRICAN AMERICAN: 53 mL/min — AB (ref >60)
GFR, EST NON AFRICAN AMERICAN: 46 mL/min — AB (ref >60)
Glucose, Bld: 132 mg/dL — ABNORMAL HIGH (ref 65–99)
Sodium: 125 mmol/L — ABNORMAL LOW (ref 135–145)

## 2017-04-12 LAB — MAGNESIUM: MAGNESIUM: 1.6 mg/dL — AB (ref 1.7–2.4)

## 2017-04-12 MED ORDER — POTASSIUM CHLORIDE CRYS ER 20 MEQ PO TBCR
40.0000 meq | EXTENDED_RELEASE_TABLET | Freq: Once | ORAL | Status: AC
  Administered 2017-04-12: 40 meq via ORAL
  Filled 2017-04-12: qty 2

## 2017-04-12 MED ORDER — POTASSIUM CHLORIDE 10 MEQ/100ML IV SOLN: 10.0000 meq | Freq: Once | INTRAVENOUS | Status: DC

## 2017-04-12 MED ORDER — POTASSIUM CHLORIDE 10 MEQ/100ML IV SOLN
10.0000 meq | INTRAVENOUS | Status: AC
  Administered 2017-04-12 – 2017-04-13 (×4): 10 meq via INTRAVENOUS
  Filled 2017-04-12 (×3): qty 100

## 2017-04-12 MED ORDER — SODIUM BICARBONATE 650 MG PO TABS
650.0000 mg | ORAL_TABLET | Freq: Three times a day (TID) | ORAL | Status: DC
  Administered 2017-04-13 – 2017-04-14 (×5): 650 mg via ORAL
  Filled 2017-04-12 (×6): qty 1

## 2017-04-12 MED ORDER — HYDROCORTISONE 2.5 % RE CREA
TOPICAL_CREAM | Freq: Four times a day (QID) | RECTAL | Status: DC
  Administered 2017-04-13 – 2017-04-14 (×3): via RECTAL
  Filled 2017-04-12: qty 28.35

## 2017-04-12 MED ORDER — ADULT MULTIVITAMIN W/MINERALS CH
1.0000 | ORAL_TABLET | Freq: Every day | ORAL | Status: DC
  Administered 2017-04-13 – 2017-04-14 (×2): 1 via ORAL
  Filled 2017-04-12 (×2): qty 1

## 2017-04-12 MED ORDER — POTASSIUM CHLORIDE CRYS ER 20 MEQ PO TBCR
40.0000 meq | EXTENDED_RELEASE_TABLET | Freq: Four times a day (QID) | ORAL | Status: AC
  Administered 2017-04-13 (×3): 40 meq via ORAL
  Filled 2017-04-12 (×3): qty 2

## 2017-04-12 MED ORDER — ENOXAPARIN SODIUM 40 MG/0.4ML ~~LOC~~ SOLN
40.0000 mg | Freq: Every day | SUBCUTANEOUS | Status: DC
  Filled 2017-04-12 (×3): qty 0.4

## 2017-04-12 MED ORDER — SODIUM CHLORIDE 0.9 % IV SOLN
INTRAVENOUS | Status: AC
  Administered 2017-04-13 (×2): via INTRAVENOUS
  Filled 2017-04-12 (×3): qty 1000

## 2017-04-12 MED ORDER — PANCRELIPASE (LIP-PROT-AMYL) 12000-38000 UNITS PO CPEP
72000.0000 [IU] | ORAL_CAPSULE | Freq: Three times a day (TID) | ORAL | Status: DC
  Administered 2017-04-13 – 2017-04-14 (×5): 72000 [IU] via ORAL
  Filled 2017-04-12: qty 6
  Filled 2017-04-12 (×4): qty 2
  Filled 2017-04-12 (×2): qty 6

## 2017-04-12 MED ORDER — MAGNESIUM SULFATE 2 GM/50ML IV SOLN
2.0000 g | Freq: Once | INTRAVENOUS | Status: AC
  Administered 2017-04-12: 2 g via INTRAVENOUS
  Filled 2017-04-12: qty 50

## 2017-04-12 MED ORDER — PHENYLEPHRINE IN HARD FAT 0.25 % RE SUPP: 1.0000 | Freq: Two times a day (BID) | RECTAL | 0 refills | Status: DC

## 2017-04-12 NOTE — Assessment & Plan Note (Signed)
Undetectable today on Stat labs. Patient contacted and advised to go to ED tonight for IV repletion and monitoring. Would recommend they obtain Mag level as well.

## 2017-04-12 NOTE — Assessment & Plan Note (Signed)
Continues to smoke 0.5 to 0.66 packs per day. Does not wish to stop currently.

## 2017-04-12 NOTE — ED Triage Notes (Signed)
Sent by PCP for K replacement, pt went today d/t not feeling well for past several days, blood work checked and K noted to be <2, Pt was also hyponatremic, Na 128

## 2017-04-12 NOTE — ED Provider Notes (Signed)
[facility] EMERGENCY DEPARTMENT Provider Note   CSN: [id number] Arrival date & time: 04/12/17  2007     History   Chief Complaint Chief Complaint  Patient presents with  . Hypokalemia    HPI Sharon Kent is a 57 y.o. female.  Patient with history of chronic pancreatitis, PE s/p IVC filter, necrotizing pancreatitis, h/o alcohol abuse presents with low potassium found after seeing her doctor earlier today. She made the appointment with her clinic for evaluation of generalized weakness, "feeling off" described as off balance with reports of multiple falls over the last 2 weeks. She reports having a history of low potassium requiring supplementation in the past. No pain, fever, SOB, nausea, vomiting.    The history is provided by the patient. No language interpreter was used.    Past Medical History:  Diagnosis Date  . Asthma   . GERD (gastroesophageal reflux disease)   . H/O chronic pancreatitis   . H/O ETOH abuse   . Headache    "monthly" (06/15/2016)  . History of blood transfusion 2000s   "when they explored my abdomen"  . Hx of pulmonary embolus   . Necrotizing pancreatitis 2015  . 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 Pancreatic abscess   . Pneumonia    "twice at least" (06/15/2016)    Patient Active Problem List   Diagnosis Date Noted  . Fall at home, initial encounter 04/12/2017  . Chronic anterior anal fissure   . Pancreatic insufficiency 01/27/2017  . Severe protein-calorie malnutrition (HCC)   . Purpura, nonthrombopenic (HCC) 09/07/2016  . Chronic pain syndrome 09/07/2016  . External hemorrhoids 08/23/2016  . Essential hypertension 07/23/2016  . 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 Thoracic aortic atherosclerosis (HCC) 06/20/2016  . Hepatic lesion 06/05/2016  . History of hypertension 06/05/2016  . Possible Cirrhosis 06/05/2016  . Hypokalemia   . Chronic alcoholic pancreatitis (HCC) 04/21/2016  . Asthma  04/21/2016  . 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 S/P cholecystectomy 04/21/2016  . Tobacco abuse 04/21/2016  . Alcohol use disorder, mild, abuse 04/21/2016  . History of partial pancreatectomy 11/24/2015    Past Surgical History:  Procedure Laterality Date  . COLONOSCOPY    . COLONOSCOPY N/A 02/02/2017   Procedure: COLONOSCOPY;  Surgeon: Iva Boop, MD;  Location: [facility] ENDOSCOPY;  Service: Endoscopy;  Laterality: N/A;  . ERCP  2018   multiple  . EXPLORATORY LAPAROTOMY  2000s   "took out all my organs and washed the dead tissue"  . IVC FILTER PLACEMENT ([facility] HX)  12/2010?  Marland Kitchen LAPAROSCOPIC CHOLECYSTECTOMY  2000s    OB History    No data available       Home Medications    Prior to Admission medications   Medication Sig Start Date End Date Taking? Authorizing Provider  Albuterol Sulfate 108 (90 Base) MCG/ACT AEPB Inhale 2 puffs into the lungs as needed. Patient taking differently: Inhale 2 puffs into the lungs every 4 (four) hours as needed (for shortness of breath).  06/05/16   Burns, Tinnie Gens, MD  hydrocortisone (ANUSOL-HC) 2.5 % rectal cream Place rectally 4 (four) times daily. 02/03/17   Camelia Phenes, DO  Multiple Vitamins-Minerals (MULTIVITAMIN WITH MINERALS) tablet Take 1 tablet by mouth daily.    [provider]  NONFORMULARY OR COMPOUNDED ITEM Apply 1 application topically 3 (three) times daily. 02/03/17   Hoffman, Jessica Ratliff, DO  Pancrelipase, Lip-Prot-Amyl, 24000-76000 units CPEP Take 3 capsules (72,000 Units total) by mouth 3 (  three) times daily with meals. 02/03/17   Geralyn Corwin Ratliff, DO  pantoprazole (PROTONIX) 40 MG tablet Take 1 tablet (40 mg total) by mouth daily. 09/07/16   Gust Rung, DO  phenylephrine (,USE FOR PREPARATION-H,) 0.25 % suppository Place 1 suppository rectally 2 (two) times daily. 04/12/17   Nyra Market, MD  potassium chloride SA (K-DUR,KLOR-CON) 20 MEQ tablet Take 2 tablets (40 mEq total) by mouth 2 (two) times  daily. 02/03/17   Geralyn Corwin Ratliff, DO  sodium bicarbonate 650 MG tablet Take 1 tablet (650 mg total) by mouth 3 (three) times daily. 02/03/17   Camelia Phenes, DO  witch hazel-glycerin (TUCKS) pad Apply 1 application topically as needed for itching. Patient not taking: Reported on 01/19/2017 01/10/17   Deneise Lever, MD    Family History Family History  Adopted: Yes  Family history unknown: Yes    Social History Social History   Tobacco Use  . Smoking status: Current Some Day Smoker    Packs/day: 0.50    Years: 31.00    Pack years: 15.50    Types: Cigarettes  . Smokeless tobacco: Never Used  Substance Use Topics  . Alcohol use: Yes    Comment: previous heavy every day intake, currently denies 04/12/17  . Drug use: No     Allergies   Morphine and Oxycodone-acetaminophen   Review of Systems Review of Systems  Constitutional: Negative for chills and fever.  HENT: Negative.   Respiratory: Negative.  Negative for shortness of breath.   Cardiovascular: Negative.  Negative for chest pain.  Gastrointestinal: Negative.  Negative for abdominal pain and vomiting.  Musculoskeletal: Negative.   Skin: Negative.   Neurological: Positive for weakness.       See HPI.     Physical Exam Updated Vital Signs BP 95/66   Pulse (!) 55   Temp 98 F (36.7 C) (Oral)   Resp 11   Ht 5\' 7"  (1.702 m)   Wt 63.5 kg (140 lb)   SpO2 97%   BMI 21.93 kg/m   Physical Exam  Constitutional: She is oriented to person, place, and time. She appears well-developed and well-nourished.  Chronically ill appearing, in NAD.  HENT:  Head: Normocephalic.  Eyes: No scleral icterus.  Neck: Normal range of motion. Neck supple.  Cardiovascular: Normal rate and regular rhythm.  No murmur heard. Pulmonary/Chest: Effort normal and breath sounds normal. She has no wheezes. She has no rales.  Abdominal: Soft. Bowel sounds are normal. There is no tenderness. There is no rebound and no  guarding.  Musculoskeletal: Normal range of motion.  Neurological: She is alert and oriented to person, place, and time.  Skin: Skin is warm and dry. There is pallor.  Multiple purpuritic areas to chest and arms.  Psychiatric: She has a normal mood and affect.     ED Treatments / Results  Labs (all labs ordered are listed, but only abnormal results are displayed) Labs Reviewed  BASIC METABOLIC PANEL - Abnormal; Notable for the following components:      Result Value   Sodium 125 (*)    Potassium <2.0 (*)    Chloride 94 (*)    CO2 19 (*)    Glucose, Bld 132 (*)    Creatinine, Ser 1.28 (*)    Calcium 7.7 (*)    GFR calc non Af Amer 46 (*)    GFR calc Af Amer 53 (*)    All other components within normal limits  CBC - Abnormal; Notable  for the following components:   Hemoglobin 11.1 (*)    HCT 32.5 (*)    All other components within normal limits  MAGNESIUM - Abnormal; Notable for the following components:   Magnesium 1.6 (*)    All other components within normal limits  HEPATIC FUNCTION PANEL  LIPASE, BLOOD    EKG  EKG Interpretation None       Radiology No results found.  Procedures Procedures (including critical care time)  Medications Ordered in ED Medications  potassium chloride 10 mEq in 100 mL IVPB (10 mEq Intravenous New Bag/Given 04/12/17 2232)  potassium chloride SA (K-DUR,KLOR-CON) CR tablet 40 mEq (40 mEq Oral Given 04/12/17 2233)     Initial Impression / Assessment and Plan / ED Course  I have reviewed the triage vital signs and the nursing notes.  Pertinent labs & imaging results that were available during my care of the patient were reviewed by me and considered in my medical decision making (see chart for details).     Patient presents with low potassium found at her primary care's office this morning. K+ <2 confirmed. Multiple runs of potassium ordered as well as oral 40 mEq.   She is comfortable appearing. VSS, mild hypotension.   Discussed  with Internal Medicine admitting resident who accepts the patient onto his service. Patient stable.  Final Clinical Impressions(s) / ED Diagnoses   Final diagnoses:  None   1. Hypokalemia 2. Hyponatremia   ED Discharge Orders    None       Elpidio AnisUpstill, Doreatha Offer, PA-C 04/12/17 2305    Rolan BuccoBelfi, Melanie, MD 04/12/17 2315

## 2017-04-12 NOTE — Telephone Encounter (Signed)
Called patient about critically low potassium found on stat clinic labs. Advised she needed to go to ED tonight for IV repletion and cardiac monitoring; she is in agreement and stated that if she cannot find ride will call EMS for transport.  Other findings on Cmet include hyponatremia to 128, AKI with Cr 1.29 (b/l 0.8).   Recommendations: --obtain urine and serum osmolarity as well as urine Na for qualification of hyponatremia prior to any fluid administration as could be 2/2 poor PO intake or tea/toast diet. She has had falls in last week which could be due to hyponatremia so would avoid rapid correction --IV potassium with cardiac monitoring --check Mag level and replete to >2.0  Nyra MarketGorica Keliah Harned, MD IMTS - PGY2

## 2017-04-12 NOTE — H&P (Signed)
Date: 04/13/2017               Patient Name:  Sharon Kent MRN: [id number]  DOB: 1961-02-21 Age / Sex: 57 y.o., female   PCP: Lucious Groves, DO         Medical Service: Internal Medicine Teaching Service         Attending Physician: Dr. Oda Kilts, MD    First Contact: Dr. Maricela Bo Pager: [phone]  Second Contact: Dr. Danford Bad Pager: [phone]       After Hours (After 5p/  First Contact Pager: [phone]  weekends / holidays): Second Contact Pager: [phone]   Chief Complaint: low potassium  History of Present Illness:  57 yo female with PMHx of chronic pancreatitis likely 2/2 EtOH use disorder, chronic pancreatic insufficiency, h/o thrombosed hemorrhoids w/ h/o bleeding requiring blood transfusion, hepatic steatosis, and chronic metabolic acidosis presenting to the ED after clinic labs revealed an undetectable potassium level.   She was hospitalized in October for acute on chronic pancreatitis thought to be provoked by continued EtOH use and had  multiple metabolic abnormalities (hypokalemia, hypomagnesemia, and chronic NAGMA) at that time.  She was discharged on 40 meq potassium BID and sodium bicarb supplementation. She reports compliance with potassium supplementation. Denies diarrhea or recent infections. Denies resumption of EtOH intake.   She also reports a decreased appetite. She typically eats 2 meals daily, which consist of cereal and soups. She denies decreased fluid intake and states she is drinking usual amount of water. She does states that she has been urinating less frequently, 1-2 times daily as opposed to 3-4 times daily. She denies nausea, vomiting, or abdominal pain. Per Dr. Jari Favre progress note has also been compliant with Creon about twice daily with snacks but states she never eats a full meal.   The patient has also been experiencing generalized weakness and fatigue for the past week. She has a walker at home and had to start using it within the last week to  get around her house. She has had two falls within the past week, both which resulted in her hitting her head. She denies LOC, no prodromal symptoms, and no bowel/bladder incontinence associated with the falls. She states If she falls she is unable to get up and needs to call a friend/neighbor for help. Denies numbness or tingling in her extremities.   She denies chest pain, palpitations, shortness of breath, fevers or chills recently.   ED Course: Vitals: Blood pressure 95/66, pulse (!) 55, temperature 98 F (36.7 C), resp. rate 11, SpO2 97 %. Labs: Na 125, K <2.0, Chloride 94, CO2 19, Cr 1.28 (bl 0.97), Ca 7.7, Mg 1.6, Albumin 2.0, ALK 178, Tbili 1.5, AST 69; Hgb 11.1, WBC 7.4 Meds: 40 meq Kdur, IV K+  Meds:  Current Meds  Medication Sig  . Albuterol Sulfate 108 (90 Base) MCG/ACT AEPB Inhale 2 puffs into the lungs as needed. (Patient taking differently: Inhale 2 puffs into the lungs every 4 (four) hours as needed (for shortness of breath). )  . hydrocortisone (ANUSOL-HC) 2.5 % rectal cream Place rectally 4 (four) times daily.  . Multiple Vitamins-Minerals (MULTIVITAMIN WITH MINERALS) tablet Take 1 tablet by mouth daily.  . Pancrelipase, Lip-Prot-Amyl, 24000-76000 units CPEP Take 3 capsules (72,000 Units total) by mouth 3 (three) times daily with meals.  . pantoprazole (PROTONIX) 40 MG tablet Take 1 tablet (40 mg total) by mouth daily.  . phenylephrine (,USE FOR PREPARATION-H,) 0.25 % suppository Place 1 suppository  rectally 2 (two) times daily.  . potassium chloride SA (K-DUR,KLOR-CON) 20 MEQ tablet Take 2 tablets (40 mEq total) by mouth 2 (two) times daily.     Allergies: Allergies as of 04/12/2017 - Review Complete 04/12/2017  Allergen Reaction Noted  . Morphine Nausea Only 11/23/2015  . Oxycodone-acetaminophen Other (See Comments) 12/26/2012   Past Medical History:  Diagnosis Date  . Asthma   . GERD (gastroesophageal reflux disease)   . H/O chronic pancreatitis   . H/O ETOH  abuse   . Headache    "monthly" (06/15/2016)  . History of blood transfusion 2000s   "when they explored my abdomen"  . Hx of pulmonary embolus   . Necrotizing pancreatitis 2015  . 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 Pancreatic abscess   . Pneumonia    "twice at least" (06/15/2016)    Family History:  Family History  Adopted: Yes  Family history unknown: Yes    Social History:  Social History   Tobacco Use  . Smoking status: Current Some Day Smoker    Packs/day: 0.50    Years: 31.00    Pack years: 15.50    Types: Cigarettes  . Smokeless tobacco: Never Used  Substance Use Topics  . Alcohol use: Yes    Comment: previous heavy every day intake, currently denies 04/12/17  . Drug use: No    Review of Systems: A complete ROS was negative except as per HPI.   Physical Exam: Blood pressure 98/69, pulse 61, temperature 98 F (36.7 C), temperature source Oral, resp. rate 11, height 5' 7" (1.702 m), weight 140 lb (63.5 kg), SpO2 99 %. Physical Exam  Constitutional: She is oriented to person, place, and time. She has a sickly appearance.  Chronically ill appearing, NAD; appears older than age  HENT:  Head: Normocephalic and atraumatic.  Mouth/Throat: Oropharynx is clear and moist.  Eyes: Conjunctivae are normal. No scleral icterus.  Neck: Normal range of motion. Neck supple.  Cardiovascular: Normal rate, regular rhythm, normal heart sounds and intact distal pulses.  Pulmonary/Chest: Effort normal and breath sounds normal.  Abdominal: Soft. Bowel sounds are normal. She exhibits no distension. There is no tenderness.  Musculoskeletal: Normal range of motion. She exhibits edema (bilateral 2+ pitting edema LE).  Neurological: She is oriented to person, place, and time. No cranial nerve deficit.  Skin: Skin is warm and dry.    EKG: personally reviewed my interpretation is sinus rhythm with PVCs, possible U waves in various leads  CXR: none to review  Assessment & Plan by  Problem: Principal Problem:   Hypokalemia Active Problems:   Chronic alcoholic pancreatitis ([place])   Severe protein-calorie malnutrition ([place])   Pancreatic insufficiency   Fall at home, initial encounter  Hypokalemia, Hypomagnesia Metabolic derangements AKI Multiple metabolic derangements including hypokalemia with EKG changes, hypomagnesia, NAGMA, hyponatremia, and elevated creatinine. Patient appears chronically ill and malnourished, with hypoalbuminemia and bilateral LE edema. I believe the patient's electrolyte disturbances are most likely due to decreased PO intake, and the generalized weakness she is experiencing is due to these electrolyte disturbances, as well as overall deconditioning.  -Cardiac telemetry -40 meq Kdur q6 hours, 10 meq IV K+ x 4 -1L NS with 40 meq potassium infusion @ 100 cc/hr x 24 hours  -2 g IV Mg -BMET q6 hours -Repeat EKG in AM -PT/OT -Nutrition consult -Continue Creon TID with meals   Hemorrhoids Denies recent bleeding, but endorses painf the past few days. Colonoscopy in 01/2017 with thrombosed  external hemorrhoids and anal fissure.  -Anusol cream QID,  -Lidocaine 5% ointment, Diltiazem 2% gel PRN  Code Status: Partial (no intubation); confirmed on admission  Dispo: Admit patient to Inpatient with expected length of stay greater than 2 midnights.  Signed: Melanee Spry, MD 04/13/2017, 12:10 AM  Pager: [phone]

## 2017-04-12 NOTE — ED Notes (Signed)
Per Dr. Criss AlvineGoldston pt needs next room, pt has EKG changes r/t low K

## 2017-04-12 NOTE — Patient Instructions (Signed)
I will call you with the results of your blood work and if we need to change anything. I have refilled your prescriptions; you should be able to pick up refills of the compounded medicine at [facility] for your hemorrhoids, if not let them know to send for a refill to us.

## 2017-04-12 NOTE — Assessment & Plan Note (Signed)
Refilled PrepH suppository; should have refills of compounded diltiazem/lidocaine cream. She will call if new script is needed.  She is currently not having bleeding or symptoms of anemia.

## 2017-04-12 NOTE — Assessment & Plan Note (Addendum)
Patient was transferred to SNF for rehabilitation following her Oct hospitalization; she was discharged home at beginning of December. She states when she got home she was overall feeling a lot stronger than she had in a while. She reports 2 falls in the last week or so and both resulted in her hitting her head (once against cabinet and once against floor). She denies loss of consciousness, loss of bowel/bladder control; she endorses dizziness on standing but not prior to her falls; she thinks she might have had an unsteady gait prior to first fall. She endorses worsening of her generalized weakness since the falls but denies focal weakness. She states she has trouble getting up out of bed/seated position due to combination of pain in bil quads and weakness (more UE vs LE). She denies numbness/tingling. Patient states she is able to usually get to the restroom on her own and get her own food but has not left the house since coming home from rehabilitation. She does endorse not always making it on time to the restroom due to her weakness.  Neuro exam is unremarkable today. She does not have signs or symptoms of subdural hematoma so will not pursue imaging at this time. Orthostatic vital signs negative and did not endorse dizziness on position change during exam.  Plan: --stat CBC - Hgb 9.9 (improved), platelets stable at 200s --stat CMET - <2 potassium (discussed elsewhere), Bicarb 22, mild AKI (cr 1.29, b/l 0.8), HypoNa 128 (previous 136). --hyponatremia could be due to poor PO intake or tea/toast diet; if subacute could cause gait instability and falls --referred to ED for emergent potassium repletion, will suggest they obtain urine and serum osm as well as urine Na prior to any IV fluids  --HH PT/OT/RN/SW ordered for weakness, home assessment and community resources

## 2017-04-12 NOTE — Assessment & Plan Note (Signed)
Patient denies current EtOH use. Her friend who brought patient to visit confided that he's worried she started drinking again. I advised patient against EtOH intake.

## 2017-04-12 NOTE — Progress Notes (Signed)
CC: generalized weakness  HPI:  [name] is a 57 y.o. with a PMH of chronic pancreatitis likely 2/2 EtOH use disorder, chronic pancreatic insufficiency, h/o thrombosed hemorrhoids w/ h/o bleeding requiring blood transfusion, hepatic steatosis, chronic metabolic acidosis presenting to clinic for hospital follow up from hospitalization in Oct, as well as for evaluation of falls and weakness.  Patient hospitalized in Oct for acute on chronic pancreatitis thought to be provoked by continued EtOH use; she had multiple metabolic abnormalities (hypokalemia, hypomagnesemia, and chronic NAGMA) and at discharge was asked to continue potassium and sodium bicarb supplementation. Patient endorses continuing potassium supplementation but stopping sodium bicarb due to preference. She endorses nausea and vomiting less than once per week and attributes it more to the foods she eats (like tomato soup). She states her diet is mainly soups, dry cereal and water. She denies resumption of EtOH intake. She endorses compliance with Creon about twice daily with snacks but states she never eats a full meal - denies abd pain, nausea, early satiety.   Hemorrhoids: During hospitalization she had an acute on chronic anemia with Hgb drop to 7.2 with symptoms due to bleeding and thrombosed external hemorrhoids. She received 1 unit pRBCs during hospitalization with stabilization of her Hgb (7.9 on discharge). She had a colonoscopy which showed anal fissure and thrombosed external hemorrhoids; she was started on diltiazem/lidocaine sup and was to follow up with GI in 4-6 weeks which she has not done yet. She ran out of creams and her hemorrhoids are providing discomfort again; she denies further bleeding, melena or hematochezia.   Falls, generalized weakness: Patient was transferred to SNF for rehabilitation following her Oct hospitalization; she was discharged home at beginning of December. She states when she got home  she was overall feeling a lot stronger than she had in a while. She reports 2 falls in the last week or so and both resulted in her hitting her head (once against cabinet and once against floor). She denies loss of consciousness, loss of bowel/bladder control; she endorses dizziness on standing but not prior to her falls; she thinks she might have had an unsteady gait prior to first fall. She endorses worsening of her generalized weakness since the falls but denies focal weakness. She states she has trouble getting up out of bed/seated position due to combination of pain in bil quads and weakness (more UE vs LE). She denies numbness/tingling. Patient states she is able to usually get to the restroom on her own and get her own food but has not left the house since coming home from rehabilitation. She does endorse not always making it on time to the restroom due to her weakness.  Patient denies constipation, diarrhea, cough, shortness of breath, headache, vision or hearing changes, urinary symptoms, rash. She does endorse bil LE swelling which she states is improved from her stay at SNF.   Please see problem based Assessment and Plan for status of patients chronic conditions.  Past Medical History:  Diagnosis Date  . Asthma   . GERD (gastroesophageal reflux disease)   . H/O chronic pancreatitis   . H/O ETOH abuse   . Headache    "monthly" (06/15/2016)  . History of blood transfusion 2000s   "when they explored my abdomen"  . Hx of pulmonary embolus   . Necrotizing pancreatitis 2015  . 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 Pancreatic abscess   . Pneumonia    "twice at least" (06/15/2016)   Review  of Systems:   ROS Per HPI  Physical Exam:  Vitals:   04/12/17 1516 04/12/17 1555  BP: 115/68 101/66  Pulse: 61 (!) 54  Temp: (!) 97.5 F (36.4 C)   TempSrc: Oral   SpO2: 100%   Weight: 140 lb 12.8 oz (63.9 kg)   Height: 5\' 6"  (1.676 m)    GENERAL- alert, appears older than stated age,  chronically ill appearing HEENT- Atraumatic, normocephalic, PERRL, EOMI, oral mucosa appears moist CARDIAC- RRR, no murmurs, rubs or gallops. RESP- Moving equal volumes of air, and clear to auscultation bilaterally, no wheezes or crackles. ABDOMEN- Soft, nontender, bowel sounds present. NEURO- CN 2-12 intact, generalized 4/5 weakness in all extremities, sensation intact throughout; per RN report, able to stand up on her own and walk to exam table, needed 1 person assistance to get on exam table. EXTREMITIES- pulse 2+, symmetric, 1+ bil LE pitting edema SKIN- Warm, dry, no rash or lesion. PSYCH- Normal mood and affect, appropriate thought content and speech.  Assessment & Plan:   See Encounters Tab for problem based charting.  Patient discussed with Dr. Cecilie KicksKlima   Salley Boxley, MD Internal Medicine PGY2

## 2017-04-13 ENCOUNTER — Other Ambulatory Visit (HOSPITAL_COMMUNITY): Payer: Self-pay

## 2017-04-13 ENCOUNTER — Other Ambulatory Visit: Payer: Self-pay

## 2017-04-13 DIAGNOSIS — E876 Hypokalemia: Principal | ICD-10-CM

## 2017-04-13 DIAGNOSIS — E872 Acidosis, unspecified: Secondary | ICD-10-CM | POA: Diagnosis present

## 2017-04-13 DIAGNOSIS — K649 Unspecified hemorrhoids: Secondary | ICD-10-CM

## 2017-04-13 DIAGNOSIS — R918 Other nonspecific abnormal finding of lung field: Secondary | ICD-10-CM

## 2017-04-13 DIAGNOSIS — K861 Other chronic pancreatitis: Secondary | ICD-10-CM

## 2017-04-13 DIAGNOSIS — K8681 Exocrine pancreatic insufficiency: Secondary | ICD-10-CM

## 2017-04-13 DIAGNOSIS — R771 Abnormality of globulin: Secondary | ICD-10-CM

## 2017-04-13 DIAGNOSIS — K760 Fatty (change of) liver, not elsewhere classified: Secondary | ICD-10-CM

## 2017-04-13 LAB — BASIC METABOLIC PANEL
Anion gap: 7 (ref 5–15)
Anion gap: 10 (ref 5–15)
Anion gap: 9 (ref 5–15)
Anion gap: 6 (ref 5–15)
BUN: 7 mg/dL (ref 6–20)
BUN: 7 mg/dL (ref 6–20)
BUN: 7 mg/dL (ref 6–20)
BUN: 5 mg/dL — ABNORMAL LOW (ref 6–20)
CO2: 20 mmol/L — ABNORMAL LOW (ref 22–32)
CO2: 18 mmol/L — ABNORMAL LOW (ref 22–32)
CO2: 18 mmol/L — ABNORMAL LOW (ref 22–32)
CO2: 18 mmol/L — ABNORMAL LOW (ref 22–32)
Calcium: 7.4 mg/dL — ABNORMAL LOW (ref 8.9–10.3)
Calcium: 7.6 mg/dL — ABNORMAL LOW (ref 8.9–10.3)
Calcium: 7.6 mg/dL — ABNORMAL LOW (ref 8.9–10.3)
Calcium: 7.2 mg/dL — ABNORMAL LOW (ref 8.9–10.3)
Chloride: 103 mmol/L (ref 101–111)
Chloride: 102 mmol/L (ref 101–111)
Chloride: 106 mmol/L (ref 101–111)
Chloride: 108 mmol/L (ref 101–111)
Creatinine, Ser: 1.14 mg/dL — ABNORMAL HIGH (ref 0.44–1.00)
Creatinine, Ser: 1.07 mg/dL — ABNORMAL HIGH (ref 0.44–1.00)
Creatinine, Ser: 1.08 mg/dL — ABNORMAL HIGH (ref 0.44–1.00)
Creatinine, Ser: 1.09 mg/dL — ABNORMAL HIGH (ref 0.44–1.00)
GFR calc Af Amer: >60 mL/min (ref >60)
GFR calc Af Amer: >60 mL/min (ref >60)
GFR calc Af Amer: >60 mL/min (ref >60)
GFR calc Af Amer: >60 mL/min (ref >60)
GFR calc non Af Amer: 53 mL/min — ABNORMAL LOW (ref >60)
GFR calc non Af Amer: 57 mL/min — ABNORMAL LOW (ref >60)
GFR calc non Af Amer: 56 mL/min — ABNORMAL LOW (ref >60)
GFR calc non Af Amer: 56 mL/min — ABNORMAL LOW (ref >60)
Glucose, Bld: 101 mg/dL — ABNORMAL HIGH (ref 65–99)
Glucose, Bld: 117 mg/dL — ABNORMAL HIGH (ref 65–99)
Glucose, Bld: 113 mg/dL — ABNORMAL HIGH (ref 65–99)
Glucose, Bld: 123 mg/dL — ABNORMAL HIGH (ref 65–99)
Potassium: 2 mmol/L — CL (ref 3.5–5.1)
Potassium: 2.4 mmol/L — CL (ref 3.5–5.1)
Potassium: 3.3 mmol/L — ABNORMAL LOW (ref 3.5–5.1)
Potassium: 3.6 mmol/L (ref 3.5–5.1)
Sodium: 130 mmol/L — ABNORMAL LOW (ref 135–145)
Sodium: 130 mmol/L — ABNORMAL LOW (ref 135–145)
Sodium: 133 mmol/L — ABNORMAL LOW (ref 135–145)
Sodium: 132 mmol/L — ABNORMAL LOW (ref 135–145)

## 2017-04-13 LAB — HEPATIC FUNCTION PANEL
ALT: 22 U/L (ref 14–54)
AST: 76 U/L — ABNORMAL HIGH (ref 15–41)
Albumin: 2 g/dL — ABNORMAL LOW (ref 3.5–5.0)
Alkaline Phosphatase: 183 U/L — ABNORMAL HIGH (ref 38–126)
Bilirubin, Direct: 0.6 mg/dL — ABNORMAL HIGH (ref 0.1–0.5)
Indirect Bilirubin: 0.7 mg/dL (ref 0.3–0.9)
Total Bilirubin: 1.3 mg/dL — ABNORMAL HIGH (ref 0.3–1.2)
Total Protein: 7.1 g/dL (ref 6.5–8.1)

## 2017-04-13 LAB — MAGNESIUM: Magnesium: 2.2 mg/dL (ref 1.7–2.4)

## 2017-04-13 LAB — LIPASE, BLOOD: Lipase: 22 U/L (ref 11–51)

## 2017-04-13 MED ORDER — DILTIAZEM GEL 2 %: Freq: Two times a day (BID) | CUTANEOUS | Status: DC

## 2017-04-13 MED ORDER — LIDOCAINE 5 % EX OINT: TOPICAL_OINTMENT | Freq: Two times a day (BID) | CUTANEOUS | Status: DC | PRN

## 2017-04-13 MED ORDER — SODIUM CHLORIDE 0.9 % IV BOLUS (SEPSIS)
1000.0000 mL | Freq: Once | INTRAVENOUS | Status: AC
  Administered 2017-04-13: 1000 mL via INTRAVENOUS

## 2017-04-13 MED ORDER — POTASSIUM CHLORIDE 10 MEQ/100ML IV SOLN
10.0000 meq | INTRAVENOUS | Status: AC
  Administered 2017-04-13 (×2): 10 meq via INTRAVENOUS
  Filled 2017-04-13 (×2): qty 100

## 2017-04-13 MED ORDER — POTASSIUM CHLORIDE 10 MEQ/100ML IV SOLN: 10.0000 meq | Freq: Once | INTRAVENOUS | Status: DC

## 2017-04-13 MED ORDER — POTASSIUM CHLORIDE 10 MEQ/100ML IV SOLN
10.0000 meq | INTRAVENOUS | Status: AC
Start: 2017-04-13 — End: 2017-04-13
  Administered 2017-04-13 (×2): 10 meq via INTRAVENOUS
  Filled 2017-04-13 (×2): qty 100

## 2017-04-13 MED ORDER — THIAMINE HCL 100 MG/ML IJ SOLN
100.0000 mg | Freq: Every day | INTRAMUSCULAR | Status: DC
  Administered 2017-04-13 – 2017-04-14 (×2): 100 mg via INTRAVENOUS
  Filled 2017-04-13 (×2): qty 2

## 2017-04-13 NOTE — ED Notes (Signed)
Warm blanket provided.

## 2017-04-13 NOTE — Evaluation (Signed)
Physical Therapy Evaluation Patient Details Name: Sharon MealingVictoria Noguez MRN: [id number] DOB: Feb 09, 1961 Today's Date: 04/13/2017   History of Present Illness  Pt is a 57 y/o female with PMH of chronic pancreatitis likely 2/2 EtOH use disorder, chronic pancreatic insufficiency, h/o thrombosed hemorrhoids w/ h/o bleeding requiring blood transfusion, hepatic steatosis, and chronic metabolic acidosis presenting to the ED after clinic labs revealed an undetectable potassium level.   Clinical Impression  Pt presented supine in bed with HOB elevated, awake and willing to participate in therapy session. Prior to admission, pt reported that she ambulates with use of RW and performs ADLs independently with increased time. Pt lives alone in an apartment on the second floor with a full flight of stairs to get inside (no elevator). Pt currently limited secondary to fatigue and politely declining OOB activity despite encouragement from therapist. Pt also adamant about returning home upon d/c with HHPT versus going to a SNF for ST rehab. Pt would continue to benefit from skilled physical therapy services at this time while admitted and after d/c to address the below listed limitations in order to improve overall safety and independence with functional mobility.     Follow Up Recommendations SNF;Supervision/Assistance - 24 hour;Other (comment)(pt refusing SNF at this time, will need HHPT/HHaide & 24/7 A)    Equipment Recommendations  None recommended by PT    Recommendations for Other Services       Precautions / Restrictions Precautions Precautions: Fall Restrictions Weight Bearing Restrictions: No      Mobility  Bed Mobility Overal bed mobility: Needs Assistance Bed Mobility: Supine to Sit;Sit to Supine     Supine to sit: Min guard Sit to supine: Min guard   General bed mobility comments: increased time and effort, pt using UEs to assist with bilateral LE movement; min guard for safety  Transfers                 General transfer comment: despite encouragement from therapist, pt declining at this time as she was concerned about how she would return to bed  Ambulation/Gait                Stairs            Wheelchair Mobility    Modified Rankin (Stroke Patients Only)       Balance Overall balance assessment: Needs assistance;History of Falls Sitting-balance support: No upper extremity supported Sitting balance-Leahy Scale: Fair                                       Pertinent Vitals/Pain Pain Assessment: No/denies pain    Home Living Family/patient expects to be discharged to:: Private residence Living Arrangements: Alone Available Help at Discharge: Friend(s);Available PRN/intermittently Type of Home: Apartment Home Access: Stairs to enter Entrance Stairs-Rails: Right Entrance Stairs-Number of Steps: 14 Home Layout: One level Home Equipment: Walker - 2 wheels      Prior Function Level of Independence: Independent with assistive device(s)         Comments: pt reported that since previous admission she has been using a RW to ambulate; she reports being able to perform ADLs with increased time; pt only sponge bathes     Hand Dominance        Extremity/Trunk Assessment   Upper Extremity Assessment Upper Extremity Assessment: Generalized weakness    Lower Extremity Assessment Lower Extremity Assessment: Generalized weakness  Communication   Communication: No difficulties  Cognition Arousal/Alertness: Awake/alert Behavior During Therapy: Flat affect Overall Cognitive Status: Within Functional Limits for tasks assessed                                 General Comments: cognition not formally assessed but [facility] for general conversation      General Comments      Exercises     Assessment/Plan    PT Assessment Patient needs continued PT services  PT Problem List Decreased strength;Decreased  activity tolerance;Decreased balance;Decreased mobility;Decreased coordination;Decreased safety awareness;Decreased knowledge of precautions       PT Treatment Interventions DME instruction;Gait training;Functional mobility training;Stair training;Therapeutic activities;Therapeutic exercise;Balance training;Neuromuscular re-education;Patient/family education    PT Goals (Current goals can be found in the Care Plan section)  Acute Rehab PT Goals Patient Stated Goal: return home today PT Goal Formulation: With patient Time For Goal Achievement: 04/27/17 Potential to Achieve Goals: Good    Frequency Min 3X/week   Barriers to discharge Decreased caregiver support      Co-evaluation               AM-PAC PT "6 Clicks" Daily Activity  Outcome Measure Difficulty turning over in bed (including adjusting bedclothes, sheets and blankets)?: A Little Difficulty moving from lying on back to sitting on the side of the bed? : A Lot Difficulty sitting down on and standing up from a chair with arms (e.g., wheelchair, bedside commode, etc,.)?: Unable Help needed moving to and from a bed to chair (including a wheelchair)?: A Little Help needed walking in hospital room?: A Lot Help needed climbing 3-5 steps with a railing? : A Lot 6 Click Score: 13    End of Session   Activity Tolerance: Patient limited by fatigue Patient left: in bed;with call bell/phone within reach Nurse Communication: Mobility status PT Visit Diagnosis: Other abnormalities of gait and mobility (R26.89);Muscle weakness (generalized) (M62.81)    Time: 1191-4782 PT Time Calculation (min) (ACUTE ONLY): 18 min   Charges:   PT Evaluation $PT Eval Moderate Complexity: 1 Mod     PT G Codes:        [place], PT, DPT [phone]   Alessandra Bevels Errik Mitchelle 04/13/2017, 11:46 AM

## 2017-04-13 NOTE — ED Notes (Signed)
Notified pharmacy for daily meds 

## 2017-04-13 NOTE — Progress Notes (Signed)
Patient BP is 82/48.  Notified Internal Medicine.  Will continue to monitor the patient and notify as needed

## 2017-04-13 NOTE — Progress Notes (Signed)
   Subjective: Patient is doing well this AM. She states that she was feeling fine until she feel then her weakness was worse. She has not seen her GI doctor in a few months but states that she has been told this is not related to her pancreas. She continues to take her Creon as prescribed although she is not eating very much. She states that her LE swelling has been presented for approximately 3 months but she otherwise denies symptoms of heart failure. She denies diarrhea. Discussed that we will need to replete her electrolytes and try to determine why she is having recurrent issues with hypokalemia. She agrees. All questions and concerns addressed.   Objective: Vital signs in last 24 hours: Vitals:   04/13/17 0430 04/13/17 0500 04/13/17 0515 04/13/17 0545  BP: 92/64 108/80 95/69 92/61   Pulse: 62 75 66 62  Resp: 14 15 10 12   Temp:      TempSrc:      SpO2: 99% 100% 99% 98%  Weight:      Height:       General: Chronically ill appearing female in no acute distress Pulm: Diminished breath sounds but I did not appreciate any crackles or wheezing  CV: Distant heart sounds, RRR, no murmurs, no rubs noted Abdomen: Active bowel sounds, soft, non-distended, no tenderness to palpation  Extremities: Mild to moderate pitting edema to the knees on the bilateral LEs. Warm and dry    Assessment/Plan:  Hypokalemia with NAGMA  - The patient has been admitted a few times in the past with hypokalemia and a non-anion gap metabolic acidosis. The differential for this particular metabolic derangement includes persistent diarrhea, renal tubular acidosis (distal and proximal), or mineralocorticoid deficiency. The most likely etiology at this point is RTA; however with will obtain urine studies including urine potassium, sodium, chloride, and a UA.  - It is also likely that her multiple electrolyte abnormalities are due to decreased PO intake  - In the meantime we will aggressively replete her electrolyte  derangements and monitor her on telemetry  - EKG is showing multiple PVCs, obtaining Echocardiogram  - Continue frequent BMPs.  - PT/OT consulted  - Nutrition consulted  Chronic Pancreatitis  - Continue Creon TID with meals   Hemorrhoids  -Anusol cream QID,  -Lidocaine 5% ointment, Diltiazem 2% gel PRN  Protein Gap  - Patient has a mildly elevated protein gap which would argue against malnutrition. Looking through her history she did have a CT of the chest that illustrated 2 pulmonary nodules in September. We will obtain a repeat chest CT and then consider MM panel   Dispo: Anticipated discharge in approximately 1-2 day(s).   Levora DredgeHelberg, Woodward Klem, MD 04/13/2017, 6:39 AM My Pager: [phone]

## 2017-04-13 NOTE — ED Notes (Signed)
Admitting MD at bedside.

## 2017-04-13 NOTE — ED Notes (Signed)
Pt ate meal tray from cafeteria. 

## 2017-04-13 NOTE — Progress Notes (Signed)
Sharon Kent [id number]  Code Status: Partial  Admission Data: 04/13/2017 6:51 PM  Attending Provider: Sandre Kittyaines  WUJ:[id number]:Hoffman, Marthenia RollingErik C, DO  Consults/ Treatment Team:   Sharon MealingVictoria Kent is a 57 y.o. female patient admitted from ED awake, alert - oriented X 3 - no acute distress noted. VSS - Blood pressure 108/77, pulse 83, temperature 98 F (36.7 C), temperature source Oral, resp. rate 14, height 5\' 7"  (1.702 m), weight 67.5 kg (148 lb 14.4 oz), SpO2 94 %. no c/o shortness of breath, no c/o chest pain. Cardiac tele # 07, in place. IV Fluids: IV in place, occlusive dsg intact without redness. Allergies:  Allergies  Allergen Reactions  . Morphine Nausea Only  . Oxycodone-Acetaminophen Other (See Comments)    Makes her "loopy."      Past Medical History:  Diagnosis Date  . Asthma   . GERD (gastroesophageal reflux disease)   . H/O chronic pancreatitis   . H/O ETOH abuse   . Headache    "monthly" (06/15/2016)  . History of blood transfusion 2000s   "when they explored my abdomen"  . Hx of pulmonary embolus   . Necrotizing pancreatitis 2015  . 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 Pancreatic abscess   . Pneumonia    "twice at least" (06/15/2016)    Medications Prior to Admission  Medication Sig Dispense Refill  . Albuterol Sulfate 108 (90 Base) MCG/ACT AEPB Inhale 2 puffs into the lungs as needed. (Patient taking differently: Inhale 2 puffs into the lungs every 4 (four) hours as needed (for shortness of breath). ) 1 each 11  . hydrocortisone (ANUSOL-HC) 2.5 % rectal cream Place rectally 4 (four) times daily. 30 g 0  . Multiple Vitamins-Minerals (MULTIVITAMIN WITH MINERALS) tablet Take 1 tablet by mouth daily.    . Pancrelipase, Lip-Prot-Amyl, 24000-76000 units CPEP Take 3 capsules (72,000 Units total) by mouth 3 (three) times daily with meals. 270 capsule 3  . pantoprazole (PROTONIX) 40 MG tablet Take 1 tablet (40 mg total) by mouth daily. 90 tablet 3  . phenylephrine (,USE FOR  PREPARATION-H,) 0.25 % suppository Place 1 suppository rectally 2 (two) times daily. 12 suppository 0  . potassium chloride SA (K-DUR,KLOR-CON) 20 MEQ tablet Take 2 tablets (40 mEq total) by mouth 2 (two) times daily. 120 tablet 0  . NONFORMULARY OR COMPOUNDED ITEM Apply 1 application topically 3 (three) times daily. (Patient not taking: Reported on 04/12/2017) 1 each 11    Orientation to room, and floor completed with information packet given to patient/family. Patient declined safety video at this time. Admission INP armband ID verified with patient/family, and in place.  SR up x 2, fall assessment complete, with patient and family able to verbalize understanding of risk associated with falls, and verbalized understanding to call nsg before up out of bed. Call light within reach, patient able to voice, and demonstrate understanding. Skin, clean-dry- intact with generalized bruising and old scabbing.  ?  Will cont to eval and treat per MD orders.  Jon GillsElisa R Leeman Johnsey, RN  04/13/2017 6:51 PM

## 2017-04-13 NOTE — ED Notes (Signed)
Regular lunch tray ordered @ 1138 

## 2017-04-14 ENCOUNTER — Other Ambulatory Visit (HOSPITAL_COMMUNITY): Payer: Managed Care, Other (non HMO)

## 2017-04-14 ENCOUNTER — Other Ambulatory Visit: Payer: Self-pay

## 2017-04-14 DIAGNOSIS — K8689 Other specified diseases of pancreas: Secondary | ICD-10-CM

## 2017-04-14 DIAGNOSIS — N2589 Other disorders resulting from impaired renal tubular function: Secondary | ICD-10-CM

## 2017-04-14 DIAGNOSIS — R531 Weakness: Secondary | ICD-10-CM

## 2017-04-14 DIAGNOSIS — K529 Noninfective gastroenteritis and colitis, unspecified: Secondary | ICD-10-CM

## 2017-04-14 DIAGNOSIS — Z885 Allergy status to narcotic agent status: Secondary | ICD-10-CM

## 2017-04-14 LAB — URINALYSIS, ROUTINE W REFLEX MICROSCOPIC
Bilirubin Urine: NEGATIVE
Glucose, UA: NEGATIVE mg/dL
Hgb urine dipstick: NEGATIVE
Ketones, ur: NEGATIVE mg/dL
Leukocytes, UA: NEGATIVE
Nitrite: NEGATIVE
Protein, ur: NEGATIVE mg/dL
Specific Gravity, Urine: 1.014 (ref 1.005–1.030)
pH: 6 (ref 5.0–8.0)

## 2017-04-14 LAB — BASIC METABOLIC PANEL
Anion gap: 8 (ref 5–15)
Anion gap: 5 (ref 5–15)
BUN: 6 mg/dL (ref 6–20)
BUN: 5 mg/dL — ABNORMAL LOW (ref 6–20)
CO2: 18 mmol/L — ABNORMAL LOW (ref 22–32)
CO2: 16 mmol/L — ABNORMAL LOW (ref 22–32)
Calcium: 7.5 mg/dL — ABNORMAL LOW (ref 8.9–10.3)
Calcium: 7.4 mg/dL — ABNORMAL LOW (ref 8.9–10.3)
Chloride: 108 mmol/L (ref 101–111)
Chloride: 111 mmol/L (ref 101–111)
Creatinine, Ser: 1.07 mg/dL — ABNORMAL HIGH (ref 0.44–1.00)
Creatinine, Ser: 0.95 mg/dL (ref 0.44–1.00)
GFR calc Af Amer: >60 mL/min (ref >60)
GFR calc Af Amer: >60 mL/min (ref >60)
GFR calc non Af Amer: 57 mL/min — ABNORMAL LOW (ref >60)
GFR calc non Af Amer: >60 mL/min (ref >60)
Glucose, Bld: 106 mg/dL — ABNORMAL HIGH (ref 65–99)
Glucose, Bld: 124 mg/dL — ABNORMAL HIGH (ref 65–99)
Potassium: 3.8 mmol/L (ref 3.5–5.1)
Potassium: 3.8 mmol/L (ref 3.5–5.1)
Sodium: 134 mmol/L — ABNORMAL LOW (ref 135–145)
Sodium: 132 mmol/L — ABNORMAL LOW (ref 135–145)

## 2017-04-14 LAB — NA AND K (SODIUM & POTASSIUM), RAND UR
Potassium Urine: 44 mmol/L
Sodium, Ur: 22 mmol/L

## 2017-04-14 LAB — CHLORIDE, URINE, RANDOM: Chloride Urine: 33 mmol/L

## 2017-04-14 MED ORDER — LIDOCAINE 5 % EX OINT: TOPICAL_OINTMENT | Freq: Two times a day (BID) | CUTANEOUS | 0 refills | Status: DC | PRN

## 2017-04-14 MED ORDER — ALBUTEROL SULFATE 108 (90 BASE) MCG/ACT IN AEPB: 2.0000 | INHALATION_SPRAY | RESPIRATORY_TRACT | 0 refills | Status: DC | PRN

## 2017-04-14 MED ORDER — SODIUM BICARBONATE 650 MG PO TABS: 650.0000 mg | ORAL_TABLET | Freq: Three times a day (TID) | ORAL | 0 refills | Status: DC

## 2017-04-14 NOTE — Progress Notes (Signed)
   Subjective: Ms. Ninetta LightsHatcher is doing well this morning and tells me she is able to tolerate PO intake. She's aware she needs to increase her oral nutrition in order to help regain strength and will work on this at home. She asks me if she could be discharged home today as she feels fine. SNF was recommended however [facility] She tells me she has "a guy" who comes over to help her climb stairs and with tasks around the house.  Objective: Vital signs in last 24 hours: Vitals:   04/13/17 1602 04/13/17 2216 04/13/17 2358 04/14/17 0510  BP: 108/77 (!) 82/48 (!) 99/55 90/65  Pulse: 83 80  94  Resp: 14 17  17   Temp:  (!) 97.5 F (36.4 C)  98 F (36.7 C)  TempSrc:  Oral  Oral  SpO2: 94% 97%  100%  Weight: 148 lb 14.4 oz (67.5 kg)     Height: 5\' 7"  (1.702 m)      General: Chronically ill appearing female in no acute distress Pulm: Diminished breath sounds, no crackles or wheezes CV: Distant heart sounds, RRR, no murmurs Abdomen: Active bowel sounds, soft, non-distended Extremities: Mild to moderate pitting edema to the knees on the bilateral LEs. 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 Lytes stable this morning. Urine seem to indicate a possible distal RTA however does not fit her presentation completely. Suspect patients electrolyte abnormalities are due to combination of RTA, chronic diarrhea, CIRRHOSIS and poor PO intake. Treatment of hypokalemic dRTA includes alkali therapy with target serum bicarb of around 22-24. Hers is 18 today. Initial dose 1-882mEq/kg sodium bicarb or sodium citrate/day. She is presently prescribed and reportedly compliant with her potassium supplements and Sodium Bicarb 650 mg TID however I genuinely doubt this. -Discharge patient home with close outpatient follow-up with PCP here within the next 2 weeks -PT/OT recommend SNF placement however patient [facility]. These will be resumed at discharge.   Chronic Pancreatitis  - Continue Creon TID with meals   Hemorrhoids  -Anusol cream QID,  -Lidocaine 5% ointment, Diltiazem 2% gel PRN  Dispo: Anticipated discharge today.  Yarelis Ambrosino, DO 04/14/2017, 11:01 AM My Pager: [phone]

## 2017-04-14 NOTE — Progress Notes (Signed)
Physical Therapy Treatment Patient Details Name: Sharon MealingVictoria Kent MRN: [id number] DOB: 02/07/1961 Today's Date: 04/14/2017    History of Present Illness Pt is a 57 y/o female with PMH of chronic pancreatitis likely 2/2 EtOH use disorder, chronic pancreatic insufficiency, h/o thrombosed hemorrhoids w/ h/o bleeding requiring blood transfusion, hepatic steatosis, and chronic metabolic acidosis presenting to the ED after clinic labs revealed an undetectable potassium level.     PT Comments    Pt making excellent progress with functional mobility compared to previous session. She tolerated ambulation in hallway with min guard for safety using a RW. PT updated recommendations to d/c home with HHPT services. Pt would continue to benefit from skilled physical therapy services at this time while admitted and after d/c to address the below listed limitations in order to improve overall safety and independence with functional mobility.    Follow Up Recommendations  Home health PT     Equipment Recommendations  None recommended by PT    Recommendations for Other Services       Precautions / Restrictions Precautions Precautions: Fall Restrictions Weight Bearing Restrictions: No    Mobility  Bed Mobility Overal bed mobility: Needs Assistance Bed Mobility: Supine to Sit;Sit to Supine     Supine to sit: Supervision Sit to supine: Supervision   General bed mobility comments: increased time and effort, pt using UEs to assist with bilateral LE movement; supervision for safety  Transfers Overall transfer level: Needs assistance Equipment used: Rolling walker (2 wheeled) Transfers: Sit to/from Stand Sit to Stand: Supervision         General transfer comment: supervision for safety  Ambulation/Gait Ambulation/Gait assistance: Min guard Ambulation Distance (Feet): 100 Feet Assistive device: Rolling walker (2 wheeled) Gait Pattern/deviations: Step-through pattern;Decreased stride  length Gait velocity: decreased Gait velocity interpretation: Below normal speed for age/gender General Gait Details: steady with use of RW, min guard for safety, limited secondary to fatigue   Stairs            Wheelchair Mobility    Modified Rankin (Stroke Patients Only)       Balance Overall balance assessment: Needs assistance;History of Falls Sitting-balance support: No upper extremity supported Sitting balance-Leahy Scale: Good     Standing balance support: During functional activity;Bilateral upper extremity supported Standing balance-Leahy Scale: Poor                              Cognition Arousal/Alertness: Awake/alert Behavior During Therapy: Flat affect Overall Cognitive Status: Within Functional Limits for tasks assessed                                 General Comments: cognition not formally assessed but [facility] for general conversation      Exercises      General Comments        Pertinent Vitals/Pain Pain Assessment: No/denies pain    Home Living                      Prior Function            PT Goals (current goals can now be found in the care plan section) Acute Rehab PT Goals PT Goal Formulation: With patient Time For Goal Achievement: 04/27/17 Potential to Achieve Goals: Good Progress towards PT goals: Progressing toward goals    Frequency    Min 3X/week  PT Plan Discharge plan needs to be updated    Co-evaluation              AM-PAC PT "6 Clicks" Daily Activity  Outcome Measure  Difficulty turning over in bed (including adjusting bedclothes, sheets and blankets)?: None Difficulty moving from lying on back to sitting on the side of the bed? : None Difficulty sitting down on and standing up from a chair with arms (e.g., wheelchair, bedside commode, etc,.)?: Unable Help needed moving to and from a bed to chair (including a wheelchair)?: A Little Help needed walking in hospital  room?: A Little Help needed climbing 3-5 steps with a railing? : A Little 6 Click Score: 18    End of Session Equipment Utilized During Treatment: Gait belt Activity Tolerance: Patient tolerated treatment well Patient left: in bed;with call bell/phone within reach;with bed alarm set Nurse Communication: Mobility status PT Visit Diagnosis: Other abnormalities of gait and mobility (R26.89);Muscle weakness (generalized) (M62.81)     Time: 1610-9604 PT Time Calculation (min) (ACUTE ONLY): 27 min  Charges:  $Gait Training: 8-22 mins $Therapeutic Activity: 8-22 mins                    G Codes:       [place], [place], [place] [phone]    [name] 04/14/2017, 2:32 PM

## 2017-04-14 NOTE — Progress Notes (Signed)
Occupational Therapy Evaluation Patient Details Name: [name] MRN: [id number] DOB: 1961/01/20 Today's Date: 04/14/2017    History of Present Illness Pt is a 57 y/o female with PMH of chronic pancreatitis likely 2/2 EtOH use disorder, chronic pancreatic insufficiency, h/o thrombosed hemorrhoids w/ h/o bleeding requiring blood transfusion, hepatic steatosis, and chronic metabolic acidosis presenting to the ED after clinic labs revealed an undetectable potassium level.    Clinical Impression   Pt admitted with the above diagnoses and presents with below problem list. PTA pt was mod I with ADLs, sponge bathes. Pt is currently supervision to min guard with ADLs and functional mobility. Discussed basics of energy conservation as related to managing ADLs. Pt awaiting d/c home today. Will defer further OT needs to [facility].       Follow Up Recommendations  Home health OT;Supervision - Intermittent    Equipment Recommendations  None recommended by OT    Recommendations for Other Services       Precautions / Restrictions Precautions Precautions: Fall Restrictions Weight Bearing Restrictions: No      Mobility Bed Mobility Overal bed mobility: Needs Assistance Bed Mobility: Supine to Sit;Sit to Supine     Supine to sit: Supervision Sit to supine: Supervision   General bed mobility comments: EOB at start and end of session.   Transfers Overall transfer level: Needs assistance Equipment used: Rolling walker (2 wheeled) Transfers: Sit to/from Stand Sit to Stand: Supervision         General transfer comment: supervision for safety    Balance Overall balance assessment: Needs assistance;History of Falls Sitting-balance support: No upper extremity supported Sitting balance-Leahy Scale: Good     Standing balance support: During functional activity;Bilateral upper extremity supported Standing balance-Leahy Scale: Poor                             ADL either  performed or assessed with clinical judgement   ADL Overall ADL's : Needs assistance/impaired Eating/Feeding: Set up;Sitting   Grooming: Set up;Sitting   Upper Body Bathing: Set up;Sitting   Lower Body Bathing: Sit to/from stand;Supervison/ safety   Upper Body Dressing : Set up;Sitting   Lower Body Dressing: Sit to/from stand;Supervision/safety   Toilet Transfer: Min guard;Ambulation;RW   Toileting- Clothing Manipulation and Hygiene: Supervision/safety;Sit to/from Nurse, children's Details (indicate cue type and reason): sponge bath at baseline Functional mobility during ADLs: Minimal assistance;Rolling walker General ADL Comments: Pt completed household distance functional mobility. Discussed energy conservation strategies     Vision         Perception     Praxis      Pertinent Vitals/Pain Pain Assessment: No/denies pain     Hand Dominance Right   Extremity/Trunk Assessment Upper Extremity Assessment Upper Extremity Assessment: Generalized weakness   Lower Extremity Assessment Lower Extremity Assessment: Defer to PT evaluation       Communication Communication Communication: No difficulties   Cognition Arousal/Alertness: Awake/alert Behavior During Therapy: Flat affect Overall Cognitive Status: Within Functional Limits for tasks assessed                                 General Comments: delayed responses    General Comments       Exercises     Shoulder Instructions      Home Living Family/patient expects to be discharged to:: Private residence Living Arrangements: Alone Available Help  at Discharge: Friend(s);Available PRN/intermittently Type of Home: Apartment Home Access: Stairs to enter Entrance Stairs-Number of Steps: 14 Entrance Stairs-Rails: Right Home Layout: One level     Bathroom Shower/Tub: Chief Strategy OfficerTub/shower unit   Bathroom Toilet: Standard Bathroom Accessibility: No   Home Equipment: Environmental consultantWalker - 2  wheels;Bedside commode   Additional Comments: Reports she has intermittent help.      Prior Functioning/Environment Level of Independence: Independent with assistive device(s)        Comments: pt reported that since previous admission she has been using a RW to ambulate; she reports being able to perform ADLs with increased time; pt only sponge bathes        OT Problem List: Impaired balance (sitting and/or standing);Decreased activity tolerance;Decreased knowledge of use of DME or AE;Decreased knowledge of precautions      OT Treatment/Interventions:      OT Goals(Current goals can be found in the care plan section) Acute Rehab OT Goals Patient Stated Goal: return home today  OT Frequency:     Barriers to D/C:            Co-evaluation              AM-PAC PT "6 Clicks" Daily Activity     Outcome Measure Help from another person eating meals?: None Help from another person taking care of personal grooming?: None Help from another person toileting, which includes using toliet, bedpan, or urinal?: A Little Help from another person bathing (including washing, rinsing, drying)?: A Little Help from another person to put on and taking off regular upper body clothing?: None Help from another person to put on and taking off regular lower body clothing?: A Little 6 Click Score: 21   End of Session Equipment Utilized During Treatment: Rolling walker  Activity Tolerance: Patient tolerated treatment well Patient left: in bed;with call bell/phone within reach;with bed alarm set  OT Visit Diagnosis: Unsteadiness on feet (R26.81);History of falling (Z91.81);Muscle weakness (generalized) (M62.81)                Time: 1478-29561509-1521 OT Time Calculation (min): 12 min Charges:  OT General Charges $OT Visit: 1 Visit OT Evaluation $OT Eval Low Complexity: 1 Low G-Codes:       Pilar GrammesMathews, Sonnet Rizor H 04/14/2017, 3:30 PM

## 2017-04-14 NOTE — Care Management Note (Addendum)
Case Management Note  Patient Details  Name: Sharon MealingVictoria Akridge MRN: [id number] Date of Birth: 1960/05/13  Subjective/Objective:       Pt presented for abnormal labs and weakness.  Pt from home alone.  Pt has a friend who helps sometimes and uses a walker at home.  [facility] refused [facility] but [facility]          Action/Plan: Pt requests 3 in1 and shower stool. [facility]  Shower stool not available to pt.  [facility] but requests that [facility] make [facility] choice. [facility] to find [facility] that accepts pt's private [employer] plan.  Attempted [facility], [facility], [facility], [facility], [facility], and [facility]. All liasons state there are no other options.  Pt advised.  [facility] unable to take pt under charity program.  Attempted to update Dr. Vincente LibertyMolt, but page not returned.  Bedside RN advised.  Expected Discharge Date:  04/14/17               Expected Discharge Plan:  Home/self care In-House Referral:  NA  Discharge planning Services  CM Consult  Post Acute Care Choice:  Durable Medical Equipment, Home Health Choice offered to:  Patient  DME Arranged: 3N1 DME Agency:  [facility]  HH Arranged:  RN, PT, OT, NA [facility] Agency:  [facility] Status of Service:  Completed, signed off  If discussed at Long Length of Stay Meetings, dates discussed:    Additional Comments:  Verdene LennertGoldean, Caliann Leckrone K, RN 04/14/2017, 2:53 PM

## 2017-04-14 NOTE — Discharge Instructions (Signed)
[name], you were admitted for treatment of HYPOKALEMIA. This is also called LOW POTASSIUM. We treated this with IV and oral potassium replacement. In order to keep your electrolytes up, you NEED to take SODIUM BICARBONATE 650MG  THREE TIMES DAILY.  It is important that you also take your CREON as diarrhea contributes to electrolyte abnormalities as well.  Please continue to abstain from alcohol! Please make an appointment with Dr. Mikey BussingHoffman within the next 2 weeks to check your labs

## 2017-04-14 NOTE — Progress Notes (Signed)
Pt is waiting to be seen by OT.

## 2017-04-15 NOTE — Discharge Summary (Signed)
Name: Sharon Kent MRN: [id number] DOB: 1960-12-09 57 y.o. PCP: Gust Rung, DO  Date of Admission: 04/12/2017  9:06 PM Date of Discharge: 04/14/2017 Attending Physician: Gust Rung, DO  Discharge Diagnosis: 1. Symptomatic hypokalemia 2. 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 Weakness  Principal Problem:   Hypokalemia Active Problems:   Chronic alcoholic pancreatitis (HCC)   Severe protein-calorie malnutrition (HCC)   Pancreatic insufficiency   Fall at home, initial encounter   Metabolic acidosis, normal anion gap (NAG)   Discharge Medications: Allergies as of 04/14/2017      Reactions   Morphine Nausea Only   Oxycodone-acetaminophen Other (See Comments)   Makes her "loopy."      Medication List    TAKE these medications   Albuterol Sulfate 108 (90 Base) MCG/ACT Aepb Inhale 2 puffs into the lungs every 4 (four) hours as needed (for shortness of breath).   hydrocortisone 2.5 % rectal cream Commonly known as:  ANUSOL-HC Place rectally 4 (four) times daily.   lidocaine 5 % ointment Commonly known as:  XYLOCAINE Apply topically 2 (two) times daily as needed for mild pain.   multivitamin with minerals tablet Take 1 tablet by mouth daily.   NONFORMULARY OR COMPOUNDED ITEM Apply 1 application topically 3 (three) times daily.   Pancrelipase (Lip-Prot-Amyl) 24000-76000 units Cpep Take 3 capsules (72,000 Units total) by mouth 3 (three) times daily with meals.   pantoprazole 40 MG tablet Commonly known as:  PROTONIX Take 1 tablet (40 mg total) by mouth daily.   phenylephrine 0.25 % suppository Commonly known as:  (USE for PREPARATION-H) Place 1 suppository rectally 2 (two) times daily.   potassium chloride SA 20 MEQ tablet Commonly known as:  K-DUR,KLOR-CON Take 2 tablets (40 mEq total) by mouth 2 (two) times daily.   sodium bicarbonate 650 MG tablet Take 1 tablet (650 mg total) by mouth 3 (three) times daily.       Disposition and follow-up:     [name] was discharged from [facility] in stable condition.  At the hospital follow up visit please address:  1.  Hypokalemia: Ensure compliance with supplementation and creon ?RTA: Unclear picture, continue searching for etiology.   2.  Labs / imaging needed at time of follow-up: BMET  3.  Pending labs/ test needing follow-up: none  Follow-up Appointments: Follow-up Information    Gust Rung, DO. Schedule an appointment as soon as possible for a visit in 2 week(s).   Specialty:  Internal Medicine Contact information: [address] [place] [address] [phone]           Hospital Course by problem list: Principal Problem:   Hypokalemia Active Problems:   Chronic alcoholic pancreatitis (HCC)   Severe protein-calorie malnutrition (HCC)   Pancreatic insufficiency   Fall at home, initial encounter   Metabolic acidosis, normal anion gap (NAG)   1. Symptomatic hypokalemia 2. 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 On admission she reported compliance with her potassium, bicarbonate and Creon however was some concern for noncompliance. She does report a poor appetite and diarrhea. She required heavy replacement of potassium with multiple IV runs and by mouth doses in order to maintain a normal potassium level. Urine studies were obtained which hinted at a possible distal renal tubular acidosis however overall  picture somewhat inconsistent. Importance of compliance with her electrolyte supplementation was emphasized and she was discharged home with close follow-up with the next 2 weeks with her primary care physician.  3. Weakness, fall at home [name] was seen by physical therapy and occupational  therapy who initially recommended skilled nursing facility placement. His was discussed with the patient who refused placement. She was again reevaluated by physical therapy who recommended home health which was arranged for patient on discharge.  Discharge Vitals:   BP 122/87 (BP Location: Right Arm)   Pulse 87   Temp 97.8 F (36.6 C) (Oral)   Resp 20   Ht 5\' 7"  (1.702 m)   Wt 148 lb 14.4 oz (67.5 kg)   SpO2 100%   BMI 23.32 kg/m   Pertinent Labs, Studies, and Procedures:  Urine chloride: 33 Urine potassium: 44 Urine sodium: 22 UA: PH 6  Discharge Instructions:  [name] you were admitted for symptomatic hypokalemia. Is important that you continue to abstain from alcohol and are complaint with all of your home medications including Creon, sodium bicarbonate and potassium supplements. He will need follow-up with your primary care physician here within the next 1-2 weeks for continued workup. Please call the internal medicine office should you have any issues.  SignedNoemi Chapel: Sharon Hollifield, DO 04/15/2017, 12:39 PM   Pager: [phone]

## 2017-04-23 NOTE — Progress Notes (Signed)
Case discussed with Dr. Svalina at the time of the visit. We reviewed the resident's history and exam and pertinent patient test results. I agree with the assessment, diagnosis, and plan of care documented in the resident's note. 

## 2017-04-27 ENCOUNTER — Telehealth: Payer: Self-pay | Admitting: Internal Medicine

## 2017-04-27 NOTE — Telephone Encounter (Signed)
Patient has 2 weeks hospital on 05/10/2017 845am.

## 2017-04-27 NOTE — Telephone Encounter (Signed)
Transition Care Management Follow-up Telephone Call   Date discharged?"2 weeks ago, I dont remember exactly"   How have you been since you were released from the hospital? "Not well",    Do you understand why you were in the hospital? "yes, this time my K+ was low, last time my hemorrhoids, I need to get these taken care of, the pain is casing me to be immobile and they are bleeding"   Do you understand the discharge instructions? yes   Where were you discharged to? home   Items Reviewed:  Medications reviewed: yes  Allergies reviewed: yes  Dietary changes reviewed: yes  Referrals reviewed: yes   Functional Questionnaire:   Activities of Daily Living (ADLs):   She states they are independent in the following: ican take care of me but no household chores"   States they require assistance with the following: "my friend is helping me as much as he can"   Any transportation issues/concerns?: no   Any patient concerns: seeing a surgeon   Confirmed importance andyes date/time of follow-up visits scheduled yes  [facility], changed from 1/24 due to pain and bleeding  Confirmed with patient if condition begins to worsen call PCP or go to the ER.  Patient was given the office number and encouraged to call back with question or concerns.  : yes

## 2017-05-01 ENCOUNTER — Encounter: Payer: Self-pay | Admitting: Internal Medicine

## 2017-05-01 ENCOUNTER — Ambulatory Visit (INDEPENDENT_AMBULATORY_CARE_PROVIDER_SITE_OTHER): Payer: Managed Care, Other (non HMO) | Admitting: Internal Medicine

## 2017-05-01 ENCOUNTER — Other Ambulatory Visit: Payer: Self-pay

## 2017-05-01 VITALS — BP 116/74 | HR 80 | Temp 97.4°F | Ht 66.5 in | Wt 160.8 lb

## 2017-05-01 DIAGNOSIS — K602 Anal fissure, unspecified: Secondary | ICD-10-CM

## 2017-05-01 DIAGNOSIS — M7989 Other specified soft tissue disorders: Secondary | ICD-10-CM | POA: Insufficient documentation

## 2017-05-01 DIAGNOSIS — E876 Hypokalemia: Secondary | ICD-10-CM

## 2017-05-01 DIAGNOSIS — K644 Residual hemorrhoidal skin tags: Secondary | ICD-10-CM

## 2017-05-01 DIAGNOSIS — K6289 Other specified diseases of anus and rectum: Secondary | ICD-10-CM | POA: Diagnosis not present

## 2017-05-01 DIAGNOSIS — I872 Venous insufficiency (chronic) (peripheral): Secondary | ICD-10-CM | POA: Diagnosis not present

## 2017-05-01 MED ORDER — TRAMADOL HCL 50 MG PO TABS: 50.0000 mg | ORAL_TABLET | Freq: Four times a day (QID) | ORAL | 0 refills | Status: DC | PRN

## 2017-05-01 NOTE — Progress Notes (Signed)
   CC: Follow-up for hypokalemia and chronic external hemorrhoid bleeding and pain  HPI:  [name] is a 57 y.o. female with history noted below that presents to the acute care clinic for hypokalemia and chronic external hemorrhoid bleeding and pain.  Past Medical History:  Diagnosis Date  . Asthma   . GERD (gastroesophageal reflux disease)   . H/O chronic pancreatitis   . H/O ETOH abuse   . Headache    "monthly" (06/15/2016)  . History of blood transfusion 2000s   "when they explored my abdomen"  . Hx of pulmonary embolus   . Necrotizing pancreatitis 2015  . 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 Pancreatic abscess   . Pneumonia    "twice at least" (06/15/2016)    Review of Systems:  Review of Systems  Constitutional: Negative for malaise/fatigue.  Respiratory: Negative for shortness of breath.   Cardiovascular: Positive for leg swelling. Negative for chest pain.  Gastrointestinal: Negative for abdominal pain, diarrhea, nausea and vomiting.     Physical Exam:  Vitals:   05/01/17 1005  BP: 116/74  Pulse: 80  Temp: (!) 97.4 F (36.3 C)  TempSrc: Oral  SpO2: 100%  Weight: 160 lb 12.8 oz (72.9 kg)  Height: 5' 6.5" (1.689 m)   Physical Exam  Constitutional: She is well-developed, well-nourished, and in no distress.  Cardiovascular: Normal rate, regular rhythm and normal heart sounds. Exam reveals no gallop and no friction rub.  No murmur heard. Pulmonary/Chest: Effort normal and breath sounds normal. No respiratory distress. She has no wheezes. She has no rales.  Genitourinary:  Genitourinary Comments: External Hemorid noted without bleeding     Assessment & Plan:   See encounters tab for problem based medical decision making.    Patient discussed with Dr. Criselda PeachesMullen

## 2017-05-01 NOTE — Assessment & Plan Note (Signed)
Assessment:  Hemorrhoidal bleeding Patient had a colonoscopy on 01/2017 that showed a anal fissure and external hemorrhoids. Recommendations at that time included diltiazem 2%/lidocaine 5% ointment to anorectum. Patient states she has tried every cream that she has been prescribed and  does not help with pain.  Patient reports continued pain and bleeding and would like to discuss surgical options.  She goes through 4 pads a day due to bleeding.  She denies alcohol use.  On exam external hemorrhoids noted without bleeding. Will refer to surgery    Plan -CBC -surgery referral -Continue ointment to anorectum -short course of tramadol for pain -follow up with PCP next week

## 2017-05-01 NOTE — Assessment & Plan Note (Signed)
Assessment: History of hypokalemia Patient was recently admitted on 04/12/17 for symptomatic hypokalemia. She is currently taking 40kdur BID.  She denies diarrhea.  Will repeat cmet today.  Plan -cmet

## 2017-05-01 NOTE — Patient Instructions (Signed)
[name],  Is a pleasure seeing you today. Please follow up with Dr. Mikey BussingHoffman next week.

## 2017-05-01 NOTE — Assessment & Plan Note (Addendum)
Assessment: Bilateral leg swelling Patient states that after discharge from the hospital she has had continuous leg swelling.  She states that her legs feel tight and uncomfortable. She denies any orthopnea or dyspnea on exertion. On exam patient has 2+ pitting edema with chronic venous stasis changes.  Patient likely received fluids in the hospital and due to low albumin is retaining fluid in her legs. Will check a bmet and if potassium stable we'll give a dose of Lasix to help with fluid removal.  Plan -Bmet -Compression stockings -Give a dose of Lasix if potassium is okay  Addendum:  Potassium ok at 5.  Will give lasix to take for 3 days straight, then as needed for leg swelling

## 2017-05-02 ENCOUNTER — Other Ambulatory Visit: Payer: Self-pay | Admitting: Internal Medicine

## 2017-05-02 LAB — CMP14 + ANION GAP
A/G RATIO: 0.5 — AB (ref 1.2–2.2)
ALT: 11 IU/L (ref 0–32)
AST: 43 IU/L — ABNORMAL HIGH (ref 0–40)
Albumin: 2.1 g/dL — ABNORMAL LOW (ref 3.5–5.5)
Alkaline Phosphatase: 198 IU/L — ABNORMAL HIGH (ref 39–117)
Anion Gap: 10 mmol/L (ref 10.0–18.0)
BILIRUBIN TOTAL: 0.4 mg/dL (ref 0.0–1.2)
BUN/Creatinine Ratio: 5 — ABNORMAL LOW (ref 9–23)
BUN: 4 mg/dL — AB (ref 6–24)
CHLORIDE: 105 mmol/L (ref 96–106)
CO2: 19 mmol/L — ABNORMAL LOW (ref 20–29)
Calcium: 8 mg/dL — ABNORMAL LOW (ref 8.7–10.2)
Creatinine, Ser: 0.8 mg/dL (ref 0.57–1.00)
GFR calc Af Amer: 95 mL/min/{1.73_m2} (ref >59)
GFR calc non Af Amer: 83 mL/min/{1.73_m2} (ref >59)
GLUCOSE: 78 mg/dL (ref 65–99)
Globulin, Total: 4.4 g/dL (ref 1.5–4.5)
POTASSIUM: 5 mmol/L (ref 3.5–5.2)
Sodium: 134 mmol/L (ref 134–144)
Total Protein: 6.5 g/dL (ref 6.0–8.5)

## 2017-05-02 LAB — CBC
HEMOGLOBIN: 8.3 g/dL — AB (ref 11.1–15.9)
Hematocrit: 26.7 % — ABNORMAL LOW (ref 34.0–46.6)
MCH: 27.1 pg (ref 26.6–33.0)
MCHC: 31.1 g/dL — ABNORMAL LOW (ref 31.5–35.7)
MCV: 87 fL (ref 79–97)
Platelets: 372 10*3/uL (ref 150–379)
RBC: 3.06 x10E6/uL — ABNORMAL LOW (ref 3.77–5.28)
RDW: 15.7 % — ABNORMAL HIGH (ref 12.3–15.4)
WBC: 5.7 10*3/uL (ref 3.4–10.8)

## 2017-05-02 MED ORDER — FUROSEMIDE 40 MG PO TABS: 40.0000 mg | ORAL_TABLET | Freq: Every day | ORAL | 0 refills | Status: DC | PRN

## 2017-05-02 NOTE — Progress Notes (Signed)
Internal Medicine Clinic Attending  Case discussed with Dr. Hoffman at the time of the visit.  We reviewed the resident's history and exam and pertinent patient test results.  I agree with the assessment, diagnosis, and plan of care documented in the resident's note.  

## 2017-05-03 ENCOUNTER — Ambulatory Visit: Payer: Managed Care, Other (non HMO)

## 2017-05-08 NOTE — Addendum Note (Signed)
Addended by: Neomia DearPOWERS, Sufian Ravi E on: 05/08/2017 04:15 PM   Modules accepted: Orders

## 2017-05-09 DIAGNOSIS — K700 Alcoholic fatty liver: Secondary | ICD-10-CM | POA: Insufficient documentation

## 2017-05-09 NOTE — Progress Notes (Signed)
Subjective:  HPI: Sharon Kent is a 57 y.o. female who presents for f/u of chronic alcoholic pancreatitis, HTN.  Please see Assessment and Plan below for the status of her chronic medical problems.  Review of Systems: Review of Systems  Constitutional: Positive for malaise/fatigue. Negative for fever.  Respiratory: Negative for cough and shortness of breath.   Cardiovascular: Positive for leg swelling. Negative for chest pain.  Gastrointestinal: Positive for abdominal pain. Negative for diarrhea.  Genitourinary: Negative for dysuria.  Musculoskeletal: Negative for falls.    Objective:  Physical Exam: Vitals:   05/10/17 0858  BP: 114/74  Pulse: 88  Temp: (!) 97.5 F (36.4 C)  TempSrc: Oral  SpO2: 100%  Weight: 151 lb 9.6 oz (68.8 kg)  Height: 5\' 6"  (1.676 m)   Physical Exam  Constitutional: She is well-developed, well-nourished, and in no distress.  Cardiovascular: Normal rate and regular rhythm.  Pulmonary/Chest: Effort normal and breath sounds normal. No respiratory distress. She has no wheezes. She has no rales.  Abdominal: Soft. Bowel sounds are normal. She exhibits distension, fluid wave and ascites.  Ventral hernia, central healed abdominal scar.  POCUS confirmed moderate ascites present.  Musculoskeletal: She exhibits edema (2+ pitting edema bilaterally to knees).  Nursing note and vitals reviewed.  Assessment & Plan:  Alcohol use disorder, severe, dependence (HCC) HPI: Sharon Kent has frequently reported alcohol abstience however UDS results (collected for checking Tramadol presence) has continued to show the presence of alcohol and its metabolites.Despite severe medical problems she continues to drink. She has reported ETOH cessation at every recent visit incuding last week with Sharon Kent.  I confronted her today, she reports that she drank the night before that test with a friend but that was a "one time thing" she does not feel she has a problem.   A:  Alcohol use disorder, severe, dependence  P: She obviously is underreporting her ETOH use, I offered referral for substance use disorder but she declined.  I wished to check for urine etoh use unfortunately not enough urine sample was given.   Metabolic acidosis, normal anion gap (NAG) HPI: NAGMA was first noted a few months ago in the hospital.  Urine studies showed a postive anion gap, but picute is not completely consistent with a distal RTA.  Although she may be developing cirrhosis which may put her at risk.  A: NAGMA  P: Recheck bicarb.  Thoracic aortic atherosclerosis (HCC) Noted on CXR last year.  Lipid panel with LDL of 74.   Essential hypertension Well controlled, suspect she has developed cirrhosis.  Management with diuretics at this point.  Chronic alcoholic pancreatitis (HCC) HPI: Reports continued ETOH abstinence, this is not true however, several UDS have shown ETOH presence- discussed seperately in ETOH use disorder.  She does report using creon approprietly.  A: Chronic alcoholic pancreatitis  P:Continue creon Tramadol for abdominal pain, discussed she has to quit ETOH use.  Alcoholic cirrhosis of liver with ascites (HCC) HPI:  C/O abdominal bloating today, LE edema (improved slightly with lasix), and pain from hemorrhoids.  Previous abdominal imaging has shown hepatic steatosis.  A: Cirrhosis  P:I suspect clinically she has progressed to alcoholic cirrhosis.  On abdominal exam suspected ascites, confirmed with U/S and preformed diagnositic paracentesis today.  F/u Results- suspect ascites is from alcoholic cirrhosis  Start Spironolactone 100mg  daily,continue lasix 40mg  daily. Check CMP today, K+ 4.8 given we are starting spironolactone will have her hold potassium supplementation this week and repeat BMP in 1  week.  Leg swelling HPI: Leg swelling has improved some per her report on lasix 40mg  daily.  A: Leg swelling due to hypoalbuminemia due to  cirrhosis  P: Add spironolactone 100mg  daily continue lasix 40mg  daily.  Purpura, nonthrombopenic (HCC) Continue to have some nontrhombopenic purupura on bilateral UE.  Pancreatic insufficiency Continue creon.  Hypokalemia Has been on KDur BID, potassium back up and stable.  Suspect hypokalemia was due to ETOH abuse and possibly cirrhosis, distal RTA possible.  Rechecked K+ today and stable, starting spironolacotne, will hold potassium and repeat BMP next week.  External hemorrhoids HPI: Still c/o pain and dyscomfort, has appointment with surgery for consultation next week.  Using tucks, anusol, lidocaine (just ran out requesting refill).  A: painful external hemorrhoids  P: Continue current management, has appointment with surgery.   Medications Ordered Meds ordered this encounter  Medications  . spironolactone (ALDACTONE) 100 MG tablet    Sig: Take 1 tablet (100 mg total) by mouth daily.    Dispense:  30 tablet    Refill:  3  . traMADol (ULTRAM) 50 MG tablet    Sig: Take 1 tablet (50 mg total) by mouth every 12 (twelve) hours as needed.    Dispense:  30 tablet    Refill:  1  . lidocaine (XYLOCAINE) 5 % ointment    Sig: Apply topically 2 (two) times daily as needed for mild pain.    Dispense:  50 g    Refill:  2   Other Orders Orders Placed This Encounter  Procedures  . CMP14 + Anion Gap  . CBC with Diff  . Gamma GT, GGT ([id number])  . Protime-INR  . Sodium, Urine  . Potassium, Urine  . Chloride, urine, random  . Urinalysis, Reflex Microscopic  . BMP8+Anion Gap    Standing Status:   Future    Standing Expiration Date:   05/10/2018  . Body fluid cell count with differential  . Albumin, fluid - peritoneal or pleural  . Lactate dehydrogenase, fluid - peritoneal or pleural  . Glucose, Body Fluid Other  . Protein, pleural or peritoneal fluid  . Pathologist smear review  . Iron and IBC ([id number])    Standing Status:   Future    Standing Expiration Date:    05/11/2018  . Ferritin    Standing Status:   Future    Standing Expiration Date:   05/11/2018  . Vitamin B12    Standing Status:   Future    Standing Expiration Date:   05/11/2018  . RBC Folate    Standing Status:   Future    Standing Expiration Date:   05/11/2018  . PR SURGICAL SUPPLIES   Follow Up: Return in about 2 months (around 07/08/2017).

## 2017-05-10 ENCOUNTER — Other Ambulatory Visit: Payer: Self-pay

## 2017-05-10 ENCOUNTER — Ambulatory Visit (INDEPENDENT_AMBULATORY_CARE_PROVIDER_SITE_OTHER): Payer: Managed Care, Other (non HMO) | Admitting: Internal Medicine

## 2017-05-10 VITALS — BP 114/74 | HR 88 | Temp 97.5°F | Ht 66.0 in | Wt 151.6 lb

## 2017-05-10 DIAGNOSIS — M7989 Other specified soft tissue disorders: Secondary | ICD-10-CM

## 2017-05-10 DIAGNOSIS — Z79899 Other long term (current) drug therapy: Secondary | ICD-10-CM

## 2017-05-10 DIAGNOSIS — K860 Alcohol-induced chronic pancreatitis: Secondary | ICD-10-CM

## 2017-05-10 DIAGNOSIS — K644 Residual hemorrhoidal skin tags: Secondary | ICD-10-CM | POA: Diagnosis not present

## 2017-05-10 DIAGNOSIS — E872 Acidosis, unspecified: Secondary | ICD-10-CM

## 2017-05-10 DIAGNOSIS — K7030 Alcoholic cirrhosis of liver without ascites: Secondary | ICD-10-CM

## 2017-05-10 DIAGNOSIS — E876 Hypokalemia: Secondary | ICD-10-CM

## 2017-05-10 DIAGNOSIS — D649 Anemia, unspecified: Secondary | ICD-10-CM

## 2017-05-10 DIAGNOSIS — E8809 Other disorders of plasma-protein metabolism, not elsewhere classified: Secondary | ICD-10-CM | POA: Diagnosis not present

## 2017-05-10 DIAGNOSIS — K7031 Alcoholic cirrhosis of liver with ascites: Secondary | ICD-10-CM

## 2017-05-10 DIAGNOSIS — F1020 Alcohol dependence, uncomplicated: Secondary | ICD-10-CM

## 2017-05-10 DIAGNOSIS — D692 Other nonthrombocytopenic purpura: Secondary | ICD-10-CM | POA: Diagnosis not present

## 2017-05-10 DIAGNOSIS — K700 Alcoholic fatty liver: Secondary | ICD-10-CM

## 2017-05-10 DIAGNOSIS — I700 Atherosclerosis of aorta: Secondary | ICD-10-CM

## 2017-05-10 DIAGNOSIS — K439 Ventral hernia without obstruction or gangrene: Secondary | ICD-10-CM

## 2017-05-10 DIAGNOSIS — F17210 Nicotine dependence, cigarettes, uncomplicated: Secondary | ICD-10-CM

## 2017-05-10 DIAGNOSIS — I10 Essential (primary) hypertension: Secondary | ICD-10-CM | POA: Diagnosis not present

## 2017-05-10 DIAGNOSIS — Z79891 Long term (current) use of opiate analgesic: Secondary | ICD-10-CM

## 2017-05-10 DIAGNOSIS — K8689 Other specified diseases of pancreas: Secondary | ICD-10-CM

## 2017-05-10 LAB — BODY FLUID CELL COUNT WITH DIFFERENTIAL
Eos, Fluid: NONE SEEN %
LYMPHS FL: 8 %
Monocyte-Macrophage-Serous Fluid: 84 % (ref 50–90)
NEUTROPHIL FLUID: 8 % (ref 0–25)
WBC FLUID: 27 uL (ref 0–1000)

## 2017-05-10 LAB — LACTATE DEHYDROGENASE, PLEURAL OR PERITONEAL FLUID: LD FL: 56 U/L — AB (ref 3–23)

## 2017-05-10 LAB — TOXASSURE SELECT,+ANTIDEPR,UR

## 2017-05-10 LAB — PROTIME-INR
INR: 1.21
Prothrombin Time: 15.2 seconds (ref 11.4–15.2)

## 2017-05-10 LAB — PROTEIN, PLEURAL OR PERITONEAL FLUID: Total protein, fluid: <3 g/dL

## 2017-05-10 LAB — SPECIMEN STATUS REPORT

## 2017-05-10 MED ORDER — TRAMADOL HCL 50 MG PO TABS: 50.0000 mg | ORAL_TABLET | Freq: Two times a day (BID) | ORAL | 1 refills | Status: DC | PRN

## 2017-05-10 MED ORDER — SPIRONOLACTONE 100 MG PO TABS: 100.0000 mg | ORAL_TABLET | Freq: Every day | ORAL | 3 refills | Status: DC

## 2017-05-10 MED ORDER — LIDOCAINE 5 % EX OINT: TOPICAL_OINTMENT | Freq: Two times a day (BID) | CUTANEOUS | 2 refills | Status: AC | PRN

## 2017-05-10 NOTE — Progress Notes (Signed)
Paracentesis Procedure Note  Indications: Ascites   Procedure Details  Informed consent was obtained after explanation of the risks and benefits of the procedure, refer to the consent documentation.  Time-out was performed immediately prior to the procedure.  The head of the bed was placed 30-45 degrees above level and the meniscus of the ascites was evaluated by bedside ultrasound and a large area of ascitic fluid was identified in the right lower quadrant.   Local anesthesia with 1 percent lidocaine was introduced subcutaneously then deep to the skin until the parietal peritoneum was anesthetized. A parcentesis needle was introduced into this site until ascitic fluid was encountered.  Approximately 60 mL of ascitic fluid was removed and the needle was removed with minimal bleeding.  A sterile bandage was placed after holding pressure.   Findings: 60mL of yellow ascites fluid was obtained.  The ascites fluid was sent for LDH, protein, albumin.         Condition:   The patient tolerated the procedure well and remains in the same condition as pre-procedure.  Complications: None; patient tolerated the procedure well.

## 2017-05-10 NOTE — Patient Instructions (Signed)
I want you to start taking 100mg  of spironolactone in addition to the 40mg  of lasix.  I will call about your potassium levels and further instructions about how to take that.

## 2017-05-10 NOTE — Assessment & Plan Note (Addendum)
HPI: Ms Sharon Kent has frequently reported alcohol abstience however UDS results (collected for checking Tramadol presence) has continued to show the presence of alcohol and its metabolites.Despite severe medical problems she continues to drink. She has reported ETOH cessation at every recent visit incuding last week with Dr Mila HomerJ Darryle Dennie.  I confronted her today, she reports that she drank the night before that test with a friend but that was a "one time thing" she does not feel she has a problem.   A: Alcohol use disorder, severe, dependence  P: She obviously is underreporting her ETOH use, I offered referral for substance use disorder but she declined.  I wished to check for urine etoh use unfortunately not enough urine sample was given.

## 2017-05-10 NOTE — Assessment & Plan Note (Signed)
HPI: NAGMA was first noted a few months ago in the hospital.  Urine studies showed a postive anion gap, but picute is not completely consistent with a distal RTA.  Although she may be developing cirrhosis which may put her at risk.  A: NAGMA  P: Recheck bicarb.

## 2017-05-11 LAB — ALBUMIN, FLUID (OTHER): Albumin, Body Fluid Other: 0.6 g/dL

## 2017-05-11 LAB — CMP14 + ANION GAP
ALBUMIN: 2.3 g/dL — AB (ref 3.5–5.5)
ALT: 10 IU/L (ref 0–32)
ANION GAP: 15 mmol/L (ref 10.0–18.0)
AST: 36 IU/L (ref 0–40)
Albumin/Globulin Ratio: 0.5 — ABNORMAL LOW (ref 1.2–2.2)
Alkaline Phosphatase: 197 IU/L — ABNORMAL HIGH (ref 39–117)
BUN / CREAT RATIO: 9 (ref 9–23)
BUN: 8 mg/dL (ref 6–24)
Bilirubin Total: 0.4 mg/dL (ref 0.0–1.2)
CO2: 20 mmol/L (ref 20–29)
CREATININE: 0.85 mg/dL (ref 0.57–1.00)
Calcium: 8 mg/dL — ABNORMAL LOW (ref 8.7–10.2)
Chloride: 100 mmol/L (ref 96–106)
GFR calc non Af Amer: 77 mL/min/{1.73_m2} (ref >59)
GFR, EST AFRICAN AMERICAN: 89 mL/min/{1.73_m2} (ref >59)
GLOBULIN, TOTAL: 4.4 g/dL (ref 1.5–4.5)
Glucose: 87 mg/dL (ref 65–99)
Potassium: 4.8 mmol/L (ref 3.5–5.2)
SODIUM: 135 mmol/L (ref 134–144)
TOTAL PROTEIN: 6.7 g/dL (ref 6.0–8.5)

## 2017-05-11 LAB — CHLORIDE, URINE, RANDOM: Chloride, Ur: 72 mmol/L

## 2017-05-11 LAB — CBC WITH DIFFERENTIAL/PLATELET
BASOS: 0 %
Basophils Absolute: 0 10*3/uL (ref 0.0–0.2)
EOS (ABSOLUTE): 0.1 10*3/uL (ref 0.0–0.4)
EOS: 1 %
HEMOGLOBIN: 8.7 g/dL — AB (ref 11.1–15.9)
Hematocrit: 28.1 % — ABNORMAL LOW (ref 34.0–46.6)
Immature Grans (Abs): 0 10*3/uL (ref 0.0–0.1)
Immature Granulocytes: 1 %
LYMPHS ABS: 1.9 10*3/uL (ref 0.7–3.1)
Lymphs: 26 %
MCH: 27.7 pg (ref 26.6–33.0)
MCHC: 31 g/dL — AB (ref 31.5–35.7)
MCV: 90 fL (ref 79–97)
MONOS ABS: 0.8 10*3/uL (ref 0.1–0.9)
Monocytes: 11 %
NEUTROS ABS: 4.2 10*3/uL (ref 1.4–7.0)
Neutrophils: 61 %
Platelets: 421 10*3/uL — ABNORMAL HIGH (ref 150–379)
RBC: 3.14 x10E6/uL — ABNORMAL LOW (ref 3.77–5.28)
RDW: 16.4 % — AB (ref 12.3–15.4)
WBC: 7 10*3/uL (ref 3.4–10.8)

## 2017-05-11 LAB — GLUCOSE, BODY FLUID OTHER: GLUCOSE, BODY FLUID OTHER: 98 mg/dL

## 2017-05-11 LAB — URINALYSIS, ROUTINE W REFLEX MICROSCOPIC
BILIRUBIN UA: NEGATIVE
Glucose, UA: NEGATIVE
KETONES UA: NEGATIVE
Leukocytes, UA: NEGATIVE
NITRITE UA: NEGATIVE
PH UA: 6.5 (ref 5.0–7.5)
Protein, UA: NEGATIVE
RBC, UA: NEGATIVE
SPEC GRAV UA: 1.019 (ref 1.005–1.030)
UUROB: 1 mg/dL (ref 0.2–1.0)

## 2017-05-11 LAB — SODIUM, URINE, RANDOM: Sodium, Ur: 55 mmol/L

## 2017-05-11 LAB — GAMMA GT: GGT: 110 IU/L — ABNORMAL HIGH (ref 0–60)

## 2017-05-11 LAB — POTASSIUM, URINE, RANDOM: Potassium Urine: 99.6 mmol/L

## 2017-05-11 LAB — PATHOLOGIST SMEAR REVIEW

## 2017-05-11 NOTE — Assessment & Plan Note (Signed)
Has been on KDur BID, potassium back up and stable.  Suspect hypokalemia was due to ETOH abuse and possibly cirrhosis, distal RTA possible.  Rechecked K+ today and stable, starting spironolacotne, will hold potassium and repeat BMP next week.

## 2017-05-11 NOTE — Assessment & Plan Note (Addendum)
HPI:  C/O abdominal bloating today, LE edema (improved slightly with lasix), and pain from hemorrhoids.  Previous abdominal imaging has shown hepatic steatosis.  A: Cirrhosis  P:I suspect clinically she has progressed to alcoholic cirrhosis.  On abdominal exam suspected ascites, confirmed with U/S and preformed diagnositic paracentesis today.  F/u Results- suspect ascites is from alcoholic cirrhosis  Start Spironolactone 100mg  daily,continue lasix 40mg  daily. Check CMP today, K+ 4.8 given we are starting spironolactone will have her hold potassium supplementation this week and repeat BMP in 1 week.

## 2017-05-11 NOTE — Assessment & Plan Note (Signed)
HPI: Reports continued ETOH abstinence, this is not true however, several UDS have shown ETOH presence- discussed seperately in ETOH use disorder.  She does report using creon approprietly.  A: Chronic alcoholic pancreatitis  P:Continue creon Tramadol for abdominal pain, discussed she has to quit ETOH use.

## 2017-05-11 NOTE — Assessment & Plan Note (Signed)
Well controlled, suspect she has developed cirrhosis.  Management with diuretics at this point.

## 2017-05-11 NOTE — Assessment & Plan Note (Signed)
Noted on CXR last year.  Lipid panel with LDL of 74.

## 2017-05-11 NOTE — Assessment & Plan Note (Signed)
HPI: Still c/o pain and dyscomfort, has appointment with surgery for consultation next week.  Using tucks, anusol, lidocaine (just ran out requesting refill).  A: painful external hemorrhoids  P: Continue current management, has appointment with surgery.

## 2017-05-11 NOTE — Assessment & Plan Note (Signed)
Continue to have some nontrhombopenic purupura on bilateral UE.

## 2017-05-11 NOTE — Assessment & Plan Note (Signed)
Continue creon.  

## 2017-05-11 NOTE — Assessment & Plan Note (Signed)
HPI: Leg swelling has improved some per her report on lasix 40mg  daily.  A: Leg swelling due to hypoalbuminemia due to cirrhosis  P: Add spironolactone 100mg  daily continue lasix 40mg  daily.

## 2017-05-14 ENCOUNTER — Encounter: Payer: Self-pay | Admitting: Internal Medicine

## 2017-05-18 ENCOUNTER — Other Ambulatory Visit (INDEPENDENT_AMBULATORY_CARE_PROVIDER_SITE_OTHER): Payer: Managed Care, Other (non HMO)

## 2017-05-18 ENCOUNTER — Encounter: Payer: Self-pay | Admitting: Internal Medicine

## 2017-05-18 DIAGNOSIS — K7030 Alcoholic cirrhosis of liver without ascites: Secondary | ICD-10-CM | POA: Diagnosis not present

## 2017-05-18 DIAGNOSIS — D649 Anemia, unspecified: Secondary | ICD-10-CM

## 2017-05-18 DIAGNOSIS — I10 Essential (primary) hypertension: Secondary | ICD-10-CM | POA: Diagnosis not present

## 2017-05-18 DIAGNOSIS — N2589 Other disorders resulting from impaired renal tubular function: Secondary | ICD-10-CM | POA: Insufficient documentation

## 2017-05-19 LAB — BMP8+ANION GAP
Anion Gap: 16 mmol/L (ref 10.0–18.0)
BUN/Creatinine Ratio: 13 (ref 9–23)
BUN: 12 mg/dL (ref 6–24)
CALCIUM: 8.3 mg/dL — AB (ref 8.7–10.2)
CO2: 21 mmol/L (ref 20–29)
Chloride: 101 mmol/L (ref 96–106)
Creatinine, Ser: 0.92 mg/dL (ref 0.57–1.00)
GFR, EST AFRICAN AMERICAN: 80 mL/min/{1.73_m2} (ref >59)
GFR, EST NON AFRICAN AMERICAN: 70 mL/min/{1.73_m2} (ref >59)
Glucose: 93 mg/dL (ref 65–99)
POTASSIUM: 3.5 mmol/L (ref 3.5–5.2)
Sodium: 138 mmol/L (ref 134–144)

## 2017-05-19 LAB — VITAMIN B12: VITAMIN B 12: 563 pg/mL (ref 232–1245)

## 2017-05-19 LAB — IRON AND TIBC
IRON SATURATION: 10 % — AB (ref 15–55)
Iron: 19 ug/dL — ABNORMAL LOW (ref 27–159)
TIBC: 197 ug/dL — AB (ref 250–450)
UIBC: 178 ug/dL (ref 131–425)

## 2017-05-19 LAB — FERRITIN: FERRITIN: 87 ng/mL (ref 15–150)

## 2017-05-21 LAB — FOLATE RBC
FOLATE, RBC: 1587 ng/mL (ref >498)
Folate, Hemolysate: 403.2 ng/mL
Hematocrit: 25.4 % — ABNORMAL LOW (ref 34.0–46.6)

## 2017-05-22 ENCOUNTER — Other Ambulatory Visit: Payer: Self-pay | Admitting: Internal Medicine

## 2017-05-22 MED ORDER — FUROSEMIDE 40 MG PO TABS: 40.0000 mg | ORAL_TABLET | Freq: Every day | ORAL | 0 refills | Status: DC | PRN

## 2017-06-25 ENCOUNTER — Other Ambulatory Visit: Payer: Self-pay

## 2017-06-25 DIAGNOSIS — J45909 Unspecified asthma, uncomplicated: Secondary | ICD-10-CM

## 2017-06-25 NOTE — Telephone Encounter (Signed)
Requesting 90 days supply on all meds.  

## 2017-06-26 MED ORDER — SODIUM BICARBONATE 650 MG PO TABS: 650.0000 mg | ORAL_TABLET | Freq: Three times a day (TID) | ORAL | 1 refills | Status: DC

## 2017-06-26 MED ORDER — ALBUTEROL SULFATE 108 (90 BASE) MCG/ACT IN AEPB: 2.0000 | INHALATION_SPRAY | RESPIRATORY_TRACT | 1 refills | Status: DC | PRN

## 2017-06-26 MED ORDER — PHENYLEPHRINE IN HARD FAT 0.25 % RE SUPP: 1.0000 | Freq: Two times a day (BID) | RECTAL | 1 refills | Status: AC

## 2017-06-26 MED ORDER — PANCRELIPASE (LIP-PROT-AMYL) 24000-76000 UNITS PO CPEP: 72000.0000 [IU] | ORAL_CAPSULE | Freq: Three times a day (TID) | ORAL | 1 refills | Status: DC

## 2017-06-26 MED ORDER — PANTOPRAZOLE SODIUM 40 MG PO TBEC: 40.0000 mg | DELAYED_RELEASE_TABLET | Freq: Every day | ORAL | 1 refills | Status: DC

## 2017-06-26 MED ORDER — SPIRONOLACTONE 100 MG PO TABS: 100.0000 mg | ORAL_TABLET | Freq: Every day | ORAL | 1 refills | Status: DC

## 2017-06-26 MED ORDER — POTASSIUM CHLORIDE CRYS ER 20 MEQ PO TBCR: 40.0000 meq | EXTENDED_RELEASE_TABLET | Freq: Two times a day (BID) | ORAL | 1 refills | Status: DC

## 2017-06-26 MED ORDER — FUROSEMIDE 40 MG PO TABS: 40.0000 mg | ORAL_TABLET | Freq: Every day | ORAL | 1 refills | Status: DC | PRN

## 2017-07-11 NOTE — Progress Notes (Addendum)
Subjective:  HPI: Sharon Kent is a 57 y.o. female who presents for f/u of cirrhosis/ pancreaitits  Please see Assessment and Plan below for the status of her chronic medical problems.  Review of Systems: Review of Systems  Constitutional: Negative for fever.  Eyes: Negative for blurred vision.  Cardiovascular: Negative for chest pain.  Gastrointestinal: Negative for abdominal pain, constipation and vomiting.  Genitourinary: Negative for dysuria and hematuria.  Musculoskeletal: Negative for myalgias.  Psychiatric/Behavioral: Negative for depression and substance abuse.    Objective:  Physical Exam: Vitals:   07/12/17 0841  BP: 127/84  Pulse: 75  Temp: (!) 97.4 F (36.3 C)  TempSrc: Oral  SpO2: 100%  Weight: 133 lb 8 oz (60.6 kg)  Height: 5\' 6"  (1.676 m)   Physical Exam  Constitutional: She is well-developed, well-nourished, and in no distress.  Cardiovascular: Normal rate and regular rhythm.  Pulmonary/Chest: Breath sounds normal. No respiratory distress. She has no wheezes.  Abdominal: She exhibits no distension and no mass. There is no tenderness. There is no rebound.  Large reducable left side ventral hernia, midline surgical scar  Musculoskeletal: She exhibits no edema.  Psychiatric: Affect normal.  Nursing note and vitals reviewed.  Assessment & Plan:  Chronic alcoholic pancreatitis (HCC) Denies any ETOH use since before our last visit. Last objective ETOH use was UDS from 05/01/17. Denies any current issues.  Alcoholic cirrhosis of liver with ascites (HCC) HPI: Notes mostly good adherence with lasix and spironolactone, she notes when taking these her leg swelling and abdominal swelling and cramping has improved.  Med rec completed today and is uptodate. Has had some trouble with insurance>> hours dropped at work, could not afford COBRA.  A: Alcoholic cirrhosis of liver w/ ascites  P: Continue Lasix 40mg  daily and Spironolactone 100mg  daily Referral to  pharm to ensure affordability of medications Check CMP, INR  Type I RTA HPI: From last blood work most consistent cause of her low bicarb would be distal type 1 RTA, notes she is only taking bicarb pill once a day.  Of note she has no dysuria or new urianry frequency  A: Type 1 RTA  P: Check bicarb, also UA for pH  Chronic pain syndrome She takes tramadol up to BID as needed for chronic pancreatitis pain.  She reports continued use of this, last taken within the last 3-4 days. Notes this is helping.  Continue creon for pancreatitis. Will continue low dose tramadol conservatively. Check UDS.  Pancreatic insufficiency Taking creon. Wt has fluctuated, by I think partially due to volume overload from cirrhosis.  Will need to continue creon.  Has had financial and insurance difficulty, will have her see clinical pharmacy for assistance.   Medications Ordered No orders of the defined types were placed in this encounter.  Other Orders Orders Placed This Encounter  Procedures  . CMP14 + Anion Gap  . CBC with Diff  . Urinalysis, Reflex Microscopic  . ToxAssure Select,+Antidepr,UR  . Protime-INR  . Amb Referral to Clinical Pharmacist    Referral Priority:   Routine    Referral Type:   Consultation    Referred to Provider:   Mliss Fritz, PharmD    Number of Visits Requested:   1   Follow Up: Return in about 3 months (around 10/11/2017).  ADDENDUM: Labs show improved INR, Albumin, SCr and BUN slightly increased, bicarb still at 19 and urine pH 6.5.   Will have patient decrease diuretics to 50mg  Spironolactone daily and 20mg  of lasix  daily, increase her sodium bicarb to TID (as previously prescribed).

## 2017-07-12 ENCOUNTER — Encounter: Payer: Self-pay | Admitting: Internal Medicine

## 2017-07-12 ENCOUNTER — Ambulatory Visit (INDEPENDENT_AMBULATORY_CARE_PROVIDER_SITE_OTHER): Payer: PRIVATE HEALTH INSURANCE | Admitting: Internal Medicine

## 2017-07-12 ENCOUNTER — Other Ambulatory Visit: Payer: Self-pay

## 2017-07-12 VITALS — BP 127/84 | HR 75 | Temp 97.4°F | Ht 66.0 in | Wt 133.5 lb

## 2017-07-12 DIAGNOSIS — I10 Essential (primary) hypertension: Secondary | ICD-10-CM

## 2017-07-12 DIAGNOSIS — K8689 Other specified diseases of pancreas: Secondary | ICD-10-CM

## 2017-07-12 DIAGNOSIS — K860 Alcohol-induced chronic pancreatitis: Secondary | ICD-10-CM

## 2017-07-12 DIAGNOSIS — K7031 Alcoholic cirrhosis of liver with ascites: Secondary | ICD-10-CM | POA: Diagnosis not present

## 2017-07-12 DIAGNOSIS — K439 Ventral hernia without obstruction or gangrene: Secondary | ICD-10-CM | POA: Diagnosis not present

## 2017-07-12 DIAGNOSIS — Z79899 Other long term (current) drug therapy: Secondary | ICD-10-CM

## 2017-07-12 DIAGNOSIS — N2589 Other disorders resulting from impaired renal tubular function: Secondary | ICD-10-CM

## 2017-07-12 DIAGNOSIS — Z79891 Long term (current) use of opiate analgesic: Secondary | ICD-10-CM

## 2017-07-12 DIAGNOSIS — G894 Chronic pain syndrome: Secondary | ICD-10-CM

## 2017-07-12 LAB — PROTIME-INR
INR: 1.08
Prothrombin Time: 13.9 seconds (ref 11.4–15.2)

## 2017-07-12 NOTE — Assessment & Plan Note (Signed)
Taking creon. Wt has fluctuated, by I think partially due to volume overload from cirrhosis.  Will need to continue creon.  Has had financial and insurance difficulty, will have her see clinical pharmacy for assistance.

## 2017-07-12 NOTE — Assessment & Plan Note (Signed)
Denies any ETOH use since before our last visit. Last objective ETOH use was UDS from 05/01/17. Denies any current issues.

## 2017-07-12 NOTE — Patient Instructions (Signed)
I will call you with the results of your blood work.  

## 2017-07-12 NOTE — Assessment & Plan Note (Signed)
HPI: Notes mostly good adherence with lasix and spironolactone, she notes when taking these her leg swelling and abdominal swelling and cramping has improved.  Med rec completed today and is uptodate. Has had some trouble with insurance>> hours dropped at work, could not afford COBRA.  A: Alcoholic cirrhosis of liver w/ ascites  P: Continue Lasix 40mg  daily and Spironolactone 100mg  daily Referral to pharm to ensure affordability of medications Check CMP, INR

## 2017-07-12 NOTE — Assessment & Plan Note (Signed)
She takes tramadol up to BID as needed for chronic pancreatitis pain.  She reports continued use of this, last taken within the last 3-4 days. Notes this is helping.  Continue creon for pancreatitis. Will continue low dose tramadol conservatively. Check UDS.

## 2017-07-12 NOTE — Assessment & Plan Note (Signed)
HPI: From last blood work most consistent cause of her low bicarb would be distal type 1 RTA, notes she is only taking bicarb pill once a day.  Of note she has no dysuria or new urianry frequency  A: Type 1 RTA  P: Check bicarb, also UA for pH

## 2017-07-13 ENCOUNTER — Encounter: Payer: Self-pay | Admitting: Internal Medicine

## 2017-07-13 LAB — CMP14 + ANION GAP
A/G RATIO: 1 — AB (ref 1.2–2.2)
ALK PHOS: 174 IU/L — AB (ref 39–117)
ALT: 20 IU/L (ref 0–32)
AST: 29 IU/L (ref 0–40)
Albumin: 4.3 g/dL (ref 3.5–5.5)
Anion Gap: 18 mmol/L (ref 10.0–18.0)
BILIRUBIN TOTAL: 0.2 mg/dL (ref 0.0–1.2)
BUN/Creatinine Ratio: 24 — ABNORMAL HIGH (ref 9–23)
BUN: 26 mg/dL — AB (ref 6–24)
CHLORIDE: 97 mmol/L (ref 96–106)
CO2: 19 mmol/L — AB (ref 20–29)
Calcium: 10.4 mg/dL — ABNORMAL HIGH (ref 8.7–10.2)
Creatinine, Ser: 1.09 mg/dL — ABNORMAL HIGH (ref 0.57–1.00)
GFR calc Af Amer: 66 mL/min/{1.73_m2} (ref >59)
GFR calc non Af Amer: 57 mL/min/{1.73_m2} — ABNORMAL LOW (ref >59)
GLUCOSE: 105 mg/dL — AB (ref 65–99)
Globulin, Total: 4.2 g/dL (ref 1.5–4.5)
POTASSIUM: 4.3 mmol/L (ref 3.5–5.2)
Sodium: 134 mmol/L (ref 134–144)
Total Protein: 8.5 g/dL (ref 6.0–8.5)

## 2017-07-13 LAB — CBC WITH DIFFERENTIAL/PLATELET
BASOS ABS: 0 10*3/uL (ref 0.0–0.2)
Basos: 1 %
EOS (ABSOLUTE): 0.2 10*3/uL (ref 0.0–0.4)
Eos: 2 %
Hematocrit: 33.9 % — ABNORMAL LOW (ref 34.0–46.6)
Hemoglobin: 10.5 g/dL — ABNORMAL LOW (ref 11.1–15.9)
IMMATURE GRANS (ABS): 0 10*3/uL (ref 0.0–0.1)
Immature Granulocytes: 1 %
LYMPHS: 41 %
Lymphocytes Absolute: 2.6 10*3/uL (ref 0.7–3.1)
MCH: 27.6 pg (ref 26.6–33.0)
MCHC: 31 g/dL — AB (ref 31.5–35.7)
MCV: 89 fL (ref 79–97)
Monocytes Absolute: 0.4 10*3/uL (ref 0.1–0.9)
Monocytes: 7 %
Neutrophils Absolute: 3.1 10*3/uL (ref 1.4–7.0)
Neutrophils: 48 %
PLATELETS: 388 10*3/uL — AB (ref 150–379)
RBC: 3.8 x10E6/uL (ref 3.77–5.28)
RDW: 16.6 % — ABNORMAL HIGH (ref 12.3–15.4)
WBC: 6.3 10*3/uL (ref 3.4–10.8)

## 2017-07-13 LAB — URINALYSIS, ROUTINE W REFLEX MICROSCOPIC
BILIRUBIN UA: NEGATIVE
GLUCOSE, UA: NEGATIVE
Ketones, UA: NEGATIVE
Nitrite, UA: NEGATIVE
Protein, UA: NEGATIVE
RBC, UA: NEGATIVE
SPEC GRAV UA: 1.007 (ref 1.005–1.030)
Urobilinogen, Ur: 0.2 mg/dL (ref 0.2–1.0)
pH, UA: 6.5 (ref 5.0–7.5)

## 2017-07-13 LAB — MICROSCOPIC EXAMINATION: Casts: NONE SEEN /lpf

## 2017-07-18 LAB — TOXASSURE SELECT,+ANTIDEPR,UR

## 2017-07-30 ENCOUNTER — Encounter: Payer: Self-pay | Admitting: Internal Medicine

## 2017-07-31 ENCOUNTER — Ambulatory Visit: Payer: PRIVATE HEALTH INSURANCE | Admitting: Pharmacist

## 2017-08-02 ENCOUNTER — Other Ambulatory Visit: Payer: Self-pay | Admitting: *Deleted

## 2017-08-02 MED ORDER — SODIUM BICARBONATE 650 MG PO TABS: 650.0000 mg | ORAL_TABLET | Freq: Three times a day (TID) | ORAL | 1 refills | Status: DC

## 2017-08-06 ENCOUNTER — Ambulatory Visit: Payer: PRIVATE HEALTH INSURANCE | Admitting: Pharmacist

## 2017-08-14 ENCOUNTER — Ambulatory Visit: Payer: PRIVATE HEALTH INSURANCE | Admitting: Pharmacist

## 2017-08-21 ENCOUNTER — Ambulatory Visit: Payer: PRIVATE HEALTH INSURANCE | Admitting: Pharmacist

## 2017-08-23 ENCOUNTER — Telehealth: Payer: Self-pay | Admitting: *Deleted

## 2017-08-23 NOTE — Telephone Encounter (Addendum)
Information was sent through CoverMyMeds for [place] for Creon for patient.   Awaiting determination.  08/23/2017 10:56 AM Additional information was sent through CoverMyMeds Creon.  Will await determination within 3 business days.  Angelina Ok, RN 08/29/2017 10:42 AM

## 2017-08-30 ENCOUNTER — Encounter: Payer: Self-pay | Admitting: Internal Medicine

## 2017-08-30 ENCOUNTER — Other Ambulatory Visit: Payer: Self-pay

## 2017-08-30 ENCOUNTER — Ambulatory Visit (INDEPENDENT_AMBULATORY_CARE_PROVIDER_SITE_OTHER): Payer: PRIVATE HEALTH INSURANCE | Admitting: Internal Medicine

## 2017-08-30 VITALS — BP 136/87 | HR 79 | Temp 98.2°F | Ht 66.0 in | Wt 143.0 lb

## 2017-08-30 DIAGNOSIS — I10 Essential (primary) hypertension: Secondary | ICD-10-CM

## 2017-08-30 DIAGNOSIS — R5381 Other malaise: Secondary | ICD-10-CM

## 2017-08-30 DIAGNOSIS — K7030 Alcoholic cirrhosis of liver without ascites: Secondary | ICD-10-CM | POA: Diagnosis not present

## 2017-08-30 DIAGNOSIS — E872 Acidosis, unspecified: Secondary | ICD-10-CM

## 2017-08-30 DIAGNOSIS — R5383 Other fatigue: Secondary | ICD-10-CM

## 2017-08-30 DIAGNOSIS — K8689 Other specified diseases of pancreas: Secondary | ICD-10-CM | POA: Diagnosis not present

## 2017-08-30 DIAGNOSIS — Z79899 Other long term (current) drug therapy: Secondary | ICD-10-CM

## 2017-08-30 DIAGNOSIS — N2589 Other disorders resulting from impaired renal tubular function: Secondary | ICD-10-CM | POA: Diagnosis not present

## 2017-08-30 DIAGNOSIS — F1099 Alcohol use, unspecified with unspecified alcohol-induced disorder: Secondary | ICD-10-CM

## 2017-08-30 DIAGNOSIS — K439 Ventral hernia without obstruction or gangrene: Secondary | ICD-10-CM | POA: Diagnosis not present

## 2017-08-30 DIAGNOSIS — D500 Iron deficiency anemia secondary to blood loss (chronic): Secondary | ICD-10-CM

## 2017-08-30 DIAGNOSIS — K860 Alcohol-induced chronic pancreatitis: Secondary | ICD-10-CM

## 2017-08-30 DIAGNOSIS — F1020 Alcohol dependence, uncomplicated: Secondary | ICD-10-CM

## 2017-08-30 DIAGNOSIS — Z79891 Long term (current) use of opiate analgesic: Secondary | ICD-10-CM

## 2017-08-30 DIAGNOSIS — K7031 Alcoholic cirrhosis of liver with ascites: Secondary | ICD-10-CM

## 2017-08-31 NOTE — Assessment & Plan Note (Signed)
HPI: No admissions to the hospital or ED visits since January overall she feels she is doing well she is only had some mild intermittent abdominal pain tramadol has been helping this.  She has been trying to avoid alcohol she notes she has occasionally used some alcohol, but has significantly reduced overall consumption.  She is taking Creon that she has leftover however she is concerned about the cost of this she is hopeful that her insurance company paperwork will go through before she runs out. Weight is stable  A: Chronic alcoholic pancreatitis with pancreatic insufficiency  P: Continue Creon 3 times daily with meals Contrinue Tramadol  as needed not to exceed 60 pills per month

## 2017-08-31 NOTE — Assessment & Plan Note (Signed)
She reports she is taking her sodium bicarb 3 times a day discussed with her repeating labs however she deferred for financial reasons.

## 2017-08-31 NOTE — Assessment & Plan Note (Signed)
BP at goal 

## 2017-08-31 NOTE — Progress Notes (Signed)
Subjective:  HPI: Sharon Kent is a 57 y.o. female who presents for f/u of cirrhosis/ pancreaitits  Please see Assessment and Plan below for the status of her chronic medical problems.  Review of Systems: Review of Systems  Constitutional: Positive for malaise/fatigue. Negative for fever.  Eyes: Negative for blurred vision.  Cardiovascular: Negative for chest pain.  Gastrointestinal: Positive for abdominal pain (very intermittently). Negative for constipation and vomiting.  Genitourinary: Negative for dysuria and hematuria.  Musculoskeletal: Negative for myalgias.  Psychiatric/Behavioral: Negative for substance abuse.    Objective:  Physical Exam: Vitals:   08/30/17 0821  BP: 136/87  Pulse: 79  Temp: 98.2 F (36.8 C)  TempSrc: Oral  SpO2: 99%  Weight: 143 lb (64.9 kg)  Height:  (1.676 m)   Physical Exam  Constitutional: She is well-developed, well-nourished, and in no distress.  Cardiovascular: Normal rate and regular rhythm.  Pulmonary/Chest: Breath sounds normal. No respiratory distress. She has no wheezes.  Abdominal: She exhibits no distension and no mass. There is no tenderness. There is no rebound.  Large reducable left side ventral hernia, midline surgical scar, no fluid wave  Musculoskeletal: She exhibits no edema.  Psychiatric: Affect normal.  Nursing note and vitals reviewed.  Assessment & Plan:  Essential hypertension BP at goal  Chronic alcoholic pancreatitis [facility]) HPI: No admissions to the hospital or ED visits since January overall she feels she is doing well she is only had some mild intermittent abdominal pain tramadol has been helping this.  She has been trying to avoid alcohol she notes she has occasionally used some alcohol, but has significantly reduced overall consumption.  She is taking Creon that she has leftover however she is concerned about the cost of this she is hopeful that her insurance company paperwork will go through before  she runs out. Weight is stable  A: Chronic alcoholic pancreatitis with pancreatic insufficiency  P: Continue Creon 3 times daily with meals Contrinue Tramadol  as needed not to exceed 60 pills per month  Alcoholic cirrhosis of liver without ascites (HCC) HPI: She reports she has been doing very well since her last visit, she has been taking spironolactone 50 mg daily as well as 20 mg of Lasix daily when she notices increased swelling in her legs she will take an extra 20 mg of Lasix that day.  She notes that she has had some increased energy and no increased abdominal swelling.  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  I discussed with her I think it is reasonable for her to resume work I thought that she had resumed partial work in April.  I did write for her to have some restrictions notably not lifting more than 20 pounds intermittently and not carrying more than 10 pounds I think that she may continue to have improvement in her strength we may consider physical therapy if she hits a plateau but overall I am pleased with her progress thus far certainly we have decreased admissions and ED visits from last year.  A: Alcoholic cirrhosis of liver without ascites  P: She appears to be doing much better at this time I have reinforced alcohol cessation -We will continue spironolactone 50 mg daily and Lasix  20 mg daily is okay to take an additional 20 mg of Lasix for fluid swelling.  I discussed with her that I want to repeat standard labs however she requested a delay of about 1 to 2 months for financial reasons.  I think this is probably okay given she does appear to be doing much better. I wrote a note for her outlining restrictions we will  complete paperwork to send to her insurance company.  Type I RTA She reports she is taking her sodium bicarb 3 times a day discussed with her repeating labs however she deferred for financial reasons.   Medications Ordered No orders of the defined types were placed in this encounter.  Other Orders No orders of the defined types were placed in this encounter.  Follow Up: Return 2-3 months with Dr Mikey Bussing.

## 2017-08-31 NOTE — Assessment & Plan Note (Addendum)
HPI: She reports she has been doing very well since her last visit, she has been taking spironolactone 50 mg daily as well as 20 mg of Lasix daily when she notices increased swelling in her legs she will take an extra 20 mg of Lasix that day.  She notes that she has had some increased energy and no increased abdominal swelling.  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  I discussed with her I think it is reasonable for her to resume work I thought that she had resumed partial work in April.  I did write for her to have some restrictions notably not lifting more than 20 pounds intermittently and not carrying more than 10 pounds I think that she may continue to have improvement in her strength we may consider physical therapy if she hits a plateau but overall I am pleased with her progress thus far certainly we have decreased admissions and ED visits from last year.  A: Alcoholic cirrhosis of liver without ascites  P: She appears to be doing much better at this time I have reinforced alcohol cessation -We will continue spironolactone 50 mg daily and Lasix 20 mg daily is okay to take an additional 20 mg of Lasix for fluid swelling.  I discussed with her that I want to repeat standard labs however she requested a delay of about 1 to 2 months for financial reasons.  I think this is probably okay given she does appear to be doing much better. I wrote a note for her outlining restrictions we will complete paperwork to send to her insurance company.

## 2017-09-05 NOTE — Telephone Encounter (Signed)
Received fax from [facility] stating medication has been approved from 08/29/17-08/27/2020. Pharmacy made aware.Kingsley Spittle Cassady5/29/201910:51 AM

## 2017-09-07 DIAGNOSIS — H2513 Age-related nuclear cataract, bilateral: Secondary | ICD-10-CM | POA: Diagnosis not present

## 2017-09-26 ENCOUNTER — Other Ambulatory Visit: Payer: Self-pay | Admitting: *Deleted

## 2017-09-26 MED ORDER — SODIUM BICARBONATE 650 MG PO TABS: 650.0000 mg | ORAL_TABLET | Freq: Three times a day (TID) | ORAL | 1 refills | Status: DC

## 2017-11-15 ENCOUNTER — Encounter: Payer: PRIVATE HEALTH INSURANCE | Admitting: Internal Medicine

## 2017-11-19 ENCOUNTER — Other Ambulatory Visit: Payer: Self-pay | Admitting: Internal Medicine

## 2017-11-19 NOTE — Telephone Encounter (Signed)
Ondansetron is not in the patient's med list, and not mentioned in the last two clinic notes.

## 2017-11-22 ENCOUNTER — Other Ambulatory Visit: Payer: Self-pay

## 2017-11-22 ENCOUNTER — Encounter: Payer: Self-pay | Admitting: Internal Medicine

## 2017-11-22 ENCOUNTER — Ambulatory Visit (INDEPENDENT_AMBULATORY_CARE_PROVIDER_SITE_OTHER): Payer: BLUE CROSS/BLUE SHIELD | Admitting: Internal Medicine

## 2017-11-22 VITALS — BP 162/112 | HR 78 | Temp 98.0°F | Ht 66.0 in | Wt 154.1 lb

## 2017-11-22 DIAGNOSIS — K8689 Other specified diseases of pancreas: Secondary | ICD-10-CM | POA: Diagnosis not present

## 2017-11-22 DIAGNOSIS — K7031 Alcoholic cirrhosis of liver with ascites: Secondary | ICD-10-CM | POA: Diagnosis not present

## 2017-11-22 DIAGNOSIS — K860 Alcohol-induced chronic pancreatitis: Secondary | ICD-10-CM

## 2017-11-22 DIAGNOSIS — Z79899 Other long term (current) drug therapy: Secondary | ICD-10-CM

## 2017-11-22 DIAGNOSIS — N2589 Other disorders resulting from impaired renal tubular function: Secondary | ICD-10-CM

## 2017-11-22 DIAGNOSIS — K7030 Alcoholic cirrhosis of liver without ascites: Secondary | ICD-10-CM

## 2017-11-22 DIAGNOSIS — F1020 Alcohol dependence, uncomplicated: Secondary | ICD-10-CM

## 2017-11-22 MED ORDER — TRAMADOL HCL 50 MG PO TABS: 50.0000 mg | ORAL_TABLET | Freq: Two times a day (BID) | ORAL | 1 refills | Status: DC | PRN

## 2017-11-22 MED ORDER — ONDANSETRON HCL 4 MG PO TABS: 4.0000 mg | ORAL_TABLET | Freq: Every day | ORAL | 1 refills | Status: DC | PRN

## 2017-11-22 NOTE — Patient Instructions (Signed)
I am sorry you are feeling bad.  I hope the Zofran helps.  I will call with the results of the labwork tomorrow.

## 2017-11-23 ENCOUNTER — Other Ambulatory Visit: Payer: Self-pay | Admitting: *Deleted

## 2017-11-23 ENCOUNTER — Telehealth: Payer: Self-pay

## 2017-11-23 DIAGNOSIS — J45909 Unspecified asthma, uncomplicated: Secondary | ICD-10-CM

## 2017-11-23 LAB — CMP14 + ANION GAP
A/G RATIO: 1.4 (ref 1.2–2.2)
ALT: 34 IU/L — ABNORMAL HIGH (ref 0–32)
AST: 49 IU/L — AB (ref 0–40)
Albumin: 4.7 g/dL (ref 3.5–5.5)
Alkaline Phosphatase: 207 IU/L — ABNORMAL HIGH (ref 39–117)
Anion Gap: 19 mmol/L — ABNORMAL HIGH (ref 10.0–18.0)
BILIRUBIN TOTAL: 0.8 mg/dL (ref 0.0–1.2)
BUN/Creatinine Ratio: 17 (ref 9–23)
BUN: 22 mg/dL (ref 6–24)
CHLORIDE: 93 mmol/L — AB (ref 96–106)
CO2: 17 mmol/L — ABNORMAL LOW (ref 20–29)
Calcium: 9.9 mg/dL (ref 8.7–10.2)
Creatinine, Ser: 1.29 mg/dL — ABNORMAL HIGH (ref 0.57–1.00)
GFR calc non Af Amer: 46 mL/min/{1.73_m2} — ABNORMAL LOW (ref >59)
GFR, EST AFRICAN AMERICAN: 53 mL/min/{1.73_m2} — AB (ref >59)
GLOBULIN, TOTAL: 3.3 g/dL (ref 1.5–4.5)
GLUCOSE: 93 mg/dL (ref 65–99)
POTASSIUM: 4.6 mmol/L (ref 3.5–5.2)
SODIUM: 129 mmol/L — AB (ref 134–144)
TOTAL PROTEIN: 8 g/dL (ref 6.0–8.5)

## 2017-11-23 MED ORDER — PANTOPRAZOLE SODIUM 40 MG PO TBEC: 40.0000 mg | DELAYED_RELEASE_TABLET | Freq: Every day | ORAL | 1 refills | Status: DC

## 2017-11-23 MED ORDER — SPIRONOLACTONE 50 MG PO TABS: 50.0000 mg | ORAL_TABLET | Freq: Every day | ORAL | 1 refills | Status: DC

## 2017-11-23 MED ORDER — PANCRELIPASE (LIP-PROT-AMYL) 24000-76000 UNITS PO CPEP: 72000.0000 [IU] | ORAL_CAPSULE | Freq: Three times a day (TID) | ORAL | 1 refills | Status: AC

## 2017-11-23 MED ORDER — POTASSIUM CHLORIDE CRYS ER 20 MEQ PO TBCR
20.0000 meq | EXTENDED_RELEASE_TABLET | Freq: Two times a day (BID) | ORAL | 1 refills | Status: AC
Start: 2017-11-23 — End: ?

## 2017-11-23 MED ORDER — SODIUM BICARBONATE 650 MG PO TABS: 650.0000 mg | ORAL_TABLET | Freq: Three times a day (TID) | ORAL | 3 refills | Status: AC

## 2017-11-23 MED ORDER — ALBUTEROL SULFATE 108 (90 BASE) MCG/ACT IN AEPB: 2.0000 | INHALATION_SPRAY | RESPIRATORY_TRACT | 1 refills | Status: AC | PRN

## 2017-11-23 MED ORDER — FUROSEMIDE 20 MG PO TABS: 20.0000 mg | ORAL_TABLET | Freq: Every day | ORAL | 1 refills | Status: AC

## 2017-11-23 NOTE — Telephone Encounter (Signed)
Requesting all meds to be filled at [facility] on [address].

## 2017-11-23 NOTE — Telephone Encounter (Signed)
Pt is calling back, needs a refill on all medicine. Pls contact# [phone] she is going to run out of all medicine. [facility] [address]

## 2017-11-23 NOTE — Telephone Encounter (Signed)
That is the only pharmacy listed on her profile

## 2017-11-23 NOTE — Telephone Encounter (Signed)
Had refills but requested all meds from dr Cleda Daube hoffman

## 2017-11-25 NOTE — Progress Notes (Signed)
Subjective:  HPI: Sharon Kent is a 57 y.o. female who presents for f/u of cirrhosis/ pancreaitits  Please see Assessment and Plan below for the status of her chronic medical problems.  Review of Systems: Review of Systems  Constitutional: Positive for malaise/fatigue. Negative for fever.  Eyes: Negative for blurred vision.  Cardiovascular: Positive for leg swelling. Negative for chest pain.  Gastrointestinal: Positive for abdominal pain, nausea and vomiting. Negative for constipation.  Genitourinary: Negative for dysuria and hematuria.  Musculoskeletal: Negative for myalgias.  Psychiatric/Behavioral: Negative for substance abuse.    Objective:  Physical Exam: Vitals:   11/22/17 1439 11/22/17 1548  BP: (!) 149/106 (!) 162/112  Pulse: 91 78  Temp: 98 F (36.7 C)   TempSrc: Oral   SpO2: 97%   Weight: 154 lb 1.6 oz (69.9 kg)   Height: '5\' 6"'  (1.676 m)    Physical Exam  Constitutional: She is well-developed, well-nourished, and in no distress.  Cardiovascular: Normal rate and regular rhythm.  Pulmonary/Chest: Breath sounds normal. No respiratory distress. She has no wheezes.  Abdominal: Soft. Bowel sounds are normal. She exhibits distension. She exhibits no mass. There is no tenderness. There is no rebound.   midline surgical scar, positive fluid wave  Musculoskeletal: She exhibits no edema.  Psychiatric: Affect normal.  Nursing note and vitals reviewed.  Assessment & Plan:  Alcoholic cirrhosis of liver with ascites (HCC) HPI: Since our last visit she was doing well for a time, still has had trouble in that she was not able to get back to work.  Increased financial pressure,  This has lead to more feelings of depression and hopelessness.  She has been drinking, last drink was Friday August 9th, has been having about 5 drinks a day.  Stopped 6 days ago because has been feeling more nauseous, vomiting and generally feeling unwell. She report she is taking spironolactone  7m daily, not taking Lasix (only very PRN).  A: Alcholic cirrhosis of liver with ascites  P: Reiterated ETOH abstinence Check CMP>> hyponatremia c/w volume overload,  AST/ALT alk phos elevations c/w continued ETOH consumption, Albumin and T bili however look good. Resume previous diuretic regimen spironolactone 543mdaily and lasix 2028maily. She requeted note for work, it appears she is declining c/w advancing cirrhosis, I will write for her to remain out of work for 1 month, she will need to be reevaluated prior at that time  Pancreatic insufficiency HPI: Getting assistance with Creon, reports taking as directed, no major issues,  Weight increasing ( although also more volume up than last visit)  Wt Readings from Last 5 Encounters:  11/22/17 154 lb 1.6 oz (69.9 kg)  08/30/17 143 lb (64.9 kg)  07/12/17 133 lb 8 oz (60.6 kg)  05/10/17 151 lb 9.6 oz (68.8 kg)  05/01/17 160 lb 12.8 oz (72.9 kg)   A: Pancreatic insufficiency  P: Continue Creon as prescribed Continue tramadol as needed for abdominal pan due to chronic pancreatits   Type I RTA Reports not taking Sodium bicarb  Repeat BMP, remains acidotic, will have her resume sodium bicarb  Al[facility] use disorder, severe, dependence [facility] for intensive outpatient rehab.   Medications Ordered Meds ordered this encounter  Medications  . ondansetron (ZOFRAN) 4 MG tablet    Sig: Take 1 tablet (4 mg total) by mouth daily as needed for nausea or vomiting.    Dispense:  30 tablet    Refill:  1  . traMADol (ULTRAM) 50 MG tablet  Sig: Take 1 tablet (50 mg total) by mouth every 12 (twelve) hours as needed.    Dispense:  30 tablet    Refill:  1    Chronic pain   Other Orders Orders Placed This Encounter  Procedures  . CMP14 + Anion Gap  . Ambulatory referral to Social Work    Referral Priority:   Routine    Referral Type:   Consultation    Referral Reason:   Specialty Services Required    Number of Visits  Requested:   1   Follow Up: Return in about 3 months (around 02/22/2018), or see Dr Heber Frederica 3 months, see [facility] provider in 1 month if need to remain out of work.

## 2017-11-25 NOTE — Assessment & Plan Note (Signed)
Will refer for intensive outpatient rehab.

## 2017-11-25 NOTE — Assessment & Plan Note (Signed)
Reports not taking Sodium bicarb  Repeat BMP, remains acidotic, will have her resume sodium bicarb

## 2017-11-25 NOTE — Assessment & Plan Note (Addendum)
HPI: Since our last visit she was doing well for a time, still has had trouble in that she was not able to get back to work.  Increased financial pressure,  This has lead to more feelings of depression and hopelessness.  She has been drinking, last drink was Friday August 9th, has been having about 5 drinks a day.  Stopped 6 days ago because has been feeling more nauseous, vomiting and generally feeling unwell. She report she is taking spironolactone 35m daily, not taking Lasix (only very PRN).  A: Alcholic cirrhosis of liver with ascites  P: Reiterated ETOH abstinence Check CMP>> hyponatremia c/w volume overload,  AST/ALT alk phos elevations c/w continued ETOH consumption, Albumin and T bili however look good. Resume previous diuretic regimen spironolactone 582mdaily and lasix 2055maily. She requeted note for work, it appears she is declining c/w advancing cirrhosis, I will write for her to remain out of work for 1 month, she will need to be reevaluated prior at that time

## 2017-11-25 NOTE — Assessment & Plan Note (Signed)
HPI: Getting assistance with Creon, reports taking as directed, no major issues,  Weight increasing ( although also more volume up than last visit)  Wt Readings from Last 5 Encounters:  11/22/17 154 lb 1.6 oz (69.9 kg)  08/30/17 143 lb (64.9 kg)  07/12/17 133 lb 8 oz (60.6 kg)  05/10/17 151 lb 9.6 oz (68.8 kg)  05/01/17 160 lb 12.8 oz (72.9 kg)   A: Pancreatic insufficiency  P: Continue Creon as prescribed Continue tramadol as needed for abdominal pan due to chronic pancreatits

## 2017-12-03 ENCOUNTER — Emergency Department (HOSPITAL_COMMUNITY)
Admission: EM | Admit: 2017-12-03 | Discharge: 2017-12-03 | Discharge status: Against Medical Advice | Payer: BLUE CROSS/BLUE SHIELD | Attending: Emergency Medicine | Admitting: Emergency Medicine

## 2017-12-03 DIAGNOSIS — Z79899 Other long term (current) drug therapy: Secondary | ICD-10-CM | POA: Diagnosis not present

## 2017-12-03 DIAGNOSIS — F17210 Nicotine dependence, cigarettes, uncomplicated: Secondary | ICD-10-CM | POA: Diagnosis not present

## 2017-12-03 DIAGNOSIS — I10 Essential (primary) hypertension: Secondary | ICD-10-CM | POA: Insufficient documentation

## 2017-12-03 DIAGNOSIS — R4182 Altered mental status, unspecified: Secondary | ICD-10-CM | POA: Diagnosis not present

## 2017-12-03 DIAGNOSIS — E878 Other disorders of electrolyte and fluid balance, not elsewhere classified: Secondary | ICD-10-CM | POA: Insufficient documentation

## 2017-12-03 DIAGNOSIS — E871 Hypo-osmolality and hyponatremia: Secondary | ICD-10-CM | POA: Diagnosis not present

## 2017-12-03 DIAGNOSIS — J45909 Unspecified asthma, uncomplicated: Secondary | ICD-10-CM | POA: Insufficient documentation

## 2017-12-03 DIAGNOSIS — R410 Disorientation, unspecified: Secondary | ICD-10-CM | POA: Diagnosis not present

## 2017-12-03 DIAGNOSIS — R404 Transient alteration of awareness: Secondary | ICD-10-CM | POA: Diagnosis not present

## 2017-12-03 LAB — COMPREHENSIVE METABOLIC PANEL
ALT: 52 U/L — ABNORMAL HIGH (ref 0–44)
AST: 79 U/L — ABNORMAL HIGH (ref 15–41)
Albumin: 4.3 g/dL (ref 3.5–5.0)
Alkaline Phosphatase: 160 U/L — ABNORMAL HIGH (ref 38–126)
Anion gap: 13 (ref 5–15)
BUN: 15 mg/dL (ref 6–20)
CO2: 22 mmol/L (ref 22–32)
Calcium: 9.7 mg/dL (ref 8.9–10.3)
Chloride: 86 mmol/L — ABNORMAL LOW (ref 98–111)
Creatinine, Ser: 1.82 mg/dL — ABNORMAL HIGH (ref 0.44–1.00)
GFR calc Af Amer: 34 mL/min — ABNORMAL LOW (ref >60)
GFR calc non Af Amer: 30 mL/min — ABNORMAL LOW (ref >60)
Glucose, Bld: 102 mg/dL — ABNORMAL HIGH (ref 70–99)
Potassium: 4 mmol/L (ref 3.5–5.1)
Sodium: 121 mmol/L — ABNORMAL LOW (ref 135–145)
Total Bilirubin: 1.1 mg/dL (ref 0.3–1.2)
Total Protein: 7.5 g/dL (ref 6.5–8.1)

## 2017-12-03 LAB — CBC
HCT: 37.2 % (ref 36.0–46.0)
Hemoglobin: 12.8 g/dL (ref 12.0–15.0)
MCH: 32.9 pg (ref 26.0–34.0)
MCHC: 34.4 g/dL (ref 30.0–36.0)
MCV: 95.6 fL (ref 78.0–100.0)
PLATELETS: 210 10*3/uL (ref 150–400)
RBC: 3.89 MIL/uL (ref 3.87–5.11)
RDW: 13.6 % (ref 11.5–15.5)
WBC: 5.4 10*3/uL (ref 4.0–10.5)

## 2017-12-03 LAB — ETHANOL

## 2017-12-03 MED ORDER — SODIUM CHLORIDE 0.9 % IV BOLUS: 1000.0000 mL | Freq: Once | INTRAVENOUS | Status: DC

## 2017-12-03 NOTE — ED Provider Notes (Signed)
[facility] EMERGENCY DEPARTMENT Provider Note   CSN: [id number] Arrival date & time: 12/03/17  1612     History   Chief Complaint Chief Complaint  Patient presents with  . Altered Mental Status    HPI Sharon Kent is a 57 y.o. female.  Patient patient presents to the emergency department for an episode of confusion.  The patient reports she has recurrent episodes of confusion that lasts briefly and resolved spontaneously.  She went to the parking lot and could not find her car.  She called the police and told them that her car stolen.  The car was found in the parking lot and the patient said that the people who stole her car was to return to.  The patient was reported to be confused and did not know the date.  The patient's confusion has resolved and she is A+O x4.  The history is provided by the patient.  Altered Mental Status   This is a recurrent problem. Episode onset: months ago, most recent episode this afternoon. The problem has been resolved. Associated symptoms include confusion. Pertinent negatives include no seizures. Risk factors include alcohol intake. Her past medical history is significant for liver disease. Her past medical history does not include dementia or head trauma.    Past Medical History:  Diagnosis Date  . Asthma   . GERD (gastroesophageal reflux disease)   . H/O chronic pancreatitis   . H/O ETOH abuse   . Headache    "monthly" (06/15/2016)  . History of blood transfusion 2000s   "when they explored my abdomen"  . Hx of pulmonary embolus   . Necrotizing pancreatitis 2015  . 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 Pancreatic abscess   . Pneumonia    "twice at least" (06/15/2016)    Patient Active Problem List   Diagnosis Date Noted  . Type I RTA 05/18/2017  . Alcohol use disorder, severe, dependence (HCC) 05/10/2017  . Alcohol induced fatty liver 05/09/2017  . Metabolic acidosis, normal anion gap (NAG) 04/13/2017  .  Fall at home, initial encounter 04/12/2017  . Chronic anterior anal fissure   . Pancreatic insufficiency 01/27/2017  . Severe protein-calorie malnutrition (HCC)   . Purpura, nonthrombopenic (HCC) 09/07/2016  . Chronic pain syndrome 09/07/2016  . External hemorrhoids 08/23/2016  . Essential hypertension 07/23/2016  . 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 Thoracic aortic atherosclerosis (HCC) 06/20/2016  . Hepatic lesion 06/05/2016  . History of hypertension 06/05/2016  . Alcoholic cirrhosis of liver with ascites (HCC) 06/05/2016  . Hypokalemia   . Chronic alcoholic pancreatitis (HCC) 04/21/2016  . Asthma without status asthmaticus 04/21/2016  . 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 S/P cholecystectomy 04/21/2016  . Tobacco abuse 04/21/2016  . History of partial pancreatectomy 11/24/2015    Past Surgical History:  Procedure Laterality Date  . COLONOSCOPY    . COLONOSCOPY N/A 02/02/2017   Procedure: COLONOSCOPY;  Surgeon: Iva Boop, MD;  Location: [facility] ENDOSCOPY;  Service: Endoscopy;  Laterality: N/A;  . ERCP  2018   multiple  . EXPLORATORY LAPAROTOMY  2000s   "took out all my organs and washed the dead tissue"  . IVC FILTER PLACEMENT ([facility] HX)  12/2010?  Marland Kitchen LAPAROSCOPIC CHOLECYSTECTOMY  2000s     OB History   None      Home Medications    Prior to Admission medications   Medication Sig Start Date End Date Taking? Authorizing Provider  Albuterol Sulfate 108 (90 Base)  MCG/ACT AEPB Inhale 2 puffs into the lungs every 4 (four) hours as needed (for shortness of breath). 11/23/17   Gust Rung, DO  furosemide (LASIX) 20 MG tablet Take 1 tablet (20 mg total) by mouth daily. 11/23/17   Gust Rung, DO  hydrocortisone (ANUSOL-HC) 2.5 % rectal cream Place rectally 4 (four) times daily. Patient not taking: Reported on 07/12/2017 02/03/17   Geralyn Corwin Ratliff, DO  lidocaine (XYLOCAINE) 5 % ointment Apply topically 2 (two) times daily as needed for mild  pain. Patient not taking: Reported on 08/30/2017 05/10/17   Gust Rung, DO  Multiple Vitamins-Minerals (MULTIVITAMIN WITH MINERALS) tablet Take 1 tablet by mouth daily.    [provider]  ondansetron (ZOFRAN) 4 MG tablet Take 1 tablet (4 mg total) by mouth daily as needed for nausea or vomiting. 11/22/17 11/22/18  Gust Rung, DO  Pancrelipase, Lip-Prot-Amyl, 24000-76000 units CPEP Take 3 capsules (72,000 Units total) by mouth 3 (three) times daily with meals. 11/23/17   Gust Rung, DO  pantoprazole (PROTONIX) 40 MG tablet Take 1 tablet (40 mg total) by mouth daily. 11/23/17   Gust Rung, DO  phenylephrine (,USE FOR PREPARATION-H,) 0.25 % suppository Place 1 suppository rectally 2 (two) times daily. Patient not taking: Reported on 07/12/2017 06/26/17   Gust Rung, DO  potassium chloride SA (K-DUR,KLOR-CON) 20 MEQ tablet Take 1 tablet (20 mEq total) by mouth 2 (two) times daily. 11/23/17   Gust Rung, DO  sodium bicarbonate 650 MG tablet Take 1 tablet (650 mg total) by mouth 3 (three) times daily. 11/23/17   Gust Rung, DO  spironolactone (ALDACTONE) 50 MG tablet Take 1 tablet (50 mg total) by mouth daily. 11/23/17 11/23/18  Gust Rung, DO  traMADol (ULTRAM) 50 MG tablet Take 1 tablet (50 mg total) by mouth every 12 (twelve) hours as needed. 11/22/17   Gust Rung, DO    Family History Family History  Adopted: Yes  Family history unknown: Yes    Social History Social History   Tobacco Use  . Smoking status: Current Some Day Smoker    Packs/day: 0.50    Years: 31.00    Pack years: 15.50    Types: Cigarettes  . Smokeless tobacco: Never Used  Substance Use Topics  . Alcohol use: Yes    Comment: previous heavy every day intake, currently denies 04/12/17  . Drug use: No     Allergies   Morphine and Oxycodone-acetaminophen   Review of Systems Review of Systems  Constitutional: Negative for chills and fever.  HENT: Negative for ear pain and  sore throat.   Eyes: Negative for pain and visual disturbance.  Respiratory: Negative for cough and shortness of breath.   Cardiovascular: Negative for chest pain and palpitations.  Gastrointestinal: Negative for abdominal pain and vomiting.  Genitourinary: Negative for dysuria and hematuria.  Musculoskeletal: Negative for arthralgias and back pain.  Skin: Negative for color change and rash.  Neurological: Negative for seizures and syncope.  Psychiatric/Behavioral: Positive for confusion.  All other systems reviewed and are negative.    Physical Exam Updated Vital Signs BP (!) 134/99   Pulse 69   Resp (!) 21   SpO2 98%   Physical Exam  Constitutional: She appears well-developed and well-nourished. No distress.  HENT:  Head: Normocephalic and atraumatic.  Eyes: Conjunctivae are normal.  Neck: Neck supple.  Cardiovascular: Normal rate and regular rhythm.  No murmur heard. Pulmonary/Chest: Effort normal and breath sounds  normal. No respiratory distress.  Abdominal: Soft. There is no tenderness.  Musculoskeletal: She exhibits no edema.  Neurological: She is alert.  Alert and oriented x4  Skin: Skin is warm and dry.  Psychiatric: She has a normal mood and affect.  Nursing note and vitals reviewed.    ED Treatments / Results  Labs (all labs ordered are listed, but only abnormal results are displayed) Labs Reviewed  COMPREHENSIVE METABOLIC PANEL - Abnormal; Notable for the following components:      Result Value   Sodium 121 (*)    Chloride 86 (*)    Glucose, Bld 102 (*)    Creatinine, Ser 1.82 (*)    AST 79 (*)    ALT 52 (*)    Alkaline Phosphatase 160 (*)    GFR calc non Af Amer 30 (*)    GFR calc Af Amer 34 (*)    All other components within normal limits  ETHANOL  CBC    EKG None  Radiology No results found.  Procedures Procedures (including critical care time)  Medications Ordered in ED Medications - No data to display   Initial Impression /  Assessment and Plan / ED Course  I have reviewed the triage vital signs and the nursing notes.  Pertinent labs & imaging results that were available during my care of the patient were reviewed by me and considered in my medical decision making (see chart for details).      The patient presented to the emergency department after an episode of confusion.  The confusion has resolved and the patient is back to her baseline.  Screening labs were obtained and revealed a sodium of 121.  I recommended IV fluids and admission, however the patient declines treatment.  She reported that she would go home and take an extra dose of her medicine (she was not able to tell me which medicine she would take an extra dose of) that instructed her not to take extra doses of medicines, rather that she should take them as prescribed.  The patient reported understanding of this.  I discussed the risks of her leaving advice and the patient decided to leave despite the risk of seizures, worsening mental status, death.  The patient understands that she is welcome to return to the emergency department at any time.  The patient left AGAINST MEDICAL ADVICE.  Patient care was supervised by Dr. Fredderick PhenixBelfi.  Nash DimmerJulia Caralynn Gelber  Final Clinical Impressions(s) / ED Diagnoses   Final diagnoses:  Transient alteration of awareness  Hyponatremia  Hypochloremia    ED Discharge Orders    None       Nash DimmerProkesova, Margalit Leece, MD 12/04/17 1127    Rolan BuccoBelfi, Melanie, MD 12/11/17 [phone]

## 2017-12-03 NOTE — ED Triage Notes (Signed)
BIB EMS from [employer] - was found in parking lot stating her car had been stolen - car was actually in lot; upon arrival of EMS, pt interm disoriented; known h/o alcoholic cirrhosis

## 2017-12-03 NOTE — Discharge Instructions (Addendum)
Please follow up with your primary care provider as soon as possible Return to the nearest emergency department if symptoms worsen

## 2017-12-03 NOTE — ED Notes (Signed)
Presently awake, alert, oriented x4 ; appropriate in conversation; calm, cooperative

## 2017-12-03 NOTE — ED Notes (Signed)
Pt. walked down B hallway with no assistance. Pt. denied any SOB/dizziness/unsteadiness during ambulation.

## 2017-12-04 ENCOUNTER — Other Ambulatory Visit: Payer: Self-pay

## 2017-12-04 ENCOUNTER — Encounter: Payer: Self-pay | Admitting: Internal Medicine

## 2017-12-04 ENCOUNTER — Ambulatory Visit (INDEPENDENT_AMBULATORY_CARE_PROVIDER_SITE_OTHER): Payer: BLUE CROSS/BLUE SHIELD | Admitting: Internal Medicine

## 2017-12-04 ENCOUNTER — Encounter: Payer: Self-pay | Admitting: *Deleted

## 2017-12-04 VITALS — BP 127/74 | HR 102 | Temp 97.6°F | Ht 65.0 in | Wt 153.5 lb

## 2017-12-04 DIAGNOSIS — K7460 Unspecified cirrhosis of liver: Secondary | ICD-10-CM | POA: Diagnosis not present

## 2017-12-04 DIAGNOSIS — K7031 Alcoholic cirrhosis of liver with ascites: Secondary | ICD-10-CM

## 2017-12-04 DIAGNOSIS — R188 Other ascites: Secondary | ICD-10-CM

## 2017-12-04 DIAGNOSIS — E871 Hypo-osmolality and hyponatremia: Secondary | ICD-10-CM

## 2017-12-04 DIAGNOSIS — R413 Other amnesia: Secondary | ICD-10-CM | POA: Diagnosis not present

## 2017-12-04 DIAGNOSIS — Z7289 Other problems related to lifestyle: Secondary | ICD-10-CM

## 2017-12-04 LAB — BASIC METABOLIC PANEL
Anion gap: 9 (ref 5–15)
BUN: 11 mg/dL (ref 6–20)
CO2: 23 mmol/L (ref 22–32)
Calcium: 9.3 mg/dL (ref 8.9–10.3)
Chloride: 104 mmol/L (ref 98–111)
Creatinine, Ser: 1.22 mg/dL — ABNORMAL HIGH (ref 0.44–1.00)
GFR calc Af Amer: 56 mL/min — ABNORMAL LOW (ref >60)
GFR calc non Af Amer: 48 mL/min — ABNORMAL LOW (ref >60)
Glucose, Bld: 93 mg/dL (ref 70–99)
Potassium: 3.4 mmol/L — ABNORMAL LOW (ref 3.5–5.1)
Sodium: 136 mmol/L (ref 135–145)

## 2017-12-04 LAB — OSMOLALITY: Osmolality: 287 mOsm/kg (ref 275–295)

## 2017-12-04 LAB — SODIUM, URINE, RANDOM: Sodium, Ur: 84 mmol/L

## 2017-12-04 LAB — OSMOLALITY, URINE: Osmolality, Ur: 356 mOsm/kg (ref 300–900)

## 2017-12-04 NOTE — Patient Instructions (Addendum)
Ms. Ninetta LightsHatcher,   It was a pleasure taking care of you here at the clinic today.  I checked your sodium and it came back to normal but it was faster than I expected.  Here are my recommendations after today's visit:  1.  Please stop drinking the salt water 2.  Continue your current sodium bicarb regimen prescribed by Dr. Mikey BussingHoffman 3.  Return to clinic next week for blood work 4.  I am ordering an ultrasound of your liver  5.  If you start noticing headaches, dizziness, confusion please call us at the clinic as soon as possible.  ~Dr. Dortha SchwalbeAgyei

## 2017-12-04 NOTE — Progress Notes (Signed)
CC: Hospital follow up for Hyponatremia   HPI:  Sharon Kent is a 57 y.o. woman with medical history as listed below presenting for hospital follow-up for hyponatremia.  See problem base assessment below for further details.  Hyponatremia: Sharon Kent was admitted to [facility] emergency department following an episode of confusion.  In the ED she was found to have hyponatremia with Na+ of 121.  She subsequently returned to baseline and refused treatment after ED physician recommended IV fluids left AGAINST MEDICAL ADVICE stating she is tired of hospitals.  Since being discharged, she has been drinking a lot of salt water. she reports that her car was stolen and when the police arrived they found that she had altered mental status and was brought to the ED.  For the past 3 weeks she has been having intermittent memory loss which she reports last for 15 minutes and gradually returned to baseline.  She indicates the only time she has most is when she forgets to take her medication.  She continues to drink alcohol excessively about 6 beers a day.  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  She has been prescribed sodium bicarb tablets with questionable compliance.   Currently, she does not complain of headache, vision changes, nausea, vomiting.  BMP at clinic today showed Na+ of 136 (from 121 in the ED yesterday) which seems like excessive overcorrection due to either patient drinking excessive salt water or falsely decreased sodium the ED.There is no record of serum osmole from her visit in the ED. Today, serum osmole and urine osmole were all normal.  On physical exam, she is AAOx3 with normal neurological examination.  Plan: -Advised patient to discontinue drinking salt water -RTC in 1 week for CMP -Order placed for liver ultrasound given history of cirrhosis with ascites -Continue sodium bicarb as prescribed  by PCP Dr. Mikey BussingHoffman.   Past Medical History:  Diagnosis Date  . Asthma   . GERD (gastroesophageal reflux disease)   . H/O chronic pancreatitis   . H/O ETOH abuse   . Headache    "monthly" (06/15/2016)  . History of blood transfusion 2000s   "when they explored my abdomen"  . Hx of pulmonary embolus   . Necrotizing pancreatitis 2015  . 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 Pancreatic abscess   . Pneumonia    "twice at least" (06/15/2016)   Review of Systems: As per HPI  Physical Exam:  Vitals:   12/04/17 1112  BP: 127/74  Pulse: (!) 102  Temp: 97.6 F (36.4 C)  TempSrc: Oral  SpO2: 99%  Weight: 153 lb 8 oz (69.6 kg)  Height: 5\' 5"  (1.651 m)   Physical Exam  Constitutional: She is oriented to person, place, and time and well-developed, well-nourished, and in no distress.  HENT:  Head: Normocephalic and atraumatic.  Cardiovascular: Normal rate, regular rhythm and normal heart sounds. Exam reveals no gallop and no friction rub.  No murmur heard. Pulmonary/Chest: Effort normal and breath sounds normal. She has no wheezes. She has no rales.  Abdominal: Soft. Bowel sounds are normal. She exhibits no distension. There is no tenderness.  Neurological: She is alert and oriented to person, place, and time. No cranial nerve deficit. Coordination normal.  Psychiatric: Mood, memory and affect normal.    Assessment & Plan:   See Encounters Tab for problem based charting.  Patient discussed with Dr. Sandre Kittyaines.

## 2017-12-04 NOTE — Telephone Encounter (Signed)
Tramadol #30 with 1 refill sent 11/22/2017. Confirmed [facility] and will get ready for patient. Kinnie FeilL. Cleon Thoma, RN, BSN

## 2017-12-04 NOTE — Telephone Encounter (Signed)
traMADol (ULTRAM) 50 MG tablet     [facility] request @ [facility] on [address].

## 2017-12-04 NOTE — Assessment & Plan Note (Signed)
Hyponatremia: [name] was admitted to [facility] emergency department following an episode of confusion.  In the ED she was found to have hyponatremia with Na+ of 121.  She subsequently returned to baseline and refused treatment after ED physician recommended IV fluids left AGAINST MEDICAL ADVICE stating she is tired of hospitals.  Since being discharged, she has been drinking a lot of salt water. she reports that her car was stolen and when the police arrived they found that she had altered mental status and was brought to the ED.  For the past 3 weeks she has been having intermittent memory loss which she reports last for 15 minutes and gradually returned to baseline.  She indicates the only time she has most is when she forgets to take her medication.  She continues to drink alcohol excessively about 6 beers a day.  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  She has been prescribed sodium bicarb tablets with questionable compliance.   Currently, she does not complain of headache, vision changes, nausea, vomiting.  BMP at clinic today showed Na+ of 136 (from 121 in the ED yesterday) which seems like excessive overcorrection due to either patient drinking excessive salt water or falsely decreased sodium the ED.There is no record of serum osmole from her visit in the ED. Today, serum osmole and urine osmole were all normal.  On physical exam, she is AAOx3 with normal neurological examination.  Plan: -Advised patient to discontinue drinking salt water -RTC in 1 week for CMP -Order placed for liver ultrasound given history of cirrhosis with ascites -Continue sodium bicarb as prescribed by PCP Dr. Mikey Kent.

## 2017-12-11 ENCOUNTER — Ambulatory Visit: Payer: BLUE CROSS/BLUE SHIELD

## 2017-12-13 ENCOUNTER — Ambulatory Visit (HOSPITAL_COMMUNITY): Payer: BLUE CROSS/BLUE SHIELD

## 2017-12-13 ENCOUNTER — Ambulatory Visit (INDEPENDENT_AMBULATORY_CARE_PROVIDER_SITE_OTHER): Payer: BLUE CROSS/BLUE SHIELD | Admitting: Internal Medicine

## 2017-12-13 ENCOUNTER — Telehealth: Payer: Self-pay | Admitting: Internal Medicine

## 2017-12-13 VITALS — BP 119/59 | HR 116 | Wt 151.9 lb

## 2017-12-13 DIAGNOSIS — R55 Syncope and collapse: Secondary | ICD-10-CM

## 2017-12-13 DIAGNOSIS — K7031 Alcoholic cirrhosis of liver with ascites: Secondary | ICD-10-CM | POA: Diagnosis not present

## 2017-12-13 DIAGNOSIS — I10 Essential (primary) hypertension: Secondary | ICD-10-CM

## 2017-12-13 DIAGNOSIS — R413 Other amnesia: Secondary | ICD-10-CM | POA: Diagnosis not present

## 2017-12-13 DIAGNOSIS — K860 Alcohol-induced chronic pancreatitis: Secondary | ICD-10-CM

## 2017-12-13 DIAGNOSIS — Z79899 Other long term (current) drug therapy: Secondary | ICD-10-CM | POA: Diagnosis not present

## 2017-12-13 DIAGNOSIS — J45909 Unspecified asthma, uncomplicated: Secondary | ICD-10-CM

## 2017-12-13 DIAGNOSIS — E871 Hypo-osmolality and hyponatremia: Secondary | ICD-10-CM | POA: Diagnosis not present

## 2017-12-13 DIAGNOSIS — R12 Heartburn: Secondary | ICD-10-CM | POA: Diagnosis not present

## 2017-12-13 DIAGNOSIS — F17210 Nicotine dependence, cigarettes, uncomplicated: Secondary | ICD-10-CM

## 2017-12-13 MED ORDER — PANTOPRAZOLE SODIUM 40 MG PO TBEC: 40.0000 mg | DELAYED_RELEASE_TABLET | Freq: Every day | ORAL | 1 refills | Status: DC

## 2017-12-13 MED ORDER — ONDANSETRON HCL 4 MG PO TABS: 4.0000 mg | ORAL_TABLET | Freq: Every day | ORAL | 1 refills | Status: DC | PRN

## 2017-12-13 MED ORDER — SPIRONOLACTONE 50 MG PO TABS: 50.0000 mg | ORAL_TABLET | Freq: Every day | ORAL | 1 refills | Status: AC

## 2017-12-13 NOTE — Progress Notes (Signed)
Internal Medicine Clinic Attending  I saw and evaluated the patient.  I personally confirmed the key portions of the history and exam documented by Dr. Masoudi and I reviewed pertinent patient test results.  The assessment, diagnosis, and plan were formulated together and I agree with the documentation in the resident's note. 

## 2017-12-13 NOTE — Progress Notes (Signed)
Internal Medicine Clinic Attending  I saw and evaluated the patient.  I personally confirmed the key portions of the history and exam documented by Dr. Dortha Schwalbe and I reviewed pertinent patient test results.  The assessment, diagnosis, and plan were formulated together and I agree with the documentation in the resident's note.  Here for follow up of hyponatremia to 121 and confusion. Interacting appropriately today but still flat out refusing admission. Na has normalized to 136, which is concerning for overly rapid correction, although unclear the chronicity of her hyponatremia in the ED. At this point, questionable benefit of admitting her to give DDAVP and free water to slow correction, and she refuses in any case. Will need close follow up of labs.   Jessy Oto, M.D., Ph.D.

## 2017-12-13 NOTE — Telephone Encounter (Signed)
This has been addressed by lab staff and referral coordinator. Kinnie Feil, RN, BSN

## 2017-12-13 NOTE — Progress Notes (Signed)
   CC: F/U for recent ED visit and medications refill  HPI:  Ms.Raeleigh Mas is a 57 y.o. with PMHx as listed below. Is here for medications refill and f/u of recent ED visit for confusion and hyponatremia. Please see A & P for more details.  Past Medical History:  Diagnosis Date  . Asthma   . GERD (gastroesophageal reflux disease)   . H/O chronic pancreatitis   . H/O ETOH abuse   . Headache    "monthly" (06/15/2016)  . History of blood transfusion 2000s   "when they explored my abdomen"  . Hx of pulmonary embolus   . Necrotizing pancreatitis 2015  . 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 Pancreatic abscess   . Pneumonia    "twice at least" (06/15/2016)   Family History  Adopted: Yes  Family history unknown: Yes   Social Hx: Alcohol Abuse: 4-5 drinks daily Smokes 10 cigarettes daily No drug use  Review of Systems: Review of Systems  Constitutional: Negative for chills and fever.  Cardiovascular: Negative for chest pain and palpitations.  Gastrointestinal: Positive for heartburn.  Musculoskeletal: Positive for joint pain.  Neurological: Positive for loss of consciousness.  Endo/Heme/Allergies: Bruises/bleeds easily.  Psychiatric/Behavioral: Positive for memory loss.     Physical Exam: Vitals:   12/13/17 1123  BP: (!) 119/59  Pulse: (!) 116  SpO2: 96%  Physical Exam  Constitutional: She is oriented to person, place, and time. She appears well-developed and well-nourished. No distress.  HENT:  Head: Normocephalic and atraumatic.  Eyes: Conjunctivae are normal.  Cardiovascular: Normal rate, regular rhythm, normal heart sounds and intact distal pulses.  No murmur heard. Pulmonary/Chest: Effort normal. She has decreased breath sounds. She has no wheezes. She has no rales.  Abdominal: Soft. Bowel sounds are normal. She exhibits no distension. There is no tenderness.  Musculoskeletal: Normal range of motion. She exhibits no edema, tenderness or deformity.    Neurological: She is alert and oriented to person, place, and time. She exhibits normal muscle tone.  Skin: Skin is warm and dry.    Assessment & Plan:   Ms. Sluyter is 57 y/o F with Hx of alcoholic pancreatitis, hypertension, asthma is here today for follow-up of recent emergency room visit due to confusion and hyponatremia.  Next day after ED admission, her lab showed normal sodium.  She has not had any more episode of confusion.  Does not have any complaint.  She is hemodynamically stable, is alert and oriented and no new significant finding on exam. We will refill her medications and do CMP today.   -To lab for CMP today -Continue current medications -Follow-up with PCP f/u CMP results -F/u for liver U/S that order on last visit  See Encounters Tab for problem based charting.  Patient  seen with Dr. Oswaldo Done

## 2017-12-13 NOTE — Progress Notes (Signed)
Patient had concerns about bills from [facility] for blood work.  She left before we could give her information.  I called the patient and gave her the Patient Billing  Phone number for [facility] to call and get help with questions about her bills.  Number given, [phone].  Satoshi Kalas,PBT 12/13/17  3:00pm

## 2017-12-13 NOTE — Patient Instructions (Addendum)
It is our pleasure taking care of you today. I am glad that you did not have any more episode of confusion. We refill your medications, will do blood work today and will contact you with result. Please continue your medications, follow up for the ultra sound as will schedule for you. Let us know if you have a concern or question and follow up with Dr. Mikey Bussing.  Thanks, Dr. Teola Bradley

## 2017-12-13 NOTE — Telephone Encounter (Signed)
Pt missed call, unsure who call; pt contact [phone]

## 2017-12-14 LAB — CMP14 + ANION GAP
A/G RATIO: 1.4 (ref 1.2–2.2)
ALK PHOS: 211 IU/L — AB (ref 39–117)
ALT: 28 IU/L (ref 0–32)
AST: 49 IU/L — ABNORMAL HIGH (ref 0–40)
Albumin: 4.5 g/dL (ref 3.5–5.5)
Anion Gap: 19 mmol/L — ABNORMAL HIGH (ref 10.0–18.0)
BILIRUBIN TOTAL: 0.4 mg/dL (ref 0.0–1.2)
BUN/Creatinine Ratio: 8 — ABNORMAL LOW (ref 9–23)
BUN: 8 mg/dL (ref 6–24)
CALCIUM: 9.5 mg/dL (ref 8.7–10.2)
CHLORIDE: 97 mmol/L (ref 96–106)
CO2: 21 mmol/L (ref 20–29)
Creatinine, Ser: 1.03 mg/dL — ABNORMAL HIGH (ref 0.57–1.00)
GFR calc Af Amer: 70 mL/min/{1.73_m2} (ref >59)
GFR calc non Af Amer: 60 mL/min/{1.73_m2} (ref >59)
Globulin, Total: 3.3 g/dL (ref 1.5–4.5)
Glucose: 95 mg/dL (ref 65–99)
POTASSIUM: 4.2 mmol/L (ref 3.5–5.2)
SODIUM: 137 mmol/L (ref 134–144)
Total Protein: 7.8 g/dL (ref 6.0–8.5)

## 2017-12-26 ENCOUNTER — Ambulatory Visit (INDEPENDENT_AMBULATORY_CARE_PROVIDER_SITE_OTHER): Payer: BLUE CROSS/BLUE SHIELD | Admitting: Internal Medicine

## 2017-12-26 ENCOUNTER — Encounter: Payer: Self-pay | Admitting: Internal Medicine

## 2017-12-26 ENCOUNTER — Other Ambulatory Visit: Payer: Self-pay

## 2017-12-26 ENCOUNTER — Other Ambulatory Visit: Payer: Self-pay | Admitting: Internal Medicine

## 2017-12-26 VITALS — BP 126/85 | HR 91 | Temp 97.9°F | Ht 65.0 in | Wt 152.0 lb

## 2017-12-26 DIAGNOSIS — F1020 Alcohol dependence, uncomplicated: Secondary | ICD-10-CM

## 2017-12-26 DIAGNOSIS — Z23 Encounter for immunization: Secondary | ICD-10-CM

## 2017-12-26 DIAGNOSIS — R410 Disorientation, unspecified: Secondary | ICD-10-CM

## 2017-12-26 DIAGNOSIS — R58 Hemorrhage, not elsewhere classified: Secondary | ICD-10-CM | POA: Diagnosis not present

## 2017-12-26 DIAGNOSIS — K7460 Unspecified cirrhosis of liver: Secondary | ICD-10-CM

## 2017-12-26 DIAGNOSIS — E871 Hypo-osmolality and hyponatremia: Secondary | ICD-10-CM | POA: Diagnosis not present

## 2017-12-26 MED ORDER — VITAMIN B-1 250 MG PO TABS: 250.0000 mg | ORAL_TABLET | Freq: Every day | ORAL | 2 refills | Status: DC

## 2017-12-26 NOTE — Assessment & Plan Note (Addendum)
She is having very nonspecific symptoms. Most likely related to her alcohol use. She was having a transient horizontal nystagmus lasting for few beats only.  We will try thiamine 250 mg daily. We will check her vitamin B12 and TSH level. If she continued to have these episodes or if she experience worsening of her symptoms, might need an MRI to rule out any pathologic lesion.  Addendum.  She is having mild hyponatremia with normal vitamin B12 and TSH levels.  She had a chronic mild hyponatremia, most likely not responsible for her current symptoms.  There is no need to make any changes to her current regimen. She should follow-up in 1 month with PCP.

## 2017-12-26 NOTE — Assessment & Plan Note (Signed)
She was concerned about her hyponatremia. Last 2 labs were within normal limit. She is high risk for hyponatremia because of her persistent alcohol abuse and liver cirrhosis. She denies any muscle weakness, generalized malaise or altered mentation.  -We will repeat CMP today.

## 2017-12-26 NOTE — Assessment & Plan Note (Signed)
Continue to drink 2-3 vodka per day.  Councill  again to slowly decreased her alcohol intake and try to quit to prevent further liver injury.

## 2017-12-26 NOTE — Patient Instructions (Signed)
Thank you for visiting clinic today. We will do some lab work, will call you with any abnormal results. You continue to experience these spells, we might have to do a brain MRI. I am also starting you on thiamine to 250 mg daily and see if that will help with your symptoms. Please follow-up with your PCP according to your scheduled appointment.

## 2017-12-26 NOTE — Progress Notes (Signed)
   CC: Episodic confusion.  HPI:  [name] is a 57 y.o. past medical history as listed below came to the clinic with complaints of multiple episodes of transient confusion.  Patient was seen in ED on August 26 with similar complaints, when she forgets where she parked her car in the parking lot and called the police that caught has been stolen, car was found in the same parking lot.  During that ED visit she was found to have a sodium of 121, she was offered admission which she declined, her repeat BMP next day shows sodium of 136, treated 1 week after was 137.   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  She denies any problem with her balance or recent falls. Is having a lot of bruising on her upper extremities and on asking told me that she was doing some repair stuff and hanging curtain rods around the house and hitting her arms.  Patient is not on any blood thinner. She denies any headaches, change in her vision, nausea or vomiting.  She occasionally becomes constipated.  No change in her appetite or weight.  No urinary symptoms.  She wants to have her sodium checked as it was low when she came to ED for similar symptoms, last 2 checks were within normal limit.  She did continue to drink 2-3 vodka daily.  Past Medical History:  Diagnosis Date  . Asthma   . GERD (gastroesophageal reflux disease)   . H/O chronic pancreatitis   . H/O ETOH abuse   . Headache    "monthly" (06/15/2016)  . History of blood transfusion 2000s   "when they explored my abdomen"  . Hx of pulmonary embolus   . Necrotizing pancreatitis 2015  . 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 Pancreatic abscess   . Pneumonia    "twice at least" (06/15/2016)   Review of Systems: Negative except mentioned in HPI.  Physical Exam:  Vitals:   12/26/17 0908 12/26/17 0933  BP: 132/79 126/85    Pulse: 90 91  Temp: 97.9 F (36.6 C)   TempSrc: Oral   SpO2: 99%   Weight: 152 lb (68.9 kg)   Height: 5\' 5"  (1.651 m)    General: Vital signs reviewed.  Patient is well-developed and well-nourished, in no acute distress and cooperative with exam.  Head: Normocephalic and atraumatic. Eyes: EOMI, conjunctivae normal, no scleral icterus, transient horizontal nystagmus bilaterally lasting for 2-3 beats. Cardiovascular: RRR, S1 normal, S2 normal, no murmurs, gallops, or rubs. Pulmonary/Chest: Clear to auscultation bilaterally, no wheezes, rales, or rhonchi. Abdominal: Soft, non-tender, non-distended, BS +,  Extremities: No lower extremity edema bilaterally,  pulses symmetric and intact bilaterally. No cyanosis or clubbing. Neurological: A&O x3, Strength is normal and symmetric bilaterally, cranial nerve II-XII are grossly intact, no focal motor deficit, sensory intact to light touch bilaterally.  Skin: Warm, dry and intact.  Multiple bruising at different stages on upper extremities bilaterally. Psychiatric: Normal mood and affect. speech and behavior is normal. Cognition and memory are normal.  Assessment & Plan:   See Encounters Tab for problem based charting.  Patient discussed with Dr. Cleda DaubE. Hoffman.

## 2017-12-27 ENCOUNTER — Telehealth: Payer: Self-pay | Admitting: *Deleted

## 2017-12-27 LAB — CMP14 + ANION GAP
ALBUMIN: 4.4 g/dL (ref 3.5–5.5)
ALT: 20 IU/L (ref 0–32)
ANION GAP: 17 mmol/L (ref 10.0–18.0)
AST: 33 IU/L (ref 0–40)
Albumin/Globulin Ratio: 1.5 (ref 1.2–2.2)
Alkaline Phosphatase: 197 IU/L — ABNORMAL HIGH (ref 39–117)
BUN / CREAT RATIO: 6 — AB (ref 9–23)
BUN: 6 mg/dL (ref 6–24)
Bilirubin Total: 1 mg/dL (ref 0.0–1.2)
CO2: 21 mmol/L (ref 20–29)
CREATININE: 1.04 mg/dL — AB (ref 0.57–1.00)
Calcium: 9.8 mg/dL (ref 8.7–10.2)
Chloride: 94 mmol/L — ABNORMAL LOW (ref 96–106)
GFR calc Af Amer: 69 mL/min/{1.73_m2} (ref >59)
GFR calc non Af Amer: 60 mL/min/{1.73_m2} (ref >59)
Globulin, Total: 2.9 g/dL (ref 1.5–4.5)
Glucose: 127 mg/dL — ABNORMAL HIGH (ref 65–99)
Potassium: 4 mmol/L (ref 3.5–5.2)
Sodium: 132 mmol/L — ABNORMAL LOW (ref 134–144)
Total Protein: 7.3 g/dL (ref 6.0–8.5)

## 2017-12-27 LAB — VITAMIN B12: Vitamin B-12: 342 pg/mL (ref 232–1245)

## 2017-12-27 LAB — TSH: TSH: 0.963 u[IU]/mL (ref 0.450–4.500)

## 2017-12-27 NOTE — Telephone Encounter (Signed)
[facility] - Vit B 1 only comes in 100 mg tab; do u want pt to take 2 and 1/2 tabs? (prescribed 250 mg) Talked to Dr Nelson ChimesAmin - stated pt take 2 tabs (total of 200 mg). [facility] Dr Nelson ChimesAmin can u change dosage on pt's medication list? Thanks

## 2017-12-27 NOTE — Progress Notes (Signed)
Internal Medicine Clinic Attending  I saw and evaluated the patient.  I personally confirmed the key portions of the history and exam documented by Dr. Nelson ChimesAmin and I reviewed pertinent patient test results.  The assessment, diagnosis, and plan were formulated together and I agree with the documentation in the resident's note. Sharon Kent has not made a much recovery overall, some of this may be due to some continued consumption of ETOH.  However I am not sure how much recovery she will make given all of her disease burden.  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  I would like to see her back in about 1 month to reassess her especially her memory.

## 2017-12-27 NOTE — Telephone Encounter (Signed)
Sure I will thank you.

## 2018-01-02 ENCOUNTER — Encounter: Payer: Self-pay | Admitting: *Deleted

## 2018-01-10 ENCOUNTER — Telehealth: Payer: Self-pay | Admitting: Internal Medicine

## 2018-01-10 DIAGNOSIS — E871 Hypo-osmolality and hyponatremia: Secondary | ICD-10-CM

## 2018-01-10 NOTE — Telephone Encounter (Signed)
Pt would like physician to call back [phone]

## 2018-01-10 NOTE — Telephone Encounter (Signed)
Attempted to rtc to pt, went straight to vmail, lm for rtc

## 2018-01-11 ENCOUNTER — Telehealth: Payer: Self-pay | Admitting: Internal Medicine

## 2018-01-11 MED ORDER — ONDANSETRON HCL 4 MG PO TABS: 4.0000 mg | ORAL_TABLET | Freq: Every day | ORAL | 3 refills | Status: DC | PRN

## 2018-01-11 NOTE — Telephone Encounter (Signed)
Pt missed call from the nurse, pt was asleep. Pls contact pt [phone]

## 2018-01-11 NOTE — Telephone Encounter (Signed)
A repeat BMP would be a good idea before our next appointment. I have placed the order, she can get it done in the next 2-3 weeks or so. I actually told her she does not have to worry about increasing salt intake.  The mild hyponatremia was not concerning to me and I think it could cause increased swelling.

## 2018-01-11 NOTE — Telephone Encounter (Signed)
Attempted to rtc to pt to relay message from dr Mikey Bussing, no answer, lm for rtc

## 2018-01-11 NOTE — Telephone Encounter (Signed)
This has been addressed in initial phone encounter from 01/10/2018. Kinnie Feil, RN, BSN

## 2018-01-11 NOTE — Telephone Encounter (Signed)
Sorry, after sending you the text she ask for zofran, states she "has that stomach bug again" she states she would like a regular script of zofran and she only uses it when she needs it

## 2018-01-11 NOTE — Telephone Encounter (Signed)
I sent in rx for zofran.  I did tell her she could come back to be seen in [facility] in October but that I will not be avaliable, I would be happy to see her in November if she would prefer that to seeing one of our docs in [facility].  She is may however need to get seen just to continue staying out of work depending on where her disability claim stands.

## 2018-01-11 NOTE — Telephone Encounter (Signed)
Returned call to patient. Relayed info below. Scheduled first available appt with PCP on 02/28/2018. Also reviewed lab notes from 12/28/2017 with patient. She is aware not to increase salt intake. Patient is wondering if she can have a lab only appt to repeat BMP. No future lab orders in Epic. Will route to PCP. Kinnie Feil, RN, BSN

## 2018-01-15 NOTE — Telephone Encounter (Signed)
Returned call to patient to schedule lab appt for 2-3 weeks. No answer and VM box is full and cannot accept messages. Kinnie Feil, RN, BSN

## 2018-01-17 ENCOUNTER — Ambulatory Visit: Payer: BLUE CROSS/BLUE SHIELD

## 2018-01-18 NOTE — Telephone Encounter (Signed)
Patient has [facility] appt on 01/22/2018. Kinnie Feil, RN, BSN

## 2018-01-21 ENCOUNTER — Ambulatory Visit: Payer: BLUE CROSS/BLUE SHIELD

## 2018-01-22 ENCOUNTER — Ambulatory Visit (INDEPENDENT_AMBULATORY_CARE_PROVIDER_SITE_OTHER): Payer: BLUE CROSS/BLUE SHIELD | Admitting: Internal Medicine

## 2018-01-22 ENCOUNTER — Other Ambulatory Visit: Payer: Self-pay

## 2018-01-22 DIAGNOSIS — S5012XA Contusion of left forearm, initial encounter: Secondary | ICD-10-CM | POA: Diagnosis not present

## 2018-01-22 DIAGNOSIS — W109XXA Fall (on) (from) unspecified stairs and steps, initial encounter: Secondary | ICD-10-CM | POA: Diagnosis not present

## 2018-01-22 DIAGNOSIS — E871 Hypo-osmolality and hyponatremia: Secondary | ICD-10-CM | POA: Diagnosis not present

## 2018-01-22 DIAGNOSIS — M79602 Pain in left arm: Secondary | ICD-10-CM

## 2018-01-22 DIAGNOSIS — S5011XA Contusion of right forearm, initial encounter: Secondary | ICD-10-CM | POA: Diagnosis not present

## 2018-01-22 MED ORDER — TRAMADOL HCL 50 MG PO TABS: 50.0000 mg | ORAL_TABLET | Freq: Two times a day (BID) | ORAL | 0 refills | Status: DC | PRN

## 2018-01-22 NOTE — Patient Instructions (Signed)
FOLLOW-UP INSTRUCTIONS When: To see your PCP Dr. Mikey Bussing For: Routine What to bring: All of your medications  I have refilled your tramadol today for your chronic pain.   Today we discussed your left arm pain. I agree that this should heal in the next few weeks. I will also write a work excuse for the next two weeks due to the limitations this pain has placed on you.  Thank you for your visit to the [facility] today. If you have any questions or concerns please call us at [phone].

## 2018-01-22 NOTE — Progress Notes (Signed)
   CC: Fall  HPI:[name] is a 57 y.o. female who presents for evaluation of fall with left shoulder pain. Please see individual problem based A/P for details.  Left arm pain: Patient stated that she was walking down the stairs and just recalls slipping and then being on her butt at the bottom of the stairs.  She said that she does not recall how many steps she descended nor if she definitively struck her arm.  However, she stated that her left arm then hurt, although she did not land on the left arm to her knowledge but she does recall striking the right arm.  Patient denies knowingly striking her head, loss of consciousness, altered mental status, confusion post fall, significant abrasion or ecchymosis of her back, hips or head. Physical exam reveals bruising of the left proximal arm overlying the lateral aspect of the humerus, and bruising of the right forearm.  There are no focal tender points to palpation of the inner aspect of the proximal left arm.  There is no evidence of severe weakness or nerve damage to the left forearm as grip strength is intact although slightly limited by pain and sensation is also intact.   Passive extension as well as active extension of the arm are limited by severe pain.  I feel that this is most likely muscular injury.  Currently I find no evidence to indicate bony or ligamentous abnormality.  Plan: Given the restriction of range of motion and minimal weakness I feel that she should recover from this injury but will require additional time for healing.  I provided a work restriction release letter stating to the effect of a 2-week refrain from overuse of the affected site.  Hyponatremia: History of. Patient will obtain Dr. Luz Brazen standing order for her BMP today at her insistence. She will follow-up with Dr. Mikey Bussing in November for the results of this lab.  PHQ-9: Based on the patients    Office Visit from 01/22/2018 in [facility]  PHQ-9 Total Score  2     score we have decided to monitor.  Past Medical History:  Diagnosis Date  . Asthma   . GERD (gastroesophageal reflux disease)   . H/O chronic pancreatitis   . H/O ETOH abuse   . Headache    "monthly" (06/15/2016)  . History of blood transfusion 2000s   "when they explored my abdomen"  . Hx of pulmonary embolus   . Necrotizing pancreatitis 2015  . 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 Pancreatic abscess   . Pneumonia    "twice at least" (06/15/2016)   Review of Systems: ROS negative except as per HPI.  Physical Exam: Vitals:   01/22/18 1408  BP: 135/82  Pulse: 91  Temp: 98.2 F (36.8 C)  TempSrc: Oral  SpO2: 98%  Weight: 154 lb 9.6 oz (70.1 kg)   General: A/O x4, no acute distress, afebrile, nondiaphoretic Cardio: RRR, no murmurs or gallops Pulmonary: CTA bilaterally MSK: See evaluation above  Assessment & Plan:   See Encounters Tab for problem based charting.  Patient discussed with Dr. Sandre Kitty

## 2018-01-23 ENCOUNTER — Encounter: Payer: Self-pay | Admitting: Internal Medicine

## 2018-01-23 DIAGNOSIS — M79602 Pain in left arm: Secondary | ICD-10-CM | POA: Insufficient documentation

## 2018-01-23 LAB — BMP8+ANION GAP
ANION GAP: 17 mmol/L (ref 10.0–18.0)
BUN/Creatinine Ratio: 10 (ref 9–23)
BUN: 8 mg/dL (ref 6–24)
CHLORIDE: 99 mmol/L (ref 96–106)
CO2: 21 mmol/L (ref 20–29)
Calcium: 9.6 mg/dL (ref 8.7–10.2)
Creatinine, Ser: 0.81 mg/dL (ref 0.57–1.00)
GFR calc non Af Amer: 81 mL/min/{1.73_m2} (ref >59)
GFR, EST AFRICAN AMERICAN: 93 mL/min/{1.73_m2} (ref >59)
Glucose: 81 mg/dL (ref 65–99)
POTASSIUM: 5 mmol/L (ref 3.5–5.2)
SODIUM: 137 mmol/L (ref 134–144)

## 2018-01-23 NOTE — Assessment & Plan Note (Signed)
Hyponatremia: History of. Patient will obtain Dr. Luz Brazen standing order for her BMP today at her insistence. She will follow-up with Dr. Mikey Bussing in November for the results of this lab.

## 2018-01-23 NOTE — Assessment & Plan Note (Signed)
Left arm pain: Patient stated that she was walking down the stairs and just recalls slipping and then being on her butt at the bottom of the stairs.  She said that she does not recall how many steps she descended nor if she definitively struck her arm.  However, she stated that her left arm then hurt, although she did not land on the left arm to her knowledge but she does recall striking the right arm.  Patient denies knowingly striking her head, loss of consciousness, altered mental status, confusion post fall, significant abrasion or ecchymosis of her back, hips or head. Physical exam reveals bruising of the left proximal arm overlying the lateral aspect of the humerus, and bruising of the right forearm.  There are no focal tender points to palpation of the inner aspect of the proximal left arm.  There is no evidence of severe weakness or nerve damage to the left forearm as grip strength is intact although slightly limited by pain and sensation is also intact.   Passive extension as well as active extension of the arm are limited by severe pain.  I feel that this is most likely muscular injury.  Currently I find no evidence to indicate bony or ligamentous abnormality.  Plan: Given the restriction of range of motion and minimal weakness I feel that she should recover from this injury but will require additional time for healing.  I provided a work restriction release letter stating to the effect of a 2-week refrain from overuse of the affected site.

## 2018-01-30 NOTE — Progress Notes (Signed)
Internal Medicine Clinic Attending  Case discussed with Dr. Harbrecht at the time of the visit.  We reviewed the resident's history and exam and pertinent patient test results.  I agree with the assessment, diagnosis, and plan of care documented in the resident's note.  Alexander Raines, M.D., Ph.D.  

## 2018-02-06 ENCOUNTER — Other Ambulatory Visit: Payer: Self-pay | Admitting: Internal Medicine

## 2018-02-07 NOTE — Telephone Encounter (Signed)
Next appt scheduled 11/21 with PCP. 

## 2018-02-28 ENCOUNTER — Encounter: Payer: Self-pay | Admitting: Internal Medicine

## 2018-02-28 ENCOUNTER — Other Ambulatory Visit: Payer: Self-pay

## 2018-02-28 ENCOUNTER — Ambulatory Visit (INDEPENDENT_AMBULATORY_CARE_PROVIDER_SITE_OTHER): Payer: BLUE CROSS/BLUE SHIELD | Admitting: Internal Medicine

## 2018-02-28 VITALS — BP 146/97 | HR 75 | Temp 98.3°F | Ht 65.0 in | Wt 145.5 lb

## 2018-02-28 DIAGNOSIS — Z9181 History of falling: Secondary | ICD-10-CM

## 2018-02-28 DIAGNOSIS — Z23 Encounter for immunization: Secondary | ICD-10-CM

## 2018-02-28 DIAGNOSIS — K7030 Alcoholic cirrhosis of liver without ascites: Secondary | ICD-10-CM | POA: Diagnosis not present

## 2018-02-28 DIAGNOSIS — Z79899 Other long term (current) drug therapy: Secondary | ICD-10-CM

## 2018-02-28 DIAGNOSIS — M25512 Pain in left shoulder: Secondary | ICD-10-CM

## 2018-02-28 DIAGNOSIS — M79602 Pain in left arm: Secondary | ICD-10-CM

## 2018-02-28 DIAGNOSIS — K8689 Other specified diseases of pancreas: Secondary | ICD-10-CM | POA: Diagnosis not present

## 2018-02-28 DIAGNOSIS — R410 Disorientation, unspecified: Secondary | ICD-10-CM

## 2018-02-28 DIAGNOSIS — Z1239 Encounter for other screening for malignant neoplasm of breast: Secondary | ICD-10-CM

## 2018-02-28 MED ORDER — PANTOPRAZOLE SODIUM 40 MG PO TBEC: 40.0000 mg | DELAYED_RELEASE_TABLET | Freq: Every day | ORAL | 3 refills | Status: DC

## 2018-02-28 MED ORDER — VITAMIN B-1 250 MG PO TABS: 250.0000 mg | ORAL_TABLET | Freq: Every day | ORAL | 3 refills | Status: DC

## 2018-02-28 MED ORDER — TRAMADOL HCL 50 MG PO TABS: 50.0000 mg | ORAL_TABLET | Freq: Two times a day (BID) | ORAL | 0 refills | Status: AC | PRN

## 2018-02-28 NOTE — Assessment & Plan Note (Signed)
HPI: Left should pain since fall about 6 weeks ago, seen in clinic 4 weeks ago, has not had signifciant improvement.  Notes fall was at home/apartment, fell down about 8 concreate stairs, tried to grasp handle with left hand, decreased ROM sicne that time. Tramadol has helped the pain.  A: Left shoulder pain, concern for partial versus full rotator cuff tear  P: I am somewhat concerned for a supraspinatous tear however exam not clear, may be partial,  Offered referral to orthopaedic surgery versus steroid injection.  Patient opted for steroid injection which was prefromed today, instructed her to call me next week if ROM is not improved and will need referral to ortho.

## 2018-02-28 NOTE — Progress Notes (Signed)
Subjective:  HPI: [name] is a 57 y.o. female who presents for left shoulder pain, f/u chronic pancreatitis, cirrhosis  Please see Assessment and Plan below for the status of her chronic medical problems.  Review of Systems: Review of Systems  Constitutional: Negative for fever and weight loss.  Eyes: Negative for blurred vision.  Cardiovascular: Negative for chest pain, orthopnea and leg swelling.  Gastrointestinal: Negative for abdominal pain, diarrhea and heartburn.  Musculoskeletal: Positive for falls and joint pain.  Psychiatric/Behavioral: Negative for depression, hallucinations and substance abuse.    Objective:  Physical Exam: Vitals:   02/28/18 0825  BP: (!) 146/97  Pulse: 75  Temp: 98.3 F (36.8 C)  TempSrc: Oral  SpO2: 97%  Weight: 145 lb 8 oz (66 kg)  Height: 5\' 5"  (1.651 m)   Body mass index is 24.21 kg/m. Physical Exam  Cardiovascular: Normal rate and regular rhythm.  Pulmonary/Chest: Effort normal and breath sounds normal.  Abdominal: Soft. Bowel sounds are normal. She exhibits no distension. There is no tenderness.  Musculoskeletal:       Right shoulder: She exhibits normal range of motion, no tenderness, no swelling and no deformity.       Left shoulder: She exhibits decreased range of motion and tenderness. She exhibits no bony tenderness, no swelling, no effusion, no crepitus and no deformity.  Left shoulder unable to abduct past about 95 degrees, good internal and external rotation, empty can test slightly weak on left, positive for pain with neers impingment testing  Nursing note and vitals reviewed.  Assessment & Plan:  Alcoholic cirrhosis of liver (HCC) HPI: currently doing well reports last drink was about 1 week ago, taking medications as directed. Feels B1 supplement has helped with memory issues.  No abdominal or leg swelling.  A: Compensated Alcohol cirrhosis  P: Continue Lasix and spironolactone Abstinence from etoh Continue  thiamine supplement Labs from last visit best she has had in long time- essentially normalized.  Overall I think she is likely at maximal expected improvement from cirrhosis/pancreatitis.  Has not yet returned to work reports [employer] has not found a full time position to accommodate restrictions, notes she has started application for SS disability.  Pancreatic insufficiency HPI: Reports adhernece to creon, no diarrhea. No abd pain  A: Pancreatic insufficiency  P: Wt down 9lbs but I think that was mostly water weight, overall appears stable, continue creon supplementation  Episodic confusion Improved with thiamine.  Arm pain, musculoskeletal, left HPI: Left should pain since fall about 6 weeks ago, seen in clinic 4 weeks ago, has not had signifciant improvement.  Notes fall was at home/apartment, fell down about 8 concreate stairs, tried to grasp handle with left hand, decreased ROM sicne that time. Tramadol has helped the pain.  A: Left shoulder pain, concern for partial versus full rotator cuff tear  P: I am somewhat concerned for a supraspinatous tear however exam not clear, may be partial,  Offered referral to orthopaedic surgery versus steroid injection.  Patient opted for steroid injection which was prefromed today, instructed her to call me next week if ROM is not improved and will need referral to ortho.   Medications Ordered Meds ordered this encounter  Medications  . traMADol (ULTRAM) 50 MG tablet    Sig: Take 1 tablet (50 mg total) by mouth every 12 (twelve) hours as needed.    Dispense:  30 tablet    Refill:  0    Not to exceed 5 additional fills before 07/21/2018  .  pantoprazole (PROTONIX) 40 MG tablet    Sig: Take 1 tablet (40 mg total) by mouth daily.    Dispense:  90 tablet    Refill:  3  . Thiamine HCl (VITAMIN B-1) 250 MG tablet    Sig: Take 1 tablet (250 mg total) by mouth daily.    Dispense:  90 tablet    Refill:  3   Other Orders Orders Placed This  Encounter  Procedures  . MM Digital Screening    BCBS PF:    Standing Status:   Future    Standing Expiration Date:   05/01/2019    Order Specific Question:   Reason for Exam (SYMPTOM  OR DIAGNOSIS REQUIRED)    Answer:   ANNUAL    Order Specific Question:   Is the patient pregnant?    Answer:   Yes    Order Specific Question:   Preferred imaging [facility]?    [facility]  . Tdap vaccine greater than or equal to 7yo IM   Follow Up: Return 2-3 months.

## 2018-02-28 NOTE — Patient Instructions (Signed)
Call me if the shoulder is not improving so I may refer you to orthopaedic surgery

## 2018-02-28 NOTE — Progress Notes (Signed)
PROCEDURE NOTE  PROCEDURE: left shoulder joint steroid injection.  PREOPERATIVE DIAGNOSIS: Pain of the left shoulder.  POSTOPERATIVE DIAGNOSIS: Pain of the left shoulder.  PROCEDURE: The patient was apprised of the risks and the benefits of the procedure and informed consent was obtained, as witnessed by Gus. Time-out procedure was performed, with confirmation of the patient's name, date of birth, and correct identification of the left shoulder to be injected. The patient's shoulder was then marked at the appropriate site for injection placement. The shoulder was sterilely prepped with Betadine. A 40 mg (1 milliliter) solution of Kenalog was drawn up into a 3 mL syringe with a 2 mL of 1% lidocaine. The patient was injected with a 25 gauge needle at the lateral  aspect of her  left shoulder. There were no complications. The patient tolerated the procedure well. There was minimal bleeding. The patient was instructed to ice her shoulder upon leaving clinic and refrain from overuse over the next 3 days. The patient was instructed to go to the emergency room with any usual pain, swelling, or redness occurred in the injected area. The patient was given a followup appointment to evaluate response to the injection to his increased range of motion and reduction of pain.

## 2018-02-28 NOTE — Assessment & Plan Note (Signed)
HPI: currently doing well reports last drink was about 1 week ago, taking medications as directed. Feels B1 supplement has helped with memory issues.  No abdominal or leg swelling.  A: Compensated Alcohol cirrhosis  P: Continue Lasix and spironolactone Abstinence from etoh Continue thiamine supplement Labs from last visit best she has had in long time- essentially normalized.  Overall I think she is likely at maximal expected improvement from cirrhosis/pancreatitis.  Has not yet returned to work reports [employer] has not found a full time position to accommodate restrictions, notes she has started application for SS disability.

## 2018-02-28 NOTE — Assessment & Plan Note (Signed)
Improved with thiamine.

## 2018-02-28 NOTE — Assessment & Plan Note (Addendum)
HPI: Reports adhernece to creon, no diarrhea. No abd pain  A: Pancreatic insufficiency  P: Wt down 9lbs but I think that was mostly water weight, overall appears stable, continue creon supplementation

## 2018-03-05 ENCOUNTER — Telehealth: Payer: Self-pay | Admitting: *Deleted

## 2018-03-05 MED ORDER — VITAMIN B-1 100 MG PO TABS: 100.0000 mg | ORAL_TABLET | Freq: Every day | ORAL | 3 refills | Status: DC

## 2018-03-05 NOTE — Telephone Encounter (Signed)
Thiamine 250 mg tabs #90 with 3 refills sent 02/28/2018. Received fax from [facility] with following:  "Alternative requested. 250 mg not available, please send Rx for 100 mg tablets." L. Leward Quanucatte, RN, BSN

## 2018-03-13 ENCOUNTER — Telehealth: Payer: Self-pay | Admitting: *Deleted

## 2018-03-13 NOTE — Telephone Encounter (Addendum)
Information was sent to Cover[employer]Meds and [employer] [place] for [place] for Lidocaine 5% Gel.  Awaiting decision within 3-5 business days.  Angelina OkGladys Helaina Stefano, RN 03/13/2018 11:07 AM. Denial through CoverMyMeds 03/14/2018  Angelina OkGladys Yandell Mcjunkins, RN 03/14/2018 12:08 PM.

## 2018-05-16 ENCOUNTER — Ambulatory Visit: Payer: BLUE CROSS/BLUE SHIELD | Admitting: Internal Medicine

## 2018-08-05 ENCOUNTER — Emergency Department: Payer: Medicaid Other

## 2018-08-05 ENCOUNTER — Observation Stay
Admission: EM | Admit: 2018-08-05 | Discharge: 2018-08-07 | Disposition: A | Discharge status: Home / Self Care | Payer: Medicaid Other | Attending: Internal Medicine | Admitting: Internal Medicine

## 2018-08-05 DIAGNOSIS — K7030 Alcoholic cirrhosis of liver without ascites: Secondary | ICD-10-CM | POA: Insufficient documentation

## 2018-08-05 DIAGNOSIS — S51852A Open bite of left forearm, initial encounter: Secondary | ICD-10-CM

## 2018-08-05 DIAGNOSIS — D649 Anemia, unspecified: Secondary | ICD-10-CM | POA: Insufficient documentation

## 2018-08-05 DIAGNOSIS — N179 Acute kidney failure, unspecified: Secondary | ICD-10-CM | POA: Insufficient documentation

## 2018-08-05 DIAGNOSIS — Z885 Allergy status to narcotic agent status: Secondary | ICD-10-CM | POA: Insufficient documentation

## 2018-08-05 DIAGNOSIS — Z9049 Acquired absence of other specified parts of digestive tract: Secondary | ICD-10-CM | POA: Insufficient documentation

## 2018-08-05 DIAGNOSIS — F1010 Alcohol abuse, uncomplicated: Secondary | ICD-10-CM

## 2018-08-05 DIAGNOSIS — I10 Essential (primary) hypertension: Secondary | ICD-10-CM | POA: Insufficient documentation

## 2018-08-05 DIAGNOSIS — Z79899 Other long term (current) drug therapy: Secondary | ICD-10-CM | POA: Insufficient documentation

## 2018-08-05 DIAGNOSIS — K861 Other chronic pancreatitis: Secondary | ICD-10-CM | POA: Insufficient documentation

## 2018-08-05 DIAGNOSIS — E8729 Other acidosis: Secondary | ICD-10-CM | POA: Diagnosis present

## 2018-08-05 DIAGNOSIS — E872 Acidosis: Principal | ICD-10-CM | POA: Insufficient documentation

## 2018-08-05 DIAGNOSIS — K219 Gastro-esophageal reflux disease without esophagitis: Secondary | ICD-10-CM | POA: Insufficient documentation

## 2018-08-05 DIAGNOSIS — Z86010 Personal history of colonic polyps: Secondary | ICD-10-CM | POA: Insufficient documentation

## 2018-08-05 DIAGNOSIS — W540XXA Bitten by dog, initial encounter: Secondary | ICD-10-CM | POA: Insufficient documentation

## 2018-08-05 DIAGNOSIS — S51851A Open bite of right forearm, initial encounter: Secondary | ICD-10-CM | POA: Insufficient documentation

## 2018-08-05 DIAGNOSIS — F17210 Nicotine dependence, cigarettes, uncomplicated: Secondary | ICD-10-CM | POA: Insufficient documentation

## 2018-08-05 DIAGNOSIS — Z791 Long term (current) use of non-steroidal anti-inflammatories (NSAID): Secondary | ICD-10-CM | POA: Insufficient documentation

## 2018-08-05 DIAGNOSIS — Z86718 Personal history of other venous thrombosis and embolism: Secondary | ICD-10-CM | POA: Insufficient documentation

## 2018-08-05 LAB — I-STAT CHEM 8 CARTRIDGE
Anion Gap I-Stat: 21 — ABNORMAL HIGH (ref 7.0–16.0)
BUN I-Stat: 34 mg/dL — ABNORMAL HIGH (ref 7–22)
Calcium Ionized I-Stat: 3.8 mg/dL — ABNORMAL LOW (ref 4.35–5.10)
Chloride I-Stat: 103 mMol/L (ref 98–110)
Creatinine I-Stat: 1.7 mg/dL — ABNORMAL HIGH (ref 0.60–1.20)
EGFR: 33 mL/min/{1.73_m2} — ABNORMAL LOW (ref 60–150)
Glucose I-Stat: 58 mg/dL — ABNORMAL LOW (ref 71–99)
Hematocrit I-Stat: 39 % (ref 36.0–48.0)
Hemoglobin I-Stat: 13.3 gm/dL (ref 12.0–16.0)
Potassium I-Stat: 5 mMol/L (ref 3.5–5.3)
Sodium I-Stat: 133 mMol/L — ABNORMAL LOW (ref 136–147)
TCO2 I-Stat: 14 mMol/L — CL (ref 24–29)

## 2018-08-05 LAB — CBC AND DIFFERENTIAL
Bands: 1 % (ref 0–10)
Basophils %: 0 % (ref 0.0–3.0)
Basophils Absolute: 0 10*3/uL (ref 0.0–0.3)
Eosinophils %: 3 % (ref 0.0–7.0)
Eosinophils Absolute: 0.2 10*3/uL (ref 0.0–0.8)
Hematocrit: 40.2 % (ref 36.0–48.0)
Hemoglobin: 12.9 gm/dL (ref 12.0–16.0)
Lymphocytes Absolute: 2.4 10*3/uL (ref 0.6–5.1)
Lymphocytes: 37 % (ref 15.0–46.0)
MCH: 34 pg (ref 28–35)
MCHC: 32 gm/dL (ref 32–36)
MCV: 107 fL — ABNORMAL HIGH (ref 80–100)
MPV: 8.5 fL (ref 6.0–10.0)
Monocytes Absolute: 0.6 10*3/uL (ref 0.1–1.7)
Monocytes: 9 % (ref 3.0–15.0)
Neutrophils %: 50 % (ref 42.0–78.0)
Neutrophils Absolute: 3.3 10*3/uL (ref 1.7–8.6)
PLT CT: 72 10*3/uL — ABNORMAL LOW (ref 130–440)
RBC: 3.76 10*6/uL — ABNORMAL LOW (ref 3.80–5.00)
RDW: 14.8 % — ABNORMAL HIGH (ref 11.0–14.0)
WBC: 6.4 10*3/uL (ref 4.0–11.0)

## 2018-08-05 LAB — VH URINALYSIS WITH MICROSCOPIC AND CULTURE IF INDICATED
Bilirubin, UA: NEGATIVE
Blood, UA: NEGATIVE
Glucose, UA: NEGATIVE mg/dL
Ketones UA: 20 mg/dL — AB
Leukocyte Esterase, UA: NEGATIVE Leu/uL
Nitrite, UA: NEGATIVE
Protein, UR: 30 mg/dL — AB
RBC, UA: 2 /hpf (ref 0–5)
Squam Epithel, UA: 3 /hpf — ABNORMAL HIGH (ref 0–2)
Urine Specific Gravity: 1.012 (ref 1.001–1.040)
Urobilinogen, UA: NORMAL mg/dL
WBC, UA: 1 /hpf (ref 0–4)
pH, Urine: 6 pH (ref 5.0–8.0)

## 2018-08-05 LAB — I-STAT CG4 ARTERIAL CARTRIDGE
HCO3, ISTAT: <14.1 mMol/L — CL (ref 20.0–29.0)
Lactic Acid I-Stat: 4 mMol/L (ref 0.5–1.9)
PCO2, ISTAT: 24.8 mm Hg — ABNORMAL LOW (ref 35.0–45.0)
PO2, ISTAT: 74 mm Hg — ABNORMAL LOW (ref 75–100)
Room Number I-Stat: 53
i-STAT FIO2: 21 %
pH, ISTAT: 7.28 — ABNORMAL LOW (ref 7.35–7.45)

## 2018-08-05 LAB — ETHANOL: Alcohol: 209 mg/dL — ABNORMAL HIGH (ref 0–9)

## 2018-08-05 LAB — HEPATIC FUNCTION PANEL
ALT: 63 U/L — ABNORMAL HIGH (ref 0–55)
AST (SGOT): 168 U/L — ABNORMAL HIGH (ref 10–42)
Albumin/Globulin Ratio: 1.1 Ratio (ref 0.80–2.00)
Albumin: 4.5 gm/dL (ref 3.5–5.0)
Alkaline Phosphatase: 274 U/L — ABNORMAL HIGH (ref 40–145)
Bilirubin Direct: 0.9 mg/dL — ABNORMAL HIGH (ref 0.0–0.3)
Bilirubin, Total: 1.7 mg/dL — ABNORMAL HIGH (ref 0.1–1.2)
Globulin: 4.1 gm/dL — ABNORMAL HIGH (ref 2.0–4.0)
Protein, Total: 8.6 gm/dL — ABNORMAL HIGH (ref 6.0–8.3)

## 2018-08-05 LAB — VH I-STAT CHEM 8 NOTIFICATION

## 2018-08-05 LAB — BASIC METABOLIC PANEL
Anion Gap: 32.3 mMol/L — ABNORMAL HIGH (ref 7.0–18.0)
BUN / Creatinine Ratio: 17 Ratio (ref 10.0–30.0)
BUN: 23 mg/dL — ABNORMAL HIGH (ref 7–22)
CO2: 10 mMol/L — CL (ref 20–30)
Calcium: 8.5 mg/dL (ref 8.5–10.5)
Chloride: 99 mMol/L (ref 98–110)
Creatinine: 1.35 mg/dL — ABNORMAL HIGH (ref 0.60–1.20)
EGFR: 44 mL/min/{1.73_m2} — ABNORMAL LOW (ref 60–150)
Glucose: 56 mg/dL — ABNORMAL LOW (ref 71–99)
Osmolality Calculated: 273 mOsm/kg — ABNORMAL LOW (ref 275–300)
Potassium: 4.3 mMol/L (ref 3.5–5.3)
Sodium: 136 mMol/L (ref 136–147)

## 2018-08-05 LAB — PT AND APTT
PT INR: 1.2 (ref 0.5–1.3)
PT: 12.4 s — ABNORMAL HIGH (ref 9.5–11.5)
aPTT: 25.2 s (ref 24.0–34.0)

## 2018-08-05 LAB — BETA-HYDROXYBUTYRATE: Betahydroxybutyrate: 5.86 mMol/L (ref 0.02–0.27)

## 2018-08-05 LAB — MAGNESIUM: Magnesium: 1.5 mg/dL — ABNORMAL LOW (ref 1.6–2.6)

## 2018-08-05 LAB — SALICYLATE LEVEL: Salicylate Level: <5 mg/dL (ref 0.0–30.0)

## 2018-08-05 MED ORDER — FOLIC ACID 1 MG PO TABS
1.0000 mg | ORAL_TABLET | Freq: Every day | ORAL | Status: DC
Start: 2018-08-06 — End: 2018-08-07
  Administered 2018-08-06 – 2018-08-07 (×2): 1 mg via ORAL
  Filled 2018-08-05 (×2): qty 1

## 2018-08-05 MED ORDER — VH VITAMIN B-1 100 MG PO TABS (WRAP)
100.0000 mg | ORAL_TABLET | Freq: Every day | ORAL | Status: DC
Start: 2018-08-06 — End: 2018-08-07
  Administered 2018-08-06 – 2018-08-07 (×2): 100 mg via ORAL
  Filled 2018-08-05 (×2): qty 1

## 2018-08-05 MED ORDER — SODIUM CHLORIDE 0.9 % IV BOLUS
1000.0000 mL | Freq: Once | INTRAVENOUS | Status: AC
Start: 2018-08-05 — End: 2018-08-05
  Administered 2018-08-05: 1000 mL via INTRAVENOUS

## 2018-08-05 MED ORDER — CEROVITE ADVANCED FORMULA PO TABS
1.0000 | ORAL_TABLET | Freq: Every day | ORAL | Status: DC
Start: 2018-08-06 — End: 2018-08-07
  Administered 2018-08-06 – 2018-08-07 (×2): 1 via ORAL
  Filled 2018-08-05 (×2): qty 1

## 2018-08-05 MED ORDER — VH VITAMIN B-1 100 MG PO TABS (WRAP)
100.0000 mg | ORAL_TABLET | Freq: Every day | ORAL | Status: DC
Start: 2018-08-06 — End: 2018-08-05

## 2018-08-05 MED ORDER — AMOXICILLIN-POT CLAVULANATE 875-125 MG PO TABS
ORAL_TABLET | ORAL | Status: AC
Start: 2018-08-05 — End: ?
  Filled 2018-08-05: qty 1

## 2018-08-05 MED ORDER — ONDANSETRON 4 MG PO TBDP
4.00 mg | ORAL_TABLET | Freq: Three times a day (TID) | ORAL | Status: DC | PRN
Start: 2018-08-05 — End: 2018-08-07
  Administered 2018-08-07: 09:00:00 4 mg via ORAL
  Filled 2018-08-05: qty 1

## 2018-08-05 MED ORDER — PHENOBARBITAL 32.4 MG PO TABS
ORAL_TABLET | ORAL | Status: AC
Start: 2018-08-05 — End: ?
  Filled 2018-08-05: qty 2

## 2018-08-05 MED ORDER — ONDANSETRON HCL 4 MG/2ML IJ SOLN
4.00 mg | Freq: Three times a day (TID) | INTRAMUSCULAR | Status: DC | PRN
Start: 2018-08-05 — End: 2018-08-07
  Administered 2018-08-06: 4 mg via INTRAVENOUS
  Filled 2018-08-05: qty 2

## 2018-08-05 MED ORDER — SPIRONOLACTONE 50 MG PO TABS
50.0000 mg | ORAL_TABLET | Freq: Every day | ORAL | Status: DC
Start: 2018-08-06 — End: 2018-08-06

## 2018-08-05 MED ORDER — DIAZEPAM 5 MG PO TABS
10.0000 mg | ORAL_TABLET | ORAL | Status: DC | PRN
Start: 2018-08-05 — End: 2018-08-07

## 2018-08-05 MED ORDER — PANCRELIPASE (LIP-PROT-AMYL) 24000-76000 UNITS PO CPEP
72000.0000 [IU] | ORAL_CAPSULE | Freq: Three times a day (TID) | ORAL | Status: DC
Start: 2018-08-06 — End: 2018-08-07
  Administered 2018-08-06 – 2018-08-07 (×5): 72000 [IU] via ORAL
  Filled 2018-08-05 (×6): qty 3

## 2018-08-05 MED ORDER — AMOXICILLIN-POT CLAVULANATE 875-125 MG PO TABS
875.0000 mg | ORAL_TABLET | Freq: Once | ORAL | Status: AC
Start: 2018-08-05 — End: 2018-08-05
  Administered 2018-08-05: 1 via ORAL

## 2018-08-05 MED ORDER — SODIUM BICARBONATE 8.4 % IV SOLN
100.00 mL/h | INTRAVENOUS | Status: DC
Start: 2018-08-05 — End: 2018-08-06
  Administered 2018-08-06: 01:00:00 100 mL/h via INTRAVENOUS
  Filled 2018-08-05 (×2): qty 1000

## 2018-08-05 MED ORDER — DIAZEPAM 5 MG PO TABS
5.0000 mg | ORAL_TABLET | ORAL | Status: DC | PRN
Start: 2018-08-05 — End: 2018-08-07
  Administered 2018-08-06: 04:00:00 5 mg via ORAL
  Filled 2018-08-05: qty 1

## 2018-08-05 MED ORDER — DEXTROSE-SODIUM CHLORIDE 5-0.9 % IV SOLN
INTRAVENOUS | Status: DC
Start: 2018-08-05 — End: 2018-08-07
  Filled 2018-08-05: qty 1000

## 2018-08-05 MED ORDER — LIDOCAINE-EPINEPHRINE 2 %-1:200000 IJ SOLN
INTRAMUSCULAR | Status: AC
Start: 2018-08-05 — End: ?
  Filled 2018-08-05: qty 20

## 2018-08-05 MED ORDER — PHENOBARBITAL 32.4 MG PO TABS
64.8000 mg | ORAL_TABLET | Freq: Three times a day (TID) | ORAL | Status: DC
Start: 2018-08-05 — End: 2018-08-07
  Administered 2018-08-05 – 2018-08-07 (×5): 64.8 mg via ORAL
  Filled 2018-08-05 (×5): qty 2

## 2018-08-05 MED ORDER — NALOXONE HCL 0.4 MG/ML IJ SOLN (WRAP)
0.40 mg | INTRAMUSCULAR | Status: DC | PRN
Start: 2018-08-05 — End: 2018-08-07

## 2018-08-05 MED ORDER — SODIUM CHLORIDE (PF) 0.9 % IJ SOLN
3.00 mL | Freq: Three times a day (TID) | INTRAMUSCULAR | Status: DC
Start: 2018-08-05 — End: 2018-08-07
  Administered 2018-08-06 – 2018-08-07 (×4): 3 mL via INTRAVENOUS

## 2018-08-05 MED ORDER — THIAMINE HCL 100 MG/ML IJ SOLN
100.0000 mg | Freq: Once | INTRAMUSCULAR | Status: AC
Start: 2018-08-05 — End: 2018-08-05
  Administered 2018-08-05: 100 mg via INTRAVENOUS
  Filled 2018-08-05: qty 1

## 2018-08-05 NOTE — H&P (Signed)
Medicine History & Physical   [facility]  [employer]   Patient Name: [name], [name] LOS: 0 days   Attending Physician: [name], Autumn Shackleton., DO PCP: Autumn Saupe, MD      Assessment and Plan:                                                                Alcoholic Ketoacidosis  --bicarb infusion, then continue IV hydration  --check labs in AM  --discussed with Dr [name]  --pt has chronic acidosis and had bene on BIcarb which    Chronic alcoholism with liver cirrhosis  --CIWA, counseled, no plans of quitting  --continue aldactone    Chronic Pancreatitis  --continue pancrease    DVT PPx: Contraindicated   Dispo: Observation    Code: full code     History of Presenting Illness                                CC: acidosis  [name] is a 58 y.o. female patient who has chronic alcoholism, liver cirrhosis, hypertension, chronic pancreatitis who came to ER after a dog bite which was bleeding. This was taken cared of with pressure dressing. She had tetanus shot a year ago. She was noted to have bicarb of 10. She was advised admission. She stopped taking bicarb 2 weeks ago.  Past Medical History:   Diagnosis Date    Ascites     Asthma without status asthmaticus     Clostridium difficile colitis     Disorder of liver     multifocal cystic irregularities    DVT (deep venous thrombosis)     Gastroesophageal reflux disease     H/O ETOH abuse     sober 2 years until 03/2014    Hypertension     Hypokalemia     Pancreatitis     S/P IVC filter      Past Surgical History:   Procedure Laterality Date    CHOLECYSTECTOMY      COLONOSCOPY  12/26/2012    Procedure: COLONOSCOPY;  Surgeon: Autumn Spitz, MD;  Location: [facility];  Service: Gastroenterology;  Laterality: N/A;    COLONOSCOPY, POLYPECTOMY  12/26/2012    Procedure: COLONOSCOPY, POLYPECTOMY;  Surgeon: Autumn Spitz, MD;  Location: [facility];  Service: Gastroenterology;  Laterality: N/A;    PANCREATECTOMY   01/2011    partial    THORACENTESIS Right 2012    pl effusion       Family History   Adopted: Yes     Social History     Tobacco Use    Smoking status: Current Every Day Smoker     Packs/day: 0.50     Years: 30.00     Pack years: 15.00     Types: Cigarettes    Smokeless tobacco: Never Used   Substance Use Topics    Alcohol use: Yes     Comment: occasional    Drug use: No       Subjective   Review of Systems:  Review of systems as noted in HPI. Full 12 point ROS otherwise negative.         Objective   Physical Exam:  Vitals: T:97.7 F (36.5 C) (Temporal),  BP:(!) 141/101, HR:86, RR:16, SaO2:99%    1) General Appearance: Alert and oriented x 4. In no acute distress.   2) Eyes: Pink conjunctiva, anicteric sclera. Pupils are equally reactive to light.  3) ENT: Oral mucosa moist with no pharyngeal congestion, erythema or swelling.  4) Neck: Supple, with full range of motion. Trachea is central, no JVD noted  5) Chest: Clear to auscultation bilaterally, no wheezes or rhonchi.  6) CVS: normal rate and regular rhythm, with no murmurs.  7) Abdomen: Soft, non-tender, no palpable mass. Bowel sounds normal. No CVA tenderness  8) Extremities: No pitting edema, pulses palpable, no calf swelling and no gross deformity.  9) Skin: Warm, dry with normal skin turgor, no rash  10) Lymphatics: No lymphadenopathy in axillary, cervical and inguinal area.   11) Neurological: Cranial nerves II-XII intact. No gross focal motor or sensory deficits noted.  12) Psychiatric: Affect is appropriate. No hallucinations.        Patient Vitals for the past 12 hrs:   BP Temp Pulse Resp   08/05/18 1717 (!) 141/101 97.7 F (36.5 C) 86 16     Weight Monitoring 10/21/2015 11/23/2015 11/24/2015 11/25/2015 11/26/2015 11/27/2015 08/05/2018   Height 167.6 cm 167.6 cm - - - - 167.6 cm   Height Method Stated Stated - - - - Stated   Weight 87 kg 85.1 kg 84.3 kg 87 kg 87.408 kg 86.365 kg 69 kg   Weight Method Actual Actual - - Standing Scale Standing Scale  Standing Scale   BMI (calculated) 31 kg/m2 30.3 kg/m2 - - - - 24.6 kg/m2          Recent Results (from the past 24 hour(s))   I-Stat Chem 8 Notification    Collection Time: 08/05/18  5:36 PM   Result Value Ref Range    I-STAT Notification Istat Notification    CBC and differential    Collection Time: 08/05/18  5:53 PM   Result Value Ref Range    WBC 6.4 4.0 - 11.0 K/cmm    RBC 3.76 (L) 3.80 - 5.00 M/cmm    Hemoglobin 12.9 12.0 - 16.0 gm/dL    Hematocrit 54.0 98.1 - 48.0 %    MCV 107 (H) 80 - 100 fL    MCH 34 28 - 35 pg    MCHC 32 32 - 36 gm/dL    RDW 19.1 (H) 47.8 - 14.0 %    PLT CT 72 (L) 130 - 440 K/cmm    MPV 8.5 6.0 - 10.0 fL    NEUTROPHIL % 50.0 42.0 - 78.0 %    Lymphocytes 37.0 15.0 - 46.0 %    Monocytes 9.0 3.0 - 15.0 %    Eosinophils % 3.0 0.0 - 7.0 %    Basophils % 0.0 0.0 - 3.0 %    Bands 1 0 - 10 %    Neutrophils Absolute 3.3 1.7 - 8.6 K/cmm    Lymphocytes Absolute 2.4 0.6 - 5.1 K/cmm    Monocytes Absolute 0.6 0.1 - 1.7 K/cmm    Eosinophils Absolute 0.2 0.0 - 0.8 K/cmm    BASO Absolute 0.0 0.0 - 0.3 K/cmm    RBC Morphology RBC Morphology Reviewed     Macrocytic 2+    Basic Metabolic Panel    Collection Time: 08/05/18  5:53 PM   Result Value Ref Range    Sodium 136 136 - 147 mMol/L    Potassium 4.3 3.5 - 5.3  mMol/L    Chloride 99 98 - 110 mMol/L    CO2 10 (LL) 20 - 30 mMol/L    Calcium 8.5 8.5 - 10.5 mg/dL    Glucose 56 (L) 71 - 99 mg/dL    Creatinine 0.98 (H) 0.60 - 1.20 mg/dL    BUN 23 (H) 7 - 22 mg/dL    Anion Gap 11.9 (H) 7.0 - 18.0 mMol/L    BUN/Creatinine Ratio 17.0 10.0 - 30.0 Ratio    EGFR 44 (L) 60 - 150 mL/min/1.62m2    Osmolality Calculated 273 (L) 275 - 300 mOsm/kg   Hepatic function panel (LFT)    Collection Time: 08/05/18  5:53 PM   Result Value Ref Range    Protein, Total 8.6 (H) 6.0 - 8.3 gm/dL    Albumin 4.5 3.5 - 5.0 gm/dL    Alkaline Phosphatase 274 (H) 40 - 145 U/L    ALT 63 (H) 0 - 55 U/L    AST (SGOT) 168 (H) 10 - 42 U/L    Bilirubin, Total 1.7 (H) 0.1 - 1.2 mg/dL    Bilirubin, Direct  0.9 (H) 0.0 - 0.3 mg/dL    Albumin/Globulin Ratio 1.10 0.80 - 2.00 Ratio    Globulin 4.1 (H) 2.0 - 4.0 gm/dL   PT/APTT    Collection Time: 08/05/18  5:53 PM   Result Value Ref Range    PT 12.4 (H) 9.5 - 11.5 sec    PT INR 1.2 0.5 - 1.3    aPTT 25.2 24.0 - 34.0 sec   Ethanol (Alcohol) Level    Collection Time: 08/05/18  5:53 PM   Result Value Ref Range    Alcohol 209 (H) 0 - 9 mg/dL   Salicylate level    Collection Time: 08/05/18  5:53 PM   Result Value Ref Range    Salicylate Level <5.0 0.0 - 30.0 mg/dL   Beta-Hydroxybutyrate    Collection Time: 08/05/18  5:53 PM   Result Value Ref Range    Betahydroxybutyrate 5.86 (HH) 0.02 - 0.27 mMol/L   i-Stat Chem 8 CartrIDge    Collection Time: 08/05/18  6:26 PM   Result Value Ref Range    i-STAT Sodium 133 (L) 136 - 147 mMol/L    i-STAT Potassium 5.0 3.5 - 5.3 mMol/L    i-STAT Chloride 103 98 - 110 mMol/L    TCO2, ISTAT 14 (LL) 24 - 29 mMol/L    Ionized Ca, ISTAT 3.80 (L) 4.35 - 5.10 mg/dL    i-STAT Glucose 58 (L) 71 - 99 mg/dL    i-STAT Creatinine 1.47 (H) 0.60 - 1.20 mg/dL    i-STAT BUN 34 (H) 7 - 22 mg/dL    Anion Gap, ISTAT 82.9 (H) 7.0 - 16.0    EGFR 33 (L) 60 - 150 mL/min/1.71m2    i-STAT Hematocrit 39.0 36.0 - 48.0 %    i-STAT Hemoglobin 13.3 12.0 - 16.0 gm/dL   i-Stat CG4 Arterial CartrIDge    Collection Time: 08/05/18  8:54 PM   Result Value Ref Range    pH, ISTAT 7.28 (L) 7.35 - 7.45    PO2, ISTAT 74 (L) 75 - 100 mm Hg    BE, ISTAT  mMol/L     Unable to calculate result due to analyte out of the analytical measurement range    HCO3, ISTAT <14.1 (LL) 20.0 - 29.0 mMol/L    PCO2, ISTAT 24.8 (L) 35.0 - 45.0 mm Hg    O2 Sat, %, ISTAT  96 - 100 %  Unable to calculate result due to analyte out of the analytical measurement range    Room Number, ISTAT 53     i-STAT Allen's Test N/A     DELS, ISTAT Room Air     i-STAT FIO2 21.00 %    Sample, ISTAT Arterial     Site, ISTAT L Brachial     TCO2, ISTAT  24 - 29 mMol/L     Unable to calculate result due to analyte out of the  analytical measurement range    i-STAT Lactic acid 4.0 (HH) 0.5 - 1.9 mMol/L    Operator, ISTAT Operator: [phone] SCOTT RUTH RT    Urinalysis w Microscopic and Culture if Indicated    Collection Time: 08/05/18  9:49 PM   Result Value Ref Range    Color, UA Yellow Colorless,Yellow,Straw    Clarity, UA Clear Clear    Specific Gravity, UR 1.012 1.001 - 1.040    pH, Urine 6.0 5.0 - 8.0 pH    Protein, UR 30 (A) Negative mg/dL    Glucose, UA Negative Negative mg/dL    Ketones UA 20 (A) Negative,5 mg/dL    Bilirubin, UA Negative Negative    Blood, UA Negative Negative    Nitrite, UA Negative Negative    Urobilinogen, UA Normal Normal mg/dL    Leukocyte Esterase, UA Negative Negative Leu/uL    UR Micro Performed     WBC, UA 1 0 - 4 /hpf    RBC, UA 2 0 - 5 /hpf    Bacteria, UA Rare (A) None /hpf    Squam Epithel, UA 3 (H) 0 - 2 /hpf    Hyaline Casts, UA 3-5 (A) None /lpf        Allergies   Allergen Reactions    Morphine Nausea Only    Percocet [Oxycodone-Acetaminophen] Other (See Comments)     Makes her "loopy."      Xr Forearm Right 2 Views    Result Date: 08/05/2018  No evidence of acute fracture, dislocation or retained radiopaque foreign body. ReadingStation:[id number]     Home Medications     Med List Status:  Pharmacy Completed Set By: Judie Petit, PharmD at 08/05/2018  8:44 PM                albuterol (PROVENTIL HFA;VENTOLIN HFA) 108 (90 Base) MCG/ACT inhaler     Inhale 2 puffs into the lungs every 6 (six) hours as needed for Wheezing     furosemide (LASIX) 20 MG tablet     Take 20 mg by mouth daily as needed     ibuprofen (ADVIL,MOTRIN) 200 MG tablet     Take 400 mg by mouth every 6 (six) hours as needed for Pain.         Multiple Vitamins-Minerals (MULTIVITAMIN WITH MINERALS) tablet     Take 1 tablet by mouth every morning.        pancrelipase, Lip-Prot-Amyl, (CREON 24000) 24000-76000 units Cap DR Particles     Take 72,000 units of lipase by mouth 3 (three) times daily with meals     potassium chloride  (K-DUR,KLOR-CON) 20 MEQ tablet     Take 1 tablet (20 mEq total) by mouth 2 (two) times daily.     Patient taking differently:  Take 20 mEq by mouth daily        sodium bicarbonate 650 MG tablet     Take 650 mg by mouth 3 (three) times daily     spironolactone (ALDACTONE) 50 MG  tablet     Take 50 mg by mouth daily     vitamin B-1 (THIAMINE) 100 MG tablet     Take 100 mg by mouth daily         Meds given in the ED:  Medications   dextrose  5 % and 0.9 % NaCl infusion ( Intravenous New Bag 08/05/18 2148)   sodium chloride 0.9 % bolus 1,000 mL (1,000 mLs Intravenous New Bag 08/05/18 2212)   amoxicillin-clavulanate (AUGMENTIN) 875-125 MG per tablet 1 tablet (1 tablet Oral Given 08/05/18 1752)   thiamine (B-1) injection 100 mg (100 mg Intravenous Given 08/05/18 2147)      Time Spent:     Jenness Corner, MD     08/05/18,10:19 PM   MRN: [id number]                                      CSN: [id number] DOB: March 25, 1961

## 2018-08-05 NOTE — ED Notes (Addendum)
Called lab. They are running the Beta Hydroxybutyrate and then will run the magnesium

## 2018-08-05 NOTE — ED Notes (Signed)
Admitting At bedside.  

## 2018-08-05 NOTE — ED Triage Notes (Addendum)
Pt was bit by her roommates dog, rabies UTD, Tetanus within the last 1 year. It was about 24 hrs ago. Pt just moved here from [facility]

## 2018-08-05 NOTE — ED Notes (Signed)
Pt attempting to provide urine specimen at this time.  

## 2018-08-05 NOTE — ED Provider Notes (Signed)
EMERGENCY DEPARTMENT  History and Physical Exam       Patient Name: [name], [name]  Encounter Date:  08/05/2018  Attending Physician: Jane Canary, D.O.  PCP: Cain Saupe, MD  Patient DOB:  1960/10/05  MRN:  [id number]  Room:  E53/E53-A      History of Presenting Illness     Chief complaint: Dog bite    HPI/ROS is limited by: none  HPI/ROS given by: patient    [name] is a 58 y.o. female who presents to the ED with complaint of bite to the left forearm yesterday.  She states this is her roommates therapy dog.  His immunizations are up-to-date including rabies.  She states she comes in today because it is continuing to ooze.  She has been applying self bandage at home.  She has not lost any sensation of her forearm or hand.  No Loss of any motor strength.  Pt denies fevers chills sweats denies cough.           Review of Systems   Review of Systems   Constitutional: Negative for activity change, chills and fever.   HENT: Negative for congestion, ear pain, sinus pain, sore throat and trouble swallowing.    Eyes: Negative for redness.   Respiratory: Negative for cough, chest tightness, shortness of breath and wheezing.    Cardiovascular: Negative for chest pain and leg swelling.   Gastrointestinal: Negative for abdominal pain, constipation and diarrhea.   Genitourinary: Negative for difficulty urinating and urgency.   Musculoskeletal: Negative for neck stiffness.        2 lacerations to the left forearm   Skin: Negative for rash.   Neurological: Negative for weakness and numbness.   All other systems reviewed and are negative.       Other pertinent review of systems findings in history of present illness.  Allergies & Medications     Pt is allergic to morphine and percocet [oxycodone-acetaminophen].    Current/Home Medications    IBUPROFEN (ADVIL,MOTRIN) 200 MG TABLET    Take 400 mg by mouth every 6 (six) hours as needed for Pain.        MULTIPLE VITAMINS-MINERALS (MULTIVITAMIN WITH MINERALS)  TABLET    Take 1 tablet by mouth every morning.       POTASSIUM CHLORIDE (K-DUR,KLOR-CON) 20 MEQ TABLET    Take 1 tablet (20 mEq total) by mouth 2 (two) times daily.        Past Medical History     Pt   Past Medical History:   Diagnosis Date    Ascites     Asthma without status asthmaticus     Clostridium difficile colitis     Disorder of liver     multifocal cystic irregularities    DVT (deep venous thrombosis)     Gastroesophageal reflux disease     H/O ETOH abuse     sober 2 years until 03/2014    Hypertension     Hypokalemia     Pancreatitis     S/P IVC filter         Past Surgical History     Pt   Past Surgical History:   Procedure Laterality Date    CHOLECYSTECTOMY      COLONOSCOPY  12/26/2012    Procedure: COLONOSCOPY;  Surgeon: Gwenith Spitz, MD;  Location: [facility];  Service: Gastroenterology;  Laterality: N/A;    COLONOSCOPY, POLYPECTOMY  12/26/2012    Procedure: COLONOSCOPY, POLYPECTOMY;  Surgeon: Dorothea Ogle  L, MD;  Location: [facility];  Service: Gastroenterology;  Laterality: N/A;    PANCREATECTOMY  01/2011    partial    THORACENTESIS Right 2012    pl effusion        Family History     The family history is not on file. She was adopted.     Social History     Pt reports that she has been smoking cigarettes. She has a 15.00 pack-year smoking history. She has never used smokeless tobacco. She reports current alcohol use. She reports that she does not use drugs.     Physical Exam     Blood pressure (!) 141/101, pulse 86, temperature 97.7 F (36.5 C), temperature source Temporal, resp. rate 16, height 1.676 m, weight 69 kg, SpO2 99 %.    Vitals signs reviewed.    Physical Exam  Vitals signs and nursing note reviewed.   Constitutional:       General: She is not in acute distress.     Appearance: She is well-developed. She is not diaphoretic.   HENT:      Head: Normocephalic and atraumatic.      Nose: Nose normal.   Eyes:      Conjunctiva/sclera: Conjunctivae normal.   Neck:       Musculoskeletal: Normal range of motion and neck supple.      Vascular: No JVD.      Trachea: No tracheal deviation.   Cardiovascular:      Rate and Rhythm: Normal rate and regular rhythm.   Pulmonary:      Effort: Pulmonary effort is normal. No respiratory distress.      Breath sounds: Normal breath sounds.   Abdominal:      General: There is no distension.      Palpations: Abdomen is soft.      Tenderness: There is no abdominal tenderness. There is no guarding or rebound.   Musculoskeletal: Normal range of motion.   Lymphadenopathy:      Cervical: No cervical adenopathy.   Skin:     General: Skin is warm and dry.      Capillary Refill: Capillary refill takes less than 2 seconds.      Findings: No rash.      Comments: 3 cm and 2 cm lacerations that are irregular with clot on the top of them on the r forearm.  There is a venous ooze.  There is surrounding bruise.  There is 2+ radial and ulnar pulses on the right    There is a cap refill time is less than 2 seconds.   Neurological:      Mental Status: She is alert and oriented to person, place, and time.      Cranial Nerves: No cranial nerve deficit.      Sensory: No sensory deficit.      Coordination: Coordination normal.   Psychiatric:         Mood and Affect: Mood normal.         Behavior: Behavior normal.            Diagnostic Results     The results of the diagnostic studies have been reviewed by myself:    Radiologic Studies  Xr Forearm Right 2 Views    Result Date: 08/05/2018  No evidence of acute fracture, dislocation or retained radiopaque foreign body. ReadingStation:[id number]      Results     Procedure Component Value Units Date/Time    Ethanol (Alcohol) Level [[id number]]  (  Abnormal) Collected:  08/05/18 1753    Specimen:  Plasma Updated:  08/05/18 2034     Alcohol 209 mg/dL     Beta-Hydroxybutyrate [[id number]] Collected:  08/05/18 1753    Specimen:  Plasma Updated:  08/05/18 2032    Salicylate level [[id number]] Collected:  08/05/18 1753    Specimen:   Plasma Updated:  08/05/18 2032    CBC and differential [[id number]]  (Abnormal) Collected:  08/05/18 1753    Specimen:  Blood Updated:  08/05/18 2006     PLT CT PENDING     RBC Morphology PENDING     WBC 6.4 K/cmm      RBC 3.76 M/cmm      Hemoglobin 12.9 gm/dL      Hematocrit 72.5 %      MCV 107 fL      MCH 34 pg      MCHC 32 gm/dL      RDW 36.6 %      MPV 8.5 fL     Narrative:       Manual differential performed    Basic Metabolic Panel [[id number]]  (Abnormal) Collected:  08/05/18 1753    Specimen:  Plasma Updated:  08/05/18 1938     Sodium 136 mMol/L      Potassium 4.3 mMol/L      Chloride 99 mMol/L      CO2 10 mMol/L      Calcium 8.5 mg/dL      Glucose 56 mg/dL      Creatinine 9.56 mg/dL      BUN 23 mg/dL      Anion Gap 38.7 mMol/L      BUN/Creatinine Ratio 17.0 Ratio      EGFR 44 mL/min/1.15m2      Osmolality Calculated 273 mOsm/kg     Hepatic function panel (LFT) [[id number]]  (Abnormal) Collected:  08/05/18 1753    Specimen:  Plasma Updated:  08/05/18 1936     Protein, Total 8.6 gm/dL      Albumin 4.5 gm/dL      Alkaline Phosphatase 274 U/L      ALT 63 U/L      AST (SGOT) 168 U/L      Bilirubin, Total 1.7 mg/dL      Bilirubin, Direct 0.9 mg/dL      Albumin/Globulin Ratio 1.10 Ratio      Globulin 4.1 gm/dL     PT/APTT [[id number]]  (Abnormal) Collected:  08/05/18 1753    Specimen:  Blood Updated:  08/05/18 1900     PT 12.4 sec      PT INR 1.2     aPTT 25.2 sec     i-Stat Chem 8 CartrIDge [[id number]]  (Abnormal) Collected:  08/05/18 1826    Specimen:  Blood Updated:  08/05/18 1845     i-STAT Sodium 133 mMol/L      i-STAT Potassium 5.0 mMol/L      i-STAT Chloride 103 mMol/L      TCO2, ISTAT 14 mMol/L      Ionized Ca, ISTAT 3.80 mg/dL      i-STAT Glucose 58 mg/dL      i-STAT Creatinine 1.70 mg/dL      i-STAT BUN 34 mg/dL      Anion Gap, ISTAT 35.5     EGFR 33 mL/min/1.23m2      i-STAT Hematocrit 39.0 %      i-STAT Hemoglobin 13.3 gm/dL     I-Stat Chem 8 Notification [[id number]] Collected:  08/05/18 1736    Specimen:  ISTAT Updated:  08/05/18 1826     I-STAT Notification Istat Notification                         Consultation     8:37 PM  The patients history, PE, diagnostic studies and ED course were discussed and reviewed with the [facility] consultant who agrees to see the patient and admit to the hospital.        Medical Decision Making     The pt is a  58 year old female with a history of alcohol abuse and pancreatitis who presents emergency department today after being bit by dog 24 hours ago.  Patient was bit on the forearm.  Modena Jansky today because she thought the bleeding would stop but is persistently bruised and she was worried that she could not get the bleeding under control.      Differential diagnosis includes but is not limited to laceration, anemia, metabolic abnormalities, renal failure, liver failure, coagulopathies, thrombocytopenia, infection.    Patient was found to have a significant anion gap metabolic acidosis with a bicarb less than 10 and anion gap of 35.   Patient's glucose is however low.  Patient states that she is been prescribed bicarbonate by her primary care doctor in the past she has not been taking this for many months as she does not have insurance.  Patient does drink vodka however she denies drinking any ethylene glycol, would alcohol, methanol.  Denies any metformin.  Patient denies any illicit drug use.  Patient denies any salicylate use however she does state that she uses ibuprofen on a frequent basis.      The patient's wound was copiously irrigated with a liter of normal saline and then loosely approximated with 4 simple interrupted sutures to help with hemostasis.  I explained to the patient that this wound is at high risk for infection.  She was started on Augmentin p.o.      I discussed with the patient the need for admission due to her severe anion gap metabolic acidosis unknown etiology.  Initially she was hesitant however she has agreed to stay.  Patient be started on D5  normal saline for IV fluids and the hospitalist service was consulted and will see the patient and admit.      ED Medication Orders (From admission, onward)    Start Ordered     Status Ordering Provider    08/05/18 2020 08/05/18 2019  dextrose  5 % and 0.9 % NaCl infusion  Continuous     Route: Intravenous     Acknowledged VAN WIE, Wiley Flicker F JR.    08/05/18 1736 08/05/18 1735  amoxicillin-clavulanate (AUGMENTIN) 875-125 MG per tablet 1 tablet  Once in ED     Route: Oral  Ordered Dose: 875 mg of amoxicillin     Last MAR action:  Given VAN WIE, Felis Quillin F JR.              In addition to the above history, please see nursing notes. Allergies, meds, past medical, family, social hx, and the results of the diagnostic studies performed have been reviewed by myself.     Diagnosis / Disposition     Clinical Impression  1. High anion gap metabolic acidosis    2. Dog bite, initial encounter    3. Alcohol abuse        Disposition  ED Disposition     ED Disposition Condition Date/Time Comment    Admit  Mon Aug 05, 2018  8:21 PM             Follow up for Discharged Patients  No follow-up provider specified.    Prescriptions for Discharged Patients  New Prescriptions    No medications on file                       Procedures   LACERATION REPAIR   By Izora Gala., DO  8:42 PM    Location: forearm  Length: 5 cm  Layers: single  Contamination: clean  Suture(s):  3-0 ethilon    Verbal consent.  Patient identified and location of wound verified.  Tetanus is up-to-date.  Appropriate sterile precautions observed with saline, gloves and sterile drape.  Lidocaine 1% with epi given locally.  Wound and surrounding structures checked and no further trauma found.  Patient tolerated procedure well with no apparent complications.                    Jane Canary, D.O.    Note:  This chart was generated by the Epic EMR system/speech recognition and may contain inherent errors or omissions not intended by the user. Grammatical errors, random  word insertions, deletions, pronoun errors and incomplete sentences are occasional consequences of this technology due to software limitations. Not all errors are caught or corrected. If there are questions or concerns about the content of this note or information contained within the body of this dictation they should be addressed directly with the author for clarification        Izora Gala., DO  08/05/18 2045

## 2018-08-05 NOTE — ED Notes (Addendum)
Called RT for lactic ABG.

## 2018-08-05 NOTE — ED Notes (Addendum)
Called [facility]. Verbal order  Per Dr. Barnet Pall- to give 1L NS Bolus at this time. Will continue to monitor.

## 2018-08-05 NOTE — ED Notes (Addendum)
Placed lac tray at pt bedside table.irragated bite wound with sterile irrigation solution used a total of 

## 2018-08-05 NOTE — ED Notes (Addendum)
Called [facility], after 2nd failed attempt to start PIV.

## 2018-08-05 NOTE — ED Notes (Signed)
Attempted to start PIV. Was able to collect specimens, but not able to advance cath. Pt tolerated well. No bruising at the site

## 2018-08-06 LAB — CBC AND DIFFERENTIAL
Basophils %: 0.7 % (ref 0.0–3.0)
Basophils Absolute: 0 10*3/uL (ref 0.0–0.3)
Eosinophils %: 1.1 % (ref 0.0–7.0)
Eosinophils Absolute: 0.1 10*3/uL (ref 0.0–0.8)
Hematocrit: 33.7 % — ABNORMAL LOW (ref 36.0–48.0)
Hemoglobin: 11.3 gm/dL — ABNORMAL LOW (ref 12.0–16.0)
Lymphocytes Absolute: 1 10*3/uL (ref 0.6–5.1)
Lymphocytes: 20.3 % (ref 15.0–46.0)
MCH: 36 pg — ABNORMAL HIGH (ref 28–35)
MCHC: 34 gm/dL (ref 32–36)
MCV: 106 fL — ABNORMAL HIGH (ref 80–100)
MPV: 8.3 fL (ref 6.0–10.0)
Monocytes Absolute: 0.8 10*3/uL (ref 0.1–1.7)
Monocytes: 15.4 % — ABNORMAL HIGH (ref 3.0–15.0)
Neutrophils %: 62.5 % (ref 42.0–78.0)
Neutrophils Absolute: 3 10*3/uL (ref 1.7–8.6)
PLT CT: 57 10*3/uL — ABNORMAL LOW (ref 130–440)
RBC: 3.19 10*6/uL — ABNORMAL LOW (ref 3.80–5.00)
RDW: 14.5 % — ABNORMAL HIGH (ref 11.0–14.0)
WBC: 4.9 10*3/uL (ref 4.0–11.0)

## 2018-08-06 LAB — BASIC METABOLIC PANEL
Anion Gap: 19.6 mMol/L — ABNORMAL HIGH (ref 7.0–18.0)
BUN / Creatinine Ratio: 17.6 Ratio (ref 10.0–30.0)
BUN: 19 mg/dL (ref 7–22)
CO2: 19 mMol/L — ABNORMAL LOW (ref 20–30)
Calcium: 8.4 mg/dL — ABNORMAL LOW (ref 8.5–10.5)
Chloride: 102 mMol/L (ref 98–110)
Creatinine: 1.08 mg/dL (ref 0.60–1.20)
EGFR: 57 mL/min/{1.73_m2} — ABNORMAL LOW (ref 60–150)
Glucose: 105 mg/dL — ABNORMAL HIGH (ref 71–99)
Osmolality Calculated: 276 mOsm/kg (ref 275–300)
Potassium: 3.6 mMol/L (ref 3.5–5.3)
Sodium: 137 mMol/L (ref 136–147)

## 2018-08-06 LAB — MAGNESIUM: Magnesium: 1.2 mg/dL — ABNORMAL LOW (ref 1.6–2.6)

## 2018-08-06 MED ORDER — AMLODIPINE BESYLATE 5 MG PO TABS
5.0000 mg | ORAL_TABLET | Freq: Every day | ORAL | Status: DC
Start: 2018-08-06 — End: 2018-08-07
  Administered 2018-08-06 – 2018-08-07 (×2): 5 mg via ORAL
  Filled 2018-08-06 (×2): qty 1

## 2018-08-06 MED ORDER — MAGNESIUM OXIDE 400 MG TABS (WRAP)
400.0000 mg | ORAL_TABLET | Freq: Two times a day (BID) | ORAL | Status: DC
Start: 2018-08-06 — End: 2018-08-07
  Administered 2018-08-06 – 2018-08-07 (×2): 400 mg via ORAL
  Filled 2018-08-06 (×3): qty 1

## 2018-08-06 MED ORDER — SODIUM BICARBONATE 650 MG PO TABS
650.0000 mg | ORAL_TABLET | Freq: Three times a day (TID) | ORAL | Status: DC
Start: 2018-08-06 — End: 2018-08-07
  Administered 2018-08-06 – 2018-08-07 (×4): 650 mg via ORAL
  Filled 2018-08-06 (×6): qty 1

## 2018-08-06 MED ORDER — TRAMADOL HCL 50 MG PO TABS
50.0000 mg | ORAL_TABLET | Freq: Four times a day (QID) | ORAL | Status: DC | PRN
Start: 2018-08-06 — End: 2018-08-07
  Administered 2018-08-06 (×2): 50 mg via ORAL
  Filled 2018-08-06 (×2): qty 1

## 2018-08-06 MED ORDER — VH MAGNESIUM SULFATE 4 G IN 100 ML IV PREMIX
4.00 g | Freq: Once | INTRAVENOUS | Status: AC
Start: 2018-08-06 — End: 2018-08-06
  Administered 2018-08-06: 09:00:00 4 g via INTRAVENOUS
  Filled 2018-08-06: qty 100

## 2018-08-06 MED ORDER — CLONIDINE HCL 0.1 MG PO TABS
0.1000 mg | ORAL_TABLET | Freq: Three times a day (TID) | ORAL | Status: DC | PRN
Start: 2018-08-06 — End: 2018-08-07

## 2018-08-06 MED ORDER — KETOROLAC TROMETHAMINE 30 MG/ML IJ SOLN
30.00 mg | Freq: Four times a day (QID) | INTRAMUSCULAR | Status: DC | PRN
Start: 2018-08-06 — End: 2018-08-07
  Administered 2018-08-06 – 2018-08-07 (×3): 30 mg via INTRAVENOUS
  Filled 2018-08-06 (×3): qty 1

## 2018-08-06 NOTE — Consults (Signed)
[facility]  NEPHROLOGY CONSULTATION NOTE    Date Time: 08/06/18 9:38 AM  Patient Name: [name], [name]  MRN#: [id number]  DOB: [birth year]  Requesting Physician: Obie Dredge, *      Reason for Consultation:   AKI    Assessment:   AKI  Acidosis  Anemia  HTN    Plan:   *AKI: Due to pre-renal etiology in the setting of decreased oral intake. Creatinine had worsened from 0.7 to 1.3. Improving with IV hydration to 1.0 today.  -Will monitor renal function with you. Can continue IV hydration for another 12 hours then stop.  -Avoid nephrotoxic medications including contrast as much as possible.  -Maintain MAP atleast 65 mm hg    *Acidosis,AG: Due to decreased oral intake leading to ketoacidosis. Improved with IV fluids. Now back on oral sodium bicarbonate.  -Will continue with sodium bicarbonate  -Will continue to monitor    *Anemia: Mild.   -Will check iron studies, vitamiin b12 and folate  -Will continue to monitor    *HTN: Currently elevated above normal range.  -Will start amlodipine 5 mg once daily  -Will continue to monitor    History:   [name] is a 58 y.o. female with PMH of chronic alcoholism, liver cirrhosis, controlled HTN, chronic pancreatitis  who presents to the hospital on 08/05/2018 with with dog bite that was bleeding. Found to have serum bicarbonate of 10. She was previously on sodium bicarb supplements which she stopped taking because she ran out of medications. X ray of forearm on admission was unremarkable.    She feels better today. Pt denies any CP/SOB/N/V/D/abd pain/fever/chills/cough/sputum/dysuria/hematuria      Past Medical History:     Past Medical History:   Diagnosis Date    Ascites     Asthma without status asthmaticus     Clostridium difficile colitis     Disorder of liver     multifocal cystic irregularities    DVT (deep venous thrombosis)     Gastroesophageal reflux disease     H/O ETOH abuse     sober 2 years until 03/2014     Hypertension     Hypokalemia     Pancreatitis     S/P IVC filter        Past Surgical History:     Past Surgical History:   Procedure Laterality Date    CHOLECYSTECTOMY      COLONOSCOPY  12/26/2012    Procedure: COLONOSCOPY;  Surgeon: Gwenith Spitz, MD;  Location: [facility];  Service: Gastroenterology;  Laterality: N/A;    COLONOSCOPY, POLYPECTOMY  12/26/2012    Procedure: COLONOSCOPY, POLYPECTOMY;  Surgeon: Gwenith Spitz, MD;  Location: [facility];  Service: Gastroenterology;  Laterality: N/A;    PANCREATECTOMY  01/2011    partial    THORACENTESIS Right 2012    pl effusion       Family History:     Family History   Adopted: Yes       Social History:     Social History     Socioeconomic History    Marital status: Single     Spouse name: Not on file    Number of children: Not on file    Years of education: Not on file    Highest education level: Not on file   Occupational History    Not on file   Social Needs    Financial resource strain: Not on file    Food insecurity:  Worry: Not on file     Inability: Not on file    Transportation needs:     Medical: Not on file     Non-medical: Not on file   Tobacco Use    Smoking status: Current Every Day Smoker     Packs/day: 0.50     Years: 30.00     Pack years: 15.00     Types: Cigarettes    Smokeless tobacco: Never Used   Substance and Sexual Activity    Alcohol use: Yes     Comment: occasional    Drug use: No    Sexual activity: Not on file   Lifestyle    Physical activity:     Days per week: Not on file     Minutes per session: Not on file    Stress: Not on file   Relationships    Social connections:     Talks on phone: Not on file     Gets together: Not on file     Attends religious service: Not on file     Active member of club or organization: Not on file     Attends meetings of clubs or organizations: Not on file     Relationship status: Not on file    Intimate partner violence:     Fear of current or ex partner: Not on file      Emotionally abused: Not on file     Physically abused: Not on file     Forced sexual activity: Not on file   Other Topics Concern    Not on file   Social History Narrative    Not on file       Allergies:     Allergies   Allergen Reactions    Morphine Nausea Only    Percocet [Oxycodone-Acetaminophen] Other (See Comments)     Makes her "loopy."       Medications:     Current Facility-Administered Medications   Medication Dose Route Frequency    folic acid  1 mg Oral Daily    magnesium oxide  400 mg Oral BID    magnesium sulfate  4 g Intravenous Once    multivitamin  1 tablet Oral Daily    pancrelipase (Lip-Prot-Amyl)  72,000 units of lipase Oral TID MEALS    PHENobarbital  64.8 mg Oral Q8H    sodium bicarbonate  650 mg Oral TID    sodium chloride (PF)  3 mL Intravenous Q8H    vitamin B-1  100 mg Oral Daily       Review of Systems:   As per HPI, all other systems were reviewed and were negative.    Physical Exam:     Vitals:    08/06/18 0658   BP: (!) 161/101   Pulse: 97   Resp: 17   Temp: 98.4 F (36.9 C)   SpO2: 97%       Intake and Output Summary (Last 24 hours) at Date Time    Intake/Output Summary (Last 24 hours) at 08/06/2018 9147  Last data filed at 08/06/2018 8295  Gross per 24 hour   Intake 1858.33 ml   Output    Net 1858.33 ml       General appearance - alert, well appearing, and in no distress  Mental status - alert, oriented to person, place, and time  Eyes - pupils equal and reactive, extraocular eye movements intact  Mouth - mucous membranes moist, pharynx normal without lesions  Neck -  supple, no significant adenopathy  Lymphatics - no palpable lymphadenopathy, no hepatosplenomegaly  Chest - clear to auscultation, no wheezes, rales or rhonchi, symmetric air entry  Heart - normal rate, regular rhythm, normal S1, S2, no murmurs, rubs, clicks or gallops  Abdomen - soft, nontender, nondistended, no masses or organomegaly  Neurological - alert, oriented, normal speech, no focal findings or  movement disorder noted  Musculoskeletal - no joint tenderness, deformity or swelling  Extremities - peripheral pulses normal, no pedal edema, no clubbing or cyanosis  Skin - normal coloration and turgor, no rashes, no suspicious skin lesions noted    Labs Reviewed:     Results     Procedure Component Value Units Date/Time    Magnesium [[id number]]  (Abnormal) Collected:  08/06/18 0519    Specimen:  Plasma Updated:  08/06/18 0728     Magnesium 1.2 mg/dL     Basic Metabolic Panel [[id number]]  (Abnormal) Collected:  08/06/18 0519    Specimen:  Plasma Updated:  08/06/18 0728     Sodium 137 mMol/L      Potassium 3.6 mMol/L      Chloride 102 mMol/L      CO2 19 mMol/L      Calcium 8.4 mg/dL      Glucose 782 mg/dL      Creatinine 9.56 mg/dL      BUN 19 mg/dL      Anion Gap 21.3 mMol/L      BUN/Creatinine Ratio 17.6 Ratio      EGFR 57 mL/min/1.83m2      Osmolality Calculated 276 mOsm/kg     CBC and differential [[id number]]  (Abnormal) Collected:  08/06/18 0519    Specimen:  Blood Updated:  08/06/18 0726     WBC 4.9 K/cmm      RBC 3.19 M/cmm      Hemoglobin 11.3 gm/dL      Hematocrit 62.9 %      MCV 106 fL      MCH 36 pg      MCHC 34 gm/dL      RDW 52.8 %      PLT CT 57 K/cmm      MPV 8.3 fL      NEUTROPHIL % 62.5 %      Lymphocytes 20.3 %      Monocytes 15.4 %      Eosinophils % 1.1 %      Basophils % 0.7 %      Neutrophils Absolute 3.0 K/cmm      Lymphocytes Absolute 1.0 K/cmm      Monocytes Absolute 0.8 K/cmm      Eosinophils Absolute 0.1 K/cmm      BASO Absolute 0.0 K/cmm     Magnesium [[id number]]  (Abnormal) Collected:  08/05/18 1753    Specimen:  Plasma Updated:  08/05/18 2306     Magnesium 1.5 mg/dL     Beta-Hydroxybutyrate [[id number]]  (Abnormal) Collected:  08/05/18 1753    Specimen:  Plasma Updated:  08/05/18 2215     Betahydroxybutyrate 5.86 mMol/L     Urinalysis w Microscopic and Culture if Indicated [[id number]]  (Abnormal) Collected:  08/05/18 2149    Specimen:  Urine, Random Updated:  08/05/18 2205     Color, UA  Yellow     Clarity, UA Clear     Specific Gravity, UR 1.012     pH, Urine 6.0 pH      Protein, UR 30 mg/dL      Glucose, UA Negative mg/dL  Ketones UA 20 mg/dL      Bilirubin, UA Negative     Blood, UA Negative     Nitrite, UA Negative     Urobilinogen, UA Normal mg/dL      Leukocyte Esterase, UA Negative Leu/uL      UR Micro Performed     WBC, UA 1 /hpf      RBC, UA 2 /hpf      Bacteria, UA Rare /hpf      Squam Epithel, UA 3 /hpf      Hyaline Casts, UA 3-5 /lpf     Salicylate level [[id number]] Collected:  08/05/18 1753    Specimen:  Plasma Updated:  08/05/18 2146     Salicylate Level <5.0 mg/dL     i-Stat CG4 Arterial CartrIDge [[id number]]  (Abnormal) Collected:  08/05/18 2054    Specimen:  Arterial Updated:  08/05/18 2104     pH, ISTAT 7.28     PO2, ISTAT 74 mm Hg      BE, ISTAT       Unable to calculate result due to analyte out of the analytical measurement range     mMol/L     HCO3, ISTAT <14.1 mMol/L      PCO2, ISTAT 24.8 mm Hg      O2 Sat, %, ISTAT       Unable to calculate result due to analyte out of the analytical measurement range     %     Room Number, ISTAT 53     i-STAT Allen's Test N/A     DELS, ISTAT Room Air     i-STAT FIO2 21.00 %      Sample, ISTAT Arterial     Site, ISTAT L Brachial     TCO2, ISTAT       Unable to calculate result due to analyte out of the analytical measurement range     mMol/L     i-STAT Lactic acid 4.0 mMol/L      Operator, ISTAT Operator: [id number] RT    CBC and differential [[id number]]  (Abnormal) Collected:  08/05/18 1753    Specimen:  Blood Updated:  08/05/18 2056     WBC 6.4 K/cmm      RBC 3.76 M/cmm      Hemoglobin 12.9 gm/dL      Hematocrit 08.6 %      MCV 107 fL      MCH 34 pg      MCHC 32 gm/dL      RDW 57.8 %      PLT CT 72 K/cmm      MPV 8.5 fL      NEUTROPHIL % 50.0 %      Lymphocytes 37.0 %      Monocytes 9.0 %      Eosinophils % 3.0 %      Basophils % 0.0 %      Bands 1 %      Neutrophils Absolute 3.3 K/cmm      Lymphocytes Absolute 2.4 K/cmm       Monocytes Absolute 0.6 K/cmm      Eosinophils Absolute 0.2 K/cmm      BASO Absolute 0.0 K/cmm      RBC Morphology RBC Morphology Reviewed     Macrocytic 2+    Narrative:       Manual differential performed    Ethanol (Alcohol) Level [[id number]]  (Abnormal) Collected:  08/05/18 1753    Specimen:  Plasma Updated:  08/05/18  2034     Alcohol 209 mg/dL     Basic Metabolic Panel [[id number]]  (Abnormal) Collected:  08/05/18 1753    Specimen:  Plasma Updated:  08/05/18 1938     Sodium 136 mMol/L      Potassium 4.3 mMol/L      Chloride 99 mMol/L      CO2 10 mMol/L      Calcium 8.5 mg/dL      Glucose 56 mg/dL      Creatinine 3.47 mg/dL      BUN 23 mg/dL      Anion Gap 42.5 mMol/L      BUN/Creatinine Ratio 17.0 Ratio      EGFR 44 mL/min/1.6m2      Osmolality Calculated 273 mOsm/kg     Hepatic function panel (LFT) [[id number]]  (Abnormal) Collected:  08/05/18 1753    Specimen:  Plasma Updated:  08/05/18 1936     Protein, Total 8.6 gm/dL      Albumin 4.5 gm/dL      Alkaline Phosphatase 274 U/L      ALT 63 U/L      AST (SGOT) 168 U/L      Bilirubin, Total 1.7 mg/dL      Bilirubin, Direct 0.9 mg/dL      Albumin/Globulin Ratio 1.10 Ratio      Globulin 4.1 gm/dL     PT/APTT [[id number]]  (Abnormal) Collected:  08/05/18 1753    Specimen:  Blood Updated:  08/05/18 1900     PT 12.4 sec      PT INR 1.2     aPTT 25.2 sec     i-Stat Chem 8 CartrIDge [[id number]]  (Abnormal) Collected:  08/05/18 1826    Specimen:  Blood Updated:  08/05/18 1845     i-STAT Sodium 133 mMol/L      i-STAT Potassium 5.0 mMol/L      i-STAT Chloride 103 mMol/L      TCO2, ISTAT 14 mMol/L      Ionized Ca, ISTAT 3.80 mg/dL      i-STAT Glucose 58 mg/dL      i-STAT Creatinine 1.70 mg/dL      i-STAT BUN 34 mg/dL      Anion Gap, ISTAT 01.0     EGFR 33 mL/min/1.14m2      i-STAT Hematocrit 39.0 %      i-STAT Hemoglobin 13.3 gm/dL     I-Stat Chem 8 Notification [[id number]] Collected:  08/05/18 1736    Specimen:  ISTAT Updated:  08/05/18 1826     I-STAT Notification Istat  Notification              Rads:   Radiological Procedure reviewed.   Xr Forearm Right 2 Views    Result Date: 08/05/2018  No evidence of acute fracture, dislocation or retained radiopaque foreign body. ReadingStation:[id number]

## 2018-08-06 NOTE — Progress Notes (Addendum)
Medicine Progress Note   [name]  [employer]   Patient Name: [name], [name] LOS: 0 days   Attending Physician: [name], * PCP: Autumn Saupe, MD      Hospital Course:                                                            [name] is a 58 y.o. female patient who has chronic alcoholism, liver cirrhosis, hypertension, chronic pancreatitis , presented to ER after a dog bite which was bleeding. This was taken cared of with pressure dressing. She had tetanus shot a year ago. She was noted to have bicarb of 10. She was advised admission.  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  She stopped taking bicarb 2 weeks ago.     Assessment and Plan:    Anion gap metabolic acidosis secondary to alcoholic Ketoacidosis and lactic acidosis  --Patient alcohol level was greater than 200s and endorses ingestion of alcohol  --He was hypoglycemic essentially alcohol induced starvation ketoacidosis with accompanying lactic acidosis  --ABG shows partially compensated probably acute on chronic metabolic acidosis  --Debated with hydroxybutyric acid and lactic acid level on admission.  --Improving with IV hydration and bicarb infusion  --Continue on D5 normal saline  --Follow-up BMP in a.m.    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     Right forearm dog bite,   --There is a roommate's dog which is therapy dog apparently the dog was trying to protect her roommate   --Was initially met in the ER noted 2 cm and 2 cm laceration which was single venous blood that was evaluated  --Wound is dressed  --Patient  is up-to-date on tetanus--  --reportedly the dog is up-to-date on rabies vaccine   --Afebrile  --Continue Augmentin    Elevated blood pressure  --Likely secondary to alcohol withdrawal  --.  Had clonidine started  as needed     Chronic alcoholism   --CIWA protocol with driven benzodiazepine administration  --On phenobarb for prophylaxis  --, counseled, no plans of quitting  --Continue thiamine and low folic acid    Hypomagnesemia  --Replete with p.o. and IV magnesium replacement and recheck in a.m.    Alcoholic cirrhosiscomplicated with prior thrombocytopenia and portal hypertension including history of ascites  --Patient is compensated  --Resume spironolactone and furosemide on discharge  --Platelet counts downtrending initially was hemoconcentration  --Has no bleeding or any significant ascites.    Chronic Pancreatitis status post partial pancreatectomy  --continue pancrease    Disposition: Anticipate discharge in 1 day  DVT PPX:  Mechanical; thrombocytopenia  Code:  Full Code     Subjective   Patient main complaint is pain in her right forearm.  Denies any fever or chills.  Denies chest pain or shortness of breath-denies any hallucination.  Denies any anxiety or feeling depressed.         Objective   Physical  Exam:       Vitals: T:98.4 F (36.9 C) (Oral), BP:(!) 161/101, HR:97, RR:17, SaO2:97%    General: Patient is awake. In no acute distress.  HEENT: No conjunctival drainage, vision is intact, anicteric sclera.  Neck: Supple, no thyromegaly.  Chest: CTA bilaterally. No rhonchi, no wheezing. No use of accessory muscles.  CVS: Normal rate and regular rhythm no murmurs, without JVD, no pitting edema, pulses palpable.  Abdomen: Soft, non-tender, no guarding or rigidity, with normal bowel sounds.  Extremities: No calf swelling and no gross deformity.  Skin: Warm, dry, no rash , right forearm dog bite wound is dressed not remove the dressing at this time; no distal erythema change normal capillary refill and radial  pulse palpable no hypoesthesia or distal neurologic deficit.  NEURO: No motor or sensory deficits.  Psychiatric: Alert, interactive, appropriate, normal affect.  Weight Monitoring 11/23/2015 11/24/2015 11/25/2015 11/26/2015 11/27/2015 08/05/2018 08/06/2018   Height 167.6 cm - - - - 167.6 cm 167.6 cm   Height Method Stated - - - - Stated Stated   Weight 85.1 kg 84.3 kg 87 kg 87.408 kg 86.365 kg 69 kg 69.083 kg   Weight Method Actual - - Standing Scale Standing Scale Standing Scale Standing Scale   BMI (calculated) 30.3 kg/m2 - - - - 24.6 kg/m2 24.6 kg/m2         Intake/Output Summary (Last 24 hours) at 08/06/2018 1236  Last data filed at 08/06/2018 0900  Gross per 24 hour   Intake 1978.33 ml   Output --   Net 1978.33 ml     Body mass index is 24.58 kg/m.     Meds:     Current Facility-Administered Medications   Medication Dose Route Frequency    folic acid  1 mg Oral Daily    magnesium oxide  400 mg Oral BID    multivitamin  1 tablet Oral Daily    pancrelipase (Lip-Prot-Amyl)  72,000 units of lipase Oral TID MEALS    PHENobarbital  64.8 mg Oral Q8H    sodium bicarbonate  650 mg Oral TID    sodium chloride (PF)  3 mL Intravenous Q8H    vitamin B-1  100 mg Oral Daily      dextrose 5 % and 0.9% NaCl Stopped (08/06/18 0110)     PRN Meds: cloNIDine, diazePAM, diazePAM, naloxone, ondansetron **OR** ondansetron.     LABS:     Estimated Creatinine Clearance: 53.2 mL/min (based on SCr of 1.08 mg/dL).  Recent Labs   Lab 08/06/18  0519 08/05/18  1753   WBC 4.9 6.4   RBC 3.19* 3.76*   Hemoglobin 11.3* 12.9   Hematocrit 33.7* 40.2   MCV 106* 107*   PLT CT 57* 72*     Recent Labs   Lab 08/05/18  1753   PT 12.4*   PT INR 1.2   aPTT 25.2         Lab Results   Component Value Date    HGBA1CPERCNT 5.3 04/19/2012     Recent Labs   Lab 08/06/18  0519 08/05/18  1826 08/05/18  1753   Glucose 105*  --  56*   Sodium 137  --  136   Potassium 3.6  --  4.3   Chloride 102  --  99   CO2 19*  --  10*   BUN 19  --  23*   Creatinine 1.08  --   1.35*   i-STAT Creatinine  --  1.70*  --  EGFR 57* 33* 44*   Calcium 8.4*  --  8.5     Recent Labs   Lab 08/06/18  0519 08/05/18  1753   Magnesium 1.2* 1.5*   Albumin  --  4.5   Protein, Total  --  8.6*   Bilirubin, Total  --  1.7*   Alkaline Phosphatase  --  274*   ALT  --  63*   AST (SGOT)  --  168*     Recent Labs   Lab 08/05/18  2149   Specific Gravity, UR 1.012   pH, Urine 6.0   Protein, UR 30*   Glucose, UA Negative   Ketones UA 20*   Bilirubin, UA Negative   Blood, UA Negative   Nitrite, UA Negative   Urobilinogen, UA Normal   Leukocyte Esterase, UA Negative   WBC, UA 1   RBC, UA 2   Bacteria, UA Rare*      Patient Lines/Drains/Airways Status    Active PICC Line / CVC Line / PIV Line / Drain / Airway / Intraosseous Line / Epidural Line / ART Line / Line / Wound / Pressure Ulcer / NG/OG Tube     Name:   Placement date:   Placement time:   Site:   Days:    Peripheral IV 08/05/18 Left Antecubital   08/05/18    2122    Antecubital   less than 1    Wound 08/06/18 Other (Comment) Arm   08/06/18    0045    Arm   less than 1               Xr Forearm Right 2 Views    Result Date: 08/05/2018  No evidence of acute fracture, dislocation or retained radiopaque foreign body. ReadingStation:[id number]     Home Health Needs:  There are no questions and answers to display.       Nutrition assessment done in collaboration with Registered Dietitians:     Time spent:      Autumn Dredge, MD     08/06/18,12:36 PM   MRN: [id number]                                      CSN: [id number] DOB: Mar 23, 1961

## 2018-08-06 NOTE — ED Notes (Addendum)
Pt is alert and oriented x4. CTA. CIWA of 7. Phenobarbital given. Today is patients birthday. Next CIWA at 7:48am

## 2018-08-06 NOTE — UM Notes (Signed)
[facility] Utilization Management Review Sheet    Facility :  [facility]    NAME: [name]  MR#: [id number]    CSN#: [id number]    ROOM: 501/501-A AGE: 57 y.o.    ADMIT DATE AND TIME: 08/05/2018  5:18 PM      PATIENT CLASS: Observation     ATTENDING PHYSICIAN: Obie Dredge, *  PAYOR:Payor: /     DIAGNOSIS:     ICD-10-CM    1. High anion gap metabolic acidosis E87.2    2. Dog bite, initial encounter W54.0XXA    3. Alcohol abuse F10.10        HISTORY:   Past Medical History:   Diagnosis Date    Ascites     Asthma without status asthmaticus     Clostridium difficile colitis     Disorder of liver     multifocal cystic irregularities    DVT (deep venous thrombosis)     Gastroesophageal reflux disease     H/O ETOH abuse     sober 2 years until 03/2014    Hypertension     Hypokalemia     Pancreatitis     S/P IVC filter        DATE OF REVIEW: 08/06/2018    VITALS: BP (!) 161/101    Pulse 97    Temp 98.4 F (36.9 C) (Oral)    Resp 17    Ht 1.676 m (5\' 6" )    Wt 69.1 kg (152 lb 4.8 oz)    SpO2 97%    BMI 24.58 kg/m     Active Hospital Problems    Diagnosis    Alcoholic ketoacidosis     Admitted via the ED "[name] is a 58 y.o. female who presents to the ED with complaint of bite to the left forearm yesterday.  She states this is her roommates therapy dog.  His immunizations are up-to-date including rabies.  She states she comes in today because it is continuing to ooze.  She has been applying self bandage at home.  She has not lost any sensation of her forearm or hand.  No Loss of any motor strength.  Pt denies fevers chills sweats denies cough."    Labs: magnesium 1.2, betahydroxybutyrate 5.86, alcohol 209, alkaline phosphatase 274, ALT 63, AST 1689, total bilirubin 1.7, CO2 10, glucose 56, creatinine 1.35, BUN 23, anion gap 32.3, EGFR 44, platelets 57, ph 7.28, HCO3 <14.1, PCO2 24.8, lactic acid 4.0    Protein, UR 30Abnormal   Negative mg/dL Final   Glucose, UA  Negative  Negative mg/dL Final   Ketones UA 30QMVHQION   Negative,5 mg/dL Final   Bilirubin, UA Negative  Negative Final   Blood, UA Negative  Negative Final   Nitrite, UA Negative  Negative Final   Urobilinogen, UA Normal  Normal mg/dL Final   Leukocyte Esterase, UA Negative  Negative Leu/uL Final   UR Micro Performed   Final   WBC, UA 1  0 - 4 /hpf Final   RBC, UA 2  0 - 5 /hpf Final   Bacteria, UA RareAbnormal   None /hpf Final   Squam Epithel, UA 3High   0 - 2 /hpf Final   Hyaline Casts, UA 3-5Abnormal   None /lpf Final     Meds: augmentin x 1 in ED, folvite qd, mag-ox bid, mag sulfate ivpb x 1, cerovite qd, creon 24000 tid, luminal q8h, sodium bicarb tid, IV fluid bolus x 1 liter in  ED, thiamine inj x 1 in ED, thiamine po qd, IV fluid, sodium bicarb infusion, valium x 1    Plans: IV fluids, bicarb infusion, CIWA protocol, pain and nausea management, dvt prophylaxis, seizure precautions, aspiration precautions. Fall precautions, nephrology consult,     Alvin Critchley RN  [facility]  Utilization Review  P [phone]  F [phone]

## 2018-08-06 NOTE — Plan of Care (Addendum)
NURSE NOTE SUMMARY  [facility] - GI/ENDO/GEN MED   Patient Name: [name]   Attending Physician: Jenness Corner, MD   Today's date:   08/06/2018 LOS: 0 days   Shift Summary:                                                              0040: Pt arrived to floor from ED. Assessment complete. Pt has no s/s distress or pain at this time. Oriented pt to unit and use of the call bell. IVF infusing per MAR. Bed alarm is on, bed is in the lowest position and call bell is within reach. Pt denies any needs at this time. Will continue to monitor.   0330: CIWA score 9, pt complaining her tremors are making it difficult for her to get comfortable.   0600: Pt complaining that she was having throbbing pain at the site of her dog bite. Wound site assessed and cleansed. Dressing changed. Pt complaining that her tremors are making it difficult for her to sleep and get comfortable. Based on CIWA score of 6 pt not eligible for any valium at this time. Will continue to monitor.    Provider Notifications:      Rapid Response Notifications:  Mobility:      PMP Activity: Step 6 - Walks in Room (08/06/2018 12:44 AM)     Weight tracking:  Family Dynamic:   Last 3 Weights for the past 72 hrs (Last 3 readings):   Weight   08/06/18 0040 69.1 kg (152 lb 4.8 oz)   08/05/18 1717 69 kg (152 lb 1.9 oz)             Recent Vitals Last Bowel Movement   BP:   (!) 146/94 (08/06/2018 12:40 AM)  Heart Rate: 84 (08/06/2018 12:40 AM)  Temp: 97.6 F (36.4 C) (08/06/2018 12:40 AM)  Resp Rate: 17 (08/06/2018 12:40 AM)  Height: 1.676 m (5\' 6" ) (08/06/2018 12:40 AM)  Weight: 69.1 kg (152 lb 4.8 oz) (08/06/2018 12:40 AM)  SpO2: 97 % (08/06/2018 12:40 AM)   No data recorded         Problem: Moderate/High Fall Risk Score >5  Goal: Patient will remain free of falls  Outcome: Progressing     Problem: Addiction to alcohol or opioids or other substances AS EVIDENCED BY:  Goal: Will identify long and short term goals based on individual needs and  strengths  Outcome: Progressing  Flowsheets (Taken 08/06/2018 0137)  Pt will identify long and short term treatment goals based on individual needs and strengths : Assist to identify goals for treatment based on individual needs and strengths; Assist patient to define criteria for discharge; Assist patient to sign acknowledgement of treatment plan participation  Goal: Patient achieves a safe detoxification and management of withdrawal symptoms  Outcome: Progressing  Flowsheets (Taken 08/06/2018 0137)  Patient achieves a safe detoxification and management of withdrawal symptoms: Assess withdrawal signs/symptoms according to identified protocol (e.g. CIWA/COWS); Provide medication teaching including name, dosage, benefits, action, effect and side effects; Assess effectiveness (relief of withdrawal symptoms) and side effects of medication; Educate about health risks associated with withdrawal (e.g. seizures, DTs); Ensure laboratory results are reviewed with the LIP to increase understanding of the medical consequences of addiction; Ensure adequate hydration and  nutrition during withdrawal; Educate about the importance of reporting dizziness or unsteadiness while ambulating to care providers (e.g. RN, LIP)  Goal: Patient educated about the disease of addiction and recovery and identifies long-term goal  Outcome: Progressing  Flowsheets (Taken 08/06/2018 0137)  Patient educated about the disease of addiction and recovery and identifies long-term goal: Educate about the characteristics of alcohol/substance abuse/dependence; Assist patient to identify coping strategies to use as alternatives to alcohol/substance abuse/dependency  Goal: Patient develops a discharge plan  Outcome: Progressing  Flowsheets (Taken 08/06/2018 0137)  Patient develops a discharge plan: Review healthy diversion activities that promote abstinence from alcohol/substances; Review community resources and social supports that promote sober living      Problem: Fluid and Electrolyte Imbalance/ Endocrine  Goal: Fluid and electrolyte balance are achieved/maintained  Outcome: Progressing  Flowsheets (Taken 08/06/2018 0137)  Fluid and electrolyte balance are achieved/maintained: Monitor intake and output every shift; Monitor/assess lab values and report abnormal values; Provide adequate hydration; Assess for confusion/personality changes; Assess and reassess fluid and electrolyte status; Observe for seizure activity and initiate seizure precautions if indicated; Monitor for muscle weakness

## 2018-08-06 NOTE — ED Notes (Signed)
Pt to room #501 via stretcher with EDT...... IVF infusing.... dispo note by Jenne Campus RN

## 2018-08-07 LAB — BASIC METABOLIC PANEL
Anion Gap: 12.1 mMol/L (ref 7.0–18.0)
Anion Gap: 13.4 mMol/L (ref 7.0–18.0)
BUN / Creatinine Ratio: 16.4 Ratio (ref 10.0–30.0)
BUN / Creatinine Ratio: 12.7 Ratio (ref 10.0–30.0)
BUN: 12 mg/dL (ref 7–22)
BUN: 9 mg/dL (ref 7–22)
CO2: 25 mMol/L (ref 20–30)
CO2: 24 mMol/L (ref 20–30)
Calcium: 8.6 mg/dL (ref 8.5–10.5)
Calcium: 8.5 mg/dL (ref 8.5–10.5)
Chloride: 101 mMol/L (ref 98–110)
Chloride: 101 mMol/L (ref 98–110)
Creatinine: 0.73 mg/dL (ref 0.60–1.20)
Creatinine: 0.71 mg/dL (ref 0.60–1.20)
EGFR: 91 mL/min/{1.73_m2} (ref 60–150)
EGFR: 94 mL/min/{1.73_m2} (ref 60–150)
Glucose: 120 mg/dL — ABNORMAL HIGH (ref 71–99)
Glucose: 112 mg/dL — ABNORMAL HIGH (ref 71–99)
Osmolality Calculated: 271 mOsm/kg — ABNORMAL LOW (ref 275–300)
Osmolality Calculated: 270 mOsm/kg — ABNORMAL LOW (ref 275–300)
Potassium: 3.1 mMol/L — ABNORMAL LOW (ref 3.5–5.3)
Potassium: 3.4 mMol/L — ABNORMAL LOW (ref 3.5–5.3)
Sodium: 135 mMol/L — ABNORMAL LOW (ref 136–147)
Sodium: 135 mMol/L — ABNORMAL LOW (ref 136–147)

## 2018-08-07 LAB — CBC AND DIFFERENTIAL
Basophils %: 0.4 % (ref 0.0–3.0)
Basophils Absolute: 0 10*3/uL (ref 0.0–0.3)
Eosinophils %: 3.2 % (ref 0.0–7.0)
Eosinophils Absolute: 0.1 10*3/uL (ref 0.0–0.8)
Hematocrit: 34.7 % — ABNORMAL LOW (ref 36.0–48.0)
Hemoglobin: 11 gm/dL — ABNORMAL LOW (ref 12.0–16.0)
Lymphocytes Absolute: 1.2 10*3/uL (ref 0.6–5.1)
Lymphocytes: 36.9 % (ref 15.0–46.0)
MCH: 34 pg (ref 28–35)
MCHC: 32 gm/dL (ref 32–36)
MCV: 108 fL — ABNORMAL HIGH (ref 80–100)
MPV: 9.3 fL (ref 6.0–10.0)
Monocytes Absolute: 0.4 10*3/uL (ref 0.1–1.7)
Monocytes: 13 % (ref 3.0–15.0)
Neutrophils %: 46.5 % (ref 42.0–78.0)
Neutrophils Absolute: 1.5 10*3/uL — ABNORMAL LOW (ref 1.7–8.6)
PLT CT: 49 10*3/uL — ABNORMAL LOW (ref 130–440)
RBC: 3.22 10*6/uL — ABNORMAL LOW (ref 3.80–5.00)
RDW: 14.5 % — ABNORMAL HIGH (ref 11.0–14.0)
WBC: 3.2 10*3/uL — ABNORMAL LOW (ref 4.0–11.0)

## 2018-08-07 LAB — IRON PROFILE
% Saturation: 66 % — ABNORMAL HIGH (ref 15–50)
Iron: 249 ug/dL — ABNORMAL HIGH (ref 40.0–160.0)
TIBC: 379 ug/dL (ref 250–450)
Transferrin: 271 mg/dL (ref 180.0–382.0)

## 2018-08-07 LAB — VITAMIN B12 AND FOLATE
Folate: 7.2 ng/mL (ref 7.0–19.9)
Vitamin B-12: 424 pg/mL (ref 213–816)

## 2018-08-07 LAB — MAGNESIUM: Magnesium: 1.7 mg/dL (ref 1.6–2.6)

## 2018-08-07 MED ORDER — FUROSEMIDE 20 MG PO TABS
20.0000 mg | ORAL_TABLET | Freq: Every day | ORAL | 0 refills | Status: DC | PRN
Start: 2018-08-07 — End: 2018-10-22

## 2018-08-07 MED ORDER — POTASSIUM CHLORIDE 10 MEQ/100ML IV SOLN
10.0000 meq | INTRAVENOUS | Status: AC
Start: 2018-08-07 — End: 2018-08-07
  Administered 2018-08-07 (×2): 10 meq via INTRAVENOUS
  Filled 2018-08-07 (×2): qty 100

## 2018-08-07 MED ORDER — ONDANSETRON 4 MG PO TBDP
4.0000 mg | ORAL_TABLET | Freq: Three times a day (TID) | ORAL | 0 refills | Status: DC | PRN
Start: 2018-08-07 — End: 2018-11-19

## 2018-08-07 MED ORDER — TRAMADOL HCL 50 MG PO TABS
50.0000 mg | ORAL_TABLET | Freq: Four times a day (QID) | ORAL | 0 refills | Status: DC | PRN
Start: 2018-08-07 — End: 2018-08-07

## 2018-08-07 MED ORDER — AMOXICILLIN-POT CLAVULANATE 875-125 MG PO TABS
1.00 | ORAL_TABLET | Freq: Two times a day (BID) | ORAL | 0 refills | Status: AC
Start: 2018-08-07 — End: 2018-08-14

## 2018-08-07 MED ORDER — NALOXONE HCL 4 MG/0.1ML NA LIQD
NASAL | 0 refills | Status: DC
Start: 2018-08-07 — End: 2018-12-04

## 2018-08-07 MED ORDER — ONDANSETRON 4 MG PO TBDP
4.0000 mg | ORAL_TABLET | Freq: Three times a day (TID) | ORAL | 0 refills | Status: DC | PRN
Start: 2018-08-07 — End: 2018-08-07

## 2018-08-07 MED ORDER — AMLODIPINE BESYLATE 5 MG PO TABS
5.0000 mg | ORAL_TABLET | Freq: Every day | ORAL | 0 refills | Status: DC
Start: 2018-08-08 — End: 2018-10-22

## 2018-08-07 MED ORDER — TRAMADOL HCL 50 MG PO TABS
50.0000 mg | ORAL_TABLET | Freq: Four times a day (QID) | ORAL | 0 refills | Status: DC | PRN
Start: 2018-08-07 — End: 2018-10-22

## 2018-08-07 MED ORDER — SPIRONOLACTONE 50 MG PO TABS
50.0000 mg | ORAL_TABLET | Freq: Every day | ORAL | 0 refills | Status: DC
Start: 2018-08-07 — End: 2018-12-01

## 2018-08-07 MED ORDER — VH POTASSIUM CHLORIDE CRYS ER 20 MEQ PO TBCR (WRAP)
40.0000 meq | EXTENDED_RELEASE_TABLET | ORAL | Status: AC
Start: 2018-08-07 — End: 2018-08-07
  Administered 2018-08-07 (×2): 40 meq via ORAL
  Filled 2018-08-07 (×2): qty 2

## 2018-08-07 MED ORDER — SODIUM BICARBONATE 650 MG PO TABS
650.0000 mg | ORAL_TABLET | Freq: Three times a day (TID) | ORAL | 0 refills | Status: DC
Start: 2018-08-07 — End: 2018-10-17

## 2018-08-07 MED ORDER — NALOXONE HCL 4 MG/0.1ML NA LIQD
NASAL | 0 refills | Status: DC
Start: 2018-08-07 — End: 2018-08-07

## 2018-08-07 MED ORDER — VH POTASSIUM CHLORIDE CRYS ER 20 MEQ PO TBCR (WRAP)
40.00 meq | EXTENDED_RELEASE_TABLET | Freq: Once | ORAL | Status: AC
Start: 2018-08-07 — End: 2018-08-07
  Administered 2018-08-07: 15:00:00 40 meq via ORAL
  Filled 2018-08-07 (×2): qty 2

## 2018-08-07 NOTE — Plan of Care (Signed)
NURSE NOTE SUMMARY  [facility] - GI/ENDO/GEN MED   Patient Name: [name]   Attending Physician: Obie Dredge, *   Today's date:   08/07/2018 LOS: 0 days   Shift Summary:                                                              Assumed care of patient at 1900.  Patient resting in bed, iv infusing, no signs of distress, unlabored breathing, current CIWA score below 8. Dressing to right arm, clean, dry and intact.  Seizure precautions in place.    Bed alarm on    Provided nausea medication per patient request.    Provider Notifications:      Rapid Response Notifications:  Mobility:      PMP Activity: Step 6 - Walks in Room (08/06/2018  8:00 AM)     Weight tracking:  Family Dynamic:   Last 3 Weights for the past 72 hrs (Last 3 readings):   Weight   08/06/18 0040 69.1 kg (152 lb 4.8 oz)   08/05/18 1717 69 kg (152 lb 1.9 oz)             Recent Vitals Last Bowel Movement   BP: 142/86 (08/06/2018 10:48 PM)  Heart Rate: 79 (08/06/2018 10:48 PM)  Temp: 97.9 F (36.6 C) (08/06/2018 10:48 PM)  Resp Rate: 16 (08/06/2018 10:48 PM)  Height: 1.676 m (5\' 6" ) (08/06/2018 12:40 AM)  Weight: 69.1 kg (152 lb 4.8 oz) (08/06/2018 12:40 AM)  SpO2: 93 % (08/06/2018 10:48 PM)   Last BM Date: 08/06/18

## 2018-08-07 NOTE — Progress Notes (Signed)
Came to see patient at request of staff.     S: Patient was admitted with a BAL of .209. Patient is on CIWA currently. Patient reports drinking "3 to 5" drinks per day for the last month.     B: Patient reports she recently moved back here from [place], where she had been for a year getting treatment at [facility] for a dysfunctional pancreas. She reports she came in because of a dog bite that wasn't healing well. She denies drinking heavily "up until recently" but admits "maybe it's time to go back to AA." Patient reports multiple issues with loss; she admits to not having properly grieved for her son who died approximately 15 years ago, patient reports it was due to negligence at the jail he was in at the time.    A:   CNS assessment: A&Ox4. Able to Patient is mildly tremulous. Proprioception is adequate at this time. No tongue tremor noted. Patient is able to engage in linear conversation and demonstrates linear thinking. Mood is depressed, affect is superficially pleasant and blunted. Became tearful when discussing the passing of her 73 year old son many years ago.     When asked if patient feels that the drinking is a problem, she stated "its time to stop and go back to AA".     ANS assessment: pt denies chills, sweats at this time. Reports moderate "tolerable" level of anxiety. Denies feeling restless internally or externally. No flushing/erythema noted or other coloring variations indicating vasomotor instability.     R: reviewed with patient various services available, including behavioral health intensive outpatient program. Patient expressed interest, so recommended she talk with intake coordinator Marlowe Aschoff. Patient agreeable. Patient reports she was given a list of resources to help her with her drinking issue. Reviewed findings with RN, who is in agreement at this time.

## 2018-08-07 NOTE — Progress Notes (Addendum)
Readmission Risk  [facility] - GI/ENDO/GEN MED   Patient Name: [name]   Attending Physician: Obie Dredge, *   Today's date:   08/07/2018 LOS: 0 days   Expected Discharge Date Expected Discharge Date: 08/07/18    Readmission Assessment:                                                              Discharge Planning  Expected Discharge Date: 08/07/18  Does the patient have perscription coverage?: No  Utilize [facility]: Yes  Confirmed PCP name: Dr. Mel Almond  Last PCP Visit Date: 3+ years ago  Confirm Transport to F/U Appt.: Self/Private Vehicle/Friend  Social Work Referral: Yes, Consult Placed  Anticipated Home Health at [facility]: Doesn't qualify  Anticipated Placement at [facility]: Doesn't qualify  CM Comments: 08/07/18 RNCM (PH) OBS: ETOH ketoacidosis, electrolyte imbalance-K+ replace today. Discharge planned if electrolytes okay.  SEE NOTES.  PTA: lives w/friend, drives, indep ADL/amb.  BARRIERS: no PCP, uninsured, no funding source RX. NEEDS; Transition Clinic, Community fund for meds through [facility], [employer] consulted. PLAN; home w/friend support, [facility] transition clinic, indigent meds. states has transportation home.  Consult case management if other discharge needs arise.   Clinic Referrals: Transition      9:09 AM  Had extended conversation w/pt.  She moved to [place] from [facility] (treated at [facility]) approx 3 weeks ago to assist w/care of disabled veteran friend. She states was on long-term disability but has ended. Has applied for Medicare disability and was denied. During move, her extra meds were in moving [place] and did not get for 2 weeks and now has them.  States she has 'a lot of [place]' so she does not need it filled.  She has not seen Dr. Mel Almond in approx 3 years, agreeable to Transition Clinic and then into [facility].  [facility]ist to screen today and provided needs financial papework.      Spoke w/J. Janee Morn, SW, consulted for Substance abuse resources.    3:17 PM  Spoke  w/RN and then w/pt.  Pt states that she does not have $ to pay for tramadol (RX was sent to [facility] and is approx $16.00 and the Narcan is approx $165.  Spoke w/mgmt-Community funds will cover Narcan.  Spoke w/R. Rinaldo Ratel, RN and is aware.  He will be calling MD to confirm need for Narcan. Community fund-indigent meds for completed by Stark Bray and sent to [facility] for fill.     Pt states that she does not have ride home.  Will arrange taxi voucher for her.  Mikki Santee, SW will be assisting w/Taxi voucher.        IDPA:   Patient Type  Within 30 Days of Previous Admission?: No  Healthcare Decisions  Interviewed:: Patient  Orientation/Decision Making Abilities of Patient: Alert and Oriented x3, able to make decisions  Advance Directive: Patient does not have advance directive  Healthcare Agent Appointed: No  Prior to admission  Prior level of function: Independent with ADLs  Type of Residence: Private residence  Home Layout: Two level(3 steps to enter the home w/rails.  14 steps upstairs. )  Have running water, electricity, heat, etc?: Yes  Living Arrangements: Friends  How do you get to your MD appointments?: Self  How do  you get your groceries?: self  Who fixes your meals?: self  Who does your laundry?: self  Who picks up your prescriptions?: self  Dressing: Independent  Grooming: Independent  Feeding: Independent  Bathing: Independent  Toileting: Independent  DME Currently at Home: [employer]  Home Care/Community Services: None  Adult Protective Services (APS) involved?: No  Discharge Planning  Support Systems: Friends/neighbors  Patient expects to be discharged to:: Home w/friend support  Anticipated [facility] plan discussed with:: Same as interviewed  Potential barriers to discharge:: No insurance / under insured, No resource for medication, Other(substance use disorder)  Mode of transportation:: Private car (family member)  Does the patient have perscription coverage?: No  Consults/Providers  PT Evaluation  Needed: No  OT Evalulation Needed: No  SLP Evaluation Needed: No  Outcome Palliative Care Screen: Screened but did not meet criteria for intervention  Correct PCP listed in Epic?: Yes  PCP  PCP on file was verified as the current PCP?: Patient/family states they do not have a PCP   30 Day Readmission:       Provider Notifications:        Ranson Belluomini A. Leonor Liv, RN MSN  Case Management  Ph: [phone]  Fax: [phone]

## 2018-08-07 NOTE — Progress Notes (Signed)
Pt given Rx's from [facility] prior to discharge. Pt acknowledges understanding to pick up Tramadol from [facility] per [facility] instructions. Discharge

## 2018-08-07 NOTE — Discharge Instr - AVS First Page (Signed)
Return to emergency room on 07/15/2018 for suture removal and wound examination

## 2018-08-07 NOTE — Progress Notes (Signed)
RFA examined by Eustaquio Boyden, MD & re-dressed w/ clean non-adherent, 4x4, [place] prior to discharge. Pt states RFA injury d/t "dog bite" prior to admission. Pt in no acute distress. Will continue to monitor

## 2018-08-07 NOTE — Consults (Signed)
SW met with pt and provided pt with Substance Abuse Resource packet. Specifically pointed out and highlighted [facility], and discussed [facility] program and ph#s. Offered to give contact info to [facility]; pt said she would contact them. Also gave her online AA info.Provided Bedside Rn with indigent form. Virl Diamond, Medical Social Worker [phone]

## 2018-08-07 NOTE — Discharge Summary (Signed)
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 female patient that was admitted on 08/05/2018 for right-sided forearm from dog bite and found to have metabolic acidosis. Patient,  with chronic alcoholism, liver cirrhosis, hypertension, chronic pancreatitis , presented to ER after a dog bite which was bleeding. This was taken cared of with pressure dressing. She had tetanus shot a year ago.There is a roommate's dog which is therapy dog apparently the dog was trying to "protect her roommate";reportedly the dog is up-to-date on rabies vaccine .  Wound inspection shows  2 cm and 2 cm laceration which was single venous blood .  The wound is sutured in the ED and started antibiotic treatment.  The wound does not appear infected at this point.  She continues on Augmentin as outpatient.  Patient is to return for suture removal in the ED our surgical clinic.    There was significant lab derangement on admission. She was noted to have bicarb of 10.  ABG shows pH of 7.28, PCO2 24.  And has high anion gap.  The acute metabolic acidosis  primarily secondary to lactic acidosis and ketoacidosis related to alcoholism.  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  She stopped taking bicarb 2 weeks ago.  She was managed with the initial bicarb infusion and and then continue with IV fluids.  Her metabolic acidosis resolved with and advised to continue oral sodium bicarb.  Other electrolytes are might include hypokalemia and hypomagnesemia replaced.  Patient was also monitored on CIWA for alcohol withdrawal, and had minimal withdrawal symptoms.  Patient counseled on quitting of alcohol abuse.  Overall she has improved from admission and was discharged home            Discharge Instructions:          Disposition: Home  Diet: Regular Diet  Activity: As tolerated  Discharge Code Status: Full Code    [facility]  [address]  [place] [address]  [phone]  Follow up  May 6th at 10:00       Discharge Medications:  Discharge Medication List      Taking    albuterol 108 (90 Base) MCG/ACT inhaler  Dose:  2 puff  Commonly known as:  PROVENTIL HFA;VENTOLIN HFA  Inhale 2 puffs into the lungs every 6 (six) hours as needed for Wheezing     amLODIPine 5 MG tablet  Dose:  5 mg  Commonly known as:  NORVASC  Start taking on:  August 08, 2018  Take 1 tablet (5 mg total) by mouth daily     amoxicillin-clavulanate 875-125 MG per tablet  Dose:  1 tablet  Commonly known as:  AUGMENTIN  Take 1 tablet by mouth 2 (two) times daily for 7 days     furosemide 20 MG tablet  Dose:  20 mg  What changed:  reasons to take this  Commonly known as:  LASIX  For:  Edema  Take 1 tablet (20 mg total) by mouth daily as needed (for legs edema or abdominal swelling)     ibuprofen 200 MG tablet  Dose:  400 mg  Commonly known as:  ADVIL,MOTRIN  Take 400 mg by mouth every 6 (six) hours as needed for Pain.     multivitamin with minerals tablet  Dose:  1 tablet  Take 1 tablet  by mouth every morning.      ondansetron 4 MG disintegrating tablet  Dose:  4 mg  Commonly known as:  ZOFRAN-ODT  Take 1 tablet (4 mg total) by mouth every 8 (eight) hours as needed for Nausea     pancrelipase (Lip-Prot-Amyl) 24000-76000 units Cpep  Dose:  72,000 units of lipase  Commonly known as:  CREON 24000  Take 72,000 units of lipase by mouth 3 (three) times daily with meals     potassium chloride 20 MEQ tablet  Dose:  20 mEq  What changed:  when to take this  Commonly known as:  K-DUR,KLOR-CON  Take 1 tablet (20 mEq total) by mouth 2 (two) times daily.     sodium bicarbonate 650 MG tablet  Dose:  650 mg  Take 1 tablet (650 mg total) by mouth 3 (three) times daily     spironolactone 50 MG tablet  Dose:  50 mg  Commonly known as:  ALDACTONE  Take 1 tablet (50 mg total) by mouth daily     traMADol 50 MG tablet  Dose:  50 mg  Commonly known as:  ULTRAM  Take 1 tablet (50 mg total) by mouth every 6 (six) hours as needed for Pain     vitamin B-1 100 MG tablet  Dose:  100 mg  Commonly known as:  THIAMINE  Take 100 mg by mouth daily             Discharge Day Exam (08/07/2018):     Blood pressure 136/83, pulse 60, temperature 97 F (36.1 C), temperature source Oral, resp. rate 16, height 1.676 m (5\' 6" ), weight 69.1 kg (152 lb 4.8 oz), SpO2 92 %.      General: Patient is awake. In no acute distress.  Chest: CTA bilaterally. No rhonchi, no wheezing. No use of accessory muscles.  CVS: Normal rate and regular rhythm no murmurs, without JVD.  Abdomen: Soft, non-tender, no guarding or rigidity, with normal bowel sounds.  Extremities: No pitting edema, pulses palpable, no calf swelling and no gross deformity.  Skin: Warm, dry  NEURO: No motor or sensory deficits.     Recent Labs      Recent Labs   Lab 08/07/18  [id number] 08/06/18  6045 08/05/18  1753   WBC 3.2* 4.9 6.4   RBC 3.22* 3.19* 3.76*   Hemoglobin 11.0* 11.3* 12.9   Hematocrit 34.7* 33.7* 40.2   MCV 108* 106* 107*   PLT CT 49* 57* 72*     Recent Labs   Lab 08/05/18  1753    PT 12.4*   PT INR 1.2   aPTT 25.2         Lab Results   Component Value Date    HGBA1CPERCNT 5.3 04/19/2012     Recent Labs   Lab 08/07/18  1206 08/07/18  0446 08/06/18  0519 08/05/18  1826 08/05/18  1753   Glucose 112* 120* 105*  --  56*   Sodium 135* 135* 137  --  136   Potassium 3.4* 3.1* 3.6  --  4.3   Chloride 101 101 102  --  99   CO2 24 25 19*  --  10*   BUN 9 12 19   --  23*   Creatinine 0.71 0.73 1.08  --  1.35*   i-STAT Creatinine  --   --   --  1.70*  --    EGFR 94 91 57* 33* 44*   Calcium 8.5 8.6 8.4*  --  8.5     Recent Labs   Lab 08/07/18  0446 08/06/18  0519 08/05/18  1753   Magnesium 1.7 1.2* 1.5*   Albumin  --   --  4.5   Protein, Total  --   --  8.6*   Bilirubin, Total  --   --  1.7*   Alkaline Phosphatase  --   --  274*   ALT  --   --  63*   AST (SGOT)  --   --  168*        Allergies:      Morphine and Percocet [oxycodone-acetaminophen]   Time spent on discharging the patient:   minutes   Xr Forearm Right 2 Views    Result Date: 08/05/2018  No evidence of acute fracture, dislocation or retained radiopaque foreign body. ReadingStation:[id number]     Home Health Needs:  There are no questions and answers to display.      Obie Dredge, MD         08/07/18 1:49 PM   MRN: [id number]                                      CSN: [id number] DOB: Apr 01, 1961

## 2018-08-14 ENCOUNTER — Ambulatory Visit (INDEPENDENT_AMBULATORY_CARE_PROVIDER_SITE_OTHER): Payer: Self-pay | Admitting: Family

## 2018-08-15 ENCOUNTER — Ambulatory Visit (INDEPENDENT_AMBULATORY_CARE_PROVIDER_SITE_OTHER): Payer: Self-pay | Admitting: Family

## 2018-08-20 ENCOUNTER — Ambulatory Visit (INDEPENDENT_AMBULATORY_CARE_PROVIDER_SITE_OTHER): Payer: Self-pay | Admitting: Physician Assistant

## 2018-08-20 ENCOUNTER — Encounter (INDEPENDENT_AMBULATORY_CARE_PROVIDER_SITE_OTHER): Payer: Self-pay | Admitting: Physician Assistant

## 2018-08-20 VITALS — BP 120/75 | HR 93 | Temp 98.0°F | Resp 16 | Ht 66.0 in | Wt 150.0 lb

## 2018-08-20 DIAGNOSIS — T8133XA Disruption of traumatic injury wound repair, initial encounter: Secondary | ICD-10-CM

## 2018-08-20 DIAGNOSIS — Z789 Other specified health status: Secondary | ICD-10-CM

## 2018-08-20 NOTE — Patient Instructions (Signed)
Discharge Instructions: Caring for Your Wound   Taking proper care of your wound will help it heal. Your healthcare provider or nurse may show you specifically how to clean and dress the wound and how to tell if the wound is healing normally. This sheet will help you remember those guidelines when you are at home.   Getting ready   Put pets and children in another room, away from your work area.   Wash your hands before touching any of your supplies:  ? Turn on the water.  ? Wet your hands and wrists.  ? Use liquid soap from a pump dispenser. Work up a lather.  ? Scrub your hands thoroughly.  ? Rinse your hands with your fingers pointing toward the drain.  ? Dry your hands with a clean cloth or paper towel. Use this towel to turn off the faucet.  ? Remember, once you have washed your hands, don't touch anything other than your supplies. Wash your hands again if you touch anything, like furniture or your clothes.   Clean your work area:  ? Clean washable surfaces with soap and water, and dry with a clean cloth or paper towel.  ? Wipe surfaces that are not washable (like fabric or wood) so that they are free of dust.Spread a clean cloth or paper towel over your work surface.  ? Move away from the clean surface, if you need to cough or sneeze.   Gather your bandage supplies:  ? Gauze dressing or other bandage material  ? Medical tape  ? Disposable gloves   Wash your hands again.    Dressing the wound   Remove the old dressing:  ? Put on disposable gloves if you're dressing a wound for someone else or if your wound is infected.  ? Pull gently toward the wound to loosen the tape.  ? One layer at a time, gently remove the dressing.  ? If you have a drain or tube, be careful not to pull on it.  ? Look at the dressing. Make sure that you are seeing a decreasing amount of blood, and that the blood is turning into a clear, amber fluid.  ? If your wound has stitches, look for looseones.  ? Put the dressing in a  plastic bag. Then remove your gloves.   Inspect the wound. Look for signs that it isn't healing normally. A wound that isn't healing properly may be dark in color or have white streaks.   Dress the wound:  ? Wash your hands again.  ? Clean and dress the wound as you were shown by your healthcare provider or nurse.  ? Ifyou're dressing a wound for someone else or if your wound is infected, put on a new pair of disposable gloves.  ? If you have a drain or tube, be careful not to pull on it.   Discard any used materials or trash in a sealed plastic bag before placing in a trash can.    Follow-up  Make a follow-up appointment, or as directed by your healthcare provider.   When to call your healthcare provider  Call your healthcare provider right away if you have any of the following:    Shaking chills or fever above 100.4F ( 38C)   Bleeding that soaks the dressing   Stitches that are pulling away from the wound or pulling apart   Pink fluid weeping from the wound   Increased drainage from the wound or drainage that is yellow,   yellow-green, or smelly   Increased swelling, pain, or redness in the skin around the wound   A change in the color or sizeof the wound   Increased fatigue   Loss of appetite  [employer] last reviewed this educational content on 03/10/2018   2000-2020 [employer] [address], [place] [address]. All rights reserved. This information is not intended as a substitute for professional medical care. Always follow your healthcare professional's instructions.

## 2018-08-20 NOTE — Progress Notes (Signed)
Subjective:    Patient ID: [name] is a 58 y.o. female.    HPI  Bitten by dog 2 weeks ago  Given 4 sutures at ED  Was then hosp for 7 days for hyponatremia   Was too busy to get removal after discharge   No fever  Site has sensation and is 0 of 10 pain     The following portions of the patient's history were reviewed and updated as appropriate: allergies, current medications, past family history, past medical history, past social history, past surgical history and problem list.    Review of Systems   Constitutional: Negative for diaphoresis, fatigue and fever.   Respiratory: Negative for cough and wheezing.    Gastrointestinal: Negative for diarrhea, nausea and vomiting.   Musculoskeletal: Negative for arthralgias, joint swelling and myalgias.   Skin: Positive for color change (ecchymosis around bite area) and wound. Negative for rash.   Neurological: Negative for weakness and numbness.   All other systems reviewed and are negative.        Objective:    BP 120/75    Pulse 93    Temp 98 F (36.7 C) (Oral)    Resp 16    Ht 1.676 m (5\' 6" )    Wt 68 kg (150 lb)    BMI 24.21 kg/m     Physical Exam  Vitals signs reviewed.   Constitutional:       General: She is not in acute distress.     Appearance: She is well-developed. She is not diaphoretic.   HENT:      Head: Normocephalic and atraumatic.   Eyes:      General: No scleral icterus.        Right eye: No discharge.         Left eye: No discharge.      Conjunctiva/sclera: Conjunctivae normal.      Pupils: Pupils are equal, round, and reactive to light.   Neck:      Musculoskeletal: Normal range of motion and neck supple.   Cardiovascular:      Rate and Rhythm: Normal rate.   Pulmonary:      Effort: Pulmonary effort is normal. No respiratory distress.      Breath sounds: Normal breath sounds.   Abdominal:      Palpations: Abdomen is soft.   Musculoskeletal: Normal range of motion.        Arms:       Comments: Traumatic ecchymosis as drawn on forearm,  approximately 25x15cm  4 SI sutures loosley placed.  One lateral puncture site is well healing.   The three place in the "smiley face" C-shaped wound are: Superior in place, inferior two have dehisced laterally. This dehisence is causing a 1.25cm diameter and approx 1cm deep pseudoulceration.    Skin:     General: Skin is warm and dry.      Capillary Refill: Capillary refill takes less than 2 seconds.      Coloration: Skin is not pale.      Findings: No erythema or rash.   Neurological:      Mental Status: She is alert and oriented to person, place, and time.      Sensory: No sensory deficit.      Motor: No abnormal muscle tone.   Psychiatric:         Behavior: Behavior normal.           Assessment and Plan:  [name] was seen today for suture / staple removal.    Diagnoses and all orders for this visit:    Dehiscence of closure of traumatic laceration, initial encounter  -     Referral to Pain Clinic    History of dog bite  -     Referral to Pain Clinic    4 SI sutures removed today  Two distal dehisced  4 SI sutures confirmed on ED notes from late April     Nonstick bandage over the wound and coban applied today    Change bandage daily or if wet      Called and cortext pictures to [facility] MD, advised:  Follow up at wound clinic -- pt agrees (phone number and address provided)   Take out sutures today    Advised rest and fluids; discussed appropriate OTC sx tx for use PRN.   Follow up with PCP/ED or return to clinic if new, worsening, or non-resolving symptoms. Emergency Department immediately or call 911 if red-flag sx as discussed. ED or call 911 if dyspnea, syncope, chest pain, dizziness, other concern/adverse reaction.   Patient/guardian expressed understanding and agreement with plan of care at time of discharge.   BP reviewed. No indication for urgent management.   Reviewed AVS print out with patient. Advised patient to read entirety of AVS.    Patient Instructions     Discharge Instructions: Caring for  Your Wound   Taking proper care of your wound will help it heal. Your healthcare provider or nurse may show you specifically how to clean and dress the wound and how to tell if the wound is healing normally. This sheet will help you remember those guidelines when you are at home.   Getting ready   Put pets and children in another room, away from your work area.   Wash your hands before touching any of your supplies:  ? Turn on the water.  ? Wet your hands and wrists.  ? Use liquid soap from a pump dispenser. Work up a Education officer, museum.  ? Scrub your hands thoroughly.  ? Rinse your hands with your fingers pointing toward the drain.  ? Dry your hands with a clean cloth or paper towel. Use this towel to turn off the faucet.  ? Remember, once you have washed your hands, dont touch anything other than your supplies. Wash your hands again if you touch anything, like furniture or your clothes.   Clean your work area:  ? Clean washable surfaces with soap and water, and dry with a clean cloth or paper towel.  ? Wipe surfaces that are not washable (like fabric or wood) so that they are free of dust.Spread a clean cloth or paper towel over your work surface.  ? Move away from the clean surface, if you need to cough or sneeze.   Gather your bandage supplies:  ? Gauze dressing or other bandage material  ? Medical tape  ? Disposable gloves   Wash your hands again.    Dressing the wound   Remove the old dressing:  ? Put on disposable gloves if youre dressing a wound for someone else or if your wound is infected.  ? Pull gently toward the wound to loosen the tape.  ? One layer at a time, gently remove the dressing.  ? If you have a drain or tube, be careful not to pull on it.  ? Look at the dressing. Make sure that you are seeing a decreasing amount of  blood, and that the blood is turning into a clear, amber fluid.  ? If your wound has stitches, look for looseones.  ? Put the dressing in a plastic bag. Then remove your gloves.    Inspect the wound. Look for signs that it isn't healing normally. A wound that isnt healing properly may be dark in color or have white streaks.   Dress the wound:  ? Wash your hands again.  ? Clean and dress the wound as you were shown by your healthcare provider or nurse.  ? Ifyoure dressing a wound for someone else or if your wound is infected, put on a new pair of disposable gloves.  ? If you have a drain or tube, be careful not to pull on it.   Discard any used materials or trash in a sealed plastic bag before placing in a trash can.    Follow-up  Make a follow-up appointment, or as directed by your healthcare provider.   When to call your healthcare provider  Call your healthcare provider right away if you have any of the following:    Shaking chills or fever above 100.62F ( 38C)   Bleeding that soaks the dressing   Stitches that are pulling away from the wound or pulling apart   Pink fluid weeping from the wound   Increased drainage from the wound or drainage that is yellow, yellow-green, or smelly   Increased swelling, pain, or redness in the skin around the wound   A change in the color or sizeof the wound   Increased fatigue   Loss of appetite  StayWell last reviewed this educational content on 03/10/2018   2000-2020 [employer] [place]. [address] [place], [place] [address]. All rights reserved. This information is not intended as a substitute for professional medical care. Always follow your healthcare professional's instructions.              Nuala Alpha, PA  [facility]  08/20/2018  2:18 PM

## 2018-08-20 NOTE — Progress Notes (Signed)
4 sutures removed from right arm, patient tolerated well and had no questions or concerns. Carney Bern, PA notified.

## 2018-08-29 ENCOUNTER — Observation Stay
Admission: EM | Admit: 2018-08-29 | Discharge: 2018-08-31 | Disposition: A | Discharge status: Home Health | Payer: Medicaid Other | Attending: Surgery | Admitting: Surgery

## 2018-08-29 ENCOUNTER — Emergency Department: Payer: Medicaid Other

## 2018-08-29 ENCOUNTER — Encounter
Admission: EM | Disposition: A | Discharge status: Home Health | Payer: Self-pay | Source: Home / Self Care | Attending: Emergency Medicine

## 2018-08-29 ENCOUNTER — Encounter: Payer: Self-pay | Admitting: Registered Nurse

## 2018-08-29 DIAGNOSIS — W540XXA Bitten by dog, initial encounter: Secondary | ICD-10-CM

## 2018-08-29 DIAGNOSIS — Z885 Allergy status to narcotic agent status: Secondary | ICD-10-CM | POA: Insufficient documentation

## 2018-08-29 DIAGNOSIS — I10 Essential (primary) hypertension: Secondary | ICD-10-CM | POA: Insufficient documentation

## 2018-08-29 DIAGNOSIS — E871 Hypo-osmolality and hyponatremia: Secondary | ICD-10-CM | POA: Insufficient documentation

## 2018-08-29 DIAGNOSIS — Z79899 Other long term (current) drug therapy: Secondary | ICD-10-CM | POA: Insufficient documentation

## 2018-08-29 DIAGNOSIS — Z9049 Acquired absence of other specified parts of digestive tract: Secondary | ICD-10-CM | POA: Insufficient documentation

## 2018-08-29 DIAGNOSIS — Z7951 Long term (current) use of inhaled steroids: Secondary | ICD-10-CM | POA: Insufficient documentation

## 2018-08-29 DIAGNOSIS — J45909 Unspecified asthma, uncomplicated: Secondary | ICD-10-CM | POA: Insufficient documentation

## 2018-08-29 DIAGNOSIS — Z86718 Personal history of other venous thrombosis and embolism: Secondary | ICD-10-CM | POA: Insufficient documentation

## 2018-08-29 DIAGNOSIS — Z90411 Acquired partial absence of pancreas: Secondary | ICD-10-CM | POA: Insufficient documentation

## 2018-08-29 DIAGNOSIS — K860 Alcohol-induced chronic pancreatitis: Secondary | ICD-10-CM | POA: Diagnosis present

## 2018-08-29 DIAGNOSIS — Z791 Long term (current) use of non-steroidal anti-inflammatories (NSAID): Secondary | ICD-10-CM | POA: Insufficient documentation

## 2018-08-29 DIAGNOSIS — N179 Acute kidney failure, unspecified: Secondary | ICD-10-CM | POA: Insufficient documentation

## 2018-08-29 DIAGNOSIS — K219 Gastro-esophageal reflux disease without esophagitis: Secondary | ICD-10-CM | POA: Insufficient documentation

## 2018-08-29 DIAGNOSIS — F17210 Nicotine dependence, cigarettes, uncomplicated: Secondary | ICD-10-CM | POA: Insufficient documentation

## 2018-08-29 DIAGNOSIS — S51851A Open bite of right forearm, initial encounter: Principal | ICD-10-CM

## 2018-08-29 HISTORY — PX: DEBRIDEMENT & IRRIGATION, WOUND CLOSURE: SHX3698

## 2018-08-29 LAB — CBC AND DIFFERENTIAL
Basophils %: 1.3 % (ref 0.0–3.0)
Basophils Absolute: 0.1 10*3/uL (ref 0.0–0.3)
Eosinophils %: 2.1 % (ref 0.0–7.0)
Eosinophils Absolute: 0.2 10*3/uL (ref 0.0–0.8)
Hematocrit: 33.8 % — ABNORMAL LOW (ref 36.0–48.0)
Hemoglobin: 11.6 gm/dL — ABNORMAL LOW (ref 12.0–16.0)
Lymphocytes Absolute: 2.4 10*3/uL (ref 0.6–5.1)
Lymphocytes: 24.4 % (ref 15.0–46.0)
MCH: 35 pg (ref 28–35)
MCHC: 34 gm/dL (ref 32–36)
MCV: 102 fL — ABNORMAL HIGH (ref 80–100)
MPV: 6.2 fL (ref 6.0–10.0)
Monocytes Absolute: 1.1 10*3/uL (ref 0.1–1.7)
Monocytes: 11.7 % (ref 3.0–15.0)
Neutrophils %: 60.6 % (ref 42.0–78.0)
Neutrophils Absolute: 5.9 10*3/uL (ref 1.7–8.6)
PLT CT: 253 10*3/uL (ref 130–440)
RBC: 3.3 10*6/uL — ABNORMAL LOW (ref 3.80–5.00)
RDW: 13.4 % (ref 11.0–14.0)
WBC: 9.7 10*3/uL (ref 4.0–11.0)

## 2018-08-29 LAB — BASIC METABOLIC PANEL
Anion Gap: 20.2 mMol/L — ABNORMAL HIGH (ref 7.0–18.0)
BUN / Creatinine Ratio: 12.1 Ratio (ref 10.0–30.0)
BUN: 24 mg/dL — ABNORMAL HIGH (ref 7–22)
CO2: 15.8 mMol/L — ABNORMAL LOW (ref 20.0–30.0)
Calcium: 9.2 mg/dL (ref 8.5–10.5)
Chloride: 87 mMol/L — ABNORMAL LOW (ref 98–110)
Creatinine: 1.98 mg/dL — ABNORMAL HIGH (ref 0.60–1.20)
EGFR: 27 mL/min/{1.73_m2} — ABNORMAL LOW (ref 60–150)
Glucose: 94 mg/dL (ref 71–99)
Osmolality Calculated: 244 mOsm/kg — ABNORMAL LOW (ref 275–300)
Potassium: 4 mMol/L (ref 3.5–5.3)
Sodium: 119 mMol/L — CL (ref 136–147)

## 2018-08-29 SURGERY — DEBRIDEMENT & IRRIGATION, WOUND CLOSURE
Anesthesia: Anesthesia General | Laterality: Right | Wound class: Contaminated

## 2018-08-29 MED ORDER — KETOROLAC TROMETHAMINE 30 MG/ML IJ SOLN
INTRAMUSCULAR | Status: AC
Start: 2018-08-29 — End: ?
  Filled 2018-08-29: qty 1

## 2018-08-29 MED ORDER — SUCCINYLCHOLINE CHLORIDE 20 MG/ML IJ SOLN
INTRAMUSCULAR | Status: AC
Start: 2018-08-29 — End: ?
  Filled 2018-08-29: qty 10

## 2018-08-29 MED ORDER — MORPHINE SULFATE 4 MG/ML IJ/IV SOLN (WRAP)
Status: AC
Start: 2018-08-29 — End: ?
  Filled 2018-08-29: qty 1

## 2018-08-29 MED ORDER — AMPICILLIN-SULBACTAM SODIUM 3 (2-1) G IJ SOLR
3.00 g | Freq: Four times a day (QID) | INTRAMUSCULAR | Status: DC
Start: 2018-08-29 — End: 2018-08-30
  Administered 2018-08-29: 3 g via INTRAVENOUS
  Filled 2018-08-29 (×4): qty 20

## 2018-08-29 MED ORDER — FENTANYL CITRATE (PF) 50 MCG/ML IJ SOLN (WRAP)
INTRAMUSCULAR | Status: AC
Start: 2018-08-29 — End: ?
  Filled 2018-08-29: qty 2

## 2018-08-29 MED ORDER — ONDANSETRON HCL 4 MG/2ML IJ SOLN
INTRAMUSCULAR | Status: AC
Start: 2018-08-29 — End: ?
  Filled 2018-08-29: qty 4

## 2018-08-29 MED ORDER — ONDANSETRON HCL 4 MG/2ML IJ SOLN
INTRAMUSCULAR | Status: AC
Start: 2018-08-29 — End: ?
  Filled 2018-08-29: qty 2

## 2018-08-29 MED ORDER — MIDAZOLAM HCL 1 MG/ML IJ SOLN (WRAP)
INTRAMUSCULAR | Status: AC
Start: 2018-08-29 — End: ?
  Filled 2018-08-29: qty 2

## 2018-08-29 MED ORDER — ONDANSETRON HCL 4 MG/2ML IJ SOLN
4.00 mg | Freq: Once | INTRAMUSCULAR | Status: AC
Start: 2018-08-29 — End: 2018-08-29
  Administered 2018-08-29: 22:00:00 4 mg via INTRAVENOUS

## 2018-08-29 MED ORDER — FENTANYL CITRATE (PF) 50 MCG/ML IJ SOLN (WRAP)
INTRAMUSCULAR | Status: DC | PRN
Start: 2018-08-29 — End: 2018-08-30
  Administered 2018-08-29: 100 ug via INTRAVENOUS
  Administered 2018-08-30 (×2): 50 ug via INTRAVENOUS

## 2018-08-29 MED ORDER — VH BIO-K PLUS PROBIOTIC 50 BIL CFU CAPSULE
50.00 | DELAYED_RELEASE_CAPSULE | Freq: Every day | ORAL | Status: DC
Start: 2018-08-30 — End: 2018-08-31
  Administered 2018-08-30 – 2018-08-31 (×2): 50 via ORAL
  Filled 2018-08-29 (×2): qty 1

## 2018-08-29 MED ORDER — DEXAMETHASONE SODIUM PHOSPHATE 4 MG/ML IJ SOLN
INTRAMUSCULAR | Status: AC
Start: 2018-08-29 — End: ?
  Filled 2018-08-29: qty 1

## 2018-08-29 MED ORDER — PROPOFOL 200 MG/20ML IV EMUL
INTRAVENOUS | Status: AC
Start: 2018-08-29 — End: ?
  Filled 2018-08-29: qty 20

## 2018-08-29 MED ORDER — MORPHINE SULFATE 4 MG/ML IJ/IV SOLN (WRAP)
2.0000 mg | Freq: Once | Status: AC
Start: 2018-08-29 — End: 2018-08-29
  Administered 2018-08-29: 22:00:00 2 mg via INTRAVENOUS

## 2018-08-29 SURGICAL SUPPLY — 35 items
BANDAGE SOF BAND KERLIX 4.5 IN (Dressings) ×2 IMPLANT
CANNISTER SUCTION 3000C (Supply) ×2 IMPLANT
CHLORAPREP W/ORANGE TINT 26ML (Supply) IMPLANT
COVER LITE HANDLE DISP (Supply) ×4 IMPLANT
CULTURETTE BBL (Supply) IMPLANT
DRAPE UTILITY (Supply) ×2 IMPLANT
DRSG TELFA ISLAND 3 X 8 (Dressings) ×2 IMPLANT
DRSG XEROFORM 5 X 9 (Dressings) IMPLANT
ELECTRODE BLADE INSUL 2.75 (Supply) IMPLANT
GLOVE BIOGEL UND PI IND SZ 7.5 (Supply) ×2 IMPLANT
GLOVE BIOGEL UT M PI SURG SZ 7 (Supply) ×2 IMPLANT
GOWN AURORA NONREINF STER XLG (Supply) ×6 IMPLANT
GOWN IMPERV LG W/TOWEL REUS (Supply) IMPLANT
GOWN WARMING  STANDARD BAIRPAW (Supply) IMPLANT
NDL HYPO 25GA X 1.5 (Supply) IMPLANT
PACK DRAPE (Supply) IMPLANT
PACK DRAPE MAJOR (Supply) ×2 IMPLANT
PAD ABD STERILE 8 X 10 (Dressings) IMPLANT
PAD ARMBOARD 20 X 8 (Supply) ×2 IMPLANT
SCRUB BETADINE 4OZ (Supply) ×2 IMPLANT
SET STRYKER HANDPIECE NO TIP (Supply) ×2 IMPLANT
SOL SALINE IRRIG 500ML (Solutions) ×2 IMPLANT
SOL WATER STERILE IRRG 1000ML (Solutions) IMPLANT
SOLUTION POVIDONE IODINE 4 OZ (Supply) ×2 IMPLANT
SPONGE GAUZE 4 X 4 RAYTEC (Supply) IMPLANT
SPONGE GAUZE 4 X 4 STERILE (Dressings) ×2 IMPLANT
SPONGE LAP 18 X 18 (Supply) IMPLANT
STOCKINETTE IMPERVIOUS LGE (Supply) ×2 IMPLANT
SUT VICRYL 3-0 J416H (Supply) ×2 IMPLANT
SYRINGE 12CC CONTROL LL (Supply) IMPLANT
TAPE DURAPORE 3 X 10 (SILK) (Supply) ×2 IMPLANT
TIP STRYKER FAN SPRAY (Supply) ×2 IMPLANT
TOWEL BLUE STERILE 6 PER PK (Supply) ×4 IMPLANT
TRAY SKIN PREP  DRY (Supply) ×2 IMPLANT
TUBE YANKAUER (Supply) ×2 IMPLANT

## 2018-08-29 NOTE — Anesthesia Preprocedure Evaluation (Signed)
Anesthesia Evaluation    AIRWAY    Mallampati: II    TM distance: >3 FB  Neck ROM: full  Mouth Opening:full   CARDIOVASCULAR    cardiovascular exam normal, regular and normal       DENTAL           PULMONARY    pulmonary exam normal and clear to auscultation     OTHER FINDINGS                Relevant Problems   NEURO/PSYCH   (+) History of pancreatitis      CARDIO   (+) Essential hypertension               Anesthesia Plan    ASA 3     general               (Pt reports exercise tolerance > 4 METS or has had an appropriate preo-op cardiac evaluation performed. Pt denies the following cardiac symptoms: chest pain, chest pressure, shortness of breath & fatigue.    All pertinent medical records reviewed.    NPO guidelines met, unless otherwise specified or case is emergent.     Informed consent obtained and the discussed possibility of anesthesia complications including but not limited to: aspiration, medication reactions; need for intervention including use of pressors, line placement or invasive procedures.  Discussed possibility of dental damage, laryngospasm, cardiopulmonary issues, positional injuries and anesthesia options.  If MAC sedation is selected then increased possibility of recall was discussed as well as need for possible jaw thrust maneuver and residual sore jaw.   Pt voiced understanding and wished to proceed with procedure.       It should also be noted that due to the nature of the electronic medical record, some data may not be completely accurate due to artifact, inaccurate data entry by other staff, etc.   )      intravenous induction   Detailed anesthesia plan: general endotracheal      Post Op: plan for postoperative opioid usePlanned geographical disposition: PACU.    Post op pain management: per surgeon    informed consent obtained      pertinent labs reviewed             Signed by: [place] H Veronika Heard 08/29/18 10:16 PM

## 2018-08-29 NOTE — H&P (Signed)
TRAUMA HISTORY AND PHYSICAL - [facility]   Name:  [name], [name]                DOB:  July 30, 1960   MR#:  [id number]               Date:  08/29/18       ASSESSMENT:   58 y.o. female with the following active problems:  Active Hospital Problems    Diagnosis    Dog bite    Chronic alcoholic pancreatitis    History of partial pancreatectomy     The patient has a large open wound in the right forearm, dorsal aspect with a puncture wound on the volar aspect, this has exposed muscle, no obvious tendinous injury, no bony injuries on x-ray, I recommend proceeding to the operating room for a washout, possible partial closure, she understands that she might require multiple operations, might require further debridement in the future, understands the risk of infection especially in the setting of a dog bite, Unasyn has been ordered by the emergency department and will be continued after surgery     PLAN:   Patient will be admitted to: OR  Active Problems Plan   Active Hospital Problems    Diagnosis    Dog bite    Chronic alcoholic pancreatitis    History of partial pancreatectomy    Proceed to the operating room for washout, debridement, possible partial closure of right forearm wounds  Patient had received tetanus shot last month when she had another dog bite  Patient lives with a dog that bit her and has bit her before, this is a domestic dog and according to the patient does not have any chance of rabies     Arnoldo Hooker, MD  ACCESS [phone] or [phone]    Additional Info:  None    Consulting Services:   None    Alert Level:   NONE      CC:    Patient complains of dog bite to right forearm    HPI:   58 y.o. female  presents after she sustained a dog bite to the right forearm, the dog that bit her is her roommates dog, she has lived with his dog for a long time, this will be the fourth time she was bitten by this dog, she reports no chance of rabies infection and the dog, she reports that she approached the dog and  caught the dog by surprise and the dog went and bit her in the right forearm, she reports some pain at the site, her last bite involve the right hand and has healed.  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     PSH:   She  has a past surgical history that includes Colonoscopy (12/26/2012); COLONOSCOPY, POLYPECTOMY (12/26/2012); Cholecystectomy; Pancreatectomy (01/2011); and Thoracentesis (Right, 2012).     Social History:   She  reports that she has been smoking cigarettes. She has a 15.00 pack-year smoking history. She has never used smokeless tobacco. She reports current alcohol use. She reports that she does not use drugs.    Family History:   Family history was reviewed and found to  be noncontributory, the patient is adopted       Home Medications:     Current Facility-Administered Medications   Medication Dose Route Frequency Provider Last Rate Last Dose    ampicillin-sulbactam (UNASYN) 3 g in sodium chloride 0.9 % 100 mL IVPB  3 g Intravenous Q6H Smith, Rachael E, PA        [START ON 08/30/2018] lactobacillus species (BIO-K PLUS) capsule 50 Billion CFU  50 Billion CFU Oral Daily Shanda Howells E, PA         Current Outpatient Medications   Medication Sig Dispense Refill    albuterol (PROVENTIL HFA;VENTOLIN HFA) 108 (90 Base) MCG/ACT inhaler Inhale 2 puffs into the lungs every 6 (six) hours as needed for Wheezing      amLODIPine (NORVASC) 5 MG tablet Take 1 tablet (5 mg total) by mouth daily 30 tablet 0    furosemide (LASIX) 20 MG tablet Take 1 tablet (20 mg total) by mouth daily as needed (for legs edema or abdominal swelling) 30 tablet 0     ibuprofen (ADVIL,MOTRIN) 200 MG tablet Take 400 mg by mouth every 6 (six) hours as needed for Pain.          Multiple Vitamins-Minerals (MULTIVITAMIN WITH MINERALS) tablet Take 1 tablet by mouth every morning.         naloxone (NARCAN) 4 MG/0.1ML nasal spray 1 spray intranasally. If pt does not respond or relapses into respiratory depression call 911. Give additional doses every 2-3 min. 2 each 0    ondansetron (ZOFRAN-ODT) 4 MG disintegrating tablet Take 1 tablet (4 mg total) by mouth every 8 (eight) hours as needed for Nausea 20 tablet 0    pancrelipase, Lip-Prot-Amyl, (CREON 24000) 24000-76000 units Cap DR Particles Take 72,000 units of lipase by mouth 3 (three) times daily with meals      potassium chloride (K-DUR,KLOR-CON) 20 MEQ tablet Take 1 tablet (20 mEq total) by mouth 2 (two) times daily. (Patient taking differently: Take 20 mEq by mouth daily   ) 10 tablet 0    sodium bicarbonate 650 MG tablet Take 1 tablet (650 mg total) by mouth 3 (three) times daily 90 tablet 0    spironolactone (ALDACTONE) 50 MG tablet Take 1 tablet (50 mg total) by mouth daily 30 tablet 0    traMADol (ULTRAM) 50 MG tablet Take 1 tablet (50 mg total) by mouth every 6 (six) hours as needed for Pain 15 tablet 0    vitamin B-1 (THIAMINE) 100 MG tablet Take 100 mg by mouth daily         Allergies:   She is allergic to morphine and percocet [oxycodone-acetaminophen].     ROS(10):   Tetanus up to date: Yes  Constitutional:  Denies fever or chills   Eyes:  Denies change in visual acuity   HENT:  Denies nasal congestion or sore throat   Respiratory:  Denies cough or shortness of breath   Cardiovascular:  Denies chest pain or edema   GI:  Denies abdominal pain, nausea, vomiting, bloody stools or diarrhea   GU:  Denies dysuria   Musculoskeletal:  Denies back pain or joint pain   Integument:  Denies rash, large wound on right forearm  Neurologic:  Denies headache, focal weakness or sensory changes   Endocrine:  Denies polyuria or  polydipsia   Lymphatic:  Denies swollen glands   Psychiatric:  Denies depression or anxiety       Physical Exam(8):   Blood pressure 122/83, pulse Marland Kitchen)  106, temperature 97.3 F (36.3 C), temperature source Skin, resp. rate 20, height 1.676 m (5\' 6" ), weight 69.9 kg (154 lb 1.6 oz), SpO2 97 %.   General: Not in acute distress, alert and oriented x 3, non toxic  HEENT(2-eye/+): normocephalic, atraumatic, sclera clear and anicteric  Neck(0): supple, no lymphadenopathy, trachea midline  Chest: Clear to ausculation bilaterally, no deformities, no crepitus  Cardiac: Regular rate and rythm  Abdomen: Soft, non distended, non tender, healed midline laparotomy incision in multiple prior drain sites  GU/Pelvis: normal, pelvis stable  Extremities: 4 x 3 cm wound on the right forearm dorsal aspect with exposed muscle minimal residual fatty tissue, there is a triangular skin flap distally with marginal blood supply, on the volar aspect there is a puncture wound measuring about 1 x 1 cm, the muscle noted through the dorsal wound is moving appropriately, there does not appear to be a tendon injury  Neuro: alert, no gross focal neurologic deficits, moves all extremities well, no involuntary movements    Labs:      Results     Procedure Component Value Units Date/Time    CBC and differential [[id number]]  (Abnormal) Collected:  08/29/18 2129    Specimen:  Blood Updated:  08/29/18 2149     WBC 9.7 K/cmm      RBC 3.30 M/cmm      Hemoglobin 11.6 gm/dL      Hematocrit 40.9 %      MCV 102 fL      MCH 35 pg      MCHC 34 gm/dL      RDW 81.1 %      PLT CT 253 K/cmm      MPV 6.2 fL      NEUTROPHIL % 60.6 %      Lymphocytes 24.4 %      Monocytes 11.7 %      Eosinophils % 2.1 %      Basophils % 1.3 %      Neutrophils Absolute 5.9 K/cmm      Lymphocytes Absolute 2.4 K/cmm      Monocytes Absolute 1.1 K/cmm      Eosinophils Absolute 0.2 K/cmm      BASO Absolute 0.1 K/cmm     Basic Metabolic Panel [[id number]] Collected:  08/29/18 2129    Specimen:   Plasma Updated:  08/29/18 2138           Radiology:   Karie Georges Forearm Right 2 Views    Result Date: 08/29/2018  1.  No evidence of acute fracture. 2.  No evidence of retained radiopaque foreign body. ReadingStation:[id number]       The following images were received from an outside facility and reviewed:None     Massive transfusion protocol:  No    Total Critical Care time minus procedures and teaching is No Critical Care Time

## 2018-08-29 NOTE — ED Triage Notes (Addendum)
Patient is here today for a dog bite from her roommates service dog. Pt was here a month ago with a dog bite from the same dog. Dog bite- about 5-6inches long and about 2 inches wide.     Rabies is up to date and tetanus

## 2018-08-29 NOTE — ED Notes (Signed)
Placed an telfa and gauze with coban on top for pressure to site.

## 2018-08-29 NOTE — ED Notes (Addendum)
Pt has asked for something to drink, pt reminded that she is going to surgery and that at this time she cannot. She is changing into a gown. Removing earrings and placing them in a denture cup with her belongings ( jeans hoody, and wallet/ keys). Last drink was at 3pm. Pt just voided at 2200. Pt believes that she will be going home in the middle of the night/ post recovery. RN explained to patient that she would have to clarify with surgeon.

## 2018-08-29 NOTE — ED Provider Notes (Signed)
[facility]  EMERGENCY DEPARTMENT  History and Physical Exam       Patient Name: [name], [name]  Encounter Date:  08/29/2018  Physician Assistant: Shanda Howells, PA-C  Attending Physician: Dunn, [place] B, MD  PCP: Marisa Sprinkles, MD  Patient DOB:  [birth year]  MRN:  [id number]  Room:  E59/E59-A    History of Presenting Illness     Chief complaint: Dog bite    HPI/ROS given by: Patient    [name] is a 58 y.o. female who presents with dog bite to right forearm that occurred just prior to arrival.  Patient reports she was attempting to move the animal when it bit her on the arm.  Dog is up-to-date on all vaccinations.  The patient is likewise up-to-date on vaccinations, including tetanus updated within the past 1 year.  This is the fourth occurrence that the dog is bit her.  The patient reports that animal control was called to observe and likely euthanize the animal.    She denies any further injury or trauma.  No recent illness.  No fever, chills, cough or congestion.  No recent travel.  No increased risk for SARS-CoV-2.      Review of Systems     Review of Systems   Constitutional: Negative for chills and fever.   HENT: Negative for congestion, ear pain and sore throat.    Eyes: Negative for redness and visual disturbance.   Respiratory: Negative for cough and shortness of breath.    Cardiovascular: Negative for chest pain and palpitations.   Gastrointestinal: Negative for abdominal pain, diarrhea, nausea and vomiting.   Genitourinary: Negative for difficulty urinating and dysuria.   Musculoskeletal: Negative for back pain.   Skin: Positive for wound. Negative for rash.   Neurological: Negative for weakness, numbness and headaches.   Hematological: Does not bruise/bleed easily.   Psychiatric/Behavioral: Negative.    All other systems reviewed and are negative.       Allergies & Medications     Pt is allergic to morphine and percocet [oxycodone-acetaminophen].    Current/Home Medications     ALBUTEROL (PROVENTIL HFA;VENTOLIN HFA) 108 (90 BASE) MCG/ACT INHALER    Inhale 2 puffs into the lungs every 6 (six) hours as needed for Wheezing    AMLODIPINE (NORVASC) 5 MG TABLET    Take 1 tablet (5 mg total) by mouth daily    FUROSEMIDE (LASIX) 20 MG TABLET    Take 1 tablet (20 mg total) by mouth daily as needed (for legs edema or abdominal swelling)    IBUPROFEN (ADVIL,MOTRIN) 200 MG TABLET    Take 400 mg by mouth every 6 (six) hours as needed for Pain.        MULTIPLE VITAMINS-MINERALS (MULTIVITAMIN WITH MINERALS) TABLET    Take 1 tablet by mouth every morning.       NALOXONE (NARCAN) 4 MG/0.1ML NASAL SPRAY    1 spray intranasally. If pt does not respond or relapses into respiratory depression call 911. Give additional doses every 2-3 min.    ONDANSETRON (ZOFRAN-ODT) 4 MG DISINTEGRATING TABLET    Take 1 tablet (4 mg total) by mouth every 8 (eight) hours as needed for Nausea    PANCRELIPASE, LIP-PROT-AMYL, (CREON 24000) 24000-76000 UNITS CAP DR PARTICLES    Take 72,000 units of lipase by mouth 3 (three) times daily with meals    POTASSIUM CHLORIDE (K-DUR,KLOR-CON) 20 MEQ TABLET    Take 1 tablet (20 mEq total) by mouth 2 (two) times daily.  SODIUM BICARBONATE 650 MG TABLET    Take 1 tablet (650 mg total) by mouth 3 (three) times daily    SPIRONOLACTONE (ALDACTONE) 50 MG TABLET    Take 1 tablet (50 mg total) by mouth daily    TRAMADOL (ULTRAM) 50 MG TABLET    Take 1 tablet (50 mg total) by mouth every 6 (six) hours as needed for Pain    VITAMIN B-1 (THIAMINE) 100 MG TABLET    Take 100 mg by mouth daily        Past Medical History     Pt has a past medical history of Ascites, Asthma without status asthmaticus, Clostridium difficile colitis, Disorder of liver, DVT (deep venous thrombosis), Gastroesophageal reflux disease, H/O ETOH abuse, Hypertension, Hypokalemia, Pancreatitis, and S/P IVC filter.     Past Surgical History     Pt  has a past surgical history that includes Colonoscopy (12/26/2012); COLONOSCOPY,  POLYPECTOMY (12/26/2012); Cholecystectomy; Pancreatectomy (01/2011); and Thoracentesis (Right, 2012).     Family History     The family history is not on file. She was adopted.     Social History     Pt reports that she has been smoking cigarettes. She has a 15.00 pack-year smoking history. She has never used smokeless tobacco. She reports current alcohol use. She reports that she does not use drugs.     Physical Exam     Blood pressure 122/83, pulse (!) 106, temperature 97.3 F (36.3 C), temperature source Skin, resp. rate 20, height 1.676 m, weight 69.9 kg, SpO2 97 %.    Physical Exam   Constitutional: Vital signs are normal. She appears well-developed and well-nourished. No distress.   HENT:   Head: Normocephalic and atraumatic.   Cardiovascular: Normal rate and regular rhythm.   Pulmonary/Chest: Breath sounds normal.   Abdominal: Soft. There is no abdominal tenderness.   Neurological: She is alert. She is not disoriented.   Skin: Laceration noted.   Large wound to the dorsum of the right forearm with exposure of extensor muscle and tendon.  Flexion and extension intact.  Thumbs up, rock, paper, scissors, okay sign intact.  Smaller wound to the wrist.  No active bleeding.            Diagnostic Results     The results of the diagnostic studies below have been reviewed by myself:    Labs  Results     ** No results found for the last 24 hours. **          Radiologic Studies  Xr Forearm Right 2 Views    Result Date: 08/29/2018  1.  No evidence of acute fracture. 2.  No evidence of retained radiopaque foreign body. ReadingStation:[id number]      EKG: none     ED Meds     ED Medication Orders (From admission, onward)    None           ED Course and Medical Decision Making     Old medical records and nursing/triage notes were reviewed by myself.    DIAGNOSTIC CONSIDERATIONS    Differential diagnoses include but not limited to laceration, fracture, foreign body, neurovascular injury.    CONSULTS  1.  Case and plan of care  discussed with ED attending, Dr. Shea Evans.  He also directly examined this patient.  2.  Case discussed with trauma surgery, Dr. Emilio Math.  Recommended orthopedic consult.  If no indication for orthopedic intervention, he will take the patient for OR washout.  3.  Case discussed with orthopedics, Dr. Jarold Motto who recommends trauma surgery management.    ED COURSE & MDM    9:30 PM Patient presents with dog bite to the right forearm as pictured above.  Unfortunately, the wound is very large.  There is minimal subcutaneous fat.  There is exposure of extensor musculature and tendon.  Given the nature of the injury and the severity, high likelihood for infection.  Case discussed with trauma surgery, Dr. Emilio Math.  He will take the patient to the OR for full washout.  Case was also discussed with orthopedics, Dr. Jarold Motto as well as my attending, Dr. Shea Evans.  Empiric antibiotic coverage with IV Unasyn.  Admit.    In addition to the above history, please see nursing notes. Allergies, meds, past medical, family, social hx, and the results of the diagnostic studies performed have been reviewed by myself.    This chart was generated by an EMR and may contain errors or omissions not intended by the user.     Procedures / Critical Care     None     Diagnosis / Disposition     Clinical Impression  1. Dog bite of right forearm, initial encounter        Disposition  ED Disposition     ED Disposition Condition Date/Time Comment    Admit  Thu Aug 29, 2018  9:28 PM           Follow up for Discharged Patients  No follow-up provider specified.    Prescriptions for Discharged Patients  New Prescriptions    No medications on file             Ruffin Pyo, [place]  08/29/18 2300       Dunn, [place] B, MD  08/30/18 [phone]

## 2018-08-29 NOTE — ED Notes (Signed)
Pt asking for pain medications and something to drink. RN to patient that we would have to wait to make sure that she didn't need a procedure before allowing her to drink.

## 2018-08-30 ENCOUNTER — Emergency Department: Payer: Medicaid Other | Admitting: Anesthesiology

## 2018-08-30 DIAGNOSIS — J45909 Unspecified asthma, uncomplicated: Secondary | ICD-10-CM | POA: Diagnosis present

## 2018-08-30 DIAGNOSIS — N179 Acute kidney failure, unspecified: Secondary | ICD-10-CM | POA: Diagnosis present

## 2018-08-30 DIAGNOSIS — K219 Gastro-esophageal reflux disease without esophagitis: Secondary | ICD-10-CM | POA: Insufficient documentation

## 2018-08-30 DIAGNOSIS — I10 Essential (primary) hypertension: Secondary | ICD-10-CM | POA: Diagnosis present

## 2018-08-30 LAB — BASIC METABOLIC PANEL
Anion Gap: 14.2 mMol/L (ref 7.0–18.0)
BUN / Creatinine Ratio: 20 Ratio (ref 10.0–30.0)
BUN: 28 mg/dL — ABNORMAL HIGH (ref 7–22)
CO2: 20 mMol/L (ref 20–30)
Calcium: 8.8 mg/dL (ref 8.5–10.5)
Chloride: 93 mMol/L — ABNORMAL LOW (ref 98–110)
Creatinine: 1.4 mg/dL — ABNORMAL HIGH (ref 0.60–1.20)
EGFR: 41 mL/min/{1.73_m2} — ABNORMAL LOW (ref 60–150)
Glucose: 161 mg/dL — ABNORMAL HIGH (ref 71–99)
Osmolality Calculated: 257 mOsm/kg — ABNORMAL LOW (ref 275–300)
Potassium: 4.2 mMol/L (ref 3.5–5.3)
Sodium: 123 mMol/L — CL (ref 136–147)

## 2018-08-30 MED ORDER — LACTATED RINGERS IV SOLN
100.00 mL/h | INTRAVENOUS | Status: DC
Start: 2018-08-30 — End: 2018-08-30
  Administered 2018-08-30: 02:00:00 100 mL/h via INTRAVENOUS

## 2018-08-30 MED ORDER — PROPOFOL 200 MG/20ML IV EMUL
INTRAVENOUS | Status: DC | PRN
Start: 2018-08-30 — End: 2018-08-30
  Administered 2018-08-30: 200 mg via INTRAVENOUS

## 2018-08-30 MED ORDER — HYDRALAZINE HCL 20 MG/ML IJ SOLN
10.0000 mg | Freq: Once | INTRAMUSCULAR | Status: DC
Start: 2018-08-30 — End: 2018-08-30

## 2018-08-30 MED ORDER — SODIUM CHLORIDE 0.9 % IV SOLN
INTRAVENOUS | Status: DC
Start: 2018-08-30 — End: 2018-08-31

## 2018-08-30 MED ORDER — FENTANYL CITRATE (PF) 50 MCG/ML IJ SOLN (WRAP)
25.0000 ug | INTRAMUSCULAR | Status: AC | PRN
Start: 2018-08-30 — End: 2018-08-30
  Administered 2018-08-30 (×4): 25 ug via INTRAVENOUS
  Filled 2018-08-30: qty 2

## 2018-08-30 MED ORDER — HYDROCODONE-ACETAMINOPHEN 5-325 MG PO TABS
1.0000 | ORAL_TABLET | ORAL | Status: DC | PRN
Start: 2018-08-30 — End: 2018-08-30
  Administered 2018-08-30: 1 via ORAL
  Filled 2018-08-30: qty 1

## 2018-08-30 MED ORDER — SUCCINYLCHOLINE CHLORIDE 20 MG/ML IJ SOLN
INTRAMUSCULAR | Status: DC | PRN
Start: 2018-08-30 — End: 2018-08-30
  Administered 2018-08-30: 140 mg via INTRAVENOUS

## 2018-08-30 MED ORDER — VH HEPARIN SODIUM (PORCINE) 5000 UNIT/ML IJ SOLN
5000.00 [IU] | Freq: Three times a day (TID) | INTRAMUSCULAR | Status: DC
Start: 2018-08-30 — End: 2018-08-31
  Filled 2018-08-30: qty 1

## 2018-08-30 MED ORDER — DOCUSATE SODIUM 100 MG PO CAPS
100.00 mg | ORAL_CAPSULE | Freq: Two times a day (BID) | ORAL | Status: DC
Start: 2018-08-30 — End: 2018-08-31
  Administered 2018-08-30 – 2018-08-31 (×3): 100 mg via ORAL
  Filled 2018-08-30 (×4): qty 1

## 2018-08-30 MED ORDER — PANCRELIPASE (LIP-PROT-AMYL) 24000-76000 UNITS PO CPEP
72000.00 [IU] | ORAL_CAPSULE | Freq: Three times a day (TID) | ORAL | Status: DC
Start: 2018-08-30 — End: 2018-08-31
  Administered 2018-08-30 – 2018-08-31 (×4): 72000 [IU] via ORAL
  Filled 2018-08-30 (×6): qty 3

## 2018-08-30 MED ORDER — SODIUM BICARBONATE 650 MG PO TABS
650.0000 mg | ORAL_TABLET | Freq: Three times a day (TID) | ORAL | Status: DC
Start: 2018-08-30 — End: 2018-08-31
  Administered 2018-08-30 – 2018-08-31 (×4): 650 mg via ORAL
  Filled 2018-08-30 (×7): qty 1

## 2018-08-30 MED ORDER — LORAZEPAM 2 MG/ML IJ SOLN
0.5000 mg | INTRAMUSCULAR | Status: DC | PRN
Start: 2018-08-30 — End: 2018-08-30
  Filled 2018-08-30: qty 0.25

## 2018-08-30 MED ORDER — DIPHENHYDRAMINE HCL 50 MG/ML IJ SOLN
25.0000 mg | Freq: Four times a day (QID) | INTRAMUSCULAR | Status: DC | PRN
Start: 2018-08-30 — End: 2018-08-30

## 2018-08-30 MED ORDER — HYDROCODONE-ACETAMINOPHEN 5-325 MG PO TABS
2.0000 | ORAL_TABLET | ORAL | Status: DC | PRN
Start: 2018-08-30 — End: 2018-08-30
  Administered 2018-08-30: 2 via ORAL
  Filled 2018-08-30: qty 2

## 2018-08-30 MED ORDER — HALOPERIDOL LACTATE 5 MG/ML IJ SOLN
1.0000 mg | Freq: Once | INTRAMUSCULAR | Status: DC | PRN
Start: 2018-08-30 — End: 2018-08-30

## 2018-08-30 MED ORDER — AMPICILLIN-SULBACTAM SODIUM 3 (2-1) G IJ SOLR
3.00 g | Freq: Four times a day (QID) | INTRAMUSCULAR | Status: DC
Start: 2018-08-30 — End: 2018-08-31
  Administered 2018-08-30 – 2018-08-31 (×5): 3 g via INTRAVENOUS
  Filled 2018-08-30 (×9): qty 20

## 2018-08-30 MED ORDER — ONDANSETRON 4 MG PO TBDP
4.00 mg | ORAL_TABLET | Freq: Three times a day (TID) | ORAL | Status: DC | PRN
Start: 2018-08-30 — End: 2018-08-31

## 2018-08-30 MED ORDER — FENTANYL CITRATE (PF) 50 MCG/ML IJ SOLN (WRAP)
INTRAMUSCULAR | Status: AC
Start: 2018-08-30 — End: ?
  Filled 2018-08-30: qty 2

## 2018-08-30 MED ORDER — AMLODIPINE BESYLATE 5 MG PO TABS
5.0000 mg | ORAL_TABLET | Freq: Every day | ORAL | Status: DC
Start: 2018-08-30 — End: 2018-08-31
  Administered 2018-08-30 – 2018-08-31 (×2): 5 mg via ORAL
  Filled 2018-08-30 (×2): qty 1

## 2018-08-30 MED ORDER — VH HYDROMORPHONE HCL PF 1 MG/ML CARPUJECT
0.5000 mg | INTRAMUSCULAR | Status: DC | PRN
Start: 2018-08-30 — End: 2018-08-30

## 2018-08-30 MED ORDER — SODIUM CHLORIDE 0.9 % IR SOLN
Status: DC | PRN
Start: 2018-08-30 — End: 2018-08-30
  Administered 2018-08-30: 01:00:00 500 mL

## 2018-08-30 MED ORDER — MIDAZOLAM HCL 1 MG/ML IJ SOLN (WRAP)
INTRAMUSCULAR | Status: DC | PRN
Start: 2018-08-30 — End: 2018-08-30
  Administered 2018-08-30: 2 mg via INTRAVENOUS

## 2018-08-30 MED ORDER — VALLEY PROMETHAZINE 50 MG/0.4 ML TOPICAL GEL UD (RPKG)
25.0000 mg | Freq: Once | TOPICAL | Status: DC | PRN
Start: 2018-08-30 — End: 2018-08-30

## 2018-08-30 MED ORDER — LACTATED RINGERS IV SOLN
50.0000 mL/h | INTRAVENOUS | Status: DC
Start: 2018-08-30 — End: 2018-08-30

## 2018-08-30 MED ORDER — LACTATED RINGERS IV SOLN
INTRAVENOUS | Status: DC
Start: 2018-08-30 — End: 2018-08-30

## 2018-08-30 MED ORDER — PROCHLORPERAZINE EDISYLATE 10 MG/2ML IJ SOLN
5.0000 mg | Freq: Once | INTRAMUSCULAR | Status: DC | PRN
Start: 2018-08-30 — End: 2018-08-30

## 2018-08-30 MED ORDER — ONDANSETRON HCL 4 MG/2ML IJ SOLN
4.0000 mg | Freq: Once | INTRAMUSCULAR | Status: DC | PRN
Start: 2018-08-30 — End: 2018-08-30

## 2018-08-30 MED ORDER — SODIUM CHLORIDE 0.9 % IR SOLN
Status: DC | PRN
Start: 2018-08-30 — End: 2018-08-30
  Administered 2018-08-30: 01:00:00 3000 mL

## 2018-08-30 MED ORDER — CEROVITE ADVANCED FORMULA PO TABS
1.0000 | ORAL_TABLET | Freq: Every morning | ORAL | Status: DC
Start: 2018-08-30 — End: 2018-08-31
  Administered 2018-08-30 – 2018-08-31 (×2): 1 via ORAL
  Filled 2018-08-30 (×2): qty 1

## 2018-08-30 MED ORDER — FUROSEMIDE 20 MG PO TABS
20.0000 mg | ORAL_TABLET | Freq: Every day | ORAL | Status: DC | PRN
Start: 2018-08-30 — End: 2018-08-31

## 2018-08-30 MED ORDER — HYDROMORPHONE HCL 0.5 MG/0.5 ML IJ SOLN
0.50 mg | INTRAMUSCULAR | Status: DC | PRN
Start: 2018-08-30 — End: 2018-08-31

## 2018-08-30 MED ORDER — ALBUTEROL SULFATE (2.5 MG/3ML) 0.083% IN NEBU
2.50 mg | INHALATION_SOLUTION | Freq: Four times a day (QID) | RESPIRATORY_TRACT | Status: DC | PRN
Start: 2018-08-30 — End: 2018-08-31

## 2018-08-30 MED ORDER — METOPROLOL TARTRATE 5 MG/5ML IV SOLN
1.0000 mg | INTRAVENOUS | Status: DC | PRN
Start: 2018-08-30 — End: 2018-08-30

## 2018-08-30 MED ORDER — OXYCODONE-ACETAMINOPHEN 5-325 MG PO TABS
1.0000 | ORAL_TABLET | ORAL | Status: DC | PRN
Start: 2018-08-30 — End: 2018-08-31
  Administered 2018-08-30: 1 via ORAL
  Filled 2018-08-30: qty 1

## 2018-08-30 MED ORDER — ONDANSETRON HCL 4 MG/2ML IJ SOLN
INTRAMUSCULAR | Status: DC | PRN
Start: 2018-08-30 — End: 2018-08-30
  Administered 2018-08-30: 8 mg via INTRAVENOUS

## 2018-08-30 MED ORDER — VH PHENYLEPHRINE 120 MCG/ML IV BOLUS (ANESTHESIA)
PREFILLED_SYRINGE | INTRAVENOUS | Status: DC | PRN
Start: 2018-08-30 — End: 2018-08-30
  Administered 2018-08-30 (×2): 120 ug via INTRAVENOUS

## 2018-08-30 MED ORDER — OXYCODONE HCL 5 MG PO TABS
5.0000 mg | ORAL_TABLET | Freq: Once | ORAL | Status: DC | PRN
Start: 2018-08-30 — End: 2018-08-30
  Filled 2018-08-30: qty 1

## 2018-08-30 MED ORDER — VH VITAMIN B-1 100 MG PO TABS (WRAP)
100.0000 mg | ORAL_TABLET | Freq: Two times a day (BID) | ORAL | Status: DC
Start: 2018-08-30 — End: 2018-08-31
  Administered 2018-08-30 – 2018-08-31 (×3): 100 mg via ORAL
  Filled 2018-08-30 (×5): qty 1

## 2018-08-30 MED ORDER — VH PHENYLEPHRINE 30 MG IN NS 250 ML INFUSION (SIMPLE)
0.0000 ug/min | INTRAVENOUS | Status: DC
Start: 2018-08-30 — End: 2018-08-30

## 2018-08-30 MED ORDER — ACETAMINOPHEN 500 MG PO TABS
1000.0000 mg | ORAL_TABLET | Freq: Four times a day (QID) | ORAL | Status: DC
Start: 2018-08-30 — End: 2018-08-30

## 2018-08-30 MED ORDER — MEPERIDINE HCL 25 MG/ML IJ SOLN
12.5000 mg | Freq: Once | INTRAMUSCULAR | Status: DC | PRN
Start: 2018-08-30 — End: 2018-08-30
  Filled 2018-08-30: qty 0.5

## 2018-08-30 NOTE — Nursing Progress Note (Addendum)
Patient admitted to unit, admission done. Patient reporting that she has taken all meds from PTA home yesterday but also reporting she hasn't taken the meds because she couldn't pick the up r/t insurance. Charted as having taken per patient's report. Patient reporting that she has quit drinking.    Patient refusing SCDs and heparin injections despite re-education by nurse especially with patient's history of DVT. Patient verbalizes understanding and still refusing.    Patient wrote note regarding home meds:  Creon when she eats, thiamine q12 hours, potassium "a millequivalent" three times a day

## 2018-08-30 NOTE — PACU (Signed)
DISCUSSED PT.S PRE-OP LAB RESULTS OF NA. 119, BUN 24, CREAT. 1.98, EGFR 27 WITH DR. TALLEY, HE DID NOT WANT NS IV, DID NOT WANT TO RAISE NA TOO FAST. STATES PT. HAD NO CONFUSION PRE-OP DID HAVE SLIGHT PROBLEM FORMING WORDS RIGHT  BEFORE GOING BACK TO OR.

## 2018-08-30 NOTE — Op Note (Signed)
FULL OPERATIVE NOTE    Date Time: 08/30/18 12:57 AM  Patient Name: [name]  Attending Physician: No att. providers found      Date of Operation:   08/29/2018    Providers Performing:   Surgeon(s):  Arnoldo Hooker, MD    Anesthesiologist: Talley, [place] H, DO    Circulator: Darrold Junker, RN  Scrub Person: Greig Castilla    Anesthesia/Sedation:   general    Operative Procedure:   Procedure(s):  DEBRIDEMENT & IRRIGATION, WOUND CLOSURE - Wound Class: Contaminated     Preoperative Diagnosis:   Pre-Op Diagnosis Codes:     * Dog bite, initial encounter [N82.0XXA]    Postoperative Diagnosis:   Post-Op Diagnosis Codes:     * Dog bite, initial encounter [N56.0XXA]    Wound Class:   Contaminated     Indications:   58 year old lady who sustained a dog bite to the right forearm, found to have 2 wounds on the right forearm the dorsal wound with exposed muscle, measured about 4 x 3 cm    Findings:   Dorsal wound with exposed muscle and loss of fascial covering on the right forearm, smaller volar wound, both irrigated, the dorsal wound was partially closed with overlying skin    Operative Notes:   Patient was taken to the operating room, general esthesia was initiated, right forearm was prepped and draped in usual sterile manner, timeout was performed all surgical team was in agreement    I proceeded with exploration of both wounds, no foreign material was noted, I proceeded with debridement of necrotic skin and subcutaneous tissue, the debrided tissue was minimal, I then proceeded with copious irrigation with 3 L of saline using pulse lavage, there was a triangular flap of skin going distally and this was sutured back to achieve partial coverage of the open wound, this was approximated with 3-0 nylon interrupted sutures, some of the deeper tissue was approximated with 3-0 Vicryl interrupted simple sutures, the wound on the volar aspect was left open, the wound was dressed with Telfa 4 x 4 and  Kerlix    Estimated Blood Loss:    10 mL    Implants:   * No implants in log *        Specimens:   None   * No specimens in log *      Complications:   * No complications entered in OR log *      Signed by: Arnoldo Hooker, MD

## 2018-08-30 NOTE — Progress Notes (Signed)
Quick Doc  [facility] - 4TH SURGICAL   Patient Name: [name]   Attending Physician: Leory Plowman, MD   Today's date:   08/30/2018 LOS: 0 days   Expected Discharge Date      Quick  Assessment:                                                                   CM Comments: (P) 5/22 RNCM DP: Adm s/p dog bite. Amb. IS. Reg. Wound care. Indep/Drive. [facility] arranged, F2F sent. Indigent visits approved x2. Denies other needs. D/C home today w/roommate supp/trans.    Physical Discharge Disposition: Home, Home Health        Name of Home Health Agency Placement: [facility]                          Mode of Transportation: Car                                      Physical Discharge Disposition: Home, Home Health       Provider Notifications:        Marita Snellen RN BSN  Case Manager 4 Surg  Ext. [phone]

## 2018-08-30 NOTE — Anesthesia Postprocedure Evaluation (Signed)
Post Operative Evaluation    Patient: [name]    Procedures performed: Procedure(s) with comments:  DEBRIDEMENT & IRRIGATION, WOUND CLOSURE - Right forearm I&D poss. wound closure    Patient location: PACU    Last vitals:   Vitals:    08/30/18 0107   BP: 123/70   Pulse: 82   Resp: 12   Temp: 36.4 C (97.5 F)   SpO2: 99%       BP: 123/70   Temp: 36.4 C (97.5 F)   Heart Rate: 82   Resp Rate: 12        Post pain: Pain control adequate, continue current therapy     Mental Status:  Awake, alert    Respiratory Function: room air    Cardiovascular: stable    Nausea/Vomiting: therapy adequate; appropriate medications received    Hydration Status: adequate    Post assessment: no apparent anesthetic complications, no reportable events and no evidence of recall

## 2018-08-30 NOTE — Progress Notes (Signed)
NURSE NOTE SUMMARY  [facility] - 4TH SURGICAL   Patient Name: [name]   Attending Physician: Leory Plowman, MD   Today's date:   08/30/2018 LOS: 0 days   Shift Summary:                                                              0700-1900 - VSS, pain 4-7/10. At start of shift pt A/O x 4 but had mild expressive dysphasia and memory recall delays that she attributed to her medication schedule being different than her usual home routine. Pt removed IV at start of shift and later in the day developed a skin tear on posterior L hand that appears to be a skin tear, perhaps from previous tape covering IV access site. Critical Na of 123 @ 1249 reported to NP. NS @ 125 continues. Safety precautions in place, pt denies further needs.      Provider Notifications:      Rapid Response Notifications:  Mobility:      PMP Activity: Step 3 - Bed Mobility;Step 4 - Dangle at Bedside (08/30/2018  9:00 AM)     Weight tracking:  Family Dynamic:   Last 3 Weights for the past 72 hrs (Last 3 readings):   Weight   08/29/18 1910 69.9 kg (154 lb 1.6 oz)             Recent Vitals Last Bowel Movement   BP: 108/57 (08/30/2018  7:37 PM)  Heart Rate: 74 (08/30/2018  7:37 PM)  Temp: 97.3 F (36.3 C) (08/30/2018  7:37 PM)  Resp Rate: 16 (08/30/2018  7:37 PM)  Height: 1.676 m (5\' 6" ) (08/29/2018  7:10 PM)  Weight: 69.9 kg (154 lb 1.6 oz) (08/29/2018  7:10 PM)  SpO2: 95 % (08/30/2018  7:37 PM)   Last BM Date: 08/06/18

## 2018-08-30 NOTE — Progress Notes (Signed)
DAILY PROGRESS NOTE - ACCESS   Name:  [name], [name]     DOB:  07-07-1960   MR#:  [id number]               ROOM: 471/471-A    DATE:  08/30/18      Principal Diagnosis:  Dog bite    Refer to below for diagnoses being addressed for this encounter       ASSESSMENT & PLAN:                                                              Hospital Day: 2    s/p Dog bite  S/p wound debridement, irrigation, & closure 5/21    CONDITION:  rapidly improving    Patient Active Hospital Problem List:   Dog bite (08/29/2018)    Assessment: POD#1 from wound debridement & closure    Plan: wound care consult, antibiotics, pain control     Chronic alcoholic pancreatitis (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  May need home health for wound care- CM consulted    Theotis Burrow, NP   X [phone]  [facility] ACCESS Program     Incidental findings:  none  Additional Info:  Care plan and expectations reviewed with Patient  Discussed with bedside RN  Patient seen with WOCN.       I have reviewed the notes, assessments, and/or procedures performed by Cristi Loron  , I concur with her/his documentation of [name].      S/p wound washout and partial closure.  Hyponatremia and AKI noted.  Improved with IVF hydration.  Continue supportive care.  Likely d/c home tomorrow.      Terrill Mohr MD  ACCESS (Acute Care and Emergency Surgery Service)  [facility]  Phone # [phone]  Pager # [phone]      Subjective/Chief Complaint & ROS:   Sitting up in bed, expressing  anxiety about situation, medications, and wanting to get home today. Denies pain and numbness/tingling at this time    24-hour interval history:  Admitted, suture repair of for arm lac    Meds:   ampicillin-sulbactam, 3 g, Intravenous, Q6H  docusate sodium, 100 mg, Oral, BID  heparin (porcine), 5,000 Units, Subcutaneous, Q8H SCH  lactobacillus species, 50 Billion CFU, Oral, Daily  pancrelipase (Lip-Prot-Amyl), 72,000 units of lipase, Oral, TID MEALS  sodium bicarbonate, 650 mg, Oral, TID  vitamin B-1, 100 mg, Oral, BID        Infusions:   lactated ringers          DVT Prophylaxis:  heparin SC  Foley:  No  GI Prophylaxis:  No            BM last 24 Hours:  No   Bowel Regimen:  Yes  Diet:  Diet regular     I & O's:  Data reviewed UO - x1  Pain: well controlled  EXAM:   Vitals:  Blood pressure 102/63, pulse 78, temperature 97.3 F (36.3 C), temperature source Temporal, resp. rate 17, height 1.676 m (5\' 6" ), weight 69.9 kg (154 lb 1.6 oz), SpO2 94 %.   General:   in no apparent distress, appears comfortable, non-toxic  Pulmonary:   lungs clear to ausculation, equal breath sounds, unlabored respirations  Cardiac:   regular rate and rhythm without murmurs/rubs/gallops  Abdomen:   active bowel sounds, soft, nontender, non-distended  Neuro:   alert, speech normal, oriented x 3, no gross focal neurologic deficits, moves all extremities well, sensation to light touch intact RU extremities  Psych:   anxious  Extremities:   right arm wound soiled dressing removed, scant serosanguinous drainage, wound on dorsal side with sutures intact, ventral side with puncture opening.  mepilex silver foam applied and secured with kerlix  Skin:   normothermic    Lab Data Reviewed:  Yes - Cr elevated on admission without history of chronic kidney disease    Cultures:    Microbiology Results     None             CHEM:   Recent Labs   Lab 08/29/18  2129   Glucose 94   Sodium 119*   Potassium 4.0   Chloride 87*   CO2 15.8*   BUN 24*    Creatinine 1.98*   Calcium 9.2     CBC:     Recent Labs   Lab 08/29/18  2129   WBC 9.7   Hemoglobin 11.6*   Hematocrit 33.8*   PLT CT 253   MCV 102*     BANDS:      POCT:      LFTs:        COAG:      Lactate:        Radiology:   Xr Forearm Right 2 Views    Result Date: 08/29/2018  1.  No evidence of acute fracture. 2.  No evidence of retained radiopaque foreign body. ReadingStation:[id number]

## 2018-08-30 NOTE — Discharge Instr - AVS First Page (Addendum)
HOME HEALTH     [facility] has been arranged to see you for home health care. The home health nurse will call you to arrange your first visit once you are discharged. If you have any questions prior to the visit please call the home health agency at [phone].          Diet:  Resume previous home diet as tolerated.  No restrictions.    Activity:  No strenuous activity or lifting greater than 10-15 pounds x 2 weeks.    Medications:  Refer to the After Visit Summary for updates and new medications.  You may add over-the-counter non-steroidal medications (NSAIDs) such as Ibuprofen or Naproxen as needed for pain control.    Wound/Drain Care:  cover wound with nonadherent dressing and dress with gauze daily and as needed.    Instructions:  Call/Return if temperature over 101.0, redness or purulent (pus) drainage or warmth to your incision(s), refractory nausea/vomiting, increased pain not relieved by medications, or new onset of chest pain or shortness or breath.  Call/Return if temperature over 101.0, refractory nausea/vomiting, increased pain not relieved by medications, or new onset of chest pain or shortness or breath.    For additional questions or concerns after your discharge, you may contact the ACCESS office at [phone] between the hours of 8:00am - 4:30pm.  Any medication refill requests should be called to the office during daytime business hours.  If you have an urgent need that occurs after office hours, call [phone] and ask to speak to the ACCESS physician on call.  No medication refills will be handled outside of business hours.  Emergencies should go to the Emergency Room and cannot be handled over the phone.    ACCESS BOWEL MAINTENANCE GUIDELINES    The use of narcotic pain medications and decreased activity will lead to constipation.  In order to prevent this from happening to you, please follow these recommendations:  1. Drink adequate amounts of fluid throughout the day.    2. Take  an over-the-counter stool softener, such as Colace, once or twice a day depending on the firmness of your stools.  3. You may take another over-the-counter stool softener, such as Senokot or generic Senna, two tablets at bedtime.  4. Add Metamucil or other similar fiber supplement to your diet twice a day.    If constipation persists:   1. Add Milk of Magnesia two (2) tablespoons (30mL) every 6    hours (up to 8 doses).  2. If you are experiencing rectal fullness or pressure, try a Dulcolax and/or   glycerin suppository every 12 hours.     Avoid anti-diarrhea medications once your bowels begin to move.  Loose         stools should resolve on their own after 2-3 days.   Gas relief medications such as Gas-X or Gaviscon are acceptable for use.     Probiotic food products, such as yogurt, are acceptable especially if you are or have been on antibiotics.    Additional Recipe for Success (Natural Stool Softener):    -1 cup of unsweetened applesauce,   - cup of unsweetened prune juice,   -1 cup of unprocessed bran (ex., Millers brand - can be found at  [employer], [place],        [place], or other health food stores).    Mix together and store in refrigerator.    Take 2 tablespoons with a large glass of water daily and expect results  6-8 hours later.  You may add 1 tablespoon every 5-7 days if needed.   *can be used as long as desired, non-addictive, & gentle

## 2018-08-30 NOTE — UM Notes (Addendum)
[facility] Utilization Management Review Sheet    Facility :  [facility]    NAME: [name]  MR#: [id number]  DOB: 14-Apr-1960  CSN#: [id number]  ROOM: 471/[id number]-A AGE: 58 y.o.    ADMIT DATE AND TIME: 08/29/2018  8:04 PM  PATIENT CLASS: OBSERVATION 08/30/18 0103    ATTENDING PHYSICIAN: Leory Plowman, MD  PAYOR:Payor: /     MCG:   DIAGNOSIS:     ICD-10-CM    1. Dog bite of right forearm, initial encounter S51.851A     W54.0XXA      HISTORY:   Past Medical History:   Diagnosis Date    Ascites     Asthma without status asthmaticus     Clostridium difficile colitis     Disorder of liver     multifocal cystic irregularities    DVT (deep venous thrombosis)     Gastroesophageal reflux disease     H/O ETOH abuse     sober 2 years until 03/2014    Hypertension     Hypokalemia     Pancreatitis     S/P IVC filter        DATE OF REVIEW: 5/21-5/22/2020    SURGERY H&P PER DR. MAKARI:   CC:    Patient complains of dog bite to right forearm    HPI:   58 y.o. female  presents after she sustained a dog bite to the right forearm, the dog that bit her is her roommates dog, she has lived with his dog for a long time, this will be the fourth time she was bitten by this dog, she reports no chance of rabies infection and the dog, she reports that she approached the dog and caught the dog by surprise and the dog went and bit her in the right forearm, she reports some pain at the site, her last bite involve the right hand and has healed.  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 female with the following active problems:      Active Hospital Problems    Diagnosis    Dog bite    Chronic alcoholic pancreatitis    History of partial pancreatectomy     The patient has a large open wound in the right forearm, dorsal aspect with a puncture wound on the volar aspect, this has exposed muscle, no  obvious tendinous injury, no bony injuries on x-ray, I recommend proceeding to the operating room for a washout, possible partial closure, she understands that she might require multiple operations, might require further debridement in the future, understands the risk of infection especially in the setting of a dog bite, Unasyn has been ordered by the emergency department and will be continued after surgery     PLAN:   Patient will be admitted to: OR  Active Problems Plan       Active Hospital Problems    Diagnosis    Dog bite    Chronic alcoholic pancreatitis    History of partial pancreatectomy    Proceed to the operating room for washout, debridement, possible partial closure of right forearm wounds  Patient had received tetanus shot last month when she had another dog bite  Patient lives with a dog that bit her and has bit her before, this is a domestic dog and according to the patient does not have any chance of rabies  Radiology:   Xr Forearm Right 2 Views  Result Date: 08/29/2018  1.  No evidence of acute fracture. 2.  No evidence of retained radiopaque foreign body.    VITAL SIGNS: T 97.88F HR 106 BP 122/83 RR 20 SAT 97% PAIN 10/10 PER PATIENT     LABS: HGB 11.6/HCT 33.8; RBC 3.3; BUN 24; CREAT 1.98; NA 119; CL 87; CO2 15.8; ANION GAP 20.2; EGFR 27; OSMO 244;     XRAY RIGHT FOREARM: IMPRESSION:   1.  No evidence of acute fracture.  2.  No evidence of retained radiopaque foreign body.    ED MEDS: MORPHINE 2MG  IV X1; ZOFRAN 4MG  IV X1;   Scheduled Meds:  Current Facility-Administered Medications   Medication Dose Route Frequency    amLODIPine  5 mg Oral Daily    ampicillin-sulbactam  3 g Intravenous Q6H    docusate sodium  100 mg Oral BID    heparin (porcine)  5,000 Units Subcutaneous Q8H SCH    lactobacillus species  50 Billion CFU Oral Daily    multivitamin  1 tablet Oral QAM    pancrelipase (Lip-Prot-Amyl)  72,000 units of lipase Oral TID MEALS    sodium bicarbonate  650 mg Oral TID    vitamin  B-1  100 mg Oral BID     Continuous Infusions:   lactated ringers 125 mL/hr at 08/30/18 1055     PRN Meds: FENTANYL IV Q5MIN PRN X4; NORCO 5-325MG  2 TABS PO Q4HR PRN X1    SURGICAL OP NOTE:   Anesthesia/Sedation:   general    Operative Procedure:   Procedure(s):  DEBRIDEMENT & IRRIGATION, WOUND CLOSURE - Wound Class: Contaminated     Preoperative Diagnosis:   Pre-Op Diagnosis Codes:     * Dog bite, initial encounter [V78.0XXA]    Postoperative Diagnosis:   Post-Op Diagnosis Codes:     * Dog bite, initial encounter [I69.0XXA]    Wound Class:   Contaminated     Indications:   58 year old lady who sustained a dog bite to the right forearm, found to have 2 wounds on the right forearm the dorsal wound with exposed muscle, measured about 4 x 3 cm    Findings:   Dorsal wound with exposed muscle and loss of fascial covering on the right forearm, smaller volar wound, both irrigated, the dorsal wound was partially closed with overlying skin    Estimated Blood Loss:    10 mL    Implants:   * No implants in log *    Specimens:   None   * No specimens in log *    Complications:   * No complications entered in OR log *    SURGERY PROGRESS NOTE:   ASSESSMENT & PLAN:                                                              Hospital Day: 2    s/p Dog bite  S/p wound debridement, irrigation, & closure 5/21    CONDITION:    rapidly improving    Patient Active Hospital Problem List:   Dog bite (08/29/2018)    Assessment: POD#1 from wound debridement & closure    Plan: wound care consult, antibiotics, pain control     Chronic alcoholic pancreatitis (11/24/2015)  History of partial pancreatectomy (11/24/2015)    Assessment: chronic, stable    Plan: home meds    AKI     Assessment: POA, Cr 1.98 on this admission, Cr 0.73-1.35 on recent admission     Plan: IVF, labs this afternoon    Hypertension     Assessment:controlled     Plan: monitor vitals, home norvasc    Asthma without status     Assessment: chronic, no  exacerbation     Plan: home inhaler    Wound Care- mepilex silver to wound, secured with kerlix  IVF  Labs at noon  Dispo- home if Cr improved.  May need home health for wound care- CM consulted    WOCRN:Bland NP. Patient has a wound to the right forearm, measures 4.0 cm x 3.0 cm with sutures.            CM Comments: (P) 5/22 RNCM DP: Adm s/p dog bite. Amb. IS. Reg. Wound care. Indep/Drive. [facility] arranged, F2F sent. Indigent visits approved x2. Denies other needs. D/C home today w/roommate supp/trans.    Physical Discharge Disposition: Home, Home Health  Name of Home Health Agency Placement: [facility]    Trevor Iha, RN, BSN  Utilization Management  [facility]  [address]  [place], [place] [address]  Work: [phone]  Fax: [phone]  [email or web address]

## 2018-08-30 NOTE — Progress Notes (Signed)
Patient seen with Marlowe Alt NP. Patient has a wound to the right forearm, measures 4.0 cm x 3.0 cm with sutures.        Volar wound  mepilex ag applied and covered with 4 x4's and kerlix

## 2018-08-30 NOTE — Transfer of Care (Signed)
Anesthesia Transfer of Care Note      Patient Name:     [name]    Procedures Performed:     Procedure(s) with comments:  DEBRIDEMENT & IRRIGATION, WOUND CLOSURE - Right forearm I&D poss. wound closure    Anesthesia Type:     General    Patient Location:     PACU    Last Vitals:     Vitals:    08/30/18 0107   BP: 123/70   Pulse: 82   Resp: 12   Temp: 36.4 C (97.5 F)   SpO2: 99%       Post Pain:     Patient not complaining of pain, continue current therapy    Mental Status:     Awake    Respiratory Function:     Adequate    Cardiovascular:       Stable    Nausea/Vomiting:      Patient not complaining of nausea     Hydration Status:     Adequate    Post Assessment:      No apparent anesthetic complications, no reportable events     [place] H Bee Hammerschmidt, DO  08/30/2018 1:08 AM

## 2018-08-30 NOTE — Plan of Care (Addendum)
NURSE NOTE SUMMARY  [facility] - 4TH SURGICAL   Patient Name: [name]   Attending Physician: Leory Plowman, MD   Today's date:   08/31/2018 LOS: 0 days   Shift Summary:                                                              1900 assumed care of patient   2030 MD paged for request to switch norco to percocet--> orders to follow   Uneventful shift, VSS on RA  Up to bathroom independently in room, voiding WNL   Dressing to R FA remains intact, small amount of brown drainage this morning   Skin tear to R elbow bleeding, band aid applied   Fluids infusing as ordered through LFA IV      Provider Notifications:      Rapid Response Notifications:  Mobility:      PMP Activity: Step 7 - Walks out of Room (08/30/2018  9:00 PM)     Weight tracking:  Family Dynamic:   Last 3 Weights for the past 72 hrs (Last 3 readings):   Weight   08/29/18 1910 69.9 kg (154 lb 1.6 oz)             Recent Vitals Last Bowel Movement   BP: 115/68 (08/31/2018  2:51 AM)  Heart Rate: 68 (08/31/2018  2:51 AM)  Temp: 98.1 F (36.7 C) (08/31/2018  2:51 AM)  Resp Rate: 17 (08/31/2018  2:51 AM)  SpO2: 93 % (08/31/2018  2:51 AM)   Last BM Date: 08/29/18         Problem: Moderate/High Fall Risk Score >5  Goal: Patient will remain free of falls  Outcome: Progressing

## 2018-08-30 NOTE — Progress Notes (Signed)
9604- Dr. Emilio Math paged r/t patient worried about missing thiamine 100 mg dose yesterday. Reports she takes it BID for memory loss and needs to take a dose now. Orders received to add thiamine as patient takes at home.

## 2018-08-30 NOTE — Consults (Signed)
RNCM aware of consult. Pt in need of HH to assist in wound care. Currently unfunded. Indigent visits x2 approved. [facility] arranged. F2F completed.     Marita Snellen RN BSN  Case Manager 4 Surg  Ext. [phone]

## 2018-08-31 LAB — BASIC METABOLIC PANEL
Anion Gap: 10.7 mMol/L (ref 7.0–18.0)
BUN / Creatinine Ratio: 25.3 Ratio (ref 10.0–30.0)
BUN: 23 mg/dL — ABNORMAL HIGH (ref 7–22)
CO2: 21 mMol/L (ref 20–30)
Calcium: 8.4 mg/dL — ABNORMAL LOW (ref 8.5–10.5)
Chloride: 100 mMol/L (ref 98–110)
Creatinine: 0.91 mg/dL (ref 0.60–1.20)
EGFR: 70 mL/min/{1.73_m2} (ref 60–150)
Glucose: 116 mg/dL — ABNORMAL HIGH (ref 71–99)
Osmolality Calculated: 262 mOsm/kg — ABNORMAL LOW (ref 275–300)
Potassium: 3.7 mMol/L (ref 3.5–5.3)
Sodium: 128 mMol/L — ABNORMAL LOW (ref 136–147)

## 2018-08-31 LAB — CBC AND DIFFERENTIAL
Basophils %: 0.1 % (ref 0.0–3.0)
Basophils Absolute: 0 10*3/uL (ref 0.0–0.3)
Eosinophils %: 0.7 % (ref 0.0–7.0)
Eosinophils Absolute: 0.1 10*3/uL (ref 0.0–0.8)
Hematocrit: 28.3 % — ABNORMAL LOW (ref 36.0–48.0)
Hemoglobin: 9.6 gm/dL — ABNORMAL LOW (ref 12.0–16.0)
Lymphocytes Absolute: 1.5 10*3/uL (ref 0.6–5.1)
Lymphocytes: 17.6 % (ref 15.0–46.0)
MCH: 36 pg — ABNORMAL HIGH (ref 28–35)
MCHC: 34 gm/dL (ref 32–36)
MCV: 107 fL — ABNORMAL HIGH (ref 80–100)
MPV: 6.9 fL (ref 6.0–10.0)
Monocytes Absolute: 0.8 10*3/uL (ref 0.1–1.7)
Monocytes: 10 % (ref 3.0–15.0)
Neutrophils %: 71.6 % (ref 42.0–78.0)
Neutrophils Absolute: 6 10*3/uL (ref 1.7–8.6)
PLT CT: 190 10*3/uL (ref 130–440)
RBC: 2.66 10*6/uL — ABNORMAL LOW (ref 3.80–5.00)
RDW: 13.5 % (ref 11.0–14.0)
WBC: 8.4 10*3/uL (ref 4.0–11.0)

## 2018-08-31 LAB — MAGNESIUM: Magnesium: 1.6 mg/dL (ref 1.6–2.6)

## 2018-08-31 LAB — PHOSPHORUS: Phosphorus: 2.7 mg/dL (ref 2.3–4.7)

## 2018-08-31 MED ORDER — AMOXICILLIN-POT CLAVULANATE 875-125 MG PO TABS
1.00 | ORAL_TABLET | Freq: Two times a day (BID) | ORAL | 0 refills | Status: AC
Start: 2018-08-31 — End: 2018-09-03

## 2018-08-31 NOTE — Progress Notes (Signed)
NURSE NOTE SUMMARY  [facility] - 4TH SURGICAL   Patient Name: [name]   Attending Physician: Leory Plowman, MD   Today's date:   08/31/2018 LOS: 0 days   Shift Summary:                                                              [id number] - Uneventful morning. Pt seen by Dr. Reola Calkins who changed dressing to RFA. Periph IV removed, catheter intact. Dressing to skin tear on posterior R hand changed. Discharge instructions reviewed with pt. Abx prescription sent to [facility] pharmacy on [facility]. Pt denies further needs or questions. Left via wheelchair with personal belongings.     Provider Notifications:      Rapid Response Notifications:  Mobility:      PMP Activity: Step 3 - Bed Mobility;Step 4 - Dangle at Bedside;Step 6 - Walks in Room (08/31/2018  9:00 AM)     Weight tracking:  Family Dynamic:   Last 3 Weights for the past 72 hrs (Last 3 readings):   Weight   08/29/18 1910 69.9 kg (154 lb 1.6 oz)             Recent Vitals Last Bowel Movement   BP: 118/80 (08/31/2018  9:32 AM)  Heart Rate: 63 (08/31/2018  7:53 AM)  Temp: 97.9 F (36.6 C) (08/31/2018  7:53 AM)  Resp Rate: 17 (08/31/2018  7:53 AM)  SpO2: 96 % (08/31/2018  7:53 AM)   Last BM Date: 08/29/18

## 2018-08-31 NOTE — Discharge Instructions (Signed)
Dog Bite  A dog bite can cause a wound deep enough to break the skin. In such cases, the wound is cleaned andsometimesclosed. Wounds would be closed if they are gaping open or in cosmetically important areas such as the face.Ifthe wound isclosed,it is usually notcompletelyclosed. This is so that fluid can drain if the wound becomes infected.Often, wounds will be left open to heal.In addition to wound care, a tetanus shot (injection) may be given, if needed.     Home care   Wash your hands well with soap and warm water before and after caring for the wound. This helps lower the risk of infection.   Care for the wound as directed. If a dressing was applied to the wound, be sure to change it as directed.   If the wound bleeds, place a clean, soft cloth on the wound. Then firmly apply pressure until the bleeding stops. This may take up to 5 minutes. Don't release the pressure and look at the wound during this time.   Most wounds heal within 10 days. But an infection can occur even with correct treatment. So be sure to check the wound daily for signs of infection (see below).   Antibiotics may be prescribed. These help prevent or treat infection. If you're given antibiotics, take them as directed. Also be sure to complete the medicines.  Rabies prevention  Rabies is a virus that can be carried in certain animals. These can include domestic animals such as dogs and cats. Pets fully vaccinated against rabies (2 shots) are at very low risk of infection. But because human rabies is almost always fatal, any biting pet should be confined for 10 days as an extra precaution. In general, if there is a risk for rabies, the following steps may need to be taken:    If someone's pet dog has bitten you, it should be kept in a secure area for the next 10 days to watch for signs of illness. (If the pet owner won't allow this, contact your local animal control center.) Ask to see the pet's vaccination history records.  If the dog becomes ill or dies during that time, contact your local animal control center at once so the animal may be tested for rabies. If the dog stays healthy for the next 10 days, there is no danger of rabies in the animal or you.  ? If a stray dog bit you, contact your local animal control center. They can give information on capture, quarantine, and animal rabies testing.  ? If you can't find the animal that bit you in the next 2 days, and if rabies exists in your area, you may need to receive the rabies vaccine series. Call your healthcare provider right away. Or return to the emergency department promptly.  ? All animal bites should be reported to the local animal control center. If you were not given a form to fill out, you can report this yourself.  Follow-up care  Follow up with your healthcare provider, or as directed.  When to get medical advice  Call your healthcare provider or get medical attention right away if any of these occur:    Signs of infection:  ? Spreading redness or warmth from the wound  ? Increased pain or swelling  ? Fever of 100.4F (38C) or higher, or as directed by your healthcare provider  ? Colored fluid or pus draining from the wound   Signs of rabies infection. Don't wait for any of these symptoms   to occur! If you suspect that the dog that bit you is rabid, or if the dog is lost and can't be found, you should get the vaccine series.  ? Headache  ? Confusion  ? Strange behavior  ? Increased salivating and drooling  ? Seizure  ? Hallucination, anxiety, or agitation  ? Fever   Decreased ability to move any body part near the wound   Bleeding that can't be stopped after 5 minutes of firm pressure  [employer] last reviewed this educational content on 11/08/2017   2000-2020 [employer] [address], [place] [address]. All rights reserved. This information is not intended as a substitute for professional medical care. Always follow your healthcare  professional's instructions.

## 2018-08-31 NOTE — Discharge Summary (Signed)
DISCHARGE SUMMARY - ACCESS   Name:  [name], [name]     DOB:  20-Dec-1960     MR#:  [id number]         Admit Date:  08/29/2018 TO D/C Date: 08/31/18 [facility] Day: 3 )    Admit to:  [facility]  Discharge from:  [facility]      Discharge Diagnoses:     Active Hospital Problems    Diagnosis POA    Principal Problem: Dog bite Yes    Hypertension Yes    Asthma without status asthmaticus Yes    Chronic alcoholic pancreatitis Yes     Chronic    History of partial pancreatectomy Not Applicable     Chronic      Resolved Hospital Problems    Diagnosis POA    AKI (acute kidney injury) Yes        PMH:   has a past medical history of Ascites, Asthma without status asthmaticus, Clostridium difficile colitis, Disorder of liver, DVT (deep venous thrombosis), Gastroesophageal reflux disease, H/O ETOH abuse, Hypertension, Hypokalemia, Pancreatitis, and S/P IVC filter.     Brief Presentation History:  Refer to admission H&P for complete admission details.    58 y.o. female  presents after she sustained a dog bite to the right forearm, the dog that bit her is her roommates dog, she has lived with his dog for a long time, this will be the fourth time she was bitten by this dog, she reports no chance of rabies infection and the dog, she reports that she approached the dog and caught the dog by surprise and the dog went and bit her in the right forearm, she reports some pain at the site, her last bite involve the right hand and has healed.  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        Hospital Course:    Admitted to ACCESS.  On HD#1, she underwent washout and debridement of R forearm wound.  She tolerated well.  Remained on IV antibiotics, optimized pain control.  On HD #3, she was doing well clinically with stable  wound.  Her Na was 119 on admission, she received NS IVF and had relatively rapid improvement.  On HD#3, her Na was 128.  She remained asymptomatic throughout this treatment.  She was discharged home.      Operative and Other Procedures:   Procedure(s):  WASHOUT AND CLOSURE OF TRAUMATIC WOUND, RIGHT FOREARM - Dr. Emilio Math on 5/21    Cultures/Pathology:   None     Consultations:  None    Condition:  Good    Discharge Meds:        Discharge Medication List      Taking    albuterol 108 (90 Base) MCG/ACT inhaler  Dose:  2 puff  Commonly known as:  PROVENTIL HFA  Inhale 2 puffs into the lungs every 6 (six) hours as needed for Wheezing     amLODIPine 5 MG tablet  Dose:  5 mg  Commonly known as:  NORVASC  Take 1 tablet (5 mg total) by mouth daily     amoxicillin-clavulanate 875-125 MG per tablet  Dose:  1 tablet  Commonly known as:  AUGMENTIN  Take 1 tablet by mouth 2 (two) times daily for 3 days  furosemide 20 MG tablet  Dose:  20 mg  Commonly known as:  LASIX  For:  Edema  Take 1 tablet (20 mg total) by mouth daily as needed (for legs edema or abdominal swelling)     ibuprofen 200 MG tablet  Dose:  400 mg  Commonly known as:  ADVIL  Take 400 mg by mouth every 6 (six) hours as needed for Pain.     multivitamin with minerals tablet  Dose:  1 tablet  Take 1 tablet by mouth every morning.      naloxone 4 MG/0.1ML nasal spray  Commonly known as:  NARCAN  For:  Opioid Overdose  1 spray intranasally. If pt does not respond or relapses into respiratory depression call 911. Give additional doses every 2-3 min.     ondansetron 4 MG disintegrating tablet  Dose:  4 mg  Commonly known as:  ZOFRAN-ODT  Take 1 tablet (4 mg total) by mouth every 8 (eight) hours as needed for Nausea     pancrelipase (Lip-Prot-Amyl) 24000-76000 units Cpep  Dose:  72,000 units of lipase  Commonly known as:  CREON 24000  Take 72,000 units of lipase by mouth 3 (three) times daily with meals     potassium chloride 20 MEQ tablet  Dose:  20 mEq  What changed:   when to take this  Commonly known as:  KLOR-CON  Take 1 tablet (20 mEq total) by mouth 2 (two) times daily.     sodium bicarbonate 650 MG tablet  Dose:  650 mg  Take 1 tablet (650 mg total) by mouth 3 (three) times daily     spironolactone 50 MG tablet  Dose:  50 mg  Commonly known as:  ALDACTONE  Take 1 tablet (50 mg total) by mouth daily     traMADol 50 MG tablet  Dose:  50 mg  Commonly known as:  ULTRAM  Take 1 tablet (50 mg total) by mouth every 6 (six) hours as needed for Pain     vitamin B-1 100 MG tablet  Dose:  100 mg  Commonly known as:  THIAMINE  Take 100 mg by mouth daily             Follow-up:   [facility] in 5-7 days.   None  No future appointments.    Patient Instructions:                              Diet:  Resume previous home diet as tolerated.  No restrictions.    Activity:  No strenuous activity or lifting greater than 10-15 pounds x 2 weeks.    Medications:  Refer to the After Visit Summary for updates and new medications.  You may add over-the-counter non-steroidal medications (NSAIDs) such as Ibuprofen or Naproxen as needed for pain control.    Wound/Drain Care:  cover wound with nonadherent dressing and dress with gauze daily and as needed.    Instructions:  Call/Return if temperature over 101.0, redness or purulent (pus) drainage or warmth to your incision(s), refractory nausea/vomiting, increased pain not relieved by medications, or new onset of chest pain or shortness or breath.  Call/Return if temperature over 101.0, refractory nausea/vomiting, increased pain not relieved by medications, or new onset of chest pain or shortness or breath.      Leory Plowman, MD   [facility]    The time spent to complete this discharge and involving patient  care today took less than 30 minutes.

## 2018-08-31 NOTE — Progress Notes (Signed)
DAILY PROGRESS NOTE - ACCESS   Name:  [name], [name]     DOB:  04-07-1961   MR#:  [id number]               ROOM: [id number]    DATE:  08/31/18      Principal Diagnosis:  Dog bite    Refer to below for diagnoses being addressed for this encounter       ASSESSMENT & PLAN:                                                              Hospital Day: 3    s/p Dog bite  S/p wound debridement, irrigation, & closure 5/21    CONDITION:    rapidly improving    Patient Active Hospital Problem List:   Dog bite (08/29/2018)    Assessment:  wound debridement & closure on 5/21    Plan: wound care, antibiotics, pain control     Chronic alcoholic pancreatitis (11/24/2015)   History of partial pancreatectomy (11/24/2015)    Assessment: chronic, stable    Plan: home meds    AKI     Assessment: resolved     Plan: no further treatment    Hypertension     Assessment:controlled     Plan: monitor vitals, home norvasc    Asthma without status     Assessment: chronic, no exacerbation     Plan: home inhaler     Hyponatremia improved.     Wound Care- mepilex silver to wound, secured with kerlix, home health for wound care      Ok to d/c home today.      Leory Plowman, MD   X [phone], Pager #[phone]  [facility] ACCESS Program     Incidental findings:  none  Additional Info:      Subjective/Chief Complaint & ROS:   Pain well controlled.    24-hour interval history:  No acute events overnight    Meds:   amLODIPine, 5 mg, Oral, Daily  ampicillin-sulbactam, 3 g, Intravenous, Q6H  docusate sodium, 100 mg, Oral, BID  heparin (porcine), 5,000 Units, Subcutaneous, Q8H SCH  lactobacillus species, 50 Billion CFU, Oral, Daily  multivitamin, 1 tablet, Oral, QAM  pancrelipase (Lip-Prot-Amyl), 72,000 units of lipase, Oral, TID MEALS  sodium bicarbonate, 650 mg, Oral, TID  vitamin B-1, 100 mg, Oral, BID        Infusions:   sodium chloride 125 mL/hr at 08/31/18 0534        DVT Prophylaxis:  heparin SC  Foley:  No          GI Prophylaxis:  No            BM  last 24 Hours:  No   Bowel Regimen:  Yes  Diet:  Diet regular     I & O's:  Data reviewed UO - 2100  Pain: reasonably controlled  EXAM:   Vitals:  Blood pressure 118/80, pulse 63, temperature 97.9 F (36.6 C), temperature source Temporal, resp. rate 17, height 1.676 m (5\' 6" ), weight 69.9 kg (154 lb 1.6 oz), SpO2 96 %.   General:   in no apparent distress, appears comfortable, non-toxic  Neuro:   alert, speech normal, oriented x 3, no gross focal neurologic deficits, moves all extremities  well, sensation to light touch intact RU extremities  Psych:   calm and cooperative  Extremities:   right arm wound soiled dressing removed, scant serosanguinous drainage, wound on dorsal side with sutures intact, ventral side with puncture opening.  wound is stable.  Some minimal residual necrosis seen.  Skin:   normothermic    Lab Data Reviewed:  Yes -     Cultures:  None      CHEM:   Recent Labs   Lab 08/31/18  0427 08/30/18  1136 08/29/18  2129   Glucose 116* 161* 94   Sodium 128* 123* 119*   Potassium 3.7 4.2 4.0   Chloride 100 93* 87*   CO2 21 20 15.8*   BUN 23* 28* 24*   Creatinine 0.91 1.40* 1.98*   Calcium 8.4* 8.8 9.2   Magnesium 1.6  --   --    Phosphorus 2.7  --   --      CBC:     Recent Labs   Lab 08/31/18  0427 08/29/18  2129   WBC 8.4 9.7   Hemoglobin 9.6* 11.6*   Hematocrit 28.3* 33.8*   PLT CT 190 253   MCV 107* 102*     BANDS:      POCT:      LFTs:        COAG:      Lactate:        Radiology:   No results found.

## 2018-09-02 ENCOUNTER — Other Ambulatory Visit (INDEPENDENT_AMBULATORY_CARE_PROVIDER_SITE_OTHER): Payer: Self-pay | Admitting: Licensed Practical Nurse

## 2018-09-02 DIAGNOSIS — E871 Hypo-osmolality and hyponatremia: Secondary | ICD-10-CM

## 2018-09-03 ENCOUNTER — Encounter: Payer: Self-pay | Admitting: Surgery

## 2018-09-05 ENCOUNTER — Telehealth (INDEPENDENT_AMBULATORY_CARE_PROVIDER_SITE_OTHER): Payer: Self-pay

## 2018-09-05 NOTE — Telephone Encounter (Signed)
Attempted to reach this patient several yesterday and today. No answer. LVM to call access to make a follow up appointment

## 2018-09-12 ENCOUNTER — Other Ambulatory Visit: Payer: Self-pay | Admitting: Internal Medicine

## 2018-09-12 MED ORDER — VITAMIN B-1 100 MG PO TABS: 100.0000 mg | ORAL_TABLET | Freq: Every day | ORAL | 3 refills | Status: AC

## 2018-09-12 MED ORDER — PANTOPRAZOLE SODIUM 40 MG PO TBEC: 40.0000 mg | DELAYED_RELEASE_TABLET | Freq: Every day | ORAL | 1 refills | Status: AC

## 2018-09-12 MED ORDER — ONDANSETRON HCL 4 MG PO TABS: 4.0000 mg | ORAL_TABLET | Freq: Every day | ORAL | 5 refills | Status: AC | PRN

## 2018-09-12 NOTE — Telephone Encounter (Signed)
Needs refill on thiamine (VITAMIN B-1) 100 MG tablet  ondansetron (ZOFRAN) 4 MG tablet  pantoprazole (PROTONIX) 40 MG tablet  ;pt contact [phone]  Change pharmacy:  [facility]

## 2018-09-13 ENCOUNTER — Telehealth (INDEPENDENT_AMBULATORY_CARE_PROVIDER_SITE_OTHER): Payer: Self-pay

## 2018-09-13 ENCOUNTER — Encounter (INDEPENDENT_AMBULATORY_CARE_PROVIDER_SITE_OTHER): Payer: Self-pay

## 2018-09-13 NOTE — Telephone Encounter (Signed)
HH has D/C this patient. Wound is healing well but she still has stitches. I've been unable to reach this patient via phone. I will continue to try.

## 2018-09-13 NOTE — Telephone Encounter (Signed)
Ok, let me know if you need me to do anything

## 2018-09-17 IMAGING — CT CT ABD-PELV W/ CM
2 of 5 series · 7 of 46 positions shown, 8 images · IV contrast (Iodine)
Comparison: None

CLINICAL DATA: Abdominal pain, history ethanol abuse, chronic
pancreatitis, former smoker, asthma

EXAM:
CT ABDOMEN AND PELVIS WITH CONTRAST
TECHNIQUE: Multidetector CT imaging of the abdomen and pelvis was performed
using the standard protocol following bolus administration of
intravenous contrast. Sagittal and coronal MPR images reconstructed
from axial data set.
CONTRAST:  100mL 4QNSMU-ZZZ IOPAMIDOL (4QNSMU-ZZZ) INJECTION 61% IV.
No oral contrast.

[Series 201: routine, idose (2) · axial · 0.95mm/px · z∈[-805,-485]mm · 4 of 97 slices shown, 5 images]
[im 17/97  soft-tissue]
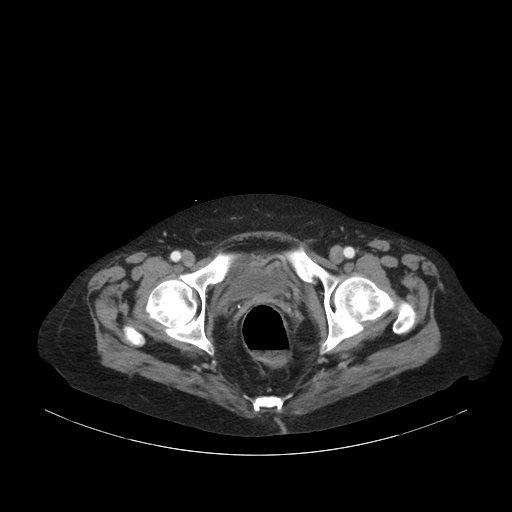
[im 17/97  bone]
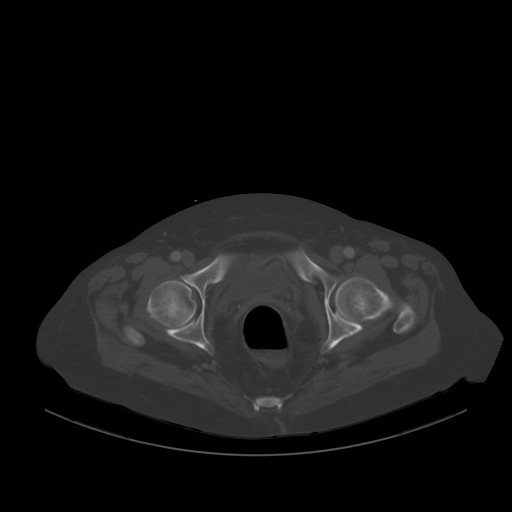
[im 38/97  soft-tissue]
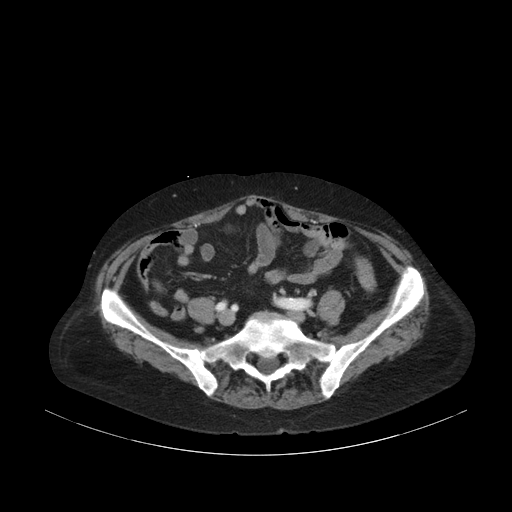
[im 59/97  soft-tissue]
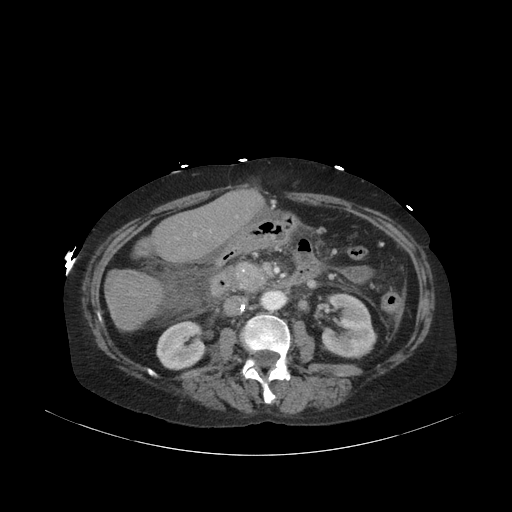
[im 81/97  soft-tissue]
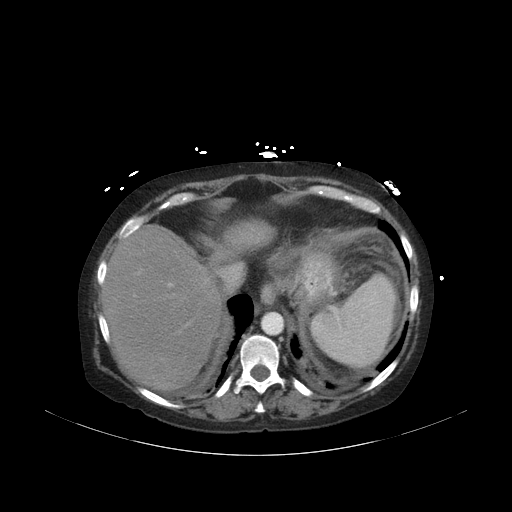

[Series 203: coronals, idose (2) · coronal · 0.45mm/px · 3 of 119 slices shown]
[im 40/119  soft-tissue]
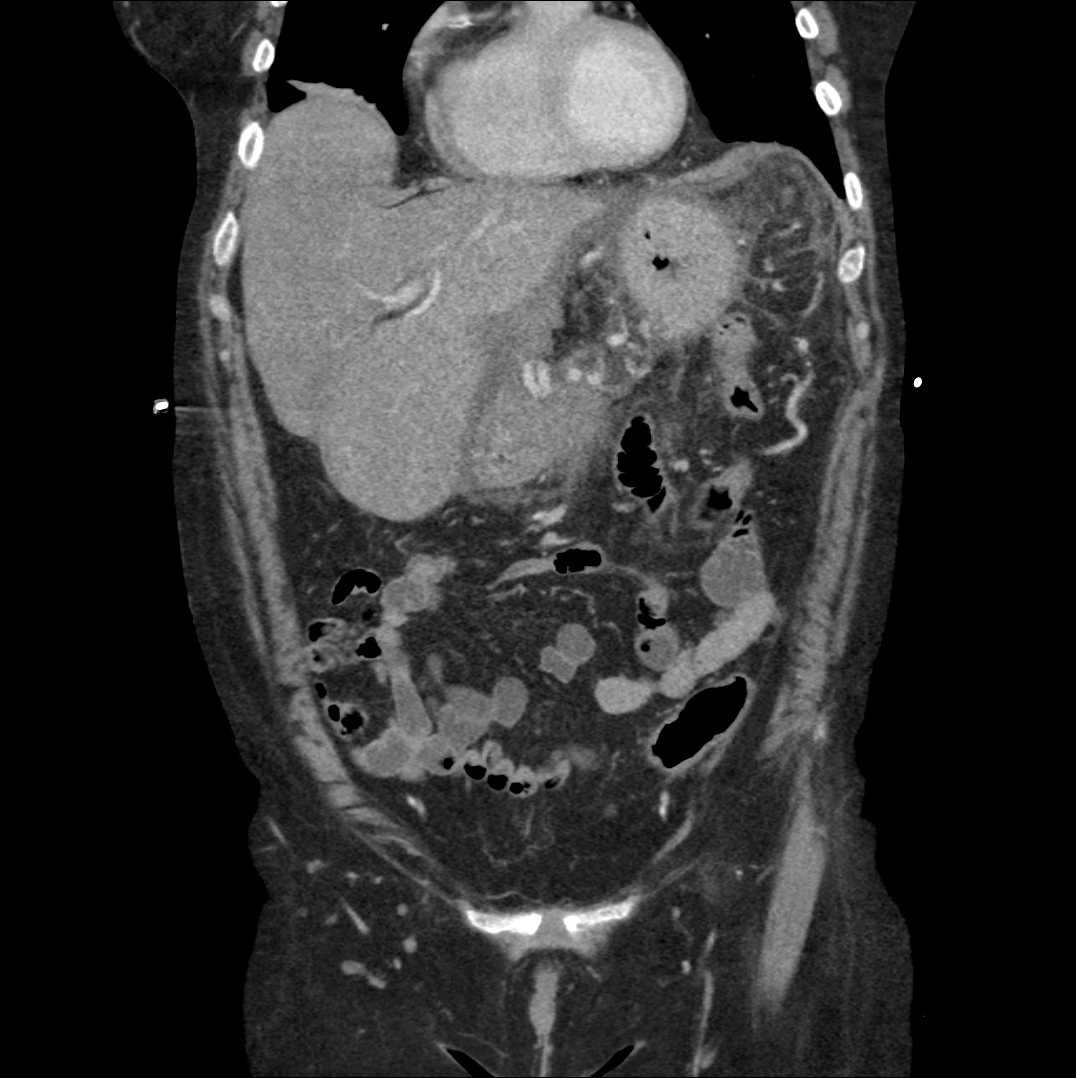
[im 53/119  soft-tissue]
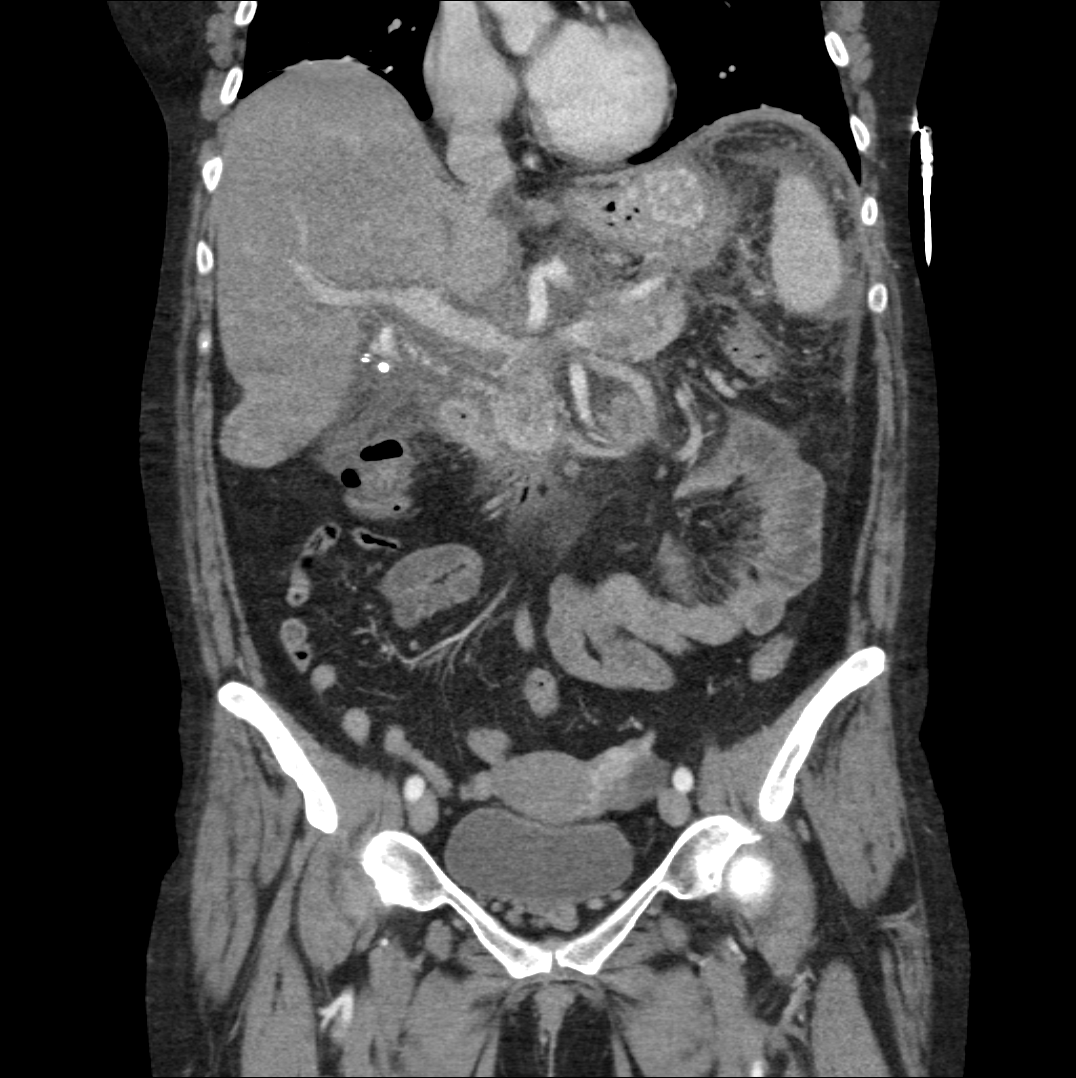
[im 66/119  soft-tissue]
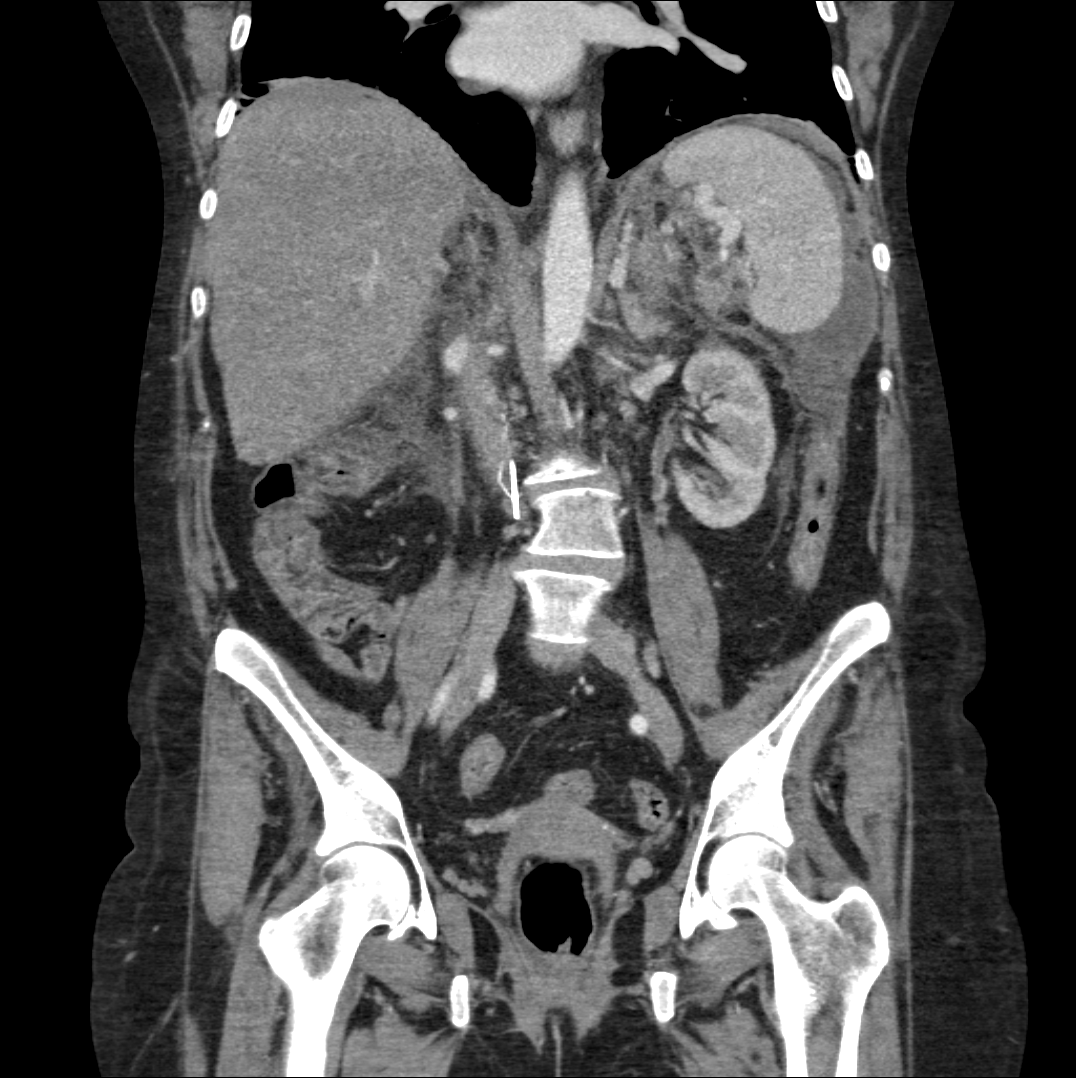

[7 of 46 positions shown; findings below may reference images not displayed]

FINDINGS: Lower chest: Bibasilar atelectasis greater on RIGHT

Hepatobiliary: Probable cirrhotic liver. Gallbladder surgically
absent. Questionable area of low attenuation within the RIGHT lobe,
poorly defined, approximately 3.4 cm in diameter, mass not excluded.

Pancreas: Few scattered calcifications consistent with chronic
calcific pancreatitis. Superimposed acute pancreatitis with
significant peripancreatic edema extending to liver, duodenum,
stomach and spleen. No pancreatic hemorrhage or necrosis. Chronic
thrombosis of the splenic vein and confluence with the superior
mesenteric vein and proximal portal vein. Main portal vein
reconstitutes at the porta hepatis.

Spleen: Normal appearance

Adrenals/Urinary Tract: Calcification centrally in RIGHT adrenal
gland. Adrenal glands otherwise normal appearance. Kidneys, ureters
and bladder normal appearance.

Stomach/Bowel: Segment of transverse colon extends into a ventral
hernia in the epigastrium without evidence of obstruction. Normal
appendix. Stomach decompressed with enhancing collaterals vs.
varices noted at the posterior gastric wall. No definite
paraesophageal varices seen. Small bowel loops unremarkable.

Vascular/Lymphatic: Aorta normal caliber. IVC filter noted. Chronic
thrombosis of the confluence of the splenic, superior mesenteric,
and portal veins with periportal collaterals.

Reproductive: Unremarkable uterus and RIGHT adnexa. Small LEFT
ovarian cyst.

Other: Small amount of perihepatic and perisplenic free fluid. Fluid
adjacent to the lateral margin of the spleen is questionably
loculated, cannot exclude developing pseudocyst 5.1 x 2.4 x 4.1 cm.

Musculoskeletal: No acute osseous findings.
IMPRESSION: Acute on chronic calcific pancreatitis.

Significant peripancreatic edema with questionable developing
pseudocysts lateral to the spleen.

Chronic thrombosis of the confluence of the superior mesenteric,
splenic and portal veins with periportal collaterals and probable
gastric wall varices.

Supraumbilical ventral hernia containing a nonobstructed segment of
transverse colon.

Bibasilar atelectasis greater on RIGHT.

Suspected cirrhotic liver with an area of low attenuation within the
RIGHT lobe at least 3.4 cm in size, cannot exclude mass lesion;
followup non emergent MR imaging of the abdomen with and without
contrast recommended to exclude hepatic neoplasm.

## 2018-09-27 ENCOUNTER — Encounter (INDEPENDENT_AMBULATORY_CARE_PROVIDER_SITE_OTHER): Payer: Self-pay | Admitting: Nurse Practitioner

## 2018-09-27 ENCOUNTER — Ambulatory Visit (INDEPENDENT_AMBULATORY_CARE_PROVIDER_SITE_OTHER): Payer: Self-pay | Admitting: Nurse Practitioner

## 2018-09-27 VITALS — BP 120/80 | HR 83 | Temp 96.9°F

## 2018-09-27 DIAGNOSIS — Z4802 Encounter for removal of sutures: Secondary | ICD-10-CM

## 2018-09-27 DIAGNOSIS — Z4889 Encounter for other specified surgical aftercare: Secondary | ICD-10-CM

## 2018-09-27 DIAGNOSIS — S41101D Unspecified open wound of right upper arm, subsequent encounter: Secondary | ICD-10-CM

## 2018-09-27 NOTE — Progress Notes (Signed)
[facility] Clinic Note - General Postop    Patient Name: Autumn, [name]   DOB: [birth year]  MRN#: [id number]  PCP: Marisa Sprinkles, MD     Date: 09/27/2018     History of Present Illness:   [name] 58 y.o. female is s/p I&D, wound closure to right arm dog bite wound on 5/21 by Dr. Emilio Math. She denies fever, chills. Her appetite has been good. Her activity has returned to normal. She does not require pain medication for the wound. She has no additional complaints.  She reports home health has been caring for her wound and instructed her not to follow up as arranged at discharge because her wound was not ready for suture removal.    Medications:     Outpatient Medications Marked as Taking for the 09/27/18 encounter (Office Visit) with Theotis Burrow, NP   Medication Sig Dispense Refill    albuterol (PROVENTIL HFA;VENTOLIN HFA) 108 (90 Base) MCG/ACT inhaler Inhale 2 puffs into the lungs every 6 (six) hours as needed for Wheezing      amLODIPine (NORVASC) 5 MG tablet Take 1 tablet (5 mg total) by mouth daily 30 tablet 0    furosemide (LASIX) 20 MG tablet Take 1 tablet (20 mg total) by mouth daily as needed (for legs edema or abdominal swelling) 30 tablet 0    ibuprofen (ADVIL,MOTRIN) 200 MG tablet Take 400 mg by mouth every 6 (six) hours as needed for Pain.          Multiple Vitamins-Minerals (MULTIVITAMIN WITH MINERALS) tablet Take 1 tablet by mouth every morning.         naloxone (NARCAN) 4 MG/0.1ML nasal spray 1 spray intranasally. If pt does not respond or relapses into respiratory depression call 911. Give additional doses every 2-3 min. 2 each 0    ondansetron (ZOFRAN-ODT) 4 MG disintegrating tablet Take 1 tablet (4 mg total) by mouth every 8 (eight) hours as needed for Nausea 20 tablet 0    pancrelipase, Lip-Prot-Amyl, (CREON 24000) 24000-76000 units Cap DR Particles Take 72,000 units of lipase by mouth 3 (three) times daily with meals      potassium chloride (K-DUR,KLOR-CON) 20 MEQ tablet  Take 1 tablet (20 mEq total) by mouth 2 (two) times daily. (Patient taking differently: Take 20 mEq by mouth daily   ) 10 tablet 0    sodium bicarbonate 650 MG tablet Take 1 tablet (650 mg total) by mouth 3 (three) times daily 90 tablet 0    spironolactone (ALDACTONE) 50 MG tablet Take 1 tablet (50 mg total) by mouth daily 30 tablet 0    traMADol (ULTRAM) 50 MG tablet Take 1 tablet (50 mg total) by mouth every 6 (six) hours as needed for Pain 15 tablet 0    vitamin B-1 (THIAMINE) 100 MG tablet Take 100 mg by mouth daily         Review of Systems:   As noted above.    Physical Exam:   BP 120/80    Pulse 83    Temp (!) 96.9 F (36.1 C)   General:  in no apparent distress, improved, appears comfortable, non-toxic  Wound at right forearm:  healing, dry skin and scab visible.  sutures mildly difficult to remove as they were slightly imbedded in the skin.  sutures removed by NP, mepilex dressing placed      Assessment:     1. Arm wound, right, subsequent encounter    2. Encounter for postoperative wound check  3. Visit for suture removal       She is doing well without problems.  She is recovering well.    Plan:   Follow-up: Return if symptoms worsen or fail to improve.     Medication Orders:    Additional Orders:  No orders of the defined types were placed in this encounter.       Instructions:  There are no Patient Instructions on file for this visit.      Theotis Burrow, NP  09/27/2018 2:06 PM    The supervising physician for this clinic visit was Dr. Reola Calkins.      I have reviewed the notes, assessments, and/or procedures performed by Cristi Loron  , I concur with her/his documentation of [name].        Terrill Mohr MD  [facility] (Acute Care and Emergency Surgery [facility])  [facility]  Phone # [phone]  Pager # [phone]

## 2018-10-10 ENCOUNTER — Encounter: Payer: BLUE CROSS/BLUE SHIELD | Admitting: Internal Medicine

## 2018-10-14 ENCOUNTER — Inpatient Hospital Stay
Admission: EM | Admit: 2018-10-14 | Discharge: 2018-10-17 | DRG: 282 | Disposition: A | Discharge status: Home / Self Care | Payer: Medicaid Other | Attending: Internal Medicine | Admitting: Internal Medicine

## 2018-10-14 DIAGNOSIS — E872 Acidosis, unspecified: Secondary | ICD-10-CM | POA: Diagnosis present

## 2018-10-14 DIAGNOSIS — D6959 Other secondary thrombocytopenia: Secondary | ICD-10-CM | POA: Diagnosis present

## 2018-10-14 DIAGNOSIS — R109 Unspecified abdominal pain: Secondary | ICD-10-CM

## 2018-10-14 DIAGNOSIS — F10229 Alcohol dependence with intoxication, unspecified: Secondary | ICD-10-CM | POA: Diagnosis present

## 2018-10-14 DIAGNOSIS — D696 Thrombocytopenia, unspecified: Secondary | ICD-10-CM | POA: Diagnosis present

## 2018-10-14 DIAGNOSIS — Z720 Tobacco use: Secondary | ICD-10-CM

## 2018-10-14 DIAGNOSIS — F17210 Nicotine dependence, cigarettes, uncomplicated: Secondary | ICD-10-CM | POA: Diagnosis present

## 2018-10-14 DIAGNOSIS — K860 Alcohol-induced chronic pancreatitis: Secondary | ICD-10-CM | POA: Diagnosis present

## 2018-10-14 DIAGNOSIS — K852 Alcohol induced acute pancreatitis without necrosis or infection: Principal | ICD-10-CM | POA: Diagnosis present

## 2018-10-14 DIAGNOSIS — Z86718 Personal history of other venous thrombosis and embolism: Secondary | ICD-10-CM

## 2018-10-14 DIAGNOSIS — E8729 Other acidosis: Secondary | ICD-10-CM | POA: Diagnosis present

## 2018-10-14 DIAGNOSIS — E869 Volume depletion, unspecified: Secondary | ICD-10-CM | POA: Diagnosis present

## 2018-10-14 DIAGNOSIS — Z90411 Acquired partial absence of pancreas: Secondary | ICD-10-CM

## 2018-10-14 DIAGNOSIS — Z8719 Personal history of other diseases of the digestive system: Secondary | ICD-10-CM

## 2018-10-14 DIAGNOSIS — R112 Nausea with vomiting, unspecified: Secondary | ICD-10-CM

## 2018-10-14 DIAGNOSIS — E162 Hypoglycemia, unspecified: Secondary | ICD-10-CM | POA: Diagnosis present

## 2018-10-14 DIAGNOSIS — Z66 Do not resuscitate: Secondary | ICD-10-CM | POA: Diagnosis present

## 2018-10-14 DIAGNOSIS — I10 Essential (primary) hypertension: Secondary | ICD-10-CM | POA: Diagnosis present

## 2018-10-14 DIAGNOSIS — K219 Gastro-esophageal reflux disease without esophagitis: Secondary | ICD-10-CM | POA: Diagnosis present

## 2018-10-14 DIAGNOSIS — K7030 Alcoholic cirrhosis of liver without ascites: Secondary | ICD-10-CM | POA: Diagnosis present

## 2018-10-14 DIAGNOSIS — Y906 Blood alcohol level of 120-199 mg/100 ml: Secondary | ICD-10-CM | POA: Diagnosis present

## 2018-10-14 DIAGNOSIS — Z885 Allergy status to narcotic agent status: Secondary | ICD-10-CM

## 2018-10-14 DIAGNOSIS — E871 Hypo-osmolality and hyponatremia: Secondary | ICD-10-CM | POA: Diagnosis present

## 2018-10-14 DIAGNOSIS — R1013 Epigastric pain: Secondary | ICD-10-CM

## 2018-10-14 DIAGNOSIS — D649 Anemia, unspecified: Secondary | ICD-10-CM | POA: Diagnosis present

## 2018-10-14 DIAGNOSIS — Z86010 Personal history of colonic polyps: Secondary | ICD-10-CM

## 2018-10-14 DIAGNOSIS — F1010 Alcohol abuse, uncomplicated: Secondary | ICD-10-CM

## 2018-10-14 DIAGNOSIS — J45909 Unspecified asthma, uncomplicated: Secondary | ICD-10-CM | POA: Diagnosis present

## 2018-10-14 DIAGNOSIS — Z95828 Presence of other vascular implants and grafts: Secondary | ICD-10-CM

## 2018-10-14 LAB — I-STAT CG4 ARTERIAL CARTRIDGE
HCO3, ISTAT: <14.1 mMol/L — CL (ref 20.0–29.0)
Lactic Acid I-Stat: 2 mMol/L (ref 0.5–1.9)
PCO2, ISTAT: 28.5 mm Hg — ABNORMAL LOW (ref 35.0–45.0)
PO2, ISTAT: 75 mm Hg (ref 75–100)
Room Number I-Stat: 3540
i-STAT FIO2: 21 %
pH, ISTAT: 7.2 — CL (ref 7.35–7.45)

## 2018-10-14 LAB — COMPREHENSIVE METABOLIC PANEL
ALT: 42 U/L (ref 0–55)
ALT: 36 U/L (ref 0–55)
AST (SGOT): 102 U/L — ABNORMAL HIGH (ref 10–42)
AST (SGOT): 85 U/L — ABNORMAL HIGH (ref 10–42)
Albumin/Globulin Ratio: 0.86 Ratio (ref 0.80–2.00)
Albumin/Globulin Ratio: 0.82 Ratio (ref 0.80–2.00)
Albumin: 4.2 gm/dL (ref 3.5–5.0)
Albumin: 3.6 gm/dL (ref 3.5–5.0)
Alkaline Phosphatase: 342 U/L — ABNORMAL HIGH (ref 40–145)
Alkaline Phosphatase: 314 U/L — ABNORMAL HIGH (ref 40–145)
Anion Gap: 33.7 mMol/L — ABNORMAL HIGH (ref 7.0–18.0)
Anion Gap: 30 mMol/L — ABNORMAL HIGH (ref 7.0–18.0)
BUN / Creatinine Ratio: 14 Ratio (ref 10.0–30.0)
BUN / Creatinine Ratio: 14.6 Ratio (ref 10.0–30.0)
BUN: 16 mg/dL (ref 7–22)
BUN: 15 mg/dL (ref 7–22)
Bilirubin, Total: 1.8 mg/dL — ABNORMAL HIGH (ref 0.1–1.2)
Bilirubin, Total: 1.5 mg/dL — ABNORMAL HIGH (ref 0.1–1.2)
CO2: 6.6 mMol/L — CL (ref 20.0–30.0)
CO2: 8.1 mMol/L — CL (ref 20.0–30.0)
Calcium: 9.5 mg/dL (ref 8.5–10.5)
Calcium: 8.6 mg/dL (ref 8.5–10.5)
Chloride: 94 mMol/L — ABNORMAL LOW (ref 98–110)
Chloride: 96 mMol/L — ABNORMAL LOW (ref 98–110)
Creatinine: 1.14 mg/dL (ref 0.60–1.20)
Creatinine: 1.03 mg/dL (ref 0.60–1.20)
EGFR: 53 mL/min/{1.73_m2} — ABNORMAL LOW (ref 60–150)
EGFR: 60 mL/min/{1.73_m2} (ref 60–150)
Globulin: 4.9 gm/dL — ABNORMAL HIGH (ref 2.0–4.0)
Globulin: 4.4 gm/dL — ABNORMAL HIGH (ref 2.0–4.0)
Glucose: 47 mg/dL — ABNORMAL LOW (ref 71–99)
Glucose: 95 mg/dL (ref 71–99)
Osmolality Calculated: 259 mOsm/kg — ABNORMAL LOW (ref 275–300)
Osmolality Calculated: 261 mOsm/kg — ABNORMAL LOW (ref 275–300)
Potassium: 4.3 mMol/L (ref 3.5–5.3)
Potassium: 4.1 mMol/L (ref 3.5–5.3)
Protein, Total: 9.1 gm/dL — ABNORMAL HIGH (ref 6.0–8.3)
Protein, Total: 8 gm/dL (ref 6.0–8.3)
Sodium: 130 mMol/L — ABNORMAL LOW (ref 136–147)
Sodium: 130 mMol/L — ABNORMAL LOW (ref 136–147)

## 2018-10-14 LAB — CBC AND DIFFERENTIAL
Basophils %: 0.3 % (ref 0.0–3.0)
Basophils %: 0.2 % (ref 0.0–3.0)
Basophils Absolute: 0 10*3/uL (ref 0.0–0.3)
Basophils Absolute: 0 10*3/uL (ref 0.0–0.3)
Eosinophils %: 0.1 % (ref 0.0–7.0)
Eosinophils %: 0.4 % (ref 0.0–7.0)
Eosinophils Absolute: 0 10*3/uL (ref 0.0–0.8)
Eosinophils Absolute: 0 10*3/uL (ref 0.0–0.8)
Hematocrit: 40.2 % (ref 36.0–48.0)
Hematocrit: 36.9 % (ref 36.0–48.0)
Hemoglobin: 13.5 gm/dL (ref 12.0–16.0)
Hemoglobin: 12.6 gm/dL (ref 12.0–16.0)
Lymphocytes Absolute: 1.1 10*3/uL (ref 0.6–5.1)
Lymphocytes Absolute: 0.6 10*3/uL (ref 0.6–5.1)
Lymphocytes: 8.2 % — ABNORMAL LOW (ref 15.0–46.0)
Lymphocytes: 5.3 % — ABNORMAL LOW (ref 15.0–46.0)
MCH: 34 pg (ref 28–35)
MCH: 35 pg (ref 28–35)
MCHC: 34 gm/dL (ref 32–36)
MCHC: 34 gm/dL (ref 32–36)
MCV: 102 fL — ABNORMAL HIGH (ref 80–100)
MCV: 102 fL — ABNORMAL HIGH (ref 80–100)
MPV: 7.9 fL (ref 6.0–10.0)
MPV: 7.3 fL (ref 6.0–10.0)
Monocytes Absolute: 0.7 10*3/uL (ref 0.1–1.7)
Monocytes Absolute: 0.7 10*3/uL (ref 0.1–1.7)
Monocytes: 5.2 % (ref 3.0–15.0)
Monocytes: 6.3 % (ref 3.0–15.0)
Neutrophils %: 86.1 % — ABNORMAL HIGH (ref 42.0–78.0)
Neutrophils %: 87.8 % — ABNORMAL HIGH (ref 42.0–78.0)
Neutrophils Absolute: 11.2 10*3/uL — ABNORMAL HIGH (ref 1.7–8.6)
Neutrophils Absolute: 9.5 10*3/uL — ABNORMAL HIGH (ref 1.7–8.6)
PLT CT: 130 10*3/uL (ref 130–440)
PLT CT: 104 10*3/uL — ABNORMAL LOW (ref 130–440)
RBC: 3.95 10*6/uL (ref 3.80–5.00)
RBC: 3.62 10*6/uL — ABNORMAL LOW (ref 3.80–5.00)
RDW: 14.7 % — ABNORMAL HIGH (ref 11.0–14.0)
RDW: 14.8 % — ABNORMAL HIGH (ref 11.0–14.0)
WBC: 13 10*3/uL — ABNORMAL HIGH (ref 4.0–11.0)
WBC: 10.8 10*3/uL (ref 4.0–11.0)

## 2018-10-14 LAB — VH DEXTROSE STICK GLUCOSE
Glucose POCT: 105 mg/dL — ABNORMAL HIGH (ref 71–99)
Glucose POCT: 113 mg/dL — ABNORMAL HIGH (ref 71–99)
Glucose POCT: 137 mg/dL — ABNORMAL HIGH (ref 71–99)
Glucose POCT: 178 mg/dL — ABNORMAL HIGH (ref 71–99)
Glucose POCT: 48 mg/dL — ABNORMAL LOW (ref 71–99)

## 2018-10-14 LAB — LIPASE: Lipase: 1771 U/L — ABNORMAL HIGH (ref 8–78)

## 2018-10-14 LAB — MAGNESIUM: Magnesium: 1.4 mg/dL — ABNORMAL LOW (ref 1.6–2.6)

## 2018-10-14 LAB — OSMOLALITY: Osmolality Measured: 315 mOsm/kg — ABNORMAL HIGH (ref 275–295)

## 2018-10-14 LAB — PT/INR
PT INR: 1.1 (ref 0.5–1.3)
PT: 11.5 s (ref 9.5–11.5)

## 2018-10-14 LAB — ETHANOL: Alcohol: 168 mg/dL — ABNORMAL HIGH (ref 0–9)

## 2018-10-14 LAB — OSMOLALITY, URINE: Urine Osmolality: 503 mOsm/kg (ref 301–1093)

## 2018-10-14 LAB — VH VENOUS BLOOD GAS
% O2 Sat, Ven: 83.9 % — ABNORMAL HIGH (ref 40.0–70.0)
HCO3, Ven: 11 mEq/L — CL (ref 20.0–29.0)
pCO2, Venous: 29.3 mm Hg — ABNORMAL LOW (ref 39.0–52.0)
pH, Ven: 7.18 pH — CL (ref 7.32–7.42)
pO2, Venous: 60 mm Hg — ABNORMAL HIGH (ref 25–35)

## 2018-10-14 LAB — PHOSPHORUS: Phosphorus: 3.7 mg/dL (ref 2.3–4.7)

## 2018-10-14 LAB — BETA-HYDROXYBUTYRATE: Betahydroxybutyrate: 10.02 mMol/L (ref 0.02–0.27)

## 2018-10-14 LAB — HEMOGLOBIN A1C: Hgb A1C, %: 5 %

## 2018-10-14 MED ORDER — SODIUM CHLORIDE (PF) 0.9 % IJ SOLN
10.00 mL | INTRAMUSCULAR | Status: DC | PRN
Start: 2018-10-14 — End: 2018-10-17

## 2018-10-14 MED ORDER — HYDROMORPHONE HCL 0.5 MG/0.5 ML IJ SOLN
INTRAMUSCULAR | Status: AC
Start: 2018-10-14 — End: ?
  Filled 2018-10-14: qty 1

## 2018-10-14 MED ORDER — VH HEPARIN SODIUM (PORCINE) 5000 UNIT/ML IJ SOLN
5000.00 [IU] | Freq: Three times a day (TID) | INTRAMUSCULAR | Status: DC
Start: 2018-10-14 — End: 2018-10-14

## 2018-10-14 MED ORDER — VH ELECTROLYTE REPLACEMENT PROTOCOL PLACEHOLDER
Status: DC | PRN
Start: 2018-10-14 — End: 2018-10-15
  Filled 2018-10-14: qty 1

## 2018-10-14 MED ORDER — HYDROMORPHONE HCL 0.5 MG/0.5 ML IJ SOLN
1.00 mg | Freq: Once | INTRAMUSCULAR | Status: AC
Start: 2018-10-14 — End: 2018-10-14
  Administered 2018-10-14: 1 mg via INTRAVENOUS

## 2018-10-14 MED ORDER — DEXTROSE 10 % IV BOLUS
125.00 mL | INTRAVENOUS | Status: DC | PRN
Start: 2018-10-14 — End: 2018-10-17
  Administered 2018-10-14: 19:00:00 125 mL via INTRAVENOUS

## 2018-10-14 MED ORDER — CEROVITE ADVANCED FORMULA PO TABS
1.0000 | ORAL_TABLET | Freq: Every day | ORAL | Status: DC
Start: 2018-10-14 — End: 2018-10-15
  Administered 2018-10-14 – 2018-10-15 (×2): 1 via ORAL
  Filled 2018-10-14 (×2): qty 1

## 2018-10-14 MED ORDER — HYDRALAZINE HCL 20 MG/ML IJ SOLN
10.00 mg | Freq: Four times a day (QID) | INTRAMUSCULAR | Status: DC | PRN
Start: 2018-10-14 — End: 2018-10-17
  Administered 2018-10-14: 23:00:00 10 mg via INTRAVENOUS
  Filled 2018-10-14: qty 1

## 2018-10-14 MED ORDER — ALBUTEROL SULFATE (2.5 MG/3ML) 0.083% IN NEBU
2.50 mg | INHALATION_SOLUTION | Freq: Four times a day (QID) | RESPIRATORY_TRACT | Status: DC | PRN
Start: 2018-10-14 — End: 2018-10-17
  Administered 2018-10-16: 2.5 mg via RESPIRATORY_TRACT
  Filled 2018-10-14: qty 3

## 2018-10-14 MED ORDER — SODIUM CHLORIDE 0.9 % IV SOLN
100.00 mg | Freq: Once | INTRAVENOUS | Status: AC
Start: 2018-10-14 — End: 2018-10-14
  Administered 2018-10-14: 21:00:00 100 mg via INTRAVENOUS
  Filled 2018-10-14: qty 1

## 2018-10-14 MED ORDER — DEXTROSE 10 % IV SOLN
INTRAVENOUS | Status: AC
Start: 2018-10-14 — End: ?
  Filled 2018-10-14: qty 250

## 2018-10-14 MED ORDER — SODIUM CHLORIDE 0.9 % IV BOLUS
500.00 mL | Freq: Once | INTRAVENOUS | Status: AC
Start: 2018-10-14 — End: 2018-10-14
  Administered 2018-10-14: 17:00:00 500 mL via INTRAVENOUS

## 2018-10-14 MED ORDER — HYDROMORPHONE HCL 0.5 MG/0.5 ML IJ SOLN
1.0000 mg | INTRAMUSCULAR | Status: AC | PRN
Start: 2018-10-14 — End: 2018-10-14
  Administered 2018-10-14 (×3): 1 mg via INTRAVENOUS
  Filled 2018-10-14 (×2): qty 1

## 2018-10-14 MED ORDER — VH MAGNESIUM SULFATE 4 G IN 100 ML IV PREMIX
4.00 g | Freq: Once | INTRAVENOUS | Status: AC
Start: 2018-10-14 — End: 2018-10-15
  Administered 2018-10-14: 23:00:00 4 g via INTRAVENOUS
  Filled 2018-10-14: qty 100

## 2018-10-14 MED ORDER — SODIUM CHLORIDE 0.9 % IV SOLN
260.00 mg | Freq: Once | INTRAVENOUS | Status: AC
Start: 2018-10-14 — End: 2018-10-14
  Administered 2018-10-14: 20:00:00 260 mg via INTRAVENOUS
  Filled 2018-10-14: qty 2

## 2018-10-14 MED ORDER — MIDAZOLAM HCL 1 MG/ML IJ SOLN (WRAP)
2.00 mg | Freq: Once | INTRAMUSCULAR | Status: AC | PRN
Start: 2018-10-14 — End: 2018-10-15
  Administered 2018-10-15: 2 mg via INTRAVENOUS

## 2018-10-14 MED ORDER — ENOXAPARIN SODIUM 40 MG/0.4ML SC SOLN
40.00 mg | SUBCUTANEOUS | Status: DC
Start: 2018-10-14 — End: 2018-10-17
  Filled 2018-10-14 (×4): qty 0.4

## 2018-10-14 MED ORDER — SODIUM BICARBONATE 8.4 % IV SOLN
250.00 mL/h | INTRAVENOUS | Status: DC
Start: 2018-10-14 — End: 2018-10-15
  Administered 2018-10-14: 21:00:00 250 mL/h via INTRAVENOUS
  Filled 2018-10-14: qty 1000

## 2018-10-14 MED ORDER — SODIUM CHLORIDE 0.9 % IV BOLUS
1000.00 mL | Freq: Once | INTRAVENOUS | Status: AC
Start: 2018-10-14 — End: 2018-10-14
  Administered 2018-10-14: 21:00:00 1000 mL via INTRAVENOUS

## 2018-10-14 MED ORDER — VH MIDAZOLAM INFUSION 100 MG/100 ML NS (SIMPLE)
1.00 mg/h | INTRAVENOUS | Status: DC | PRN
Start: 2018-10-14 — End: 2018-10-15
  Filled 2018-10-14: qty 100

## 2018-10-14 MED ORDER — LACTATED RINGERS IV BOLUS
1000.00 mL | Freq: Once | INTRAVENOUS | Status: AC
Start: 2018-10-14 — End: 2018-10-14
  Administered 2018-10-14: 18:00:00 1000 mL via INTRAVENOUS

## 2018-10-14 MED ORDER — VH NURSING CARE MEDICATION PROTOCOL PLACEHOLDER
1.00 | Status: DC
Start: 2018-10-14 — End: 2018-10-17

## 2018-10-14 MED ORDER — ONDANSETRON HCL 4 MG/2ML IJ SOLN
INTRAMUSCULAR | Status: AC
Start: 2018-10-14 — End: ?
  Filled 2018-10-14: qty 2

## 2018-10-14 MED ORDER — VH VITAMIN B-1 100 MG PO TABS (WRAP)
100.0000 mg | ORAL_TABLET | Freq: Every day | ORAL | Status: DC
Start: 2018-10-14 — End: 2018-10-17
  Administered 2018-10-15 – 2018-10-17 (×3): 100 mg via ORAL
  Filled 2018-10-14 (×4): qty 1

## 2018-10-14 MED ORDER — DEXTROSE-NACL 5-0.9 % IV SOLN
INTRAVENOUS | Status: DC
Start: 2018-10-14 — End: 2018-10-15

## 2018-10-14 MED ORDER — HYDROMORPHONE HCL 0.5 MG/0.5 ML IJ SOLN
1.00 mg | INTRAMUSCULAR | Status: DC | PRN
Start: 2018-10-14 — End: 2018-10-15
  Administered 2018-10-14: 1 mg via INTRAVENOUS
  Filled 2018-10-14: qty 1

## 2018-10-14 MED ORDER — PHENOBARBITAL SODIUM 65 MG/ML IJ SOLN
65.00 mg | Freq: Three times a day (TID) | INTRAMUSCULAR | Status: DC
Start: 2018-10-15 — End: 2018-10-15
  Administered 2018-10-15 (×2): 65 mg via INTRAVENOUS
  Filled 2018-10-14 (×2): qty 1

## 2018-10-14 MED ORDER — SODIUM CHLORIDE (PF) 0.9 % IJ SOLN
3.00 mL | Freq: Three times a day (TID) | INTRAMUSCULAR | Status: DC
Start: 2018-10-14 — End: 2018-10-17
  Administered 2018-10-14 – 2018-10-15 (×2): 3 mL via INTRAVENOUS

## 2018-10-14 MED ORDER — INSULIN LISPRO (1 UNIT DIAL) 100 UNIT/ML SC SOPN
1.00 [IU] | PEN_INJECTOR | SUBCUTANEOUS | Status: DC
Start: 2018-10-14 — End: 2018-10-15
  Administered 2018-10-15: 12:00:00 1 [IU] via SUBCUTANEOUS
  Administered 2018-10-15: 03:00:00 3 [IU] via SUBCUTANEOUS
  Administered 2018-10-15: 09:00:00 1 [IU] via SUBCUTANEOUS
  Filled 2018-10-14: qty 3

## 2018-10-14 MED ORDER — GLUCAGON 1 MG IJ SOLR (WRAP)
1.00 mg | INTRAMUSCULAR | Status: DC | PRN
Start: 2018-10-14 — End: 2018-10-17

## 2018-10-14 MED ORDER — ONDANSETRON HCL 4 MG/2ML IJ SOLN
4.00 mg | Freq: Once | INTRAMUSCULAR | Status: AC
Start: 2018-10-14 — End: 2018-10-14
  Administered 2018-10-14: 18:00:00 4 mg via INTRAVENOUS

## 2018-10-14 MED ORDER — SODIUM BICARBONATE 8.4 % IV SOLN
250.00 mL/h | INTRAVENOUS | Status: DC
Start: 2018-10-14 — End: 2018-10-14

## 2018-10-14 MED ORDER — FOLIC ACID 1 MG PO TABS
1.0000 mg | ORAL_TABLET | Freq: Every day | ORAL | Status: DC
Start: 2018-10-14 — End: 2018-10-15
  Administered 2018-10-14 – 2018-10-15 (×2): 1 mg via ORAL
  Filled 2018-10-14 (×2): qty 1

## 2018-10-14 MED ORDER — MIDAZOLAM HCL 1 MG/ML IJ SOLN (WRAP)
3.00 mg | INTRAMUSCULAR | Status: DC | PRN
Start: 2018-10-14 — End: 2018-10-15
  Administered 2018-10-15 (×2): 3 mg via INTRAVENOUS
  Filled 2018-10-14 (×2): qty 5

## 2018-10-14 MED ORDER — SODIUM CHLORIDE (PF) 0.9 % IJ SOLN
10.00 mL | Freq: Two times a day (BID) | INTRAMUSCULAR | Status: DC
Start: 2018-10-15 — End: 2018-10-17
  Administered 2018-10-15 – 2018-10-17 (×5): 10 mL

## 2018-10-14 MED ORDER — ONDANSETRON HCL 4 MG/2ML IJ SOLN
4.00 mg | Freq: Four times a day (QID) | INTRAMUSCULAR | Status: DC | PRN
Start: 2018-10-14 — End: 2018-10-15
  Administered 2018-10-14: 21:00:00 4 mg via INTRAVENOUS
  Filled 2018-10-14: qty 2

## 2018-10-14 MED ORDER — SODIUM CHLORIDE 0.9 % IV SOLN
INTRAVENOUS | Status: DC
Start: 2018-10-14 — End: 2018-10-14

## 2018-10-14 NOTE — Progress Notes (Addendum)
NURSE NOTE SUMMARY  [facility] - CRITICAL CARE 4   Patient Name: [name]   Attending Physician: Rosezella Florida*   Today's date:   10/14/2018 LOS: 0 days   Shift Summary:                                                              2000: assumed care of pt at this time. States pain is 10/10 abd and back. Pain medication administered per order. Will monitor.     0000: pt with tremors, states feeling anxious/agitated. Scored 8 on MINDS protocol. PRN versed administered per order.     0200: aICU called, pt remains painful. Too early for next dose of pain medication. New orders recived.   0300: finger check 298. Sliding scale used and covered. aICU called, pt has maintenance fluid D5NS going at 250. New orders received.        Provider Notifications:      Rapid Response Notifications:  Mobility:          Weight tracking:  Family Dynamic:   Last 3 Weights for the past 72 hrs (Last 3 readings):   Weight   10/14/18 1959 68.1 kg (150 lb 2.1 oz)   10/14/18 1843 68 kg (150 lb)             Recent Vitals Last Bowel Movement   BP: (!) 155/101 (10/14/2018  9:15 PM)  Heart Rate: 96 (10/14/2018  9:15 PM)  Temp: 97.6 F (36.4 C) (10/14/2018  7:59 PM)  Resp Rate: 15 (10/14/2018  9:15 PM)  Height: 1.676 m (5\' 6" ) (10/14/2018  7:59 PM)  Weight: 68.1 kg (150 lb 2.1 oz) (10/14/2018  7:59 PM)  SpO2: 92 % (10/14/2018  9:15 PM)   No data recorded

## 2018-10-14 NOTE — ED Provider Notes (Signed)
Clinical Impression  1. Alcohol-induced acute pancreatitis without infection or necrosis    2. Metabolic acidosis with increased anion gap and accumulation of organic acids    3. Intractable vomiting with nausea, unspecified vomiting type    4. ETOH abuse    5. Alcoholic ketoacidosis    6. Chronic alcoholic pancreatitis    7. Tobacco use          The patient presents to the Emergency Department with abdominal pain recurrent n/v and acute pancreatitis with metabolic gap acidosis. Treatment has been initiated in the ER, but the patient has not had significant improvement in symptoms, appears ill enough and/or has illness/findings/co-morbidities that make admission for IV medications, possible surgical consult, and further management the most appropriate disposition. Differential diagnosis has included but is not limited to appendicitis, gall bladder disease, bowel obstruction, colitis, gastroenteritis, urinary tract obstruction, AAA, pancreatitic and hepatitis.  Diagnostic impression and plan were discussed and agreed upon with the patient and/or family.  Results of lab/radiology tests were reviewed and discussed with the patient and/or family. All questions were answered and concerns addressed.  Appropriate consultation was made for admission and further treatment of this patient.              Disposition:  ED Disposition     ED Disposition Condition Date/Time Comment    Admit  Mon Oct 14, 2018  5:38 PM           [name]  10/15/2018 6:09 PM        Chief Complaint   Patient presents with    Abdominal Pain        HPI  Pt is a 58 yo c/o severe abd pain from her pancreatitis r/t alcohol over 4th of July with recurrent n/v/inability to tolerate po. No fevers, chills, has a chronic cough. Only vodka. No atypical features. Pain 8/10 with some radiation to back for days now. No allev factors. Denies other toxic alcohols or visual disturbances.   History:   Past Medical History:   Diagnosis Date    Ascites      Asthma without status asthmaticus     Clostridium difficile colitis     Disorder of liver     multifocal cystic irregularities    DVT (deep venous thrombosis)     Gastroesophageal reflux disease     H/O ETOH abuse     sober 2 years until 03/2014    Hypertension     Hypokalemia     Pancreatitis     S/P IVC filter      Past Surgical History:   Procedure Laterality Date    CHOLECYSTECTOMY      COLONOSCOPY  12/26/2012    Procedure: COLONOSCOPY;  Surgeon: Gwenith Spitz, MD;  Location: [facility];  Service: Gastroenterology;  Laterality: N/A;    COLONOSCOPY, POLYPECTOMY  12/26/2012    Procedure: COLONOSCOPY, POLYPECTOMY;  Surgeon: Gwenith Spitz, MD;  Location: [facility];  Service: Gastroenterology;  Laterality: N/A;    DEBRIDEMENT & IRRIGATION, WOUND CLOSURE Right 08/29/2018    Procedure: WASHOUT AND CLOSURE OF TRAUMATIC WOUND, RIGHT FOREARM;  Surgeon: Arnoldo Hooker, MD;  Location: [facility];  Service: General;  Laterality: Right;  Right forearm I&D poss. wound closure    PANCREATECTOMY  01/2011    partial    THORACENTESIS Right 2012    pl effusion     Family History   Adopted: Yes       Social History  Tobacco Use    Smoking status: Current Every Day Smoker     Packs/day: 0.50     Years: 30.00     Pack years: 15.00     Types: Cigarettes    Smokeless tobacco: Never Used   Substance Use Topics    Alcohol use: Yes     Comment: occasional    Drug use: No       Review of Systems   Constitutional: Negative.    HENT: Negative.    Respiratory: Negative.    Cardiovascular: Negative.    Gastrointestinal: Positive for abdominal pain, nausea and vomiting. Negative for diarrhea.   Genitourinary: Positive for decreased urine volume. Negative for difficulty urinating and hematuria.   Musculoskeletal: Negative.    Skin: Negative.    Neurological: Negative.        ED Triage Vitals [10/14/18 1449]   Enc Vitals Group      BP (!) 175/109      Heart Rate (!) 103      Resp Rate 20      Temp 98.5 F  (36.9 C)      Temp Source Skin      SpO2 95 %      Weight       Height 1.651 m      Head Circumference       Peak Flow       Pain Score 10      Pain Loc       Pain Edu?       Excl. in GC?        Physical Exam  Vitals signs and nursing note reviewed.   Constitutional:       General: She is not in acute distress.     Appearance: Normal appearance. She is well-developed. She is not ill-appearing or toxic-appearing.   HENT:      Mouth/Throat:      Mouth: Mucous membranes are moist.      Pharynx: Oropharynx is clear.   Eyes:      General: No scleral icterus.     Conjunctiva/sclera: Conjunctivae normal.   Cardiovascular:      Rate and Rhythm: Normal rate and regular rhythm.      Heart sounds: No murmur.   Pulmonary:      Effort: Pulmonary effort is normal.      Breath sounds: Normal breath sounds.   Abdominal:      General: Abdomen is protuberant. Bowel sounds are decreased. There is no distension.      Palpations: Abdomen is soft.      Tenderness: There is abdominal tenderness in the epigastric area. There is no guarding or rebound.   Musculoskeletal:         General: No signs of injury.      Right lower leg: No edema.      Left lower leg: No edema.   Skin:     General: Skin is warm and dry.      Capillary Refill: Capillary refill takes less than 2 seconds.      Coloration: Skin is not cyanotic or jaundiced.      Findings: No rash.   Neurological:      General: No focal deficit present.      Mental Status: She is alert and oriented to person, place, and time.         ED Course as of Oct 15 1807   Mon Oct 14, 2018   1657 Lipase(!): 1,771 [GM]  1657 CO2(!!): 6.6 [GM]   1658 30 ag     [GM]   1736 Alcohol(!): 168 [GM]      ED Course User Index  [GM] Autumn Casey, MD       Reviewed patient's previous medical records including but not limited to [facility] previous discharge summary, [facility] previous laboratory studies and [facility] previous ED visits    Reviewed nursing notes, updated vitals/assessments.        Procedures    Review previous pertinent visits, Review previous pertinent diagnostics and Discussion with admitting specialist       Autumn Casey, MD  10/15/18 [phone]

## 2018-10-14 NOTE — ED Triage Notes (Signed)
Patient presents with back pain, nausea and vomiting.

## 2018-10-14 NOTE — ED Notes (Signed)
Bed: N1-A  Expected date:   Expected time:   Means of arrival:   Comments:  RAU-C

## 2018-10-14 NOTE — Procedures (Signed)
BARD PowerGlide Midline:         [name],[name]  10/14/2018    Ultrasound was used to confirm patency of the Left Brachial vein prior to obtaining venous access. The area to be punctured was cleansed with chlorhexidine 2% for more than 30 seconds and the site draped using fenestrated drape using sterile precautions.    A sterile cover was sheathed to the Ultrasound probe. The vessel was then revisualized prior to puncture of the vessel with a BARD PowerGlide needle under direct sonographic guidance and inserted per manufacturer guidelines. The catheter was flushed with normal saline to confirm brisk blood return  via capped extension tubing. The catheter was stabilized on the skin using a securement device and a sterile transparent occlusive dressing applied using sterile technique.    Patient did tolerate procedure well.       Insertion Site (vein): Left Brachial  Catheter Type: Powerglide   Catheter Gauge: 20 G  Total Length: 10 cm  Powerglide Kit Lot #: [id number]    Powerglide inserted by: Curtis Sites, RN    FINDINGS/CONCLUSIONS:   No signs or symptoms of related complications.      Powerglide is ready for use  Powerglide education / instructions provided to primary RN.      Powerglide Care:     Dressing Changes   A Sterile dressing change is indicated at least once every seven (7)  days or if otherwise indicated.     Assess the dressing more frequently in the first 24 hours for accumulation of blood, fluid, or moisture beneath the dressing.      Periodically confirm catheter placement, patency, and security of dressing.     Securement Device  Statlock Stabilization Device must remain in place for the duration of the catheter and to be replaced at least once every seven (7) days in accordance with a sterile dressing change.    Extension Set/Cap Change  This institution uses extension sets with a non-removable cap. Thus, entire extension piece must be changed at least once every four (4) days or 96  hours  in accordance with policy regarding changing caps.    Flushing   Administer a brisk pulsatile flush using a 10mL syringe of 0.9% normal saline at least once every twelve (12) hours or before/after catheter use.      Heparin flush is not indicated in the maintenance of the catheter.     Do not flush against resistance.     Blood return is often better with a larger gauge catheter, but is NOT GUARANTEED.      It depends on a patients vasculature, the gauge of the catheter placed, and the effects of medications on the vessel.     Cath-flo (Activase) declotting procedure is not indicated in a Powerglide. If it is determined the catheter is occluded with blood, the catheter is to be removed.      Troubleshooting (Mechanical Occlusion)    If unable to aspirate or flush catheter it is likely related to mechanical occlusion.      Gently pull back on extension set and attempt aspiration simultaneously.  If able to aspirate while pulling back, a dressing change is indicated as the catheter is most likely kinked at the insertion site.      This may have occurred due to patient movement, body habitus, or due to improper taping/ dressing application.      Ensure Powerglide hub is properly secured in StatLock as they are designed to keep  hub at an angle to prevent kinking at the insertion site. Do not tape the catheter hub flat to the skin or tape the extension set in a way that will tip the hub down.

## 2018-10-14 NOTE — ED Notes (Signed)
Pt updated on plan of care and inpatient bed assignment. Pt reassessed, no acute or worsening findings noted. Pt is A&O x4. No needs expressed at this time. Report called to receiving RN. Pt to be transported on the monitor with RN and EDT.

## 2018-10-14 NOTE — ED Notes (Signed)
Attempted IV in L AC x1, unsuccessful. Obtained blood work.

## 2018-10-14 NOTE — H&P (Signed)
[facility]  Admission Note    Patient Name: [name]    Attending Physician: Dimple Casey, MD                                             LOS:  0 DAYS   Primary Care Physician: Pcp, None, MD            ROOM#: RAUC/RAU-C                                  Assessment    58 year old female history of chronic pancreatitis, alcoholic cirrhosis, chronic alcoholism admitted with acute alcoholic induced pancreatitis, acute alcohol intoxication and alcoholic ketoacidosis with profound hypoglycemia.    Interval Events / ICU Course    7/6 admitted to ICU for acute alcohol induced pancreatitis, acute alcohol intoxication and alcoholic ketoacidosis with profound hypoglycemia    Assessment and Plan:                                                           Acute alcohol-induced pancreatitis  -N.p.o.  -Aggressive IV fluid hydration  --Received 1 L of IV crystalloid.  Additional 1 L ordered  -Continue fluid resuscitation with 0.9%NS at 250 mL/h after 1 L of D5W  with 150 mEq of sodium bicarbonate infusion at 250 mL/h  -Pain control  -Zofran as needed for nausea  -Recheck lipase in a.m.    Metabolic acidosis with increased anion gap  -History of chronic acidosis maintained on oral sodium bicarbonate as an outpatient  -Recent intractable vomiting x2 days  -anion gap most likely secondary to alcoholic ketoacidosis with elevation of beta hydroxybutyrate  -Check ABG with LA  -Sodium bicarbonate infusion and dextrose solution x1 L  -Closely monitor electrolytes    Hypoglycemia  -Most likely secondary to alcoholic ketoacidosis  -Hypoglycemia protocol  -Start dextrose infusion as above with serum bicarbonate  -Fingersticks/SSI every hour until stable    Acute alcohol intoxication in the setting of chronic alcoholism with liver cirrhosis  -CIWA protocol  -Start phenobarbital protocol with load and maintenance dosing  -Seizure/aspiration precautions  -Thiamine folate and multivitamin  -Check INR.  Lately stable  at 130    History of asthma  -PRN albuterol     Essential hypertension  -PRN hydralazine    General ICU Assessment / Plans - if applicable    Vascular access: Peripherals.      GI Prophylaxis: None needed.  Nutrition: NPO.              Sedation: None needed.  Foley Catheter: not needed  VTE Prophylaxis:   SCDs/Pneumatic Stockings/Lovenox    Other:  Code Status: NO CPR - SUPPORT OK  Disposition: Admit to ICU        HISTORY / Subjective    58 year old female history of chronic alcoholism, alcoholic cirrhosis, chronic pancreatitis, hypertension, alcohol dependence and chronic metabolic acidosis who presents to the emergency department for complaint of abdominal pain.  She relates her severe abdominal pain to her chronic pancreatitis after ingestion of alcohol on 4 July with recurrent nausea vomiting and inability to tolerate oral intake.  She describes pain as 8 out of 10 with some radiation to her back.  She has had no fever or chills.  Her last meal was on 12 October 2018. She states that her last alcohol intake was on 13 October 2018 (vodka).  She states that she usually experiences signs of early alcohol withdrawal approximately 24 hours after her last drink.    ED work-up:  WBC 13 hemoglobin 13.5 Mar[place] 40.2 platelets 130 glucose 47 beta hydroxybutyrate 10.0 BUN 16 creatinine 1.1 sodium 130 potassium 4.3 chloride 94 CO2 6.6 calcium 9.5 anion gap 33.7 EGFR 53 AST 182 ALT 42 alkaline phosphatase 342 total protein 9.1 total bili 1.8 lipase 1771 alcohol level 168 VBG 7.1 12/07/1958 O2 saturation 84%    She received 1 L of IV crystalloid  She received 125 mL of D10  She received a total of 2 mg of IV Dilaudid  She received 4 mg of Zofran        PAST MEDICAL HISTORY:   Past Medical History:  Past Medical History:   Diagnosis Date    Ascites     Asthma without status asthmaticus     Clostridium difficile colitis     Disorder of liver     multifocal cystic irregularities    DVT (deep venous thrombosis)     Gastroesophageal  reflux disease     H/O ETOH abuse     sober 2 years until 03/2014    Hypertension     Hypokalemia     Pancreatitis     S/P IVC filter        Past Surgical History:    Past Surgical History:   Procedure Laterality Date    CHOLECYSTECTOMY      COLONOSCOPY  12/26/2012    Procedure: COLONOSCOPY;  Surgeon: Gwenith Spitz, MD;  Location: [facility];  Service: Gastroenterology;  Laterality: N/A;    COLONOSCOPY, POLYPECTOMY  12/26/2012    Procedure: COLONOSCOPY, POLYPECTOMY;  Surgeon: Gwenith Spitz, MD;  Location: [facility];  Service: Gastroenterology;  Laterality: N/A;    DEBRIDEMENT & IRRIGATION, WOUND CLOSURE Right 08/29/2018    Procedure: WASHOUT AND CLOSURE OF TRAUMATIC WOUND, RIGHT FOREARM;  Surgeon: Arnoldo Hooker, MD;  Location: [facility];  Service: General;  Laterality: Right;  Right forearm I&D poss. wound closure    PANCREATECTOMY  01/2011    partial    THORACENTESIS Right 2012    pl effusion       Family History:    Family History   Adopted: Yes       Social History:     Social History     Socioeconomic History    Marital status: Single     Spouse name: Not on file    Number of children: Not on file    Years of education: Not on file    Highest education level: Not on file   Occupational History    Not on file   Social Needs    Financial resource strain: Not on file    Food insecurity     Worry: Not on file     Inability: Not on file    Transportation needs     Medical: Not on file     Non-medical: Not on file   Tobacco Use    Smoking status: Current Every Day Smoker     Packs/day: 0.50     Years: 30.00     Pack years: 15.00     Types: Cigarettes  Smokeless tobacco: Never Used   Substance and Sexual Activity    Alcohol use: Yes     Comment: occasional    Drug use: No    Sexual activity: Not on file   Lifestyle    Physical activity     Days per week: Not on file     Minutes per session: Not on file    Stress: Not on file   Relationships    Social connections      Talks on phone: Not on file     Gets together: Not on file     Attends religious service: Not on file     Active member of club or organization: Not on file     Attends meetings of clubs or organizations: Not on file     Relationship status: Not on file    Intimate partner violence     Fear of current or ex partner: Not on file     Emotionally abused: Not on file     Physically abused: Not on file     Forced sexual activity: Not on file   Other Topics Concern    Not on file   Social History Narrative    Not on file        PHYSICAL EXAM   Vitals: BP (!) 151/94    Pulse 93    Temp 98.5 F (36.9 C) (Skin)    Resp 18    Ht 1.651 m (5\' 5" )    Wt 68 kg (150 lb)    SpO2 93%    BMI 24.96 kg/m    Admission Weight: Weight: 68 kg (150 lb)  Last Weight:   Wt Readings from Last 1 Encounters:   10/14/18 68 kg (150 lb)         Body mass index is 24.96 kg/m.  I/O:     Intake/Output Summary (Last 24 hours) at 10/14/2018 1913  Last data filed at 10/14/2018 1835  Gross per 24 hour   Intake 1500 ml   Output --   Net 1500 ml     Vent settings:      Review of Systems   Constitutional: Negative.    HENT: Negative.    Eyes: Negative.    Respiratory: Negative.    Cardiovascular: Negative.    Gastrointestinal: Positive for abdominal pain, nausea and vomiting.   Genitourinary: Negative.    Musculoskeletal: Negative.    Neurological: Negative.    Endo/Heme/Allergies: Negative.    Psychiatric/Behavioral: Positive for substance abuse.       Physical Exam  Vitals signs and nursing note reviewed.   Constitutional:       General: She is not in acute distress.     Appearance: Normal appearance. She is ill-appearing. She is not toxic-appearing or diaphoretic.   HENT:      Head: Normocephalic and atraumatic.      Mouth/Throat:      Mouth: Mucous membranes are dry.      Pharynx: Oropharynx is clear.   Eyes:      General: No scleral icterus.     Conjunctiva/sclera: Conjunctivae normal.      Pupils: Pupils are equal, round, and reactive to light.    Neck:      Musculoskeletal: Normal range of motion and neck supple. No neck rigidity or muscular tenderness.   Cardiovascular:      Rate and Rhythm: Normal rate and regular rhythm.      Pulses: Normal pulses.  Heart sounds: No murmur.   Pulmonary:      Effort: Pulmonary effort is normal.      Breath sounds: Normal breath sounds.   Abdominal:      General: Abdomen is flat. Bowel sounds are normal. There is no distension.      Palpations: Abdomen is soft.      Tenderness: There is no abdominal tenderness. There is no guarding or rebound.   Musculoskeletal: Normal range of motion.      Right lower leg: No edema.      Left lower leg: No edema.   Skin:     General: Skin is warm and dry.      Capillary Refill: Capillary refill takes less than 2 seconds.      Coloration: Skin is not jaundiced.   Neurological:      General: No focal deficit present.      Mental Status: She is alert and oriented to person, place, and time.   Psychiatric:         Mood and Affect: Mood normal.         Behavior: Behavior normal.         Thought Content: Thought content normal.         Judgment: Judgment normal.            Tubes/Lines/Airway              Patient Lines/Drains/Airways Status    Active PICC Line / CVC Line / PIV Line / Drain / Airway / Intraosseous Line / Epidural Line / ART Line / Line / Wound / Pressure Ulcer / NG/OG Tube     Name:   Placement date:   Placement time:   Site:   Days:    Peripheral IV 10/14/18 Right Antecubital   10/14/18    1710    Antecubital   less than 1    Wound 08/06/18 Other (Comment) Arm   08/06/18    0045    Arm   69    Wound 08/30/18 Surgical Incision Arm Right   08/30/18    0104    Arm   45                       LABS:   Labs Reviewed:    CBC: Recent Labs   Lab 10/14/18  1519   WBC 13.0*   Hemoglobin 13.5   Hematocrit 40.2   PLT CT 130         Coags:         ABGs:  No results found for: ABGCOLLECTIO, ALLENSTEST, PHART, PCO2ART, PO2ART, HCO3ART, BEART, O2SATART    BE, ISTAT   Date Value Ref Range  Status   08/05/2018  mMol/L Final    Unable to calculate result due to analyte out of the analytical measurement range     DELS, ISTAT   Date Value Ref Range Status   08/05/2018 Room Air  Final     HCO3, ISTAT   Date Value Ref Range Status   08/05/2018 <14.1 (LL) 20.0 - 29.0 mMol/L Final     O2 Sat, %, ISTAT   Date Value Ref Range Status   08/05/2018  96 - 100 % Final    Unable to calculate result due to analyte out of the analytical measurement range     pH, ISTAT   Date Value Ref Range Status   08/05/2018 7.28 (L) 7.35 - 7.45 Final  PO2, ISTAT   Date Value Ref Range Status   08/05/2018 74 (L) 75 - 100 mm Hg Final     TCO2 I-Stat   Date Value Ref Range Status   08/05/2018  24 - 29 mMol/L Final    Unable to calculate result due to analyte out of the analytical measurement range   08/05/2018 14 (LL) 24 - 29 mMol/L Final     Comment:     Results called and read back by (licensed clinician/date/time/tech): DR. VAN WIE [id number]   10/21/2015 20 (L) 24 - 29 mMol/L Final     PCO2, ISTAT   Date Value Ref Range Status   08/05/2018 24.8 (L) 35.0 - 45.0 mm Hg Final     i-STAT FIO2   Date Value Ref Range Status   08/05/2018 21.00 % Final     Lactic Acid I-Stat   Date Value Ref Range Status   08/05/2018 4.0 (HH) 0.5 - 1.9 mMol/L Final    Chemistry: Recent Labs     10/14/18  1519   Sodium 130*   Potassium 4.3   Chloride 94*   CO2 6.6*   BUN 16   Creatinine 1.14   EGFR 53*   Glucose 47*   Calcium 9.5       LFTs: Recent Labs   Lab 10/14/18  1519   Albumin 4.2   Protein, Total 9.1*   Bilirubin, Total 1.8*   Alkaline Phosphatase 342*   ALT 42   AST (SGOT) 102*   Glucose 47*              Other:    RADIOLOGY / IMAGING:   Imaging personally reviewed by me, including: None     ATTESTATION & BILLING      Patient's condition and plan discussed with: patient, bedside nurse and emergency physician    Billing:  Critical care time: 52 minutes    I managed/supervised life or organ supporting interventions that required frequent  physician assessments. I devoted my full attention in the ICU to the direct care of this patient for this period of time while critically ill.    Any critical care time was performed today and is exclusive of teaching, billable procedures, and not overlapping with any other providers.    Signed by: Marlou Porch, NP   cc:Pcp, None, MD

## 2018-10-15 ENCOUNTER — Inpatient Hospital Stay: Payer: Medicaid Other

## 2018-10-15 DIAGNOSIS — E871 Hypo-osmolality and hyponatremia: Secondary | ICD-10-CM | POA: Diagnosis present

## 2018-10-15 DIAGNOSIS — D696 Thrombocytopenia, unspecified: Secondary | ICD-10-CM | POA: Diagnosis present

## 2018-10-15 LAB — HEPATIC FUNCTION PANEL
ALT: 30 U/L (ref 0–55)
AST (SGOT): 71 U/L — ABNORMAL HIGH (ref 10–42)
Albumin/Globulin Ratio: 0.87 Ratio (ref 0.80–2.00)
Albumin: 3.3 gm/dL — ABNORMAL LOW (ref 3.5–5.0)
Alkaline Phosphatase: 247 U/L — ABNORMAL HIGH (ref 40–145)
Bilirubin Direct: 1.3 mg/dL — ABNORMAL HIGH (ref 0.0–0.3)
Bilirubin, Total: 1.9 mg/dL — ABNORMAL HIGH (ref 0.1–1.2)
Globulin: 3.8 gm/dL (ref 2.0–4.0)
Protein, Total: 7.1 gm/dL (ref 6.0–8.3)

## 2018-10-15 LAB — CBC AND DIFFERENTIAL
Basophils %: 0.2 % (ref 0.0–3.0)
Basophils %: 0.3 % (ref 0.0–3.0)
Basophils Absolute: 0 10*3/uL (ref 0.0–0.3)
Basophils Absolute: 0 10*3/uL (ref 0.0–0.3)
Eosinophils %: 0.2 % (ref 0.0–7.0)
Eosinophils %: 0.4 % (ref 0.0–7.0)
Eosinophils Absolute: 0 10*3/uL (ref 0.0–0.8)
Eosinophils Absolute: 0 10*3/uL (ref 0.0–0.8)
Hematocrit: 38.4 % (ref 36.0–48.0)
Hematocrit: 34.2 % — ABNORMAL LOW (ref 36.0–48.0)
Hemoglobin: 12.6 gm/dL (ref 12.0–16.0)
Hemoglobin: 11.7 gm/dL — ABNORMAL LOW (ref 12.0–16.0)
Lymphocytes Absolute: 0.2 10*3/uL — ABNORMAL LOW (ref 0.6–5.1)
Lymphocytes Absolute: 0.4 10*3/uL — ABNORMAL LOW (ref 0.6–5.1)
Lymphocytes: 9.2 % — ABNORMAL LOW (ref 15.0–46.0)
Lymphocytes: 15 % (ref 15.0–46.0)
MCH: 34 pg (ref 28–35)
MCH: 35 pg (ref 28–35)
MCHC: 33 gm/dL (ref 32–36)
MCHC: 34 gm/dL (ref 32–36)
MCV: 102 fL — ABNORMAL HIGH (ref 80–100)
MCV: 102 fL — ABNORMAL HIGH (ref 80–100)
MPV: 8.2 fL (ref 6.0–10.0)
MPV: 7.8 fL (ref 6.0–10.0)
Monocytes Absolute: 0.4 10*3/uL (ref 0.1–1.7)
Monocytes Absolute: 0.3 10*3/uL (ref 0.1–1.7)
Monocytes: 15.4 % — ABNORMAL HIGH (ref 3.0–15.0)
Monocytes: 12.8 % (ref 3.0–15.0)
Neutrophils %: 75.2 % (ref 42.0–78.0)
Neutrophils %: 71.6 % (ref 42.0–78.0)
Neutrophils Absolute: 1.9 10*3/uL (ref 1.7–8.6)
Neutrophils Absolute: 1.7 10*3/uL (ref 1.7–8.6)
PLT CT: 70 10*3/uL — ABNORMAL LOW (ref 130–440)
PLT CT: 68 10*3/uL — ABNORMAL LOW (ref 130–440)
RBC: 3.76 10*6/uL — ABNORMAL LOW (ref 3.80–5.00)
RBC: 3.34 10*6/uL — ABNORMAL LOW (ref 3.80–5.00)
RDW: 14.8 % — ABNORMAL HIGH (ref 11.0–14.0)
RDW: 14.7 % — ABNORMAL HIGH (ref 11.0–14.0)
WBC: 2.5 10*3/uL — ABNORMAL LOW (ref 4.0–11.0)
WBC: 2.4 10*3/uL — ABNORMAL LOW (ref 4.0–11.0)

## 2018-10-15 LAB — BASIC METABOLIC PANEL
Anion Gap: 23.6 mMol/L — ABNORMAL HIGH (ref 7.0–18.0)
Anion Gap: 14.7 mMol/L (ref 7.0–18.0)
Anion Gap: 14.1 mMol/L (ref 7.0–18.0)
Anion Gap: 13.6 mMol/L (ref 7.0–18.0)
BUN / Creatinine Ratio: 15.5 Ratio (ref 10.0–30.0)
BUN / Creatinine Ratio: 11.7 Ratio (ref 10.0–30.0)
BUN / Creatinine Ratio: 11.5 Ratio (ref 10.0–30.0)
BUN / Creatinine Ratio: 10.8 Ratio (ref 10.0–30.0)
BUN: 13 mg/dL (ref 7–22)
BUN: 12 mg/dL (ref 7–22)
BUN: 10 mg/dL (ref 7–22)
BUN: 9 mg/dL (ref 7–22)
CO2: 18 mMol/L — ABNORMAL LOW (ref 20–30)
CO2: 24 mMol/L (ref 20–30)
CO2: 26 mMol/L (ref 20–30)
CO2: 25 mMol/L (ref 20–30)
Calcium: 7.8 mg/dL — ABNORMAL LOW (ref 8.5–10.5)
Calcium: 8 mg/dL — ABNORMAL LOW (ref 8.5–10.5)
Calcium: 8.7 mg/dL (ref 8.5–10.5)
Calcium: 8.7 mg/dL (ref 8.5–10.5)
Chloride: 92 mMol/L — ABNORMAL LOW (ref 98–110)
Chloride: 93 mMol/L — ABNORMAL LOW (ref 98–110)
Chloride: 95 mMol/L — ABNORMAL LOW (ref 98–110)
Chloride: 94 mMol/L — ABNORMAL LOW (ref 98–110)
Creatinine: 0.84 mg/dL (ref 0.60–1.20)
Creatinine: 1.03 mg/dL (ref 0.60–1.20)
Creatinine: 0.87 mg/dL (ref 0.60–1.20)
Creatinine: 0.83 mg/dL (ref 0.60–1.20)
EGFR: 77 mL/min/{1.73_m2} (ref 60–150)
EGFR: 60 mL/min/{1.73_m2} (ref 60–150)
EGFR: 74 mL/min/{1.73_m2} (ref 60–150)
EGFR: 78 mL/min/{1.73_m2} (ref 60–150)
Glucose: 236 mg/dL — ABNORMAL HIGH (ref 71–99)
Glucose: 207 mg/dL — ABNORMAL HIGH (ref 71–99)
Glucose: 158 mg/dL — ABNORMAL HIGH (ref 71–99)
Glucose: 176 mg/dL — ABNORMAL HIGH (ref 71–99)
Osmolality Calculated: 269 mOsm/kg — ABNORMAL LOW (ref 275–300)
Osmolality Calculated: 263 mOsm/kg — ABNORMAL LOW (ref 275–300)
Osmolality Calculated: 265 mOsm/kg — ABNORMAL LOW (ref 275–300)
Osmolality Calculated: 262 mOsm/kg — ABNORMAL LOW (ref 275–300)
Potassium: 3.6 mMol/L (ref 3.5–5.3)
Potassium: 3.7 mMol/L (ref 3.5–5.3)
Potassium: 4.1 mMol/L (ref 3.5–5.3)
Potassium: 3.6 mMol/L (ref 3.5–5.3)
Sodium: 130 mMol/L — ABNORMAL LOW (ref 136–147)
Sodium: 128 mMol/L — ABNORMAL LOW (ref 136–147)
Sodium: 131 mMol/L — ABNORMAL LOW (ref 136–147)
Sodium: 129 mMol/L — ABNORMAL LOW (ref 136–147)

## 2018-10-15 LAB — VH DEXTROSE STICK GLUCOSE
Glucose POCT: 298 mg/dL — ABNORMAL HIGH (ref 71–99)
Glucose POCT: 190 mg/dL — ABNORMAL HIGH (ref 71–99)
Glucose POCT: 178 mg/dL — ABNORMAL HIGH (ref 71–99)
Glucose POCT: 143 mg/dL — ABNORMAL HIGH (ref 71–99)
Glucose POCT: 145 mg/dL — ABNORMAL HIGH (ref 71–99)

## 2018-10-15 LAB — LIPASE: Lipase: 886 U/L — ABNORMAL HIGH (ref 8–78)

## 2018-10-15 LAB — PHOSPHORUS
Phosphorus: 1.6 mg/dL — ABNORMAL LOW (ref 2.3–4.7)
Phosphorus: 1.7 mg/dL — ABNORMAL LOW (ref 2.3–4.7)

## 2018-10-15 LAB — I-STAT CG4 ARTERIAL CARTRIDGE
BE, ISTAT: -4 mMol/L
HCO3, ISTAT: 21.4 mMol/L (ref 20.0–29.0)
Lactic Acid I-Stat: 0.6 mMol/L (ref 0.5–1.9)
O2 Sat, %, ISTAT: 92 % — ABNORMAL LOW (ref 96–100)
PCO2, ISTAT: 37.7 mm Hg (ref 35.0–45.0)
PO2, ISTAT: 66 mm Hg — ABNORMAL LOW (ref 75–100)
Room Number I-Stat: 3540
TCO2 I-Stat: 23 mMol/L — ABNORMAL LOW (ref 24–29)
i-STAT FIO2: 0 %
i-STAT Liters Per Minute: 2 L/min
pH, ISTAT: 7.36 (ref 7.35–7.45)

## 2018-10-15 LAB — CALCIUM, IONIZED
Calcium, Ionized: 4.12 mg/dL — ABNORMAL LOW (ref 4.35–5.10)
Calcium, Ionized: 4.58 mg/dL (ref 4.35–5.10)

## 2018-10-15 LAB — LACTATE DEHYDROGENASE: LDH: 286 U/L — ABNORMAL HIGH (ref 94–250)

## 2018-10-15 LAB — LIPID PANEL
Cholesterol: 172 mg/dL (ref 75–199)
Coronary Heart Disease Risk: 2.46
HDL: 70 mg/dL — ABNORMAL HIGH (ref 45–65)
LDL Calculated: 83 mg/dL
Triglycerides: 97 mg/dL (ref 10–150)
VLDL: 19 (ref 0–40)

## 2018-10-15 LAB — MAGNESIUM
Magnesium: 2.5 mg/dL (ref 1.6–2.6)
Magnesium: 2.1 mg/dL (ref 1.6–2.6)
Magnesium: 1.8 mg/dL (ref 1.6–2.6)

## 2018-10-15 MED ORDER — POTASSIUM CHLORIDE 10 MEQ/100ML IV SOLN
10.0000 meq | INTRAVENOUS | Status: AC
Start: 2018-10-15 — End: 2018-10-15
  Administered 2018-10-15 (×2): 10 meq via INTRAVENOUS
  Filled 2018-10-15 (×2): qty 100

## 2018-10-15 MED ORDER — SODIUM CHLORIDE 0.9 % IV BOLUS
1000.00 mL | Freq: Once | INTRAVENOUS | Status: AC
Start: 2018-10-15 — End: 2018-10-15
  Administered 2018-10-15: 08:00:00 1000 mL via INTRAVENOUS

## 2018-10-15 MED ORDER — INSULIN LISPRO (1 UNIT DIAL) 100 UNIT/ML SC SOPN
1.00 [IU] | PEN_INJECTOR | Freq: Every day | SUBCUTANEOUS | Status: DC | PRN
Start: 2018-10-15 — End: 2018-10-17

## 2018-10-15 MED ORDER — ONDANSETRON HCL 4 MG/2ML IJ SOLN
4.00 mg | INTRAMUSCULAR | Status: DC | PRN
Start: 2018-10-15 — End: 2018-10-17
  Administered 2018-10-15: 06:00:00 4 mg via INTRAVENOUS
  Filled 2018-10-15: qty 2

## 2018-10-15 MED ORDER — HYDROMORPHONE HCL 0.5 MG/0.5 ML IJ SOLN
0.50 mg | Freq: Once | INTRAMUSCULAR | Status: DC
Start: 2018-10-15 — End: 2018-10-17
  Filled 2018-10-15: qty 0.5

## 2018-10-15 MED ORDER — INSULIN LISPRO (1 UNIT DIAL) 100 UNIT/ML SC SOPN
1.00 [IU] | PEN_INJECTOR | Freq: Every evening | SUBCUTANEOUS | Status: DC
Start: 2018-10-15 — End: 2018-10-17

## 2018-10-15 MED ORDER — SODIUM CHLORIDE (PF) 0.9 % IJ SOLN
3.00 mL | Freq: Three times a day (TID) | INTRAMUSCULAR | Status: DC
Start: 2018-10-15 — End: 2018-10-17
  Administered 2018-10-15 – 2018-10-16 (×4): 3 mL via INTRAVENOUS

## 2018-10-15 MED ORDER — INSULIN LISPRO (1 UNIT DIAL) 100 UNIT/ML SC SOPN
1.00 [IU] | PEN_INJECTOR | Freq: Three times a day (TID) | SUBCUTANEOUS | Status: DC
Start: 2018-10-16 — End: 2018-10-17

## 2018-10-15 MED ORDER — SODIUM CHLORIDE 0.9 % IV SOLN
INTRAVENOUS | Status: DC
Start: 2018-10-15 — End: 2018-10-15

## 2018-10-15 MED ORDER — POTASSIUM & SODIUM PHOSPHATES 280-160-250 MG PO PACK
2.00 | PACK | Freq: Four times a day (QID) | ORAL | Status: AC
Start: 2018-10-15 — End: 2018-10-15
  Administered 2018-10-15 (×3): 2 via ORAL
  Filled 2018-10-15 (×3): qty 2

## 2018-10-15 MED ORDER — LORAZEPAM 1 MG PO TABS
4.0000 mg | ORAL_TABLET | ORAL | Status: DC | PRN
Start: 2018-10-15 — End: 2018-10-17

## 2018-10-15 MED ORDER — FAMOTIDINE 20 MG PO TABS
20.0000 mg | ORAL_TABLET | Freq: Two times a day (BID) | ORAL | Status: DC
Start: 2018-10-15 — End: 2018-10-17
  Administered 2018-10-15 – 2018-10-17 (×5): 20 mg via ORAL
  Filled 2018-10-15 (×6): qty 1

## 2018-10-15 MED ORDER — VH HYDROMORPHONE HCL PF 1 MG/ML CARPUJECT
1.00 mg | INTRAMUSCULAR | Status: DC | PRN
Start: 2018-10-15 — End: 2018-10-17

## 2018-10-15 MED ORDER — HYDROMORPHONE HCL 0.5 MG/0.5 ML IJ SOLN
1.00 mg | INTRAMUSCULAR | Status: DC | PRN
Start: 2018-10-15 — End: 2018-10-15
  Administered 2018-10-15 (×2): 1 mg via INTRAVENOUS
  Filled 2018-10-15 (×2): qty 1

## 2018-10-15 MED ORDER — HYDROMORPHONE HCL 2 MG/ML IJ SOLN
2.00 mg | INTRAMUSCULAR | Status: DC | PRN
Start: 2018-10-15 — End: 2018-10-17
  Administered 2018-10-15 (×2): 2 mg via INTRAVENOUS
  Filled 2018-10-15 (×2): qty 1

## 2018-10-15 MED ORDER — CALCIUM GLUCONATE 10 % IV SOLN
1.50 g | Freq: Once | INTRAVENOUS | Status: AC
Start: 2018-10-15 — End: 2018-10-15
  Administered 2018-10-15: 09:00:00 1.5 g via INTRAVENOUS
  Filled 2018-10-15: qty 15

## 2018-10-15 MED ORDER — POTASSIUM CHLORIDE 10 MEQ/100ML IV SOLN
10.0000 meq | INTRAVENOUS | Status: AC
Start: 2018-10-15 — End: 2018-10-15
  Administered 2018-10-15 (×2): 10 meq via INTRAVENOUS
  Filled 2018-10-15 (×2): qty 100

## 2018-10-15 MED ORDER — MAGNESIUM OXIDE 400 MG TABS (WRAP)
400.00 mg | ORAL_TABLET | Freq: Four times a day (QID) | ORAL | Status: AC
Start: 2018-10-15 — End: 2018-10-15
  Administered 2018-10-15 (×2): 400 mg via ORAL
  Filled 2018-10-15 (×2): qty 1

## 2018-10-15 MED ORDER — VH MAGNESIUM SULFATE 2 G IN 50 ML IV PREMIX
2.00 g | Freq: Once | INTRAVENOUS | Status: AC
Start: 2018-10-15 — End: 2018-10-15
  Administered 2018-10-15: 12:00:00 2 g via INTRAVENOUS
  Filled 2018-10-15: qty 50

## 2018-10-15 MED ORDER — LORAZEPAM 1 MG PO TABS
2.0000 mg | ORAL_TABLET | ORAL | Status: DC | PRN
Start: 2018-10-15 — End: 2018-10-17

## 2018-10-15 MED ORDER — LACTATED RINGERS IV SOLN
INTRAVENOUS | Status: DC
Start: 2018-10-15 — End: 2018-10-17

## 2018-10-15 NOTE — Assessment & Plan Note (Signed)
A/P: Ongoing active alcohol abuse.  Continuation thiamine and folic acid supplementation  Continuation of minds protocol and observation/treatment for-alcohol withdrawal  Alcohol rehabilitation program when stable

## 2018-10-15 NOTE — UM Notes (Addendum)
Regarding [name] ref # [id number]  Contact sue [name] [phone]        Mcg criteria used  m-250    Dx-Acute alcohol-induced pancreatitis    Hx-chronic pancreatitis, alcoholic cirrhosis, chronic alcoholism     presents to the emergency department for complaint of abdominal pain.  She relates her severe abdominal pain to her chronic pancreatitis after ingestion of alcohol on 4 July with recurrent nausea vomiting and inability to tolerate oral intake.  She describes pain as 8 out of 10 with some radiation to her back.  She has had no fever or chills.  Her last meal was on 12 October 2018. She states that her last alcohol intake was on 13 October 2018 (vodka).  She states that she usually experiences signs of early alcohol withdrawal approximately 24 hours after her last drink.    98.5  Hr 103  rr 20  175/109  Sat 93%       Lipase 1771    Results for [name], [name]    Ref. Range 10/14/2018 20:27   Glucose Latest Ref Range: 71 - 99 mg/dL 95   BUN Latest Ref Range: 7 - 22 mg/dL 15   Creatinine Latest Ref Range: 0.60 - 1.20 mg/dL 0.98   BUN / Creatinine Ratio Latest Ref Range: 10.0 - 30.0 Ratio 14.6   Sodium Latest Ref Range: 136 - 147 mMol/L 130 (L)   Potassium Latest Ref Range: 3.5 - 5.3 mMol/L 4.1   Chloride Latest Ref Range: 98 - 110 mMol/L 96 (L)   CO2 Latest Ref Range: 20.0 - 30.0 mMol/L 8.1 (LL)   Calcium Latest Ref Range: 8.5 - 10.5 mg/dL 8.6   Anion Gap Latest Ref Range: 7.0 - 18.0 mMol/L 30.0 (H)   EGFR African American Latest Ref Range: 60 - 150 mL/min/1.75m2 60   Osmolality Measured Latest Ref Range: 275 - 295 mOsm/kg 315 (H)   Osmolality Calculated Latest Ref Range: 275 - 300 mOsm/kg 261 (L)   Magnesium Latest Ref Range: 1.6 - 2.6 mg/dL 1.4 (L)   Phosphorus Latest Ref Range: 2.3 - 4.7 mg/dL 3.7     Results for [name], [name] (MRN [id number]) as of 10/15/2018 10:13   Ref. Range 10/14/2018 20:22   pH, ISTAT Latest Ref Range: 7.35 - 7.45  7.20 (LL)   PCO2, ISTAT Latest Ref Range: 35.0 - 45.0 mm Hg 28.5  (L)   PO2, ISTAT Latest Ref Range: 75 - 100 mm Hg 75   HCO3, ISTAT Latest Ref Range: 20.0 - 29.0 mMol/L <14.1 (LL)   TCO2 I-Stat Latest Ref Range: 24 - 29 mMol/L Unable to calculate result due to analyte out of the analytical measurement range   BE, ISTAT Latest Units: mMol/L Unable to calculate result due to analyte out of the analytical measurement range   O2 Sat, %, ISTAT Latest Ref Range: 96 - 100 % Unable to calculate result due to analyte out of the analytical measurement range   DELS, ISTAT Unknown Room Air                   Admit to icu , iv ns x 2 liters bolus, iv d5 w/150 meq na bicarb  At 250/hr  Then iv LR at 250 maintenance for now   Hypoglycemia Most likely secondary to alcoholic ketoacidosis,Fingersticks/SSI every hour until stable  CIWA protocol  -Start phenobarbital protocol iv bolus 260 mg load then 64 mg iv q 8   maintenance dosing  -Seizure/aspiration precautions  -Thiamine folate  and multivitamin  Iv dilaudid x 4 for pain so far as prn dose   Iv versed x 64for ciwa scale since admit

## 2018-10-15 NOTE — Assessment & Plan Note (Signed)
A/P: No active issues.  PRN bronchodilator therapy

## 2018-10-15 NOTE — Consults (Addendum)
Nutrition Therapy  Nutrition Assessment    Patient Information:     Name:[name]   Age: 58 y.o.   Sex: female     MRN: [id number]    Recommendation:     1. Diet advancement per MD, as tolerated  2. Gelatein BID once diet allows    Nutrition History:     MST = 3 (unsure weight loss & decreased appetite)    58 yo pt w/acute alcoholic pancreatitis, acute alcohol intoxication, alcoholic ketoacidosis. On MINDS protocol. BS in the upper 100s now, continuing fingersticks with SSI as needed. Lipase was 1771 at admission, 886 this morning. Pt has hx of alcohol abuse and reports that she does not eat much typically, some days eats a lot and some days does not eat much, has been this way for at least 2 years. Admits to sometimes going days without eating anything at all. Most recently has not eaten anything since July 4th, at which time she had been drinking over the holiday. Reports UBW of 190 lbs, says she last weighed this much 2 years ago. Limited recent weight hx is available for review in Epic, shows weight of 190 lbs in August 2017, but it appears pt's weight has been stable at ~150-155 lbs since at least the end of April. Does not meet criteria for malnutrition at this time. Says she feels hungry now, but still has some nausea. Discussed likely diet progression to clear liquids, soft/low fiber foods, etc. She is agreeable to ONS once diet advances, willing to try Ensure Clear and/or Gelatein.     Nutrition Focus Physical Findings: Normal, mild fat/muscle loss noted    Nutrition Risk Level: High    Nutrition Diagnosis:     Inadequate oral intake related to acute pancreatitis as evidenced by reported nausea and currently NPO        Monitoring:  Evaluation:    PO/EN/PN intake:  Total energy intake, Amount of food  and Liquid meal replacement, or supplement intake   Labs:  Electrolyte Profile, Renal Profile and Glucose, casual    GI Profile:  Bowel Function   Nutrition Focused Physical:  Overall appearance  and Digestive system       Assessment Data:     Admission Dx:  Alcoholic ketoacidosis [E87.2]  ETOH abuse [F10.10]  Pancreatitis, alcoholic, acute [K85.20]  Chronic alcoholic pancreatitis [K86.0]  Tobacco use [Z72.0]  Metabolic acidosis with increased anion gap and accumulation of organic acids [E87.2]  Alcohol-induced acute pancreatitis without infection or necrosis [K85.20]  Intractable vomiting with nausea, unspecified vomiting type [R11.2]  PMH:  has a past medical history of Ascites, Asthma without status asthmaticus, Clostridium difficile colitis, Disorder of liver, DVT (deep venous thrombosis), Gastroesophageal reflux disease, H/O ETOH abuse, Hypertension, Hypokalemia, Pancreatitis, and S/P IVC filter.  PSH:  has a past surgical history that includes Colonoscopy (12/26/2012); COLONOSCOPY, POLYPECTOMY (12/26/2012); Cholecystectomy; Pancreatectomy (01/2011); Thoracentesis (Right, 2012); and DEBRIDEMENT & IRRIGATION, WOUND CLOSURE (Right, 08/29/2018).     Height: 1.676 m (5\' 6" )   Weight: 71.3 kg (157 lb 3 oz)   BMI: Body mass index is 25.37 kg/m.   IBW: 59.1 kg    Weight Monitoring Weight Weight Method   01/01/2015 85.004 kg    04/17/2015 84.8 kg Actual   04/22/2015 81.6 kg Actual   10/21/2015 87 kg Actual   11/23/2015 85.1 kg Actual   11/27/2015 86.365 kg Standing Scale   08/05/2018 69 kg Standing Scale   08/06/2018 69.083 kg Standing Scale  08/20/2018 68.04 kg    08/29/2018 69.9 kg Actual   10/14/2018 68.1 kg Bed Scale   10/15/2018 71.3 kg      Pertinent Meds: includes dilaudid, insulin, pepcid, multivitamin, folic acid, thiamine, electrolyte replacement  Pertinent Labs:  Recent Labs   Lab 10/15/18  0633 10/15/18  0209 10/14/18  2027 10/14/18  1519   Sodium 128* 130* 130* 130*   Potassium 3.7 3.6 4.1 4.3   Chloride 93* 92* 96* 94*   CO2 24 18* 8.1* 6.6*   BUN 12 13 15 16    Creatinine 1.03 0.84 1.03 1.14   Glucose 207* 236* 95 47*   Calcium 8.0* 7.8* 8.6 9.5   Magnesium 2.1 2.5 1.4*  --    Phosphorus 1.6*  --  3.7  --       Lab Results   Component Value Date    LIP 886 (H) 10/15/2018     Diet Order:  Orders Placed This Encounter   Procedures   . Diet NPO effective now - Other      GI symptoms:  nausea  Hydration:   . lactated ringers 250 mL/hr at 10/15/18 0759   . midazolam       I/O:  6822/2100, +4596 since admit  Skin:  No PI noted; Braden 18, Nutrition 2    Food Security Issues:  No    Learning Needs:  No education needs at this time    Estimated Needs:  Estimated Energy Needs  Total Energy Estimated Needs: 1700 - 2045 kcal  Method for Estimating Needs: 25-30 kcal/kg admit weight (68.1)  Estimated Protein Needs  Total Protein Estimated Needs: 82 - 102 g  Method for Estimating Needs: 1.2-1.5 g/kg admit weight (68.1)       Additional Comments:       [name], RD  10/15/2018 10:08 AM

## 2018-10-15 NOTE — Assessment & Plan Note (Signed)
A/P: Resolution of anion gap acidosis with volume replacement and sodium bicarbonate replacement.  Further evaluation based on repeat laboratory

## 2018-10-15 NOTE — Assessment & Plan Note (Signed)
A/P: Thrombocytopenia on admission presumed secondary to alcoholic marrow suppression.  Monitor platelet count count.  no indication for platelet transfusion at present time.    No active bleeding.

## 2018-10-15 NOTE — Consults (Signed)
GASTROENTEROLOGY CONSULTATION  [facility]    Date Time: 10/15/18 10:07 AM  Patient Name: [name]  Requesting Physician: Virl Cagey*    PCP: PCP None, MD  Consulting GI: Marcene Corning PA-C/Dr. Sondra Come    Reason for Consultation / Chief Complaint:   We are asked to see this patient for acute alcoholic pancreatitis    Assessment:   Principal Problem:    Pancreatitis, alcoholic, acute  Active Problems:    Essential hypertension    ETOH abuse    Asthma    Chronic alcoholic pancreatitis    History of partial pancreatectomy    Alcoholic ketoacidosis    Asthma without status asthmaticus    Hypoglycemia, unspecified    Metabolic acidosis with increased anion gap and accumulation of organic acids    Thrombocytopenia, unspecified    Hyponatremia    Hypomagnesemia        Plan:   continue medical management,no MRCP at this time  Please see attending's addendum    History:   [name] is a 58 y.o. female who presents to the hospital on 10/14/2018 with history of chronic pancreatitis and alcoholic cirrhosis, admitted with acute alcoholic induced pancreatitis, acute alcohol intoxication and alcoholic ketoacidosis with profound hypoglycemia.    Pt has a long standing h/o chronic pancreatitis. Reports sharing 1L of liquor with her roommate over July 4th, followed by nausea and vomiting for 3 days. She presented to the ED with abdominal pain, nausea, and vomiting. Pain was 8/10 pain radiating to her mid back. States she has been confused over the last day. No appetite over the last 3 days. Denies fever, chills, change in BMs, melena, hematochezia, and hematemesis.     Partial pancreatectomy in 2012 at [facility]. H/o pancreatic divisum. Pancreatic stent placement 06/2016 and removed 10/2016 at [facility]. Was also seen at [facility] in 01/2017, MRI liver protocol for pancreatic pseudocyst. Impression: improved chronic pancreatitis with decreased pancreatic duct dilation. Interval  resolution of peripancreatic fluid collections. No focal pancreatic lesions identified.     Past Medical History:     Past Medical History:   Diagnosis Date    Ascites     Asthma without status asthmaticus     Clostridium difficile colitis     Disorder of liver     multifocal cystic irregularities    DVT (deep venous thrombosis)     Gastroesophageal reflux disease     H/O ETOH abuse     sober 2 years until 03/2014    Hypertension     Hypokalemia     Pancreatitis     S/P IVC filter        Endoscopic History   Colonoscopy: colon polyps 2014  Upper Endoscopy: none    Past Surgical History:     Past Surgical History:   Procedure Laterality Date    CHOLECYSTECTOMY      COLONOSCOPY  12/26/2012    Procedure: COLONOSCOPY;  Surgeon: Gwenith Spitz, MD;  Location: [facility];  Service: Gastroenterology;  Laterality: N/A;    COLONOSCOPY, POLYPECTOMY  12/26/2012    Procedure: COLONOSCOPY, POLYPECTOMY;  Surgeon: Gwenith Spitz, MD;  Location: [facility];  Service: Gastroenterology;  Laterality: N/A;    DEBRIDEMENT & IRRIGATION, WOUND CLOSURE Right 08/29/2018    Procedure: WASHOUT AND CLOSURE OF TRAUMATIC WOUND, RIGHT FOREARM;  Surgeon: Arnoldo Hooker, MD;  Location: [facility];  Service: General;  Laterality: Right;  Right forearm I&D poss. wound closure    PANCREATECTOMY  01/2011  partial    THORACENTESIS Right 2012    pl effusion       Family History:     Family History   Adopted: Yes       Social History:     Social History     Socioeconomic History    Marital status: Single     Spouse name: Not on file    Number of children: Not on file    Years of education: Not on file    Highest education level: Not on file   Occupational History    Not on file   Social Needs    Financial resource strain: Not on file    Food insecurity     Worry: Not on file     Inability: Not on file    Transportation needs     Medical: Not on file     Non-medical: Not on file   Tobacco Use    Smoking status:  Current Every Day Smoker     Packs/day: 0.50     Years: 30.00     Pack years: 15.00     Types: Cigarettes    Smokeless tobacco: Never Used   Substance and Sexual Activity    Alcohol use: Yes     Comment: occasional    Drug use: No    Sexual activity: Not on file   Lifestyle    Physical activity     Days per week: Not on file     Minutes per session: Not on file    Stress: Not on file   Relationships    Social connections     Talks on phone: Not on file     Gets together: Not on file     Attends religious service: Not on file     Active member of club or organization: Not on file     Attends meetings of clubs or organizations: Not on file     Relationship status: Not on file    Intimate partner violence     Fear of current or ex partner: Not on file     Emotionally abused: Not on file     Physically abused: Not on file     Forced sexual activity: Not on file   Other Topics Concern    Not on file   Social History Narrative    Not on file       Allergies:     Allergies   Allergen Reactions    Morphine Nausea Only       Medications:     Current Facility-Administered Medications   Medication Dose Route Frequency    enoxaparin  40 mg Subcutaneous Q24H    famotidine  20 mg Oral Q12H SCH    folic acid  1 mg Oral Daily    HYDROmorphone  0.5 mg Intravenous Once    insulin lispro (1 Unit Dial)  1-9 Units Subcutaneous Q4H SCH    magnesium oxide  400 mg Oral Q6H    multivitamin  1 tablet Oral Daily    nursing care medication protocol placeholder  1 each Does not apply See Admin Instructions    PHENobarbital  65 mg Intravenous Q8H    potassium & sodium phosphates  2 packet Oral Q6H    potassium chloride  10 mEq Intravenous Q1H    sodium chloride (PF)  10 mL Intracatheter Q12H SCH    sodium chloride (PF)  3 mL Intravenous Q8H    sodium chloride (  PF)  3 mL Intravenous Q8H    vitamin B-1  100 mg Oral Daily      lactated ringers 250 mL/hr at 10/15/18 0759    midazolam         Review of Systems:   Review of  Systems - History obtained from the patient and chart review  General ROS: negative for fevers, chills, night sweats. Positive for fatigue   Psychological ROS: substance abuse  Ophthalmic ROS: negative  ENT ROS: negative  Hematological and Lymphatic ROS: negative  Endocrine ROS: negative  Respiratory ROS: no cough, shortness of breath, or wheezing  Cardiovascular ROS: no chest pain or palpitations  Gastrointestinal ROS: per HPI  Genito-urinary: negative  Musculoskeletal ROS: back pain  Neurological ROS: negative  Dermatological ROS: negative    ROS as above otherwise negative    Physical Exam:   Blood pressure 119/77, pulse 100, temperature 97.9 F (36.6 C), temperature source Oral, resp. rate 13, height 1.676 m (5\' 6" ), weight 71.3 kg (157 lb 3 oz), SpO2 95 %.    General appearance - alert, oriented to person, place, time and event. NAD  Neuro - No gross neurologic deficits, moves all extremities normally in bed  Eyes - pupils equal and reactive, extraocular eye movements intact, sclera anicteric  Ears - hearing grossly normal bilaterally  Mouth - mucous membranes dry  Chest - clear to auscultation anteriorly, no wheezes, rales or rhonchi, symmetric air entry, normal effort. Non-tender to palpation  Heart - normal rate, regular rhythm, normal S1, S2, no murmurs. no rubs, clicks or gallops  Abdomen - soft, non-tender, nondistended. No obvious pulsation or mass. No abdominal bruits. Hypoactive bowel sounds. Midline abdominal scar with incisional hernia  Extremities - no pedal edema noted  Rectal exam- deferred  Skin - warm and dry.        Lab:     Results     Procedure Component Value Units Date/Time    Dextrose Stick Glucose [[id number]]  (Abnormal) Collected:  10/15/18 0805    Specimen:  Blood Updated:  10/15/18 0832     Glucose POCT 190 mg/dL     Lactate dehydrogenase [[id number]]  (Abnormal) Collected:  10/15/18 0633    Specimen:  Plasma Updated:  10/15/18 0826     LDH 286 U/L     Lipid panel [[id number]]   (Abnormal) Collected:  10/15/18 0633    Specimen:  Plasma Updated:  10/15/18 0826     Cholesterol 172 mg/dL      Triglycerides 97 mg/dL      HDL 70 mg/dL      LDL Calculated 83 mg/dL      Coronary Heart Disease Risk 2.46     VLDL 19    Phosphorus [[id number]]  (Abnormal) Collected:  10/15/18 0633    Specimen:  Plasma Updated:  10/15/18 0721     Phosphorus 1.6 mg/dL     Hepatic function panel (LFT) [[id number]]  (Abnormal) Collected:  10/15/18 0633    Specimen:  Plasma Updated:  10/15/18 0721     Protein, Total 7.1 gm/dL      Albumin 3.3 gm/dL      Alkaline Phosphatase 247 U/L      ALT 30 U/L      AST (SGOT) 71 U/L      Bilirubin, Total 1.9 mg/dL      Bilirubin Direct 1.3 mg/dL      Albumin/Globulin Ratio 0.87 Ratio      Globulin 3.8 gm/dL     Basic  Metabolic Panel [[id number]]  (Abnormal) Collected:  10/15/18 0633    Specimen:  Plasma Updated:  10/15/18 0721     Sodium 128 mMol/L      Potassium 3.7 mMol/L      Chloride 93 mMol/L      CO2 24 mMol/L      Calcium 8.0 mg/dL      Glucose 086 mg/dL      Creatinine 5.78 mg/dL      BUN 12 mg/dL      Anion Gap 46.9 mMol/L      BUN / Creatinine Ratio 11.7 Ratio      EGFR 60 mL/min/1.60m2      Osmolality Calculated 263 mOsm/kg     Magnesium [[id number]] Collected:  10/15/18 0633    Specimen:  Plasma Updated:  10/15/18 0721     Magnesium 2.1 mg/dL     CBC and differential [[id number]]  (Abnormal) Collected:  10/15/18 0633    Specimen:  Blood Updated:  10/15/18 0720     WBC 2.5 K/cmm      RBC 3.76 M/cmm      Hemoglobin 12.6 gm/dL      Hematocrit 53.6 %      MCV 102 fL      MCH 34 pg      MCHC 33 gm/dL      RDW 64.4 %      PLT CT 70 K/cmm      MPV 8.2 fL      Neutrophils % 75.2 %      Lymphocytes 9.2 %      Monocytes 15.4 %      Eosinophils % 0.2 %      Basophils % 0.2 %      Neutrophils Absolute 1.9 K/cmm      Lymphocytes Absolute 0.2 K/cmm      Monocytes Absolute 0.4 K/cmm      Eosinophils Absolute 0.0 K/cmm      Basophils Absolute 0.0 K/cmm     Lipase [[id number]]  (Abnormal)  Collected:  10/15/18 0633    Specimen:  Plasma Updated:  10/15/18 0711     Lipase 886 U/L     Calcium, ionized [[id number]]  (Abnormal) Collected:  10/15/18 0633    Specimen:  Blood Updated:  10/15/18 0657     Calcium, Ionized 4.12 mg/dL     Dextrose Stick Glucose [[id number]]  (Abnormal) Collected:  10/15/18 0302    Specimen:  Blood Updated:  10/15/18 0328     Glucose POCT 298 mg/dL     Basic Metabolic Panel [[id number]]  (Abnormal) Collected:  10/15/18 0209    Specimen:  Plasma Updated:  10/15/18 0240     Sodium 130 mMol/L      Potassium 3.6 mMol/L      Chloride 92 mMol/L      CO2 18 mMol/L      Calcium 7.8 mg/dL      Glucose 093 mg/dL      Creatinine 2.35 mg/dL      BUN 13 mg/dL      Anion Gap 57.3 mMol/L      BUN / Creatinine Ratio 15.5 Ratio      EGFR 77 mL/min/1.12m2      Osmolality Calculated 269 mOsm/kg     Magnesium [[id number]] Collected:  10/15/18 0209    Specimen:  Plasma Updated:  10/15/18 0240     Magnesium 2.5 mg/dL     i-Stat CG4 Arterial CartrIDge [[id number]]  (Abnormal) Collected:  10/15/18 0210  Specimen:  Arterial Updated:  10/15/18 0215     pH, ISTAT 7.36     PO2, ISTAT 66 mm Hg      BE, ISTAT -4 mMol/L      HCO3, ISTAT 21.4 mMol/L      PCO2, ISTAT 37.7 mm Hg      O2 Sat, %, ISTAT 92 %      Room Number I-Stat 3540     i-STAT Allen's Test Pass     DELS, ISTAT Nasal Can     i-STAT FIO2 0.00 %      i-STAT Liters Per Minute 2 L/min      Sample I-Stat Arterial     Site I-Stat L Radial     TCO2 I-Stat 23 mMol/L      Lactic Acid I-Stat 0.6 mMol/L      Operator, ISTAT Operator: [id number] Tedd Sias YOLANDA RT    Dextrose Stick Glucose [[id number]]  (Abnormal) Collected:  10/14/18 2334    Specimen:  Blood Updated:  10/14/18 2350     Glucose POCT 137 mg/dL     Dextrose Stick Glucose [[id number]]  (Abnormal) Collected:  10/14/18 2159    Specimen:  Blood Updated:  10/14/18 2226     Glucose POCT 113 mg/dL     Hemoglobin X3K [[id number]] Collected:  10/14/18 2027    Specimen:  Blood Updated:  10/14/18 2129     Hgb A1C, %  5.0 %     Comprehensive metabolic panel [[id number]]  (Abnormal) Collected:  10/14/18 2027    Specimen:  Plasma Updated:  10/14/18 2111     Sodium 130 mMol/L      Potassium 4.1 mMol/L      Chloride 96 mMol/L      CO2 8.1 mMol/L      Calcium 8.6 mg/dL      Glucose 95 mg/dL      Creatinine 4.25 mg/dL      BUN 15 mg/dL      Protein, Total 8.0 gm/dL      Albumin 3.6 gm/dL      Alkaline Phosphatase 314 U/L      ALT 36 U/L      AST (SGOT) 85 U/L      Bilirubin, Total 1.5 mg/dL      Albumin/Globulin Ratio 0.82 Ratio      BUN / Creatinine Ratio 14.6 Ratio      Anion Gap 30.0 mMol/L      EGFR 60 mL/min/1.53m2      Globulin 4.4 gm/dL      Osmolality Calculated 261 mOsm/kg     Osmolality [[id number]]  (Abnormal) Collected:  10/14/18 2027    Specimen:  Blood Updated:  10/14/18 2108     Osmolality Measured 315 mOsm/kg     Magnesium [[id number]]  (Abnormal) Collected:  10/14/18 2027    Specimen:  Plasma Updated:  10/14/18 2102     Magnesium 1.4 mg/dL     Phosphorus [[id number]] Collected:  10/14/18 2027    Specimen:  Plasma Updated:  10/14/18 2102     Phosphorus 3.7 mg/dL     CBC and differential [[id number]]  (Abnormal) Collected:  10/14/18 2027    Specimen:  Blood Updated:  10/14/18 2054     WBC 10.8 K/cmm      RBC 3.62 M/cmm      Hemoglobin 12.6 gm/dL      Hematocrit 01.0 %      MCV 102 fL      MCH 35 pg  MCHC 34 gm/dL      RDW 45.4 %      PLT CT 104 K/cmm      MPV 7.3 fL      Neutrophils % 87.8 %      Lymphocytes 5.3 %      Monocytes 6.3 %      Eosinophils % 0.4 %      Basophils % 0.2 %      Neutrophils Absolute 9.5 K/cmm      Lymphocytes Absolute 0.6 K/cmm      Monocytes Absolute 0.7 K/cmm      Eosinophils Absolute 0.0 K/cmm      Basophils Absolute 0.0 K/cmm     Dextrose Stick Glucose [[id number]]  (Abnormal) Collected:  10/14/18 2021    Specimen:  Blood Updated:  10/14/18 2038     Glucose POCT 105 mg/dL     i-Stat CG4 Arterial CartrIDge [[id number]]  (Abnormal) Collected:  10/14/18 2022    Specimen:  Arterial Updated:   10/14/18 2029     pH, ISTAT 7.20     PO2, ISTAT 75 mm Hg      BE, ISTAT       Unable to calculate result due to analyte out of the analytical measurement range     mMol/L     HCO3, ISTAT <14.1 mMol/L      PCO2, ISTAT 28.5 mm Hg      O2 Sat, %, ISTAT       Unable to calculate result due to analyte out of the analytical measurement range     %     Room Number I-Stat 3540     i-STAT Allen's Test Pass     DELS, ISTAT Room Air     i-STAT FIO2 21.00 %      Sample I-Stat Arterial     Site I-Stat R Radial     TCO2 I-Stat       Unable to calculate result due to analyte out of the analytical measurement range     mMol/L     Lactic Acid I-Stat 2.0 mMol/L      Operator, ISTAT Operator: [id number] HARDEN LOGAN RT    Prothrombin time/INR [[id number]] Collected:  10/14/18 1519    Specimen:  Blood Updated:  10/14/18 2014     PT 11.5 sec      PT INR 1.1    Dextrose Stick Glucose [[id number]]  (Abnormal) Collected:  10/14/18 1925    Specimen:  Blood Updated:  10/14/18 1943     Glucose POCT 178 mg/dL     Urine Osmolality [[id number]] Collected:  10/14/18 1910    Specimen:  Urine, Random Updated:  10/14/18 1930     Urine Osmolality 503 mOsm/kg     Dextrose Stick Glucose [[id number]]  (Abnormal) Collected:  10/14/18 1838    Specimen:  Blood Updated:  10/14/18 1854     Glucose POCT 48 mg/dL     Beta-Hydroxybutyrate [[id number]]  (Abnormal) Collected:  10/14/18 1519    Specimen:  Plasma Updated:  10/14/18 1741     Betahydroxybutyrate 10.02 mMol/L     Venous Blood Gas [[id number]]  (Abnormal) Collected:  10/14/18 1712    Specimen:  Blood Updated:  10/14/18 1740     pH, Ven 7.18 pH      pCO2, Venous 29.3 mm Hg      pO2, Venous 60 mm Hg      HCO3, Ven 11.0 mEq/L      % O2 Sat, Ven 83.9 %  Ethanol (Alcohol) Level [[id number]]  (Abnormal) Collected:  10/14/18 1519    Specimen:  Plasma Updated:  10/14/18 1715     Alcohol 168 mg/dL     Lipase [[id number]]  (Abnormal) Collected:  10/14/18 1519    Specimen:  Plasma Updated:  10/14/18 1655     Lipase 1,771  U/L     Comprehensive metabolic panel [[id number]]  (Abnormal) Collected:  10/14/18 1519    Specimen:  Plasma Updated:  10/14/18 1650     Sodium 130 mMol/L      Potassium 4.3 mMol/L      Chloride 94 mMol/L      CO2 6.6 mMol/L      Calcium 9.5 mg/dL      Glucose 47 mg/dL      Creatinine 0.86 mg/dL      BUN 16 mg/dL      Protein, Total 9.1 gm/dL      Albumin 4.2 gm/dL      Alkaline Phosphatase 342 U/L      ALT 42 U/L      AST (SGOT) 102 U/L      Bilirubin, Total 1.8 mg/dL      Albumin/Globulin Ratio 0.86 Ratio      BUN / Creatinine Ratio 14.0 Ratio      Anion Gap 33.7 mMol/L      EGFR 53 mL/min/1.63m2      Globulin 4.9 gm/dL      Osmolality Calculated 259 mOsm/kg     CBC and differential [[id number]]  (Abnormal) Collected:  10/14/18 1519    Specimen:  Blood Updated:  10/14/18 1558     WBC 13.0 K/cmm      RBC 3.95 M/cmm      Hemoglobin 13.5 gm/dL      Hematocrit 52.8 %      MCV 102 fL      MCH 34 pg      MCHC 34 gm/dL      RDW 41.3 %      PLT CT 130 K/cmm      MPV 7.9 fL      Neutrophils % 86.1 %      Lymphocytes 8.2 %      Monocytes 5.2 %      Eosinophils % 0.1 %      Basophils % 0.3 %      Neutrophils Absolute 11.2 K/cmm      Lymphocytes Absolute 1.1 K/cmm      Monocytes Absolute 0.7 K/cmm      Eosinophils Absolute 0.0 K/cmm      Basophils Absolute 0.0 K/cmm         Labs Reviewed.     Radiology:     Radiology Results (24 Hour)     ** No results found for the last 24 hours. **        Radiological Procedure within last 24 hours reviewed.    Signed by: Margaretann Loveless, PA

## 2018-10-15 NOTE — Progress Notes (Signed)
Readmission Risk  [facility] - CRITICAL CARE 4   Patient Name: [name]   Attending Physician: Virl Cagey*   Today's date:   10/15/2018 LOS: 1 days   Expected Discharge Date      Readmission Assessment:                                                              Discharge Planning  ReAdmit Risk Score: 12  Care Coordination with Palliative Care: (No)  Does the patient have perscription coverage?: (P) Yes(uses [facility] )  Utilize [facility]: No  Confirmed PCP with Pt: Yes  Confirmed PCP name: no PCP  Confirm Transport to F/U Appt.: Self/Private Vehicle/Friend  Social Work Referral: Not Applicable  Anticipated Home Health at [place]: No  Anticipated Placement at [place]: No  CM Comments: 10/15/18, RNCM (RW)- pancreatitis. IVF's/pain control/monitoring labs/supportive care. pt plans return home w/ room mate support. clinical course will determine d/c needs. RNCM following.    Clinic Referrals: Transition(appointment July 14th @ 10:30am, noted on d/c instruction sheet  )       IDPA:   Patient Type  Within 30 Days of Previous Admission?: (P) No  Healthcare Decisions  Interviewed:: (P) Patient  Orientation/Decision Making Abilities of Patient: (P) Alert and Oriented x3, able to make decisions  Advance Directive: (P) Patient does not have advance directive  Prior to admission  Prior level of function: (P) Independent with ADLs, Ambulates independently  Type of Residence: (P) Private residence  Living Arrangements: (P) Friends  How do you get to your MD appointments?: (P) self  How do you get your groceries?: (P) self  Who fixes your meals?: (P) self  Who does your laundry?: (P) self  Who picks up your prescriptions?: (P) self  Dressing: (P) Independent  Grooming: (P) Independent  Feeding: (P) Independent  Bathing: (P) Independent  Toileting: (P) Independent  Home Care/Community Services: (P) None  Adult Protective Services (APS) involved?: (P) No  Discharge Planning  Support Systems:  (P) Friends/neighbors  Patient expects to be discharged to:: (P) home   Anticipated  plan discussed with:: (P) Same as interviewed  Potential barriers to discharge:: (P) Noncompliant patient  Mode of transportation:: (P) Private car (friend)  Does the patient have perscription coverage?: (P) Yes(uses [facility] )  Consults/Providers  PT Evaluation Needed: No  OT Evalulation Needed: No  SLP Evaluation Needed: No  Correct PCP listed in Epic?: (P) No (comment)  PCP  PCP on file was verified as the current PCP?: (P) Patient/family states they do not have a PCP   30 Day Readmission:       Provider Notifications:      Lilyan Punt, RN, BSN, NCM-BC  Case Manager  [facility]  Ph [phone]  Fx [phone]

## 2018-10-15 NOTE — Consults (Addendum)
[facility]  MEDICINE CONSULT NOTE      Patient: [name]  Date: 10/14/2018   DOB: 12-22-1960  Admission Date: 10/14/2018   MRN: [id number]  Attending: Virl Cagey*       Reason for Consultation: Floor transfer  Requesting Physician: Virl Cagey*  Consulting Physician: Ilda Mori   History Gathered From: Patient     PRIMARY CARE MD: Oneita Hurt, None, MD    HISTORY AND PHYSICAL     Latash Nouri is a 58 y.o. female with the following    Past Medical History:   Diagnosis Date    Ascites     Asthma without status asthmaticus     Clostridium difficile colitis     Disorder of liver     multifocal cystic irregularities    DVT (deep venous thrombosis)     Gastroesophageal reflux disease     H/O ETOH abuse     sober 2 years until 03/2014    Hypertension     Hypokalemia     Pancreatitis     S/P IVC filter        Past Surgical History:   Procedure Laterality Date    CHOLECYSTECTOMY      COLONOSCOPY  12/26/2012    Procedure: COLONOSCOPY;  Surgeon: Gwenith Spitz, MD;  Location: [facility];  Service: Gastroenterology;  Laterality: N/A;    COLONOSCOPY, POLYPECTOMY  12/26/2012    Procedure: COLONOSCOPY, POLYPECTOMY;  Surgeon: Gwenith Spitz, MD;  Location: [facility];  Service: Gastroenterology;  Laterality: N/A;    DEBRIDEMENT & IRRIGATION, WOUND CLOSURE Right 08/29/2018    Procedure: WASHOUT AND CLOSURE OF TRAUMATIC WOUND, RIGHT FOREARM;  Surgeon: Arnoldo Hooker, MD;  Location: [facility];  Service: General;  Laterality: Right;  Right forearm I&D poss. wound closure    PANCREATECTOMY  01/2011    partial    THORACENTESIS Right 2012    pl effusion        who was admitted to ICU yesterday.  Patient has underlying chronic alcohol abuse, hypertension, asthma was admitted yesterday with acute alcoholic pancreatitis with alcoholic ketoacidosis to ICU.  Patient was managed conservatively got IV hydration.  Acidosis improved.  Hemoglobin/hematocrit  11.7/34.2 WBC 2.4 platelets 64  Chest x-ray unremarkable.  Currently patient denies any chest pain shortness of breathing nausea vomiting or diarrhea.    Prior to Admission medications    Medication Sig Start Date End Date Taking? Authorizing Provider   albuterol (PROVENTIL HFA;VENTOLIN HFA) 108 (90 Base) MCG/ACT inhaler Inhale 2 puffs into the lungs every 6 (six) hours as needed for Wheezing   Yes [provider]   amLODIPine (NORVASC) 5 MG tablet Take 1 tablet (5 mg total) by mouth daily 08/08/18  Yes Shibeshi, Woldecherkos, MD   furosemide (LASIX) 20 MG tablet Take 1 tablet (20 mg total) by mouth daily as needed (for legs edema or abdominal swelling) 08/07/18  Yes Shibeshi, Woldecherkos, MD   ibuprofen (ADVIL,MOTRIN) 200 MG tablet Take 400 mg by mouth every 6 (six) hours as needed for Pain.       Yes [provider]   Multiple Vitamins-Minerals (MULTIVITAMIN WITH MINERALS) tablet Take 1 tablet by mouth every morning.      Yes [provider]   pancrelipase, Lip-Prot-Amyl, (CREON 24000) 24000-76000 units Cap DR Particles Take 72,000 units of lipase by mouth 3 (three) times daily with meals   Yes [provider]   potassium chloride (K-DUR,KLOR-CON) 20 MEQ tablet Take 1 tablet (  20 mEq total) by mouth 2 (two) times daily.  Patient taking differently: Take 20 mEq by mouth daily    07/25/14  Yes Darra Lis, MD   sodium bicarbonate 650 MG tablet Take 1 tablet (650 mg total) by mouth 3 (three) times daily 08/07/18  Yes Shibeshi, Ronette Deter, MD   spironolactone (ALDACTONE) 50 MG tablet Take 1 tablet (50 mg total) by mouth daily 08/07/18  Yes Shibeshi, Woldecherkos, MD   traMADol (ULTRAM) 50 MG tablet Take 1 tablet (50 mg total) by mouth every 6 (six) hours as needed for Pain 08/07/18  Yes Shibeshi, Woldecherkos, MD   vitamin B-1 (THIAMINE) 100 MG tablet Take 100 mg by mouth daily   Yes [provider]   naloxone (NARCAN) 4 MG/0.1ML nasal spray 1 spray intranasally. If pt  does not respond or relapses into respiratory depression call 911. Give additional doses every 2-3 min. 08/07/18   Obie Dredge, MD   ondansetron (ZOFRAN-ODT) 4 MG disintegrating tablet Take 1 tablet (4 mg total) by mouth every 8 (eight) hours as needed for Nausea 08/07/18   Obie Dredge, MD       Allergies   Allergen Reactions    Morphine Nausea Only       Family History:   Family History   Adopted: Yes           Social History     Tobacco Use    Smoking status: Current Every Day Smoker     Packs/day: 0.50     Years: 30.00     Pack years: 15.00     Types: Cigarettes    Smokeless tobacco: Never Used   Substance Use Topics    Alcohol use: Yes     Comment: occasional    Drug use: No       REVIEW OF SYSTEMS     Except pertinent positives and negatives as mentioned in HPI and past medical history all other review of systems including constitutional, eyes, ENT,cardiovascular, respiratory, gastrointestinal, genitourinary, musculoskeletal, neurological, allergic, immunological, hematological  integumentary and lymphatic are unremarkable.     PHYSICAL EXAM     Vital Signs (most recent): BP (!) 123/99    Pulse 95    Temp 97.9 F (36.6 C) (Oral)    Resp (!) 25    Ht 1.676 m (5\' 6" )    Wt 71.3 kg (157 lb 3 oz)    SpO2 94%    BMI 25.37 kg/m     1) General appearance:  well developed,well nourished, in no apparent acute cardiorespiratory distress.     2) HEENT: Head is atraumatic and normocephalic. Pupils are equally reactive to light and accommodation. Extraocular muscles are intact. Patient has intact external auditory canal. No abnormal lesions or bleeding from nose. Oral mucosa moist with no pharyngeal congestion.     3) Neck: Supple. Trachea is central, no JVD or carotid bruit.     4) Resp: Clear to auscultation bilaterally, no wheezing. No use of accessory muscles     6) CVS: The S1, S2 normal. Regular rate and rhythm.No murmur, no thrill.    7) GI:  soft, incisional hernia noted no evidence of  obstruction or tenderness easily reducible non tender, non distended, no palpable mass, no hepato-splenomegaly appreciated. Bowel sounds audible.     8) Musculoskeletal: Patient has 5/5 motor strength in bilateral upper extremities as well as bilateral LE. No pitting edema legs. No clubbing or cyanosis in bilateral lower extremities. No gross limitation of ROM of  all 4 extremities.     9) Neurological: Cranial nerves II-XII intact. Deep tendon reflexes 2+    10) Psychiatric: Alert and oriented x 3. Mood is appropriate.     11) Integumentary: warm with normal skin turgor, no rash, no indurated areas    12) Lymphatics: No lymphadenopathy in axillary, cervial and inguinal area.       LABS & IMAGING     Recent Labs   Lab 10/15/18  1100 10/15/18  0633 10/14/18  2027 10/14/18  1519   WBC 2.4* 2.5* 10.8 13.0*   RBC 3.34* 3.76* 3.62* 3.95   Hemoglobin 11.7* 12.6 12.6 13.5   Hematocrit 34.2* 38.4 36.9 40.2   MCV 102* 102* 102* 102*   PLT CT 68* 70* 104* 130       Recent Labs   Lab 10/15/18  1100 10/15/18  0633 10/15/18  0209 10/14/18  2027 10/14/18  1519   Sodium 131* 128* 130* 130* 130*   Potassium 4.1 3.7 3.6 4.1 4.3   Chloride 95* 93* 92* 96* 94*   CO2 26 24 18* 8.1* 6.6*   BUN 10 12 13 15 16    Creatinine 0.87 1.03 0.84 1.03 1.14   Glucose 158* 207* 236* 95 47*   Calcium 8.7 8.0* 7.8* 8.6 9.5   Magnesium 1.8 2.1 2.5 1.4*  --        Recent Labs   Lab 10/15/18  0633 10/14/18  2027 10/14/18  1519   ALT 30 36 42   AST (SGOT) 71* 85* 102*   Bilirubin, Total 1.9* 1.5* 1.8*   Bilirubin Direct 1.3*  --   --    Albumin 3.3* 3.6 4.2   Alkaline Phosphatase 247* 314* 342*             Recent Labs   Lab 10/14/18  1519   PT INR 1.1       Imaging Studies:   Xr Chest Ap Portable    Result Date: 10/15/2018  No acute cardiopulmonary findings given portable technique. ReadingStation:[id number]      Microbiology and/or telemetry results:   No cultures obtained  EKG done in the ER and reviewed by me showed       ASSESSMENT & PLAN     Temisha Murley is a 58 y.o. female with     1.  Acute alcohol induced pancreatitis  Continue aggressive IV hydration  Abdomen soft nontender hemodynamically stable  PRN pain medication nausea medication  2.  Chronic alcohol abuse complicated with alcoholic ketoacidosis resolved with IV hydration  CIWA protocol with PRN Ativan  Continue folic acid thiamine  Continue phenobarb for alcohol withdrawal prevention  Seizure precaution fall precaution  3.  Bicytopenia due to underlying alcohol abuse will monitor    4.  Intractable nausea vomiting currently tolerating clear liquid diet will continue  5.  Hypertension stable blood pressure will hold blood pressure medication for now  6.  History of asthma continue albuterol nebulization as needed    CODE Status: full code      DVT prophylaxis: Mechanical      I appreciate the opportunity to be involved in [place]'s care.     Signed,  Ilda Mori  10/15/2018 1:40 PM    CC: Virl Cagey* and Pcp, None, MD

## 2018-10-15 NOTE — Assessment & Plan Note (Signed)
A/P: Asymptomatic presumed secondary to chronic volume depletion and alcohol abuse  Monitor serum sodium.  Avoid hypotonic fluids.  Maintain serum sodium greater than 126 no indication for specific therapy at present time

## 2018-10-15 NOTE — Progress Notes (Addendum)
[facility]  Progress Note    Patient Name: [name]    Attending Physician: Virl Cagey*                                             LOS:  1 DAYS   Primary Care Physician: Pcp, None, MD        ROOM#: 3540/3540-A                                      Assessment     Acute on chronic alcoholic pancreatitis complicating alcoholic liver disease chronic alcoholism newly noted hyponatremia and thrombocytopenia.  Persistent alcoholic ketosis and metabolic acidosis      Interval Events / ICU Course    7/6 admitted to ICU for acute alcohol induced pancreatitis, acute alcohol intoxication and alcoholic ketoacidosis with profound hypoglycemia  7/7: Hemodynamically stable overnight.  Volume resuscitation undertaken.  Electrolyte replacements undertaken.  Minds protocol initiated but no acute withdrawal symptoms.  Mild worsening of thrombocytopenia and hyponatremia      Assessment & Plan      Patient Active Problem List   Diagnosis    History of pancreatitis    Essential hypertension    Tobacco use    ETOH abuse    Asthma    Pancreatitis    Chronic alcoholic pancreatitis    Acute on chronic pancreatitis    History of partial pancreatectomy    Alcoholic ketoacidosis    Dog bite    Hypertension    Gastroesophageal reflux disease    Asthma without status asthmaticus    Pancreatitis, alcoholic, acute    Hypoglycemia, unspecified    Metabolic acidosis with increased anion gap and accumulation of organic acids    Thrombocytopenia, unspecified    Hyponatremia    Hypomagnesemia   Respiratory  Asthma without status asthmaticus  Assessment & Plan  A/P: No active issues.  PRN bronchodilator therapy    Digestive  Chronic alcoholic pancreatitis  Assessment & Plan  A/P: Previous history of laparotomy for pancreatitis at [facility] 2012.  No details available    * Pancreatitis, alcoholic, acute  Assessment & Plan  A/P: Persistent mild hyperamylasemia.  Serum lipid profile  GI consultation for  consideration of MRCP  Volume resuscitation per protocol    Endocrine  Hypoglycemia, unspecified  Assessment & Plan  A/P: Treated and resolved with volume expansion and D5 replacement.  Nutritional assessment with continuation of monitoring of serum glucose    Other  Hypomagnesemia  Assessment & Plan  A/P: Secondary to chronic alcohol abuse  No documented complications  Potassium and magnesium replacement underway    Hyponatremia  Assessment & Plan  A/P: Asymptomatic presumed secondary to chronic volume depletion and alcohol abuse  Monitor serum sodium.  Avoid hypotonic fluids.  Maintain serum sodium greater than 126 no indication for specific therapy at present time    Thrombocytopenia, unspecified  Assessment & Plan  A/P: Thrombocytopenia on admission presumed secondary to alcoholic marrow suppression.  Monitor platelet count count.  no indication for platelet transfusion at present time.    No active bleeding.    Metabolic acidosis with increased anion gap and accumulation of organic acids  Assessment & Plan  A/P: As above    Alcoholic ketoacidosis  Assessment & Plan  A/P:  Resolution of anion gap acidosis with volume replacement and sodium bicarbonate replacement.  Further evaluation based on repeat laboratory    ETOH abuse  Assessment & Plan  A/P: Ongoing active alcohol abuse.  Continuation thiamine and folic acid supplementation  Continuation of minds protocol and observation/treatment for-alcohol withdrawal  Alcohol rehabilitation program when stable          General ICU Assessment / Plans - if applicable    Vascular access: Peripherals.  GI Prophylaxis: H2 blocker  Nutrition: Multivitamin/thiamine/folic acid supplement. Clear liquid diet  Sedation: PRN pain/nausea/anxiety control. MINDS protocol for ETOH withdrawal.  Foley Catheter: not needed  VTE Prophylaxis:   LMWH (prophylactic dose)    Other:  Code Status: NO CPR - SUPPORT OK      HISTORY / Subjective    58 year old female with history of chronic alcohol  abuse with subsequent chronic pancreatitis cirrhosis and recurrent metabolic acidosis.  Admitted with acute alcoholic intoxication and alcoholic ketoacidosis with profound hypoglycemia.  Hemodynamically stable after volume expansion    Review of Systems   Constitutional: Positive for malaise/fatigue and weight loss. Negative for chills, diaphoresis and fever.   HENT: Negative.    Eyes: Negative.    Respiratory: Negative for cough, hemoptysis, sputum production and shortness of breath.    Cardiovascular: Negative for chest pain, palpitations, orthopnea, claudication and leg swelling.   Gastrointestinal: Positive for abdominal pain, heartburn, nausea and vomiting. Negative for blood in stool, constipation, diarrhea and melena.   Genitourinary: Negative.    Musculoskeletal: Positive for back pain, myalgias and neck pain. Negative for joint pain.   Skin: Positive for rash. Negative for itching.   Neurological: Positive for dizziness, tingling, tremors, weakness and headaches. Negative for sensory change, speech change, focal weakness and seizures.   Endo/Heme/Allergies: Positive for polydipsia. Negative for environmental allergies. Bruises/bleeds easily.   Psychiatric/Behavioral: Positive for depression, memory loss and substance abuse. Negative for hallucinations and suicidal ideas. The patient is nervous/anxious and has insomnia.          PHYSICAL EXAM   Vitals: BP (!) 129/92    Pulse 100    Temp 97.9 F (36.6 C) (Oral)    Resp 12    Ht 1.676 m (5\' 6" )    Wt 71.3 kg (157 lb 3 oz)    SpO2 99%    BMI 25.37 kg/m    Admission Weight: Weight: 68 kg (150 lb)  Last Weight:   Wt Readings from Last 1 Encounters:   10/15/18 71.3 kg (157 lb 3 oz)         Body mass index is 25.37 kg/m.  I/O:     Intake/Output Summary (Last 24 hours) at 10/15/2018 0843  Last data filed at 10/15/2018 0752  Gross per 24 hour   Intake 7195.8 ml   Output 2600 ml   Net 4595.8 ml         Constitutional:  Well developed, well nourished, mild acute  distress, minimally toxic appearance   Eyes:  PERRL, conjunctiva normal anicteric  HENT:  Atraumatic, external ears normal, nose normal, oropharynx moist, no pharyngeal exudates. Neck- normal range of motion, no tenderness, supple   Respiratory:  No respiratory distress, normal breath sounds, no rales, no wheezing   Cardiovascular:  Normal rate, normal rhythm, no murmurs, no gallops, no rubs No jvd  GI:  Soft, nondistended, hypoactive bowel sounds, nontender, no organomegaly no rebound, no guarding.  Mild diffuse abdominal tenderness without rebound.  Extensive midline surgical incision scar.  A large  left sided abdominal wall incisional hernia measuring at least 8 x 12 cm.  There is no tender edge and no evidence of incarceration.  There is no erythema or warmth.  There is no rebound over the hernia.  GU:  No costovertebral angle tenderness   Musculoskeletal:  No edema, no tenderness, no deformities. Back- no tenderness  Integument:  Well hydrated, no rash multiple ecchymosis and telangectasia of chest wall  Lymphatic:  No lymphadenopathy noted   Neurologic:  Alert & oriented x 3, CN 2-12 normal, normal motor function, normal sensory function,  Minimal tremor no focal deficits noted   Psychiatric:  Speech and behavior appropriate No hallucinosis          Tubes/Lines/Airway             Patient Lines/Drains/Airways Status    Active PICC Line / CVC Line / PIV Line / Drain / Airway / Intraosseous Line / Epidural Line / ART Line / Line / Wound / Pressure Ulcer / NG/OG Tube     Name:   Placement date:   Placement time:   Site:   Days:    Peripheral IV 10/14/18 Anterior;Left Forearm   10/14/18    2050    Forearm   less than 1    Midline IV 10/14/18 Anterior;Left Upper Arm   10/14/18    2313    Upper Arm   less than 1    Wound 08/06/18 Other (Comment) Arm   08/06/18    0045    Arm   70    Wound 08/30/18 Surgical Incision Arm Right   08/30/18    0104    Arm   46                    Labs Reviewed:    CBC:   Recent Labs   Lab  10/15/18  0633 10/14/18  2027 10/14/18  1519   WBC 2.5* 10.8 13.0*   RBC 3.76* 3.62* 3.95   Hemoglobin 12.6 12.6 13.5   Hematocrit 38.4 36.9 40.2   MCV 102* 102* 102*   PLT CT 70* 104* 130     Chemistry:   Recent Labs   Lab 10/15/18  0633 10/15/18  0209 10/14/18  2027 10/14/18  1519   Sodium 128* 130* 130* 130*   Potassium 3.7 3.6 4.1 4.3   Chloride 93* 92* 96* 94*   CO2 24 18* 8.1* 6.6*   BUN 12 13 15 16    Creatinine 1.03 0.84 1.03 1.14   Glucose 207* 236* 95 47*   Calcium 8.0* 7.8* 8.6 9.5   Magnesium 2.1 2.5 1.4*  --      LFTs:   Recent Labs   Lab 10/15/18  0633 10/14/18  2027 10/14/18  1519   ALT 30 36 42   AST (SGOT) 71* 85* 102*   Bilirubin, Total 1.9* 1.5* 1.8*   Bilirubin Direct 1.3*  --   --    Albumin 3.3* 3.6 4.2   Alkaline Phosphatase 247* 314* 342*     Cardiac Enzymes:     Coags:   Recent Labs   Lab 10/14/18  1519   PT INR 1.1   PT 11.5     POC Glucose:  Recent Labs     10/15/18  0805 10/15/18  0302 10/14/18  2334 10/14/18  2159 10/14/18  2021   Glucose POCT 190* 298* 137* 113* 105*     ABG:   Recent Labs  10/15/18  0210   BE, ISTAT -4   DELS, ISTAT Nasal Can   HCO3, ISTAT 21.4   O2 Sat, %, ISTAT 92*   pH, ISTAT 7.36   PO2, ISTAT 66*   TCO2 I-Stat 23*   PCO2, ISTAT 37.7       Other:    RADIOLOGY / IMAGING   Imaging personally reviewed by me, including:      CXR ordered       ATTESTATION & BILLING      Patient's condition and plan discussed with: patient and bedside nurse    Billing:  Level 3 Subsequent 99233/ >35 minutes      Signed by: Virl Cagey, MD   cc:Pcp, None, MD

## 2018-10-15 NOTE — Assessment & Plan Note (Signed)
A/P: Secondary to chronic alcohol abuse  No documented complications  Potassium and magnesium replacement underway

## 2018-10-15 NOTE — Assessment & Plan Note (Signed)
A/P: Previous history of laparotomy for pancreatitis at [facility] 2012.  No details available

## 2018-10-15 NOTE — Nursing Progress Note (Signed)
NURSE NOTE SUMMARY  [facility] - CRITICAL CARE 4   Patient Name: [name]   Attending Physician: Rosezella Florida*   Today's date:   10/15/2018 LOS: 1 days   Shift Summary:                                                              [phone]- report received from prior nurse. Care assumed  0610- able to ambulate to bsd commode with supervision assist. Tolerated well. C/o abd pain. Maintains NPO status.   0630- C/O nausea and abd pain. Medicated with prn zofran and dilaudid as ordered.    Provider Notifications:      Rapid Response Notifications:  Mobility:      PMP Activity: Step 6 - Walks in Room (10/14/2018  8:25 PM)     Weight tracking:  Family Dynamic:   Last 3 Weights for the past 72 hrs (Last 3 readings):   Weight   10/15/18 0500 71.3 kg (157 lb 3 oz)   10/14/18 1959 68.1 kg (150 lb 2.1 oz)   10/14/18 1843 68 kg (150 lb)             Recent Vitals Last Bowel Movement   BP: (!) 131/100 (10/15/2018  6:00 AM)  Heart Rate: 97 (10/15/2018  6:00 AM)  Temp: 97.9 F (36.6 C) (10/15/2018  3:07 AM)  Resp Rate: 14 (10/15/2018  6:00 AM)  Height: 1.676 m (5\' 6" ) (10/14/2018  7:59 PM)  Weight: 71.3 kg (157 lb 3 oz) (10/15/2018  5:00 AM)  SpO2: 98 % (10/15/2018  6:00 AM)   No data recorded

## 2018-10-15 NOTE — Progress Notes (Signed)
[facility] Physician - Brief Progress Note   PERMANENT   10/15/2018 02:35      Advanced ICU Care   [facility] [place] - CCU 4 - 12 - [place], [place] ([facility])      [name], [name]      Date of Service 10/15/2018 02:35      HPI/Events of Note Patient with pancreatitis   C/o abdominal and back pain and ersistent nausea and RN informed Dilaudid/Zofran ordered  but not due yet  I have increased frequency of PRN Dilaudid (to q2 hours) and Zofran (to q 4hours)         Interventions Intermediate-Pain - evaluation and management   Minor-Routine modifications to care plan (e.g. PRN medications for pain, fever)            Electronically Signed by: Theodoro Kalata (MD) on 10/15/2018 02:37

## 2018-10-15 NOTE — Progress Notes (Signed)
10/15/18 0218   Blood Draw / Analysis   Blood analysis done by RT? Yes   $ Arterial Puncture Done? Inpatient   ABG Site Left;Radial   Modified allen test done Positive, collateral circulation   ABG Site held With pressure and taped   Adverse Reactions None   Performing Departments   Exlectrolyte panel performing department formula [id number]   ABG puncture performing department formula [id number]

## 2018-10-15 NOTE — Discharge Instr - AVS First Page (Addendum)
Appointment at [facility] on July 14th @10 :30am to help you get established with a primary care doctor.  [facility] is located at [address], [address], [place] [address]  Call [phone] if unable to keep appointment.       We strongly recommend quitting alcohol consumption.    Please come to emergency immediately if you have any questions or concerns.

## 2018-10-15 NOTE — Assessment & Plan Note (Signed)
A/P: Persistent mild hyperamylasemia.  Serum lipid profile  GI consultation for consideration of MRCP  Volume resuscitation per protocol

## 2018-10-15 NOTE — Assessment & Plan Note (Signed)
A/P: As above

## 2018-10-15 NOTE — Assessment & Plan Note (Signed)
A/P: Treated and resolved with volume expansion and D5 replacement.  Nutritional assessment with continuation of monitoring of serum glucose

## 2018-10-15 NOTE — Progress Notes (Signed)
NURSE NOTE SUMMARY  [facility] - MED TELEMETRY STEP-DOWN   Patient Name: [name]   Attending Physician: Ilda Mori, MD   Today's date:   10/15/2018 LOS: 1 days   Shift Summary:                                                              Assumed care of patient. Patient in bed with wheels locked and bed in lowest position. Bedside report from off going RN. Bed alarm on and call bell in reach. IV and tele intact. No needs expressed at this time. Will continue to monitor. LR @ 200 ml/hr infusing.   Provider Notifications:      Rapid Response Notifications:  Mobility:      PMP Activity: Step 5 - Chair;Step 6 - Walks in Room (10/15/2018  7:50 AM)     Weight tracking:  Family Dynamic:   Last 3 Weights for the past 72 hrs (Last 3 readings):   Weight   10/15/18 1914 74.8 kg (164 lb 14.5 oz)   10/15/18 0500 71.3 kg (157 lb 3 oz)   10/14/18 1959 68.1 kg (150 lb 2.1 oz)             Recent Vitals Last Bowel Movement   BP: 128/77 (10/15/2018  7:14 PM)  Heart Rate: 88 (10/15/2018  7:14 PM)  Temp: 98.1 F (36.7 C) (10/15/2018  7:14 PM)  Resp Rate: 19 (10/15/2018  7:14 PM)  Height: 1.676 m (5\' 6" ) (10/14/2018  7:59 PM)  Weight: 74.8 kg (164 lb 14.5 oz) (10/15/2018  7:14 PM)  SpO2: 95 % (10/15/2018  7:14 PM)   No data recorded

## 2018-10-15 NOTE — Progress Notes (Signed)
[facility] Physician - Brief Progress Note   PERMANENT   10/15/2018 04:21      Advanced ICU Care   [facility] [place] - CCU 4 - 12 - [place], [place] ([facility])      Digeronimo, Nerea L.      Date of Service 10/15/2018 04:21      HPI/Events of Note RN called and informed patient initially came with BS 47 and is on  D5NS and most recent BS is 298  IVF changed from D5NS to NS and I have also reduced rate from 250 ml/hr to 200 ml/hr         Interventions Intermediate-Hyperglycemia - evaluation and treatment            Electronically Signed by: Theodoro Kalata (MD) on 10/15/2018 04:23

## 2018-10-16 LAB — CBC AND DIFFERENTIAL
Basophils %: 0.4 % (ref 0.0–3.0)
Basophils Absolute: 0 10*3/uL (ref 0.0–0.3)
Eosinophils %: 2.7 % (ref 0.0–7.0)
Eosinophils Absolute: 0.1 10*3/uL (ref 0.0–0.8)
Hematocrit: 31 % — ABNORMAL LOW (ref 36.0–48.0)
Hemoglobin: 10.3 gm/dL — ABNORMAL LOW (ref 12.0–16.0)
Lymphocytes Absolute: 0.7 10*3/uL (ref 0.6–5.1)
Lymphocytes: 20.9 % (ref 15.0–46.0)
MCH: 34 pg (ref 28–35)
MCHC: 33 gm/dL (ref 32–36)
MCV: 104 fL — ABNORMAL HIGH (ref 80–100)
MPV: 8.7 fL (ref 6.0–10.0)
Monocytes Absolute: 0.5 10*3/uL (ref 0.1–1.7)
Monocytes: 15 % (ref 3.0–15.0)
Neutrophils %: 61 % (ref 42.0–78.0)
Neutrophils Absolute: 2 10*3/uL (ref 1.7–8.6)
PLT CT: 53 10*3/uL — ABNORMAL LOW (ref 130–440)
RBC: 2.98 10*6/uL — ABNORMAL LOW (ref 3.80–5.00)
RDW: 14.8 % — ABNORMAL HIGH (ref 11.0–14.0)
WBC: 3.3 10*3/uL — ABNORMAL LOW (ref 4.0–11.0)

## 2018-10-16 LAB — BASIC METABOLIC PANEL
Anion Gap: 11.5 mMol/L (ref 7.0–18.0)
Anion Gap: 9.7 mMol/L (ref 7.0–18.0)
Anion Gap: 10.3 mMol/L (ref 7.0–18.0)
BUN / Creatinine Ratio: 10.8 Ratio (ref 10.0–30.0)
BUN / Creatinine Ratio: 11.7 Ratio (ref 10.0–30.0)
BUN / Creatinine Ratio: 13.4 Ratio (ref 10.0–30.0)
BUN: 8 mg/dL (ref 7–22)
BUN: 9 mg/dL (ref 7–22)
BUN: 11 mg/dL (ref 7–22)
CO2: 25 mMol/L (ref 20–30)
CO2: 24 mMol/L (ref 20–30)
CO2: 27 mMol/L (ref 20–30)
Calcium: 8.2 mg/dL — ABNORMAL LOW (ref 8.5–10.5)
Calcium: 7.9 mg/dL — ABNORMAL LOW (ref 8.5–10.5)
Calcium: 8.1 mg/dL — ABNORMAL LOW (ref 8.5–10.5)
Chloride: 96 mMol/L — ABNORMAL LOW (ref 98–110)
Chloride: 98 mMol/L (ref 98–110)
Chloride: 96 mMol/L — ABNORMAL LOW (ref 98–110)
Creatinine: 0.74 mg/dL (ref 0.60–1.20)
Creatinine: 0.77 mg/dL (ref 0.60–1.20)
Creatinine: 0.82 mg/dL (ref 0.60–1.20)
EGFR: 90 mL/min/{1.73_m2} (ref 60–150)
EGFR: 85 mL/min/{1.73_m2} (ref 60–150)
EGFR: 79 mL/min/{1.73_m2} (ref 60–150)
Glucose: 119 mg/dL — ABNORMAL HIGH (ref 71–99)
Glucose: 122 mg/dL — ABNORMAL HIGH (ref 71–99)
Glucose: 112 mg/dL — ABNORMAL HIGH (ref 71–99)
Osmolality Calculated: 258 mOsm/kg — ABNORMAL LOW (ref 275–300)
Osmolality Calculated: 257 mOsm/kg — ABNORMAL LOW (ref 275–300)
Osmolality Calculated: 261 mOsm/kg — ABNORMAL LOW (ref 275–300)
Potassium: 3.5 mMol/L (ref 3.5–5.3)
Potassium: 3.7 mMol/L (ref 3.5–5.3)
Potassium: 3.3 mMol/L — ABNORMAL LOW (ref 3.5–5.3)
Sodium: 129 mMol/L — ABNORMAL LOW (ref 136–147)
Sodium: 128 mMol/L — ABNORMAL LOW (ref 136–147)
Sodium: 130 mMol/L — ABNORMAL LOW (ref 136–147)

## 2018-10-16 LAB — VH DEXTROSE STICK GLUCOSE
Glucose POCT: 128 mg/dL — ABNORMAL HIGH (ref 71–99)
Glucose POCT: 119 mg/dL — ABNORMAL HIGH (ref 71–99)
Glucose POCT: 113 mg/dL — ABNORMAL HIGH (ref 71–99)
Glucose POCT: 114 mg/dL — ABNORMAL HIGH (ref 71–99)

## 2018-10-16 LAB — HEPATIC FUNCTION PANEL
ALT: 23 U/L (ref 0–55)
AST (SGOT): 59 U/L — ABNORMAL HIGH (ref 10–42)
Albumin/Globulin Ratio: 0.84 Ratio (ref 0.80–2.00)
Albumin: 2.6 gm/dL — ABNORMAL LOW (ref 3.5–5.0)
Alkaline Phosphatase: 200 U/L — ABNORMAL HIGH (ref 40–145)
Bilirubin Direct: 0.9 mg/dL — ABNORMAL HIGH (ref 0.0–0.3)
Bilirubin, Total: 1.3 mg/dL — ABNORMAL HIGH (ref 0.1–1.2)
Globulin: 3.1 gm/dL (ref 2.0–4.0)
Protein, Total: 5.7 gm/dL — ABNORMAL LOW (ref 6.0–8.3)

## 2018-10-16 LAB — MAGNESIUM: Magnesium: 1.8 mg/dL (ref 1.6–2.6)

## 2018-10-16 LAB — CALCIUM, IONIZED: Calcium, Ionized: 4.17 mg/dL — ABNORMAL LOW (ref 4.35–5.10)

## 2018-10-16 LAB — LIPASE: Lipase: 243 U/L — ABNORMAL HIGH (ref 8–78)

## 2018-10-16 MED ORDER — AMLODIPINE BESYLATE 5 MG PO TABS
5.0000 mg | ORAL_TABLET | Freq: Every day | ORAL | Status: DC
Start: 2018-10-16 — End: 2018-10-17
  Administered 2018-10-16 – 2018-10-17 (×2): 5 mg via ORAL
  Filled 2018-10-16 (×2): qty 1

## 2018-10-16 MED ORDER — PANCRELIPASE (LIP-PROT-AMYL) 24000-76000 UNITS PO CPEP
72000.00 [IU] | ORAL_CAPSULE | Freq: Three times a day (TID) | ORAL | Status: DC
Start: 2018-10-16 — End: 2018-10-17
  Administered 2018-10-16 – 2018-10-17 (×2): 72000 [IU] via ORAL
  Filled 2018-10-16 (×4): qty 3

## 2018-10-16 NOTE — Progress Notes (Signed)
Quick Doc  [facility] - MED TELEMETRY STEP-DOWN   Patient Name: [name]   Attending Physician: Cecilie Lowers, MD   Today's date:   10/16/2018 LOS: 2 days   Expected Discharge Date Expected Discharge Date: (tbd)    Quick  Assessment:                                                              ReAdmit Risk Score: 12    CM Comments: 10/16/2018 RNCM (AP) Pancreatitis. Transferred out of ICU. Diet to be advanced. MD indicated that pt will need OP resources for ETOH substance abuse. SW was consulted and will provide resource. Possible [place] later today or tomorrow. Plan for  is home with roommate support. RNCM/SW following    Physical Discharge Disposition: Home                                                                          Physical Discharge Disposition: Home       Provider Notifications:      Calla Kicks, RN, BSN  Case Manager  [phone]  [phone]: fax

## 2018-10-16 NOTE — UM Notes (Signed)
Autumn Poche, RN, BSN  Phone [phone]  Fax [phone]  Utilization Review  [facility]      [place]  August 02, 1960  REF [id number]  Clinical for 10/15/18    [facility]  MEDICINE CONSULT NOTE      Patient: [name]  Date: 10/14/2018   DOB: September 12, 1960  Admission Date: 10/14/2018   MRN: [id number]  Attending: Virl [name]*       Reason for Consultation: Floor transfer  Requesting Physician: [name]*  Consulting Physician: [name]   History Gathered From: Patient     PRIMARY CARE MD: [name], None, MD    HISTORY AND PHYSICAL     [name] is a 58 y.o. female with the following    Past Medical History        Past Medical History:   Diagnosis Date    Ascites     Asthma without status asthmaticus     Clostridium difficile colitis     Disorder of liver     multifocal cystic irregularities    DVT (deep venous thrombosis)     Gastroesophageal reflux disease     H/O ETOH abuse     sober 2 years until 03/2014    Hypertension     Hypokalemia     Pancreatitis     S/P IVC filter           Past Surgical History         Past Surgical History:   Procedure Laterality Date    CHOLECYSTECTOMY      COLONOSCOPY  12/26/2012    Procedure: COLONOSCOPY;  Surgeon: Autumn Spitz, MD;  [name];  Service: Gastroenterology;  Laterality: N/A;    COLONOSCOPY, POLYPECTOMY  12/26/2012    Procedure: COLONOSCOPY, POLYPECTOMY;  Surgeon: Autumn Spitz, MD;  Location: [name];  Service: Gastroenterology;  Laterality: N/A;    DEBRIDEMENT & IRRIGATION, WOUND CLOSURE Right 08/29/2018    Procedure: WASHOUT AND CLOSURE OF TRAUMATIC WOUND, RIGHT FOREARM;  Surgeon: Autumn Hooker, MD;  Location: [name];  Service: General;  Laterality: Right;  Right forearm I&D poss. wound closure    PANCREATECTOMY  01/2011    partial    THORACENTESIS Right 2012    pl effusion           who was admitted to ICU  yesterday.  Patient has underlying chronic alcohol abuse, hypertension, asthma was admitted yesterday with acute alcoholic pancreatitis with alcoholic ketoacidosis to ICU.  Patient was managed conservatively got IV hydration.  Acidosis improved.  Hemoglobin/hematocrit 11.7/34.2 WBC 2.4 platelets 64  Chest x-ray unremarkable.  Currently patient denies any chest pain shortness of breathing nausea vomiting or diarrhea.    Except pertinent positives and negatives as mentioned in HPI and past medical history all other review of systems including constitutional, eyes, ENT,cardiovascular, respiratory, gastrointestinal, genitourinary, musculoskeletal, neurological, allergic, immunological, hematological  integumentary and lymphatic are unremarkable.     PHYSICAL EXAM     Vital Signs (most recent): BP (!) 123/99    Pulse 95    Temp 97.9 F (36.6 C) (Oral)    Resp (!) 25    Ht 1.676 m (5\' 6" )    Wt 71.3 kg (157 lb 3 oz)    SpO2 94%    BMI 25.37 kg/m     1) General appearance:  well developed,well nourished, in no apparent acute cardiorespiratory distress.     2) HEENT: Head is atraumatic and normocephalic. Pupils  are equally reactive to light and accommodation. Extraocular muscles are intact. Patient has intact external auditory canal. No abnormal lesions or bleeding from nose. Oral mucosa moist with no pharyngeal congestion.     3) Neck: Supple. Trachea is central, no JVD or carotid bruit.     4) Resp: Clear to auscultation bilaterally, no wheezing. No use of accessory muscles     6) CVS: The S1, S2 normal. Regular rate and rhythm.No murmur, no thrill.    7) GI:  soft, incisional hernia noted no evidence of obstruction or tenderness easily reducible non tender, non distended, no palpable mass, no hepato-splenomegaly appreciated. Bowel sounds audible.     8) Musculoskeletal: Patient has 5/5 motor strength in bilateral upper extremities as well as bilateral LE. No pitting edema legs. No clubbing or cyanosis in  bilateral lower extremities. No gross limitation of ROM of all 4 extremities.     9) Neurological: Cranial nerves II-XII intact. Deep tendon reflexes 2+    10) Psychiatric: Alert and oriented x 3. Mood is appropriate.     11) Integumentary: warm with normal skin turgor, no rash, no indurated areas    12) Lymphatics: No lymphadenopathy in axillary, cervial and inguinal area.       LABS & IMAGING            Recent Labs   Lab 10/15/18  1100 10/15/18  0633 10/14/18  2027 10/14/18  1519   WBC 2.4* 2.5* 10.8 13.0*   RBC 3.34* 3.76* 3.62* 3.95   Hemoglobin 11.7* 12.6 12.6 13.5   Hematocrit 34.2* 38.4 36.9 40.2   MCV 102* 102* 102* 102*   PLT CT 68* 70* 104* 130               Recent Labs   Lab 10/15/18  1100 10/15/18  0633 10/15/18  0209 10/14/18  2027 10/14/18  1519   Sodium 131* 128* 130* 130* 130*   Potassium 4.1 3.7 3.6 4.1 4.3   Chloride 95* 93* 92* 96* 94*   CO2 26 24 18* 8.1* 6.6*   BUN 10 12 13 15 16    Creatinine 0.87 1.03 0.84 1.03 1.14   Glucose 158* 207* 236* 95 47*   Calcium 8.7 8.0* 7.8* 8.6 9.5   Magnesium 1.8 2.1 2.5 1.4*  --              Recent Labs   Lab 10/15/18  0633 10/14/18  2027 10/14/18  1519   ALT 30 36 42   AST (SGOT) 71* 85* 102*   Bilirubin, Total 1.9* 1.5* 1.8*   Bilirubin Direct 1.3*  --   --    Albumin 3.3* 3.6 4.2   Alkaline Phosphatase 247* 314* 342*         [name] is a 58 y.o. female with     1.  Acute alcohol induced pancreatitis  Continue aggressive IV hydration  Abdomen soft nontender hemodynamically stable  PRN pain medication nausea medication  2.  Chronic alcohol abuse complicated with alcoholic ketoacidosis resolved with IV hydration  CIWA protocol with PRN Ativan  Continue folic acid thiamine  Continue phenobarb for alcohol withdrawal prevention  Seizure precaution fall precaution  3.  Bicytopenia due to underlying alcohol abuse will monitor    4.  Intractable nausea vomiting currently tolerating clear liquid diet will continue  5.  Hypertension stable  blood pressure will hold blood pressure medication for now  6.  History of asthma continue albuterol nebulization as needed  CODE Status: full code      DVT prophylaxis: Mechanical    Mag oxide 400mg  PO q6, mag sulfate IVPB x1, phenobarbital IV q8, potassium chloride IVPB x4, IVF 200/hr [place] at 0747, dextrose 5%with sodium bicarbonate 246ml/hr for several hours then LR started at 200/hr, dilaudid 1mg  IV x2, dilaudid 2mg  IV x2, versed 2mg  IV x1, versed 3mg  IV x2, zofran IV x1

## 2018-10-16 NOTE — Consults (Signed)
Social Work Initial Assessment  [facility]   [address]   [place] [address]     Consult received from/reason:   Everlene Farrier, RN / Substance Abuse   Admitting diagnosis:    Pancreatitis, alcoholic, acute   Name/phone number of MPOA/caregiver with whom discharge plan was discussed:   Patient   Financial/Insurance status:    MEDICAID HMO/ANTHEM HEALTHKEEPERS PLUS MEDICAID     Possible barriers to discharge:   None   Social issues to be addressed:      Substance Abuse   Consult discussed with: ____  NCM  ____ Therapy    ____ Bedside nurse  ____ MD  ____ UR  _X___ Patient      ____ Other  ____ Nurse Navigator         Comments: Sw received consult, reviewed chart, spoke with RNCM.  Sw met with pt at bedside, pt pleasant and talkative.  Pt reports that she is very interested in resources but pt is not committed to an inpatient rehab facility and would like to look into intensive outpatient treatment options or support groups.  SW went through packet with pt and also pointed out the last column which shows what insurance they accept as pt is also worried about the cost.  Pt reports that she has been approved for Medicare.  SW also provided pt with resources for the Peer to Peer Tax adviser as well as their [employer].  Pt very appreciative, SW provided pt with contact information should she have additional questions or needs.       Devota Pace, BSW   Medical Social Worker   [employer] Social Worker  [phone]

## 2018-10-16 NOTE — Progress Notes (Signed)
Date Time: 10/16/18 10:10 AM  Patient Name: [name], [name]       CSN: [id number]  Attending Physician: Cecilie Lowers, MD  Primary Care Provider: Marisa Sprinkles, MD    Please call me at [phone] for any issues or concerns.       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    #Chronic alcohol abuse complicated with alcoholic ketoacidosis resolved with IV hydration  CIWA protocol with PRN Ativan  Continue folic acid thiamine  Continue phenobarb for alcohol withdrawal prevention  Seizure precaution fall precaution  Counseled for cessation  Social worker consult for outpatient resources.  Patient is not interested for inpatient rehab.    #Bicytopenia due to underlying alcohol abuse will monitor    # Intractable nausea vomiting: Improving    # Hypertension: Resume amlodipine    f#History of asthma continue albuterol nebulization as needed    DVT Prophylaxis: Lovenox    Code Status: No CPR with support      Disposition: Decrease fluid, advance her diet, possible discharge  tomorrow morning    Has roommate  Need help to get pancreatic enzymes    Emergency contact: Friend    I have discussed the above plan with the patient. They agreed and verbalized the understanding.   Also discussed with nurse and also in MDR.       Interim summary [facility] course)   58 y.o./female was admitted on 10/14/2018  4:16 PM with principal problem of Pancreatitis, alcoholic, acute.   Patient has underlying chronic alcohol abuse, pancreatitis, hypertension, asthma was admitted in ICU on 10/14/18 with acute on chronic alcoholic pancreatitis with alcoholic ketoacidosis.  Patient was managed conservatively got IV hydration.  Acidosis improved.  Hemoglobin/hematocrit 11.7/34.2 WBC 2.4 platelets 64.   Chest x-ray unremarkable.  Transferred to  regular floor on 10/15/2018.    SUBJECTIVE:     CC: Follow up of acute alcoholic pancreatitis  Pain is better  Lab reviewed, lipase is better  On clear liquid, agreeable to advance the diet      PHYSICAL EXAM:   Temp:  [98.1 F (36.7 C)-99 F (37.2 C)] 99 F (37.2 C)  Heart Rate:  [68-113] 102  Resp Rate:  [8-25] 18  BP: (103-133)/(58-99) 107/76  Body mass index is 26.62 kg/m.    Intake/Output Summary (Last 24 hours) at 10/16/2018 1010  Last data filed at 10/16/2018 0900  Gross per 24 hour   Intake 2080 ml   Output    Net 2080 ml     Weight Monitoring 08/20/2018 08/29/2018 10/14/2018 10/14/2018 10/14/2018 10/15/2018 10/15/2018   Height 167.6 cm 167.6 cm 165.1 cm 165.1 cm 167.6 cm - -   Height Method - Stated Stated Stated Stated - -   Weight 68.04 kg 69.9 kg - 68.04 kg 68.1 kg 71.3 kg 74.8 kg   Weight Method - Actual - Stated Bed Scale - Standing Scale   BMI (calculated) 24.3 kg/m2 24.9 kg/m2 - 25 kg/m2 24.3 kg/m2 - -         Exam:   General: moderately built, no acute distress   HEENT: no JVD and no lymphadenopathy  Respt: CTA bilaterally. No crackles or wheezing. No accessory muscle use   CVS: S1, S2 normal, RRR, no thrill or murmur or rubs  GI: soft and non tender with good bowel sounds. No hepatosplenomegaly  Neuro: alert and oriented X3. No facial asymmetry. Normal palatal movement. Moving all extremities without any asymmetry.   Psychiatry:  Mood appropriate. No suicidal or homicidal ideation.   Extremities; no edema. No calf tenderness bilaterally. Bilateral DPA palpable     MEDS: (SCHEDULED/INFUSIONS/PRN)     Current Facility-Administered Medications   Medication Dose Route Frequency    enoxaparin  40 mg Subcutaneous Q24H    famotidine  20 mg Oral Q12H SCH    HYDROmorphone  0.5 mg Intravenous Once    insulin lispro (1 Unit Dial)  1-9 Units Subcutaneous TID AC    And    insulin lispro (1 Unit Dial)  1-7 Units Subcutaneous QHS    nursing care medication protocol placeholder  1 each Does not apply See Admin  Instructions    sodium chloride (PF)  10 mL Intracatheter Q12H SCH    sodium chloride (PF)  3 mL Intravenous Q8H    sodium chloride (PF)  3 mL Intravenous Q8H    vitamin B-1  100 mg Oral Daily     Current Facility-Administered Medications   Medication Dose Route Frequency Last Rate    lactated ringers   Intravenous Continuous 75 mL/hr at 10/16/18 1610     Current Facility-Administered Medications   Medication Dose Route    albuterol  2.5 mg Nebulization    dextrose  125 mL Intravenous    glucagon (rDNA)  1 mg Intramuscular    hydrALAZINE  10 mg Intravenous    HYDROmorphone  1 mg Intravenous    HYDROmorphone  2 mg Intravenous    insulin lispro (1 Unit Dial)  1-7 Units Subcutaneous    LORazepam  2 mg Oral    LORazepam  4 mg Oral    ondansetron  4 mg Intravenous    sodium chloride (PF)  10 mL Intracatheter         LABS:     Recent Labs   Lab 10/16/18  0248 10/15/18  1100   WBC 3.3* 2.4*   RBC 2.98* 3.34*   Hemoglobin 10.3* 11.7*   Hematocrit 31.0* 34.2*   MCV 104* 102*   PLT CT 53* 68*       Recent Labs   Lab 10/16/18  0248 10/15/18  1930   Sodium 129* 129*   Potassium 3.5 3.6   Chloride 96* 94*   CO2 25 25   BUN 8 9   Creatinine 0.74 0.83   Glucose 119* 176*   EGFR 90 78   Calcium 8.2* 8.7       Recent Labs   Lab 10/16/18  0248 10/15/18  1100 10/15/18  0633   Magnesium 1.8 1.8 2.1   Phosphorus  --  1.7* 1.6*       Recent Labs   Lab 10/16/18  0248 10/15/18  0633   ALT 23 30   AST (SGOT) 59* 71*   Bilirubin, Total 1.3* 1.9*   Bilirubin Direct 0.9* 1.3*   Albumin 2.6* 3.3*   Alkaline Phosphatase 200* 247*             Recent Labs   Lab 10/14/18  1519   PT INR 1.1        Lab Results   Component Value Date    HGBA1CPERCNT 5.0 10/14/2018         IMAGING STUDIES:   Xr Chest Ap Portable    Result Date: 10/15/2018  No acute  cardiopulmonary findings given portable technique. ReadingStation:[id number]      MICROBIOLOGY AND/OR TELEMETRY:         There are no questions and answers to display.               Signed by:  Cecilie Lowers, MD 10/16/2018 10:10 AM  [address]  [place] [phone]  Office Ph: [phone]

## 2018-10-16 NOTE — Progress Notes (Signed)
NURSE NOTE SUMMARY  [facility] - MED TELEMETRY STEP-DOWN   Patient Name: [name]   Attending Physician: Cecilie Lowers, MD   Today's date:   10/16/2018 LOS: 2 days   Shift Summary:                                                              7:12 PM- Bedside report completed with off-going RN. Assumed care of patient. Reviewed orders and updated plan of care whiteboard. Appropriate safety interventions in place w/ bed alarm on and audible. Tele intact. IV site intact and LR infusing. Patient encouraged to call for assist. Will continue to monitor.     Provider Notifications:      Rapid Response Notifications:  Mobility:      PMP Activity: Step 5 - Chair;Step 6 - Walks in Room (10/16/2018  7:36 AM)     Weight tracking:  Family Dynamic:   Last 3 Weights for the past 72 hrs (Last 3 readings):   Weight   10/15/18 1914 74.8 kg (164 lb 14.5 oz)   10/15/18 0500 71.3 kg (157 lb 3 oz)   10/14/18 1959 68.1 kg (150 lb 2.1 oz)             Recent Vitals Last Bowel Movement   BP: 128/79 (10/16/2018  6:06 PM)  Heart Rate: (!) 104 (10/16/2018  3:44 PM)  Temp: 99 F (37.2 C) (10/16/2018  3:44 PM)  Resp Rate: 18 (10/16/2018  3:44 PM)  Weight: 74.8 kg (164 lb 14.5 oz) (10/15/2018  7:14 PM)  SpO2: 92 % (10/16/2018 11:55 AM)   No data recorded

## 2018-10-17 LAB — CBC AND DIFFERENTIAL
Basophils %: 0.8 % (ref 0.0–3.0)
Basophils Absolute: 0 10*3/uL (ref 0.0–0.3)
Eosinophils %: 2.3 % (ref 0.0–7.0)
Eosinophils Absolute: 0.1 10*3/uL (ref 0.0–0.8)
Hematocrit: 29.7 % — ABNORMAL LOW (ref 36.0–48.0)
Hemoglobin: 9.8 gm/dL — ABNORMAL LOW (ref 12.0–16.0)
Lymphocytes Absolute: 1.2 10*3/uL (ref 0.6–5.1)
Lymphocytes: 24.7 % (ref 15.0–46.0)
MCH: 35 pg (ref 28–35)
MCHC: 33 gm/dL (ref 32–36)
MCV: 104 fL — ABNORMAL HIGH (ref 80–100)
MPV: 8.6 fL (ref 6.0–10.0)
Monocytes Absolute: 0.5 10*3/uL (ref 0.1–1.7)
Monocytes: 10.4 % (ref 3.0–15.0)
Neutrophils %: 61.8 % (ref 42.0–78.0)
Neutrophils Absolute: 2.9 10*3/uL (ref 1.7–8.6)
PLT CT: 58 10*3/uL — ABNORMAL LOW (ref 130–440)
RBC: 2.85 10*6/uL — ABNORMAL LOW (ref 3.80–5.00)
RDW: 14.9 % — ABNORMAL HIGH (ref 11.0–14.0)
WBC: 4.7 10*3/uL (ref 4.0–11.0)

## 2018-10-17 LAB — HEPATIC FUNCTION PANEL
ALT: 23 U/L (ref 0–55)
AST (SGOT): 44 U/L — ABNORMAL HIGH (ref 10–42)
Albumin/Globulin Ratio: 0.84 Ratio (ref 0.80–2.00)
Albumin: 2.6 gm/dL — ABNORMAL LOW (ref 3.5–5.0)
Alkaline Phosphatase: 205 U/L — ABNORMAL HIGH (ref 40–145)
Bilirubin Direct: 0.7 mg/dL — ABNORMAL HIGH (ref 0.0–0.3)
Bilirubin, Total: 1.1 mg/dL (ref 0.1–1.2)
Globulin: 3.1 gm/dL (ref 2.0–4.0)
Protein, Total: 5.7 gm/dL — ABNORMAL LOW (ref 6.0–8.3)

## 2018-10-17 LAB — BASIC METABOLIC PANEL
Anion Gap: 10.2 mMol/L (ref 7.0–18.0)
BUN / Creatinine Ratio: 13.2 Ratio (ref 10.0–30.0)
BUN: 10 mg/dL (ref 7–22)
CO2: 25 mMol/L (ref 20–30)
Calcium: 7.9 mg/dL — ABNORMAL LOW (ref 8.5–10.5)
Chloride: 98 mMol/L (ref 98–110)
Creatinine: 0.76 mg/dL (ref 0.60–1.20)
EGFR: 87 mL/min/{1.73_m2} (ref 60–150)
Glucose: 108 mg/dL — ABNORMAL HIGH (ref 71–99)
Osmolality Calculated: 260 mOsm/kg — ABNORMAL LOW (ref 275–300)
Potassium: 3.2 mMol/L — ABNORMAL LOW (ref 3.5–5.3)
Sodium: 130 mMol/L — ABNORMAL LOW (ref 136–147)

## 2018-10-17 LAB — MAGNESIUM: Magnesium: 1.5 mg/dL — ABNORMAL LOW (ref 1.6–2.6)

## 2018-10-17 LAB — VH DEXTROSE STICK GLUCOSE
Glucose POCT: 118 mg/dL — ABNORMAL HIGH (ref 71–99)
Glucose POCT: 123 mg/dL — ABNORMAL HIGH (ref 71–99)

## 2018-10-17 LAB — CALCIUM, IONIZED: Calcium, Ionized: 4.36 mg/dL (ref 4.35–5.10)

## 2018-10-17 MED ORDER — VH POTASSIUM CHLORIDE CRYS ER 20 MEQ PO TBCR (WRAP)
40.00 meq | EXTENDED_RELEASE_TABLET | Freq: Once | ORAL | Status: AC
Start: 2018-10-17 — End: 2018-10-17
  Administered 2018-10-17: 05:00:00 40 meq via ORAL
  Filled 2018-10-17 (×2): qty 2

## 2018-10-17 MED ORDER — VH MAGNESIUM SULFATE 2 G IN 50 ML IV PREMIX
2.00 g | Freq: Once | INTRAVENOUS | Status: AC
Start: 2018-10-17 — End: 2018-10-17
  Administered 2018-10-17: 09:00:00 2 g via INTRAVENOUS
  Filled 2018-10-17 (×2): qty 50

## 2018-10-17 MED ORDER — PANCRELIPASE (LIP-PROT-AMYL) 24000-76000 UNITS PO CPEP
72000.00 [IU] | ORAL_CAPSULE | Freq: Three times a day (TID) | ORAL | 0 refills | Status: DC
Start: 2018-10-17 — End: 2018-11-16

## 2018-10-17 NOTE — Progress Notes (Signed)
[place] instructions reviewed with the pt. Pt understands. IV x 2 and tele [place]. Pt waiting for family to come

## 2018-10-17 NOTE — UM Notes (Addendum)
[name], RN, BSN  Phone [phone]  Fax [phone]  Utilization Review  [facility]      [place]  Mar 14, 1961  REF [id number]  Clinical for 7/8  Pt discharge on 10/17/18    Date Time: 10/16/18 10:10 AM  Patient Name: [name], [name]       CSN: [id number]  Attending Physician: Cecilie Lowers, MD  Primary Care Provider: Pcp, None, MD    Please call me at [phone] for any issues or concerns.       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    #Chronic alcohol abuse complicated with alcoholic ketoacidosis resolved with IV hydration  CIWA protocolwith PRN Ativan  Continue folic acid thiamine  Continue phenobarb for alcohol withdrawal prevention  Seizure precaution fall precaution  Counseled for cessation  Social worker consult for outpatient resources.  Patient is not interested for inpatient rehab.    #Bicytopenia due to underlying alcohol abuse will monitor    #Intractable nausea vomiting: Improving    #Hypertension: Resume amlodipine    f#History of asthma continue albuterol nebulization as needed    DVT Prophylaxis: Lovenox    Code Status: No CPR with support      Disposition: Decrease fluid, advance her diet, possible discharge  tomorrow morning    Has roommate  Need help to get pancreatic enzymes    Emergency contact: Friend    I have discussed the above plan with the patient. They agreed and verbalized the understanding.   Also discussed with nurse and also in MDR.       Interim summary [facility] course)   58 y.o./female was admitted on 10/14/2018  4:16 PM with principal problem of Pancreatitis, alcoholic, acute.   Patient has underlying chronic alcohol abuse, pancreatitis, hypertension, asthma was admitted in ICU on 10/14/18 with acute on chronic alcoholic pancreatitis with  alcoholic ketoacidosis. Patient was managed conservatively got IV hydration. Acidosis improved.Hemoglobin/hematocrit 11.7/34.2 WBC 2.4 platelets 64.   Chest x-ray unremarkable.  Transferred to regular floor on 10/15/2018.    SUBJECTIVE:     CC: Follow up of acute alcoholic pancreatitis  Pain is better  Lab reviewed, lipase is better  On clear liquid, agreeable to advance the diet      PHYSICAL EXAM:   Temp:  [98.1 F (36.7 C)-99 F (37.2 C)] 99 F (37.2 C)  Heart Rate:  [68-113] 102  Resp Rate:  [8-25] 18  BP: (103-133)/(58-99) 107/76  Body mass index is 26.62 kg/m.    Intake/Output Summary (Last 24 hours) at 10/16/2018 1010  Last data filed at 10/16/2018 0900      Gross per 24 hour   Intake 2080 ml   Output --   Net 2080 ml     Weight Monitoring 08/20/2018 08/29/2018 10/14/2018 10/14/2018 10/14/2018 10/15/2018 10/15/2018   Height 167.6 cm 167.6 cm 165.1 cm 165.1 cm 167.6 cm - -   Height Method - Stated Stated Stated Stated - -   Weight 68.04 kg 69.9 kg - 68.04 kg 68.1 kg 71.3 kg 74.8 kg   Weight Method - Actual - Stated Bed Scale - Standing Scale   BMI (calculated) 24.3 kg/m2 24.9 kg/m2 - 25 kg/m2 24.3 kg/m2 - -         Exam:   General: moderately  built, no acute distress   HEENT: no JVD and no lymphadenopathy  Respt: CTA bilaterally. No crackles or wheezing. No accessory muscle use   CVS: S1, S2 normal, RRR, no thrill or murmur or rubs  GI: soft and non tender with good bowel sounds. No hepatosplenomegaly  Neuro: alert and oriented X3. No facial asymmetry. Normal palatal movement. Moving all extremities without any asymmetry.   Psychiatry:  Mood appropriate. No suicidal or homicidal ideation.   Extremities; no edema. No calf tenderness bilaterally. Bilateral DPA palpable     MEDS: (SCHEDULED/INFUSIONS/PRN)            Current Facility-Administered Medications   Medication Dose Route Frequency    enoxaparin  40 mg Subcutaneous Q24H    famotidine  20 mg Oral Q12H SCH    HYDROmorphone  0.5 mg Intravenous Once     insulin lispro (1 Unit Dial)  1-9 Units Subcutaneous TID AC    And    insulin lispro (1 Unit Dial)  1-7 Units Subcutaneous QHS    nursing care medication protocol placeholder  1 each Does not apply See Admin Instructions    sodium chloride (PF)  10 mL Intracatheter Q12H SCH    sodium chloride (PF)  3 mL Intravenous Q8H    sodium chloride (PF)  3 mL Intravenous Q8H    vitamin B-1  100 mg Oral Daily     Infusion Meds           Current Facility-Administered Medications   Medication Dose Route Frequency Last Rate    lactated ringers   Intravenous Continuous 75 mL/hr at 10/16/18 0958        PRN Medications         Current Facility-Administered Medications   Medication Dose Route    albuterol  2.5 mg Nebulization    dextrose  125 mL Intravenous    glucagon (rDNA)  1 mg Intramuscular    hydrALAZINE  10 mg Intravenous    HYDROmorphone  1 mg Intravenous    HYDROmorphone  2 mg Intravenous    insulin lispro (1 Unit Dial)  1-7 Units Subcutaneous    LORazepam  2 mg Oral    LORazepam  4 mg Oral    ondansetron  4 mg Intravenous    sodium chloride (PF)  10 mL Intracatheter            LABS:          Recent Labs   Lab 10/16/18  0248 10/15/18  1100   WBC 3.3* 2.4*   RBC 2.98* 3.34*   Hemoglobin 10.3* 11.7*   Hematocrit 31.0* 34.2*   MCV 104* 102*   PLT CT 53* 68*            Recent Labs   Lab 10/16/18  0248 10/15/18  1930   Sodium 129* 129*   Potassium 3.5 3.6   Chloride 96* 94*   CO2 25 25   BUN 8 9   Creatinine 0.74 0.83   Glucose 119* 176*   EGFR 90 78   Calcium 8.2* 8.7       Recent Labs   Lab 10/16/18  0248 10/15/18  1100 10/15/18  0633   Magnesium 1.8 1.8 2.1   Phosphorus  --  1.7* 1.6*            Recent Labs   Lab 10/16/18  0248 10/15/18  0633   ALT 23 30   AST (SGOT) 59* 71*   Bilirubin, Total 1.3*  1.9*   Bilirubin Direct 0.9* 1.3*   Albumin 2.6* 3.3*   Alkaline Phosphatase 200* 247*                 Recent Labs   Lab 10/14/18  1519   PT INR 1.1

## 2018-10-17 NOTE — Discharge Summary (Signed)
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   Tolerated regular diet  Resume pancreatic enzyme.   Sent prescription to [employer] for discont.   Outpatient GI follow up  Need assistance for creon prescription.   Counseled for abstinence from alcohol.         #Chronic alcohol abuse complicated with alcoholic ketoacidosis resolved with IV hydration. No DT or seizure.     Social worker consult for outpatient resources, provided outpatient resource. .  Patient is not interested for inpatient rehab.    #Bicytopenia anemia/thrombocytopenia:  due to underlying alcohol abuse will monitor  Lab Results   Component Value Date    WBC 4.7 10/17/2018    HGB 9.8 (L) 10/17/2018    HCT 29.7 (L) 10/17/2018    MCV 104 (H) 10/17/2018    PLT 58 (L) 10/17/2018     Follow up with PCP. No active bleeding    #Intractable nausea vomiting: Improved.     #Hypertension: Resumed amlodipine    f#History of asthma continue albuterol nebulization as needed    Hypokalemia and hypomagnesemia: replete, repeat in a week with PCP       Hospital Course       [name] is a 58 y.o. female patient that was admitted on 10/14/2018.   Patient has underlying chronic alcohol abuse, pancreatitis, hypertension, asthma was admitted in ICU on 10/14/18 with acute on chronic alcoholic pancreatitis with alcoholic ketoacidosis. Patient was managed conservatively got IV hydration. Acidosis improved.Hemoglobin/hematocrit 11.7/34.2 WBC 2.4 platelets 64.   Chest x-ray unremarkable.  Transferred to regular floor on 10/15/2018.  Advanced diet, tolerated well.   Recommended alcohol rehab refused.   Counseled for abstinence from alcohol.   Outpatient resource provided.   Evaluated by  GI  Outpatient follow up with GI.     Lab Results   Component Value Date    LIP 243 (H) 10/16/2018         Subjective:   Patient is seen and examined on the day of discharge.   Denies any issues.   Comfortable going home.     The above plan including the benefits/side effects of medications was discussed with the patient. Patient  agreed and verbalized the understanding.     Also discussed with the nurse, no issues identified.               Discharge Instructions:          Disposition: Home  Diet: Cardiac Diet  Activity: As tolerated  Discharge Code Status: NO CPR - SUPPORT [facility]    [name]  [address]  [place] [address]  [phone]  Follow up  Follow- up on July 14 at 1030 AM.     Jearld Pies, DO  [address]  [place] [address]  [phone]    Call  for follow up for chronic pancreatitis         Discharge Medications:  Discharge Medication List      Taking    albuterol 108 (90 Base) MCG/ACT inhaler  Dose:  2 puff  Commonly known as:  PROVENTIL HFA  Inhale 2 puffs into the lungs every 6 (six) hours as needed for Wheezing     amLODIPine 5 MG tablet  Dose:  5 mg  Commonly known as:  NORVASC  Take 1 tablet (5 mg total) by mouth daily     furosemide 20 MG tablet  Dose:  20 mg  Commonly known as:  LASIX  For:  Edema  Take 1 tablet (20 mg total) by mouth daily as needed (for legs edema or abdominal swelling)     ibuprofen 200 MG tablet  Dose:  400 mg  Commonly known as:  ADVIL  Take 400 mg by mouth every 6 (six) hours as needed for Pain.     multivitamin with minerals tablet  Dose:  1 tablet  Take 1 tablet by mouth every morning.      naloxone 4 MG/0.1ML nasal spray  Commonly known as:  NARCAN  For:  Opioid Overdose  1 spray intranasally. If pt does not respond or relapses into respiratory depression call 911. Give additional doses every 2-3 min.     ondansetron 4 MG disintegrating  tablet  Dose:  4 mg  Commonly known as:  ZOFRAN-ODT  Take 1 tablet (4 mg total) by mouth every 8 (eight) hours as needed for Nausea     pancrelipase (Lip-Prot-Amyl) 24000-76000 units Cpep  Dose:  72,000 units of lipase  Commonly known as:  CREON 24000  Take 3 capsules (72,000 units of lipase total) by mouth 3 (three) times daily with meals     potassium chloride 20 MEQ tablet  Dose:  20 mEq  What changed:  when to take this  Commonly known as:  KLOR-CON  Take 1 tablet (20 mEq total) by mouth 2 (two) times daily.     spironolactone 50 MG tablet  Dose:  50 mg  Commonly known as:  ALDACTONE  Take 1 tablet (50 mg total) by mouth daily     traMADol 50 MG tablet  Dose:  50 mg  Commonly known as:  ULTRAM  Take 1 tablet (50 mg total) by mouth every 6 (six) hours as needed for Pain        STOP taking these medications    sodium bicarbonate 650 MG tablet     vitamin B-1 100 MG tablet  Commonly known as:  THIAMINE             Discharge Day Exam (10/17/2018):     Blood pressure 125/87, pulse (!) 108, temperature 98.1 F (36.7 C), temperature source Temporal, resp. rate 18, height 1.676 m (5\' 6" ), weight 78.5 kg (173 lb 1 oz), SpO2 92 %.    General: Patient is awake. In no acute distress.  Chest: CTA bilaterally. No rhonchi, no wheezing. No use of accessory muscles.  CVS: Normal rate and regular rhythm no murmurs, without JVD.  Abdomen: Soft, non-tender, no guarding or rigidity, with normal bowel sounds.  Extremities: No pitting edema, pulses palpable, no calf swelling and no gross deformity.  Skin: Warm, dry  NEURO: alert and orientedX3. No motor or sensory deficits.     Recent Labs      Recent Labs   Lab 10/17/18  [phone] 10/16/18  0248 10/15/18  1100 10/15/18  0633 10/14/18  2027   WBC 4.7 3.3* 2.4* 2.5* 10.8   RBC 2.85*  2.98* 3.34* 3.76* 3.62*   Hemoglobin 9.8* 10.3* 11.7* 12.6 12.6   Hematocrit 29.7* 31.0* 34.2* 38.4 36.9   MCV 104* 104* 102* 102* 102*   PLT CT 58* 53* 68* 70* 104*     Recent Labs   Lab 10/14/18  1519   PT 11.5    PT INR 1.1         Lab Results   Component Value Date    HGBA1CPERCNT 5.0 10/14/2018     Recent Labs   Lab 10/17/18  0337 10/16/18  1931 10/16/18  1029 10/16/18  0248 10/15/18  1930   Glucose 108* 112* 122* 119* 176*   Sodium 130* 130* 128* 129* 129*   Potassium 3.2* 3.3* 3.7 3.5 3.6   Chloride 98 96* 98 96* 94*   CO2 25 27 24 25 25    BUN 10 11 9 8 9    Creatinine 0.76 0.82 0.77 0.74 0.83   EGFR 87 79 85 90 78   Calcium 7.9* 8.1* 7.9* 8.2* 8.7     Recent Labs   Lab 10/17/18  0536 10/17/18  0337 10/16/18  0248 10/15/18  1100 10/15/18  0633 10/15/18  0209 10/14/18  2027  10/14/18  1519   Magnesium 1.5*  --  1.8 1.8 2.1 2.5 1.4*  More results in Results Review  --    Phosphorus  --   --   --  1.7* 1.6*  --  3.7  --   --    Albumin  --  2.6* 2.6*  --  3.3*  --  3.6  --  4.2   Protein, Total  --  5.7* 5.7*  --  7.1  --  8.0  --  9.1*   Bilirubin, Total  --  1.1 1.3*  --  1.9*  --  1.5*  --  1.8*   Alkaline Phosphatase  --  205* 200*  --  247*  --  314*  --  342*   ALT  --  23 23  --  30  --  36  --  42   AST (SGOT)  --  44* 59*  --  71*  --  85*  --  102*   More results in Results Review = values in this interval not displayed.        Allergies:      Morphine   Time spent on discharging the patient:  32 minutes   Xr Chest Ap Portable    Result Date: 10/15/2018  No acute cardiopulmonary findings given portable technique. ReadingStation:[email or web address]     Home Health Needs:  There are no questions and answers to display.      Cecilie Lowers, MD         10/17/18 9:31 AM   MRN: [id number]                                      CSN: [id number] DOB: 1960/07/28

## 2018-10-17 NOTE — Plan of Care (Signed)
Problem: Moderate/High Fall Risk Score >5  Goal: Patient will remain free of falls  Outcome: Progressing  Flowsheets (Taken 10/17/2018 0717)  VH High Risk (Greater than 13):   ALL REQUIRED LOW INTERVENTIONS   RED "HIGH FALL RISK" SIGNAGE   ALL REQUIRED MODERATE INTERVENTIONS   BED ALARM WILL BE ACTIVATED WHEN THE PATEINT IS IN BED WITH SIGNAGE "RESET BED ALARM"   A CHAIR PAD ALARM WILL BE USED WHEN PATIENT IS UP SITTING IN A CHAIR   PATIENT IS TO BE SUPERVISED FOR ALL TOILETING ACTIVITIES   A safety companion may be used when deemed appropriate by the Primary RN and Clinical Administrator   Keep door open for better visibility   Request PT/OT therapy consult order from physician for patients with gait/mobility impairment     Problem: Compromised Tissue integrity  Goal: Damaged tissue is healing and protected  Outcome: Progressing  Flowsheets (Taken 10/17/2018 0719)  Damaged tissue is healing and protected:   Keep intact skin clean and dry   Use bath wipes, not soap and water, for daily bathing   Use incontinence wipes for cleaning urine, stool and caustic drainage. Foley care as needed   Encourage use of lotion/moisturizer on skin   Monitor patient's hygiene practices  Goal: Nutritional status is improving  Outcome: Progressing  Flowsheets (Taken 10/17/2018 0719)  Nutritional status is improving:   Assist patient with eating   Allow adequate time for meals   Encourage patient to take dietary supplement(s) as ordered   Collaborate with Clinical Nutritionist

## 2018-10-18 NOTE — UM Notes (Signed)
[name], RN, BSN  Phone [phone]  Fax [phone]  Utilization Review  [facility]      [place]  09/28/1960  REF [id number]  Discharged on 10/17/2018

## 2018-10-22 ENCOUNTER — Ambulatory Visit (INDEPENDENT_AMBULATORY_CARE_PROVIDER_SITE_OTHER): Payer: Medicaid Other | Admitting: Family

## 2018-10-22 ENCOUNTER — Encounter (INDEPENDENT_AMBULATORY_CARE_PROVIDER_SITE_OTHER): Payer: Self-pay | Admitting: Family

## 2018-10-22 VITALS — BP 138/88 | HR 72 | Temp 98.3°F | Resp 18 | Ht 66.0 in | Wt 153.0 lb

## 2018-10-22 DIAGNOSIS — Z09 Encounter for follow-up examination after completed treatment for conditions other than malignant neoplasm: Secondary | ICD-10-CM

## 2018-10-22 DIAGNOSIS — E876 Hypokalemia: Secondary | ICD-10-CM

## 2018-10-22 DIAGNOSIS — I10 Essential (primary) hypertension: Secondary | ICD-10-CM

## 2018-10-22 DIAGNOSIS — Z79899 Other long term (current) drug therapy: Secondary | ICD-10-CM

## 2018-10-22 DIAGNOSIS — D696 Thrombocytopenia, unspecified: Secondary | ICD-10-CM

## 2018-10-22 DIAGNOSIS — D649 Anemia, unspecified: Secondary | ICD-10-CM

## 2018-10-22 DIAGNOSIS — R790 Abnormal level of blood mineral: Secondary | ICD-10-CM

## 2018-10-22 DIAGNOSIS — Z760 Encounter for issue of repeat prescription: Secondary | ICD-10-CM

## 2018-10-22 LAB — VH AMB POCT CHEM8 ISTAT
Anion Gap POCT: 19 mmol/L — AB (ref 7.0–16.0)
BUN POCT: 3 mg/dL — AB (ref 7.0–22.0)
CO2 POCT: 23 mmol/L — AB (ref 24.0–29.0)
Calcium Ionized POCT: 5 mg/dL (ref 4.35–5.10)
Chloride POCT: 99 mmol/L (ref 98–110)
Creatinine POCT: 0.7 mg/dL (ref 0.60–1.20)
EGFR POCT: 96 mL/min/{1.73_m2} (ref 60–150)
Glucose POCT: 100 mg/dL — AB (ref 71–99)
Hematocrit POCT: 36 % (ref 36.0–48.0)
Hemoglobin POCT: 12.2 gm/dL (ref 12.0–16.0)
Potassium POCT: 2.8 mmol/L — AB (ref 3.5–5.3)
Sodium POCT: 137 mmol/L (ref 136–147)

## 2018-10-22 MED ORDER — AMLODIPINE BESYLATE 5 MG PO TABS
5.0000 mg | ORAL_TABLET | Freq: Every day | ORAL | 1 refills | Status: DC
Start: 2018-10-22 — End: 2018-12-01

## 2018-10-22 NOTE — Patient Instructions (Addendum)
For primary care, you now have an appointment December 04, 2018 at [facility] family practice.  Please review the detailed letter you were provided regarding that appointment.    Your potassium level in the office today was low; the result was 2.8.  At approximately 11:30 AM today, while at the office, you took potassium supplement 60 mEq by mouth.    Please return to the transition clinic tomorrow, October 23, 2018 at 9:30 AM for follow-up lab work to recheck your potassium level.  In the meantime, if you develop any chest pain, palpitations, muscle spasm, muscle pain, dizziness, shortness of breath, seek immediate medical attention by calling 911 and get to the nearest emergency department for further evaluation.    Thank you for bringing your medication bottles with you to today's appointment.  Please continue to take all of your medication bottles with you to all appointments with health care providers.    You have a follow-up appointment at the transition clinic on November 12, 2018 at 10:30 AM.  The phone number to the transition clinic is [phone].  You have been provided with lab referral for complete blood count, magnesium level and chem 8 to have completed prior to that appointment.    The transition clinic has called [facility] gastroenterology and were instructed to have you call their business office. Please review the detailed letter you were provided with phone number and instructions.  Please contact the transition clinic once you have made an appointment with [facility] gastroenterology.     Please follow your hospital discharge instructions.    If you have questions, or need an appointment, then please call the transition clinic [phone].    Please review the following:      Pancreatitis  The pancreas is an organ in theabdomen that secretes digestive juices into the stomach. Pancreatitis is an inflammation of the pancreas. In many cases, it's caused when the duct that connects the  pancreas and gallbladder is blocked by a gallstone.Heavy alcohol use is another major cause.Less common causes can include medicines, trauma, certain medical procedures, viruses, and toxins. Sometimes the cause of pancreatitis can't be found.Genetic testing is sometimes done in those cases, especially if there is a family history of pancreas disease.   Symptoms of pancreatitis include:   Severe abdominal pain   Nausea and vomiting   Severe indigestion   Racing heart   Fever  If the pancreatitis becomes chronic, diarrhea, chronic pain, weight loss, and poor nutrition can result.   At first, pancreatitis may be treated in the hospital.It may be diagnosed by history, exam, blood tests, and sometimes imaging studies.There, fluids and medicines can be provided. The underlying cause of the problem must also be treated to prevent further problems. If gallstones are the cause, you and your healthcare provider can discuss options for treating them.This usually results in gallbladder surgery. Sometimes another test must be done to clear the drainage ducts of a blocked gallstones.If alcohol is the cause, talk with your healthcare provider about a program to help you stop drinking.   Home care   Don't drink alcohol.   Rest in bed or sit up in a chair until you feel better.   Take medicines as prescribed. If you were givenan antibiotic for infection,take it until it's gone, even if you feel better. Let your healthcare provider know if you vomit up your medicine.  Tips for eating and drinking:   If instructed, don't eat or drink until nausea and vomiting go  away.   Try sipping clear liquids to prevent dehydration.   When you begin eating again, start with small amounts. Have small, more frequent meals rather than larger meals.Low fat meals are best.  Follow-up care  Follow up with your healthcare provider as advised.   When to seek medical advice  Call your healthcare provider right away for any of the  following:    Continued or worsening pain   Repeated vomiting   Dizziness, weakness   Fever of 100.4 F (38 C) or higher, or as directed by your healthcare provider   Severe muscle cramps  Call 911  Call 911 if you have any of the following:    Vomiting blood or large amounts of blood in stool   Seizure   Loss of consciousness  [employer] last reviewed this educational content on 11/08/2017   2000-2020 [employer], [place]. [address], [place], [place] [address]. All rights reserved. This information is not intended as a substitute for professional medical care. Always follow your healthcare professional's instructions.        Hypokalemia  Hypokalemia means a low level of potassium in the blood. This most often occurs in people who take water pills (diuretics). It can also occur because of severe vomiting or diarrhea.You may also have it if youtake laxatives for long periods of time. It sometimes happens if you have low magnesium (hypomagnesemia). If you have this,yourhealthcare provider will treat the lowmagnesium first.  A mild case of hypokalemia usually causes no symptoms. It is only found with blood testing. More severe potassium loss causes overall weakness, muscle or abdominal cramps,rapid or irregular heartbeats (heart palpitations), low blood pressure,muscle weakness, and in some indviduals can cause temporary paralysis. .  Home care   Take any potassium supplements as prescribed.   Eat foods rich in potassium. The highest amount is found in avocado, baked potatoes, spinach, cantaloupe, cod, halibut, salmon, and scallops. White, red, or pinto beans are also very good sources. A modest amount of potassium is found in orange juice, bananas, carrots, and tomato juice.   If you take certain types of diuretics, you will also need to take potassium supplements. If you take a diuretic, discuss potassium supplements with your doctor.    Follow-up care  Follow up with yourhealthcare  providerfor a repeat blood test within the next week, or as advised by our staff.  When to seek medical advice  Call your healthcare provider right away if any of the following occur:   Increased weakness,fatigue, or muscle cramps   Dizziness  Call 911  Call 911 if any of the following occur:   Irregular heartbeat, extra beats, or very fast heart rate   Loss of consciousness  [employer] last reviewed this educational content on 10/09/2015   2000-2020 [employer], [place]. [address] [place], [place] [address]. All rights reserved. This information is not intended as a substitute for professional medical care. Always follow your healthcare professional's instructions.

## 2018-10-22 NOTE — Progress Notes (Signed)
Subjective:    Patient ID: [name] is a 58 y.o. female.   She presents to the transition clinic for assistance with finding a primary care provider.   She was seen at the Transition [facility] in January 2016 and had primary care scheduled.  She reports she moved to [place] in 2017 and established with Dr. Carlynn Purl for primary care, and returned to [place] in February 2020, and has not established with PCP locally.    She reports she went to ED on 10-14-2018 with abdominal pain and vomiting.  Her serum lipase was 1,771; beta hydroxybutyrate 10.02; alcohol level 168.  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    Chest x-ray showed no acute cardiopulmonary findings with portable technique.   She was treated with IV fluids and electrolyte replacement.   Serum lipase on 10-16-2018 was 243.   Gastroenterology was consulted. Patient denies any endoscopic/colonoscopy procedures.     Since her hospital discharge on October 17, 2018, she reports she is feeling much better. She reports her abdominal pain has subsided and denies current complaints of pain or discomfort.  She denies nausea, vomiting, diarrhea, blood in her stool, abdominal distention, fever or chills.  She reports peripheral edema has resolved and believes her weight is down, but she does not weigh herself at home.  Reports she stopped taking potassium supplement three days ago because peripheral edema resolved.   Reports she is following fluid restriction and taking in nutritious meals.   She denies alcohol intake since 10-12-2018; she denies tremors, shakes or seizure activity.  She denies new bruising, bleeding, dizziness or falls.  Reports generalized weakness is improving; denies muscle cramping, palpitations, chest pain, SOB, congestion, cough or sore throat.   She has her medication bottles with her today and reports she is taking the following medications:  Proventil inhaler 2 puffs every 6 hours PRN  wheezing  Norvasc 5 mg by mouth once a day (requesting a refill)  Lasix 40 mg by mouth once a day PRN peripheral edema or weight gain (reports she takes this twice a week; most recent dose on 10-20-2018)  MVI one tablet by mouth once a day  Creon 24000, three tablets by mouth three times a day  Protonix 40 mg by mouth once a day  Aldactone 50 mg by mouth once a day  Klor-con 20 meq by mouth twice a day (reports she stopped taking this three days ago)    She reports she has not called [facility] G[facility]ology to make follow up appointment.    Reports her PMH is significant for GERD, hemorrhoids, asthma, pneumonia; hypertension, cirrhosis of the liver; and pancreatitis.  She had ERCP and reports she had pancreatic stent placed in 2018 at [facility]; partial pancreatectomy in 2013 at [facility] and post op had IVC filter placed; cholecystectomy.   She denies known history of diabetes, high cholesterol, MI, stroke, renal disease, thyroid disease, DVT or PE.  She reports she smokes cigarettes; about 1/2 PPD.   Reports she consumes alcoholic beverages intermittently; most recently on October 12, 2018; reports she had four alcoholic drinks. She denies illicit drug use.   She reports her most recent endoscopy and colonoscopy were in 2018 and showed hemorrhoids; denies any other findings.   HPI  Review of Systems   Constitutional: Negative for chills, fatigue and fever.        Reports 20 pound weight loss since hospital discharge with diuretics; reports her usual weight  is 150 - 155 pounds.   HENT: Negative for congestion, sore throat and trouble swallowing.    Respiratory: Negative for cough and shortness of breath.    Cardiovascular: Negative for chest pain, palpitations and leg swelling.   Gastrointestinal: Negative for abdominal distention, abdominal pain, blood in stool, constipation, diarrhea, nausea and vomiting.   Genitourinary: Negative for difficulty urinating, dysuria, flank pain and hematuria.    Musculoskeletal: Negative for gait problem and myalgias.   Skin: Negative for rash and wound.   Neurological: Positive for weakness (generalized weakness is improving). Negative for dizziness, tremors, seizures and numbness.   Hematological: Negative for adenopathy. Bruises/bleeds easily (reports history of bruising easily; both arms with healing bruises).   Psychiatric/Behavioral: Negative for confusion, self-injury and suicidal ideas.        Reports she has supportive friends that live close by; she reports she feels safe to leave this appointment and return to her residence.         Objective:    Physical Exam  Constitutional:       General: She is not in acute distress.     Appearance: She is well-developed. She is not ill-appearing, toxic-appearing or diaphoretic.   HENT:      Head: Normocephalic and atraumatic.   Eyes:      General: No scleral icterus.        Right eye: No discharge.         Left eye: No discharge.      Conjunctiva/sclera: Conjunctivae normal.   Neck:      Vascular: No carotid bruit.      Trachea: No tracheal deviation.      Comments: No carotid bruits heard with auscultation.  Cardiovascular:      Rate and Rhythm: Normal rate and regular rhythm.      Pulses: Normal pulses.      Heart sounds: Normal heart sounds.   Pulmonary:      Effort: Pulmonary effort is normal. No respiratory distress.      Breath sounds: Normal breath sounds. No stridor. No wheezing or rales.   Abdominal:      General: Bowel sounds are normal.      Palpations: Abdomen is soft.      Tenderness: There is no abdominal tenderness. There is no guarding.   Musculoskeletal: Normal range of motion.         General: No swelling or tenderness.      Right lower leg: No edema.      Left lower leg: No edema.      Comments: MAEW. Rises from chair independently. Ambulates without difficulty.   Skin:     General: Skin is warm and dry.      Coloration: Skin is not jaundiced or pale.      Findings: Bruising (small areas of healing  bruising on arms) present. No erythema, lesion or rash.   Neurological:      Mental Status: She is alert and oriented to person, place, and time.      Coordination: Coordination normal.      Gait: Gait normal.   Psychiatric:         Mood and Affect: Mood normal.         Behavior: Behavior normal.         Thought Content: Thought content normal.         Judgment: Judgment normal.      Comments: Pleasant; calm.       BP 138/88 (BP Site:  Right arm, Patient Position: Sitting, Cuff Size: Large) Comment: taken with manual cuff   Pulse 72    Temp 98.3 F (36.8 C) (Oral)    Resp 18    Ht 1.676 m (5\' 6" )    Wt 69.4 kg (153 lb)    SpO2 97% Comment: Roomair   BMI 24.69 kg/m      Her weight on October 16, 2018; 173.06 pounds    Chem-8 in the office today resulted; sodium 137, potassium 2.8, chloride 99, ionized calcium 5, CO2; 23, glucose 100, BUN 3, creatinine 0.7, hemoglobin 12.2, hematocrit 36, estimated GFR 96.    On October 17, 2018; serum magnesium 1.5; WBCs 4.7; hemoglobin 9.8; hematocrit 29.7; platelet count 58; sodium 130, potassium 3.2, chloride 98, CO2; 25, calcium 7.9, glucose 108, creatinine 0.76, BUN 10, estimated GFR 87.  On October 15, 2018; cholesterol 172, triglycerides 97, HDL 70, LDL 83.  On October 14, 2018; A1c 5.      Assessment:       Hospital discharge follow-up  History of pancreatitis  Potassium sparing diuretic therapy  Potassium wasting diuretic therapy  20 pound weight loss with diuresis  Hypokalemia; at 11:30am, patient took 60 meq Klor-con by mouth   Low magnesium level; treated inpatient; will recheck level  Thrombocytopenia  Anemia  Hypertension   Alcohol use  Tobacco use  Health education  Prescription refill; Norvasc      Plan:       I met with patient.  Reviewed the role of the transition clinic. She verbalizes understanding.  Reviewed what a primary care provider is and the rationale for consistent health care follow up with PCP.  She verbalizes understanding.  The nursing staff at the transition clinic  reviewed primary care offices.  At the patient's request, the transition clinic called [facility], and she now has an appointment on 12-04-2018 to establish primary care. She was provided detailed letter about that appointment.  At the patient's request, the transition clinic called [facility] Gastroenterology and were told the patient needs to contact that office to review her account, before an appointment can be made. The patient was given this information and contact information to [facility] Gastroenterology.  I reviewed her inpatient lab and imaging results, and the rationale for follow up with health care providers.  I reviewed ill effects of alcohol consumption; advise her to stop alcohol consumption completely; review her risks of bleeding with low platelet count; importance of fall prevention; signs of bleeding to be alert for and seek medical attention. She verbalizes understanding.  I offer for her to meet with licensed social worker at the transition clinic, and she declines.   I reviewed her chem 8 results in the office today; her hypokalemia is likely from 20 pound diuresis and she admits she has not been taking her potassium supplement as prescribed for the past three days.   The patient was instructed and I witnessed her taking 60 meq by mouth of her Klor-con, without difficulty.  Instructed her to return to transition clinic tomorrow at 9:30am for follow up chem 8 and rationale for this. She verbalizes understanding.  In the meantime, I reviewed signs to seek medical attention.  I reviewed each of her medications; the rationale for diuretic therapy and taking aldactone each day consistently.  She reports her Lasix if PRN, and I reviewed how to take this for peripheral edema or weight gain 2 pounds in 24 hours or 5 pounds in one week. She  verbalizes understanding.  I scheduled follow up appointment at the transition clinic for 11-12-2018 at 10:30am.  I provided her with lab referral  for serum magnesium level, CBC and chem 8 to be done prior to 11-12-2018 appointment, but will review more specifically the date of lab work, after tomorrow's chem 8 results; and plan of care. She verbalizes understanding.   Reviewed signs to seek medical attention.  Printed out AVS; reviewed this with the patient and provided patient with a copy.  Ensured that the patient has phone number to Transition clinic to call if questions arise, if prescription refills are needed, or if appointment at [facility] is needed.  Patient verbalizes understanding of plan of care, and agrees with plan of care.

## 2018-10-23 ENCOUNTER — Encounter (INDEPENDENT_AMBULATORY_CARE_PROVIDER_SITE_OTHER): Payer: Self-pay | Admitting: Family

## 2018-10-23 ENCOUNTER — Ambulatory Visit (INDEPENDENT_AMBULATORY_CARE_PROVIDER_SITE_OTHER): Payer: Medicaid Other | Admitting: Family

## 2018-10-23 VITALS — BP 138/82 | HR 60 | Temp 98.2°F | Resp 18 | Ht 66.0 in | Wt 153.0 lb

## 2018-10-23 DIAGNOSIS — E876 Hypokalemia: Secondary | ICD-10-CM | POA: Insufficient documentation

## 2018-10-23 DIAGNOSIS — Z9889 Other specified postprocedural states: Secondary | ICD-10-CM

## 2018-10-23 DIAGNOSIS — Z79899 Other long term (current) drug therapy: Secondary | ICD-10-CM

## 2018-10-23 DIAGNOSIS — Z8719 Personal history of other diseases of the digestive system: Secondary | ICD-10-CM

## 2018-10-23 DIAGNOSIS — Z09 Encounter for follow-up examination after completed treatment for conditions other than malignant neoplasm: Secondary | ICD-10-CM

## 2018-10-23 LAB — VH AMB POCT CHEM8 ISTAT
Anion Gap POCT: 20 mmol/L — AB (ref 7.0–16.0)
BUN POCT: 4 mg/dL — AB (ref 7.0–22.0)
CO2 POCT: 22 mmol/L — AB (ref 24.0–29.0)
Calcium Ionized POCT: 5 mg/dL (ref 4.35–5.10)
Chloride POCT: 99 mmol/L (ref 98–110)
Creatinine POCT: 0.8 mg/dL (ref 0.60–1.20)
EGFR POCT: 82 mL/min/{1.73_m2} (ref 60–150)
Glucose POCT: 155 mg/dL — AB (ref 71–99)
Hematocrit POCT: 37 % (ref 36.0–48.0)
Hemoglobin POCT: 12.6 gm/dL (ref 12.0–16.0)
Potassium POCT: 3.3 mmol/L — AB (ref 3.5–5.3)
Sodium POCT: 136 mmol/L (ref 136–147)

## 2018-10-23 NOTE — Patient Instructions (Addendum)
For primary care, you now have an appointment December 04, 2018 at [facility] family [facility].  Please review the detailed letter you were provided regarding that appointment.    Your potassium level in the office today was better than yesterday, but slightly low:  3.3.    Please take your potassium supplement Klor-con 20 meq, one tablet by mouth twice a day consistently.  Continue taking your aldactone (spironolactone) 50 mg by mouth once a day.  Continue taking your lasix (Furosemide) 40 mg, one tablet by mouth once a day, as needed for weight gain or swelling in your feet.  ON THE DAYS THAT YOU TAKE ONE OF YOUR LASIX 40 MG TABLETS, PLEASE TAKE YOUR POTASSIUM SUPPLEMENT, KLOR-CON 20 meq, one tablet by mouth THREE TIMES a day, on that day.  Then resume the Klor-con 20 meq one tablet by mouth twice a day on the days you do not take a Lasix.    In one week, October 30, 2018, please go to the lab and  Have non-fasting lab work to check your chem 8 (with potassium level), magnesium level and complete blood count.  You were provided with lab referral for this lab work, and map of the [facility] campus with location of the lab. If you have not heard from the transition clinic by 10-31-2018 with the lab results, then please call the transition clinic at [phone] or Lorelee Market, RN direct line at [phone].      If you develop any chest pain, palpitations, muscle spasm, muscle pain, abdominal pain, vomiting, dizziness, shortness of breath, seek immediate medical attention by calling 911 and get to the nearest emergency department for further evaluation.    Thank you for bringing your medication bottles with you to today's appointment.  Please continue to take all of your medication bottles with you to all appointments with health care providers.    You have a follow-up appointment at the transition clinic on November 12, 2018 at 10:30 AM.  The phone number to the transition clinic is [phone].  You have been  provided with lab referral for complete blood count, magnesium level and chem 8 to have completed prior to that appointment.    The transition clinic will refer you to gastroenterology in [place], [place], at your request.     If you have questions about  Outpatient resources for alcohol use, you may call the licensed social worker at the transition clinic, [place], at [phone].    Please follow your hospital discharge instructions.    If you have questions, or need an appointment, then please call the transition clinic [phone].

## 2018-10-23 NOTE — Progress Notes (Signed)
Subjective:    Patient ID: [name] is a 58 y.o. female.  She presents to the transition clinic today for follow up chem 8, B/P and weight.  Yesterday at the Transition clinic, her potassium level was 2.8; she took additional Klor-con 60 meq supplement by mouth.    Since her appointment yesterday, she denies muscle cramping, palpitations, chest pain or discomfort.  She reports she feels well. She denies dizziness, headache, sore throat, congestion, cough or SOB. She denies abdominal distention, pain, vomiting or diarrhea.  She reports her weight today is same as yesterday.  She did not take Lasix today as she denies peripheral edema or weight gain. She reports she did not take her Klor-con supplement yet today.   Reports she has not consumed alcohol since 10-12-2018.    She is requesting to be referred to Gastroenterology office in [place], [place], for GI follow up.    She reports she is taking the following medications:  Proventil inhaler 2 puffs every 6 hours PRN wheezing  Norvasc 5 mg by mouth once a day   Lasix 40 mg by mouth once a day PRN peripheral edema or weight gain (reports she takes this twice a week; most recent dose on 10-20-2018)  MVI one tablet by mouth once a day  Creon 24000, three tablets by mouth three times a day  Protonix 40 mg by mouth once a day  Aldactone 50 mg by mouth once a day  Klor-con 20 meq one tablet by mouth twice a day   HPI  Review of Systems        Objective:    Physical Exam  Constitutional:       General: She is not in acute distress.     Appearance: She is well-developed. She is not diaphoretic.   HENT:      Head: Normocephalic and atraumatic.   Eyes:      General: No scleral icterus.        Right eye: No discharge.         Left eye: No discharge.      Conjunctiva/sclera: Conjunctivae normal.   Neck:      Trachea: No tracheal deviation.   Cardiovascular:      Rate and Rhythm: Normal rate and regular rhythm.      Pulses: Normal pulses.      Heart sounds: Normal  heart sounds.   Pulmonary:      Effort: Pulmonary effort is normal. No respiratory distress.      Breath sounds: Normal breath sounds. No stridor. No wheezing or rales.   Abdominal:      General: Bowel sounds are normal.      Palpations: Abdomen is soft.      Tenderness: There is no abdominal tenderness. There is no guarding.      Comments: Abdominal girth 36 inches at the level of the umbilicus   Musculoskeletal: Normal range of motion.         General: No swelling or tenderness.      Right lower leg: No edema.      Left lower leg: No edema.      Comments: MAEW. Rises from chair independently. Ambulates without difficulty.   Skin:     General: Skin is warm and dry.      Coloration: Skin is not jaundiced or pale.      Findings: No rash.   Neurological:      Mental Status: She is alert and oriented to person,  place, and time.      Coordination: Coordination normal.      Gait: Gait normal.   Psychiatric:         Mood and Affect: Mood normal.         Behavior: Behavior normal.         Thought Content: Thought content normal.         Judgment: Judgment normal.      Comments: Pleasant; calm.       BP 138/82 (BP Site: Left arm, Patient Position: Sitting, Cuff Size: Large) Comment: taken with manual cuff   Pulse 60    Temp 98.2 F (36.8 C) (Oral)    Resp 18    Ht 1.676 m (5\' 6" )    Wt 69.4 kg (153 lb)    SpO2 95% Comment: Room air   BMI 24.69 kg/m      8 office today resulted; sodium 136, potassium 3.3, chloride 99, ionized calcium 5, ionized CO2; 22, glucose 155, BUN 4, creatinine 0.8, hemoglobin 12.6, hematocrit 37.        Assessment:       Follow up  Hypokalemia; improved  Taking Daily, potassium sparing diuretic therapy  Taking PRN, potassium wasting diuretic therapy  History of pancreatitis  Hypertension; controlled   Health education       Plan:       I met with patient.  Reviewed her chem 8 results in the office today; hypokalemia has improved.  She reports she has adequate supply of her medications.   Reviewed  each of her medications, rationale for each one, and reinforced:  Aldactone 50 mg by mouth once a day consistently  Klor-con 20 meq by mouth twice a day consistently  Lasix 40 mg by mouth once a day PRN for peripheral swelling or weight gain.  Instructed on days that she takes a Lasix tablet, she will need to take Klor-con 20 meq by mouth three times a day, and the rationale for this.  She verbalizes understanding.  Ensured that she has lab referral for serum magnesium level, chem 8 and CBC and instructed her to get this done on 10-30-2018.  She verbalizes understanding.  Provided her with a map of the [facility] campus and the location/hours of operation for the [facility] lab site.  Instructed her to call the transition clinic to obtain lab results on 10-31-2018 and ensured that she has phone numbers to the transition clinic to call. She verbalizes understanding.  Re-reviewed her lab results from recent hospitalization; risks of bleeding associated with low platelet count; importance of fall prevention; signs of bleeding to be alert for and seek medical attention.   I reviewed the functions of the liver; the functions of the pancreas; and the potential short/long term complications associated with continued alcohol intake.  She verbalizes understanding.  I reviewed rationale for engaging in support services for substance use; and offered for her to meet with Licensed Social Worker at the transition clinic today to review resources available for stopping alcohol intake.   The patient declines to meet with LSW today.  She reports she met with LSW while she was in the hospital in July 2020, and has paper work at home with contact information to inpatient, and outpatient resources.   Ensured that she has phone number to reach LSW at the transition clinic if she changes her mind.  She has follow up appointment at transition clinic on 11-12-2018.  She has appointment to establish primary care on  12-04-2018.  I will put referral in for  Gastroenterology office in [place], [place].  Reviewed signs to seek medical attention.  Printed out AVS; reviewed this with the patient and provided patient with a copy.  Ensured that the patient has phone number to Transition clinic to call if questions arise, if prescription refills are needed, and/or if appointment at [facility] is needed prior to 11-12-2018.  Patient verbalizes understanding of plan of care, and agrees with plan of care.

## 2018-10-31 ENCOUNTER — Telehealth (INDEPENDENT_AMBULATORY_CARE_PROVIDER_SITE_OTHER): Payer: Self-pay

## 2018-10-31 ENCOUNTER — Telehealth (INDEPENDENT_AMBULATORY_CARE_PROVIDER_SITE_OTHER): Payer: Self-pay | Admitting: Nurse Practitioner

## 2018-10-31 DIAGNOSIS — Z79899 Other long term (current) drug therapy: Secondary | ICD-10-CM

## 2018-10-31 NOTE — Telephone Encounter (Signed)
Called spoke with her  State she just woke up and has not taken her medications yet. Informed the NP has been trying to reach her to discuss labs that here K+ was 5.3. she wanted a repeat  lab Monday and she was to hold off taking  KCL till then .She wanted to know if it was safe not to  Take any cause she  has had problem with low Potassium for years . I provided her with NP direct phone # to call her and I have updated the NP

## 2018-10-31 NOTE — Telephone Encounter (Addendum)
Attempted to call and review labs ( collected 10/30/2018)  with [name]. Her potassium Level is elevated at 5.3.  At this time I  recommend discontinue potassium supplement and repeat labs on Monday.    Medication includes a daily aldactone and this  can retain  potassium -  If she is not taking lasix she may not need potassium at this time.    Medication list includes Aldactone 50 mg daily, Lasix as needed and potassium 20 meq twice a day and an extra one if she takes lasix.   No answer and Unable to leave VM.   ( 10/23/2018 potassium level 3.3)       Spoke with [name] via telephone. Reviewed lab work with pt.   She is apprehensive about holding potassium until Monday as her potassium levels drop easily. Therefore recommend holding potassium today and tomorrow. Saturday Resume taking potassium 20 meq daily   but only one tablet daily. Go to the lab Monday morning for repeat potassium level. She agrees with plan.   She denies needing to take  Lasix and reports taking aldactone daily.

## 2018-10-31 NOTE — Addendum Note (Signed)
Addended by: Riki Rusk on: 10/31/2018 02:37 PM     Modules accepted: Orders

## 2018-11-05 ENCOUNTER — Telehealth (INDEPENDENT_AMBULATORY_CARE_PROVIDER_SITE_OTHER): Payer: Self-pay | Admitting: Family

## 2018-11-06 ENCOUNTER — Encounter (INDEPENDENT_AMBULATORY_CARE_PROVIDER_SITE_OTHER): Payer: Self-pay

## 2018-11-06 ENCOUNTER — Telehealth (INDEPENDENT_AMBULATORY_CARE_PROVIDER_SITE_OTHER): Payer: Self-pay

## 2018-11-06 NOTE — Telephone Encounter (Signed)
Received the following email from Marianne Sofia, NP:    Can you please check to see if any lab work (potassium) result has come in on [name], DOB: September 21, 2060 from [facility]?  Fannie Knee had ordered a follow up serum potassium level to be done today.  I was up front office earlier and did not see any incoming fax.  If no fax, can you please call [facility] for results?  I tried to call her yesterday, but could not reach her.  Can you please try to contact patient today?    Please let me know if you can not reach her, and I will mail her a letter.    11/06/2018 0940  Telephone call placed to patient, no answer  Voicemail is not set up.    Per [facility], they do not have any results for the patient.  I sent a message via MyChart for her to contact the Transition Clinic

## 2018-11-07 ENCOUNTER — Encounter: Payer: Self-pay | Admitting: Internal Medicine

## 2018-11-07 NOTE — Telephone Encounter (Signed)
Have unsuccessful phone call attempts with the patient to remind her to have follow up lab work (potassium level) checked.  I drafted and mailed her a letter with this information and copy of lab referral and contact information to the transition clinic.  She has follow up appointment at the transition clinic on 11-12-2018.

## 2018-11-12 ENCOUNTER — Encounter (INDEPENDENT_AMBULATORY_CARE_PROVIDER_SITE_OTHER): Payer: Self-pay | Admitting: Family

## 2018-11-12 ENCOUNTER — Ambulatory Visit (INDEPENDENT_AMBULATORY_CARE_PROVIDER_SITE_OTHER): Payer: Medicaid Other | Admitting: Family

## 2018-11-12 VITALS — BP 107/80 | HR 83 | Temp 98.2°F | Resp 17 | Ht 66.0 in | Wt 151.2 lb

## 2018-11-12 DIAGNOSIS — Z8719 Personal history of other diseases of the digestive system: Secondary | ICD-10-CM

## 2018-11-12 DIAGNOSIS — Z7289 Other problems related to lifestyle: Secondary | ICD-10-CM

## 2018-11-12 DIAGNOSIS — E871 Hypo-osmolality and hyponatremia: Secondary | ICD-10-CM

## 2018-11-12 DIAGNOSIS — I10 Essential (primary) hypertension: Secondary | ICD-10-CM

## 2018-11-12 DIAGNOSIS — Z719 Counseling, unspecified: Secondary | ICD-10-CM

## 2018-11-12 DIAGNOSIS — R110 Nausea: Secondary | ICD-10-CM

## 2018-11-12 DIAGNOSIS — Z789 Other specified health status: Secondary | ICD-10-CM

## 2018-11-12 NOTE — Progress Notes (Signed)
Subjective:    Patient ID: [name] is a 58 y.o. female.   She presents to the transition clinic for a follow up appointment until she has primary care.     She acknowledges that she has appointment to establish primary care on 12-04-2018, and appointment with [facility] on December 27, 2018.   I review that I have attempted phone contact with her but no voice mail is set up for her phone number: [phone] and I can not leave a message; she acknowledges this to be true. She instructs that phone contact with her can be attempted by calling phone number: [phone] that has voice mail set up and she acknowledges that she got a message from me on that number but did not call me back because she knew she had this appointment scheduled today.  She reports she went to lab yesterday and had blood samples taken.  Transition clinic has not received incoming results for lab work completed on 11-11-2018.     On 10-31-2018, she reports she received phone call front transition clinic with her lab results; serum potassium elevated at 5.3.  Since 10-31-2018, she reports she held her potassium supplement for two days, and then she decided to resume taking Potassium 20 meq by mouth twice a day, as opposed the the instruction she was provided for resuming Potassium 20 meq by mouth once a day.    She reports she has not had any peripheral edema, abdominal edema or weight gain, and she has not taken any PRN Lasix since 10-20-2018.  She denies muscle cramping, palpitations, congestion, productive cough, sore throat, fever, chills, dizziness or falls.  Denies new bruising or bleeding.    She reports about 10 days ago, she had episode of left sided chest pain with walking up a flight of stairs; reports the pain radiated to her back and was associated with mild SOB; denies diaphoresis, nausea or vomiting with the episode. She reports she rested and the pain and SOB spontaneously resolved within 5 minutes.  She  denies use of medications/OTC products with the episode.   She reports she has been feeling well since that episode and reports she mowed her lawn yesterday using a push mower and denies chest pain or SOB with that activity.   She admits, she has been drinking more water than usual, with warmer weather; taking in consistent meals without abdominal pain, vomiting or diarrhea.  She reports intermittent nausea, and now reports this is chronic, and is relieved with PRN Zofran.  Reports is moving her bowels and denies blood in stool.  She reports she continues to consume alcohol occasionally; denies daily intake. She denies tremors, seizure activity, confusion or changes in mental status.   Continues smoking cigarettes; reports she is down to less than 1/2 PPD.   She denies current complaints of pain.    She does not bring her medication bottles with her today, but reports she is taking the following medications:  Proventil inhaler 2 puffs every 6 hours PRN wheezing  Norvasc 5 mg by mouth once a day   Lasix 40 mg by mouth once a day PRN peripheral edema or weight gain (reports most recent dose was 10-20-2018)  MVI one tablet by mouth once a day  Zofran 4 mg every 8 hours PRN nausea   Creon 24000, three tablets by mouth three times a day  Protonix 40 mg by mouth once a day  Aldactone 50 mg by mouth once a day  Klor-con 20 meq one tablet by mouth twice a day   She now admits she has been taking Sodium Bicarbonate 650 mg by mouth once a day and reports she has continued taking this following hospital discharge on 10-17-2018 even though her hospital discharge instructions were to stop the Sodium Bicarbonate.  She reports her previous primary care provider, Dr. Mikey Bussing, had been prescribing Sodium Bicarbonate 650 mg by mouth three times a day for her chronic hyponatremia and reports she will not completely stop this medication.   She denies history of renal failure or renal disease.   HPI  Review of Systems        Objective:     Physical Exam  Constitutional:       General: She is not in acute distress.     Appearance: She is well-developed. She is not ill-appearing, toxic-appearing or diaphoretic.   HENT:      Head: Normocephalic and atraumatic.   Eyes:      General: No scleral icterus.        Right eye: No discharge.         Left eye: No discharge.      Conjunctiva/sclera: Conjunctivae normal.   Neck:      Trachea: No tracheal deviation.   Cardiovascular:      Rate and Rhythm: Normal rate and regular rhythm.      Pulses: Normal pulses.      Heart sounds: Normal heart sounds.   Pulmonary:      Effort: Pulmonary effort is normal. No respiratory distress.      Breath sounds: Normal breath sounds. No stridor. No wheezing or rales.   Abdominal:      General: Bowel sounds are normal.      Palpations: Abdomen is soft.      Tenderness: There is no abdominal tenderness. There is no guarding.   Musculoskeletal: Normal range of motion.         General: No swelling or tenderness.      Right lower leg: No edema.      Left lower leg: No edema.      Comments: MAEW. Rises from chair independently. Ambulates without difficulty.   Skin:     General: Skin is warm and dry.      Coloration: Skin is not jaundiced or pale.      Findings: No rash.      Comments: Previous bruising on arms has healed   Neurological:      Mental Status: She is alert and oriented to person, place, and time.      Coordination: Coordination normal.      Gait: Gait normal.   Psychiatric:         Mood and Affect: Mood normal.         Behavior: Behavior normal.         Thought Content: Thought content normal.         Judgment: Judgment normal.      Comments: Pleasant; calm.       BP 107/80 (BP Site: Left arm, Patient Position: Sitting, Cuff Size: Large)    Pulse 83    Temp 98.2 F (36.8 C) (Oral)    Resp 17    Ht 1.676 m (5\' 6" )    Wt 68.6 kg (151 lb 3.2 oz)    SpO2 97% Comment: Room air   BMI 24.40 kg/m     Her weight on 10-22-2018:  153 pounds.      On 11-11-2018,  serum WBC: 5.6,  hemoglobin: 11.3, hematocrit:  32.6, platelet count: 164; glucose: 97, BUN: 10, creatinine: 1, eGFR: 62, sodium: 126; potassium: 4.2; chloride: 93; carbon dioxide: 16; calcium: 9.5; magnesium: 1.6.        Assessment:       Follow up  Hyponatremia  Alcohol use  History of pancreatitis  Chronic nausea, without vomiting  Anemia   Hypertension; controlled        Plan:       I met with patient and reinforced the role of the transition clinic, and importance of keeping her upcoming appointments with health care providers. She verbalizes understanding and plans to do this.  The transition clinic called [facility] and received incoming fax with lab results from 11-11-2018.  I reviewed these results with the patient; reviewed what hyponatremia is; potential causes and rationale for fluid restriction of less than 1,000 ml a day and I reviewed this is less than four, 8 ounce servings of fluid a day.  She verbalizes understanding.     Instructed NOT to take in extra table salt, and rationale. She verbalizes understanding.  (She reports that once, in the past, when her sodium level was low, she added salt to water and drank four glasses, thinking this would fix her low sodium level and acknowledges that Dr. Mikey Bussing had to follow her electrolytes closely during that time. She denies tremors, seizures or confusion with that event.)  I have reviewed the rationale for use of oral sodium bicarbonate and side effects; she verbalizes understanding and reports she will not stop this medication.  She plans to continue taking 650 mg of Sodium bicarbonate by mouth once a day.     I reviewed signs of low sodium to be alert for and if she develops any symptom, to call 911 for immediate medical attention and getting to nearest emergency room for further evaluation and rationale for this.   She verbalizes understanding.  I scheduled her to return to transition clinic on 11-19-2018 at 11:30am for follow up and chem 8 to recheck her sodium.   I  reinforced the rationale for decreasing her alcohol intake with ultimate goal of abstinence from alcohol intake; and reviewed short/long term complications of continued alcohol intake.  She verbalizes understanding and reports she has information on community resources for support with this.  I reviewed her risk factors for CV event; and reviewed that if she develops chest pain, she needs to call 911 and seek immediate medical attention and rationale for this.  She verbalizes understanding.  Praised for efforts with reducing her cigarette smoking and encouraged to continue decreasing this.  Reviewed her upcoming appointments.  Printed out AVS; reviewed this with the patient and provided patient with a copy.  Reviewed signs to seek medical attention.  Ensured that the patient has phone number to Transition clinic to call if questions arise, or if appointment at [facility] is needed prior to 11-19-2018.  Patient verbalizes understanding of plan of care, and agrees with plan of care.

## 2018-11-12 NOTE — Patient Instructions (Signed)
Your lab results from yesterday show that your potassium level is normal.   Continue with your current dose of potassium replacement.    Your sodium level was low.  Please follow a fluid restriction and limit your fluid intake to less than four, 8 ounce glasses of fluid a day.  (this is a less than 1,077ml fluid restriction per day).       If you have any changes in mental status, confusion, dizziness, muscle cramping or twitching, then seek immediate medical attention by going to the nearest emergency room for further evaluation.    Stopping alcohol consumption is important part of improving your health.  Please contact Orvilla Cornwall, LSW at the transition clinic if you need contact information/resources for this.    If you develop chest pain, you need to call 911 and seek medical attention by going to the nearest Emergency Room for further evaluation.      You have a follow up appointment at the transition clinic on 11-19-2018 at 11:30am.  A follow up chem 8 will be done at that time to recheck your sodium level and electrolytes and kidney function.    If you have questions, or need an appointment prior to 11-19-2018, then please call the transition clinic at [phone].    Please review the following:      Hyponatremia  Hyponatremia means low sodium levels in the blood. This condition most often occurs after prolonged vomiting or diarrhea, which causes your body to lose too much water and sodium. It can also result from drinking excess amounts of water or the use of diuretics (water pills). Rarely, it can be associated with disorders of your endocrine system, as side effects of illicit drug use (ecstasy), as a complication of some cancers especially small cell lung cancer, or as a complication of renal and liver disease or heart failure.  Mild hyponatremia causes no symptoms. It is only discovered with a blood test. As sodium levels in the blood decreases, symptoms begin to appear. This includes weakness,  confusion, muscle cramping and seizures.  Home care   Reduce your daily water intake until the problem is corrected.   If you have been taking diuretics, you may be asked to stop taking them for a short time.   If you are having symptoms of weakness or confusion, do not drive or operate dangerous machinery until symptoms resolve.   If your sodium levels are too low to be managed at home with the above recommendations, you will be asked to go to the hospital to have your sodium replaced through your vein.  Follow-up care  Follow up with yourhealthcare providerfor a repeat blood test within the next week, or as advised.  When to seek medical advice  Call your healthcare provider if any of the following occur:   Increasing weakness   Dizziness   Irregular heartbeat, extra beats or very fast heart rate   Increasing confusion   Fainting or loss of consciousness   Seizure  StayWell last reviewed this educational content on 10/09/2015   2000-2020 [employer], [place]. [address], [place], [place] [address]. All rights reserved. This information is not intended as a substitute for professional medical care. Always follow your healthcare professional's instructions.      Sodium (Blood)  Does this test have other names?  Na test  What is this test?  This test measures the levels of sodium in your blood. Sodium is a substance your body's cells need to  work normally. Sodium helps make sure that your nerves and muscles can work as they should. Sodium is also important because it helps maintain the right balance of fluids in your body. The kidneys help keep sodium at a healthy level. You can get the sodium you need through your diet.But it's easy to take in too much sodium through your diet. When your body has too much sodium, your kidneys can't remove enough of it. Sodium collects in your bloodstream. This can lead to high blood pressure, which can cause other problems.  Too much sodium in the blood is called  hypernatremia. Too little sodium in the blood called hyponatremia. Hypernatremia can occur when you lose too many fluids. This can happen from sweating too much, vomiting, or diarrhea. Hyponatremia can occur when you drink large amounts of water or if you have problems with your kidneysthat affect your ability to urinate.  Why do I need this test?  You may need this test if your healthcare provider thinks you have an imbalance of fluids and sodium. You may have symptoms such as:   Problems with mental or cognitive function   Muscle cramps or twitching   Cravings for large amounts of salt   Confusion or forgetfulness   Problems with walking   General unwell feeling   Nausea   Tiredness (fatigue)   Headaches   Shortness of breath   Fluid buildup or swelling in part of the body  The test can check for:   Diabetes that's not controlled well   Kidney problems, including advanced kidney failure  Or you may need this test if you:   Are taking certain medicines such as water pills (diuretics)   Are having sodium therapy   Lost a large amounts of bodily fluids  You may also have this test as part of a routine health check.  What other tests might I have along with this test?  You may need other tests along with a sodium blood test. You may have tests to look at:   Other electrolyte levels in your blood, such as potassium   Concentration ofyoururine   Level of sodium inyoururine   Concentration ofyourblood   Levels of uric acid and urea   Acid-base balance inyourblood  What do my test results mean?  Test results may vary depending on your age, gender, health history, the method used for the test, and other things. Your test results may not mean you have a problem. Ask your healthcare provider what your test results mean for you.  Normal sodium levels are usually between 136 and 145 millimoles per liter (mmol/L). Blood sodium levels below 136 mmol/L may mean you have low blood sodium  (hyponatremia). Blood sodium levels greater than 145 mmol/L may mean you have blood sodium levels that are too high (hypernatremia).  How is this test done?  The test is done with a blood sample. A needle is used to draw blood from a vein in your arm or hand.  Does this test pose any risks?  Having a blood test with a needle carries some risks. These include bleeding, infection, bruising, and feeling lightheaded. When the needle pricks your arm or hand, you may feel a slight sting or pain. Afterward, the site may be sore.  If your blood sample is collected incorrectly, your test results may be affected. Having high blood sugar (hyperglycemia) can also affect your test results.  Taking some medicines can also affect your test results. These include diuretics  and nonsteroidal anti-inflammatory medicines such as ibuprofen.  How do I get ready for this test?  Your healthcare provider will tell you what you need to do before this test. You may need to not have food or water for several hours before the test. You may need to not take some of your medicines on the day of the test. Be sure your healthcare provider knows about all medicines, herbs, vitamins, and supplements you are taking. This includes medicines that don't need a prescription and any illegal drugs you may use.  StayWell last reviewed this educational content on 05/11/2016   2000-2020 [employer], [place]. [address] [place], [place] [address]. All rights reserved. This information is not intended as a substitute for professional medical care. Always follow your healthcare professional's instructions.

## 2018-11-14 DIAGNOSIS — R110 Nausea: Secondary | ICD-10-CM | POA: Insufficient documentation

## 2018-11-15 ENCOUNTER — Other Ambulatory Visit (INDEPENDENT_AMBULATORY_CARE_PROVIDER_SITE_OTHER): Payer: Self-pay | Admitting: Family

## 2018-11-15 DIAGNOSIS — I10 Essential (primary) hypertension: Secondary | ICD-10-CM

## 2018-11-15 DIAGNOSIS — Z760 Encounter for issue of repeat prescription: Secondary | ICD-10-CM

## 2018-11-19 ENCOUNTER — Encounter (INDEPENDENT_AMBULATORY_CARE_PROVIDER_SITE_OTHER): Payer: Self-pay | Admitting: Family

## 2018-11-19 ENCOUNTER — Telehealth (INDEPENDENT_AMBULATORY_CARE_PROVIDER_SITE_OTHER): Payer: Self-pay | Admitting: Family

## 2018-11-19 ENCOUNTER — Ambulatory Visit (INDEPENDENT_AMBULATORY_CARE_PROVIDER_SITE_OTHER): Payer: Medicaid Other | Admitting: Family

## 2018-11-19 VITALS — BP 106/78 | HR 88 | Temp 98.1°F | Resp 16 | Ht 66.0 in | Wt 151.0 lb

## 2018-11-19 DIAGNOSIS — R110 Nausea: Secondary | ICD-10-CM

## 2018-11-19 DIAGNOSIS — R079 Chest pain, unspecified: Secondary | ICD-10-CM

## 2018-11-19 DIAGNOSIS — Z760 Encounter for issue of repeat prescription: Secondary | ICD-10-CM

## 2018-11-19 DIAGNOSIS — E871 Hypo-osmolality and hyponatremia: Secondary | ICD-10-CM

## 2018-11-19 DIAGNOSIS — R0602 Shortness of breath: Secondary | ICD-10-CM

## 2018-11-19 LAB — VH AMB POCT CHEM8 ISTAT
Anion Gap POCT: 20 mmol/L — AB (ref 7.0–16.0)
BUN POCT: 9 mg/dL (ref 7.0–22.0)
CO2 POCT: 19 mmol/L — AB (ref 24.0–29.0)
Calcium Ionized POCT: 4.8 mg/dL (ref 4.35–5.10)
Chloride POCT: 93 mmol/L — AB (ref 98–110)
Creatinine POCT: 1.2 mg/dL (ref 0.60–1.20)
EGFR POCT: 50 mL/min/{1.73_m2} — AB (ref 60–150)
Glucose POCT: 138 mg/dL — AB (ref 71–99)
Hematocrit POCT: 44 % (ref 36.0–48.0)
Hemoglobin POCT: 15 gm/dL (ref 12.0–16.0)
Potassium POCT: 4.4 mmol/L (ref 3.5–5.3)
Sodium POCT: 126 mmol/L — AB (ref 136–147)

## 2018-11-19 MED ORDER — ONDANSETRON 4 MG PO TBDP
4.00 mg | ORAL_TABLET | Freq: Two times a day (BID) | ORAL | 0 refills | Status: DC | PRN
Start: 2018-11-19 — End: 2019-04-15

## 2018-11-19 NOTE — Patient Instructions (Addendum)
Your sodium level was the same today, 126.  This is below the normal. Please continue to follow a fluid restriction and limit your fluid intake  to less than four, 8 ounce glasses of fluid a day. (this is a less than 1,030ml fluid restriction per day).    If you have any changes in mental status, confusion, dizziness, muscle cramping or twitching, then seek immediate medical attention by going to the nearest emergency room for further evaluation.    Stopping alcohol consumption is important part of improving your health. Please  contact Orvilla Cornwall, LSW at the transition clinic if you need contact information/resources for this.    If you develop chest pain and/or shortness of breath that do/does not resolve within 5 minutes of rest, then you need to call 911 and seek medical attention by going to the nearest Emergency Room for further evaluation.     You will be referred for outpatient cardiac stress testing.  Once this has been scheduled, the transition clinic will attempt to contact you by phone to review the details.     You have a follow up appointment at the transition clinic on 11-26-2018 at 10:00am and a follow up chem 8 will be done at that time to recheck your sodium level and electrolytes and kidney function.  If you have questions, or need an appointment prior to 11-26-2018, then please call  the transition clinic at [phone].    Please review the following:      Hyponatremia  Hyponatremia means low sodium levels in the blood. This condition most often occurs after prolonged vomiting or diarrhea, which causes your body to lose too much water and sodium. It can also result from drinking excess amounts of water or the use of diuretics (water pills). Rarely, it can be associated with disorders of your endocrine system, as side effects of illicit drug use (ecstasy), as a complication of some cancers especially small cell lung cancer, or as a complication of renal and liver disease or heart  failure.  Mild hyponatremia causes no symptoms. It is only discovered with a blood test. As sodium levels in the blood decreases, symptoms begin to appear. This includes weakness, confusion, muscle cramping and seizures.  Home care   Reduce your daily water intake until the problem is corrected.   If you have been taking diuretics, you may be asked to stop taking them for a short time.   If you are having symptoms of weakness or confusion, do not drive or operate dangerous machinery until symptoms resolve.   If your sodium levels are too low to be managed at home with the above recommendations, you will be asked to go to the hospital to have your sodium replaced through your vein.  Follow-up care  Follow up with yourhealthcare providerfor a repeat blood test within the next week, or as advised.  When to seek medical advice  Call your healthcare provider if any of the following occur:   Increasing weakness   Dizziness   Irregular heartbeat, extra beats or very fast heart rate   Increasing confusion   Fainting or loss of consciousness   Seizure  StayWell last reviewed this educational content on 10/09/2015   2000-2020 [employer], [place]. [address] [place], [place] [address]. All rights reserved. This information is not intended as a substitute for professional medical care. Always follow your healthcare professional's instructions.

## 2018-11-19 NOTE — Progress Notes (Signed)
Subjective:    Patient ID: [name] is a 58 y.o. female.   She presents to the transition clinic for follow up chem 8, to follow her hyponatremia.    In her Epic record, her serum sodium level has ranged 119 - 137, since April 2020.   On 11-11-2018, her serum sodium level was 126 and she had been consuming extra water due to warm temperatures and wanting to stay hydrated.  She also admitted she had been taking sodium bicarbonate 650 mg by mouth once a day and declines to stop taking this medication reporting her previous PCP had her on sodium bicarbonate 650 mg three times a day to treat her chronic hyponatremia.   She was instructed on fluid restriction less than 32 ounces a day.      Since her appointment with the transition clinic on 11-12-2018, she denies muscle twitching or cramping, tremors, seizure activity, changes in mental status, or falls.   She reports she had to take her roommate to the ED at the [facility] yesterday; reports she sat in the waiting room for 6 hours and during that time, when she went from sitting to standing position, she had mild dizziness.  She denies dizziness today.  Denies headache, vision changes, or muscle weakness.   She reports she is following fluid restriction; reports she has decreased her alcohol intake down to 2 servings a week.  She denies weight gain and denies noting peripheral edema or abdominal edema.  Denies use of PRN Lasix.   She continues with same, intermittent nausea; no active vomiting. She is requesting refill on Zofran PRN for nausea.  She denies diarrhea or blood in stool; reports his having routine BM.  She denies abdominal pain, dysuria, hematuria, new bruising, bleeding, fever or chills.   She reports she has had to episodes of left sided chest pain with mild SOB after climbing up home stairs; symptoms resolved within 5 minutes of resting.   She denies current complaint of chest pain or discomfort.  She continues smoking  cigarettes; 1/2 PPD and denies illicit drug use.   She does not bring her medication bottles with her today, but reports she is taking the following medications:  Proventil inhaler 2 puffs every 6 hours PRN wheezing  Norvasc 5 mg by mouth once a day   Lasix 40 mg by mouth once a day PRN peripheral edemaor weight gain(reports most recent dose was 10-20-2018)  MVI one tablet by mouth once a day  Zofran 4 mg every 8 hours PRN nausea (requesting refill)  Creon 24000, three tablets by mouth three times a day  Protonix 40 mg by mouth once a day  Aldactone 50 mg by mouth once a day  Klor-con 20 meqone tablet by mouth twice a day    She reports she has appointment for primary care on 12-04-2018 and appointment with [facility] on 12-27-2018.   HPI  Review of Systems        Objective:    Physical Exam  Constitutional:       General: She is not in acute distress.     Appearance: She is well-developed. She is not ill-appearing, toxic-appearing or diaphoretic.   HENT:      Head: Normocephalic and atraumatic.   Eyes:      General: No scleral icterus.        Right eye: No discharge.         Left eye: No discharge.  Conjunctiva/sclera: Conjunctivae normal.   Neck:      Trachea: No tracheal deviation.   Cardiovascular:      Rate and Rhythm: Normal rate and regular rhythm.      Pulses: Normal pulses.      Heart sounds: Normal heart sounds.   Pulmonary:      Effort: Pulmonary effort is normal. No respiratory distress.      Breath sounds: Normal breath sounds. No stridor. No wheezing or rales.   Abdominal:      General: Bowel sounds are normal.      Palpations: Abdomen is soft.      Tenderness: There is no abdominal tenderness. There is no guarding.   Musculoskeletal: Normal range of motion.         General: No tenderness.      Right lower leg: No edema.      Left lower leg: No edema.      Comments: MAEW. Rises from chair independently. Ambulates without difficulty.   Skin:     General: Skin is warm and dry.       Coloration: Skin is not jaundiced or pale.      Findings: No rash.   Neurological:      Mental Status: She is alert and oriented to person, place, and time.      Coordination: Coordination normal.      Gait: Gait normal.   Psychiatric:         Mood and Affect: Mood normal.         Behavior: Behavior normal.         Thought Content: Thought content normal.         Judgment: Judgment normal.      Comments: Pleasant; calm; engaged in conversing and answering questions.        BP 106/78 (BP Site: Left arm, Patient Position: Sitting, Cuff Size: Medium) Comment: manual   Pulse 88    Temp 98.1 F (36.7 C) (Oral)    Resp 16    Ht 1.676 m (5\' 6" )    Wt 68.5 kg (151 lb)    SpO2 98%    BMI 24.37 kg/m     Chem 8 results in the office today:  Sodium: 126, potassium: 4.4; chloride:  93; ionized calcium: 4.8; PC02:  19; glucose: 138; BUN: 9; creatinine: 1.2; hematocrit: 44; hemoglobin;  15.       Assessment:       1. Hyponatremia    2. Exertional chest pain    3. SOB (shortness of breath) on exertion    4. Chronic nausea    5. Prescription refill    Health education       Plan:       I met with patient; reviewed her chem 8 results in the office today.  Reviewed what hyponatremia is and factors that can cause this and rationale for my instructions to stop sodium bicarbonate.   She verbalizes understanding  Instructed her to continue same fluid restriction and rationale for this. I reviewed signs of hyponatremia to be alert for and seek immediate medical attention for further evaluation by going to ED. She verbalizes understanding.  Reviewed importance of fall prevention; changing position slowly.  I reviewed her CV risk factors and will refer her to cardiology for further evaluation of her exertional chest pain and SOB.  I reviewed lifestyle modifications to reduce her risk for CV events and reviewed signs to seek immediate medical attention by calling 911.  She  verbalizes understanding.  Praised her for efforts with decreasing  her alcohol intake; offered for her to meet with transition clinic LSW today and she declines.    I will refill her PRN Zofran for limited supply.  I scheduled her to return to transition clinic on 11-26-2018 at 10am for follow up with chem 8 to follow her sodium and C02.    Reviewed signs to seek medical attention.  Printed out AVS; reviewed this with the patient and provided patient with a copy.  Ensured that the patient has phone number to Transition clinic to call if questions arise, or if appointment at [facility] is needed prior to 11-26-2018.  Patient verbalizes understanding of plan of care, and agrees with plan of care.  She reports she has set up her voice mail on her phone #[phone] and prefers this phone number to be called for contact with her.

## 2018-11-22 NOTE — Telephone Encounter (Signed)
Attempting phone contact with patient to review appointment made with cardiology on 12-11-2018 and referral has been sent for her to obtain outpatient EKG at the [facility].   I have left messages on her voice mail with this information.  She has follow up appointment at the transition clinic on 11-26-2018 and she was reminded of this appointment of her voice mail.  Requested return call back to me at the transition clinic to confirm information.

## 2018-11-26 ENCOUNTER — Telehealth (INDEPENDENT_AMBULATORY_CARE_PROVIDER_SITE_OTHER): Payer: Self-pay

## 2018-11-26 ENCOUNTER — Ambulatory Visit (INDEPENDENT_AMBULATORY_CARE_PROVIDER_SITE_OTHER): Payer: Medicaid Other | Admitting: Family

## 2018-11-26 NOTE — Telephone Encounter (Signed)
Patient was a No Show for their Transition Clinic follow up appt.   Telephone call placed to patient, no answer  Left a voicemail for patient to call back and r/s missed appt

## 2018-11-27 ENCOUNTER — Inpatient Hospital Stay
Admission: EM | Admit: 2018-11-27 | Discharge: 2018-12-01 | DRG: 282 | Disposition: A | Discharge status: Home / Self Care | Payer: Medicaid Other | Attending: Internal Medicine | Admitting: Internal Medicine

## 2018-11-27 ENCOUNTER — Emergency Department: Payer: Medicaid Other

## 2018-11-27 DIAGNOSIS — K2920 Alcoholic gastritis without bleeding: Secondary | ICD-10-CM | POA: Diagnosis present

## 2018-11-27 DIAGNOSIS — E878 Other disorders of electrolyte and fluid balance, not elsewhere classified: Secondary | ICD-10-CM | POA: Diagnosis present

## 2018-11-27 DIAGNOSIS — E872 Acidosis: Secondary | ICD-10-CM | POA: Diagnosis present

## 2018-11-27 DIAGNOSIS — D696 Thrombocytopenia, unspecified: Secondary | ICD-10-CM | POA: Diagnosis present

## 2018-11-27 DIAGNOSIS — Z86010 Personal history of colonic polyps: Secondary | ICD-10-CM

## 2018-11-27 DIAGNOSIS — Z86718 Personal history of other venous thrombosis and embolism: Secondary | ICD-10-CM

## 2018-11-27 DIAGNOSIS — I10 Essential (primary) hypertension: Secondary | ICD-10-CM | POA: Diagnosis present

## 2018-11-27 DIAGNOSIS — E871 Hypo-osmolality and hyponatremia: Secondary | ICD-10-CM | POA: Diagnosis present

## 2018-11-27 DIAGNOSIS — K219 Gastro-esophageal reflux disease without esophagitis: Secondary | ICD-10-CM | POA: Diagnosis present

## 2018-11-27 DIAGNOSIS — K852 Alcohol induced acute pancreatitis without necrosis or infection: Principal | ICD-10-CM | POA: Diagnosis present

## 2018-11-27 DIAGNOSIS — F1029 Alcohol dependence with unspecified alcohol-induced disorder: Secondary | ICD-10-CM | POA: Diagnosis present

## 2018-11-27 DIAGNOSIS — Z90411 Acquired partial absence of pancreas: Secondary | ICD-10-CM

## 2018-11-27 DIAGNOSIS — Z66 Do not resuscitate: Secondary | ICD-10-CM | POA: Diagnosis present

## 2018-11-27 DIAGNOSIS — W19XXXA Unspecified fall, initial encounter: Secondary | ICD-10-CM | POA: Diagnosis present

## 2018-11-27 DIAGNOSIS — Z9049 Acquired absence of other specified parts of digestive tract: Secondary | ICD-10-CM

## 2018-11-27 DIAGNOSIS — E876 Hypokalemia: Secondary | ICD-10-CM | POA: Diagnosis present

## 2018-11-27 DIAGNOSIS — Z885 Allergy status to narcotic agent status: Secondary | ICD-10-CM

## 2018-11-27 DIAGNOSIS — F17210 Nicotine dependence, cigarettes, uncomplicated: Secondary | ICD-10-CM | POA: Diagnosis present

## 2018-11-27 DIAGNOSIS — F10239 Alcohol dependence with withdrawal, unspecified: Secondary | ICD-10-CM | POA: Diagnosis present

## 2018-11-27 DIAGNOSIS — R112 Nausea with vomiting, unspecified: Secondary | ICD-10-CM

## 2018-11-27 DIAGNOSIS — K860 Alcohol-induced chronic pancreatitis: Secondary | ICD-10-CM | POA: Diagnosis present

## 2018-11-27 DIAGNOSIS — F1020 Alcohol dependence, uncomplicated: Secondary | ICD-10-CM

## 2018-11-27 DIAGNOSIS — S0003XA Contusion of scalp, initial encounter: Secondary | ICD-10-CM | POA: Diagnosis present

## 2018-11-27 DIAGNOSIS — R51 Headache: Secondary | ICD-10-CM

## 2018-11-27 DIAGNOSIS — J45909 Unspecified asthma, uncomplicated: Secondary | ICD-10-CM | POA: Diagnosis present

## 2018-11-27 DIAGNOSIS — D649 Anemia, unspecified: Secondary | ICD-10-CM | POA: Diagnosis present

## 2018-11-27 LAB — CBC AND DIFFERENTIAL
Basophils %: 1.3 % (ref 0.0–3.0)
Basophils Absolute: 0.1 10*3/uL (ref 0.0–0.3)
Eosinophils %: 0.4 % (ref 0.0–7.0)
Eosinophils Absolute: 0 10*3/uL (ref 0.0–0.8)
Hematocrit: 35.3 % — ABNORMAL LOW (ref 36.0–48.0)
Hemoglobin: 12 gm/dL (ref 12.0–16.0)
Lymphocytes Absolute: 1.4 10*3/uL (ref 0.6–5.1)
Lymphocytes: 20.6 % (ref 15.0–46.0)
MCH: 35 pg (ref 28–35)
MCHC: 34 gm/dL (ref 32–36)
MCV: 103 fL — ABNORMAL HIGH (ref 80–100)
MPV: 6.7 fL (ref 6.0–10.0)
Monocytes Absolute: 0.4 10*3/uL (ref 0.1–1.7)
Monocytes: 6.2 % (ref 3.0–15.0)
Neutrophils %: 71.5 % (ref 42.0–78.0)
Neutrophils Absolute: 4.7 10*3/uL (ref 1.7–8.6)
PLT CT: 177 10*3/uL (ref 130–440)
RBC: 3.44 10*6/uL — ABNORMAL LOW (ref 3.80–5.00)
RDW: 16.8 % — ABNORMAL HIGH (ref 11.0–14.0)
WBC: 6.6 10*3/uL (ref 4.0–11.0)

## 2018-11-27 LAB — COMPREHENSIVE METABOLIC PANEL
ALT: 39 U/L (ref 0–55)
AST (SGOT): 87 U/L — ABNORMAL HIGH (ref 10–42)
Albumin/Globulin Ratio: 1.07 Ratio (ref 0.80–2.00)
Albumin: 4.5 gm/dL (ref 3.5–5.0)
Alkaline Phosphatase: 285 U/L — ABNORMAL HIGH (ref 40–145)
Anion Gap: 31.2 mMol/L — ABNORMAL HIGH (ref 7.0–18.0)
BUN / Creatinine Ratio: 13.3 Ratio (ref 10.0–30.0)
BUN: 16 mg/dL (ref 7–22)
Bilirubin, Total: 2.3 mg/dL — ABNORMAL HIGH (ref 0.1–1.2)
CO2: 9.2 mMol/L — CL (ref 20.0–30.0)
Calcium: 9.8 mg/dL (ref 8.5–10.5)
Chloride: 89 mMol/L — ABNORMAL LOW (ref 98–110)
Creatinine: 1.2 mg/dL (ref 0.60–1.20)
EGFR: 50 mL/min/{1.73_m2} — ABNORMAL LOW (ref 60–150)
Globulin: 4.2 gm/dL — ABNORMAL HIGH (ref 2.0–4.0)
Glucose: 71 mg/dL (ref 71–99)
Osmolality Calculated: 251 mOsm/kg — ABNORMAL LOW (ref 275–300)
Potassium: 4.4 mMol/L (ref 3.5–5.3)
Protein, Total: 8.7 gm/dL — ABNORMAL HIGH (ref 6.0–8.3)
Sodium: 125 mMol/L — CL (ref 136–147)

## 2018-11-27 LAB — LIPASE: Lipase: 188 U/L — ABNORMAL HIGH (ref 8–78)

## 2018-11-27 LAB — HEPATIC FUNCTION PANEL
ALT: 38 U/L (ref 0–55)
AST (SGOT): 88 U/L — ABNORMAL HIGH (ref 10–42)
Albumin/Globulin Ratio: 1.1 Ratio (ref 0.80–2.00)
Albumin: 4.6 gm/dL (ref 3.5–5.0)
Alkaline Phosphatase: 279 U/L — ABNORMAL HIGH (ref 40–145)
Bilirubin Direct: 1.2 mg/dL — ABNORMAL HIGH (ref 0.0–0.3)
Bilirubin, Total: 2.3 mg/dL — ABNORMAL HIGH (ref 0.1–1.2)
Globulin: 4.2 gm/dL — ABNORMAL HIGH (ref 2.0–4.0)
Protein, Total: 8.8 gm/dL — ABNORMAL HIGH (ref 6.0–8.3)

## 2018-11-27 LAB — TROPONIN I: Troponin I: 0.02 ng/mL (ref 0.00–0.02)

## 2018-11-27 LAB — B-TYPE NATRIURETIC PEPTIDE: B-Natriuretic Peptide: 39 pg/mL (ref 0.0–100.0)

## 2018-11-27 MED ORDER — LACTATED RINGERS IV SOLN
INTRAVENOUS | Status: DC
Start: 2018-11-27 — End: 2018-11-30

## 2018-11-27 MED ORDER — PROCHLORPERAZINE EDISYLATE 10 MG/2ML IJ SOLN
INTRAMUSCULAR | Status: AC
Start: 2018-11-27 — End: ?
  Filled 2018-11-27: qty 2

## 2018-11-27 MED ORDER — PHENOBARBITAL SODIUM 65 MG/ML IJ SOLN
260.00 mg | Freq: Once | INTRAMUSCULAR | Status: AC
Start: 2018-11-27 — End: 2018-11-27
  Administered 2018-11-27: 23:00:00 260 mg via INTRAVENOUS

## 2018-11-27 MED ORDER — PHENOBARBITAL SODIUM 65 MG/ML IJ SOLN
INTRAMUSCULAR | Status: AC
Start: 2018-11-27 — End: ?
  Filled 2018-11-27: qty 4

## 2018-11-27 MED ORDER — PROCHLORPERAZINE EDISYLATE 10 MG/2ML IJ SOLN
10.00 mg | Freq: Once | INTRAMUSCULAR | Status: AC
Start: 2018-11-27 — End: 2018-11-27
  Administered 2018-11-27: 21:00:00 10 mg via INTRAVENOUS

## 2018-11-27 MED ORDER — SODIUM CHLORIDE 0.9 % IV BOLUS
1000.00 mL | Freq: Once | INTRAVENOUS | Status: AC
Start: 2018-11-27 — End: 2018-11-27
  Administered 2018-11-27: 21:00:00 1000 mL via INTRAVENOUS

## 2018-11-27 MED ORDER — SODIUM CHLORIDE 0.9 % IV SOLN
INTRAVENOUS | Status: DC
Start: 2018-11-27 — End: 2018-11-28

## 2018-11-27 NOTE — ED Provider Notes (Signed)
History     Chief Complaint   Patient presents with    Syncope    Chest Pain   58 year old female with a known history of alcoholism presents with 3 to 4 days of decreased oral intake along with nausea vomiting and inability to tolerate liquids or solids.  She is drinking vodka on a daily basis.  She is a known alcoholic.  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  She denies fevers night sweats or chills.  She states she did fall 2 days ago as a result of a syncopal episode and fell on her face.  She presents with ecchymosis and bruising around both eyes.  She complains of a mild posterior headache.  Rest of the review of systems are negative.          Past Medical History:   Diagnosis Date    Ascites     Asthma without status asthmaticus     Clostridium difficile colitis     Disorder of liver     multifocal cystic irregularities    DVT (deep venous thrombosis)     Gastroesophageal reflux disease     H/O ETOH abuse     sober 2 years until 03/2014    Hypertension     Hypokalemia     Pancreatitis     S/P IVC filter        Past Surgical History:   Procedure Laterality Date    CHOLECYSTECTOMY      COLONOSCOPY  12/26/2012    Procedure: COLONOSCOPY;  Surgeon: Gwenith Spitz, MD;  Location: [facility];  Service: Gastroenterology;  Laterality: N/A;    COLONOSCOPY, POLYPECTOMY  12/26/2012    Procedure: COLONOSCOPY, POLYPECTOMY;  Surgeon: Gwenith Spitz, MD;  Location: [facility];  Service: Gastroenterology;  Laterality: N/A;    DEBRIDEMENT & IRRIGATION, WOUND CLOSURE Right 08/29/2018    Procedure: WASHOUT AND CLOSURE OF TRAUMATIC WOUND, RIGHT FOREARM;  Surgeon: Arnoldo Hooker, MD;  Location: [facility];  Service: General;  Laterality: Right;  Right forearm I&D poss. wound closure    PANCREATECTOMY  01/2011    partial    THORACENTESIS Right 2012    pl effusion       Family History   Adopted: Yes       Social  Social History     Tobacco Use    Smoking status: Current Every Day Smoker     Packs/day:  0.50     Years: 30.00     Pack years: 15.00     Types: Cigarettes    Smokeless tobacco: Never Used    Tobacco comment: 1 pack every 3 days   Substance Use Topics    Alcohol use: Yes     Comment: 4-5 mixed drinks per day    Drug use: No       .     Allergies   Allergen Reactions    Morphine Hallucinations       Home Medications     Med List Status:  Complete Set By: Wyvonna Plum, RN at 11/28/2018  8:55 AM                albuterol (PROVENTIL HFA;VENTOLIN HFA) 108 (90 Base) MCG/ACT inhaler     Inhale 2 puffs into the lungs every 6 (six) hours as needed for Wheezing     amLODIPine (NORVASC) 5 MG tablet     Take 1 tablet (5 mg total) by mouth daily     furosemide (LASIX)  40 MG tablet     Take 40 mg by mouth daily as needed (swelling in feet or weight gain)        Multiple Vitamins-Minerals (MULTIVITAMIN WITH MINERALS) tablet     Take 1 tablet by mouth every morning.        naloxone (NARCAN) 4 MG/0.1ML nasal spray     1 spray intranasally. If pt does not respond or relapses into respiratory depression call 911. Give additional doses every 2-3 min.     ondansetron (ZOFRAN-ODT) 4 MG disintegrating tablet     Take 1 tablet (4 mg total) by mouth every 12 (twelve) hours as needed for Nausea     pantoprazole (Protonix) 40 MG tablet     Take 40 mg by mouth daily     potassium chloride (KLOR-CON) 20 MEQ tablet     Take 20 mEq by mouth 2 (two) times daily (Take one tablet by mouth three times a day only on days you take a Lasix)       spironolactone (ALDACTONE) 50 MG tablet     Take 1 tablet (50 mg total) by mouth daily           Review of Systems   Constitutional: Negative.    HENT: Negative.    Eyes: Negative.    Respiratory: Negative.    Cardiovascular: Negative.    Gastrointestinal: Positive for abdominal pain, nausea and vomiting.   Genitourinary: Negative.    Musculoskeletal: Negative.    Skin: Negative.    Neurological: Positive for dizziness, weakness, light-headedness and headaches.   Psychiatric/Behavioral:  Negative.        Physical Exam    BP: 128/78, Heart Rate: 100, Temp: 98.1 F (36.7 C), Resp Rate: 19, SpO2: 99 %, Weight: 69 kg     Physical Exam  Vitals signs and nursing note reviewed.   Constitutional:       Appearance: Normal appearance. She is well-developed.   HENT:      Head: Normocephalic.      Right Ear: Hearing, tympanic membrane, ear canal and external ear normal.      Left Ear: Hearing, tympanic membrane, ear canal and external ear normal.      Nose: Nose normal.      Mouth/Throat:      Pharynx: Uvula midline.   Eyes:      General: Lids are normal.      Conjunctiva/sclera: Conjunctivae normal.      Pupils: Pupils are equal, round, and reactive to light.      Comments: She has ecchymosis and bruising around both eyes.   Neck:      Musculoskeletal: Full passive range of motion without pain, normal range of motion and neck supple.      Trachea: Trachea and phonation normal.   Cardiovascular:      Rate and Rhythm: Normal rate and regular rhythm.      Heart sounds: Normal heart sounds, S1 normal and S2 normal.   Pulmonary:      Effort: Pulmonary effort is normal.      Breath sounds: Normal breath sounds.   Abdominal:      General: Bowel sounds are normal.      Palpations: Abdomen is soft.   Musculoskeletal: Normal range of motion.   Skin:     General: Skin is warm and dry.   Neurological:      Mental Status: She is alert and oriented to person, place, and time.   Psychiatric:  Behavior: Behavior normal. Behavior is cooperative.           MDM and ED Course     ED Medication Orders (From admission, onward)    Start Ordered     Status Ordering Provider    11/28/18 0900 11/28/18 0009  folic acid (FOLVITE) tablet 1 mg  Daily     Route: Oral  Ordered Dose: 1 mg     Last MAR action:  Given KHARSA, LAURA A    11/28/18 0900 11/28/18 0009  vitamin B-1 (THIAMINE) tablet 100 mg  Daily     Route: Oral  Ordered Dose: 100 mg     Last MAR action:  Given KHARSA, LAURA A    11/28/18 0900 11/28/18 0009  nicotine  (NICODERM CQ) 21 MG/24HR patch 1 patch  Daily     Route: Transdermal  Ordered Dose: 1 patch     Last MAR action:  Patch Applied KHARSA, LAURA A    11/28/18 0829 11/28/18 0829  HYDROmorphone (DILAUDID) injection 0.5 mg  Every 4 hours PRN     Route: Intravenous  Ordered Dose: 0.5 mg     Last MAR action:  Given ALTAY, AHMET    11/28/18 0800 11/28/18 0009  multivitamin (CEROVITE) tablet 1 tablet  Every morning     Route: Oral  Ordered Dose: 1 tablet     Last MAR action:  Given KHARSA, LAURA A    11/28/18 0600 11/28/18 0009  PHENobarbital (LUMINAL) tablet 64.8 mg  Every 8 hours     Route: Oral  Ordered Dose: 64.8 mg     Last MAR action:  Not Given Yolanda Manges A    11/28/18 0020 11/28/18 0020    Every 6 hours PRN     Route: Intravenous  Ordered Dose: 50 mcg     Discontinued KHARSA, LAURA A    11/28/18 0009 11/28/18 0009  sodium chloride (PF) 0.9 % injection 3 mL  Every 8 hours     Route: Intravenous  Ordered Dose: 3 mL     Last MAR action:  Given KHARSA, LAURA A    11/28/18 0009 11/28/18 0009  sodium chloride (PF) 0.9 % injection 3 mL  Every 8 hours     Route: Intravenous  Ordered Dose: 3 mL     Last MAR action:  Given KHARSA, LAURA A    11/28/18 0009 11/28/18 0009  naloxone (NARCAN) injection 0.4 mg  As needed     Route: Intravenous  Ordered Dose: 0.4 mg     Acknowledged KHARSA, LAURA A    11/28/18 0009 11/28/18 0009  diazePAM (VALIUM) tablet 10 mg  Every 1 hour PRN     Route: Oral  Ordered Dose: 10 mg     Acknowledged KHARSA, LAURA A    11/28/18 0009 11/28/18 0009  diazePAM (VALIUM) tablet 5 mg  Every 2 hours PRN     Route: Oral  Ordered Dose: 5 mg     Acknowledged KHARSA, LAURA A    11/28/18 0009 11/28/18 0009  ondansetron (ZOFRAN-ODT) disintegrating tablet 4 mg  Every 8 hours PRN     Route: Oral  Ordered Dose: 4 mg     Last MAR action:  See Alternative KHARSA, LAURA A    11/28/18 0009 11/28/18 0009  ondansetron (ZOFRAN) injection 4 mg  Every 4 hours PRN     Route: Intravenous  Ordered Dose: 4 mg     Last MAR action:   Given KHARSA, LAURA A    11/28/18  0009 11/28/18 0009  prochlorperazine (COMPAZINE) injection 5 mg  Every 4 hours PRN     Route: Intravenous  Ordered Dose: 5 mg     Acknowledged KHARSA, LAURA A    11/28/18 0009 11/28/18 0009  acetaminophen (TYLENOL) tablet 650 mg  Every 4 hours PRN     Route: Oral  Ordered Dose: 650 mg     Acknowledged KHARSA, LAURA A    11/28/18 0009 11/28/18 0009  acetaminophen (TYLENOL) 160 MG/5ML oral solution 650 mg  Every 4 hours PRN     Route: per NG tube  Ordered Dose: 650 mg     Acknowledged KHARSA, LAURA A    11/28/18 0009 11/28/18 0009  acetaminophen (TYLENOL) suppository 650 mg  Every 4 hours PRN     Route: Rectal  Ordered Dose: 650 mg     Acknowledged KHARSA, LAURA A    11/27/18 2346 11/27/18 2345    Continuous     Route: Intravenous     Discontinued ALTAY, AHMET    11/27/18 2346 11/27/18 2345    Continuous     Route: Intravenous     Discontinued KHARSA, LAURA A    11/27/18 2158 11/27/18 2157  PHENobarbital injection - MAINTENANCE dose 260 mg  Once     Route: Intravenous  Ordered Dose: 260 mg     Last MAR action:  Given Kemper Durie WADE    11/27/18 2043 11/27/18 2042  sodium chloride 0.9 % bolus 1,000 mL  Once in ED     Route: Intravenous  Ordered Dose: 1,000 mL     Last MAR action:  Stopped Kemper Durie WADE    11/27/18 2043 11/27/18 2042  prochlorperazine (COMPAZINE) injection 10 mg  Once in ED     Route: Intravenous  Ordered Dose: 10 mg     Last MAR action:  Given Gracelin Weisberg WADE             MDM  Number of Diagnoses or Management Options     Amount and/or Complexity of Data Reviewed  Clinical lab tests: reviewed  Tests in the radiology section of CPT: reviewed  Tests in the medicine section of CPT: reviewed  Discussion of test results with the performing providers: yes  Decide to obtain previous medical records or to obtain history from someone other than the patient: yes  Obtain history from someone other than the patient: yes  Review and summarize past  medical records: yes  Discuss the patient with other providers: yes  Independent visualization of images, tracings, or specimens: yes    Risk of Complications, Morbidity, and/or Mortality  Presenting problems: high  Diagnostic procedures: high  Management options: high    Patient Progress  Patient progress: stable                   Procedures    Clinical Impression & Disposition     Clinical Impression  Final diagnoses:   Alcohol-induced chronic pancreatitis        ED Disposition     ED Disposition Condition Date/Time Comment    Admit  Wed Nov 27, 2018  9:59 PM            Current Discharge Medication List                    Dedra Skeens, MD  11/30/18 1736

## 2018-11-27 NOTE — ED Triage Notes (Signed)
Pt presents with c/c of syncope/weakness, denies LOC but states over the last 4-5 days she has been progressively weaker with frequent falls. Pt has scattered bruising throughout, significant bruising to her face, relating it to falls. +N/V/D. Pt states she drinks 4-5 mixed drinks per day, has not taken her daily medications. Pt states whenever she takes anything in (including her medications) she vomits them back up. Hx pancreatitis, denies other history, has scheduled appt with GI in September. Pt also had low sodium last visit, suppose to take sodium bicard TID however has not taken it in a few days.

## 2018-11-27 NOTE — H&P (Signed)
Medicine History & Physical   [facility]  [employer]   Patient Name: [name],[name]: 0 days   Attending Physician: Dedra Skeens PCP: Pcp, None, MD      Assessment and Plan:                                                                Chronic active alcoholism complicated by;   - Acute on chronic alcoholic pancreatitis with alcoholic gastritis   - Elevated anion gap metabolic acidosis  CIWA protocol for alcohol withdrawal initiated  Phenobarbital with symptom triggered oral diazepam  NPO except ice chips and relevant medications  Aggressive IVF, antiemetic and analgesic therapy  Check lactic acid, serum and urine ketones  Obtain CT imaging of the abdomen and pelvis  Thiamine, folic acid, multivitamin and magnesium supplementation  Fall, aspiration and seizure precautions  Extensive counseling done on alcohol abuse cessation with patient asserting understanding  Telemetry monitoring  SBIRT consult prior to discharge    Hyponatremia with hypochloremia and hypo-osmolality  Obtain urine studies  Serum chloride repletion  Repeat metabolic panel ordered    Essential HTN  Low normal on admission  Will hold amlodipine and Lasix and Aldactone on admission; reevaluate and resume when appropriate  Continue to monitor blood pressure    Chronic tobacco use disorder   Nicotine placement therapy ordered  Tobacco abuse cessation counseling done  SBIRT consult prior to discharge      DVT PPx: Mechanical   Dispo: Inpatient  Healthcare Proxy: Friend  Code: do not resuscitate     History of Presenting Illness                                CC: Fall, abdominal pain, nausea and vomiting  [name] is a 58 y.o. Caucasian female patient with medical history of chronic active alcoholism complicated by chronic pancreatitis, essential hypertension, chronic tobacco use disorder, presents to [facility] emergency room with above chief complaint  Patient presents with a  fall; patient denies being drunk at the time but according to her roommate was present, she was drunk.  Patient consumes 5 large glasses of vodka daily.  Recently discharged from this facility in the early part of July 2020 after being managed for acute on chronic alcohol induced pancreatitis.  In addition to the fall, patient admits to generalized weakness with nausea and vomiting.  She also has epigastric abdominal pain radiating straight to her back.  Has not been able to take care of her own medications because of that.  Lipase elevated at 188 but actually trending down from a recent high of 1771  Sodium low at 125 with hypochloremia and hypo-osmolality; was low on recent admission and was discharged with sodium tablets which she is not taking because of her nausea and vomiting.  Severely acidotic with CO2 of 9.2 and anion gap of 31.2  Normal troponin of 0.02  Received 1 L normal saline infusion, antiemetic and phenobarb loading dose in the ED  Hospitalist service consulted for admission and patient was seen at bedside in the ED with her roommate present  Will admit for further evaluation and management    Past Medical History:  Diagnosis Date    Ascites     Asthma without status asthmaticus     Clostridium difficile colitis     Disorder of liver     multifocal cystic irregularities    DVT (deep venous thrombosis)     Gastroesophageal reflux disease     H/O ETOH abuse     sober 2 years until 03/2014    Hypertension     Hypokalemia     Pancreatitis     S/P IVC filter        Past Surgical History:   Procedure Laterality Date    CHOLECYSTECTOMY      COLONOSCOPY  12/26/2012    Procedure: COLONOSCOPY;  Surgeon: Gwenith Spitz, MD;  Location: [facility];  Service: Gastroenterology;  Laterality: N/A;    COLONOSCOPY, POLYPECTOMY  12/26/2012    Procedure: COLONOSCOPY, POLYPECTOMY;  Surgeon: Gwenith Spitz, MD;  Location: [facility];  Service: Gastroenterology;  Laterality: N/A;    DEBRIDEMENT  & IRRIGATION, WOUND CLOSURE Right 08/29/2018    Procedure: WASHOUT AND CLOSURE OF TRAUMATIC WOUND, RIGHT FOREARM;  Surgeon: Arnoldo Hooker, MD;  Location: [facility];  Service: General;  Laterality: Right;  Right forearm I&D poss. wound closure    PANCREATECTOMY  01/2011    partial    THORACENTESIS Right 2012    pl effusion       Family History   Adopted: Yes   Reviewed and unknown as patient is adopted    Social History     Tobacco Use    Smoking status: Current Every Day Smoker     Packs/day: 0.50     Years: 30.00     Pack years: 15.00     Types: Cigarettes    Smokeless tobacco: Never Used    Tobacco comment: 1 pack every 3 days   Substance Use Topics    Alcohol use: Yes     Comment: 4-5 mixed drinks per day    Drug use: No       Subjective   Review of Systems:  All systems were reviewed and are negative unless pertinent positive stated in HPI.  CONSTITUTIONAL: No night sweats. No fatigue. No fever or chills.   Eyes: No visual changes. No eye pain.   ENT: No runny nose. No epistaxis.   RESPIRATORY: No cough. No hemoptysis. No shortness of breath.   CARDIOVASCULAR: No chest pains. No palpitations.   GASTROINTESTINAL: No abdominal pain. No vomiting. No diarrhea or constipation.   GENITOURINARY: No urgency. No frequency. No dysuria.   MUSCULOSKELETAL: No musculoskeletal pain. No joint swelling.   NEUROLOGICAL: No headache or neck pain. No syncope or seizure.   PSYCHIATRIC: No depression, no psychosis.  SKIN: No rashes. No lesions. No petechiae.  ENDOCRINE: No unexplained weight loss. No polydipsia.   HEMATOLOGIC: No anemia. No purpura. No bleeding.   ALLERGIC AND IMMUNOLOGIC: No pruritus. No swelling.         Objective   Physical Exam:     Vitals: T:98.1 F (36.7 C) (Temporal),  BP:(!) 138/91, HR:93, RR:17, SaO2:97%    1) General Appearance: Alert and oriented x 4. In no acute distress.   2) Eyes: Pink conjunctiva, anicteric sclera. Pupils are equally reactive to light.  3) ENT: Oral mucosa moist with no  pharyngeal congestion, erythema or swelling.  4) Neck: Supple, with full range of motion. Trachea is central, no JVD noted  5) Chest: Clear to auscultation bilaterally, no wheezes or rhonchi.  6) CVS: Normal rate and  regular rhythm, with no murmurs.  7) Abdomen: Soft, non-tender, no palpable mass. Bowel sounds normal. No CVA tenderness  8) Extremities: No pitting edema, pulses palpable, no calf swelling and no gross deformity.  9) Skin: Periorbital ecchymosis  10) Lymphatics: No lymphadenopathy in axillary, cervical and inguinal area.   11) Neurological: Cranial nerves II-XII intact. No gross focal motor or sensory deficits noted.  12) Psychiatric: Affect is appropriate. No hallucinations.    EKG: Per my review shows   Sinus rhythm @ 90  Abnormal R-wave progression, early transition   Borderline prolonged QT interval   Compared to ECG 04/17/2015 13:50:43   No significant changes  Chest Xray: Per my review shows   Technique:  PA and lateral radiographs of the chest were obtained.  Findings:    Stable cardiac contour and mediastinal silhouette.  No focal consolidation, pleural effusion, or pneumothorax.  No destructive osseous lesions.  IMPRESSION:   No acute cardiopulmonary process.    Patient Vitals for the past 12 hrs:   BP Temp Pulse Resp   11/27/18 2217 (!) 138/91  93 17   11/27/18 2146 122/81  79 15   11/27/18 2031 128/79  88 13   11/27/18 2029 118/80  88 13   11/27/18 1921 135/90  93 14   11/27/18 1846 128/78 98.1 F (36.7 C) 100 19     Weight Monitoring 10/15/2018 10/16/2018 10/22/2018 10/23/2018 11/12/2018 11/19/2018 11/27/2018   Height - - 167.6 cm 167.6 cm 167.6 cm 167.6 cm -   Height Method - - - - - - -   Weight 74.8 kg 78.5 kg 69.4 kg 69.4 kg 68.584 kg 68.493 kg 69 kg   Weight Method Standing Scale Standing Scale - - - - -   BMI (calculated) - - 24.7 kg/m2 24.7 kg/m2 24.5 kg/m2 24.4 kg/m2 -          Recent Results (from the past 24 hour(s))   ECG 12 lead    Collection Time: 11/27/18  6:55 PM   Result Value  Ref Range    Patient Age 31 years    Patient DOB [birth year]     Patient Height      Patient Weight      Interpretation Text       Sinus rhythm  Abnormal R-wave progression, early transition  Borderline prolonged QT interval  Compared to ECG 04/17/2015 13:50:43  No significant changes      Physician Interpreter      Ventricular Rate 90 //min    QRS Duration 94 ms    P-R Interval 190 ms    Q-T Interval 398 ms    Q-T Interval(Corrected) 487 ms    P Wave Axis 78 deg    QRS Axis 49 deg    T Axis 64 years   CBC and differential    Collection Time: 11/27/18  7:05 PM   Result Value Ref Range    WBC 6.6 4.0 - 11.0 K/cmm    RBC 3.44 (L) 3.80 - 5.00 M/cmm    Hemoglobin 12.0 12.0 - 16.0 gm/dL    Hematocrit 16.1 (L) 36.0 - 48.0 %    MCV 103 (H) 80 - 100 fL    MCH 35 28 - 35 pg    MCHC 34 32 - 36 gm/dL    RDW 09.6 (H) 04.5 - 14.0 %    PLT CT 177 130 - 440 K/cmm    MPV 6.7 6.0 - 10.0 fL    Neutrophils %  71.5 42.0 - 78.0 %    Lymphocytes 20.6 15.0 - 46.0 %    Monocytes 6.2 3.0 - 15.0 %    Eosinophils % 0.4 0.0 - 7.0 %    Basophils % 1.3 0.0 - 3.0 %    Neutrophils Absolute 4.7 1.7 - 8.6 K/cmm    Lymphocytes Absolute 1.4 0.6 - 5.1 K/cmm    Monocytes Absolute 0.4 0.1 - 1.7 K/cmm    Eosinophils Absolute 0.0 0.0 - 0.8 K/cmm    Basophils Absolute 0.1 0.0 - 0.3 K/cmm   Comprehensive metabolic panel    Collection Time: 11/27/18  7:05 PM   Result Value Ref Range    Sodium 125 (LL) 136 - 147 mMol/L    Potassium 4.4 3.5 - 5.3 mMol/L    Chloride 89 (L) 98 - 110 mMol/L    CO2 9.2 (LL) 20.0 - 30.0 mMol/L    Calcium 9.8 8.5 - 10.5 mg/dL    Glucose 71 71 - 99 mg/dL    Creatinine 1.61 0.96 - 1.20 mg/dL    BUN 16 7 - 22 mg/dL    Protein, Total 8.7 (H) 6.0 - 8.3 gm/dL    Albumin 4.5 3.5 - 5.0 gm/dL    Alkaline Phosphatase 285 (H) 40 - 145 U/L    ALT 39 0 - 55 U/L    AST (SGOT) 87 (H) 10 - 42 U/L    Bilirubin, Total 2.3 (H) 0.1 - 1.2 mg/dL    Albumin/Globulin Ratio 1.07 0.80 - 2.00 Ratio    BUN / Creatinine Ratio 13.3 10.0 - 30.0 Ratio    Anion Gap  31.2 (H) 7.0 - 18.0 mMol/L    EGFR 50 (L) 60 - 150 mL/min/1.86m2    Globulin 4.2 (H) 2.0 - 4.0 gm/dL    Osmolality Calculated 251 (L) 275 - 300 mOsm/kg   Troponin I    Collection Time: 11/27/18  7:05 PM   Result Value Ref Range    Troponin I 0.02 0.00 - 0.02 ng/mL   B-type Natriuretic Peptide    Collection Time: 11/27/18  7:05 PM   Result Value Ref Range    B-Natriuretic Peptide 39.0 0.0 - 100.0 pg/mL   Hepatic function panel (LFT)    Collection Time: 11/27/18  7:05 PM   Result Value Ref Range    Protein, Total 8.8 (H) 6.0 - 8.3 gm/dL    Albumin 4.6 3.5 - 5.0 gm/dL    Alkaline Phosphatase 279 (H) 40 - 145 U/L    ALT 38 0 - 55 U/L    AST (SGOT) 88 (H) 10 - 42 U/L    Bilirubin, Total 2.3 (H) 0.1 - 1.2 mg/dL    Bilirubin Direct 1.2 (H) 0.0 - 0.3 mg/dL    Albumin/Globulin Ratio 1.10 0.80 - 2.00 Ratio    Globulin 4.2 (H) 2.0 - 4.0 gm/dL   Lipase    Collection Time: 11/27/18  7:05 PM   Result Value Ref Range    Lipase 188 (H) 8 - 78 U/L        Allergies   Allergen Reactions    Morphine Hallucinations      Xr Chest 2 Views    Result Date: 11/27/2018  No acute cardiopulmonary process. ReadingStation:[email or web address]    Ct Head Wo- (rad Read)    Result Date: 11/27/2018  No acute intracranial findings. No acute skull fracture. Left parietal scalp hematoma. 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 ReadingStation:[id number]     Home Medications  Med List Status:  In Progress Set By: Elpidio Eric, RN at 11/27/2018  7:22 PM                albuterol (PROVENTIL HFA;VENTOLIN HFA) 108 (90 Base) MCG/ACT inhaler     Inhale 2 puffs into the lungs every 6 (six) hours as needed for Wheezing     amLODIPine (NORVASC) 5 MG tablet     Take 1 tablet (5 mg total) by mouth daily     furosemide (LASIX) 40 MG tablet     Take 40 mg by mouth daily as needed (swelling in feet or weight gain)        Multiple Vitamins-Minerals (MULTIVITAMIN WITH MINERALS) tablet     Take 1 tablet by mouth every morning.        naloxone  (NARCAN) 4 MG/0.1ML nasal spray     1 spray intranasally. If pt does not respond or relapses into respiratory depression call 911. Give additional doses every 2-3 min.     ondansetron (ZOFRAN-ODT) 4 MG disintegrating tablet     Take 1 tablet (4 mg total) by mouth every 12 (twelve) hours as needed for Nausea     pantoprazole (Protonix) 40 MG tablet     Take 40 mg by mouth daily     potassium chloride (KLOR-CON) 20 MEQ tablet     Take 20 mEq by mouth 2 (two) times daily (Take one tablet by mouth three times a day only on days you take a Lasix)       spironolactone (ALDACTONE) 50 MG tablet     Take 1 tablet (50 mg total) by mouth daily         Meds given in the ED:  Medications   PHENobarbital injection - MAINTENANCE dose 260 mg (has no administration in time range)   sodium chloride 0.9 % bolus 1,000 mL (1,000 mLs Intravenous New Bag 11/27/18 2055)   prochlorperazine (COMPAZINE) injection 10 mg (10 mg Intravenous Given 11/27/18 2055)      Time Spent:     Ester Rink, MD     11/27/18,10:39 PM   MRN: [id number]                                      CSN: [id number] DOB: 14-Feb-1961

## 2018-11-28 ENCOUNTER — Inpatient Hospital Stay: Payer: Medicaid Other

## 2018-11-28 LAB — BASIC METABOLIC PANEL
Anion Gap: 20.2 mMol/L — ABNORMAL HIGH (ref 7.0–18.0)
BUN / Creatinine Ratio: 16.3 Ratio (ref 10.0–30.0)
BUN: 16 mg/dL (ref 7–22)
CO2: 14 mMol/L — CL (ref 20–30)
Calcium: 8.7 mg/dL (ref 8.5–10.5)
Chloride: 97 mMol/L — ABNORMAL LOW (ref 98–110)
Creatinine: 0.98 mg/dL (ref 0.60–1.20)
EGFR: 64 mL/min/{1.73_m2} (ref 60–150)
Glucose: 68 mg/dL — ABNORMAL LOW (ref 71–99)
Osmolality Calculated: 255 mOsm/kg — ABNORMAL LOW (ref 275–300)
Potassium: 4.2 mMol/L (ref 3.5–5.3)
Sodium: 127 mMol/L — ABNORMAL LOW (ref 136–147)

## 2018-11-28 LAB — CBC AND DIFFERENTIAL
Basophils %: 1.2 % (ref 0.0–3.0)
Basophils Absolute: 0.1 10*3/uL (ref 0.0–0.3)
Eosinophils %: 1.4 % (ref 0.0–7.0)
Eosinophils Absolute: 0.1 10*3/uL (ref 0.0–0.8)
Hematocrit: 27.3 % — ABNORMAL LOW (ref 36.0–48.0)
Hemoglobin: 9.4 gm/dL — ABNORMAL LOW (ref 12.0–16.0)
Lymphocytes Absolute: 2 10*3/uL (ref 0.6–5.1)
Lymphocytes: 32.4 % (ref 15.0–46.0)
MCH: 36 pg — ABNORMAL HIGH (ref 28–35)
MCHC: 35 gm/dL (ref 32–36)
MCV: 104 fL — ABNORMAL HIGH (ref 80–100)
MPV: 6.9 fL (ref 6.0–10.0)
Monocytes Absolute: 0.7 10*3/uL (ref 0.1–1.7)
Monocytes: 11.1 % (ref 3.0–15.0)
Neutrophils %: 54 % (ref 42.0–78.0)
Neutrophils Absolute: 3.3 10*3/uL (ref 1.7–8.6)
PLT CT: 202 10*3/uL (ref 130–440)
RBC: 2.63 10*6/uL — ABNORMAL LOW (ref 3.80–5.00)
RDW: 16.6 % — ABNORMAL HIGH (ref 11.0–14.0)
WBC: 6 10*3/uL (ref 4.0–11.0)

## 2018-11-28 LAB — OSMOLALITY, URINE: Urine Osmolality: 421 mOsm/kg (ref 301–1093)

## 2018-11-28 LAB — LACTIC ACID, PLASMA: Lactic Acid: 0.6 mMol/L (ref 0.5–1.9)

## 2018-11-28 LAB — MAGNESIUM: Magnesium: 1.5 mg/dL — ABNORMAL LOW (ref 1.6–2.6)

## 2018-11-28 LAB — SODIUM, URINE, RANDOM: Urine Sodium Random: 45 mMol/L

## 2018-11-28 MED ORDER — NALOXONE HCL 0.4 MG/ML IJ SOLN (WRAP)
0.40 mg | INTRAMUSCULAR | Status: DC | PRN
Start: 2018-11-28 — End: 2018-12-01

## 2018-11-28 MED ORDER — PHENOBARBITAL 32.4 MG PO TABS
ORAL_TABLET | ORAL | Status: AC
Start: 2018-11-28 — End: ?
  Filled 2018-11-28: qty 2

## 2018-11-28 MED ORDER — ONDANSETRON 4 MG PO TBDP
4.00 mg | ORAL_TABLET | Freq: Three times a day (TID) | ORAL | Status: DC | PRN
Start: 2018-11-28 — End: 2018-12-01

## 2018-11-28 MED ORDER — HYDROMORPHONE HCL 0.5 MG/0.5 ML IJ SOLN
0.50 mg | INTRAMUSCULAR | Status: DC | PRN
Start: 2018-11-28 — End: 2018-12-01
  Administered 2018-11-28 – 2018-12-01 (×16): 0.5 mg via INTRAVENOUS
  Filled 2018-11-28 (×14): qty 0.5

## 2018-11-28 MED ORDER — NICOTINE 21 MG/24HR TD PT24
1.00 | MEDICATED_PATCH | Freq: Every day | TRANSDERMAL | Status: DC
Start: 2018-11-28 — End: 2018-12-01
  Administered 2018-11-28 – 2018-12-01 (×4): 1 via TRANSDERMAL
  Filled 2018-11-28 (×4): qty 1

## 2018-11-28 MED ORDER — HYDROMORPHONE HCL 0.5 MG/0.5 ML IJ SOLN
INTRAMUSCULAR | Status: AC
Start: 2018-11-28 — End: ?
  Filled 2018-11-28: qty 0.5

## 2018-11-28 MED ORDER — SODIUM CHLORIDE (PF) 0.9 % IJ SOLN
3.00 mL | Freq: Three times a day (TID) | INTRAMUSCULAR | Status: DC
Start: 2018-11-28 — End: 2018-12-01
  Administered 2018-11-28 – 2018-11-30 (×6): 3 mL via INTRAVENOUS

## 2018-11-28 MED ORDER — PHENOBARBITAL 32.4 MG PO TABS
64.8000 mg | ORAL_TABLET | Freq: Three times a day (TID) | ORAL | Status: AC
Start: 2018-11-28 — End: 2018-11-30
  Administered 2018-11-28 – 2018-11-29 (×5): 64.8 mg via ORAL
  Filled 2018-11-28 (×3): qty 2

## 2018-11-28 MED ORDER — FENTANYL CITRATE (PF) 50 MCG/ML IJ SOLN (WRAP)
50.00 ug | Freq: Four times a day (QID) | INTRAMUSCULAR | Status: DC | PRN
Start: 2018-11-28 — End: 2018-11-28
  Administered 2018-11-28: 50 ug via INTRAVENOUS

## 2018-11-28 MED ORDER — ACETAMINOPHEN 650 MG RE SUPP
650.00 mg | RECTAL | Status: DC | PRN
Start: 2018-11-28 — End: 2018-12-01

## 2018-11-28 MED ORDER — ACETAMINOPHEN 325 MG PO TABS
650.0000 mg | ORAL_TABLET | ORAL | Status: DC | PRN
Start: 2018-11-28 — End: 2018-12-01

## 2018-11-28 MED ORDER — PROCHLORPERAZINE EDISYLATE 10 MG/2ML IJ SOLN
5.00 mg | INTRAMUSCULAR | Status: DC | PRN
Start: 2018-11-28 — End: 2018-12-01

## 2018-11-28 MED ORDER — ONDANSETRON HCL 4 MG/2ML IJ SOLN
INTRAMUSCULAR | Status: AC
Start: 2018-11-28 — End: ?
  Filled 2018-11-28: qty 2

## 2018-11-28 MED ORDER — ACETAMINOPHEN 160 MG/5ML PO SOLN
650.00 mg | ORAL | Status: DC | PRN
Start: 2018-11-28 — End: 2018-12-01

## 2018-11-28 MED ORDER — SODIUM CHLORIDE (PF) 0.9 % IJ SOLN
3.00 mL | Freq: Three times a day (TID) | INTRAMUSCULAR | Status: DC
Start: 2018-11-28 — End: 2018-12-01
  Administered 2018-11-28 – 2018-12-01 (×7): 3 mL via INTRAVENOUS

## 2018-11-28 MED ORDER — VH VITAMIN B-1 100 MG PO TABS (WRAP)
100.0000 mg | ORAL_TABLET | Freq: Every day | ORAL | Status: DC
Start: 2018-11-28 — End: 2018-12-01
  Administered 2018-11-28 – 2018-12-01 (×4): 100 mg via ORAL
  Filled 2018-11-28 (×4): qty 1

## 2018-11-28 MED ORDER — FOLIC ACID 1 MG PO TABS
1.0000 mg | ORAL_TABLET | Freq: Every day | ORAL | Status: DC
Start: 2018-11-28 — End: 2018-12-01
  Administered 2018-11-28 – 2018-12-01 (×4): 1 mg via ORAL
  Filled 2018-11-28 (×4): qty 1

## 2018-11-28 MED ORDER — DIAZEPAM 5 MG PO TABS
10.0000 mg | ORAL_TABLET | ORAL | Status: DC | PRN
Start: 2018-11-28 — End: 2018-12-01

## 2018-11-28 MED ORDER — ONDANSETRON HCL 4 MG/2ML IJ SOLN
4.00 mg | INTRAMUSCULAR | Status: DC | PRN
Start: 2018-11-28 — End: 2018-12-01
  Administered 2018-11-28: 02:00:00 4 mg via INTRAVENOUS

## 2018-11-28 MED ORDER — CEROVITE ADVANCED FORMULA PO TABS
1.0000 | ORAL_TABLET | Freq: Every morning | ORAL | Status: DC
Start: 2018-11-28 — End: 2018-12-01
  Administered 2018-11-28 – 2018-12-01 (×4): 1 via ORAL
  Filled 2018-11-28 (×4): qty 1

## 2018-11-28 MED ORDER — DIAZEPAM 5 MG PO TABS
5.0000 mg | ORAL_TABLET | ORAL | Status: DC | PRN
Start: 2018-11-28 — End: 2018-12-01

## 2018-11-28 MED ORDER — FENTANYL CITRATE (PF) 50 MCG/ML IJ SOLN (WRAP)
INTRAMUSCULAR | Status: AC
Start: 2018-11-28 — End: ?
  Filled 2018-11-28: qty 2

## 2018-11-28 NOTE — Nursing Progress Note (Signed)
NURSE NOTE SUMMARY  [facility] EMERGENCY DEPARTMENT   Patient Name: [name]   Attending Physician: Ester Rink, MD   Today's date:   11/28/2018 LOS: 1 days   Shift Summary:                                                              0150: Pt arrived to CDU with c/o 10/10 abd pain and nausea. Fentanyl and Zofran given per order. IVF's infusing at this time.    Provider Notifications:      Rapid Response Notifications:  Mobility:          Weight tracking:  Family Dynamic:   Last 3 Weights for the past 72 hrs (Last 3 readings):   Weight   11/27/18 1846 69 kg (152 lb 1.9 oz)             Recent Vitals Last Bowel Movement   BP: 115/82 (11/28/2018 12:29 AM)  Heart Rate: 91 (11/28/2018 12:29 AM)  Temp: 99.1 F (37.3 C) (11/28/2018 12:29 AM)  Resp Rate: 16 (11/28/2018 12:29 AM)  Weight: 69 kg (152 lb 1.9 oz) (11/27/2018  6:46 PM)  SpO2: 95 % (11/28/2018 12:29 AM)   No data recorded   \

## 2018-11-28 NOTE — Progress Notes (Signed)
ED Case Management Note:     Readmission Risk  [facility] EMERGENCY DEPARTMENT   Patient Name: [name]   Attending Physician: Clabe Seal, MD   Today's date:   11/28/2018 LOS: 1 days   Expected Discharge Date      Readmission Assessment:                                                              Discharge Planning  ReAdmit Risk Score: 17  Does the patient have perscription coverage?: Yes  Confirmed PCP with Pt: Yes  Confirmed PCP name: Agyeiwaah  Last PCP Visit Date: First appointment will be 8/26  Confirm Transport to F/U Appt.: [employer]  DME anticipated at discharge: Four wheel walker  Anticipated Home Health at [place]: No  Anticipated Placement at [place]: No  CM Comments: 11/28/18 RNCM LB: Fall, syncope, N/V, hx alchoholism ascites, pancreatitits.  For CIWA, IVF, CT abd, SBIRT consult. Pnt lives with roommate Lyda Jester, no assistive device, frquent falls with bruising.  Drives some, Roommate drives if needed, has Medicaid but no card yet--provided her with Medicaid number and phone number for transport, she entered it in her phone. Has PCP first visit for 8/26-Agyeiwaah, sees GI in [place]. Has not followed up on Sub abuse resources given last visit because she has a [employer] fast streamer thru previous employer that lasts thru 02/2019 and she thinks they will not pay if she gets any substance abuse tx. BUT she is not recievng the money now as she needs paperwork completed, she plans to see her old PCP in [place] for that. States has house full of food, roommate paying rent. States roommate Lyda Jester is also alcoholic.  May need PT eval for possible cane/walker/rollator. Chaplain will meet with pnt to complete AD.       IDPA:      Healthcare Decisions  Interviewed:: Patient  Orientation/Decision Making Abilities of Patient: Alert and Oriented x3, able to make decisions  Advance Directive: Patient does not have advance directive  Healthcare Agent Appointed: No  Prior to admission  Prior level of  function: Ambulates independently, Independent with ADLs  Type of Residence: Private residence  Home Layout: Two level(5 STE, 18 to bedroom)  Have running water, electricity, heat, etc?: Yes  Living Arrangements: Friends(Roommate Guerry Minors)  How do you get to your MD appointments?: self or roommate  How do you get your groceries?: self or roommate  Who fixes your meals?: self  Who does your laundry?: self  Who picks up your prescriptions?: Roommate  Dressing: Independent  Grooming: Independent  Feeding: Independent  Bathing: Independent  Toileting: Independent  Prior Rehab admission? (Detail): In [place] 2.5 yrs ago  Adult Management consultant (APS) involved?: No  Discharge Planning  Support Systems: Friends/neighbors  Patient expects to be discharged to:: home  Anticipated [place] plan discussed with:: Same as interviewed  Mode of transportation:: Private car (family member)(Roommate will drive her home)  Does the patient have perscription coverage?: Yes  Consults/Providers  PT Evaluation Needed: Yes (Comment)  OT Evalulation Needed: No  SLP Evaluation Needed: No      30 Day Readmission:       Provider Notifications:          Wilford Grist, RN, BSN, ACM  Case  Management Emergency Department  [phone]  Office Hours: Monday - Friday 10 a.m. to 6 p.m.

## 2018-11-28 NOTE — Progress Notes (Signed)
Assisted patient with AD education. Patient wishes to complete 5 wishes at home. Left copy with patient.     11/28/18 1600   Visit Type   Visit Type Initial   Visit Source   Visit Source Staff Request   Present at Visit   Present at Visit Patient   Spiritual Care Provided To   Spiritual Care Provided To Patient   Reason for Request   Reason for Request Emotional Support;Spiritual Support;Spiritual Assessment  (Provide information about AD)   Spiritual Assessment   Spiritual Assessment Difficulty Coping with Illness/Hospitalization;Patient Needs Spiritual/Emotional Support   Spiritual Care Interventions   Spiritual Care Interventions Advance Care Planning;Reflective and Compassionate Listening  (Patient wants to complete 5 wishes at home)   Length of Visit   Length of Visit 16-30 minutes   Follow-Up   Follow-up Follow-up to reassess

## 2018-11-28 NOTE — Progress Notes (Signed)
[facility]  PROGRESS NOTE      Patient: [name]  Date: 11/28/2018   LOS: 1 Days  Admission Date: 11/27/2018   MRN: [id number]  Attending: Clabe Seal, MD       Please contact me via cortext for routine matters and via phone [phone] for urgent matters.    INTERIM SUMMARY     58 year old alcoholic female admitted with complications of alcoholism including acute pancreatitis and falls.      SUBJECTIVE     [name] had some nausea this morning as well as abdominal pain as well as some headache  NAD otherwise    ASSESSMENT/PLAN     [name] is a 58 y.o. female     Chronic active alcoholism complicated by;   - Acute on chronic alcoholic pancreatitis with alcoholic gastritis   - Elevated anion gap metabolic acidosis  CIWA protocol for alcohol withdrawal initiated  Phenobarbital with symptom triggered oral diazepam  NPO except ice chips and relevant medications  Aggressive IVF, antiemetic and analgesic therapy  Check lactic acid, serum and urine ketones  Obtain CT imaging of the abdomen and pelvis  Thiamine, folic acid, multivitamin and magnesium supplementation  Fall, aspiration and seizure precautions  Counseling done on alcohol abuse cessation with patient asserting understanding  Telemetry monitoring  SW consult prior to discharge    Hyponatremia with hypochloremia and hypo-osmolality  urine studies  IVF for now   Repeat metabolic panel ordered    Left parietal scalp hematoma--from fall    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 out pt follow up    Essential HTN  Low normal on admission  Will hold amlodipine and Lasix and Aldactone on admission; reevaluate and resume when appropriate  Continue to monitor blood pressure    Chronic tobacco use disorder   Nicotine placement therapy ordered  Tobacco abuse cessation counseling done        DVT PPx: Mechanical   Dispo:  Supportive care for now.  Hold  diet for now.  IV hydration for now  Healthcare Proxy: Friend        Code Status: NO CPR - SUPPORT OK             MEDICATIONS     Current Facility-Administered Medications   Medication Dose Route Frequency    folic acid  1 mg Oral Daily    multivitamin  1 tablet Oral QAM    nicotine  1 patch Transdermal Daily    PHENobarbital  64.8 mg Oral Q8H    sodium chloride (PF)  3 mL Intravenous Q8H    sodium chloride (PF)  3 mL Intravenous Q8H    vitamin B-1  100 mg Oral Daily       PHYSICAL EXAM     Vitals:    11/28/18 1702   BP:    Pulse: 81   Resp: 16   Temp:    SpO2: 96%       Temperature: Temp  Min: 98 F (36.7 C)  Max: 99.1 F (37.3 C)  Pulse: Pulse  Min: 72  Max: 100  Respiratory: Resp  Min: 9  Max: 24  Non-Invasive BP: BP  Min: 106/69  Max: 140/83  Pulse Oximetry SpO2  Min: 92 %  Max: 99 %      Constitutional: Seems to be NAD   HEENT: NC, PERRL,EOMI. Neck supple. Periorbital ecchymosis  Cardiovascular: RRR, normal  S1 S2, no murmurs, gallops.  Respiratory:  CTA bilaterally Normal rate. No retractions or increased work of breathing.  Gastrointestinal: Normal BS, non-distended, soft, non-tender, no rebound or guarding.  Genitourinary: no suprapubic or costovertebral angle tenderness  Musculoskeletal: ROM and motor strength grossly normal. No clubbing, edema.   Skin: Periorbital ecchymosis. Other bruises sporadic such as knees   Neurologic: AAO x3.  CN 2-12 grossly intact. No gross motor or sensory deficits  Psychiatric: Affect and mood appropriate. Cooperative.     LABS     Recent Labs   Lab 11/28/18  0557 11/27/18  1905   WBC 6.0 6.6   RBC 2.63* 3.44*   Hemoglobin 9.4* 12.0   Hematocrit 27.3* 35.3*   MCV 104* 103*   PLT CT 202 177       Recent Labs   Lab 11/28/18  0557 11/27/18  1905   Sodium 127* 125*   Potassium 4.2 4.4   Chloride 97* 89*   CO2 14* 9.2*   BUN 16 16   Creatinine 0.98 1.20   Glucose 68* 71   Calcium 8.7 9.8   Magnesium 1.5*  --        Recent Labs   Lab 11/27/18  1905   ALT 38   39   AST (SGOT)  88*   87*   Bilirubin, Total 2.3*   2.3*   Bilirubin Direct 1.2*   Albumin 4.6   4.5   Alkaline Phosphatase 279*   285*       Recent Labs   Lab 11/27/18  1905   Troponin I 0.02             Microbiology Results     None           RADIOLOGY     Ct Abdomen Pelvis Wo Iv/ Wo Po Cont    Result Date: 11/28/2018  1. Findings compatible with acute pancreatitis. No evidence of fluid collection. 2. Additional chronic findings are stable. ReadingStation:[id number]    Xr Chest 2 Views    Result Date: 11/27/2018  No acute cardiopulmonary process. ReadingStation:[id number]    Ct Head Wo- (rad Read)    Result Date: 11/27/2018  No acute intracranial findings. No acute skull fracture. Left parietal scalp hematoma. 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 ReadingStation:[id number]      HH needs if any:  There are no questions and answers to display.       Nutrition assessment (if any) in collaboration with medical nutritionist :            Signed,  Clabe Seal, MD  5:09 PM 11/28/2018

## 2018-11-28 NOTE — Progress Notes (Signed)
Dr. Lanny Hurst in to see patient. Patient to remain NPO with sips of water with meds d/t pancreatitis. Pain medication changed to dilaudid.

## 2018-11-28 NOTE — Plan of Care (Signed)
Problem: Moderate/High Fall Risk Score >5  Description: Fall Risk Score > 5  Goal: Patient will remain free of falls  Outcome: Progressing

## 2018-11-29 LAB — CBC AND DIFFERENTIAL
Basophils %: 1.2 % (ref 0.0–3.0)
Basophils Absolute: 0.1 10*3/uL (ref 0.0–0.3)
Eosinophils %: 2.6 % (ref 0.0–7.0)
Eosinophils Absolute: 0.1 10*3/uL (ref 0.0–0.8)
Hematocrit: 25 % — ABNORMAL LOW (ref 36.0–48.0)
Hemoglobin: 8.4 gm/dL — ABNORMAL LOW (ref 12.0–16.0)
Lymphocytes Absolute: 1.6 10*3/uL (ref 0.6–5.1)
Lymphocytes: 35.5 % (ref 15.0–46.0)
MCH: 36 pg — ABNORMAL HIGH (ref 28–35)
MCHC: 33 gm/dL (ref 32–36)
MCV: 106 fL — ABNORMAL HIGH (ref 80–100)
MPV: 6.8 fL (ref 6.0–10.0)
Monocytes Absolute: 0.4 10*3/uL (ref 0.1–1.7)
Monocytes: 9.6 % (ref 3.0–15.0)
Neutrophils %: 51.1 % (ref 42.0–78.0)
Neutrophils Absolute: 2.4 10*3/uL (ref 1.7–8.6)
PLT CT: 106 10*3/uL — ABNORMAL LOW (ref 130–440)
RBC: 2.35 10*6/uL — ABNORMAL LOW (ref 3.80–5.00)
RDW: 16.4 % — ABNORMAL HIGH (ref 11.0–14.0)
WBC: 4.6 10*3/uL (ref 4.0–11.0)

## 2018-11-29 LAB — COMPREHENSIVE METABOLIC PANEL
ALT: 23 U/L (ref 0–55)
AST (SGOT): 48 U/L — ABNORMAL HIGH (ref 10–42)
Albumin/Globulin Ratio: 1.07 Ratio (ref 0.80–2.00)
Albumin: 3.2 gm/dL — ABNORMAL LOW (ref 3.5–5.0)
Alkaline Phosphatase: 173 U/L — ABNORMAL HIGH (ref 40–145)
Anion Gap: 15.7 mMol/L (ref 7.0–18.0)
BUN / Creatinine Ratio: 16.1 Ratio (ref 10.0–30.0)
BUN: 14 mg/dL (ref 7–22)
Bilirubin, Total: 1.9 mg/dL — ABNORMAL HIGH (ref 0.1–1.2)
CO2: 20 mMol/L (ref 20–30)
Calcium: 8.7 mg/dL (ref 8.5–10.5)
Chloride: 95 mMol/L — ABNORMAL LOW (ref 98–110)
Creatinine: 0.87 mg/dL (ref 0.60–1.20)
EGFR: 74 mL/min/{1.73_m2} (ref 60–150)
Globulin: 3 gm/dL (ref 2.0–4.0)
Glucose: 76 mg/dL (ref 71–99)
Osmolality Calculated: 254 mOsm/kg — ABNORMAL LOW (ref 275–300)
Potassium: 3.7 mMol/L (ref 3.5–5.3)
Protein, Total: 6.2 gm/dL (ref 6.0–8.3)
Sodium: 127 mMol/L — ABNORMAL LOW (ref 136–147)

## 2018-11-29 LAB — ECG 12-LEAD
P Wave Axis: 78 deg
P-R Interval: 190 ms
Patient Age: 58 years
Q-T Interval(Corrected): 487 ms
Q-T Interval: 398 ms
QRS Axis: 49 deg
QRS Duration: 94 ms
T Axis: 64 years
Ventricular Rate: 90 //min

## 2018-11-29 LAB — MAGNESIUM: Magnesium: 1.4 mg/dL — ABNORMAL LOW (ref 1.6–2.6)

## 2018-11-29 LAB — LIPASE: Lipase: 106 U/L — ABNORMAL HIGH (ref 8–78)

## 2018-11-29 LAB — KETONES, URINE

## 2018-11-29 MED ORDER — PANCRELIPASE (LIP-PROT-AMYL) 24000-76000 UNITS PO CPEP
1.00 | ORAL_CAPSULE | Freq: Three times a day (TID) | ORAL | Status: DC
Start: 2018-11-29 — End: 2018-12-01
  Administered 2018-11-29 – 2018-12-01 (×7): 24000 [IU] via ORAL
  Filled 2018-11-29 (×8): qty 1

## 2018-11-29 MED ORDER — SODIUM BICARBONATE 650 MG PO TABS
650.0000 mg | ORAL_TABLET | Freq: Three times a day (TID) | ORAL | Status: DC
Start: 2018-11-29 — End: 2018-12-01
  Administered 2018-11-29 – 2018-12-01 (×7): 650 mg via ORAL
  Filled 2018-11-29 (×8): qty 1

## 2018-11-29 NOTE — Nursing Progress Note (Signed)
Pt arrived from CDU @ 2000. A&Ox4, able to walk with standby assistance. Vitals taken and assessment completed. Telemetry monitor placed. C/o pain in back and hip, no abdominal pain. PRN meds given for pain. CIWA have been negative, States she drinks 3 vodka sprites every other day. Bruised all over from falling. Bed alarm on, VSS, call bell within reach, will continue to monitor.

## 2018-11-29 NOTE — Nursing Progress Note (Addendum)
NURSE NOTE SUMMARY  [facility] MEDICAL CENTER - [facility] PULMONARY RENAL UNIT   Patient Name: [name]   Attending Physician: Clabe Seal, MD   Today's date:   11/29/2018 LOS: 2 days   Shift Summary:                                                              Pt requested pain medication 3x this shift, see MAR. Effective. Ambulates well with SBA . Pt reports feeling better and quite hungry, clear liquid diet ordered, then advanced to full liquid. Continues on IV fluids as well..   Provider Notifications:      Rapid Response Notifications:  Mobility:      PMP Activity: Step 6 - Walks in Room (11/29/2018  7:51 AM)     Weight tracking:  Family Dynamic:   Last 3 Weights for the past 72 hrs (Last 3 readings):   Weight   11/28/18 2006 88.2 kg (194 lb 7.1 oz)   11/28/18 0710 69 kg (152 lb 1.9 oz)   11/27/18 1846 69 kg (152 lb 1.9 oz)             Recent Vitals Last Bowel Movement   BP: 101/67 (11/29/2018 11:11 AM)  Heart Rate: 89 (11/29/2018 11:11 AM)  Temp: 98.2 F (36.8 C) (11/29/2018 11:11 AM)  Resp Rate: 18 (11/29/2018 11:11 AM)  Height: 1.676 m (5\' 6" ) (11/28/2018  8:06 PM)  Weight: 88.2 kg (194 lb 7.1 oz) (11/28/2018  8:06 PM)  SpO2: 95 % (11/29/2018 11:11 AM)   No data recorded

## 2018-11-29 NOTE — UM Notes (Addendum)
[facility] Utilization Management Review Sheet    Facility :  [facility]    NAME: [name]  MR#: [id number]    DOB 14-Mar-1961          CSN#: [id number]    ROOM: 548/548-A AGE: 58 y.o.    ADMIT DATE AND TIME: 11/27/2018     2314  Pm         PATIENT CLASS: inpatient      ATTENDING PHYSICIAN: Clabe Seal, MD  PAYOR:Payor: MEDICAID HMO / Plan: Jackolyn Confer PLUS MEDICAID / Product Type: MANAGED MEDICAID /       AUTH #: [id number]    DIAGNOSIS:     ICD-10-CM    1. 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  Patient consumes 5 large glasses of vodka daily.  Recently discharged from this facility in the early part of July 2020 after being managed for acute on chronic alcohol induced pancreatitis.  In addition to the fall, patient admits to generalized weakness with nausea and vomiting.  She also has epigastric abdominal pain radiating straight to her back.  Has not been able to take care of her own medications because of that." per Dr Donalda Ewings  H&P note        11/27/18 1846 -- 98.1 F (36.7 C) Temporal 100 99 % -- 19 128/78 -- -- -- -- 9 abd pain              Labs  Wbc  6.6  H&H 12 and  35    Glucose   71   Na 125 chloride  89   Co2 9.2 anion gap  31.2  egfr  50  Osmolality  251   ast 87 alt 39 alk phos   285   Alb 4.5   t bili  2.3   Lipase  188   Trop  0.02  bnp  39.0    IMPRESSION: CXR  No acute cardiopulmonary process.        EKG:    Sinus rhythm @ 90  Abnormal R-wave progression, early transition   Borderline prolonged QT  interval   Compared to ECG 04/17/2015 13:50:43   No significant changes      IN ER Received    NS   1000 ml iv   Compazine   10 mg  Iv   Phenobarbital  260 mg  Iv           Assessment and Plan:                                                                Chronic active alcoholism complicated by;   - Acute on chronic alcoholic pancreatitis with alcoholic gastritis   - Elevated anion gap metabolic acidosis  CIWA protocol for alcohol withdrawal initiated  Phenobarbital with symptom triggered oral diazepam  NPO except ice chips and relevant medications  Aggressive IVF, antiemetic and analgesic therapy  Check lactic acid, serum and urine ketones  Obtain CT imaging of the abdomen and pelvis  Thiamine, folic acid, multivitamin and magnesium supplementation  Fall, aspiration and seizure precautions  Extensive counseling done on alcohol abuse cessation with patient asserting understanding  Telemetry monitoring  SBIRT consult prior to discharge    Hyponatremia with hypochloremia and hypo-osmolality  Obtain urine studies  Serum chloride repletion  Repeat metabolic panel ordered    Essential HTN  Low normal on admission  Will hold amlodipine and Lasix and Aldactone on admission; reevaluate and resume when appropriate  Continue to monitor blood pressure    Chronic tobacco use disorder   Nicotine placement therapy ordered  Tobacco abuse cessation counseling done  SBIRT consult prior to discharge      DVT PPx: Mechanical   Dispo: Inpatient  Healthcare Proxy: Friend  Code: do not resuscitate       11-27-2018 at   2314  Pm   to medical tele  Floor  Vs  q  4  I&O  CIWA   Asp sz prec  NS  75 ml hour iv continuous               11-28-2018  11/28/18 0751  98 F (36.7 C)    79  12  125/80  93 %  6-10 /10 pain    MG    11/28/18 0710                69 kg (152 lb 1.9 oz       Continues on medical tele  Floor  Vs  q  4  I&O NPO    CIWA   Asp sz prec NS 75 ml iv continuous d.c    LR  125 ml hour iv continuous    Folic acid   1 mg po daily nicoderm patch daily   Phenobarbital   64.8 mg   Po  q  8 hours    Thiamine  100 mg  Po daily      fentanyl  50 mcg iv  Prn had x  1  Dilaudid  0.5 mg iv  Prn had x 3    zofran   4  mg iv prn had x  1          Labs  Wbc 6.0 H&H 9.4 and  27.3   Glucose  68  Na 127   Chloride  97  CO2 14 anion gap  20.2  egfr  64 osmolality 255  Mag  1.5       UA  + ketones   Urine osmolaltiy  421 urine na  45       CT Head WO  IMPRESSION:   No acute intracranial findings. No acute skull fracture.  Left parietal scalp hematoma.  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    CT Abdomen Pelvis WO IV/ WO PO Cont   IMPRESSION:   1. Findings compatible with acute pancreatitis. No evidence of fluid collection.  2. Additional chronic findings are stable.          ASSESSMENT/PLAN     [name] is a 58 y.o. female     Chronic active alcoholism complicated by;  - Acute on chronic alcoholic pancreatitis with alcoholic gastritis  - Elevated anion gap metabolic acidosis  CIWAprotocol for alcohol withdrawal initiated  Phenobarbital with symptom triggered oral  diazepam  NPOexcept ice chips and relevant medications  Aggressive IVF, antiemetic and analgesic therapy  Check lactic acid,serum and urine ketones  Obtain CT imaging of theabdomen and pelvis  Thiamine, folic acid, multivitamin and magnesium supplementation  Fall, aspiration and seizure precautions  Counseling done on alcohol abuse cessation with patient asserting understanding  Telemetry monitoring  SWconsult prior to discharge    Hyponatremia with hypochloremia and hypo-osmolality  urine studies  IVF for now   Repeat metabolic panel ordered    Left parietal scalp hematoma--from fall    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 out pt follow up    Essential HTN  Low normal on admission  Will hold amlodipine and Lasix and Aldactone on admission; reevaluate  and resume when appropriate  Continue to monitor blood pressure    Chronic tobacco use disorder  Nicotine placement therapy ordered  Tobacco abuse cessation counseling done        DVT ZOX:[id number]  Dispo: Supportive care for now.  Hold diet for now.  IV hydration for now  Healthcare Proxy:Friend    Signed,  Clabe Seal, MD  5:09 PM 11/28/2018             CM Comments: 11/28/18 RNCM LB: Fall, syncope, N/V, hx alchoholism ascites, pancreatitits.  For CIWA, IVF, CT abd, SBIRT consult. Pnt lives with roommate Lyda Jester, no assistive device, frquent falls with bruising.  Drives some, Roommate drives if needed, has Medicaid but no card yet--provided her with Medicaid number and phone number for transport, she entered it in her phone. Has PCP first visit for 8/26[place], sees GI in [place]. Has not followed up on Sub abuse resources given last visit because she has a [employer] fast streamer thru previous employer that lasts thru 02/2019 and she thinks they will not pay if she gets any substance abuse tx. BUT she is not recievng the money now as she needs paperwork completed, she plans to see her old PCP in [place] for that. States has house full of food, roommate paying rent. States roommate Lyda Jester is also alcoholic.  May need PT eval for possible cane/walker/rollator. Chaplain will meet with pnt to complete AD.  Wilford Grist, RN, BSN, ACM  Case Management Emergency Department  [phone]  Office Hours: Monday - Friday 10 a.m. to 6 p.m.            DATE OF REVIEW: 11/29/2018    VITALS: BP 115/78    Pulse 81    Temp 97.9 F (36.6 C) (Temporal)    Resp 16    Ht 1.676 m (5\' 6" )    Wt 88.2 kg (194 lb 7.1 oz)    SpO2 92%    BMI 31.38 kg/m  10/10 abd pain this am      Continues on medical tele  Floor  Vs  q  4  I&O  CIWA   Asp sz prec   LR  125 ml hour iv continuous    Folic acid  1 mg po daily nicoderm patch daily   Phenobarbital   64.8 mg   Po  q  8 hours    Thiamine  100 mg  Po daily        Dilaudid   0.5 mg iv  Prn had x  2       Labs  Wbc  4.6  H&H 8.4 and  25.0  plt  106  Glucose   76  Na 127   Chloride  95   Osmolality   254 mag 1.4 ast 48 alk phos   173   Alb  3.2  t bili  1.9         Gavin Potters  Utilization Review  [facility]   Tel # [phone]  Fax #  [phone]

## 2018-11-29 NOTE — Progress Notes (Signed)
Quick Doc  [facility] MEDICAL CENTER - [facility] PULMONARY RENAL UNIT   Patient Name: [name]   Attending Physician: Clabe Seal, MD   Today's date:   11/29/2018 LOS: 2 days   Expected Discharge Date Expected Discharge Date: 12/02/18    Quick  Assessment:                                                              ReAdmit Risk Score: 18    CM Comments: (P) SW 11/29/18 SR: Chronic alcoholism/ alcoholic pancreatitis w/ alcoholic gastritis. Hyponatremia. CIWA protocol. NPO. IVF, antiemetic & analgesic therapy. Pt lives with roommate, independent with ADLs. Has bruises from falls, possibly while intoxicated. SW discussed private pay walker/cane if needed. Anticipate dispo home with friend support/transport. See SW consult for more details. SW following.    Physical Discharge Disposition: (P) Home                                                                          Physical Discharge Disposition: (P) Home       Provider Notifications:        Alphonse Guild, BSW  Social Worker  [phone]

## 2018-11-29 NOTE — Consults (Signed)
SW received consult for substance abuse, reviewed chart, and met with patient. She has been drinking for several years now, although she says she does not drink every day. When she does drink, she has "small" glasses of mixed drinks (reports her roommate has "large" glasses). Patient is interested in inpatient rehab treatment, however, she thinks that if she goes now she will not qualify for Medicare. Denied resources at this time as she has Pharmacist, hospital from previous visits.     Patient is also covered in bruises all over her body. This SW asked patient if the bruises were from falls or something else. Patient assured this SW that her roommate would never hurt her and that she has tripped over her small dog and fallen in her kitchen several times. No PT/OT consults placed for the patient at this time. SW informed her that she can private pay for a cane or a walker if she felt she needed it; she mentioned that she could go to the thrift store for one.     Patient's roommate will transport home when medically ready or she said she can ask a girlfriend.    No other SW needs at this time.     Alphonse Guild, BSW  Social Worker  [phone]

## 2018-11-29 NOTE — Progress Notes (Signed)
SOUND HOSPITALIST  PROGRESS NOTE      Patient: [name]  Date: 11/29/2018   LOS: 2 Days  Admission Date: 11/27/2018   MRN: [id number]  Attending: Clabe Seal, MD       Please contact me via cortext for routine matters and via phone [phone] for urgent matters.    INTERIM SUMMARY     58 year old alcoholic female admitted with complications of alcoholism including acute pancreatitis and falls.      SUBJECTIVE     [name] had some nausea this morning as well as abdominal pain as well as some headache  NAD otherwise    ASSESSMENT/PLAN     [name] is a 58 y.o. 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 out pt follow up    Essential HTN  Low normal on admission  Will hold amlodipine and Lasix and Aldactone on admission; reevaluate and resume when appropriate  Continue to monitor blood pressure    Chronic tobacco use disorder   Nicotine placement therapy ordered  Tobacco abuse cessation counseling done        DVT PPx: Mechanical   Dispo:  Supportive care for now. liquids  IV hydration for now  Healthcare Proxy: Friend        Code Status: NO CPR - SUPPORT OK             MEDICATIONS     Current Facility-Administered Medications   Medication Dose Route Frequency    folic acid  1 mg Oral Daily    multivitamin  1 tablet Oral QAM    nicotine  1 patch Transdermal Daily    pancrelipase (Lip-Prot-Amyl)  1 capsule Oral TID MEALS    PHENobarbital  64.8 mg Oral Q8H    sodium bicarbonate  650 mg Oral TID MEALS    sodium chloride (PF)  3 mL Intravenous Q8H    sodium  chloride (PF)  3 mL Intravenous Q8H    vitamin B-1  100 mg Oral Daily       PHYSICAL EXAM     Vitals:    11/29/18 1601   BP: 99/56   Pulse: 87   Resp: 16   Temp: 98.2 F (36.8 C)   SpO2: 90%       Temperature: Temp  Min: 97.3 F (36.3 C)  Max: 98.2 F (36.8 C)  Pulse: Pulse  Min: 78  Max: 91  Respiratory: Resp  Min: 16  Max: 21  Non-Invasive BP: BP  Min: 99/56  Max: 122/80  Pulse Oximetry SpO2  Min: 90 %  Max: 96 %      Constitutional: Seems to be NAD   HEENT: NC, PERRL,EOMI. Neck supple. Periorbital ecchymosis  Cardiovascular: RRR, normal S1 S2, no murmurs, gallops.  Respiratory:  CTA bilaterally Normal rate. No retractions or increased work of breathing.  Gastrointestinal: Normal BS, non-distended, soft, non-tender, no rebound or guarding.  Genitourinary: no suprapubic or costovertebral angle tenderness  Musculoskeletal: ROM and motor strength grossly normal. No clubbing, edema.  Skin: Periorbital ecchymosis. Other bruises sporadic such as knees   Neurologic: AAO x3.  CN 2-12 grossly intact. No gross motor or sensory deficits  Psychiatric: Affect and mood appropriate. Cooperative.     LABS     Recent Labs   Lab 11/29/18  0534 11/28/18  0557 11/27/18  1905   WBC 4.6 6.0 6.6   RBC 2.35* 2.63* 3.44*   Hemoglobin 8.4* 9.4* 12.0   Hematocrit 25.0* 27.3* 35.3*   MCV 106* 104* 103*   PLT CT 106* 202 177       Recent Labs   Lab 11/29/18  0534 11/28/18  0557 11/27/18  1905   Sodium 127* 127* 125*   Potassium 3.7 4.2 4.4   Chloride 95* 97* 89*   CO2 20 14* 9.2*   BUN 14 16 16    Creatinine 0.87 0.98 1.20   Glucose 76 68* 71   Calcium 8.7 8.7 9.8   Magnesium 1.4* 1.5*  --        Recent Labs   Lab 11/29/18  0534 11/27/18  1905   ALT 23 38   39   AST (SGOT) 48* 88*   87*   Bilirubin, Total 1.9* 2.3*   2.3*   Bilirubin Direct  --  1.2*   Albumin 3.2* 4.6   4.5   Alkaline Phosphatase 173* 279*   285*       Recent Labs   Lab 11/27/18  1905   Troponin I 0.02               RADIOLOGY     No results found.    HH needs if  any:  There are no questions and answers to display.       Nutrition assessment (if any) in collaboration with medical nutritionist :            Signed,  Clabe Seal, MD  5:36 PM 11/29/2018

## 2018-11-30 LAB — COMPREHENSIVE METABOLIC PANEL
ALT: 19 U/L (ref 0–55)
AST (SGOT): 38 U/L (ref 10–42)
Albumin/Globulin Ratio: 1 Ratio (ref 0.80–2.00)
Albumin: 2.7 gm/dL — ABNORMAL LOW (ref 3.5–5.0)
Alkaline Phosphatase: 151 U/L — ABNORMAL HIGH (ref 40–145)
Anion Gap: 9.4 mMol/L (ref 7.0–18.0)
BUN / Creatinine Ratio: 10.7 Ratio (ref 10.0–30.0)
BUN: 8 mg/dL (ref 7–22)
Bilirubin, Total: 1.2 mg/dL (ref 0.1–1.2)
CO2: 25 mMol/L (ref 20–30)
Calcium: 8.1 mg/dL — ABNORMAL LOW (ref 8.5–10.5)
Chloride: 98 mMol/L (ref 98–110)
Creatinine: 0.75 mg/dL (ref 0.60–1.20)
EGFR: 88 mL/min/{1.73_m2} (ref 60–150)
Globulin: 2.7 gm/dL (ref 2.0–4.0)
Glucose: 128 mg/dL — ABNORMAL HIGH (ref 71–99)
Osmolality Calculated: 259 mOsm/kg — ABNORMAL LOW (ref 275–300)
Potassium: 3.4 mMol/L — ABNORMAL LOW (ref 3.5–5.3)
Protein, Total: 5.4 gm/dL — ABNORMAL LOW (ref 6.0–8.3)
Sodium: 129 mMol/L — ABNORMAL LOW (ref 136–147)

## 2018-11-30 LAB — CBC AND DIFFERENTIAL
Basophils %: 0.4 % (ref 0.0–3.0)
Basophils Absolute: 0 10*3/uL (ref 0.0–0.3)
Eosinophils %: 2.2 % (ref 0.0–7.0)
Eosinophils Absolute: 0.1 10*3/uL (ref 0.0–0.8)
Hematocrit: 22.2 % — ABNORMAL LOW (ref 36.0–48.0)
Hemoglobin: 7.5 gm/dL — ABNORMAL LOW (ref 12.0–16.0)
Lymphocytes Absolute: 1.5 10*3/uL (ref 0.6–5.1)
Lymphocytes: 43.9 % (ref 15.0–46.0)
MCH: 36 pg — ABNORMAL HIGH (ref 28–35)
MCHC: 34 gm/dL (ref 32–36)
MCV: 106 fL — ABNORMAL HIGH (ref 80–100)
MPV: 7.1 fL (ref 6.0–10.0)
Monocytes Absolute: 0.4 10*3/uL (ref 0.1–1.7)
Monocytes: 12.7 % (ref 3.0–15.0)
Neutrophils %: 40.9 % — ABNORMAL LOW (ref 42.0–78.0)
Neutrophils Absolute: 1.4 10*3/uL — ABNORMAL LOW (ref 1.7–8.6)
PLT CT: 100 10*3/uL — ABNORMAL LOW (ref 130–440)
RBC: 2.09 10*6/uL — ABNORMAL LOW (ref 3.80–5.00)
RDW: 16.3 % — ABNORMAL HIGH (ref 11.0–14.0)
WBC: 3.5 10*3/uL — ABNORMAL LOW (ref 4.0–11.0)

## 2018-11-30 LAB — LIPASE: Lipase: 47 U/L (ref 8–78)

## 2018-11-30 LAB — MAGNESIUM: Magnesium: 1.1 mg/dL — ABNORMAL LOW (ref 1.6–2.6)

## 2018-11-30 MED ORDER — VH MAGNESIUM SULFATE 4 G IN 100 ML IV PREMIX
4.00 g | Freq: Once | INTRAVENOUS | Status: AC
Start: 2018-11-30 — End: 2018-11-30
  Administered 2018-11-30: 12:00:00 4 g via INTRAVENOUS
  Filled 2018-11-30: qty 100

## 2018-11-30 MED ORDER — VH POTASSIUM CHLORIDE CRYS ER 20 MEQ PO TBCR (WRAP)
40.00 meq | EXTENDED_RELEASE_TABLET | Freq: Once | ORAL | Status: AC
Start: 2018-11-30 — End: 2018-11-30
  Administered 2018-11-30: 11:00:00 40 meq via ORAL
  Filled 2018-11-30: qty 2

## 2018-11-30 MED ORDER — PANTOPRAZOLE SODIUM 40 MG PO TBEC
40.00 mg | DELAYED_RELEASE_TABLET | Freq: Every morning | ORAL | Status: DC
Start: 2018-11-30 — End: 2018-12-01
  Administered 2018-11-30 – 2018-12-01 (×2): 40 mg via ORAL
  Filled 2018-11-30 (×2): qty 1

## 2018-11-30 MED ORDER — VH POTASSIUM CHLORIDE CRYS ER 20 MEQ PO TBCR (WRAP)
20.00 meq | EXTENDED_RELEASE_TABLET | Freq: Once | ORAL | Status: AC
Start: 2018-11-30 — End: 2018-11-30
  Administered 2018-11-30: 21:00:00 20 meq via ORAL
  Filled 2018-11-30: qty 1

## 2018-11-30 NOTE — Progress Notes (Addendum)
SOUND HOSPITALIST  PROGRESS NOTE      Patient: [name]  Date: 11/30/2018   LOS: 3 Days  Admission Date: 11/27/2018   MRN: [id number]  Attending: Clabe Seal, MD       Please contact me via cortext for routine matters and via phone [phone] for urgent matters.    INTERIM SUMMARY     58 year old alcoholic female admitted with complications of alcoholism including acute pancreatitis and falls.      SUBJECTIVE     [name] had some nausea this morning as well as abdominal pain as well as some headache  NAD otherwise    ASSESSMENT/PLAN     [name] is a 58 y.o. female     Chronic active alcoholism complicated by;   - Acute on chronic alcoholic pancreatitis with alcoholic gastritis   - Elevated anion gap metabolic acidosis  CIWA protocol   IVF --[place]   Advance to cardiac diet    Hyponatremia --improved to 129.. 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 out pt follow up    Anemia--acute.. I suspect chronic component as well.  Acute part mainly probably from aggressive IV hydration dilutional.  I will check a stool occult blood to see any GI bleed component.  Hemoglobin dropped from 12-7.5 during this admission.    Thrombocytopenia--I suspect chronic component revealed with IV fluids dilutional effect  Platelet count dropped from 177-100    Essential HTN  Low normal on admission  Will hold amlodipine and Lasix and Aldactone on admission; reevaluate and resume when appropriate  Continue to monitor blood pressure  note : She reports sporadic use at home    Chronic tobacco use disorder   Nicotine placement therapy ordered  Tobacco abuse cessation counseling done    Follow  labs in the morning    DVT PPx: Mechanical   Dispo:  Advance diet.  Seems to be needs PT OT      Healthcare Proxy: Friend        Code Status: NO CPR - SUPPORT OK             MEDICATIONS     Current Facility-Administered Medications   Medication Dose Route Frequency    folic acid  1 mg Oral Daily    multivitamin  1 tablet Oral QAM    nicotine  1 patch Transdermal Daily    pancrelipase (Lip-Prot-Amyl)  1 capsule Oral TID MEALS    pantoprazole  40 mg Oral QAM AC    potassium chloride  20 mEq Oral Once    sodium bicarbonate  650 mg Oral TID MEALS    sodium chloride (PF)  3 mL Intravenous Q8H    sodium chloride (PF)  3 mL Intravenous Q8H    vitamin B-1  100 mg Oral Daily       PHYSICAL EXAM     Vitals:    11/30/18 1106   BP: 95/71   Pulse: 81   Resp: 16   Temp: 98.2 F (36.8 C)   SpO2: 96%       Temperature: Temp  Min: 97.7 F (36.5 C)  Max: 98.8 F (37.1 C)  Pulse: Pulse  Min: 79  Max: 90  Respiratory: Resp  Min: 16  Max: 19  Non-Invasive BP: BP  Min: 91/63  Max: 109/66  Pulse Oximetry SpO2  Min: 90 %  Max: 97 %      Constitutional: Seems to be NAD   HEENT: NC, PERRL,EOMI. Neck supple. Periorbital ecchymosis  Cardiovascular: RRR, normal S1 S2, no murmurs, gallops.  Respiratory:  CTA bilaterally Normal rate. No retractions or increased work of breathing.  Gastrointestinal: Normal BS, non-distended, soft, non-tender, no rebound or guarding.  Genitourinary: no suprapubic or costovertebral angle tenderness  Musculoskeletal: ROM and motor strength grossly normal. No clubbing, edema.   Skin: Periorbital ecchymosis. Other bruises sporadic such as knees   Neurologic: AAO x3.  CN 2-12 grossly intact. No gross motor or sensory deficits  Psychiatric: Affect and mood appropriate. Cooperative.     LABS     Recent Labs   Lab 11/30/18  0552 11/29/18  0534 11/28/18  0557   WBC 3.5* 4.6 6.0   RBC 2.09* 2.35* 2.63*   Hemoglobin 7.5* 8.4* 9.4*   Hematocrit 22.2* 25.0* 27.3*   MCV 106* 106* 104*   PLT CT 100* 106* 202        Recent Labs   Lab 11/30/18  0552 11/29/18  0534 11/28/18  0557 11/27/18  1905   Sodium 129* 127* 127* 125*   Potassium 3.4* 3.7 4.2 4.4   Chloride 98 95* 97* 89*   CO2 25 20 14* 9.2*   BUN 8 14 16 16    Creatinine 0.75 0.87 0.98 1.20   Glucose 128* 76 68* 71   Calcium 8.1* 8.7 8.7 9.8   Magnesium 1.1* 1.4* 1.5*  --        Recent Labs   Lab 11/30/18  0552 11/29/18  0534 11/27/18  1905   ALT 19 23 38   39   AST (SGOT) 38 48* 88*   87*   Bilirubin, Total 1.2 1.9* 2.3*   2.3*   Bilirubin Direct  --   --  1.2*   Albumin 2.7* 3.2* 4.6   4.5   Alkaline Phosphatase 151* 173* 279*   285*       Recent Labs   Lab 11/27/18  1905   Troponin I 0.02               RADIOLOGY     No results found.    HH needs if any:  There are no questions and answers to display.       Nutrition assessment (if any) in collaboration with medical nutritionist :            Signed,  Clabe Seal, MD  3:11 PM 11/30/2018

## 2018-11-30 NOTE — Nursing Progress Note (Signed)
NURSE NOTE SUMMARY  [facility] MEDICAL CENTER - [facility] PULMONARY RENAL UNIT   Patient Name: [name]   Attending Physician: Clabe Seal, MD   Today's date:   11/30/2018 LOS: 3 days   Shift Summary:                                                              Pt requested pain medication x2 , see MAR. Effective. Ambulating in rm with SBA. Diet upgraded to cardiac.   Provider Notifications:      Rapid Response Notifications:  Mobility:      PMP Activity: Step 6 - Walks in Room (11/30/2018  8:03 AM)     Weight tracking:  Family Dynamic:   Last 3 Weights for the past 72 hrs (Last 3 readings):   Weight   11/28/18 2006 88.2 kg (194 lb 7.1 oz)   11/28/18 0710 69 kg (152 lb 1.9 oz)   11/27/18 1846 69 kg (152 lb 1.9 oz)             Recent Vitals Last Bowel Movement   BP: 95/71 (11/30/2018 11:06 AM)  Heart Rate: 81 (11/30/2018 11:06 AM)  Temp: 98.2 F (36.8 C) (11/30/2018 11:06 AM)  Resp Rate: 16 (11/30/2018 11:06 AM)  SpO2: 96 % (11/30/2018 11:06 AM)   No data recorded

## 2018-11-30 NOTE — Nursing Progress Note (Signed)
Pt c/o consistent pain in back and hip. No abdominal pain noted. PRN medications given. Diet tolerated.

## 2018-12-01 LAB — CBC
Hematocrit: 21.9 % — ABNORMAL LOW (ref 36.0–48.0)
Hemoglobin: 7.4 gm/dL — ABNORMAL LOW (ref 12.0–16.0)
MCH: 37 pg — ABNORMAL HIGH (ref 28–35)
MCHC: 34 gm/dL (ref 32–36)
MCV: 108 fL — ABNORMAL HIGH (ref 80–100)
MPV: 7.1 fL (ref 6.0–10.0)
PLT CT: 101 10*3/uL — ABNORMAL LOW (ref 130–440)
RBC: 2.02 10*6/uL — ABNORMAL LOW (ref 3.80–5.00)
RDW: 17.1 % — ABNORMAL HIGH (ref 11.0–14.0)
WBC: 2.8 10*3/uL — ABNORMAL LOW (ref 4.0–11.0)

## 2018-12-01 LAB — MAGNESIUM: Magnesium: 1.7 mg/dL (ref 1.6–2.6)

## 2018-12-01 LAB — POTASSIUM: Potassium: 3.8 mMol/L (ref 3.5–5.3)

## 2018-12-01 MED ORDER — POTASSIUM CHLORIDE CRYS ER 20 MEQ PO TBCR
20.00 meq | EXTENDED_RELEASE_TABLET | Freq: Every day | ORAL | Status: DC
Start: 2018-12-01 — End: 2018-12-04

## 2018-12-01 MED ORDER — MAGNESIUM OXIDE 400 MG TABS (WRAP)
800.0000 mg | ORAL_TABLET | Freq: Two times a day (BID) | ORAL | 0 refills | Status: DC
Start: 2018-12-01 — End: 2018-12-12

## 2018-12-01 MED ORDER — THIAMINE HCL 100 MG PO TABS
100.0000 mg | ORAL_TABLET | Freq: Every day | ORAL | Status: DC
Start: 2018-12-02 — End: 2018-12-04

## 2018-12-01 MED ORDER — PANTOPRAZOLE SODIUM 40 MG PO TBEC
40.00 mg | DELAYED_RELEASE_TABLET | Freq: Every morning | ORAL | Status: DC
Start: 2018-12-01 — End: 2018-12-04

## 2018-12-01 MED ORDER — FOLIC ACID 1 MG PO TABS
1.0000 mg | ORAL_TABLET | Freq: Every day | ORAL | Status: DC
Start: 2018-12-02 — End: 2018-12-17

## 2018-12-01 NOTE — Discharge Instr - AVS First Page (Addendum)
Ask your pcp to order a cbc, bmp and potassium levels for you to be done before your Friday appointment as discussed.

## 2018-12-01 NOTE — Discharge Summary (Signed)
[facility]      Patient: [name]  Admission Date: 11/27/2018   DOB: 11-08-1960  Discharge Date: 12/01/2018    MRN: [id number]  Discharge Attending:Clabe Seal     Referring Physician: Penny Pia, MD  PCP: Penny Pia, MD       DISCHARGE SUMMARY        Discharge Diagnosis:   Active Hospital Problems    Diagnosis    Continuous chronic alcoholism      Chronic active alcoholism complicated by;  - Acute on chronic alcoholic pancreatitis / with possible alcoholic gastritis ?  - Elevated anion gap metabolic acidosis-improving   IVF --Hanson ed  Advanced to cardiac diet - tolerating     Hyponatremia --improved to 129.. 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 out pt follow up    Anemia--acute.. I suspect chronic component as well.  Acute part mainly if not all probably from aggressive IV hydration dilutional.   stool occult blood ordered but pt didn't have bm after the order  Hemoglobin dropped from 12-7.5 during this admission. However sh eis hemodynamically stable    Thrombocytopenia--I suspect chronic component revealed with IV fluids dilutional effect  Platelet count dropped from 177-100    Essential HTN  Low normal on admission  Will hold amlodipine and Aldactone on admission;: She reports sporadic use at home    Suspect she has alcoholic liver disease ( cirrhosis )  hold aldactone   Continue PPI  Resume lasix      Chronic tobacco use disorder  Nicotine placement therapy ordered  Tobacco abuse cessation counseling done      Subjective: Pt is feeling fine and wants to go home     Upon my exam today physical exam reveals no new findings to hold discharge.    Presentation History   See HPI for details.  Hospital  Course (36 Days)   58 year old alcoholic female admitted with complications of alcoholism including acute pancreatitis and falls. She wants to leave hospital today. I think its is reasonable . I acknowledged her drop in hg . I feel a chronic component . She got aggressive IVF hydration here. Not having bm last days of her stay here to sample it . If she has severe GIB that would give her a diarrhea. She can follow up her anemia as out pt. She agrees to do that. I counseled her about her alcoholism. She is receptive . She says her roommate who also drinks doesn't help.        Discharge Condition: Medically Stable  Consultants: none   Discharged to: Home     Discharge Medications:     Medication List      START taking these medications    folic acid 1 MG tablet  Commonly known as:  FOLVITE  Take 1 tablet (1 mg total) by mouth daily  Start taking on:  December 02, 2018     magnesium oxide 400 MG tablet  Commonly known as:  MAG-OX  Take 2 tablets (800 mg total) by mouth 2 (two) times daily     thiamine 100 MG tablet  Commonly known as:  B-1  Take 1 tablet (100 mg total) by mouth daily  Start taking on:  December 02, 2018        CHANGE how you take these medications    pantoprazole 40 MG tablet  Commonly known as:  Protonix  Take 1 tablet (40 mg total) by mouth every morning before breakfast  What changed:  when to take this     potassium chloride 20 MEQ tablet  Commonly known as:  KLOR-CON  Take 1 tablet (20 mEq total) by mouth daily (Take one tablet by mouth three times a day only on days you take a Lasix)  What changed:  when to take this        CONTINUE taking these medications    albuterol sulfate HFA 108 (90 Base) MCG/ACT inhaler  Commonly known as:  PROVENTIL     CREON PO     furosemide 40 MG tablet  Commonly known as:  LASIX     multivitamin with minerals tablet     naloxone 4 MG/0.1ML nasal spray  Commonly known as:  NARCAN  1 spray intranasally. If pt does not respond or relapses into respiratory depression call  911. Give additional doses every 2-3 min.     ondansetron 4 MG disintegrating tablet  Commonly known as:  ZOFRAN-ODT  Take 1 tablet (4 mg total) by mouth every 12 (twelve) hours as needed for Nausea     sodium bicarbonate 650 MG tablet        STOP taking these medications    amLODIPine 5 MG tablet  Commonly known as:  NORVASC     spironolactone 50 MG tablet  Commonly known as:  ALDACTONE           Where to Get Your Medications      You can get these medications from any pharmacy    You don't need a prescription for these medications   folic acid 1 MG tablet   magnesium oxide 400 MG tablet   thiamine 100 MG tablet     Information about where to get these medications is not yet available    Ask your nurse or doctor about these medications   pantoprazole 40 MG tablet   potassium chloride 20 MEQ tablet                  Vitals:    11/30/18 1106 11/30/18 1523 11/30/18 2258 12/01/18 0714   BP: 95/71 102/70 110/74 121/87   Pulse: 81 80 82 84   Resp: 16 16 18 18    Temp: 98.2 F (36.8 C) 98.8 F (37.1 C) 98.6 F (37 C) 98.1 F (36.7 C)   TempSrc: Temporal Temporal Temporal Temporal   SpO2: 96% 94% 96% 95%   Weight:       Height:           Procedures/Imaging:   Ct Abdomen Pelvis Wo Iv/ Wo Po Cont    Result Date: 11/28/2018  1. Findings compatible with acute pancreatitis. No evidence of fluid collection. 2. Additional chronic findings are stable. ReadingStation:[id number]    Xr Chest 2 Views    Result Date: 11/27/2018  No acute cardiopulmonary process. ReadingStation:[id number]    Ct Head Wo- (rad Read)    Result Date: 11/27/2018  No acute intracranial findings. No acute skull fracture. Left parietal scalp hematoma. 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 ReadingStation:[id number]    Echo Results     None                 Diagnostics  Last Labs   Recent Labs   Lab 12/01/18  0517 11/30/18  0552 11/29/18  0534   WBC 2.8* 3.5* 4.6   RBC 2.02* 2.09* 2.35*   Hemoglobin 7.4* 7.5* 8.4*    Hematocrit 21.9* 22.2* 25.0*   MCV 108* 106* 106*   PLT CT 101* 100* 106*       Recent Labs   Lab 12/01/18  0517 11/30/18  0552 11/29/18  0534 11/28/18  0557 11/27/18  1905   Sodium  --  129* 127* 127* 125*   Potassium 3.8 3.4* 3.7 4.2 4.4   Chloride  --  98 95* 97* 89*   CO2  --  25 20 14* 9.2*   BUN  --  8 14 16 16    Creatinine  --  0.75 0.87 0.98 1.20   Glucose  --  128* 76 68* 71   Calcium  --  8.1* 8.7 8.7 9.8   Magnesium 1.7 1.1* 1.4* 1.5*  --        Microbiology Results     None           Patient Instructions   Discharge Diet: Addressed. See AVS  Discharge Activity: Addressed. See AVS    Follow Up Appointment:  Follow-up Information     [facility] Follow up on 12/06/2018.    Contact information:  [address]  [place] [address]  [phone]           [facility] Follow up on 12/01/2018.    Why:  follow with them early september appointment                    Have your PCP repeat your CBC, BMP and Mg levels . He can order these at your follow up visit.        Time spent examining patient, discussing with patient/family regarding hospital course, chart review, reconciling medications and discharge planning: 45  minutes.    Signed,  Soloman Mckeithan    1:41 PM 12/01/2018

## 2018-12-01 NOTE — Progress Notes (Signed)
NURSE NOTE SUMMARY  [facility] MEDICAL CENTER - [facility] PULMONARY RENAL UNIT   Patient Name: [name]   Attending Physician: Clabe Seal, MD   Today's date:   12/01/2018 LOS: 4 days   Shift Summary:                                                              Pt A&0x4. VSS on RA. Pt given PRN pain medication for generalized pain. Scattered bruising r/t falls.Pt able to safely ambulate to bathroom. No BM this shift. All medication and care explained. All questions answered. Will continue to monitor.    Provider Notifications:      Rapid Response Notifications:  Mobility:      PMP Activity: Step 6 - Walks in Room (11/30/2018  8:05 PM)     Weight tracking:  Family Dynamic:   Last 3 Weights for the past 72 hrs (Last 3 readings):   Weight   11/28/18 2006 88.2 kg (194 lb 7.1 oz)   11/28/18 0710 69 kg (152 lb 1.9 oz)             Recent Vitals Last Bowel Movement   BP: 110/74 (11/30/2018 10:58 PM)  Heart Rate: 82 (11/30/2018 10:58 PM)  Temp: 98.6 F (37 C) (11/30/2018 10:58 PM)  Resp Rate: 18 (11/30/2018 10:58 PM)  SpO2: 96 % (11/30/2018 10:58 PM)   No data recorded

## 2018-12-03 ENCOUNTER — Other Ambulatory Visit (INDEPENDENT_AMBULATORY_CARE_PROVIDER_SITE_OTHER): Payer: Self-pay | Admitting: Nurse Practitioner

## 2018-12-03 ENCOUNTER — Telehealth (INDEPENDENT_AMBULATORY_CARE_PROVIDER_SITE_OTHER): Payer: Self-pay | Admitting: Family

## 2018-12-03 NOTE — Telephone Encounter (Signed)
Phone contact with patient to review plan of care following recent hospital discharge.  She reports she had chest pain, nausea, vomiting and dizziness and went to the ED on 11-27-2018.  She reports she was discharged on 12-01-2018 and that her Aldactone and Norvasc were both discontinued.  Since hospital discharge, she denies dizziness, chest pain, nausea or vomiting.  She reports she has home B/P cuff, but has not checked any B/P readings.  Instructed her to check B/P twice a day and record results.   Reviewed importance of hospital discharge follow up and for keeping appointments with health care providers.  She verbalizes understanding.  She reports she has appointment tomorrow to establish primary care at [facility]; ensured she has contact information to that office and instructed her to take all medication bottles and B/P readings to the appointment.  She reports she has appointment with [facility] on 12-11-2018 and appointment with [facility] on 12-27-2018.  Ensured that she has contact information to those offices.    Reviewed signs to seek medical attention.  Ensured that she has phone numbers to transition clinic if questions arise.

## 2018-12-04 ENCOUNTER — Other Ambulatory Visit (INDEPENDENT_AMBULATORY_CARE_PROVIDER_SITE_OTHER): Payer: Self-pay

## 2018-12-04 ENCOUNTER — Encounter (INDEPENDENT_AMBULATORY_CARE_PROVIDER_SITE_OTHER): Payer: Self-pay | Admitting: Family Medicine

## 2018-12-04 ENCOUNTER — Ambulatory Visit (INDEPENDENT_AMBULATORY_CARE_PROVIDER_SITE_OTHER): Payer: Medicaid Other | Admitting: Family Medicine

## 2018-12-04 ENCOUNTER — Telehealth: Payer: Self-pay

## 2018-12-04 VITALS — BP 136/80 | HR 81 | Temp 97.7°F | Resp 16 | Ht 66.05 in | Wt 155.0 lb

## 2018-12-04 DIAGNOSIS — Z7689 Persons encountering health services in other specified circumstances: Secondary | ICD-10-CM

## 2018-12-04 DIAGNOSIS — S0083XA Contusion of other part of head, initial encounter: Secondary | ICD-10-CM

## 2018-12-04 DIAGNOSIS — I10 Essential (primary) hypertension: Secondary | ICD-10-CM

## 2018-12-04 DIAGNOSIS — F17200 Nicotine dependence, unspecified, uncomplicated: Secondary | ICD-10-CM

## 2018-12-04 DIAGNOSIS — Z122 Encounter for screening for malignant neoplasm of respiratory organs: Secondary | ICD-10-CM

## 2018-12-04 DIAGNOSIS — E878 Other disorders of electrolyte and fluid balance, not elsewhere classified: Secondary | ICD-10-CM

## 2018-12-04 DIAGNOSIS — Z1231 Encounter for screening mammogram for malignant neoplasm of breast: Secondary | ICD-10-CM

## 2018-12-04 DIAGNOSIS — Z760 Encounter for issue of repeat prescription: Secondary | ICD-10-CM

## 2018-12-04 DIAGNOSIS — R110 Nausea: Secondary | ICD-10-CM

## 2018-12-04 DIAGNOSIS — K859 Acute pancreatitis without necrosis or infection, unspecified: Secondary | ICD-10-CM

## 2018-12-04 DIAGNOSIS — D539 Nutritional anemia, unspecified: Secondary | ICD-10-CM

## 2018-12-04 DIAGNOSIS — K860 Alcohol-induced chronic pancreatitis: Secondary | ICD-10-CM

## 2018-12-04 LAB — POTASSIUM: Potassium: 3.8 mmol/L (ref 3.5–5.2)

## 2018-12-04 MED ORDER — POTASSIUM CHLORIDE CRYS ER 20 MEQ PO TBCR
20.00 meq | EXTENDED_RELEASE_TABLET | Freq: Every day | ORAL | 5 refills | Status: DC
Start: 2018-12-04 — End: 2018-12-17

## 2018-12-04 MED ORDER — PANTOPRAZOLE SODIUM 40 MG PO TBEC
30.00 mg | DELAYED_RELEASE_TABLET | Freq: Every morning | ORAL | 5 refills | Status: DC
Start: 2018-12-04 — End: 2018-12-17

## 2018-12-04 MED ORDER — SODIUM BICARBONATE 650 MG PO TABS
650.0000 mg | ORAL_TABLET | Freq: Three times a day (TID) | ORAL | 5 refills | Status: DC
Start: 2018-12-04 — End: 2019-03-18

## 2018-12-04 MED ORDER — THIAMINE HCL 100 MG PO TABS
100.0000 mg | ORAL_TABLET | Freq: Every day | ORAL | 5 refills | Status: DC
Start: 2018-12-04 — End: 2019-05-19

## 2018-12-04 MED ORDER — FUROSEMIDE 40 MG PO TABS
40.0000 mg | ORAL_TABLET | Freq: Every day | ORAL | 5 refills | Status: DC | PRN
Start: 2018-12-04 — End: 2020-07-21

## 2018-12-04 MED ORDER — SPIRONOLACTONE 50 MG PO TABS
50.0000 mg | ORAL_TABLET | Freq: Every day | ORAL | 5 refills | Status: DC
Start: 2018-12-04 — End: 2019-03-18

## 2018-12-04 NOTE — Telephone Encounter (Signed)
PCP participates in Epic. Updates requested in Care Everywhere.

## 2018-12-04 NOTE — Progress Notes (Signed)
[facility]  [address]  [place], [place], [address]  [phone]  12/04/2018     Patient:   [name]                                                  CSN:        [id number]                                          DOB:       1961-02-18                                                    MRN:        [id number]     Chief Complaint   Patient presents with    Establish Care     transition clinic referral     SUBJECTIVE     History was provided by the  patient    HPI: Patient is a 58 y.o. female who presents today to establish care.   Previous PCP: Dr Bernerd Limbo [facility]   Last visit approx: a year ago.   Reason for transfer of care: Patient states that she is originally from [place].  She moved back to [place] from [place] in February         Questions/Concerns today: Patient was a follow-up from her [facility] hospitalization from August 19 to August 23 for acute on chronic pancreatitis.    Patient states that she has been having troubles with alcohol since the death of her son in 02-Sep-2001.  Her son died from methadone poisoning at the age of 58 y/o while incarcerated.  Patient admits that she also tried marijuana and crack cocaine in 09-02-2001 after the death of her son.  However she stopped substance use many years ago.  Patient states that she is never been to rehab for alcoholism due to fear that she might lose her long-term disability.  Patient states that she has been on long-term disability for almost 2 years and will run out at the end of this year.  Patient states that she has not done AA meetings since they have been done via telehealth and she does not have a computer right now.  She is compliant with her medications and needs a refill of them    Patient states that she has takes Lasix 40 mg as  needed lower extremity edema.  She take spironolactone 50 mg daily which she was told with help with liver problems    Patient states that she and her roommate have made a pack not buying more alcohol in the house.  Her roommate have told her that if he wants to buy alcohol he will buy beer since patient does not like it.    Patient states that she was found to have severe anemia with a hemoglobin levels of 7.3 no hospitalization.  She has not had any vaginal bleeding in years and she had a normal colonoscopy a year and a half ago.  Patient states  that she had a few benign polyps removed during her colonoscopy and she was told to follow-up in 5 years.    Patient states that she has had to get blood transfusions twice so far for severe anemia in the past.  Her last blood transfusion was 2 years ago.  She states that they have not been able to find the cause of her anemia.  However she always bruise very easily.  Patient's prothrombin time was noted to be elevated on labs done in July 2020.    Patient states that she does not recall having a Pap smear in the past.  She is also never had a mammogram    Patient reports that she was prescribed amlodipine 5 mg for hypertension.  However is only been causing her blood pressure to drop which is caused her to lose consciousness.  Patient states that this last occurred last week and she fell on her face which is the reason why she has a lot of bruises under her eye and on her face      Specialists: Patient has been scheduled with [facility] on December 27, 2018 at 11:15 am with Nyra Market PA.  Patient states that she does not want to see gastroenterology and [facility].    She states that she had surgery to place a stent in her pancreas about 2-1/2 years ago due to her missing 1 of the drains for her pancreas.  The surgery was done by specialist at [facility]    Patient has also been scheduled with Dr. Mellody Drown [facility]) on December 11, 2018, at 9:30 am.      Health Maintenance:      No LMP recorded. Patient is postmenopausal.  Mammogram: never        Tdap: Up-to-date  Flu:  Up-to-date      Employment: was a Engineer, production. Worked for [employer] in the past.  She last worked in 12/2016  Lives with: Roommate. Safe and comfortable environment.  Patient states that her roommate is so disabled rare and gets counseling  Tobacco: a pack last 3 days now.  Patient states that he used to smoke of 2 packs a day.  Patient states that she has been smoking since she was 58 years old .  She quit for 7 years and started smoking again.     Illegal drug use:  Years ago      Past Medical History:   Diagnosis Date    Ascites     Asthma without status asthmaticus     Clostridium difficile colitis     Disorder of liver     multifocal cystic irregularities    DVT (deep venous thrombosis)     Gastroesophageal reflux disease     H/O ETOH abuse     sober 2 years until 03/2014    Hypertension     Hypokalemia     Pancreatitis     S/P IVC filter      Past Surgical History:   Procedure Laterality Date    CHOLECYSTECTOMY      COLONOSCOPY  12/26/2012    Procedure: COLONOSCOPY;  Surgeon: Gwenith Spitz, MD;  Location: [facility];  Service: Gastroenterology;  Laterality: N/A;    COLONOSCOPY, POLYPECTOMY  12/26/2012    Procedure: COLONOSCOPY, POLYPECTOMY;  Surgeon: Gwenith Spitz, MD;  Location: [facility];  Service: Gastroenterology;  Laterality: N/A;    DEBRIDEMENT & IRRIGATION, WOUND CLOSURE Right 08/29/2018    Procedure: WASHOUT AND CLOSURE  OF TRAUMATIC WOUND, RIGHT FOREARM;  Surgeon: Arnoldo Hooker, MD;  Location: [facility];  Service: General;  Laterality: Right;  Right forearm I&D poss. wound closure    PANCREATECTOMY  01/2011    partial    THORACENTESIS Right 2012    pl effusion     Family History   Adopted: Yes     Social History     Social History Narrative    Not on file     Allergies   Allergen Reactions    Morphine Hallucinations      Outpatient Medications Marked as Taking for the 12/04/18 encounter (Office Visit) with Penny Pia, MD   Medication Sig Dispense Refill    albuterol (PROVENTIL HFA;VENTOLIN HFA) 108 (90 Base) MCG/ACT inhaler Inhale 2 puffs into the lungs every 6 (six) hours as needed for Wheezing      furosemide (LASIX) 40 MG tablet Take 40 mg by mouth daily as needed (swelling in feet or weight gain)         Multiple Vitamins-Minerals (MULTIVITAMIN WITH MINERALS) tablet Take 1 tablet by mouth every morning.         ondansetron (ZOFRAN-ODT) 4 MG disintegrating tablet Take 1 tablet (4 mg total) by mouth every 12 (twelve) hours as needed for Nausea 20 tablet 0    Pancrelipase, Lip-Prot-Amyl, (CREON PO) Take 24,000 Unit by mouth 3 (three) times daily with meals      pantoprazole (Protonix) 40 MG tablet Take 1 tablet (40 mg total) by mouth every morning before breakfast      potassium chloride (KLOR-CON) 20 MEQ tablet Take 1 tablet (20 mEq total) by mouth daily (Take one tablet by mouth three times a day only on days you take a Lasix)      sodium bicarbonate 650 MG tablet Take 650 mg by mouth 3 (three) times daily      spironolactone (ALDACTONE) 50 MG tablet Take 50 mg by mouth daily Pt take one q day        vitamin B-1 (B-1) 100 MG tablet Take 1 tablet (100 mg total) by mouth daily         Review of Systems  Review of Systems: Items that patient complains of are in bold.  Items that the patient denies are not bolded.   General: Fever, Chills.   Eyes: Discharge  Ears/Nose/Throat: Earache, Congestion, Sore Throat.   Cardiovascular: Chest Pain, Palpitations, Peripheral Edema.   Respiratory: Cough, Dyspnea.   Gastrointestinal: Nausea, Vomiting, Diarrhea, Constipation, Abdominal Pain, Melena, Hematochezia.   Genitourinary:  Dysuria, Hematuria, Discharge or Incontinence.  Musculoskeletal:  Joint Pain.  Falls  Skin: Rash   Neurologic: Weakness.   Heme/Lymphatic: Abnormal Bruising.        PHYSICAL EXAM   BP 136/80 (BP Site:  Right arm, Patient Position: Sitting, Cuff Size: Large)    Pulse 81    Temp 97.7 F (36.5 C) (Axillary)    Resp 16    Ht 1.678 m (5' 6.05")    Wt 70.3 kg (155 lb)    SpO2 99%    BMI 24.98 kg/m     Physical Exam    Constitutional: Well developed, well nourished, active, in no apparent distress.  HENT:   Head: Normocephalic, atraumatic  Ears: No external lesions.  Nose: No external lesions. No epistaxis or drainage.  Eyes: PERRL. No scleral icterus. No conjunctival injection. EOMI. ecchymosis is noted around eyes and on face  Neck: Trachea is midline. No JVD. Normal range of motion. No  apparent masses.  Cardiovascular: Regular rhythm, S1 normal and S2 normal.    No murmur heard.  Pulmonary/Chest: Effort normal. Lungs clear to auscultation bilaterally.   Abdominal: Soft, non-tender, non-distended. No masses.   Genitourinay/Anorectal: Defferred  Musculoskeletal: Normal range of motion. Gait normal. No deformity or apparent injury.   Neurological: Pt is alert. Cranial nerves are grossly intact. Moving all extremities without apparent deficit.   Psychiatric: Affect is appropriate. There is no agitation.   Skin: Skin is warm, dry, well perfused. No rash noted. No cyanosis. No pallor. No apparent wound.      LABS     Lab Results   Component Value Date    CHOL 172 10/15/2018    HDL 70 (H) 10/15/2018    TRIG 97 10/15/2018    NA 129 (L) 11/30/2018    K 3.8 12/03/2018    BUN 8 11/30/2018    GLU 128 (H) 11/30/2018    CA 8.1 (L) 11/30/2018    AST 38 11/30/2018    ALT 19 11/30/2018    ALKPHOS 151 (H) 11/30/2018    TSH 0.71 04/17/2015         UPDATED IMMUNIZATIONS     Immunization History   Administered Date(s) Administered    Influenza quadrivalent (IM) PF 3 Yrs & greater 12/26/2017    Tdap 02/28/2018         ASSESSMENT and PLAN     1. Encounter to establish care  CBC and differential    Comprehensive metabolic panel    IRON PROFILE    Ferritin    Prothrombin time/INR    TSH, Abn Reflex to Free T4, Serum    Vitamin B12 And  Folate    Vitamin D,25 OH, Total   2. Anemia, macrocytic  CBC and differential    Comprehensive metabolic panel    IRON PROFILE    Ferritin    Prothrombin time/INR    TSH, Abn Reflex to Free T4, Serum    Vitamin B12 And Folate    Vitamin D,25 OH, Total    Ambulatory referral to Hematology    CT SCREENING LUNG LOW DOSE   3. Essential hypertension  CBC and differential    Comprehensive metabolic panel    IRON PROFILE    Ferritin    Prothrombin time/INR    TSH, Abn Reflex to Free T4, Serum    Vitamin B12 And Folate    Vitamin D,25 OH, Total   4. Acute on chronic pancreatitis  CBC and differential    Comprehensive metabolic panel    IRON PROFILE    Ferritin    Prothrombin time/INR    TSH, Abn Reflex to Free T4, Serum    Vitamin B12 And Folate    Vitamin D,25 OH, Total   5. Chronic alcoholic pancreatitis  CBC and differential    Comprehensive metabolic panel    IRON PROFILE    Ferritin    Prothrombin time/INR    TSH, Abn Reflex to Free T4, Serum    Vitamin B12 And Folate    Vitamin D,25 OH, Total   6. Electrolyte abnormality  Magnesium    Phosphorus   7. Contusion of face, initial encounter     8. Breast cancer screening by mammogram  Mammo Digital Screening Bilateral W Cad   9. Encounter for screening for lung cancer  CT SCREENING LUNG LOW DOSE   10. Tobacco use disorder         Will order labwork as noted for her  chronic medical problems as above.  Previous Records reviewed:   The importance of abstaining from alcohol and tobacco use were discussed at length with patient  Hematology referral was placed for further management of severe anemia.  ED precautions were given        Follow up 1-2 weeks for a Pap smear and lab results review    Penny Pia, MD

## 2018-12-04 NOTE — UM Notes (Signed)
DISCHARGE INFORMATION  [facility]    IP auth # [id number]    PLEASE NOTE:  Patient DISCHARGED on 12/01/18    PATIENT:  [name]  DOB 12/31/1960    Please call RN with any questions  Phone [phone]  Fax [phone]  Thank you

## 2018-12-04 NOTE — Patient Instructions (Addendum)
Hi [name],    I have included a list of local counselors below.   1. [facility]: [phone]   [facility]: [phone]      2. [facility]: [phone]      3. [facility]: [phone]     Dr Bosie Clos        Anemia  Anemia is a condition that occurs when your body does not have enough healthy red blood cells (RBCs). RBCs are the parts of your blood that carry oxygen all over your body. A protein called hemoglobin allows your RBCs to absorb and release oxygen. Without enough RBCs or hemoglobin, your body doesn't get enough oxygen. Symptoms of anemia may then occur.     What are the symptoms of anemia?  Some people with anemia have no symptoms. But most people have symptoms that range from mild to severe. These can include:    Tiredness (fatigue)   Weakness   Pale skin   Shortness of breath   Dizziness or fainting   Rapid heartbeat   Trouble doing normal amounts of activity   Yellowing of your eyes, skin, or mouth and dark urine (jaundice)  What causes anemia?  Anemia can occur when your body:   Loses too much blood   Does not make enough RBCs   Destroys your RBCs at a faster rate than it can replace them   Does not make a normal amount of hemoglobin in your RBCs  These problems can occur for many reasons, including:   A condition that you are born with (congenital or inherited), such as sickle cell disease or thalassemia   Heavy bleeding for any reason, including injury, surgery, childbirth, or even heavy menstrual periods   Being low in certain nutrients, such as iron, folate, or vitamin B-12   Certain long-term (chronic) conditions such as diabetes, arthritis, or kidney disease   Certain chronic infections such as tuberculosis or HIV   Exposure to certain medicines, such as those used for chemotherapy  There are different types of anemia. Your healthcare provider can tell you more about the type of  anemia you have and what may have caused it.   How is anemia diagnosed?  To diagnose anemia, your healthcare provider orders blood tests. These can include:   Complete blood cell count (CBC). This test measures the amounts of the different types of blood cells.   Blood smear. This test checks the size and shape of your blood cells. To do the test, a drop of your blood is looked at under a microscope. A stain is used to make the blood cells easier to see.   Iron studies. These tests measure the amount of iron in your blood. Your body needs iron to make hemoglobin in your RBCs.   Vitamin B-12 and folate studies. These tests check for some of the components that help give RBCs a normal size and shape.   Reticulocyte count. This test measures the amount of new RBCs that your bone marrow makes.   Hemoglobin electrophoresis. This test checks for problems with your hemoglobin in RBCs.   Bone marrow biopsy. This test evaluates the bone marrow where RBCs are made.  How is anemia treated?  Treatment for anemia is based on the type of anemia, its cause, and the severity of your symptoms. Treatments may include:    Diet changes. This includes increasing the amount of certain nutrients in your diet, such  as iron, vitamin B-12, or folate. Your healthcare provider may also prescribe nutrient supplements.   Medicines. Certain medicines treat the cause of your anemia. Others help build new RBCs or ease symptoms. If a medicine is the cause of your anemia, you may need to stop or change it.   Blood transfusions. Replacing some of your blood can increase the number of healthy RBCs in your body.   Surgery. In some cases, your healthcare provider may do surgery to treat the underlying cause of anemia. If you need surgery, your healthcare provider will explain the procedure and outline the risks and benefits for you.  What are the long-term concerns?  If you have a certain type of anemia, you can expect a full recovery after  treatment. If you have other types of anemia (especially a type you're born with), you will need to manage it for life. Your healthcare provider can tell you more.   StayWell last reviewed this educational content on 07/09/2017   2000-2020 [employer] [place]. [address] [place], [place] [address]. All rights reserved. This information is not intended as a substitute for professional medical care. Always follow your healthcare professional's instructions.

## 2018-12-05 ENCOUNTER — Other Ambulatory Visit (INDEPENDENT_AMBULATORY_CARE_PROVIDER_SITE_OTHER): Payer: Self-pay | Admitting: Family Medicine

## 2018-12-06 ENCOUNTER — Other Ambulatory Visit (INDEPENDENT_AMBULATORY_CARE_PROVIDER_SITE_OTHER): Payer: Self-pay

## 2018-12-06 LAB — CBC AND DIFFERENTIAL
Baso(Absolute): 0 10*3/uL (ref 0.0–0.2)
Basos: 1 %
Eos: 2 %
Eosinophils Absolute: 0.1 10*3/uL (ref 0.0–0.4)
Hematocrit: 24.9 % — ABNORMAL LOW (ref 34.0–46.6)
Hemoglobin: 8.4 g/dL — ABNORMAL LOW (ref 11.1–15.9)
Immature Granulocytes Absolute: 0.1 10*3/uL (ref 0.0–0.1)
Immature Granulocytes: 2 %
Lymphocytes Absolute: 1.3 10*3/uL (ref 0.7–3.1)
Lymphocytes: 33 %
MCH: 34.9 pg — ABNORMAL HIGH (ref 26.6–33.0)
MCHC: 33.7 g/dL (ref 31.5–35.7)
MCV: 103 fL — ABNORMAL HIGH (ref 79–97)
Monocytes Absolute: 0.7 10*3/uL (ref 0.1–0.9)
Monocytes: 17 %
Neutrophils Absolute: 1.8 10*3/uL (ref 1.4–7.0)
Neutrophils: 45 %
Platelets: 233 10*3/uL (ref 150–450)
RBC: 2.41 x10E6/uL — CL (ref 3.77–5.28)
RDW: 16.2 % — ABNORMAL HIGH (ref 11.7–15.4)
WBC: 4 10*3/uL (ref 3.4–10.8)

## 2018-12-06 LAB — COMPREHENSIVE METABOLIC PANEL
ALT: 43 IU/L — ABNORMAL HIGH (ref 0–32)
AST (SGOT): 104 IU/L — ABNORMAL HIGH (ref 0–40)
Albumin/Globulin Ratio: 1.4 (ref 1.2–2.2)
Albumin: 3.9 g/dL (ref 3.8–4.9)
Alkaline Phosphatase: 174 IU/L — ABNORMAL HIGH (ref 39–117)
BUN / Creatinine Ratio: 12 (ref 9–23)
BUN: 11 mg/dL (ref 6–24)
Bilirubin, Total: 0.6 mg/dL (ref 0.0–1.2)
CO2: 20 mmol/L (ref 20–29)
Calcium: 9.1 mg/dL (ref 8.7–10.2)
Chloride: 97 mmol/L (ref 96–106)
Creatinine: 0.94 mg/dL (ref 0.57–1.00)
EGFR: 67 mL/min/{1.73_m2} (ref >59)
EGFR: 77 mL/min/{1.73_m2} (ref >59)
Globulin, Total: 2.7 g/dL (ref 1.5–4.5)
Glucose: 86 mg/dL (ref 65–99)
Potassium: 4 mmol/L (ref 3.5–5.2)
Protein, Total: 6.6 g/dL (ref 6.0–8.5)
Sodium: 134 mmol/L (ref 134–144)

## 2018-12-06 LAB — VITAMIN B12 AND FOLATE
Folate: 4.9 ng/mL (ref >3.0)
Vitamin B-12: 390 pg/mL (ref 232–1245)

## 2018-12-06 LAB — THYROID STIMULATING HORMONE (TSH), REFLEX ON ABNORMAL TO FREE T4, SERUM: TSH: 0.754 u[IU]/mL (ref 0.450–4.500)

## 2018-12-06 LAB — IRON PROFILE
Iron Saturation: 22 % (ref 15–55)
Iron: 70 ug/dL (ref 27–159)
TIBC: 317 ug/dL (ref 250–450)
UIBC: 247 ug/dL (ref 131–425)

## 2018-12-06 LAB — VITAMIN D,25 OH,TOTAL: Vitamin D 25-Hydroxy: 14.1 ng/mL — ABNORMAL LOW (ref 30.0–100.0)

## 2018-12-06 LAB — FERRITIN: Ferritin: 232 ng/mL — ABNORMAL HIGH (ref 15–150)

## 2018-12-06 LAB — MAGNESIUM: Magnesium: 1.5 mg/dL — ABNORMAL LOW (ref 1.6–2.3)

## 2018-12-06 LAB — PHOSPHORUS: Phosphorus: 3.2 mg/dL (ref 3.0–4.3)

## 2018-12-06 NOTE — Progress Notes (Signed)
Confirmed with [name] of her appointments and that she has transprtation  for the following:  Dr. Mellody Drown (Cardiology) 12/11/2018 and J. Channing Mutters ([facility]) 9/18

## 2018-12-07 LAB — PT/INR
PT INR: 1.1 (ref 0.8–1.2)
PT: 11.7 s (ref 9.1–12.0)

## 2018-12-11 ENCOUNTER — Ambulatory Visit: Payer: Self-pay | Admitting: Internal Medicine

## 2018-12-12 ENCOUNTER — Other Ambulatory Visit (INDEPENDENT_AMBULATORY_CARE_PROVIDER_SITE_OTHER): Payer: Self-pay | Admitting: Family Medicine

## 2018-12-12 ENCOUNTER — Encounter (INDEPENDENT_AMBULATORY_CARE_PROVIDER_SITE_OTHER): Payer: Self-pay

## 2018-12-12 DIAGNOSIS — E559 Vitamin D deficiency, unspecified: Secondary | ICD-10-CM

## 2018-12-12 MED ORDER — MAGNESIUM OXIDE 400 MG TABS (WRAP)
800.0000 mg | ORAL_TABLET | Freq: Three times a day (TID) | ORAL | 1 refills | Status: DC
Start: 2018-12-12 — End: 2019-05-27

## 2018-12-12 MED ORDER — CHOLECALCIFEROL 1.25 MG (50000 UT) PO CAPS
50000.00 [IU] | ORAL_CAPSULE | ORAL | 0 refills | Status: DC
Start: 2018-12-12 — End: 2019-01-07

## 2018-12-17 ENCOUNTER — Other Ambulatory Visit (INDEPENDENT_AMBULATORY_CARE_PROVIDER_SITE_OTHER): Payer: Self-pay

## 2018-12-17 ENCOUNTER — Encounter (INDEPENDENT_AMBULATORY_CARE_PROVIDER_SITE_OTHER): Payer: Self-pay | Admitting: Family Medicine

## 2018-12-17 ENCOUNTER — Ambulatory Visit (INDEPENDENT_AMBULATORY_CARE_PROVIDER_SITE_OTHER): Payer: Medicaid Other | Admitting: Family Medicine

## 2018-12-17 VITALS — BP 127/88 | HR 72 | Temp 96.6°F | Resp 16 | Wt 152.6 lb

## 2018-12-17 DIAGNOSIS — E559 Vitamin D deficiency, unspecified: Secondary | ICD-10-CM

## 2018-12-17 DIAGNOSIS — R748 Abnormal levels of other serum enzymes: Secondary | ICD-10-CM

## 2018-12-17 DIAGNOSIS — F1020 Alcohol dependence, uncomplicated: Secondary | ICD-10-CM

## 2018-12-17 DIAGNOSIS — D539 Nutritional anemia, unspecified: Secondary | ICD-10-CM

## 2018-12-17 DIAGNOSIS — Z0289 Encounter for other administrative examinations: Secondary | ICD-10-CM

## 2018-12-17 DIAGNOSIS — K861 Other chronic pancreatitis: Secondary | ICD-10-CM

## 2018-12-17 DIAGNOSIS — E878 Other disorders of electrolyte and fluid balance, not elsewhere classified: Secondary | ICD-10-CM

## 2018-12-17 MED ORDER — PANTOPRAZOLE SODIUM 40 MG PO TBEC
30.00 mg | DELAYED_RELEASE_TABLET | Freq: Every morning | ORAL | 1 refills | Status: DC
Start: 2018-12-17 — End: 2019-01-07

## 2018-12-17 MED ORDER — POTASSIUM CHLORIDE CRYS ER 20 MEQ PO TBCR
20.00 meq | EXTENDED_RELEASE_TABLET | Freq: Three times a day (TID) | ORAL | 1 refills | Status: DC
Start: 2018-12-17 — End: 2019-01-17

## 2018-12-17 MED ORDER — FOLIC ACID 1 MG PO TABS
1.0000 mg | ORAL_TABLET | Freq: Every day | ORAL | 1 refills | Status: DC
Start: 2018-12-17 — End: 2019-05-19

## 2018-12-17 NOTE — Patient Instructions (Signed)
Signs of Alcohol Addiction (Alcoholism)   Do you want to have more fun, to fit in, to cope better with your problems? It’s as easy as taking a drink—if you believe what you see on TV and in movies. But don't fool yourself. The more you regularly rely on alcohol to relax you or make you feel good, the closer you move toward addiction. If you decide you are on the path to addiction, you can take action to change your behavior. And you can find caring people to help you.  Check your addiction level  You may drink to feel more likable, to loosen up, or to relax. But drinking has a serious downside. Alcohol can lead to slurred speech, poor judgment, and risky behavior. Alcohol can also lead to serious health problems. These include liver disease and heart disease. It can also cause loss of mental function. To find out if you may have a problem with alcohol, read and answer the statements below. Answering yes to 3 or more questions may be a sign that alcohol is taking over your life:     Yes No      ? ? Do you think a party or social gathering isn’t fun unless alcohol is served?   ? ? Have family members, friends, or coworkers ever commented on your drinking?   ? ? Do you have friends you drink with?   ? ? Do you look forward to your next drink?   ? ? If you only drink after work or on weekends, do you think you don’t have a problem?   ? ? Are family members or friends beginning to avoid you?   ? ? Have you unsuccessfully tried to cut down or quit using alcohol?   ? ? Do you hide your use from other people?   ? ? Are you beginning to distrust and avoid some people?   ? ? Do you get up the day after drinking and not remember what happened the night before?   ? ? Do you have health problems as a result of your drinking?   StayWell last reviewed this educational content on 02/08/2018  © 2000-2020 [employer] [address], [place] [address]. All rights reserved. This information is not intended as a  substitute for professional medical care. Always follow your healthcare professional's instructions.

## 2018-12-17 NOTE — Progress Notes (Signed)
[facility]  [address]  [place], [place], [address]  [phone]  12/17/2018     Patient:   [name]                                                  CSN:        [id number]                                          DOB:       03/16/1961                                                    MRN:        [id number]     Chief Complaint   Patient presents with    Follow-up     disability paper completion       SUBJECTIVE     History was provided by the  patient    HPI: Patient is a 58 y.o. female who presents today to have long-term disability papers completed with [employer] [place].  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  Patient has had chronic pancreatitis since 2010-08-30.  Her pancreatitis stems from her alcohol use disorder which is been going on since the death of her son in Aug 29, 2001.    Patient has had numerous hospitalizations due to complications of her alcohol use disorder.  Patient states that she is never gone to rehab before but she is considering going to rehab in a couple of months when her Social Security disability starts in November.    States that her long-term disability was stopped on July 23 because she did not get her form completed by a physician.  So she would like to get the form completed again so that she will have money to pay her bills.    Patient admits to drinking vodka with soda with her roommate this weekend.  However she states that she is significantly decreased the amount of alcohol she drinks.    Patient states that she make sure she keeps the appointment with [facility] gastroenterology on September 18.  She states that she missed her recent  appointment with cardiology due  to having a fever and a sore throat that day.  Patient states that her symptoms resolved spontaneously with time.  She has reschedule her appointment with cardiology for October 5th    Patient states that she was able to get some of her medication including vitamin D supplementation to treat her vitamin D deficiency    However she has not taken magnesium supplements since her hospital discharge on August 23.  Patient states that her pharmacist told her that the magnesium should be ready for her to pick up today       Review of Systems  Review of Systems: Items that patient complains of are in bold.  Items that the patient denies are not bolded.   General: Fever, Chills. Chronic fatigue  Eyes: Discharge  Ears/Nose/Throat: Earache, Congestion, Sore Throat.   Cardiovascular: Chest Pain, Palpitations, Peripheral Edema.   Respiratory: Cough, Dyspnea.   Gastrointestinal: Nausea, Vomiting, Diarrhea, Constipation, Abdominal Pain, Melena, Hematochezia.   Genitourinary:  Dysuria, Hematuria, Discharge or Incontinence.  Musculoskeletal:  Joint Pain.  Falls  Skin: Rash   Neurologic: Weakness.   Heme/Lymphatic: Abnormal Bruising.        PHYSICAL EXAM     Vitals:    12/17/18 0908   BP: 127/88   Pulse: 72   Resp: 16   Temp: (!) 96.6 F (35.9 C)   SpO2: 100%     Physical Exam    Constitutional: Well developed, well nourished, active, in no apparent distress.  HENT:   Head: Normocephalic, atraumatic  Ears: No external lesions.  Nose: No external lesions. No epistaxis or drainage.  Eyes: PERRL. No scleral icterus. No conjunctival injection. EOMI. ecchymosis is noted around eyes and on face  Neck: Trachea is midline. No JVD. Normal range of motion. No apparent masses.  Cardiovascular: Regular rhythm, S1 normal and S2 normal.    No murmur heard.  Pulmonary/Chest: Effort normal. Lungs clear to auscultation bilaterally.   Abdominal: Soft, non-tender, non-distended. No masses.   Genitourinay/Anorectal:  Defferred  Musculoskeletal: Normal range of motion. Gait normal. No deformity or apparent injury.   Neurological: Pt is alert. Cranial nerves are grossly intact. Moving all extremities without apparent deficit.   Psychiatric: Affect is appropriate. There is no agitation.   Skin: Skin is warm, dry, well perfused. No rash noted. No cyanosis. No pallor. No apparent wound.      ASSESSMENT and PLAN     1. Encounter for completion of form with patient     2. Alcohol use disorder, severe, dependence     3. Acute on chronic pancreatitis     4. Elevated liver enzymes     5. Anemia, macrocytic     6. Electrolyte abnormality     7. Hypomagnesemia     8. Vitamin D deficiency       Patient's ability forms was completed and will be faxed for her  I spoke at length with patient about the importance of abstaining from alcohol use.  She is advised to go to rehab if she is unable to abstain from alcohol use herself  Keep upcoming appointments with your specialists.  Contact information for hematology was given to patient to make an appointment for management of severe anemia which is likely due to her chronic disease      Follow-up in 3 weeks for well woman exam and follow-up of alcohol use disorder

## 2018-12-19 ENCOUNTER — Encounter: Payer: Medicaid - Out of State | Admitting: Internal Medicine

## 2018-12-31 ENCOUNTER — Telehealth: Payer: Self-pay

## 2018-12-31 ENCOUNTER — Other Ambulatory Visit
Admission: RE | Admit: 2018-12-31 | Discharge: 2018-12-31 | Disposition: A | Discharge status: Home / Self Care | Payer: Medicaid Other | Source: Ambulatory Visit | Attending: Physician Assistant | Admitting: Physician Assistant

## 2018-12-31 LAB — VH LABCORP TESTING

## 2018-12-31 NOTE — Telephone Encounter (Signed)
PCP participates in Epic. Updates requested in Care Everywhere.

## 2019-01-07 ENCOUNTER — Other Ambulatory Visit (INDEPENDENT_AMBULATORY_CARE_PROVIDER_SITE_OTHER): Payer: Self-pay | Admitting: Family Medicine

## 2019-01-07 ENCOUNTER — Encounter (INDEPENDENT_AMBULATORY_CARE_PROVIDER_SITE_OTHER): Payer: Self-pay | Admitting: Family Medicine

## 2019-01-07 ENCOUNTER — Ambulatory Visit (INDEPENDENT_AMBULATORY_CARE_PROVIDER_SITE_OTHER): Payer: Medicaid Other | Admitting: Family Medicine

## 2019-01-07 ENCOUNTER — Telehealth (INDEPENDENT_AMBULATORY_CARE_PROVIDER_SITE_OTHER): Payer: Self-pay | Admitting: Family Medicine

## 2019-01-07 VITALS — BP 124/86 | HR 88 | Temp 98.1°F | Resp 16 | Wt 147.0 lb

## 2019-01-07 DIAGNOSIS — F1020 Alcohol dependence, uncomplicated: Secondary | ICD-10-CM

## 2019-01-07 DIAGNOSIS — Z01419 Encounter for gynecological examination (general) (routine) without abnormal findings: Secondary | ICD-10-CM

## 2019-01-07 DIAGNOSIS — E559 Vitamin D deficiency, unspecified: Secondary | ICD-10-CM

## 2019-01-07 DIAGNOSIS — Z1231 Encounter for screening mammogram for malignant neoplasm of breast: Secondary | ICD-10-CM

## 2019-01-07 DIAGNOSIS — J309 Allergic rhinitis, unspecified: Secondary | ICD-10-CM

## 2019-01-07 DIAGNOSIS — K644 Residual hemorrhoidal skin tags: Secondary | ICD-10-CM

## 2019-01-07 MED ORDER — CHOLECALCIFEROL 1.25 MG (50000 UT) PO CAPS
50000.00 [IU] | ORAL_CAPSULE | ORAL | 0 refills | Status: DC
Start: 2019-01-07 — End: 2019-01-17

## 2019-01-07 MED ORDER — ESOMEPRAZOLE MAGNESIUM 40 MG PO CPDR
40.00 mg | DELAYED_RELEASE_CAPSULE | Freq: Every day | ORAL | 1 refills | Status: DC
Start: 2019-01-07 — End: 2019-05-19

## 2019-01-07 MED ORDER — FLUTICASONE PROPIONATE 50 MCG/ACT NA SUSP
1.00 | Freq: Every day | NASAL | 4 refills | Status: DC
Start: 2019-01-07 — End: 2019-05-19

## 2019-01-07 NOTE — Progress Notes (Addendum)
[facility]  [address]  [place], [place], [address]  [phone]  01/07/2019     Patient:   [name]                                                  CSN:        [id number]                                          DOB:       18-Dec-1960                                                    MRN:        [id number]     Chief Complaint   Patient presents with    Gynecologic Exam    Follow-up     3 weeks       SUBJECTIVE     History was provided by the  patient    HPI: Patient is a 58 y.o. female who presents today to for well woman exam, Pap smear and follow-up of her alcohol use disorder    Patient states that she does not recall ever having a Pap smear.  However she may have had one when she was pregnant with her 56 year old son who is currently deceased.    Patient denies any vaginal bleeding, vaginal discharge or any other vaginal problems.  She is not currently sexually active    Patient states that she needs to call and make the appointment for the mammogram    Patient saw GI at [facility] on September 18 and had labs done to further assess her liver function.  Patient's gastroenterologist also wants to do an MRI to further assess for cirrhosis since patient has clinical signs of cirrhosis but her recent CT abdomen was negative for this.  Patient is advised to follow-up with Dr. Arcola Jansky in 6 months    Patient admits to drinking vodka with soda with her roommate two days ago.  However she states that she is significantly decreased the amount of alcohol she drinks.  Patient states that her roommate no longer wants to drink now    Patient states that she is waiting for approval of her long-term disability    Patient reports that her chronic fatigue is improved a little since her last visit.  She requests a refill of vitamin D 50,000 units supplement since  she mistakenly took it daily instead of once a week    Colonoscopy was done 1.5 years ago which was normal with 5 year f/u     got PNA vaccine in [facility] [place] around 2017     Health Maintenance:      Past Medical History:   Diagnosis Date    Ascites     Asthma without status asthmaticus     Clostridium difficile colitis     Disorder of liver     multifocal cystic irregularities    DVT (deep venous thrombosis)     Gastroesophageal reflux disease     H/O ETOH  abuse     sober 2 years until 03/2014    Hypertension     Hypokalemia     Pancreatitis     S/P IVC filter      Past Surgical History:   Procedure Laterality Date    CHOLECYSTECTOMY      COLONOSCOPY  12/26/2012    Procedure: COLONOSCOPY;  Surgeon: Gwenith Spitz, MD;  Location: [facility];  Service: Gastroenterology;  Laterality: N/A;    COLONOSCOPY, POLYPECTOMY  12/26/2012    Procedure: COLONOSCOPY, POLYPECTOMY;  Surgeon: Gwenith Spitz, MD;  Location: [facility];  Service: Gastroenterology;  Laterality: N/A;    DEBRIDEMENT & IRRIGATION, WOUND CLOSURE Right 08/29/2018    Procedure: WASHOUT AND CLOSURE OF TRAUMATIC WOUND, RIGHT FOREARM;  Surgeon: Arnoldo Hooker, MD;  Location: [facility];  Service: General;  Laterality: Right;  Right forearm I&D poss. wound closure    PANCREATECTOMY  01/2011    partial    THORACENTESIS Right 2012    pl effusion     Family History   Adopted: Yes     Social History     Social History Narrative    Not on file     Allergies   Allergen Reactions    Morphine Hallucinations     Outpatient Medications Marked as Taking for the 01/07/19 encounter (Office Visit) with Penny Pia, MD   Medication Sig Dispense Refill    albuterol (PROVENTIL HFA;VENTOLIN HFA) 108 (90 Base) MCG/ACT inhaler Inhale 2 puffs into the lungs every 6 (six) hours as needed for Wheezing      Cholecalciferol 1.25 MG (50000 UT) capsule Take 50,000 Units by mouth once a week 12 capsule 0    folic acid (FOLVITE) 1 MG  tablet Take 1 tablet (1 mg total) by mouth daily 90 tablet 1    furosemide (LASIX) 40 MG tablet Take 1 tablet (40 mg total) by mouth daily as needed (swelling in feet or weight gain) 30 tablet 5    magnesium oxide (MAG-OX) 400 MG tablet Take 2 tablets (800 mg total) by mouth 3 (three) times daily with meals 360 tablet 1    Multiple Vitamins-Minerals (MULTIVITAMIN WITH MINERALS) tablet Take 1 tablet by mouth every morning.         ondansetron (ZOFRAN-ODT) 4 MG disintegrating tablet Take 1 tablet (4 mg total) by mouth every 12 (twelve) hours as needed for Nausea 20 tablet 0    Pancrelipase, Lip-Prot-Amyl, (CREON PO) Take 24,000 Unit by mouth 3 (three) times daily with meals      potassium chloride (KLOR-CON) 20 MEQ tablet Take 1 tablet (20 mEq total) by mouth 3 (three) times daily (Take one tablet by mouth three times a day only on days you take a Lasix) 180 tablet 1    sodium bicarbonate 650 MG tablet Take 1 tablet (650 mg total) by mouth 3 (three) times daily 90 tablet 5    spironolactone (ALDACTONE) 50 MG tablet Take 1 tablet (50 mg total) by mouth daily Pt take one q day 30 tablet 5    thiamine (B-1) 100 MG tablet Take 1 tablet (100 mg total) by mouth daily 30 tablet 5    [DISCONTINUED] Cholecalciferol 1.25 MG (50000 UT) capsule Take 50,000 Units by mouth once a week 12 capsule 0    [DISCONTINUED] pantoprazole (Protonix) 40 MG tablet Take 1 tablet (40 mg total) by mouth every morning before breakfast 90 tablet 1       Review of Systems    Review  of Systems  Review of Systems:Items that patient complains of are in bold. Items that the patient denies are not bolded.  General: Fever, Chills. Chronic fatigue which has improved  Eyes: Discharge  Ears/Nose/Throat:Earache, Congestion due to allergies even though she is taking OTC allergy medications, Sore Throat.   Cardiovascular: Chest Pain, Palpitations, Peripheral Edema.   Respiratory: Cough, Dyspnea.   Gastrointestinal: Nausea, Vomiting, Diarrhea,  Constipation, Abdominal Pain, Melena, Hematochezia.  External hemorrhoid being treated with Anusol rectal cream and suppository  Genitourinary: Dysuria, Hematuria, Discharge or Incontinence.  Musculoskeletal: Joint Pain. Falls  Skin:Rash   Neurologic: Weakness.   Heme/Lymphatic: Abnormal Bruising.      PHYSICAL EXAM   BP 124/86 (BP Site: Left arm, Patient Position: Sitting, Cuff Size: Medium)    Pulse 88    Temp 98.1 F (36.7 C) (Temporal)    Resp 16    Wt 66.7 kg (147 lb)    SpO2 99%    BMI 23.69 kg/m     Physical Exam    Constitutional: Well developed, well nourished, active, in no apparent distress  HENT:   Head: Normocephalic, atraumatic  Ears: No external lesions.  Nose: No external lesions. No epistaxis or drainage.  Eyes: PERRL. No scleral icterus. No conjunctival injection. EOMI.  Neck: Trachea is midline. No JVD. Normal range of motion. No apparent masses.  Cardiovascular: Regular rhythm, S1 normal and S2 normal.    No murmur heard.  Breast: declined by pt  Pulmonary/Chest: Effort normal. Lungs clear to auscultation bilaterally.   Abdominal: Soft, non-tender, non-distended. No masses.   Genitourinay/Anorectal: chaparone in room, noninflamed nontender external hemorrhoid is noted  Pelvic exam: normal external genitalia, vulva, vagina, cervix, uterus and adnexa, VULVA: normal appearing vulva with no masses, tenderness or lesions, VAGINA: normal appearing vagina with normal color and discharge, no lesions, CERVIX: normal appearing cervix without discharge or lesions, PAP: Pap smear done today, DNA probe for chlamydia and GC obtained, exam chaperoned by [name], LPN.  Musculoskeletal: Normal range of motion. Gait normal. No deformity or apparent injury.   Neurological: Pt is alert. Cranial nerves are grossly intact. Moving all extremities without apparent deficit.   Psychiatric: Affect is appropriate. There is no agitation.   Skin: Skin is warm, dry, well perfused. No rash noted. No cyanosis. No pallor.  No apparent wound.      LABS     Lab Results   Component Value Date    CHOL 172 10/15/2018    HDL 70 (H) 10/15/2018    TRIG 97 10/15/2018    NA 134 12/05/2018    K 4.0 12/05/2018    BUN 11 12/05/2018    GLU 86 12/05/2018    CA 9.1 12/05/2018    AST 104 (H) 12/05/2018    ALT 43 (H) 12/05/2018    ALKPHOS 174 (H) 12/05/2018    TSH 0.754 12/05/2018         UPDATED IMMUNIZATIONS     Immunization History   Administered Date(s) Administered    Influenza quadrivalent (IM) PF 3 Yrs & greater 12/26/2017    Tdap 02/28/2018         ASSESSMENT and PLAN     1. Well woman exam with routine gynecological exam  PAP, Liquid Based    STD Amplified DNA Probe   2. Alcohol use disorder, severe, dependence  esomeprazole (NexIUM) 40 MG capsule   3. Allergic rhinitis, unspecified seasonality, unspecified trigger  fluticasone (Flonase) 50 MCG/ACT nasal spray   4. Vitamin D deficiency  Cholecalciferol 1.25 MG (50000 UT) capsule   5. External hemorrhoids     6. Breast cancer screening by mammogram     Pap with HPV testing was done today  I spoke at length with patient about the importance of abstaining from alcohol use.  She is advised to go to rehab if she is unable to abstain from alcohol use herself  Patient is encouraged to get the flu vaccine  Make an appointment for mammogram  Follow-up in 6 weeks for follow-up of alcohol use disorder.  We will follow-up to see patient made the appointment with hematology at her next appointment  Patient is encouraged to get the flu vaccine    Penny Pia, MD

## 2019-01-07 NOTE — Patient Instructions (Signed)
Alcohol Addiction    Does your drinking harm yourself or others? Or has it led to other problems with your daily life? If so, you may be addicted to alcohol.  You may have what's called an alcohol use disorder. Your healthcare provider may make this diagnosis if you have had at least 2 of these problems in a year:   You drink alcohol in larger amounts or for a longer period than you planned.   You often want to cut down or control how much you drink. Or you have often failed to do so.   You spend a lot of time getting alcohol, using it, or recovering from its use.   You crave or have a strong desire or urge to drink.   Your drinking makes it hard for you to be responsible at work, school, or home.   You keep on drinking even though you have had problems in relationships or social settings because of it.   You give up or miss important social, work, or other activities because of your drinking.   You drink alcohol at times when it's not physically safe, such as drinking then driving.   You keep on drinking even though you know it has caused physical or emotional problems.   You need more and more alcohol to get the same effects.   You hide how much you drink from family and friends.   You have withdrawal symptoms or use alcohol to avoid such symptoms.  [employer]Well last reviewed this educational content on 05/12/2015   2000-2020 [employer] [address], [place] [address]. All rights reserved. This information is not intended as a substitute for professional medical care. Always follow your healthcare professional's instructions.

## 2019-01-07 NOTE — Telephone Encounter (Signed)
Spoke to pt aware will contact GI in AM  L. Willette Mudry,LPN

## 2019-01-09 LAB — CHLAMYDIA GONORRHOEAE NAA
CHLAMYDIA TRACHOMATIS, NAA: NEGATIVE
CHLAMYDIA TRACHOMATIS, NAA: NEGATIVE
Neisseria gonorrhoeae, NAA: NEGATIVE
Neisseria gonorrhoeae, NAA: NEGATIVE

## 2019-01-13 ENCOUNTER — Ambulatory Visit: Payer: Self-pay | Admitting: Internal Medicine

## 2019-01-13 DIAGNOSIS — I10 Essential (primary) hypertension: Secondary | ICD-10-CM | POA: Insufficient documentation

## 2019-01-13 DIAGNOSIS — J45909 Unspecified asthma, uncomplicated: Secondary | ICD-10-CM | POA: Insufficient documentation

## 2019-01-13 DIAGNOSIS — R0602 Shortness of breath: Secondary | ICD-10-CM | POA: Insufficient documentation

## 2019-01-13 DIAGNOSIS — R079 Chest pain, unspecified: Secondary | ICD-10-CM | POA: Insufficient documentation

## 2019-01-13 NOTE — Progress Notes (Deleted)
NO SHOW

## 2019-01-14 ENCOUNTER — Other Ambulatory Visit (INDEPENDENT_AMBULATORY_CARE_PROVIDER_SITE_OTHER): Payer: Self-pay | Admitting: Family Medicine

## 2019-01-14 DIAGNOSIS — E559 Vitamin D deficiency, unspecified: Secondary | ICD-10-CM

## 2019-01-15 LAB — PAP WITH HR HPV
.: 0
HPV, high-risk: NEGATIVE

## 2019-01-20 ENCOUNTER — Ambulatory Visit (INDEPENDENT_AMBULATORY_CARE_PROVIDER_SITE_OTHER): Payer: Medicaid Other

## 2019-01-27 ENCOUNTER — Telehealth (INDEPENDENT_AMBULATORY_CARE_PROVIDER_SITE_OTHER): Payer: Self-pay | Admitting: Family Medicine

## 2019-01-27 DIAGNOSIS — Z23 Encounter for immunization: Secondary | ICD-10-CM

## 2019-01-27 NOTE — Telephone Encounter (Signed)
G.I specialist @ [facility] wants patient to have Hep A and B vaccines. He told her to contact her PCP for these. Would you place the orders? Thank you!    -Alphonzo Lemmings

## 2019-02-18 ENCOUNTER — Other Ambulatory Visit (INDEPENDENT_AMBULATORY_CARE_PROVIDER_SITE_OTHER): Payer: Self-pay

## 2019-02-18 ENCOUNTER — Ambulatory Visit (INDEPENDENT_AMBULATORY_CARE_PROVIDER_SITE_OTHER): Payer: Medicaid Other | Admitting: Family Medicine

## 2019-02-18 ENCOUNTER — Encounter (INDEPENDENT_AMBULATORY_CARE_PROVIDER_SITE_OTHER): Payer: Self-pay | Admitting: Family Medicine

## 2019-02-18 VITALS — BP 142/110 | HR 96 | Temp 96.5°F | Resp 16 | Wt 149.6 lb

## 2019-02-18 DIAGNOSIS — F1020 Alcohol dependence, uncomplicated: Secondary | ICD-10-CM

## 2019-02-18 DIAGNOSIS — Z122 Encounter for screening for malignant neoplasm of respiratory organs: Secondary | ICD-10-CM

## 2019-02-18 DIAGNOSIS — I10 Essential (primary) hypertension: Secondary | ICD-10-CM

## 2019-02-18 DIAGNOSIS — Z1231 Encounter for screening mammogram for malignant neoplasm of breast: Secondary | ICD-10-CM

## 2019-02-18 DIAGNOSIS — F17200 Nicotine dependence, unspecified, uncomplicated: Secondary | ICD-10-CM

## 2019-02-18 DIAGNOSIS — K860 Alcohol-induced chronic pancreatitis: Secondary | ICD-10-CM

## 2019-02-18 DIAGNOSIS — E559 Vitamin D deficiency, unspecified: Secondary | ICD-10-CM

## 2019-02-18 MED ORDER — VITAMIN D3 1.25 MG (50000 UT) PO CAPS
1.00 | ORAL_CAPSULE | ORAL | 0 refills | Status: DC
Start: 2019-02-18 — End: 2019-08-01

## 2019-02-18 NOTE — Patient Instructions (Signed)
Controlling High Blood Pressure  High blood pressure (hypertension) is often called the silent killer. This is because many people who have it, don’t know it. It can be very dangerous. High blood pressure can raise your risk of heart attack, stroke, heart disease, and heart failure. Controlling your blood pressure can decrease your risk of these problems. It's important to know the appropriate blood pressure range and remember to check your blood pressure regularly. Doing so can save your life.  Blood pressure measurements are given as 2 numbers. Systolic blood pressure is the upper number. This is the pressure when the heart contracts. Diastolic blood pressure is the lower number. This is the pressure when the heart relaxes between beats.  Blood pressure is categorized as normal, elevated, or stage 1 or stage 2 high blood pressure:  · Normal blood pressure is systolic of less than 120 and diastolic of less than 80 (120/80)  · Elevated blood pressure is systolic of 120 to 129 and diastolic less than 80  · Stage 1 high blood pressure is systolic of 130 to 139 or diastolic between 80 to 89  · Stage 2 high blood pressure is when systolic is 140 or higher or the diastolic is 90 or higher  A heart-healthy lifestyle can help you control your blood pressure without medicines. Here are some things you can do to pursue a heart-healthy lifestyle:    Choose heart-healthy foods  · Select low-salt, low-fat foods. Limit sodium intake to 2,400 mg per day or the amount suggested by your healthcare provider.  · Limit canned, dried, cured, packaged, and fast foods. These can contain a lot of salt.  · Eat 8 to 10 servings of fruits and vegetables every day.  · Choose lean meats, fish, or chicken.  · Eat whole-grain pasta, brown rice, and beans.  · Eat 2 to 3 servings of low-fat or fat-free dairy products.  · Ask your doctor about the DASH eating plan. This plan helps reduce blood pressure.  · When you go to a restaurant, ask that  your meal be prepared with no added salt.    Stay at a healthy weight  · Ask your healthcare provider how many calories to eat a day. Then stick to that number.  · Ask your healthcare provider what weight range is healthiest for you. If you are overweight, a weight loss of only 3% to 5% of your body weight can help lower blood pressure. Generally, a good weight loss goal is to lose 10% of your body weight in a year.  · Limit snacks and sweets.  · Get regular exercise.    Get up and get active  · Find activities you enjoy that can be done alone or with friends or family. Such activities might include bicycling, dancing, walking, or jogging.  · Park farther away from building entrances to walk more.  · Use stairs instead of the elevator.  · When you can, walk or bike instead of driving.  · Rake leaves, garden, or do household repairs.  · Be active at a moderate to vigorous level of physical activity for at least 40 minutes for a minimum of 3 to 4 days a week.     Manage stress  · Make time to relax and enjoy life. Find time to laugh.  · Communicate your concerns with your loved ones and your healthcare provider.  · Visit with family and friends, and keep up with hobbies.    Limit alcohol and quit smoking  ·   Men should have no more than 2 drinks per day.  · Women should have no more than 1 drink per day.  · Talk with your healthcare provider about quitting smoking. Smoking significantly increases your risk for heart disease and stroke. Ask your healthcare provider about community smoking cessation programs and other options.    Medicines  If lifestyle changes aren’t enough, your healthcare provider may prescribe high blood pressure medicine. Take all medicines as prescribed. If you have any questions about your medicines, ask your healthcare provider before stopping or changing them.  [employer] last reviewed this educational content on 09/08/2017  © 2000-2020 [employer] [address], [place]  [address]. All rights reserved. This information is not intended as a substitute for professional medical care. Always follow your healthcare professional's instructions.

## 2019-02-18 NOTE — Progress Notes (Addendum)
[facility]  [address]  [place], [place], [address]  [phone]  02/18/2019     Patient:   [name]                                                  CSN:        [id number]                                          DOB:       Nov 27, 1960                                                    MRN:        [id number]       Chief Complaint   Patient presents with    Follow-up     6 wk      SUBJECTIVE     History was provided by the  patient    HPI: Patient is a 58 y.o. female who presents today for a six week follow-up on chronic problems.  She also wants to discuss the results of her pap smear    She is having trouble with getting social security benefits on time. As a result, she was not able to get  MRI abdomen. She is hopeful the issue will be resolved soon and so that she can get her test done in December.     Hypertension    This is a chronic problem. The current episode started more than 1 year ago. The problem is unchanged. Pertinent negatives include no blurred vision, chest pain, headaches, orthopnea, palpitations, peripheral edema or shortness of breath. Risk factors for coronary artery disease include smoking/tobacco exposure and post-menopausal state. Past treatments include beta blockers and diuretics. There are no compliance problems.  There is no history of angina.     Per patient she was having recurrent episodes of dizziness and syncope, as a result she was asked to discontinue her BP medication (Amlodipine) by [facility]. She states that her blood pressure is usually elevate when she is in pain. She denies any pain today.     She is compliant with Spironolactone 50 mg PO once daily. She takes Furosemide 40mg  tablet PRN for leg swelling.    Patient is on the path of quitting alcohol. States she has not had a drink since last visit. Denies any abdomnial  pain, floating stools, melena or blood in stool. She continues to take 24,000 units of Creon three times daily. She is down to smoking one cigarettes a day    Repeat BP: 160/100.    Past Medical History:   Diagnosis Date    Ascites     Asthma without status asthmaticus     Clostridium difficile colitis     Disorder of liver     multifocal cystic irregularities    DVT (deep venous thrombosis)     Gastroesophageal reflux disease     H/O ETOH abuse     sober 2 years until 03/2014    Hypertension     Hypokalemia  Pancreatitis     S/P IVC filter      Past Surgical History:   Procedure Laterality Date    CHOLECYSTECTOMY      COLONOSCOPY  12/26/2012    Procedure: COLONOSCOPY;  Surgeon: Gwenith Spitz, MD;  Location: [facility];  Service: Gastroenterology;  Laterality: N/A;    COLONOSCOPY, POLYPECTOMY  12/26/2012    Procedure: COLONOSCOPY, POLYPECTOMY;  Surgeon: Gwenith Spitz, MD;  Location: [facility];  Service: Gastroenterology;  Laterality: N/A;    DEBRIDEMENT & IRRIGATION, WOUND CLOSURE Right 08/29/2018    Procedure: WASHOUT AND CLOSURE OF TRAUMATIC WOUND, RIGHT FOREARM;  Surgeon: Arnoldo Hooker, MD;  Location: [facility];  Service: General;  Laterality: Right;  Right forearm I&D poss. wound closure    PANCREATECTOMY  01/2011    partial    THORACENTESIS Right 2012    pl effusion     Family History   Adopted: Yes     Social History     Social History Narrative    Not on file     Allergies   Allergen Reactions    Morphine Hallucinations     Outpatient Medications Marked as Taking for the 02/18/19 encounter (Office Visit) with Penny Pia, MD   Medication Sig Dispense Refill    albuterol (PROVENTIL HFA;VENTOLIN HFA) 108 (90 Base) MCG/ACT inhaler Inhale 2 puffs into the lungs every 6 (six) hours as needed for Wheezing      esomeprazole (NexIUM) 40 MG capsule Take 1 capsule (40 mg total) by mouth daily 90 capsule 1    fluticasone (Flonase) 50 MCG/ACT nasal spray 1 spray by  Nasal route daily 1 Bottle 4    folic acid (FOLVITE) 1 MG tablet Take 1 tablet (1 mg total) by mouth daily 90 tablet 1    furosemide (LASIX) 40 MG tablet Take 1 tablet (40 mg total) by mouth daily as needed (swelling in feet or weight gain) 30 tablet 5    KLOR-CON M 20 MEQ tablet TAKE 1 TABLET BY MOUTH THREE TIMES DAILY ONLY ON DAYS YOU TAKE LASIX 270 tablet 1    magnesium oxide (MAG-OX) 400 MG tablet Take 2 tablets (800 mg total) by mouth 3 (three) times daily with meals 360 tablet 1    Multiple Vitamins-Minerals (MULTIVITAMIN WITH MINERALS) tablet Take 1 tablet by mouth every morning.         ondansetron (ZOFRAN-ODT) 4 MG disintegrating tablet Take 1 tablet (4 mg total) by mouth every 12 (twelve) hours as needed for Nausea 20 tablet 0    Pancrelipase, Lip-Prot-Amyl, (CREON PO) Take 24,000 Unit by mouth 3 (three) times daily with meals      sodium bicarbonate 650 MG tablet Take 1 tablet (650 mg total) by mouth 3 (three) times daily 90 tablet 5    spironolactone (ALDACTONE) 50 MG tablet Take 1 tablet (50 mg total) by mouth daily Pt take one q day 30 tablet 5    thiamine (B-1) 100 MG tablet Take 1 tablet (100 mg total) by mouth daily 30 tablet 5    [DISCONTINUED] Cholecalciferol (Vitamin D3) 1.25 MG (50000 UT) Cap TAKE 1 CAPSULE BY MOUTH ONCE A WEEK 12 capsule 0       Review of Systems   Constitutional: Negative for chills, fatigue, fever and unexpected weight change.   HENT: Negative for congestion, ear pain, rhinorrhea, sinus pressure, sinus pain and trouble swallowing.    Eyes: Negative for blurred vision, pain and discharge.   Respiratory: Negative for chest tightness,  shortness of breath and wheezing.    Cardiovascular: Negative for chest pain, palpitations, orthopnea and leg swelling.   Gastrointestinal: Negative for abdominal pain, blood in stool, constipation, diarrhea, nausea and vomiting.   Endocrine: Negative.    Genitourinary: Negative for difficulty urinating, dysuria, flank pain, frequency,  hematuria, urgency and vaginal discharge.   Musculoskeletal: Negative for arthralgias, back pain and myalgias.   Skin: Positive for wound. Negative for pallor and rash.        Healing wound with scar tissue on scalp   Neurological: Negative for syncope, weakness, numbness and headaches.   Hematological: Negative.    Psychiatric/Behavioral: Negative for agitation, decreased concentration and sleep disturbance. The patient is not nervous/anxious.          PHYSICAL EXAM   BP (!) 142/110 (BP Site: Right arm, Patient Position: Sitting, Cuff Size: Medium)    Pulse 96    Temp (!) 96.5 F (35.8 C) (Temporal)    Resp 16    Wt 67.9 kg (149 lb 9.6 oz)    SpO2 96%    BMI 24.11 kg/m     Physical Exam    Constitutional: Well developed, well nourished, active, in no apparent distress.  HENT:   Head: Normocephalic, atraumatic  Ears: No external lesions.  Nose: No external lesions. No epistaxis or drainage.  Eyes: PERRL. No scleral icterus. No conjunctival injection. EOMI.  Neck: Trachea is midline. No JVD. Normal range of motion. No apparent masses.  Cardiovascular: Regular rhythm, S1 normal and S2 normal.    No murmur heard.  Pulmonary/Chest: Effort normal. Lungs clear to auscultation bilaterally.   Abdominal: Soft, non-tender, non-distended. No masses.   Genitourinay/Anorectal: Defferred  Musculoskeletal: Normal range of motion. Gait normal. No deformity or apparent injury.   Neurological: Pt is alert. Cranial nerves are grossly intact. Moving all extremities without apparent deficit.   Psychiatric: Affect is appropriate. There is no agitation.   Skin: Skin is warm, dry, well perfused. No rash noted. No cyanosis. No pallor. No apparent wound.      LABS     Lab Results   Component Value Date    CHOL 172 10/15/2018    HDL 70 (H) 10/15/2018    TRIG 97 10/15/2018    NA 134 12/05/2018    K 4.0 12/05/2018    BUN 11 12/05/2018    GLU 86 12/05/2018    CA 9.1 12/05/2018    AST 104 (H) 12/05/2018    ALT 43 (H) 12/05/2018    ALKPHOS 174  (H) 12/05/2018    TSH 0.754 12/05/2018         UPDATED IMMUNIZATIONS     Immunization History   Administered Date(s) Administered    Influenza quad 6 MOS to 64 YRS (Flulaval/Fluarix) 01/29/2019    Influenza quadrivalent (IM) PF 3 Yrs & greater 12/26/2017    Tdap 02/28/2018    Zoster [facility]) Vaccine Recombinant 02/07/2019         ASSESSMENT and PLAN     1. Essential hypertension     2. Alcohol use disorder, severe, dependence     3. Chronic alcoholic pancreatitis     4. Vitamin D deficiency     5. Encounter for screening for lung cancer     6. Breast cancer screening by mammogram     7. Tobacco use disorder           Encouraged patient to monitor blood pressure at home and bring logs at next visit. Elevated blood pressure readings  in clinic. Past readings in the clinic have been under the goal of 140/90. We will consider adding another antihypertensive agent at next visit pending ambulatory BP readings.    Congratulated patient on being alcohol free for six weeks. Patient is motivated to continue on the same path. Encouraged patient to ask questions or reach out for help if needed.     We discussed appropriate dosing of Vit D. 1.25mg  capsule is once weekly.     Advised patient to continue follow-up with [facility].  Contact information was given to pt to schedule low dose Ct chest for lung cancer screening and for a screening mammogram    Follow up in 4 weeks for hypertension. Earlier if symptoms worsen or new issues arise.     Penny Pia, MD

## 2019-03-16 ENCOUNTER — Other Ambulatory Visit (INDEPENDENT_AMBULATORY_CARE_PROVIDER_SITE_OTHER): Payer: Self-pay | Admitting: Family

## 2019-03-16 DIAGNOSIS — I10 Essential (primary) hypertension: Secondary | ICD-10-CM

## 2019-03-16 DIAGNOSIS — Z760 Encounter for issue of repeat prescription: Secondary | ICD-10-CM

## 2019-03-18 ENCOUNTER — Emergency Department
Admission: EM | Admit: 2019-03-18 | Discharge: 2019-03-18 | Disposition: A | Discharge status: Home / Self Care | Payer: 59 | Attending: Emergency Medicine | Admitting: Emergency Medicine

## 2019-03-18 ENCOUNTER — Other Ambulatory Visit (INDEPENDENT_AMBULATORY_CARE_PROVIDER_SITE_OTHER): Payer: Self-pay

## 2019-03-18 ENCOUNTER — Ambulatory Visit (INDEPENDENT_AMBULATORY_CARE_PROVIDER_SITE_OTHER): Payer: 59 | Admitting: Family Medicine

## 2019-03-18 ENCOUNTER — Encounter (INDEPENDENT_AMBULATORY_CARE_PROVIDER_SITE_OTHER): Payer: Self-pay | Admitting: Family Medicine

## 2019-03-18 ENCOUNTER — Emergency Department: Payer: 59

## 2019-03-18 VITALS — BP 152/100 | HR 60 | Temp 97.1°F | Wt 161.0 lb

## 2019-03-18 DIAGNOSIS — S49102A Unspecified physeal fracture of lower end of humerus, left arm, initial encounter for closed fracture: Secondary | ICD-10-CM

## 2019-03-18 DIAGNOSIS — M25522 Pain in left elbow: Secondary | ICD-10-CM

## 2019-03-18 DIAGNOSIS — W109XXA Fall (on) (from) unspecified stairs and steps, initial encounter: Secondary | ICD-10-CM | POA: Insufficient documentation

## 2019-03-18 DIAGNOSIS — F17200 Nicotine dependence, unspecified, uncomplicated: Secondary | ICD-10-CM

## 2019-03-18 DIAGNOSIS — F1020 Alcohol dependence, uncomplicated: Secondary | ICD-10-CM

## 2019-03-18 DIAGNOSIS — W19XXXA Unspecified fall, initial encounter: Secondary | ICD-10-CM

## 2019-03-18 DIAGNOSIS — J45909 Unspecified asthma, uncomplicated: Secondary | ICD-10-CM

## 2019-03-18 DIAGNOSIS — I10 Essential (primary) hypertension: Secondary | ICD-10-CM

## 2019-03-18 DIAGNOSIS — Z23 Encounter for immunization: Secondary | ICD-10-CM

## 2019-03-18 MED ORDER — SODIUM BICARBONATE 650 MG PO TABS
650.0000 mg | ORAL_TABLET | Freq: Three times a day (TID) | ORAL | 5 refills | Status: DC
Start: 2019-03-18 — End: 2020-07-21

## 2019-03-18 MED ORDER — SPIRONOLACTONE 50 MG PO TABS
50.0000 mg | ORAL_TABLET | Freq: Every day | ORAL | 5 refills | Status: DC
Start: 2019-03-18 — End: 2019-05-19

## 2019-03-18 MED ORDER — OXYCODONE-ACETAMINOPHEN 5-325 MG PO TABS
ORAL_TABLET | ORAL | Status: AC
Start: 2019-03-18 — End: ?
  Filled 2019-03-18: qty 1

## 2019-03-18 MED ORDER — LISINOPRIL 5 MG PO TABS
5.0000 mg | ORAL_TABLET | Freq: Every day | ORAL | 2 refills | Status: DC
Start: 2019-03-18 — End: 2019-05-19

## 2019-03-18 MED ORDER — OXYCODONE-ACETAMINOPHEN 5-325 MG PO TABS
1.0000 | ORAL_TABLET | Freq: Four times a day (QID) | ORAL | 0 refills | Status: DC | PRN
Start: 2019-03-18 — End: 2019-04-08

## 2019-03-18 MED ORDER — NALOXONE HCL 4 MG/0.1ML NA LIQD
NASAL | 0 refills | Status: DC
Start: 2019-03-18 — End: 2019-05-19

## 2019-03-18 MED ORDER — OXYCODONE-ACETAMINOPHEN 5-325 MG PO TABS
2.00 | ORAL_TABLET | Freq: Once | ORAL | Status: AC
Start: 2019-03-18 — End: 2019-03-18
  Administered 2019-03-18: 15:00:00 2 via ORAL

## 2019-03-18 NOTE — ED Notes (Signed)
X-ray at bedside

## 2019-03-18 NOTE — ED Notes (Signed)
Pt ambulatory from triage with steady gait/no acute distress noted.

## 2019-03-18 NOTE — ED Triage Notes (Signed)
Pt here with left elbow pain. Pt states that she tripped on Saturday and landed on her left elbow. Pt was seen at there PCP today and sent here to be evaluated for concern of infection.+swelling and +warmth noted. Pt states that the pain has increased since incident on Saturday. Per pt she is unable to straighten out her left arm. Pt is able to move fingers +pulse palpable. Denies any other complaints.

## 2019-03-18 NOTE — Patient Instructions (Signed)
FallPrevention  Falls often occur due to slipping, tripping or losing your balance. Millions of people fall every year and injure themselves.Here are ways to reduce your risk of falling again.    Think about your fall, was there anything that caused your fall that can be fixed, removed, or replaced?   Make your home safe by keeping walkways clear of objects you may trip over, such as electric cords.   Use non-slip pads under rugs. Don't use area rugs or small throw rugs.   Use non-slip mats in bathtubs and showers.   Install handrails and lights on staircases. The handrails should be on both sides of the stairs.   Don't walk in poorly lit areas.   Don't stand on chairs or wobbly ladders.   Use caution when reaching overhead or looking upward.This position can cause a loss of balance.   Be sure your shoes fit properly, have non-slip bottoms and are in good condition.   Wear shoes both inside and out. Don't go barefoot or wear slippers.   Be cautious when going up and down stairs, curbs, and when walking on uneven sidewalks.   If your balance is poor, consider using a cane or walker.   If your fall was related to alcohol use, stop or limit alcohol intake.   If your fall was related to use of sleeping medicines, talk to your healthcare provider about this.You may need to reduce your dosage at bedtime if you awaken during the night to go to the bathroom.   To reduce the need for nighttime bathroom trips:  ? Don't drink fluids for several hours before going to bed  ? Empty your bladder before going to bed  ? Men can keep a urinal at the bedside   Stay as active as you can. Balance, flexibility, strength, and endurance all come from exercise. They all play a role in preventing falls. Ask your healthcare provider which types of activity are right for you.   Get your vision checked on a regular basis.   If you have pets, know where they are before you stand up or walk so you don't trip over them.    Use night lights.   Go over all your medicines with a pharmacist or other healthcare provider to see if any of them could make you more likely to fall.  [employer] last reviewed this educational content on 07/09/2016   2000-2020 [employer] [address], [place] [address]. All rights reserved. This information is not intended as a substitute for professional medical care. Always follow your healthcare professional's instructions.

## 2019-03-18 NOTE — ED Notes (Signed)
Dr. Brooke Dare to bedside to splint pt.

## 2019-03-18 NOTE — ED Provider Notes (Signed)
History     Chief Complaint   Patient presents with    Joint Swelling     HPI     Patient in for evaluation of joint swelling.  Patient has had pain and swelling of her left elbow since falling down a few  Stairs 4 days ago.  She states she did not seek any treatment because it looks like the hospital was too busy at that time.  Was advised after contact her PCP to come to the hospital for evaluation.  She has no fevers or chills just swelling and redness of her left elbow with some pain difficulty straightening it she can straighten it out very well.  She did have an elbow injury as a child she states with a dislocation relocation which apparently left her with some sort of deficit as it did not heal properly.  She also has a bump on her left elbow.  Otherwise she has no other injuries no head injury no neck injury no other extremity injury.    Past Medical History:   Diagnosis Date    Ascites     Asthma without status asthmaticus     Clostridium difficile colitis     Disorder of liver     multifocal cystic irregularities    DVT (deep venous thrombosis)     Gastroesophageal reflux disease     H/O ETOH abuse     sober 2 years until 03/2014    Hypertension     Hypokalemia     Pancreatitis     S/P IVC filter        Past Surgical History:   Procedure Laterality Date    CHOLECYSTECTOMY      COLONOSCOPY  12/26/2012    Procedure: COLONOSCOPY;  Surgeon: Gwenith Spitz, MD;  Location: [facility];  Service: Gastroenterology;  Laterality: N/A;    COLONOSCOPY, POLYPECTOMY  12/26/2012    Procedure: COLONOSCOPY, POLYPECTOMY;  Surgeon: Gwenith Spitz, MD;  Location: [facility];  Service: Gastroenterology;  Laterality: N/A;    DEBRIDEMENT & IRRIGATION, WOUND CLOSURE Right 08/29/2018    Procedure: WASHOUT AND CLOSURE OF TRAUMATIC WOUND, RIGHT FOREARM;  Surgeon: Arnoldo Hooker, MD;  Location: [facility];  Service: General;  Laterality: Right;  Right forearm I&D poss. wound closure     PANCREATECTOMY  01/2011    partial    THORACENTESIS Right 2012    pl effusion       Family History   Adopted: Yes       Social  Social History     Tobacco Use    Smoking status: Current Every Day Smoker     Packs/day: 0.25     Years: 30.00     Pack years: 7.50     Types: Cigarettes    Smokeless tobacco: Never Used    Tobacco comment: 1 pack every 3 days   Substance Use Topics    Alcohol use: Yes     Comment: 4-5 mixed drinks per day    Drug use: No       .     Allergies   Allergen Reactions    Norvasc [Amlodipine] Other (See Comments)     Unsteady on feet    Morphine Hallucinations       Home Medications     Med List Status: In Progress Set By: Lance Muss, RN at 03/18/2019  2:39 PM                albuterol (PROVENTIL HFA;VENTOLIN  HFA) 108 (90 Base) MCG/ACT inhaler     Inhale 2 puffs into the lungs every 6 (six) hours as needed for Wheezing     Cholecalciferol (Vitamin D3) 1.25 MG (50000 UT) Cap     Take 1 capsule by mouth once a week     esomeprazole (NexIUM) 40 MG capsule     Take 1 capsule (40 mg total) by mouth daily     fluticasone (Flonase) 50 MCG/ACT nasal spray     1 spray by Nasal route daily     folic acid (FOLVITE) 1 MG tablet     Take 1 tablet (1 mg total) by mouth daily     furosemide (LASIX) 40 MG tablet     Take 1 tablet (40 mg total) by mouth daily as needed (swelling in feet or weight gain)     KLOR-CON M 20 MEQ tablet     TAKE 1 TABLET BY MOUTH THREE TIMES DAILY ONLY ON DAYS YOU TAKE LASIX     lisinopril (ZESTRIL) 5 MG tablet     Take 1 tablet (5 mg total) by mouth daily     magnesium oxide (MAG-OX) 400 MG tablet     Take 2 tablets (800 mg total) by mouth 3 (three) times daily with meals     Multiple Vitamins-Minerals (MULTIVITAMIN WITH MINERALS) tablet     Take 1 tablet by mouth every morning.        ondansetron (ZOFRAN-ODT) 4 MG disintegrating tablet     Take 1 tablet (4 mg total) by mouth every 12 (twelve) hours as needed for Nausea     Pancrelipase, Lip-Prot-Amyl, (CREON PO)      Take 24,000 Unit by mouth 3 (three) times daily with meals     sodium bicarbonate 650 MG tablet     Take 1 tablet (650 mg total) by mouth 3 (three) times daily     spironolactone (ALDACTONE) 50 MG tablet     Take 1 tablet (50 mg total) by mouth daily Pt take one q day     thiamine (B-1) 100 MG tablet     Take 1 tablet (100 mg total) by mouth daily                               Review of Systems   Constitutional: Negative for chills, fatigue and fever.   Respiratory: Negative for chest tightness and shortness of breath.    Cardiovascular: Negative for chest pain.   Musculoskeletal: Negative for gait problem and neck pain.   Skin: Positive for color change. Negative for pallor, rash and wound.   Neurological: Negative for weakness and numbness.   Hematological: Negative for adenopathy. Does not bruise/bleed easily.   Psychiatric/Behavioral: Negative for behavioral problems.       Physical Exam    BP: (!) 148/106, Heart Rate: 96, Temp: 97.5 F (36.4 C), Resp Rate: 18, SpO2: 96 %, Weight: 71.4 kg    Physical Exam  Vitals signs and nursing note reviewed.   HENT:      Head: Normocephalic and atraumatic.   Eyes:      Extraocular Movements: Extraocular movements intact.   Neck:      Musculoskeletal: Normal range of motion. No muscular tenderness.   Pulmonary:      Effort: Pulmonary effort is normal.   Musculoskeletal:         General: Swelling, tenderness and signs of injury present.  Arms:    Skin:     General: Skin is warm.      Capillary Refill: Capillary refill takes less than 2 seconds.      Findings: Bruising and erythema present.   Neurological:      General: No focal deficit present.      Mental Status: She is alert and oriented to person, place, and time.      Sensory: No sensory deficit.      Motor: No weakness.   Psychiatric:         Mood and Affect: Mood normal.           MDM and ED Course     ED Medication Orders (From admission, onward)    Start Ordered     Status Ordering Provider    03/18/19 1456  03/18/19 1455  oxyCODONE-acetaminophen (PERCOCET) 5-325 MG per tablet 2 tablet  Once in ED     Route: Oral  Ordered Dose: 2 tablet     Last MAR action: Given Edi Gorniak K         Results for orders placed or performed during the hospital encounter of 03/18/19   XR Elbow Left AP Lateral And Obliques    Narrative    Clinical History:  pain    +    +.    Patient here with left elbow pain. Patient states that she tripped on Saturday and landed on her left elbow. Patient was seen at there PCP today and sent here to be evaluated for concern of infection.Swelling and warmth noted. Patient states that the   pain has increased since incident on Saturday. Per patient she is unable to straighten out her left arm. Patient is able to move fingers, pulse palpable. Patient states that she dislocated her left elbow when she was a little girl.    .+     Examination:  XR ELBOW LEFT AP LATERAL AND OBLIQUES    Comparison:  None available.    Findings:  There is a displaced fracture supracondylar distal humerus. Also a fracture of the radial head. Large joint effusion.      Impression    Transverse fracture distal humeral metaphysis through the supracondylar region. Fracture radial head.    ReadingStation:[id number]           MDM           This patient presents to the Emergency Department following trauma and sustained in injury to an extremity.   Evaluation and treatment for this patient was performed and revealed that there were no serious injuries identified in the ED, and no threat of loss of limb.  Sequelae of their injury were considered in the differential diagnosis including sprain, fracture, dislocation, head/spine injury, contusion, abrasion, and laceration. Any serious sequelae that would require admission and immediate surgical repair were thought unlikely, and the patient is stable for discharge home.  The diagnostic impression and plan and appropriate follow-up were discussed and agreed upon with the patient and/or family.   If performed the results of lab/radiology tests were reviewed and discussed, wrap was applied, and crutch training performed.  All questions were answered and concerns addressed.  Fall/trauma/sports precautions have been given and the patient was warned to return immediately for worsening symptoms or any acute concerns and to follow up with an orthopaedic specialist within an appropriate time as discussed.        ProceduresIndication fractured elbow, patient was placed in a long arm fiberglass splint with multiple layers  of padding, neurovascular intact, sling    Clinical Impression & Disposition     Clinical Impression  Final diagnoses:   Nondisplaced physeal fracture of distal end of left humerus, initial encounter        ED Disposition     ED Disposition Condition Date/Time Comment    Discharge  Tue Mar 18, 2019  4:47 PM [name] discharge to home/self care.    Condition at disposition: Stable             Discharge Medication List as of 03/18/2019  4:47 PM      START taking these medications    Details   naloxone (NARCAN) 4 MG/0.1ML nasal spray 1 spray intranasally. If pt does not respond or relapses into respiratory depression call 911. Give additional doses every 2-3 min., E-Rx      oxyCODONE-acetaminophen (PERCOCET) 5-325 MG per tablet Take 1-2 tablets by mouth every 6 (six) hours as needed for Pain, Starting Tue 03/18/2019, E-Rx                       Wyona Almas, MD  03/25/19 1214

## 2019-03-18 NOTE — Progress Notes (Signed)
[facility]  [address]  [place], [place], [address]  [phone]  03/18/2019     Patient:   [name]                                                  CSN:        [id number]                                          DOB:       09-03-1960                                                    MRN:        [id number]     Chief Complaint   Patient presents with    Follow-up     fell on Sat  injured left elbow      SUBJECTIVE     History was provided by the patient and his roommate    HPI: Patient is a 58 y.o. female who presents today to follow-up on hypertension and to discuss a new concern.    Patient states that her home blood pressure readings were also persistently above 140/90.  So she restarted the amlodipine 5 mg a day.  She states that she started feeling unsteady on her feet again with starting the amlodipine.   Patient admits that she has also been drinking alcohol but states that she stumbles and when she does not drink alcohol.    She fell down 4 stairs at home onto carpeted floor .  Patient states that she fell onto her left elbow which she fractured when she was 58 years old.  Patient did not seek medical care after she fell but wants the area to be examined today since her left arm is not swollen and her elbow joint is red and very warm to touch.    She is having severe pain in the area and she can barely move that arm.  She wore the sling and put it on there    Pt denies h/o of blood clot,  fever, cough, dizziness, chest pain, headache, nausea, vomiting, SOB, diarrhea, constipation, rash,  abdominal or urinary changes    Patient states that she will need a refill of Creon soon.  She will reach out to her gastroenterologist at [facility] for refill.  He will follow-up appointment with GI in March 2021  Patient states that she checked her records at home  when she received the pneumonia 13 before    She would like to get a pneumonia 23 in the future due to her chronic liver problems and since she is a current smoker.  Patient states that she is still cutting down her many cigarettes she smokes.    Patient roommate admitted that he drank alcohol couple days ago.  He states that he is trying to convince patient to go to AA meetings with him          Past Medical History:   Diagnosis Date    Ascites  Asthma without status asthmaticus     Clostridium difficile colitis     Disorder of liver     multifocal cystic irregularities    DVT (deep venous thrombosis)     Gastroesophageal reflux disease     H/O ETOH abuse     sober 2 years until 03/2014    Hypertension     Hypokalemia     Pancreatitis     S/P IVC filter      Past Surgical History:   Procedure Laterality Date    CHOLECYSTECTOMY      COLONOSCOPY  12/26/2012    Procedure: COLONOSCOPY;  Surgeon: Gwenith Spitz, MD;  Location: [facility];  Service: Gastroenterology;  Laterality: N/A;    COLONOSCOPY, POLYPECTOMY  12/26/2012    Procedure: COLONOSCOPY, POLYPECTOMY;  Surgeon: Gwenith Spitz, MD;  Location: [facility];  Service: Gastroenterology;  Laterality: N/A;    DEBRIDEMENT & IRRIGATION, WOUND CLOSURE Right 08/29/2018    Procedure: WASHOUT AND CLOSURE OF TRAUMATIC WOUND, RIGHT FOREARM;  Surgeon: Arnoldo Hooker, MD;  Location: [facility];  Service: General;  Laterality: Right;  Right forearm I&D poss. wound closure    PANCREATECTOMY  01/2011    partial    THORACENTESIS Right 2012    pl effusion     Family History   Adopted: Yes     Social History     Social History Narrative    Not on file     Allergies   Allergen Reactions    Norvasc [Amlodipine] Other (See Comments)     Unsteady on feet    Morphine Hallucinations     Outpatient Medications Marked as Taking for the 03/18/19 encounter (Office Visit) with Penny Pia, MD   Medication Sig Dispense Refill    albuterol  (PROVENTIL HFA;VENTOLIN HFA) 108 (90 Base) MCG/ACT inhaler Inhale 2 puffs into the lungs every 6 (six) hours as needed for Wheezing      Cholecalciferol (Vitamin D3) 1.25 MG (50000 UT) Cap Take 1 capsule by mouth once a week 12 capsule 0    esomeprazole (NexIUM) 40 MG capsule Take 1 capsule (40 mg total) by mouth daily 90 capsule 1    fluticasone (Flonase) 50 MCG/ACT nasal spray 1 spray by Nasal route daily 1 Bottle 4    folic acid (FOLVITE) 1 MG tablet Take 1 tablet (1 mg total) by mouth daily 90 tablet 1    furosemide (LASIX) 40 MG tablet Take 1 tablet (40 mg total) by mouth daily as needed (swelling in feet or weight gain) 30 tablet 5    KLOR-CON M 20 MEQ tablet TAKE 1 TABLET BY MOUTH THREE TIMES DAILY ONLY ON DAYS YOU TAKE LASIX 270 tablet 1    magnesium oxide (MAG-OX) 400 MG tablet Take 2 tablets (800 mg total) by mouth 3 (three) times daily with meals 360 tablet 1    Multiple Vitamins-Minerals (MULTIVITAMIN WITH MINERALS) tablet Take 1 tablet by mouth every morning.         ondansetron (ZOFRAN-ODT) 4 MG disintegrating tablet Take 1 tablet (4 mg total) by mouth every 12 (twelve) hours as needed for Nausea 20 tablet 0    Pancrelipase, Lip-Prot-Amyl, (CREON PO) Take 24,000 Unit by mouth 3 (three) times daily with meals      thiamine (B-1) 100 MG tablet Take 1 tablet (100 mg total) by mouth daily 30 tablet 5    [DISCONTINUED] sodium bicarbonate 650 MG tablet Take 1 tablet (650 mg total) by mouth 3 (three) times daily  90 tablet 5    [DISCONTINUED] spironolactone (ALDACTONE) 50 MG tablet Take 1 tablet (50 mg total) by mouth daily Pt take one q day 30 tablet 5       Review of Systems  Review of Systems: Items that patient complains of are in bold.  Items that the patient denies are not bolded.   General: Fever, Chills.   Eyes: Discharge  Ears/Nose/Throat: Earache, Congestion, Sore Throat.   Cardiovascular: Chest Pain, Palpitations, Peripheral Edema.   Respiratory: Cough, Dyspnea.   Gastrointestinal:  Nausea, Vomiting, Diarrhea, Constipation, Abdominal Pain, Melena, Hematochezia.   Genitourinary:  Dysuria, Hematuria, Discharge or Incontinence.  Musculoskeletal:  Joint Pain. Left elbow injury  Skin: Rash   Neurologic: Weakness. Unsteady on feet  Heme/Lymphatic: Abnormal Bruising.        PHYSICAL EXAM   BP (!) 152/100 (BP Site: Right arm, Patient Position: Sitting, Cuff Size: Large)    Pulse 60    Temp 97.1 F (36.2 C) (Temporal)    Wt 73 kg (161 lb)    SpO2 97%    BMI 25.95 kg/m     Physical Exam    Constitutional: Well developed, well nourished, active, in no apparent distress, present with roommate  HENT:   Head: Normocephalic, atraumatic  Ears: No external lesions.  Nose: No external lesions. No epistaxis or drainage.  Eyes: PERRL. No scleral icterus. No conjunctival injection. EOMI.  Neck: Trachea is midline. No JVD. Normal range of motion. No apparent masses.  Cardiovascular: Regular rhythm, S1 normal and S2 normal.    No murmur heard.  Pulmonary/Chest: Effort normal. Lungs clear to auscultation bilaterally.   Abdominal: Soft, non-tender, non-distended. No masses.   Genitourinay/Anorectal: Defferred  Musculoskeletal: Gait normal. Left arm is in a sling with significant swelling, erythema and increased warmth of the posterior aspect of left elbow.  Patient's arm and hands have mild swelling . ROM of left arm is limited  Neurological: Pt is alert. Cranial nerves are grossly intact. Moving  extremities without apparent deficit.   Psychiatric: Affect is appropriate. There is no agitation.   Skin: Skin is warm, dry, well perfused. No rash noted. No cyanosis. No pallor. No apparent wound.      LABS     Lab Results   Component Value Date    CHOL 172 10/15/2018    HDL 70 (H) 10/15/2018    TRIG 97 10/15/2018    NA 134 12/05/2018    K 4.0 12/05/2018    BUN 11 12/05/2018    GLU 86 12/05/2018    CA 9.1 12/05/2018    AST 104 (H) 12/05/2018    ALT 43 (H) 12/05/2018    ALKPHOS 174 (H) 12/05/2018    TSH 0.754 12/05/2018          UPDATED IMMUNIZATIONS     Immunization History   Administered Date(s) Administered    Influenza quad 6 MOS to 64 YRS (Flulaval/Fluarix) 01/29/2019    Influenza quadrivalent (IM) PF 3 Yrs & greater 12/26/2017    Tdap 02/28/2018    Zoster [facility]) Vaccine Recombinant 02/07/2019         ASSESSMENT and PLAN     1. Fall, initial encounter     2. Pain and swelling of left elbow     3. Essential hypertension  lisinopril (ZESTRIL) 5 MG tablet   4. Uncomplicated asthma, unspecified asthma severity, unspecified whether persistent     5. Continuous chronic alcoholism     6. Tobacco use disorder  7. Need for vaccination for Strep pneumoniae       Patient advised to stop amlodipine due to side effects.  She agrees to trial of lisinopril 5 mg a day for treatment of hypertension.  Side effects of medications were discussed with patient.  Contact the office if problems arise  Patient is advised to go to the emergency room for further management of her elbow problems to rule out possible fracture, DVT versus septic joint  Elevated blood pressure readings in clinic. Past readings in the clinic have been under the goal of 140/90.   Patient is advised to check with her insurance to make sure that her pneumonia 23 will be covered    I advised pt to abstain from alcohol use    Follow up in 4 weeks for follow-up of hypertension, elbow problem and vaccination. Will discuss pt scheduling low dose Ct chest for lung cancer screening and for a screening mammogram at her next visit    Penny Pia, MD

## 2019-03-20 ENCOUNTER — Telehealth (INDEPENDENT_AMBULATORY_CARE_PROVIDER_SITE_OTHER): Payer: Self-pay | Admitting: Orthopaedic Surgery

## 2019-03-20 ENCOUNTER — Telehealth (INDEPENDENT_AMBULATORY_CARE_PROVIDER_SITE_OTHER): Payer: Self-pay

## 2019-03-20 DIAGNOSIS — S42402A Unspecified fracture of lower end of left humerus, initial encounter for closed fracture: Secondary | ICD-10-CM

## 2019-03-20 NOTE — Telephone Encounter (Signed)
Please order CT so it may be auth'ed & pt scheduled for f/up.

## 2019-03-20 NOTE — Telephone Encounter (Signed)
Pt called in and stated that she did go to the ER and she broke her arm in 2 different spots above elbow and below elbow. She did not shatter her elbow. She is needing a referral to be seen by Ortho. She was recommended Dr. Montel Culver, would you be able to place referral? Thanks (KS)

## 2019-03-21 ENCOUNTER — Ambulatory Visit: Admission: RE | Admit: 2019-03-21 | Payer: 59 | Source: Ambulatory Visit

## 2019-03-21 NOTE — Telephone Encounter (Signed)
Order for ct has been placed

## 2019-03-21 NOTE — Telephone Encounter (Signed)
LM on VM that CT sched for today at 2:30 @ [facility], arrive @ 2:15, f/up made in this practice for 12/14 @ 11:15.  Requested call back to confirm receiving message.

## 2019-03-24 ENCOUNTER — Telehealth (INDEPENDENT_AMBULATORY_CARE_PROVIDER_SITE_OTHER): Payer: Self-pay | Admitting: Orthopaedic Surgery

## 2019-03-24 ENCOUNTER — Ambulatory Visit (INDEPENDENT_AMBULATORY_CARE_PROVIDER_SITE_OTHER): Payer: 59 | Admitting: Medical

## 2019-03-24 NOTE — Telephone Encounter (Signed)
Per AH, she has had conversation w/ Pt as to what she needs to do.

## 2019-03-24 NOTE — Telephone Encounter (Signed)
Spoke w/pt, CT scan & f/up rescheduled.

## 2019-03-24 NOTE — Telephone Encounter (Signed)
Pt called stating that she was seen in ED w/ broken arm. She has a deformity in her elbow already per pt from prior childhood injury.

## 2019-03-27 ENCOUNTER — Ambulatory Visit: Admission: RE | Admit: 2019-03-27 | Payer: 59 | Source: Ambulatory Visit

## 2019-03-28 ENCOUNTER — Ambulatory Visit (INDEPENDENT_AMBULATORY_CARE_PROVIDER_SITE_OTHER): Payer: 59

## 2019-03-31 ENCOUNTER — Ambulatory Visit
Admission: RE | Admit: 2019-03-31 | Discharge: 2019-03-31 | Disposition: A | Discharge status: Home / Self Care | Payer: 59 | Source: Ambulatory Visit | Attending: Medical | Admitting: Medical

## 2019-03-31 DIAGNOSIS — Z01818 Encounter for other preprocedural examination: Secondary | ICD-10-CM | POA: Insufficient documentation

## 2019-03-31 DIAGNOSIS — M25022 Hemarthrosis, left elbow: Secondary | ICD-10-CM | POA: Insufficient documentation

## 2019-03-31 DIAGNOSIS — S42402A Unspecified fracture of lower end of left humerus, initial encounter for closed fracture: Secondary | ICD-10-CM | POA: Insufficient documentation

## 2019-04-02 ENCOUNTER — Ambulatory Visit (INDEPENDENT_AMBULATORY_CARE_PROVIDER_SITE_OTHER): Payer: 59

## 2019-04-02 ENCOUNTER — Ambulatory Visit (INDEPENDENT_AMBULATORY_CARE_PROVIDER_SITE_OTHER): Payer: 59 | Admitting: Orthopaedic Surgery

## 2019-04-02 ENCOUNTER — Encounter: Payer: Self-pay | Admitting: Orthopaedic Surgery

## 2019-04-02 ENCOUNTER — Encounter (INDEPENDENT_AMBULATORY_CARE_PROVIDER_SITE_OTHER): Payer: Self-pay

## 2019-04-02 DIAGNOSIS — Z1159 Encounter for screening for other viral diseases: Secondary | ICD-10-CM

## 2019-04-02 DIAGNOSIS — S42472A Displaced transcondylar fracture of left humerus, initial encounter for closed fracture: Secondary | ICD-10-CM

## 2019-04-02 DIAGNOSIS — S42402A Unspecified fracture of lower end of left humerus, initial encounter for closed fracture: Secondary | ICD-10-CM

## 2019-04-02 MED ORDER — HYDROCODONE-ACETAMINOPHEN 5-325 MG PO TABS
1.0000 | ORAL_TABLET | ORAL | 0 refills | Status: DC | PRN
Start: 2019-04-02 — End: 2019-04-10

## 2019-04-02 NOTE — Telephone Encounter (Signed)
Referral sent internally (KS)

## 2019-04-02 NOTE — Progress Notes (Signed)
[facility]  H&P    Demographics:      Date Time: 04/02/2019 3:05 PM  Patient Name: [name], [name]  DoB: [birth year]  Age: 58 y.o.   PCP: Penny Pia, MD    History of Present Illness:      Chief Complaint   Patient presents with    Arm Injury     DISTAL HUMERUS FX S/P CT SCAN       58 y.o. year old female presents for initial evaluation of a left distal humerus fracture sustained 03/15/2019 after a fall.  No other injuries.  No numbness / tingling.  Patient reports chronic deformity of the left elbow stemming from an unrecognized dislocation as a child    Medical History:      Past Medical History:   Diagnosis Date    Asthma without status asthmaticus     Disorder of liver     multifocal cystic irregularities    DVT (deep venous thrombosis)     Gastroesophageal reflux disease     H/O ETOH abuse     sober 2 years until 03/2014    Hypertension     Pancreatitis       Past Surgical History:   Procedure Laterality Date    CHOLECYSTECTOMY      COLONOSCOPY  12/26/2012    Procedure: COLONOSCOPY;  Surgeon: Gwenith Spitz, MD;  Location: [facility];  Service: Gastroenterology;  Laterality: N/A;    COLONOSCOPY, POLYPECTOMY  12/26/2012    Procedure: COLONOSCOPY, POLYPECTOMY;  Surgeon: Gwenith Spitz, MD;  Location: [facility];  Service: Gastroenterology;  Laterality: N/A;    DEBRIDEMENT & IRRIGATION, WOUND CLOSURE Right 08/29/2018    Procedure: WASHOUT AND CLOSURE OF TRAUMATIC WOUND, RIGHT FOREARM;  Surgeon: Arnoldo Hooker, MD;  Location: [facility];  Service: General;  Laterality: Right;  Right forearm I&D poss. wound closure    PANCREATECTOMY  01/2011    partial    THORACENTESIS Right 2012    pl effusion      (Not in a hospital admission)    Allergies   Allergen Reactions    Norvasc [Amlodipine] Other (See Comments)     Unsteady on feet    Morphine Hallucinations      Social History     Tobacco Use    Smoking status: Current Every Day Smoker     Packs/day: 0.25      Years: 30.00     Pack years: 7.50     Types: Cigarettes    Smokeless tobacco: Never Used    Tobacco comment: 1 pack every 3 days   Substance Use Topics    Alcohol use: Yes     Comment: 4-5 mixed drinks per day      Family History   Adopted: Yes        Review of Systems:      Review of systems:   Constitutional:  Negative for recent illness              Cardio:  No chest pain              Respiratory:  No SOB   MSK:  As noted in HPI, otherwise negative    Objective:        Vital Signs: There were no vitals taken for this visit.     Appearance: Well nourished, well developed 58 y.o. female patient.     Neurologic:      Vascular:  A/O x3,  Patient is Neurologically intact in the left upper extremity.    Extremity warm, well perfused  Cap refill <2 sec     Integumentary: Swelling not significant.  No open lesions      MSK: (+)tenderness affected extremity  (+)pain with ROM                           Imaging Review:      X-rays and CT of the left elbow show a displaced transcondylar distal humerus fracture.  Also noted to have chronic deformity of the radial head and the radiocapitellar articulation       Assessment:      Closed left distal humerus fracture    Plan:      OR next week for ORIF left distal humerus

## 2019-04-03 ENCOUNTER — Ambulatory Visit (INDEPENDENT_AMBULATORY_CARE_PROVIDER_SITE_OTHER): Payer: 59

## 2019-04-03 ENCOUNTER — Other Ambulatory Visit
Admission: RE | Admit: 2019-04-03 | Discharge: 2019-04-03 | Disposition: A | Discharge status: Home / Self Care | Payer: 59 | Source: Ambulatory Visit | Attending: Medical | Admitting: Medical

## 2019-04-03 DIAGNOSIS — Z1159 Encounter for screening for other viral diseases: Secondary | ICD-10-CM

## 2019-04-03 LAB — VH APTIMA SARS-COV-2 ASSAY (PANTHER SYSTEM)(TM)
Aptima SARS-CoV-2: NEGATIVE
Does patient have symptoms related to condition of interest?: NEGATIVE
Does patient reside in a congregate care setting?: NEGATIVE
Is patient admitted to the intensive care unit?: NEGATIVE
Is patient employed in a healthcare setting?: NEGATIVE
Is the patient hospitalized because of this condition?: NEGATIVE
Is the patient pregnant?: NEGATIVE

## 2019-04-07 ENCOUNTER — Ambulatory Visit: Payer: 59

## 2019-04-08 ENCOUNTER — Ambulatory Visit
Admission: RE | Admit: 2019-04-08 | Discharge: 2019-04-08 | Disposition: A | Discharge status: Home / Self Care | Payer: 59 | Attending: Orthopaedic Surgery | Admitting: Orthopaedic Surgery

## 2019-04-08 ENCOUNTER — Ambulatory Visit: Payer: 59

## 2019-04-08 ENCOUNTER — Encounter: Payer: Self-pay | Admitting: Orthopaedic Surgery

## 2019-04-08 ENCOUNTER — Encounter
Admission: RE | Disposition: A | Discharge status: Home / Self Care | Payer: Self-pay | Source: Home / Self Care | Attending: Orthopaedic Surgery

## 2019-04-08 ENCOUNTER — Ambulatory Visit (HOSPITAL_BASED_OUTPATIENT_CLINIC_OR_DEPARTMENT_OTHER): Payer: 59 | Admitting: Anesthesiology

## 2019-04-08 ENCOUNTER — Ambulatory Visit: Payer: 59 | Admitting: Anesthesiology

## 2019-04-08 DIAGNOSIS — W19XXXA Unspecified fall, initial encounter: Secondary | ICD-10-CM | POA: Insufficient documentation

## 2019-04-08 DIAGNOSIS — S42492A Other displaced fracture of lower end of left humerus, initial encounter for closed fracture: Secondary | ICD-10-CM

## 2019-04-08 DIAGNOSIS — Z90411 Acquired partial absence of pancreas: Secondary | ICD-10-CM | POA: Insufficient documentation

## 2019-04-08 DIAGNOSIS — I10 Essential (primary) hypertension: Secondary | ICD-10-CM | POA: Insufficient documentation

## 2019-04-08 DIAGNOSIS — Z9049 Acquired absence of other specified parts of digestive tract: Secondary | ICD-10-CM | POA: Insufficient documentation

## 2019-04-08 DIAGNOSIS — J45909 Unspecified asthma, uncomplicated: Secondary | ICD-10-CM | POA: Insufficient documentation

## 2019-04-08 DIAGNOSIS — Z86010 Personal history of colonic polyps: Secondary | ICD-10-CM | POA: Insufficient documentation

## 2019-04-08 DIAGNOSIS — K219 Gastro-esophageal reflux disease without esophagitis: Secondary | ICD-10-CM | POA: Insufficient documentation

## 2019-04-08 DIAGNOSIS — S52125A Nondisplaced fracture of head of left radius, initial encounter for closed fracture: Secondary | ICD-10-CM

## 2019-04-08 DIAGNOSIS — S42402A Unspecified fracture of lower end of left humerus, initial encounter for closed fracture: Secondary | ICD-10-CM | POA: Insufficient documentation

## 2019-04-08 DIAGNOSIS — F17210 Nicotine dependence, cigarettes, uncomplicated: Secondary | ICD-10-CM | POA: Insufficient documentation

## 2019-04-08 DIAGNOSIS — Z888 Allergy status to other drugs, medicaments and biological substances status: Secondary | ICD-10-CM | POA: Insufficient documentation

## 2019-04-08 DIAGNOSIS — S42495A Other nondisplaced fracture of lower end of left humerus, initial encounter for closed fracture: Secondary | ICD-10-CM

## 2019-04-08 DIAGNOSIS — Z885 Allergy status to narcotic agent status: Secondary | ICD-10-CM | POA: Insufficient documentation

## 2019-04-08 DIAGNOSIS — Z86718 Personal history of other venous thrombosis and embolism: Secondary | ICD-10-CM | POA: Insufficient documentation

## 2019-04-08 HISTORY — PX: ORIF, HUMERUS, DISTAL: SHX4897

## 2019-04-08 LAB — CBC AND DIFFERENTIAL
Basophils %: 1.6 % (ref 0.0–3.0)
Basophils Absolute: 0.1 10*3/uL (ref 0.0–0.3)
Eosinophils %: 4.3 % (ref 0.0–7.0)
Eosinophils Absolute: 0.2 10*3/uL (ref 0.0–0.8)
Hematocrit: 33.8 % — ABNORMAL LOW (ref 36.0–48.0)
Hemoglobin: 11.2 gm/dL — ABNORMAL LOW (ref 12.0–16.0)
Lymphocytes Absolute: 2.2 10*3/uL (ref 0.6–5.1)
Lymphocytes: 41.4 % (ref 15.0–46.0)
MCH: 33 pg (ref 28–35)
MCHC: 33 gm/dL (ref 32–36)
MCV: 100 fL (ref 80–100)
MPV: 7.1 fL (ref 6.0–10.0)
Monocytes Absolute: 0.9 10*3/uL (ref 0.1–1.7)
Monocytes: 16.1 % — ABNORMAL HIGH (ref 3.0–15.0)
Neutrophils %: 36.6 % — ABNORMAL LOW (ref 42.0–78.0)
Neutrophils Absolute: 2 10*3/uL (ref 1.7–8.6)
PLT CT: 254 10*3/uL (ref 130–440)
RBC: 3.4 10*6/uL — ABNORMAL LOW (ref 3.80–5.00)
RDW: 16.1 % — ABNORMAL HIGH (ref 11.0–14.0)
WBC: 5.3 10*3/uL (ref 4.0–11.0)

## 2019-04-08 LAB — BASIC METABOLIC PANEL
Anion Gap: 12.6 mMol/L (ref 7.0–18.0)
BUN / Creatinine Ratio: 16 Ratio (ref 10.0–30.0)
BUN: 16 mg/dL (ref 7–22)
CO2: 17 mMol/L — ABNORMAL LOW (ref 20–30)
Calcium: 10 mg/dL (ref 8.5–10.5)
Chloride: 108 mMol/L (ref 98–110)
Creatinine: 1 mg/dL (ref 0.60–1.20)
EGFR: 62 mL/min/{1.73_m2} (ref 60–150)
Glucose: 105 mg/dL — ABNORMAL HIGH (ref 71–99)
Osmolality Calculated: 268 mOsm/kg — ABNORMAL LOW (ref 275–300)
Potassium: 4.6 mMol/L (ref 3.5–5.3)
Sodium: 133 mMol/L — ABNORMAL LOW (ref 136–147)

## 2019-04-08 SURGERY — OPEN TREATMENT, HUMERAL FRACTURE DISTAL EXTRA-ARTICULAR, INTERNAL FIXATION
Anesthesia: Anesthesia General | Site: Arm Upper | Laterality: Left | Wound class: Clean

## 2019-04-08 MED ORDER — VH PHENYLEPHRINE 30 MG IN NS 250 ML INFUSION (SIMPLE)
INTRAVENOUS | Status: AC
Start: 2019-04-08 — End: ?
  Filled 2019-04-08: qty 250

## 2019-04-08 MED ORDER — FENTANYL CITRATE (PF) 50 MCG/ML IJ SOLN (WRAP)
INTRAMUSCULAR | Status: DC | PRN
Start: 2019-04-08 — End: 2019-04-08
  Administered 2019-04-08: 100 ug via INTRAVENOUS
  Administered 2019-04-08 (×2): 50 ug via INTRAVENOUS

## 2019-04-08 MED ORDER — DIPHENHYDRAMINE HCL 50 MG/ML IJ SOLN
25.0000 mg | Freq: Four times a day (QID) | INTRAMUSCULAR | Status: DC | PRN
Start: 2019-04-08 — End: 2019-04-08

## 2019-04-08 MED ORDER — DEXAMETHASONE SODIUM PHOSPHATE 4 MG/ML IJ SOLN
INTRAMUSCULAR | Status: AC
Start: 2019-04-08 — End: ?
  Filled 2019-04-08: qty 1

## 2019-04-08 MED ORDER — LACTATED RINGERS IV SOLN
INTRAVENOUS | Status: DC
Start: 2019-04-08 — End: 2019-04-08

## 2019-04-08 MED ORDER — CEFAZOLIN SODIUM 1 G IJ SOLR
INTRAMUSCULAR | Status: AC
Start: 2019-04-08 — End: ?
  Filled 2019-04-08: qty 2000

## 2019-04-08 MED ORDER — FENTANYL CITRATE (PF) 50 MCG/ML IJ SOLN (WRAP)
INTRAMUSCULAR | Status: AC
Start: 2019-04-08 — End: ?
  Filled 2019-04-08: qty 5

## 2019-04-08 MED ORDER — FENTANYL CITRATE (PF) 50 MCG/ML IJ SOLN (WRAP)
INTRAMUSCULAR | Status: AC
Start: 2019-04-08 — End: ?
  Filled 2019-04-08: qty 2

## 2019-04-08 MED ORDER — ROPIVACAINE HCL 5 MG/ML IJ SOLN
INTRAMUSCULAR | Status: DC | PRN
Start: 2019-04-08 — End: 2019-04-08
  Administered 2019-04-08: 20 mL via PERINEURAL

## 2019-04-08 MED ORDER — EPHEDRINE SULFATE 50 MG/ML IJ/IV SOLN (WRAP)
Status: DC | PRN
Start: 2019-04-08 — End: 2019-04-08
  Administered 2019-04-08: 20 mg via INTRAVENOUS

## 2019-04-08 MED ORDER — MIDAZOLAM HCL 1 MG/ML IJ SOLN (WRAP)
INTRAMUSCULAR | Status: DC | PRN
Start: 2019-04-08 — End: 2019-04-08
  Administered 2019-04-08: 2 mg via INTRAVENOUS

## 2019-04-08 MED ORDER — ESMOLOL HCL 100 MG/10ML IV SOLN
INTRAVENOUS | Status: AC
Start: 2019-04-08 — End: ?
  Filled 2019-04-08: qty 10

## 2019-04-08 MED ORDER — ROCURONIUM BROMIDE 50 MG/5ML IV SOLN
INTRAVENOUS | Status: AC
Start: 2019-04-08 — End: ?
  Filled 2019-04-08: qty 5

## 2019-04-08 MED ORDER — LIDOCAINE HCL (PF) 2 % IJ SOLN
INTRAMUSCULAR | Status: AC
Start: 2019-04-08 — End: ?
  Filled 2019-04-08: qty 10

## 2019-04-08 MED ORDER — ACETAMINOPHEN 10 MG/ML IV SOLN
INTRAVENOUS | Status: AC
Start: 2019-04-08 — End: ?
  Filled 2019-04-08: qty 200

## 2019-04-08 MED ORDER — ROPIVACAINE HCL 5 MG/ML IJ SOLN
INTRAMUSCULAR | Status: AC
Start: 2019-04-08 — End: ?
  Filled 2019-04-08: qty 30

## 2019-04-08 MED ORDER — CEFAZOLIN SODIUM 1 G IJ SOLR
2.00 g | Freq: Once | INTRAMUSCULAR | Status: AC
Start: 2019-04-08 — End: 2019-04-08
  Administered 2019-04-08 (×2): 2 g via INTRAVENOUS

## 2019-04-08 MED ORDER — PROPOFOL 200 MG/20ML IV EMUL
INTRAVENOUS | Status: DC | PRN
Start: 2019-04-08 — End: 2019-04-08
  Administered 2019-04-08: 150 mg via INTRAVENOUS
  Administered 2019-04-08: 30 mg via INTRAVENOUS

## 2019-04-08 MED ORDER — ONDANSETRON HCL 4 MG/2ML IJ SOLN
INTRAMUSCULAR | Status: AC
Start: 2019-04-08 — End: ?
  Filled 2019-04-08: qty 2

## 2019-04-08 MED ORDER — ROCURONIUM BROMIDE 50 MG/5ML IV SOLN
INTRAVENOUS | Status: DC | PRN
Start: 2019-04-08 — End: 2019-04-08
  Administered 2019-04-08: 50 mg via INTRAVENOUS
  Administered 2019-04-08 (×2): 20 mg via INTRAVENOUS
  Administered 2019-04-08: 10 mg via INTRAVENOUS

## 2019-04-08 MED ORDER — LIDOCAINE HCL (PF) 2 % IJ SOLN
INTRAMUSCULAR | Status: AC
Start: 2019-04-08 — End: ?
  Filled 2019-04-08: qty 5

## 2019-04-08 MED ORDER — MIDAZOLAM HCL 1 MG/ML IJ SOLN (WRAP)
INTRAMUSCULAR | Status: AC
Start: 2019-04-08 — End: ?
  Filled 2019-04-08: qty 2

## 2019-04-08 MED ORDER — DEXAMETHASONE SODIUM PHOSPHATE 4 MG/ML IJ SOLN
INTRAMUSCULAR | Status: DC | PRN
Start: 2019-04-08 — End: 2019-04-08
  Administered 2019-04-08: 4 mg via INTRAVENOUS

## 2019-04-08 MED ORDER — FENTANYL CITRATE (PF) 50 MCG/ML IJ SOLN (WRAP)
25.0000 ug | INTRAMUSCULAR | Status: DC | PRN
Start: 2019-04-08 — End: 2019-04-08

## 2019-04-08 MED ORDER — ONDANSETRON HCL 4 MG/2ML IJ SOLN
INTRAMUSCULAR | Status: DC | PRN
Start: 2019-04-08 — End: 2019-04-08
  Administered 2019-04-08: 8 mg via INTRAVENOUS

## 2019-04-08 MED ORDER — VH PHENYLEPHRINE 120 MCG/ML IV BOLUS (ANESTHESIA)
PREFILLED_SYRINGE | INTRAVENOUS | Status: AC
Start: 2019-04-08 — End: ?
  Filled 2019-04-08: qty 80

## 2019-04-08 MED ORDER — SUGAMMADEX SODIUM 200 MG/2ML IV SOLN
INTRAVENOUS | Status: DC | PRN
Start: 2019-04-08 — End: 2019-04-08
  Administered 2019-04-08: 200 mg via INTRAVENOUS

## 2019-04-08 MED ORDER — PROPOFOL 200 MG/20ML IV EMUL
INTRAVENOUS | Status: AC
Start: 2019-04-08 — End: ?
  Filled 2019-04-08: qty 20

## 2019-04-08 MED ORDER — EPHEDRINE SULFATE 50 MG/ML IJ/IV SOLN (WRAP)
Status: AC
Start: 2019-04-08 — End: ?
  Filled 2019-04-08: qty 1

## 2019-04-08 MED ORDER — LIDOCAINE HCL 2 % IJ SOLN
INTRAMUSCULAR | Status: DC | PRN
Start: 2019-04-08 — End: 2019-04-08
  Administered 2019-04-08: 40 mg via INTRAVENOUS

## 2019-04-08 MED ORDER — SUGAMMADEX SODIUM 200 MG/2ML IV SOLN
INTRAVENOUS | Status: AC
Start: 2019-04-08 — End: ?
  Filled 2019-04-08: qty 2

## 2019-04-08 MED ORDER — VH PHENYLEPHRINE 120 MCG/ML IV BOLUS (ANESTHESIA)
PREFILLED_SYRINGE | INTRAVENOUS | Status: DC | PRN
Start: 2019-04-08 — End: 2019-04-08
  Administered 2019-04-08 (×3): 240 ug via INTRAVENOUS

## 2019-04-08 MED ORDER — OXYCODONE-ACETAMINOPHEN 5-325 MG PO TABS
1.0000 | ORAL_TABLET | ORAL | 0 refills | Status: DC | PRN
Start: 2019-04-08 — End: 2019-05-19

## 2019-04-08 MED ORDER — VH PHENYLEPHRINE 30 MG IN NS 250 ML INFUSION (SIMPLE)
INTRAVENOUS | Status: DC | PRN
Start: 2019-04-08 — End: 2019-04-08
  Administered 2019-04-08: 20 ug/min via INTRAVENOUS

## 2019-04-08 SURGICAL SUPPLY — 56 items
BANDAGE ESMARK 4IN  STERILE LF (Dressings) ×2 IMPLANT
BANDAGE ORTHO DBL ACE 6" UNST (Dressings) ×2 IMPLANT
BIT CANNULATED 2.7 DRILL (Supply) ×1 IMPLANT
BIT DRILL 2.0MM (Supply) ×1 IMPLANT
BIT DRILL W/QC 2.5 MM LONG (Supply) ×1 IMPLANT
COVER LITE HANDLE DISP (Supply) ×6 IMPLANT
CUFF TOURNIQUET 18 (Supply) ×2 IMPLANT
CUFF TOURNIQUET 24 (Supply) ×1 IMPLANT
DRAPE ANTIMICROBIAL IOBAN (Supply) ×2 IMPLANT
DRAPE C-ARM X-RAY (Supply) ×2 IMPLANT
DRAPE TOP 102IN X 53IN (Supply) ×4 IMPLANT
DRAPE U (Supply) ×2 IMPLANT
DRSG XEROFORM 5 X 9 (Dressings) ×2 IMPLANT
GLOVE BIOGEL PI ORTHO SZ 8.0 (Supply) ×4 IMPLANT
GLOVE BIOGEL UND PI IND SZ 8.5 (Supply) ×2 IMPLANT
GLOVE SURGEON SYN P/F SZ 8 (Supply) ×2 IMPLANT
GOWN AURORA NONREINF STER LG (Supply) ×3 IMPLANT
GOWN AURORA NONREINF STER XLG (Supply) ×4 IMPLANT
HYDROGEN PEROXIDE 3% 118ML 4OZ (Supply) ×2 IMPLANT
LOOP VESSEL MAXI BLUE (Supply) ×1 IMPLANT
PACK EXTREMITY-LF (Supply) ×2 IMPLANT
PAD ARMBOARD 20 X 8 (Supply) ×5 IMPLANT
PLT 2.7-3.5 LCP HUM 1H LF MED ×1 IMPLANT
PLT 2.7-3.5 LCP HUM 2H LF MED ×1 IMPLANT
SCREW CANN SHORT 4.0 X 40 ×1 IMPLANT
SCREW CANN SHORT 4.0 X 44 ×1 IMPLANT
SCREW CANN SHORT 4.0 X 48 ×1 IMPLANT
SCREW CORTEX SELF-TAP 3.5X24 ×1 IMPLANT
SCREW CORTEX SELF-TAP 3.5X26 ×2 IMPLANT
SCREW MTAPHYSL SLFTP 2.7X30 ×1 IMPLANT
SCREW MTAPHYSL SLFTP 2.7X50 ×1 IMPLANT
SCREW T8 STL VAR ANG 2.7  30MM ×2 IMPLANT
SCREW T8 STL VAR ANG 2.7  38MM ×1 IMPLANT
SCREW T8 STL VAR ANG 2.7 16MM ×1 IMPLANT
SCREW T8 STL VAR ANG 2.7 22MM ×1 IMPLANT
SCREW T8 STL VAR ANG 2.7 40MM ×2 IMPLANT
SET CYSTO TUR Y TYPE (Supply) ×2 IMPLANT
SLEEVE ARM DISP SINGLE-USE (Supply) ×2 IMPLANT
SLING ARM LARGE (Supply) ×1 IMPLANT
SLING ARM MEDIUM EA (Supply) ×1 IMPLANT
SOL SALINE IRRIG 1000ML (Solutions) ×2 IMPLANT
SOL SALINE IRRIG 3000ML (Supply) ×2 IMPLANT
SPONGE LAP 18 X 18 (Supply) ×2 IMPLANT
STERISTRIP 1/2 IN X 4 IN (Supply) ×1 IMPLANT
SUT ETHILON 3-0 1669H (Supply) ×6 IMPLANT
SUT MONOCRYL 3-0 Y427H (Supply) ×1 IMPLANT
SUT VICRYL 0 J534H (Supply) ×2 IMPLANT
SUT VICRYL COATED 2-0 J869H (Supply) ×2 IMPLANT
TOWEL BLUE STERILE 6 PER PK (Supply) ×5 IMPLANT
TUBE YANKAUER WITHOUT VENT (Supply) ×1 IMPLANT
UNDERPAD BLUE 23X36  LF (Supply) ×2 IMPLANT
VIAL TINCTURE BENZOIN STERI-ST (Supply) ×1 IMPLANT
WASHER CANNULATED SM 7 MM ×2 IMPLANT
WIRE GUIDE 1.6MM ×1 IMPLANT
WIRE GUIDE THREADED 1.25X150MM ×3 IMPLANT
WIRE K TROCAR POINT 2.0 MM ×2 IMPLANT

## 2019-04-08 NOTE — Anesthesia Postprocedure Evaluation (Addendum)
Anesthesia Post Evaluation    Patient: [name]    Procedure(s) with comments:  ORIF, HUMERUS, DISTAL - ORIF LEFT DISTAL HEMERUS    Anesthesia type: general    Last Vitals:   Vitals Value Taken Time   BP 95/58 04/08/19 1432   Temp 36.3 C (97.3 F) 04/08/19 1432   Pulse 85 04/08/19 1432   Resp 17 04/08/19 1432   SpO2 94 % 04/08/19 1432                 Anesthesia Post Evaluation:     Patient Evaluated: bedside  Patient Participation: complete - patient participated  Level of Consciousness: awake  Pain Score: 2  Pain Management: adequate    Airway Patency: patent    Anesthetic complications: No      PONV Status: none    Cardiovascular status: acceptable  Respiratory status: acceptable  Hydration status: acceptable        Signed by: Shela Leff, 04/08/2019 3:00 PM

## 2019-04-08 NOTE — H&P (Signed)
I have reviewed the H&P, examined the patient and there are no changes.    Plan OR today for ORIF left distal humerus fracture

## 2019-04-08 NOTE — Anesthesia Procedure Notes (Signed)
Peripheral    Patient location during procedure: Pre-Op  Reason for block: Post-op pain managment  Injection technique: Single-shot  Block Region: Supraclavicular  Laterality: Left  Block at surgeon's request Yes    Staffing  Anesthesiologist: Shela Leff, MD  Performed: Anesthesiologist     Pre-procedure Checklist   Completed: patient identified, surgical consent, pre-op evaluation, timeout performed, risks and benefits discussed, anesthesia consent given and correct site      Peripheral Block  Patient monitoring: Pulse oximetry and NIBP  Patient position: Supine  Premedication: No  Local infiltration: Lidocaine 2%    Needle  Needle type: Stim needle   Needle gauge: 22 G  Needle length: 2 in    Procedures: ultrasound guided  Ultrasound Guided: LA spread visualized, Needle visualized, Relevant anatomy identified (nerve, vessels, muscle) and Image stored or printed      Assessment   Incremental injection: yes  Injection made incrementally with aspirations every 5 mL.  Injection Resistance: no  Paresthesia Pain: No    Blood Aspirated: No  no suspected intravascular injection  Patient tolerated procedure well: Yes  Block Outcome: No complications

## 2019-04-08 NOTE — Transfer of Care (Signed)
Anesthesia Transfer of Care Note    Patient: [name]    Last vitals:   Vitals:    04/08/19 1409   BP: 102/66   Pulse: (!) 118   Resp: 16   Temp: 36.2 C (97.2 F)   SpO2: 100%       Oxygen: Nasal Cannula     Mental Status:alert     Airway: Natural    Cardiovascular Status:  stable

## 2019-04-08 NOTE — Discharge Instr - AVS First Page (Addendum)
Fracture: Upper Extremity  You have had surgery for a fractured upper extremity.  You will likely be non-weight bearing for 6 weeks, sometimes longer.  You will be seen in the office regularly to ensure proper healing and rehabilitation.  Call the [facility] office at [phone] with any questions or concerns.    Home Care:  1. You will be in a soft dressing to limit movement at the affected joint.  You should begin gentle elbow range of motion immediately.  Unless you were told otherwise, do not bear weight on the injured wrist until cleared by your doctor to do so.  2. Keep your arm elevated to reduce pain and swelling. When sleeping, place a pillow under the injured arm. When sitting, support the injured arm so it is above the level of your heart. This is very important during the first 48 hours.  3. Apply an ice pack (ice cubes in a plastic bag, wrapped in a towel) over the injured area for 20 minutes every 1-2 hours the first day. You can place the ice pack directly over the dressing. Continue with ice packs 3-4 times a day for the next two days, then as needed for the relief of pain and swelling.  4. You may remove the dressing in 3 days.  After that you may shower as normal.  You may wish to keep a dry dressing over the wound if there is any drainage.  5. You may use acetaminophen (Tylenol) to control pain, unless another pain medicine was prescribed. [ NOTE : If you have chronic liver or kidney disease or ever had a stomach ulcer or GI bleeding, talk with your doctor before using these medicines.]      Follow Up  with [facility] in 2 weeks to be sure the bone is healing properly.      Get Prompt Medical Attention If Any Of The Following Occur:   Increased wound drainage or fevers or chills.   Increased tightness or pain under the dressing   Fingers become swollen, cold, blue, numb or tingly  GENERAL DISCHARGE INSTRUCTIONS: ORTHOPEDIC PROCEDURE    You may not drive or do  anything requiring coordination or balance for 24 hours.  Rest for the rest of the day.  Avoid heavy lifting for 2 weeks after any surgery.    You may not drink alcohol or consume non-prescribed sedatives or tranquilizers for 24 hours unless approved by your physician.    You should not sign important papers or make important decisions in the next 24 hours.    Please have someone responsible with you the first night you are home.    Keep your dressing/wound site clean and dry.  Do not remove the dressing until advised by your physician.  Wash your hands frequently before and after touching your surgical site.    Begin your diet with clear liquids and progress to your normal diet as long as you are not nauseated. It is suggested that you avoid greasy or spicy foods.    It is suggested that unless specified by the pharmacist, you take all medications with food.  All narcotic type pain medications can cause constipation, keep fluid intake up and increase fiber in your diet.    Things to call your surgeon for after Orthopedics surgery:  Persistent Nausea and Vomiting  Chills or a Fever above 101 degrees F  Persistent bleeding, swelling or pus at the operative site  Unable to urinate in 8  hours  Pain that is not relieved by the pain medication.  You may be discharged before the local or regional anesthesia has completely worn off, protect your surgical limb and avoid extremes of heat or cold during this time (unless specifically prescribed).  Move with assistance until sensation returns to normal.    Weight bearing as instructed by your physician.    Loss of feeling or inability to move fingers/toes on the surgical extremity  Blue color of nails or skin on the surgical extremity  Increased coldness of skin on the surgical extremity  Increased swelling of the surgical extremity, especially below the dressing/cast.  Increasing or severe pain, not relieved by your pain medication.

## 2019-04-08 NOTE — Nursing Progress Note (Addendum)
Vitals:    04/08/19 1430 04/08/19 1432 04/08/19 1438 04/08/19 1442   BP: 95/58 95/58 (!) 82/49 (!) 88/52   Pulse: 85 85 98    Resp: 17 17 15     Temp:  97.3 F (36.3 C) (!) 96.4 F (35.8 C)    TempSrc:  Skin Skin    SpO2:  94% 91%    Weight:       Height:          1447: Notified MD patient low BP on arrival from PACU; TVO for bolus and will be to bedside to evaluate. Patient alert and converses appropriately. Reports minimal discomfort (4/10--burning) that is tolerable.       1520: Notified Dr. Laural Benes BP=102/68 after bolus. Patient has been up to void without incident. Per MD, patient appropriate for discharge.

## 2019-04-08 NOTE — Op Note (Signed)
Date: 04/08/2019    PREOPERATIVE DIAGNOSIS: Closed left comminuted intra-articular  distal humerus fracture.    POSTOPERATIVE DIAGNOSIS: Closed left comminuted intra-articular  distal humerus fracture.    PROCEDURE: Open reduction and internal fixation, left  intra-articular distal humerus fracture.    SURGEON: Randolm Idol, MD    ANESTHESIA: General endotracheal.    ESTIMATED BLOOD LOSS: 100 cc.    COMPLICATIONS: None.    SPECIMEN: None.    INDICATION FOR SURGERY: [name] is a 58 year old female, who  sustained a comminuted fracture of the left distal humerus after  a fall nearly 3 weeks ago.  After review of her history and  imaging studies, surgery was recommended.    PROCEDURE NOTE: On the day of the operation, [name] was  seen in the preoperative area.  Her left arm was marked.  Informed consent was obtained.  She was then taken to the  operating room, where anesthesia was induced.  She was given a  preoperative dose of antibiotics, placed on the OR table in a  lateral decubitus position with her left arm over a bolster.  The left arm was prepped and draped in the usual sterile  fashion.  I then used an Esmarch to exsanguinate the arm and  inflated an upper arm tourniquet to 200 mmHg.  I then started  the case by utilizing a posterior approach to the distal  humerus.  I made approximately 12 to 15 cm incision over the  posterior upper arm extending around the olecranon and down  along the ulna.  I raised the medial and lateral flaps with  electrocautery and blunt dissection.  I then dissected down  bluntly along the medial side and identified the ulnar nerve.  I  freed it up along its course, both proximally and distally and I  kept it protected with a vessel loop.  I then debrided down  underneath the triceps along the medial side and dissected some  soft tissue off the medial condyle.  Significant amount of  osteiod and scar tissue given the age of the fracture but we were  able to debride the  medial side.  I then debrided the fracture  through the medial window as well.  It was a comminuted fracture  through the condyle and had significant comminution through the  metaphyseal region as well.  After doing a thorough debridement  medially, I turned our attention on the lateral side and I  dissected down underneath the triceps on the lateral side and  then debrided the fracture off the lateral condyle and along the  lateral shaft as well.  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  Once we had debrided the fracture both medially and  laterally, we had our medial and lateral condyles.  There was a  significant amount of small comminution that had to be debrided  and the metaphysis was markedly comminuted but we were able to  reduce the joint with a series of tenaculums and K-wires and  confirmed that it was suitably reduced.  I then placed partially threaded  cannulated screws from lateral to medial to give [name] a  provisional fixation of the joint.  I then reduced the joint  back to the shaft to what appeared to be  a suitable position.  However given the comminution through the olecranon fossa and  the metaphysis, there was not any keys to the reduction but we  got it reduced and held with some K-wires.  I then used a medial  variable angle locking plate and a lateral variable angle  locking plate to secure our reduction, getting as much fixation  into the distal humerus as possible.  I felt the lateral plate  placed on the lateral cortex of the distal humerus gave [name] the  best fixation distally.  I then placed cortical screws in the  shaft followed by some more locking screws proximal to the  fracture.  Once we had final fixation, I took a series of x-rays  confirming that we had suitable reduction of the fracture  and  that our hardware was in adequate position and not encroaching  on the elbow joint.  I took it through range of motion.  There  was no crepitus, seemed to have adequate and nearly full range  of motion.  I then released the tourniquet at 150 minutes,  copiously irrigated the wound with 3 L of saline using  cystoscopy tubing and gravity flow.  I then began closing and I  used a running 0 Vicryl to close some fascia over the lateral  side.  Medially we reinspected the ulnar nerve, it appeared to  be in good condition at the end of the case.  I let it lie in  situ, used 0 Vicryl to close some of the medial collateral  ligament.  I then used the 0-Vicryl for deeper fascia and 2-0  Vicryl for subcuticular tissue, and 3-0 Monocryl for skin.  Steri-Strips and dry sterile dressings were applied.  The  patient was allowed to awaken and was taken to the recovery room  in stable condition.        [id number]  DD: 04/08/2019 13:42:18  DT: 04/08/2019 14:29:37  JOB: [id number]

## 2019-04-08 NOTE — Brief Op Note (Signed)
BRIEF OP NOTE    Date Time: 04/08/19 1:33 PM    Patient Name:   [name]    Date of Operation:   04/08/2019    Providers Performing:   Surgeon(s):  Randolm Idol, MD    Assistant (s):   Circulator: Jamesetta So, RN  Radiology Technologist: Madlyn Frankel  Relief Circulator: Baldemar Lenis, RN  Relief Scrub: Cherylann Parr  Scrub Person: Jone Baseman, CSA  First Assistant: Kenna Gilbert, RN  Preceptor: Karna Dupes, RN    Operative Procedure:   Procedure(s):  ORIF, HUMERUS, DISTAL - Wound Class: Clean     Preoperative Diagnosis:   Pre-Op Diagnosis Codes:     * Other closed nondisplaced fracture of distal end of left humerus, initial encounter [S42.495A]     * Closed nondisplaced fracture of head of left radius, initial encounter [S52.125A]    Postoperative Diagnosis:   Post-Op Diagnosis Codes:     * Other closed nondisplaced fracture of distal end of left humerus, initial encounter [S42.495A]     * Closed nondisplaced fracture of head of left radius, initial encounter [S52.125A]    Anesthesia:   General/Block    Estimated Blood Loss:    100 mL    Implants:     Implant Name Type Inv. Item Serial No. Manufacturer Lot No. LRB No. Used Action   WASHER CANNULATED SM 7 MM - [id number]  WASHER CANNULATED SM 7 MM  VHSYNTHES  Left 2 Implanted   SCREW CANN SHORT 4.0 X 44 - [id number]  SCREW CANN SHORT 4.0 X 44  VHSYNTHES  Left 1 Implanted   SCREW CANN SHORT 4.0 X 48 - [id number]  SCREW CANN SHORT 4.0 X 48  VHSYNTHES  Left 1 Implanted   [place]-LCP LAT DISTAAL HUMERUS PL 2.7/3.5 2H      Left 1 Implanted   [place]-LCP EXT MEDL DIST HUM PL 2.7/3.5 1H      Left 1 Implanted   SCREW CORTEX SELF-TAP 3.5X24 - [id number]  SCREW CORTEX SELF-TAP 3.5X24  VHSYNTHES  Left 1 Implanted   SCREW CORTEX SELF-TAP 3.5X26 - [id number]  SCREW CORTEX SELF-TAP 3.5X26  VHSYNTHES  Left 2 Implanted   SCREW [place] LOCKING SLFTP 2.7X16 - [id number]  SCREW [place] LOCKING SLFTP 2.7X16  VHSYNTHES  Left 1 Implanted   SCREW  [place] LOCKING SLFTP 2.7X22 - [id number]  SCREW [place] LOCKING SLFTP 2.7X22  VHSYNTHES  Left 1 Implanted   SCREW [place] LOCKING SLFTP 2.7X30 - [id number]  SCREW [place] LOCKING SLFTP 2.7X30  VHSYNTHES  Left 2 Implanted   SCREW [place] LOCKING SLFTP 2.7X38 - [id number]  SCREW [place] LOCKING SLFTP 2.7X38  VHSYNTHES  Left 1 Implanted   SCREW [place] LOCKING SLFTP 2.7X40 - [id number]  SCREW [place] LOCKING SLFTP 2.7X40  VHSYNTHES  Left 2 Implanted   SCREW MT[place]YS[place] SLFTP 2.7X30 - [id number]  SCREW MTAPHYSL SLFTP 2.7X30  VHSYNTHES  Left 1 Implanted   SCREW MTAPHYSL SLFTP 2.7X50 - [id number]  SCREW MTAPHYSL SLFTP 2.7X50  VHSYNTHES  Left 1 Implanted       Drains:   Drains: no    Specimens:   * No specimens in log *      Findings:   Comminuted intra-articular left distal humerus fracture     Complications:   none      Signed by: Randolm Idol, MD  [facility]

## 2019-04-08 NOTE — Anesthesia Preprocedure Evaluation (Signed)
Anesthesia Evaluation    AIRWAY    Mallampati: II    TM distance: >3 FB  Neck ROM: full  Mouth Opening:full   CARDIOVASCULAR    cardiovascular exam normal, regular and normal       DENTAL         PULMONARY    pulmonary exam normal and clear to auscultation     OTHER FINDINGS                  Relevant Problems   PULMONARY   (+) SOB (shortness of breath)      NEURO/PSYCH   (+) History of pancreatitis      CARDIO   (+) Essential hypertension   (+) Hypertension   (+) Hypertension, essential      GI   (+) Gastroesophageal reflux disease       PSS Anesthesia Comments:   Labs pending. NPO. No chest pain or SOB.  Denies issues with anesthesia.  Says no alcohol x 3 weeks.  Agreeable to general with peripheral nerve block.         Anesthesia Plan    ASA 3     general                                 informed consent obtained      pertinent labs reviewed             Signed by: Shela Leff 04/08/19 7:12 AM

## 2019-04-09 ENCOUNTER — Telehealth (INDEPENDENT_AMBULATORY_CARE_PROVIDER_SITE_OTHER): Payer: Self-pay | Admitting: Orthopaedic Surgery

## 2019-04-09 ENCOUNTER — Encounter: Payer: Self-pay | Admitting: Orthopaedic Surgery

## 2019-04-09 NOTE — Telephone Encounter (Signed)
Pt states she was D/C w/oxycodone, unable to take as it causes headaches.  States the hydrocodone did not.  Also states she's unable to take acetaminophen due to her liver, advised the hydrocodone has acetaminophen in it.  She stated her internist advised her if she "needed to take aspirin to take ibuprofen".  Please review chart and change pain medication.

## 2019-04-10 MED ORDER — HYDROCODONE-ACETAMINOPHEN 5-325 MG PO TABS
1.0000 | ORAL_TABLET | ORAL | 0 refills | Status: DC | PRN
Start: 2019-04-10 — End: 2019-04-15

## 2019-04-10 NOTE — Telephone Encounter (Signed)
New script entered.

## 2019-04-10 NOTE — Telephone Encounter (Signed)
review 

## 2019-04-10 NOTE — Telephone Encounter (Signed)
LM on VM that a script for hydrocodone was sent to pharmacy, if pt can not tolerate that she'll need to use OTC ibuprofen.

## 2019-04-10 NOTE — Telephone Encounter (Signed)
Dr. Manson Passey has placed a script for narcotic medication. First Norco  then percocet on 29 December.  After this script advise patient to take ibuprofen as needed.  JJW

## 2019-04-15 ENCOUNTER — Other Ambulatory Visit
Admission: RE | Admit: 2019-04-15 | Discharge: 2019-04-15 | Disposition: A | Discharge status: Home / Self Care | Payer: 59 | Source: Ambulatory Visit | Attending: Family Medicine | Admitting: Family Medicine

## 2019-04-15 ENCOUNTER — Other Ambulatory Visit (INDEPENDENT_AMBULATORY_CARE_PROVIDER_SITE_OTHER): Payer: Self-pay | Admitting: Family Medicine

## 2019-04-15 ENCOUNTER — Other Ambulatory Visit (INDEPENDENT_AMBULATORY_CARE_PROVIDER_SITE_OTHER): Payer: Self-pay

## 2019-04-15 ENCOUNTER — Encounter (INDEPENDENT_AMBULATORY_CARE_PROVIDER_SITE_OTHER): Payer: Self-pay | Admitting: Family Medicine

## 2019-04-15 ENCOUNTER — Ambulatory Visit (INDEPENDENT_AMBULATORY_CARE_PROVIDER_SITE_OTHER): Payer: 59 | Admitting: Family Medicine

## 2019-04-15 VITALS — BP 108/82 | HR 77 | Temp 97.4°F | Resp 16 | Wt 152.0 lb

## 2019-04-15 DIAGNOSIS — Z8781 Personal history of (healed) traumatic fracture: Secondary | ICD-10-CM

## 2019-04-15 DIAGNOSIS — Z9181 History of falling: Secondary | ICD-10-CM

## 2019-04-15 DIAGNOSIS — S42402D Unspecified fracture of lower end of left humerus, subsequent encounter for fracture with routine healing: Secondary | ICD-10-CM

## 2019-04-15 DIAGNOSIS — Z760 Encounter for issue of repeat prescription: Secondary | ICD-10-CM

## 2019-04-15 DIAGNOSIS — D649 Anemia, unspecified: Secondary | ICD-10-CM

## 2019-04-15 DIAGNOSIS — R110 Nausea: Secondary | ICD-10-CM

## 2019-04-15 DIAGNOSIS — R200 Anesthesia of skin: Secondary | ICD-10-CM

## 2019-04-15 DIAGNOSIS — I10 Essential (primary) hypertension: Secondary | ICD-10-CM

## 2019-04-15 DIAGNOSIS — F17200 Nicotine dependence, unspecified, uncomplicated: Secondary | ICD-10-CM

## 2019-04-15 DIAGNOSIS — E871 Hypo-osmolality and hyponatremia: Secondary | ICD-10-CM

## 2019-04-15 DIAGNOSIS — J4520 Mild intermittent asthma, uncomplicated: Secondary | ICD-10-CM

## 2019-04-15 DIAGNOSIS — F1021 Alcohol dependence, in remission: Secondary | ICD-10-CM

## 2019-04-15 DIAGNOSIS — Z23 Encounter for immunization: Secondary | ICD-10-CM

## 2019-04-15 DIAGNOSIS — R42 Dizziness and giddiness: Secondary | ICD-10-CM

## 2019-04-15 MED ORDER — ONDANSETRON 4 MG PO TBDP
4.00 mg | ORAL_TABLET | Freq: Two times a day (BID) | ORAL | 2 refills | Status: DC | PRN
Start: 2019-04-15 — End: 2019-05-27

## 2019-04-15 NOTE — Patient Instructions (Signed)
Dizziness (Uncertain Cause)  Dizziness is a common symptom. It may be described as lightheadedness, spinning, or feeling like you are going to faint. Dizziness can have many causes.   Tell the healthcare provider about:   · All medicines you take, including prescription, over-the-counter, herbs, and supplements  · Any other symptoms you have  · Any health problems you are being treated for  · Any past major health problems you've had, such as a heart attack, balance issues, hearing problems, or blood pressure problems  · Anything that causes the dizziness to get worse or better  Today's exam did not show an exact cause for your dizziness . Other tests may be needed. Follow up with your healthcare provider.   Home care  · Dizziness that occurs with sudden standing may be a sign of mild dehydration. Drink extra fluids for the next few days.  · If you recently started a new medicine, stopped a medicine, or had the dose of a current medicine changed, talk with the prescribing healthcare provider. Your medicine plan may need adjustment.  · If dizziness lasts more than a few seconds, sit or lie down until it passes. This may help prevent injury in case you pass out. Get up slowly when you feel better.  · Don't drive or use power tools or dangerous equipment until you have had no dizziness for at least 48 hours.    Follow-up care  Follow up with your healthcare provider for further evaluation in the next 7 days, or as advised.   When to get medical advice  Call your healthcare provider for any of the following:   · Worsening of symptoms or new symptoms  · Repeated vomiting  · Headache  · Vision or hearing changes  Call 911  Call 911, or get medical care right away if any of these occur:   · Chest, arm, neck, back, or jaw pain  · Weakness of an arm or leg or one side of the face  · Blood in vomit or stool (black or red color)  · Shortness of breath  · Feeling that your heart is fluttering or beating fast or hard  (palpitations)  · Passing out or seizure  · Trouble walking or speaking  [employer] last reviewed this educational content on 05/11/2018  © 2000-2020 [employer] [address], [place] [address]. All rights reserved. This information is not intended as a substitute for professional medical care. Always follow your healthcare professional's instructions.

## 2019-04-15 NOTE — Progress Notes (Signed)
[facility]  [address]  [place], [place], [address]  [phone]  04/15/2019     Patient:   [name]                                                  CSN:        [id number]                                          DOB:       22-Feb-1961                                                    MRN:        [id number]     Chief Complaint   Patient presents with    Follow-up     4 wk     SUBJECTIVE     History was provided by the patient    HPI: Patient is a 59 y.o. female who presents today to follow-up of blood pressure and elbow problems.     Patient states that she did go to the emergency room after her last visit and she was found to have fractures of the distal ends of her left humerus and left radius.  She had surgery to fix them on December 29.   patient reported that she had 14 screws and 2 plates placed in order to fix her elbow    Patient states that she has a follow-up appointment with orthopedics on January 13 and may start physical therapy soon.   Patient reports that her orthopedic surgeon has prescribed her hydrocodone to help with postsurgical pain.  The hydrocodone has been causing some nausea as well as mild constipation.  Patient requests a refill of Zofran.  Constipation is currently controlled with over-the-counter medications   she states that she still does not have sensation in her left fourth and fifth digit since her fall.  She was told that she may never be able to fully extend that arm  Patient states that she has not drank any alcohol since her last visit because of her fall.  She and her roommate have made a pact not to drink anymore.  She has an appointment with [facility] gastroenterology on March 19    Patient denies having any more falls since her last visit.  Patient reports having intermittent dizziness since her last visit.  She  was told by her orthopedic surgeon not to take the lisinopril 5 mg when she does feel dizzy.  Patient states that she has been able to tolerate the lisinopril 5 mg a day when she is not feeling dizzy but she does not take it every day.    Patient wanted to review her labs done on December 29 which showed anemia as well as hyponatremia.  Patient reports a history of chronic hyponatremia.  She states that she eats a lot of salt and does not know why her salt levels have been low    Patient is prescribed Lasix for weight gain and lower extremity edema but  states that she has not needed to take it in a while.        Past Medical History:   Diagnosis Date    Asthma without status asthmaticus     Disorder of liver     multifocal cystic irregularities    DVT (deep venous thrombosis)     Gastroesophageal reflux disease     H/O ETOH abuse     sober 2 years until 03/2014    Hypertension     Pancreatitis      Past Surgical History:   Procedure Laterality Date    CHOLECYSTECTOMY      COLONOSCOPY  12/26/2012    Procedure: COLONOSCOPY;  Surgeon: Gwenith Spitz, MD;  Location: [facility];  Service: Gastroenterology;  Laterality: N/A;    COLONOSCOPY, POLYPECTOMY  12/26/2012    Procedure: COLONOSCOPY, POLYPECTOMY;  Surgeon: Gwenith Spitz, MD;  Location: [facility];  Service: Gastroenterology;  Laterality: N/A;    DEBRIDEMENT & IRRIGATION, WOUND CLOSURE Right 08/29/2018    Procedure: WASHOUT AND CLOSURE OF TRAUMATIC WOUND, RIGHT FOREARM;  Surgeon: Arnoldo Hooker, MD;  Location: [facility];  Service: General;  Laterality: Right;  Right forearm I&D poss. wound closure    ORIF, HUMERUS, DISTAL Left 04/08/2019    Procedure: ORIF, HUMERUS, DISTAL;  Surgeon: Randolm Idol, MD;  Location: [facility];  Service: Orthopedics;  Laterality: Left;  ORIF LEFT DISTAL HEMERUS    PANCREATECTOMY  01/2011    partial    THORACENTESIS Right 2012    pl effusion     Family History   Adopted: Yes     Social  History     Social History Narrative    Not on file     Allergies   Allergen Reactions    Norvasc [Amlodipine] Other (See Comments)     Unsteady on feet    Morphine Hallucinations    Oxycodone Headaches     severe     Outpatient Medications Marked as Taking for the 04/15/19 encounter (Office Visit) with Penny Pia, MD   Medication Sig Dispense Refill    albuterol (PROVENTIL HFA;VENTOLIN HFA) 108 (90 Base) MCG/ACT inhaler Inhale 2 puffs into the lungs every 6 (six) hours as needed for Wheezing      Cholecalciferol (Vitamin D3) 1.25 MG (50000 UT) Cap Take 1 capsule by mouth once a week 12 capsule 0    esomeprazole (NexIUM) 40 MG capsule Take 1 capsule (40 mg total) by mouth daily 90 capsule 1    fluticasone (Flonase) 50 MCG/ACT nasal spray 1 spray by Nasal route daily 1 Bottle 4    folic acid (FOLVITE) 1 MG tablet Take 1 tablet (1 mg total) by mouth daily 90 tablet 1    furosemide (LASIX) 40 MG tablet Take 1 tablet (40 mg total) by mouth daily as needed (swelling in feet or weight gain) 30 tablet 5    KLOR-CON M 20 MEQ tablet TAKE 1 TABLET BY MOUTH THREE TIMES DAILY ONLY ON DAYS YOU TAKE LASIX 270 tablet 1    lisinopril (ZESTRIL) 5 MG tablet Take 1 tablet (5 mg total) by mouth daily 30 tablet 2    magnesium oxide (MAG-OX) 400 MG tablet Take 2 tablets (800 mg total) by mouth 3 (three) times daily with meals 360 tablet 1    Multiple Vitamins-Minerals (MULTIVITAMIN WITH MINERALS) tablet Take 1 tablet by mouth every morning.         naloxone (NARCAN) 4 MG/0.1ML nasal spray 1 spray intranasally.  If pt does not respond or relapses into respiratory depression call 911. Give additional doses every 2-3 min. 2 each 0    oxyCODONE-acetaminophen (PERCOCET) 5-325 MG per tablet Take 1-2 tablets by mouth every 4 (four) hours as needed for Pain 30 tablet 0    Pancrelipase, Lip-Prot-Amyl, (CREON PO) Take 24,000 Unit by mouth 3 (three) times daily with meals      sodium bicarbonate 650 MG tablet Take 1 tablet  (650 mg total) by mouth 3 (three) times daily 90 tablet 5    spironolactone (ALDACTONE) 50 MG tablet Take 1 tablet (50 mg total) by mouth daily Pt take one q day 30 tablet 5    thiamine (B-1) 100 MG tablet Take 1 tablet (100 mg total) by mouth daily 30 tablet 5    [DISCONTINUED] ondansetron (ZOFRAN-ODT) 4 MG disintegrating tablet Take 1 tablet (4 mg total) by mouth every 12 (twelve) hours as needed for Nausea 20 tablet 0       Review of Systems  Review of Systems: Items that patient complains of are in bold.  Items that the patient denies are not bolded.   General: Fever, Chills.   Eyes: Discharge  Ears/Nose/Throat: Earache, Congestion, Sore Throat.   Cardiovascular: Chest Pain, Palpitations, Peripheral Edema.   Respiratory: Cough, Dyspnea. Wants to get the PNA 23 vaccine due to h/o of asthma and smoking. Pt states that she is cutting down on her many cigarettes she smokes a day.  She needs to use her albuterol once in a while for shortness of breath  Gastrointestinal: Nausea, Vomiting, Diarrhea, Constipation, chronic hemorrhoids, abdominal Pain, Melena, Hematochezia.   Genitourinary:  Dysuria, Hematuria, Discharge or Incontinence.  Musculoskeletal:  Joint Pain.  Elbow pain  Skin: Rash   Neurologic: Weakness.  Dizzy, headache, numbness of fingers  Heme/Lymphatic: Abnormal Bruising.        PHYSICAL EXAM   BP 108/82 (BP Site: Right arm, Patient Position: Sitting, Cuff Size: Medium)    Pulse 77    Temp 97.4 F (36.3 C) (Temporal)    Resp 16    Wt 68.9 kg (152 lb)    SpO2 98%    BMI 25.29 kg/m     Physical Exam    Constitutional: Well developed, well nourished, active, in no apparent distress  HENT:   Head: Normocephalic, atraumatic  Ears: No external lesions.  Nose: No external lesions. No epistaxis or drainage.  Eyes: PERRL. No scleral icterus. No conjunctival injection. EOMI.  Neck: Trachea is midline. No JVD. Normal range of motion. No apparent masses.  Cardiovascular: Regular rhythm, S1 normal and S2 normal.     No murmur heard.  Pulmonary/Chest: Effort normal. Lungs clear to auscultation bilaterally.   Abdominal: Soft, non-tender, non-distended. No masses.   Genitourinay/Anorectal: Defferred  Musculoskeletal: Normal range of motion. Gait normal.left elbow incision is noted. Pt is holding left elbow in a flexed position  Neurological: Pt is alert. Cranial nerves are grossly intact. Moving all extremities without apparent deficit.   Psychiatric: Affect is appropriate. There is no agitation.   Skin: Skin is warm, dry, well perfused. No rash noted. No cyanosis. No pallor. No apparent wound.      LABS     Lab Results   Component Value Date    CHOL 172 10/15/2018    HDL 70 (H) 10/15/2018    TRIG 97 10/15/2018    NA 133 (L) 04/08/2019    K 4.6 04/08/2019    BUN 16 04/08/2019    GLU 105 (  H) 04/08/2019    CA 10.0 04/08/2019    AST 104 (H) 12/05/2018    ALT 43 (H) 12/05/2018    ALKPHOS 174 (H) 12/05/2018    TSH 0.754 12/05/2018         UPDATED IMMUNIZATIONS     Immunization History   Administered Date(s) Administered    Influenza quad 6 MOS to 64 YRS (Flulaval/Fluarix) 01/29/2019    Influenza quadrivalent (IM) PF 3 Yrs & greater 12/26/2017    Pneumococcal 23 valent 04/15/2019    Tdap 02/28/2018    Zoster [facility]) Vaccine Recombinant 02/07/2019         ASSESSMENT and PLAN     1. Essential hypertension     2. Dizziness     3. Hyponatremia  Comprehensive metabolic panel   4. Mild intermittent asthma without complication  Pneumococcal polysaccharide vaccine 23-valent greater than or equal to 2yo subcutaneous/IM   5. Anemia, unspecified type  CBC and differential   6. History of fall     7. Closed fracture dislocation of left elbow with routine healing, subsequent encounter     8. Alcohol use disorder, severe, in early remission     9. S/P ORIF (open reduction internal fixation) fracture of left humerus     10. Numbness of 4th and 5th fingers of left hand     11. Tobacco use disorder  Pneumococcal polysaccharide vaccine 23-valent  greater than or equal to 2yo subcutaneous/IM   12. Immunization due- need pna 23 vaccine       Labs were done today to further assess anemia and hyponatremia  Patient hyponatremia might be due to her liver problems versus Lasix.  She is advised to continue to hold lisinopril if she feels dizzy and to continue to monitor her blood pressure at home.  Contact the office if your symptoms worsen  ED precautions were given  Continue to follow-up with your specialist  Patient congratulated for quitting alcohol use.  He is advised to consider doing AA meetings to help her maintain her sobriety        Follow up in 8 weeks for f/u of Dizziness, HTN and pain    Penny Pia, MD

## 2019-04-16 LAB — CBC AND DIFFERENTIAL
Baso(Absolute): 0 10*3/uL (ref 0.0–0.2)
Basos: 1 %
Eos: 2 %
Eosinophils Absolute: 0.1 10*3/uL (ref 0.0–0.4)
Hematocrit: 32.1 % — ABNORMAL LOW (ref 34.0–46.6)
Hemoglobin: 10.4 g/dL — ABNORMAL LOW (ref 11.1–15.9)
Immature Granulocytes Absolute: 0.1 10*3/uL (ref 0.0–0.1)
Immature Granulocytes: 2 %
Lymphocytes Absolute: 1.9 10*3/uL (ref 0.7–3.1)
Lymphocytes: 30 %
MCH: 30.5 pg (ref 26.6–33.0)
MCHC: 32.4 g/dL (ref 31.5–35.7)
MCV: 94 fL (ref 79–97)
Monocytes Absolute: 0.6 10*3/uL (ref 0.1–0.9)
Monocytes: 10 %
Neutrophils Absolute: 3.5 10*3/uL (ref 1.4–7.0)
Neutrophils: 55 %
Platelets: 393 10*3/uL (ref 150–450)
RBC: 3.41 x10E6/uL — ABNORMAL LOW (ref 3.77–5.28)
RDW: 15.4 % (ref 11.7–15.4)
WBC: 6.3 10*3/uL (ref 3.4–10.8)

## 2019-04-16 LAB — COMPREHENSIVE METABOLIC PANEL
ALT: 37 IU/L — ABNORMAL HIGH (ref 0–32)
AST (SGOT): 63 IU/L — ABNORMAL HIGH (ref 0–40)
Albumin/Globulin Ratio: 1.4 (ref 1.2–2.2)
Albumin: 4.4 g/dL (ref 3.8–4.9)
Alkaline Phosphatase: 266 IU/L — ABNORMAL HIGH (ref 39–117)
BUN / Creatinine Ratio: 19 (ref 9–23)
BUN: 19 mg/dL (ref 6–24)
Bilirubin, Total: 0.5 mg/dL (ref 0.0–1.2)
CO2: 22 mmol/L (ref 20–29)
Calcium: 10.2 mg/dL (ref 8.7–10.2)
Chloride: 100 mmol/L (ref 96–106)
Creatinine: 1 mg/dL (ref 0.57–1.00)
EGFR: 62 mL/min/{1.73_m2} (ref >59)
EGFR: 72 mL/min/{1.73_m2} (ref >59)
Globulin, Total: 3.1 g/dL (ref 1.5–4.5)
Glucose: 119 mg/dL — ABNORMAL HIGH (ref 65–99)
Potassium: 5.4 mmol/L — ABNORMAL HIGH (ref 3.5–5.2)
Protein, Total: 7.5 g/dL (ref 6.0–8.5)
Sodium: 134 mmol/L (ref 134–144)

## 2019-04-21 ENCOUNTER — Other Ambulatory Visit (INDEPENDENT_AMBULATORY_CARE_PROVIDER_SITE_OTHER): Payer: Self-pay | Admitting: Family

## 2019-04-21 DIAGNOSIS — I10 Essential (primary) hypertension: Secondary | ICD-10-CM

## 2019-04-21 DIAGNOSIS — Z760 Encounter for issue of repeat prescription: Secondary | ICD-10-CM

## 2019-04-22 ENCOUNTER — Other Ambulatory Visit (INDEPENDENT_AMBULATORY_CARE_PROVIDER_SITE_OTHER): Payer: Self-pay | Admitting: Orthopaedic Surgery

## 2019-04-22 DIAGNOSIS — S42402A Unspecified fracture of lower end of left humerus, initial encounter for closed fracture: Secondary | ICD-10-CM

## 2019-04-23 ENCOUNTER — Ambulatory Visit
Admission: RE | Admit: 2019-04-23 | Discharge: 2019-04-23 | Disposition: A | Discharge status: Home / Self Care | Payer: 59 | Source: Ambulatory Visit | Attending: Orthopaedic Surgery | Admitting: Orthopaedic Surgery

## 2019-04-23 ENCOUNTER — Ambulatory Visit (INDEPENDENT_AMBULATORY_CARE_PROVIDER_SITE_OTHER): Payer: 59 | Admitting: Orthopaedic Surgery

## 2019-04-23 ENCOUNTER — Encounter (INDEPENDENT_AMBULATORY_CARE_PROVIDER_SITE_OTHER): Payer: Self-pay | Admitting: Orthopaedic Surgery

## 2019-04-23 DIAGNOSIS — M19022 Primary osteoarthritis, left elbow: Secondary | ICD-10-CM | POA: Insufficient documentation

## 2019-04-23 DIAGNOSIS — Z967 Presence of other bone and tendon implants: Secondary | ICD-10-CM

## 2019-04-23 DIAGNOSIS — S42402D Unspecified fracture of lower end of left humerus, subsequent encounter for fracture with routine healing: Secondary | ICD-10-CM

## 2019-04-23 DIAGNOSIS — S42402A Unspecified fracture of lower end of left humerus, initial encounter for closed fracture: Secondary | ICD-10-CM | POA: Insufficient documentation

## 2019-04-24 NOTE — Progress Notes (Signed)
Ortho Trauma    S:  No complaints.  Has been working on ROM.  Notes parasthesias in left small finger    O:  AVSS        NAD        LUE - wound well healed                  ROM 15 -120                  NVI at hand    X-rays left elbow today show stable hardware and stable fracture alignment    A:  2 weeks s/p ORIF left distal humerus fracture    P: 1.  NWB LUE   2.  Continue ROM exercise   3.  Follow-up 4 weeks      Montel Culver MD  [facility] Orthopaedic Trauma

## 2019-05-06 ENCOUNTER — Encounter (INDEPENDENT_AMBULATORY_CARE_PROVIDER_SITE_OTHER): Payer: Self-pay

## 2019-05-16 ENCOUNTER — Other Ambulatory Visit (INDEPENDENT_AMBULATORY_CARE_PROVIDER_SITE_OTHER): Payer: Self-pay | Admitting: Medical

## 2019-05-16 DIAGNOSIS — S42402D Unspecified fracture of lower end of left humerus, subsequent encounter for fracture with routine healing: Secondary | ICD-10-CM

## 2019-05-19 ENCOUNTER — Observation Stay
Admission: EM | Admit: 2019-05-19 | Discharge: 2019-05-20 | Disposition: A | Discharge status: Home / Self Care | Payer: Medicare Other | Attending: Emergency Medicine | Admitting: Emergency Medicine

## 2019-05-19 ENCOUNTER — Emergency Department: Payer: Medicare Other

## 2019-05-19 ENCOUNTER — Ambulatory Visit (INDEPENDENT_AMBULATORY_CARE_PROVIDER_SITE_OTHER): Payer: 59

## 2019-05-19 DIAGNOSIS — K219 Gastro-esophageal reflux disease without esophagitis: Secondary | ICD-10-CM | POA: Insufficient documentation

## 2019-05-19 DIAGNOSIS — R072 Precordial pain: Principal | ICD-10-CM | POA: Insufficient documentation

## 2019-05-19 DIAGNOSIS — Z86718 Personal history of other venous thrombosis and embolism: Secondary | ICD-10-CM | POA: Insufficient documentation

## 2019-05-19 DIAGNOSIS — Z90411 Acquired partial absence of pancreas: Secondary | ICD-10-CM | POA: Insufficient documentation

## 2019-05-19 DIAGNOSIS — Z86010 Personal history of colonic polyps: Secondary | ICD-10-CM | POA: Insufficient documentation

## 2019-05-19 DIAGNOSIS — J45909 Unspecified asthma, uncomplicated: Secondary | ICD-10-CM | POA: Insufficient documentation

## 2019-05-19 DIAGNOSIS — R079 Chest pain, unspecified: Secondary | ICD-10-CM | POA: Diagnosis present

## 2019-05-19 DIAGNOSIS — F1020 Alcohol dependence, uncomplicated: Secondary | ICD-10-CM | POA: Insufficient documentation

## 2019-05-19 DIAGNOSIS — K860 Alcohol-induced chronic pancreatitis: Secondary | ICD-10-CM | POA: Insufficient documentation

## 2019-05-19 DIAGNOSIS — F17210 Nicotine dependence, cigarettes, uncomplicated: Secondary | ICD-10-CM | POA: Insufficient documentation

## 2019-05-19 DIAGNOSIS — Z885 Allergy status to narcotic agent status: Secondary | ICD-10-CM | POA: Insufficient documentation

## 2019-05-19 DIAGNOSIS — Z9049 Acquired absence of other specified parts of digestive tract: Secondary | ICD-10-CM | POA: Insufficient documentation

## 2019-05-19 DIAGNOSIS — I10 Essential (primary) hypertension: Secondary | ICD-10-CM | POA: Insufficient documentation

## 2019-05-19 DIAGNOSIS — S0990XA Unspecified injury of head, initial encounter: Secondary | ICD-10-CM | POA: Insufficient documentation

## 2019-05-19 DIAGNOSIS — W1830XA Fall on same level, unspecified, initial encounter: Secondary | ICD-10-CM | POA: Insufficient documentation

## 2019-05-19 DIAGNOSIS — R0789 Other chest pain: Secondary | ICD-10-CM

## 2019-05-19 LAB — ECG 12-LEAD
P Wave Axis: 75 deg
P-R Interval: 201 ms
Patient Age: 58 years
Q-T Interval(Corrected): 479 ms
Q-T Interval: 377 ms
QRS Axis: 41 deg
QRS Duration: 96 ms
T Axis: 49 years
Ventricular Rate: 97 //min

## 2019-05-19 LAB — TROPONIN I
Troponin I: 0.01 ng/mL (ref 0.00–0.02)
Troponin I: 0.01 ng/mL (ref 0.00–0.02)
Troponin I: 0.01 ng/mL (ref 0.00–0.02)

## 2019-05-19 LAB — CBC AND DIFFERENTIAL
Basophils %: 0.8 % (ref 0.0–3.0)
Basophils Absolute: 0 10*3/uL (ref 0.0–0.3)
Eosinophils %: 0.5 % (ref 0.0–7.0)
Eosinophils Absolute: 0 10*3/uL (ref 0.0–0.8)
Hematocrit: 35.1 % — ABNORMAL LOW (ref 36.0–48.0)
Hemoglobin: 11.4 gm/dL — ABNORMAL LOW (ref 12.0–16.0)
Lymphocytes Absolute: 1.1 10*3/uL (ref 0.6–5.1)
Lymphocytes: 18.5 % (ref 15.0–46.0)
MCH: 31 pg (ref 28–35)
MCHC: 33 gm/dL (ref 32–36)
MCV: 95 fL (ref 80–100)
MPV: 6.5 fL (ref 6.0–10.0)
Monocytes Absolute: 0.5 10*3/uL (ref 0.1–1.7)
Monocytes: 9.5 % (ref 3.0–15.0)
Neutrophils %: 70.8 % (ref 42.0–78.0)
Neutrophils Absolute: 4 10*3/uL (ref 1.7–8.6)
PLT CT: 208 10*3/uL (ref 130–440)
RBC: 3.68 10*6/uL — ABNORMAL LOW (ref 3.80–5.00)
RDW: 15.5 % — ABNORMAL HIGH (ref 11.0–14.0)
WBC: 5.7 10*3/uL (ref 4.0–11.0)

## 2019-05-19 LAB — BASIC METABOLIC PANEL
Anion Gap: 18.9 mMol/L — ABNORMAL HIGH (ref 7.0–18.0)
BUN / Creatinine Ratio: 7.4 Ratio — ABNORMAL LOW (ref 10.0–30.0)
BUN: 7 mg/dL (ref 7–22)
CO2: 21 mMol/L (ref 20–30)
Calcium: 10 mg/dL (ref 8.5–10.5)
Chloride: 99 mMol/L (ref 98–110)
Creatinine: 0.94 mg/dL (ref 0.60–1.20)
EGFR: 67 mL/min/{1.73_m2} (ref 60–150)
Glucose: 155 mg/dL — ABNORMAL HIGH (ref 71–99)
Osmolality Calculated: 271 mOsm/kg — ABNORMAL LOW (ref 275–300)
Potassium: 3.9 mMol/L (ref 3.5–5.3)
Sodium: 135 mMol/L — ABNORMAL LOW (ref 136–147)

## 2019-05-19 LAB — HEPATIC FUNCTION PANEL
ALT: 30 U/L (ref 0–55)
AST (SGOT): 54 U/L — ABNORMAL HIGH (ref 10–42)
Albumin/Globulin Ratio: 1.1 Ratio (ref 0.80–2.00)
Albumin: 4.5 gm/dL (ref 3.5–5.0)
Alkaline Phosphatase: 188 U/L — ABNORMAL HIGH (ref 40–145)
Bilirubin Direct: 0.5 mg/dL — ABNORMAL HIGH (ref 0.0–0.3)
Bilirubin, Total: 1.3 mg/dL — ABNORMAL HIGH (ref 0.1–1.2)
Globulin: 4.1 gm/dL — ABNORMAL HIGH (ref 2.0–4.0)
Protein, Total: 8.6 gm/dL — ABNORMAL HIGH (ref 6.0–8.3)

## 2019-05-19 LAB — LIPASE: Lipase: 47 U/L (ref 8–78)

## 2019-05-19 MED ORDER — KETOROLAC TROMETHAMINE 15 MG/ML IJ SOLN
15.00 mg | Freq: Four times a day (QID) | INTRAMUSCULAR | Status: DC | PRN
Start: 2019-05-19 — End: 2019-05-20
  Administered 2019-05-19 – 2019-05-20 (×2): 15 mg via INTRAVENOUS
  Filled 2019-05-19 (×2): qty 1

## 2019-05-19 MED ORDER — SODIUM CHLORIDE (PF) 0.9 % IJ SOLN
3.00 mL | Freq: Three times a day (TID) | INTRAMUSCULAR | Status: DC
Start: 2019-05-19 — End: 2019-05-20
  Administered 2019-05-19 – 2019-05-20 (×3): 3 mL via INTRAVENOUS

## 2019-05-19 MED ORDER — NITROGLYCERIN 0.4 MG SL SUBL
0.40 mg | SUBLINGUAL_TABLET | SUBLINGUAL | Status: DC | PRN
Start: 2019-05-19 — End: 2019-05-20

## 2019-05-19 MED ORDER — NALOXONE HCL 0.4 MG/ML IJ SOLN (WRAP)
0.40 mg | INTRAMUSCULAR | Status: DC | PRN
Start: 2019-05-19 — End: 2019-05-20

## 2019-05-19 MED ORDER — ONDANSETRON 4 MG PO TBDP
4.00 mg | ORAL_TABLET | Freq: Three times a day (TID) | ORAL | Status: DC | PRN
Start: 2019-05-19 — End: 2019-05-20

## 2019-05-19 MED ORDER — VH VITAMIN B-1 100 MG PO TABS (WRAP)
100.0000 mg | ORAL_TABLET | Freq: Every day | ORAL | Status: DC
Start: 2019-05-19 — End: 2019-05-20
  Administered 2019-05-19 – 2019-05-20 (×2): 100 mg via ORAL
  Filled 2019-05-19 (×2): qty 1

## 2019-05-19 MED ORDER — LORAZEPAM 2 MG/ML IJ SOLN
1.00 mg | Freq: Once | INTRAMUSCULAR | Status: AC
Start: 2019-05-19 — End: 2019-05-19
  Administered 2019-05-19: 09:00:00 1 mg via INTRAVENOUS

## 2019-05-19 MED ORDER — FUROSEMIDE 40 MG PO TABS
40.0000 mg | ORAL_TABLET | Freq: Every day | ORAL | Status: DC | PRN
Start: 2019-05-19 — End: 2019-05-20

## 2019-05-19 MED ORDER — PHENOBARBITAL 32.4 MG PO TABS
64.8000 mg | ORAL_TABLET | Freq: Three times a day (TID) | ORAL | Status: DC
Start: 2019-05-19 — End: 2019-05-20
  Administered 2019-05-19 – 2019-05-20 (×4): 64.8 mg via ORAL
  Filled 2019-05-19 (×5): qty 2

## 2019-05-19 MED ORDER — LORAZEPAM 1 MG PO TABS
2.0000 mg | ORAL_TABLET | ORAL | Status: DC | PRN
Start: 2019-05-19 — End: 2019-05-20

## 2019-05-19 MED ORDER — PHENOBARBITAL SODIUM 65 MG/ML IJ SOLN
260.00 mg | Freq: Once | INTRAMUSCULAR | Status: AC
Start: 2019-05-19 — End: 2019-05-19
  Administered 2019-05-19: 09:00:00 260 mg via INTRAVENOUS

## 2019-05-19 MED ORDER — PANCRELIPASE (LIP-PROT-AMYL) 24000-76000 UNITS PO CPEP
72000.00 [IU] | ORAL_CAPSULE | Freq: Three times a day (TID) | ORAL | Status: DC
Start: 2019-05-19 — End: 2019-05-20
  Administered 2019-05-19 – 2019-05-20 (×2): 72000 [IU] via ORAL
  Filled 2019-05-19 (×4): qty 3

## 2019-05-19 MED ORDER — VH SPIRONOLACTONE 25 MG PO TABS
50.0000 mg | ORAL_TABLET | Freq: Every morning | ORAL | Status: DC
Start: 2019-05-19 — End: 2019-05-20
  Administered 2019-05-19 – 2019-05-20 (×2): 50 mg via ORAL
  Filled 2019-05-19 (×2): qty 2

## 2019-05-19 MED ORDER — FAMOTIDINE 20 MG PO TABS
40.0000 mg | ORAL_TABLET | Freq: Two times a day (BID) | ORAL | Status: DC
Start: 2019-05-19 — End: 2019-05-20
  Administered 2019-05-19 – 2019-05-20 (×2): 40 mg via ORAL
  Filled 2019-05-19 (×2): qty 2

## 2019-05-19 MED ORDER — HYDROCODONE-ACETAMINOPHEN 5-325 MG PO TABS
1.0000 | ORAL_TABLET | ORAL | Status: DC | PRN
Start: 2019-05-19 — End: 2019-05-20
  Administered 2019-05-19 – 2019-05-20 (×4): 1 via ORAL
  Filled 2019-05-19 (×4): qty 1

## 2019-05-19 MED ORDER — LISINOPRIL 10 MG PO TABS
10.0000 mg | ORAL_TABLET | Freq: Every day | ORAL | Status: DC
Start: 2019-05-19 — End: 2019-05-20
  Administered 2019-05-19: 15:00:00 10 mg via ORAL
  Filled 2019-05-19 (×2): qty 1

## 2019-05-19 MED ORDER — ONDANSETRON HCL 4 MG/2ML IJ SOLN
4.00 mg | Freq: Three times a day (TID) | INTRAMUSCULAR | Status: DC | PRN
Start: 2019-05-19 — End: 2019-05-20
  Administered 2019-05-19 – 2019-05-20 (×2): 4 mg via INTRAVENOUS
  Filled 2019-05-19 (×2): qty 2

## 2019-05-19 MED ORDER — PHENOBARBITAL SODIUM 65 MG/ML IJ SOLN
INTRAMUSCULAR | Status: AC
Start: 2019-05-19 — End: ?
  Filled 2019-05-19: qty 4

## 2019-05-19 MED ORDER — ACETAMINOPHEN 325 MG PO TABS
650.0000 mg | ORAL_TABLET | Freq: Four times a day (QID) | ORAL | Status: DC | PRN
Start: 2019-05-19 — End: 2019-05-20

## 2019-05-19 MED ORDER — LORAZEPAM 2 MG/ML IJ SOLN
INTRAMUSCULAR | Status: AC
Start: 2019-05-19 — End: ?
  Filled 2019-05-19: qty 1

## 2019-05-19 MED ORDER — FOLIC ACID 1 MG PO TABS
1.0000 mg | ORAL_TABLET | Freq: Every day | ORAL | Status: DC
Start: 2019-05-20 — End: 2019-05-20
  Administered 2019-05-20: 12:00:00 1 mg via ORAL
  Filled 2019-05-19: qty 1

## 2019-05-19 MED ORDER — SODIUM CHLORIDE 0.9 % IV BOLUS
1000.00 mL | Freq: Once | INTRAVENOUS | Status: AC
Start: 2019-05-19 — End: 2019-05-19
  Administered 2019-05-19: 09:00:00 1000 mL via INTRAVENOUS

## 2019-05-19 MED ORDER — ASPIRIN 81 MG PO CHEW
81.00 mg | CHEWABLE_TABLET | Freq: Every day | ORAL | Status: DC
Start: 2019-05-19 — End: 2019-05-20
  Administered 2019-05-19 – 2019-05-20 (×2): 81 mg via ORAL
  Filled 2019-05-19 (×2): qty 1

## 2019-05-19 MED ORDER — SODIUM CHLORIDE 0.9 % IV SOLN
INTRAVENOUS | Status: AC
Start: 2019-05-19 — End: 2019-05-20

## 2019-05-19 MED ORDER — LORAZEPAM 1 MG PO TABS
4.0000 mg | ORAL_TABLET | ORAL | Status: DC | PRN
Start: 2019-05-19 — End: 2019-05-20

## 2019-05-19 MED ORDER — MAGNESIUM OXIDE 400 MG TABS (WRAP)
800.0000 mg | ORAL_TABLET | Freq: Three times a day (TID) | ORAL | Status: DC
Start: 2019-05-19 — End: 2019-05-20
  Administered 2019-05-19: 18:00:00 800 mg via ORAL
  Filled 2019-05-19 (×4): qty 2

## 2019-05-19 MED ORDER — IOHEXOL 350 MG/ML IV SOLN
65.00 mL | Freq: Once | INTRAVENOUS | Status: AC | PRN
Start: 2019-05-19 — End: 2019-05-19
  Administered 2019-05-19: 65 mL via INTRAVENOUS

## 2019-05-19 NOTE — Consults (Signed)
Consult for SBIRT  Consult performed by: Lennox Grumbles, [facility]  Consult ordered by: Donalynn Furlong, MD  Assessment/Recommendations:     Patients AUDIT Score was 35, placing her in the high risk zone.  In discussing this issue, patient shared that her drinking has increased since her recent surgery as she was in pain and the painkillers were not helping much. Patient identified cost as a negative of her alcohol use. Patient is highly motivated to stop drinking and was asking to go to detox. Clinician was arranging detox until it was identified that patient was being admitted. Clinician will contact social work to continue to help patient with admission to detox.     Tobacco Screen was positive.  Patient reported that she has been smoking for 25 years, and is currently smoking 1/2 ppd, which is a 50% decrease from one year ago. Patient stated she is not currently motivated to stop smoking as she would like to stop drinking first.

## 2019-05-19 NOTE — H&P (Signed)
[facility]  OBSERVATION UNIT  HISTORY AND PHYSICAL       Patient: [name]  Admission Date: 05/19/2019    DOB: [birth year]  Age: 59 y.o.    MRN: [id number]  Sex: female    PCP: Penny Pia, MD  Attending: Donalynn Furlong, MD         HISTORY OF PRESENT ILLNESS     [name] is a 59 y.o. female who presented with cute onset of substernal chest pain and pressure which began 3:00 this morning.  It awoke her from sleep.  Patient has no known history of coronary disease.  She is a chronic alcoholic.  She is not drink for 2 days and feels shaky.  She was given 260 mg of phenobarbital in the ER along with IV Ativan.  Her initial EKG shows no acute changes and her initial troponin is negative.  She had fallen a few days ago with left ecchymosis around the eye.  Her CAT scan in the emergency department was unremarkable.  CT angio of her chest was negative.  She was sent to the observation unit for further cardiac evaluation.  Rest of the review of systems are negative.    PAST MEDICAL HISTORY     Code Status: Prior    Past Medical History:   Diagnosis Date    Asthma without status asthmaticus     Disorder of liver     multifocal cystic irregularities    DVT (deep venous thrombosis)     Gastroesophageal reflux disease     H/O ETOH abuse     sober 2 years until 03/2014    Hypertension     Pancreatitis        Past Surgical History:   Procedure Laterality Date    CHOLECYSTECTOMY      COLONOSCOPY  12/26/2012    Procedure: COLONOSCOPY;  Surgeon: Gwenith Spitz, MD;  Location: [facility];  Service: Gastroenterology;  Laterality: N/A;    COLONOSCOPY, POLYPECTOMY  12/26/2012    Procedure: COLONOSCOPY, POLYPECTOMY;  Surgeon: Gwenith Spitz, MD;  Location: [facility];  Service: Gastroenterology;  Laterality: N/A;    DEBRIDEMENT & IRRIGATION, WOUND CLOSURE Right 08/29/2018    Procedure: WASHOUT AND CLOSURE OF TRAUMATIC WOUND, RIGHT FOREARM;  Surgeon: Arnoldo Hooker,  MD;  Location: [facility];  Service: General;  Laterality: Right;  Right forearm I&D poss. wound closure    ORIF, HUMERUS, DISTAL Left 04/08/2019    Procedure: ORIF, HUMERUS, DISTAL;  Surgeon: Randolm Idol, MD;  Location: [facility];  Service: Orthopedics;  Laterality: Left;  ORIF LEFT DISTAL HEMERUS    PANCREATECTOMY  01/2011    partial    THORACENTESIS Right 2012    pl effusion       Current/Home Medications    ALBUTEROL (PROVENTIL HFA;VENTOLIN HFA) 108 (90 BASE) MCG/ACT INHALER    Inhale 2 puffs into the lungs every 6 (six) hours as needed for Wheezing    CHOLECALCIFEROL (VITAMIN D3) 1.25 MG (50000 UT) CAP    Take 1 capsule by mouth once a week    FLUTICASONE (FLONASE) 50 MCG/ACT NASAL SPRAY    1 spray by Nasal route daily as needed (allergies)    FOLIC ACID (FOLVITE) 1 MG TABLET    Take 1 mg by mouth every morning    FUROSEMIDE (LASIX) 40 MG TABLET    Take 1 tablet (40 mg total) by mouth daily as needed (swelling in feet or weight gain)  LISINOPRIL (ZESTRIL) 5 MG TABLET    Take 5 mg by mouth every morning    MAGNESIUM OXIDE (MAG-OX) 400 MG TABLET    Take 2 tablets (800 mg total) by mouth 3 (three) times daily with meals    MULTIPLE VITAMINS-MINERALS (MULTIVITAMIN WITH MINERALS) TABLET    Take 1 tablet by mouth every morning.       ONDANSETRON (ZOFRAN-ODT) 4 MG DISINTEGRATING TABLET    Take 1 tablet (4 mg total) by mouth every 12 (twelve) hours as needed for Nausea    PANCRELIPASE, LIP-PROT-AMYL, (CREON) 24000-76000 UNITS CAP DR PARTICLES    Take 72,000 units of lipase by mouth 3 (three) times daily with meals Take 3 capsules (72000 units of lipase) 3 times daily with meals    PANTOPRAZOLE (PROTONIX) 40 MG TABLET    Take 40 mg by mouth every morning    SODIUM BICARBONATE 650 MG TABLET    Take 1 tablet (650 mg total) by mouth 3 (three) times daily    SPIRONOLACTONE (ALDACTONE) 50 MG TABLET    Take 50 mg by mouth every morning    THIAMINE (B-1) 100 MG TABLET    Take 100 mg by mouth every  morning       Allergies:   Allergies   Allergen Reactions    Norvasc [Amlodipine] Other (See Comments)     Unsteady on feet    Morphine Hallucinations    Oxycodone Headaches     severe       Family History   Adopted: Yes       SHx:  reports that she has been smoking cigarettes. She has a 7.50 pack-year smoking history. She has never used smokeless tobacco. She reports current alcohol use. She reports that she does not use drugs.    REVIEW OF SYSTEMS     See above  PHYSICAL EXAM     Vital Signs: Blood pressure (!) 168/108, pulse 86, temperature 97.9 F (36.6 C), resp. rate 14, height 1.651 m, weight 68.9 kg, SpO2 96 %.    Physical Examination: General appearance - alert, well appearing, and in no distress  Mental status - alert, oriented to person, place, and time  Eyes - pupils equal and reactive, extraocular eye movements intact, patient has ecchymosis and bruising around the left eye.  No evidence of battle sign  Chest - clear to auscultation, no wheezes, rales or rhonchi, symmetric air entry  Heart - normal rate, regular rhythm, normal S1, S2, no murmurs, rubs, clicks or gallops  Abdomen - soft, nontender, nondistended, no masses or organomegaly  Neurological - alert, oriented, normal speech, no focal findings or movement disorder noted, patient has a fine resting tremor  Musculoskeletal - no joint tenderness, deformity or swelling  Extremities - peripheral pulses normal, no pedal edema, no clubbing or cyanosis    LABS & IMAGING     Labs:  Results for orders placed or performed during the hospital encounter of 05/19/19   CBC and differential   Result Value Ref Range    WBC 5.7 4.0 - 11.0 K/cmm    RBC 3.68 (L) 3.80 - 5.00 M/cmm    Hemoglobin 11.4 (L) 12.0 - 16.0 gm/dL    Hematocrit 16.1 (L) 36.0 - 48.0 %    MCV 95 80 - 100 fL    MCH 31 28 - 35 pg    MCHC 33 32 - 36 gm/dL    RDW 09.6 (H) 04.5 - 14.0 %    PLT CT 208  130 - 440 K/cmm    MPV 6.5 6.0 - 10.0 fL    Neutrophils % 70.8 42.0 - 78.0 %    Lymphocytes 18.5  15.0 - 46.0 %    Monocytes 9.5 3.0 - 15.0 %    Eosinophils % 0.5 0.0 - 7.0 %    Basophils % 0.8 0.0 - 3.0 %    Neutrophils Absolute 4.0 1.7 - 8.6 K/cmm    Lymphocytes Absolute 1.1 0.6 - 5.1 K/cmm    Monocytes Absolute 0.5 0.1 - 1.7 K/cmm    Eosinophils Absolute 0.0 0.0 - 0.8 K/cmm    Basophils Absolute 0.0 0.0 - 0.3 K/cmm   Basic Metabolic Panel   Result Value Ref Range    Sodium 135 (L) 136 - 147 mMol/L    Potassium 3.9 3.5 - 5.3 mMol/L    Chloride 99 98 - 110 mMol/L    CO2 21 20 - 30 mMol/L    Calcium 10.0 8.5 - 10.5 mg/dL    Glucose 161 (H) 71 - 99 mg/dL    Creatinine 0.96 0.45 - 1.20 mg/dL    BUN 7 7 - 22 mg/dL    Anion Gap 40.9 (H) 7.0 - 18.0 mMol/L    BUN / Creatinine Ratio 7.4 (L) 10.0 - 30.0 Ratio    EGFR 67 60 - 150 mL/min/1.41m2    Osmolality Calculated 271 (L) 275 - 300 mOsm/kg   Troponin I   Result Value Ref Range    Troponin I 0.01 0.00 - 0.02 ng/mL   Hepatic function panel (LFT)   Result Value Ref Range    Protein, Total 8.6 (H) 6.0 - 8.3 gm/dL    Albumin 4.5 3.5 - 5.0 gm/dL    Alkaline Phosphatase 188 (H) 40 - 145 U/L    ALT 30 0 - 55 U/L    AST (SGOT) 54 (H) 10 - 42 U/L    Bilirubin, Total 1.3 (H) 0.1 - 1.2 mg/dL    Bilirubin Direct 0.5 (H) 0.0 - 0.3 mg/dL    Albumin/Globulin Ratio 1.10 0.80 - 2.00 Ratio    Globulin 4.1 (H) 2.0 - 4.0 gm/dL   Lipase   Result Value Ref Range    Lipase 47 8 - 78 U/L       Imaging:  CT Angiogram Chest (PE)   Final Result   1.  NO PE OR OTHER ACUTE CARDIOPULMONARY FINDINGS.   2.  EMPHYSEMA.      ReadingStation:[id number]      CT Head WO- (Rad read)   Final Result   NO ACUTE INTRACRANIAL FINDINGS.       ReadingStation:[id number]          EKG: Sinus rhythm; negative for ischemic changes.    EMERGENCY DEPARTMENT COURSE     ED Medication Orders (From admission, onward)    Start Ordered     Status Ordering Provider    05/19/19 1044 05/19/19 1045  iohexol (OMNIPAQUE) 350 MG/ML injection 65 mL  IMG once as needed     Route: Intravenous  Ordered Dose: 65 mL     Last MAR action: Imaging  Agent Given MCAULIFFE, MICHAEL J    05/19/19 0918 05/19/19 0917  LORazepam (ATIVAN) injection 1 mg  Once     Route: Intravenous  Ordered Dose: 1 mg     Last MAR action: Given MCAULIFFE, MICHAEL J    05/19/19 0916 05/19/19 0915  PHENobarbital injection 260 mg  Once in ED     Route: Intravenous  Ordered Dose:  260 mg     Last MAR action: Given MCAULIFFE, MICHAEL J    05/19/19 0916 05/19/19 0915  sodium chloride 0.9 % bolus 1,000 mL  Once in ED     Route: Intravenous  Ordered Dose: 1,000 mL     Last MAR action: Stopped MCAULIFFE, MICHAEL J          ED documentation including nurses notes were reviewed by myself.  The case was discussed with the ED Attending.    ASSESSMENT & PLAN     [name] is a 59 y.o. female admitted under OBSERVATION with cute onset of chest pain in a patient with a known history of chronic alcoholism.    Assessment & Plan:  1.  Transfer to the observation unit  2.  Telemetry monitoring  3.  Serial troponins  4.  Nuclear stress test in the morning  5.  2D echocardiogram rule out alcoholic cardiomyopathy  6.  CIWA     I certify the need for admission to the Observation Unit based on the patient's history and the information above.    Dedra Skeens, MD   05/19/2019 12:50 PM

## 2019-05-19 NOTE — ED Notes (Signed)
Bed: C18-A  Expected date:   Expected time:   Means of arrival:   Comments:  EMS

## 2019-05-19 NOTE — ED Provider Notes (Signed)
History     Chief Complaint   Patient presents with    Chest Pain     HPI   Patient complains of left-sided chest pain that began around 3 AM.  Pain is described as heaviness and pressure in her chest.  She had shortness of breath and diaphoresis with the pain.  No history of coronary disease.  She is an alcoholic and drinks one fifth of alcohol a day.  She has not had a drink since the day before yesterday.  She is shaking now.  She does smoke.  She is not diabetic.  No fever or cough.  She had some dry heaves yesterday.  No diarrhea.  No dysuria.  No abdominal pain.  No leg pain or leg edema.  2 days ago she fell and struck her left forehead.  She did not lose consciousness.  No headache.  No neck pain.  No other injury from the fall.      Past Medical History:   Diagnosis Date    Asthma without status asthmaticus     Disorder of liver     multifocal cystic irregularities    DVT (deep venous thrombosis)     Gastroesophageal reflux disease     H/O ETOH abuse     sober 2 years until 03/2014    Hypertension     Pancreatitis        Past Surgical History:   Procedure Laterality Date    CHOLECYSTECTOMY      COLONOSCOPY  12/26/2012    Procedure: COLONOSCOPY;  Surgeon: Gwenith Spitz, MD;  Location: [facility];  Service: Gastroenterology;  Laterality: N/A;    COLONOSCOPY, POLYPECTOMY  12/26/2012    Procedure: COLONOSCOPY, POLYPECTOMY;  Surgeon: Gwenith Spitz, MD;  Location: [facility];  Service: Gastroenterology;  Laterality: N/A;    DEBRIDEMENT & IRRIGATION, WOUND CLOSURE Right 08/29/2018    Procedure: WASHOUT AND CLOSURE OF TRAUMATIC WOUND, RIGHT FOREARM;  Surgeon: Arnoldo Hooker, MD;  Location: [facility];  Service: General;  Laterality: Right;  Right forearm I&D poss. wound closure    ORIF, HUMERUS, DISTAL Left 04/08/2019    Procedure: ORIF, HUMERUS, DISTAL;  Surgeon: Randolm Idol, MD;  Location: [facility];  Service: Orthopedics;  Laterality: Left;  ORIF LEFT DISTAL  HEMERUS    PANCREATECTOMY  01/2011    partial    THORACENTESIS Right 2012    pl effusion       Family History   Adopted: Yes       Social  Social History     Tobacco Use    Smoking status: Current Every Day Smoker     Packs/day: 0.25     Years: 30.00     Pack years: 7.50     Types: Cigarettes    Smokeless tobacco: Never Used    Tobacco comment: 1 pack every 3 days   Substance Use Topics    Alcohol use: Yes     Comment: 4-5 mixed drinks per day    Drug use: No       .     Allergies   Allergen Reactions    Norvasc [Amlodipine] Other (See Comments)     Unsteady on feet    Morphine Hallucinations    Oxycodone Headaches     severe       Home Medications     Med List Status: In Progress Set By: Volney Presser, RN at 05/19/2019  8:58 AM  albuterol (PROVENTIL HFA;VENTOLIN HFA) 108 (90 Base) MCG/ACT inhaler     Inhale 2 puffs into the lungs every 6 (six) hours as needed for Wheezing     Cholecalciferol (Vitamin D3) 1.25 MG (50000 UT) Cap     Take 1 capsule by mouth once a week     esomeprazole (NexIUM) 40 MG capsule     Take 1 capsule (40 mg total) by mouth daily     fluticasone (Flonase) 50 MCG/ACT nasal spray     1 spray by Nasal route daily     folic acid (FOLVITE) 1 MG tablet     Take 1 tablet (1 mg total) by mouth daily     furosemide (LASIX) 40 MG tablet     Take 1 tablet (40 mg total) by mouth daily as needed (swelling in feet or weight gain)     KLOR-CON M 20 MEQ tablet     TAKE 1 TABLET BY MOUTH THREE TIMES DAILY ONLY ON DAYS YOU TAKE LASIX     lisinopril (ZESTRIL) 5 MG tablet     Take 1 tablet (5 mg total) by mouth daily     magnesium oxide (MAG-OX) 400 MG tablet     Take 2 tablets (800 mg total) by mouth 3 (three) times daily with meals     Multiple Vitamins-Minerals (MULTIVITAMIN WITH MINERALS) tablet     Take 1 tablet by mouth every morning.        naloxone (NARCAN) 4 MG/0.1ML nasal spray     1 spray intranasally. If pt does not respond or relapses into respiratory depression call  911. Give additional doses every 2-3 min.     ondansetron (ZOFRAN-ODT) 4 MG disintegrating tablet     Take 1 tablet (4 mg total) by mouth every 12 (twelve) hours as needed for Nausea     Pancrelipase, Lip-Prot-Amyl, (CREON PO)     Take 24,000 Unit by mouth 3 (three) times daily with meals     sodium bicarbonate 650 MG tablet     Take 1 tablet (650 mg total) by mouth 3 (three) times daily     spironolactone (ALDACTONE) 50 MG tablet     Take 1 tablet (50 mg total) by mouth daily Pt take one q day     thiamine (B-1) 100 MG tablet     Take 1 tablet (100 mg total) by mouth daily           Review of Systems   Constitutional: Positive for diaphoresis. Negative for fever.   HENT: Negative for sore throat.    Eyes: Negative for visual disturbance.   Respiratory: Positive for shortness of breath. Negative for cough.    Cardiovascular: Positive for chest pain.   Gastrointestinal: Positive for vomiting. Negative for abdominal pain and diarrhea.   Genitourinary: Negative for dysuria.   Musculoskeletal: Negative for back pain.   Skin: Negative for rash.   Neurological: Negative for headaches.   Psychiatric/Behavioral: Negative for confusion.       Physical Exam    BP: (!) 176/123, Heart Rate: 92, Temp: 97.9 F (36.6 C), Resp Rate: 16, SpO2: 96 %, Weight: 68.9 kg    Physical Exam  Vitals signs and nursing note reviewed.   Constitutional:       Appearance: Normal appearance.   HENT:      Head: Normocephalic and atraumatic.      Nose: Nose normal.      Mouth/Throat:      Mouth: Mucous membranes are moist.  Eyes:      Conjunctiva/sclera: Conjunctivae normal.      Pupils: Pupils are equal, round, and reactive to light.   Neck:      Musculoskeletal: Normal range of motion and neck supple.   Cardiovascular:      Rate and Rhythm: Regular rhythm. Tachycardia present.      Heart sounds: Normal heart sounds.   Pulmonary:      Effort: Pulmonary effort is normal.      Breath sounds: Normal breath sounds.   Abdominal:      Palpations:  Abdomen is soft.      Tenderness: There is no abdominal tenderness. There is no guarding.   Musculoskeletal:         General: No swelling or tenderness.   Skin:     General: Skin is warm and dry.   Neurological:      General: No focal deficit present.      Mental Status: She is alert and oriented to person, place, and time.   Psychiatric:         Mood and Affect: Mood normal.         Behavior: Behavior normal.           MDM and ED Course     ED Medication Orders (From admission, onward)    Start Ordered     Status Ordering Provider    05/19/19 1044 05/19/19 1045  iohexol (OMNIPAQUE) 350 MG/ML injection 65 mL  IMG once as needed     Route: Intravenous  Ordered Dose: 65 mL     Last MAR action: Imaging Agent Given Kieren Ricci J    05/19/19 0918 05/19/19 0917  LORazepam (ATIVAN) injection 1 mg  Once     Route: Intravenous  Ordered Dose: 1 mg     Last MAR action: Given Vijay Durflinger J    05/19/19 0916 05/19/19 0915  PHENobarbital injection 260 mg  Once in ED     Route: Intravenous  Ordered Dose: 260 mg     Last MAR action: Given Mindi Akerson J    05/19/19 0916 05/19/19 0915  sodium chloride 0.9 % bolus 1,000 mL  Once in ED     Route: Intravenous  Ordered Dose: 1,000 mL     Last MAR action: New Bag Rieley Khalsa J             MDM  Chest pain, angina, rule out MI, rule out PE, head injury, alcohol withdrawal               Procedures    Clinical Impression & Disposition     Clinical Impression  Final diagnoses:   Chest pain, unspecified type        ED Disposition     ED Disposition Condition Date/Time Comment    Admit  Mon May 19, 2019 11:52 AM            New Prescriptions    No medications on file                 Donalynn Furlong, MD  05/19/19 1152

## 2019-05-20 ENCOUNTER — Observation Stay: Payer: Medicare Other

## 2019-05-20 LAB — MAGNESIUM: Magnesium: 1.3 mg/dL — ABNORMAL LOW (ref 1.6–2.6)

## 2019-05-20 MED ORDER — TECHNETIUM TC 99M SESTAMIBI - CARDIOLITE
10.00 | Freq: Once | Status: AC | PRN
Start: 2019-05-20 — End: 2019-05-20
  Administered 2019-05-20: 08:00:00 10 via INTRAVENOUS

## 2019-05-20 MED ORDER — MAGNESIUM OXIDE 400 MG TABS (WRAP)
800.0000 mg | ORAL_TABLET | Freq: Three times a day (TID) | ORAL | Status: DC
Start: 2019-05-20 — End: 2019-05-20
  Filled 2019-05-20 (×3): qty 2

## 2019-05-20 MED ORDER — MAGNESIUM OXIDE 400 MG TABS (WRAP)
800.0000 mg | ORAL_TABLET | Freq: Three times a day (TID) | ORAL | Status: DC
Start: 2019-05-20 — End: 2019-05-20
  Administered 2019-05-20: 12:00:00 800 mg via ORAL
  Filled 2019-05-20 (×2): qty 2

## 2019-05-20 MED ORDER — VH MAGNESIUM SULFATE 2 G IN 50 ML IV PREMIX
2.00 g | Freq: Once | INTRAVENOUS | Status: DC
Start: 2019-05-20 — End: 2019-05-20
  Filled 2019-05-20: qty 50

## 2019-05-20 MED ORDER — LISINOPRIL 5 MG PO TABS
5.0000 mg | ORAL_TABLET | Freq: Every morning | ORAL | Status: DC
Start: 2019-05-20 — End: 2019-05-20
  Administered 2019-05-20: 12:00:00 5 mg via ORAL
  Filled 2019-05-20: qty 1

## 2019-05-20 MED ORDER — TECHNETIUM TC 99M SESTAMIBI - CARDIOLITE
33.00 | Freq: Once | Status: AC | PRN
Start: 2019-05-20 — End: 2019-05-20
  Administered 2019-05-20: 09:00:00 33 via INTRAVENOUS

## 2019-05-20 MED ORDER — REGADENOSON 0.4 MG/5ML IV SOLN
INTRAVENOUS | Status: AC
Start: 2019-05-20 — End: ?
  Filled 2019-05-20: qty 5

## 2019-05-20 MED ORDER — REGADENOSON 0.4 MG/5ML IV SOLN
0.40 mg | Freq: Once | INTRAVENOUS | Status: AC
Start: 2019-05-20 — End: 2019-05-20
  Administered 2019-05-20: 10:00:00 0.4 mg via INTRAVENOUS

## 2019-05-20 NOTE — Discharge Summary (Signed)
[facility] OBSERVATION UNIT      Patient: [name]  Admission Date: 05/19/2019   DOB: 1960/10/24  Discharge Date: 05/20/2019    MRN: [id number]  Discharge Attending: Dedra Skeens,*   Referring Physician: Penny Pia, MD  PCP: Penny Pia, MD       DISCHARGE SUMMARY     Discharge Information   Admission Diagnosis:   Chest pain  Alcohol withdrawal    Discharge Diagnosis:   Patient Active Problem List    Diagnosis Date Noted    Chest pain 05/19/2019    Chest pain, unspecified type 01/13/2019    SOB (shortness of breath) 01/13/2019    Hypertension, essential 01/13/2019    Uncomplicated asthma, unspecified asthma severity, unspecified whether persistent 01/13/2019    Continuous chronic alcoholism 11/27/2018    Nausea without vomiting 11/14/2018    History of ERCP 10/23/2018    Thrombocytopenia, unspecified 10/15/2018    Hyponatremia 10/15/2018    Hypomagnesemia 10/15/2018    Pancreatitis, alcoholic, acute 10/14/2018    Hypoglycemia, unspecified 10/14/2018    Metabolic acidosis with increased anion gap and accumulation of organic acids 10/14/2018    Hypertension     Gastroesophageal reflux disease     Asthma without status asthmaticus     Dog bite 08/29/2018    Alcoholic ketoacidosis 08/05/2018    Chronic alcoholic pancreatitis 11/24/2015    Acute on chronic pancreatitis 11/24/2015    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     fluticasone 50 MCG/ACT nasal spray  Commonly known as:  FLONASE  Ask about: Which instructions should I use?     folic acid 1 MG tablet  Commonly known as: FOLVITE  Ask about: Which instructions should I use?     furosemide 40 MG tablet  Commonly known as: LASIX  Take 1 tablet (40 mg total) by mouth daily as needed (swelling in feet or weight gain)     lisinopril 5 MG tablet  Commonly known as: ZESTRIL  Ask about: Which instructions should I use?     magnesium oxide 400 MG tablet  Commonly known as: MAG-OX  Take 2 tablets (800 mg total) by mouth 3 (three) times daily with meals     multivitamin with minerals tablet     ondansetron 4 MG disintegrating tablet  Commonly known as: ZOFRAN-ODT  Take 1 tablet (4 mg total) by mouth every 12 (twelve) hours as needed for Nausea     pantoprazole 40 MG tablet  Commonly known as: PROTONIX     sodium bicarbonate 650 MG tablet  Take 1 tablet (650 mg total) by mouth 3 (three) times daily     spironolactone 50 MG tablet  Commonly known as: ALDACTONE  Ask about: Which instructions should I use?     thiamine 100 MG tablet  Commonly known as: B-1  Ask about: Which instructions should I use?     Vitamin D3 1.25 MG (  50000 UT) Caps  Take 1 capsule by mouth once a week                Hospital Course   Presentation History   59 year old female presented emergency department yesterday for cute onset of substernal chest pain and pressure which awoke her from sleep.  She is a longstanding history of alcoholism.  Her cardiac work-up in the ED was unremarkable and she was sent to the observation unit for further cardiac evaluation.  Serial troponins were negative.  No EKG changes.  Her nuclear stress test was low risk.  2D echocardiogram was essentially unremarkable.  LVEF 60 to 65% PA pressure moderately elevated at 50.  Patient will be discharged home with subsequent follow-up with her PCP.  She is to return to the emergency department should her condition her pain worsen.    Hospital Course (0 Days)   See above    Procedures/Imaging:   NM  Myocardial Perfusion Spect   Final Result      Echocardiogram Adult Complete W Clr/ Dopp Waveform   Final Result      CT Angiogram Chest (PE)   Final Result   1.  NO PE OR OTHER ACUTE CARDIOPULMONARY FINDINGS.   2.  EMPHYSEMA.      ReadingStation:[email or web address]      CT Head WO- (Rad read)   Final Result   NO ACUTE INTRACRANIAL FINDINGS.       ReadingStation:[email or web address]      Cardiac Stress Test    (Results Pending)       Treatment Team:   Attending Provider: Dedra Skeens, MD       Progress Note/Physical Exam at Discharge   Vitals:    05/19/19 2344 05/20/19 0345 05/20/19 0953 05/20/19 1200   BP: 114/77 120/84 120/77 122/85   Pulse: 79 77 86 70   Resp: 16 16 16 16    Temp: 98.2 F (36.8 C)   98.1 F (36.7 C)   TempSrc: Temporal   Temporal   SpO2: 96%   97%   Weight:       Height:           Physical Examination: General appearance - alert, well appearing, and in no distress  Mental status - alert, oriented to person, place, and time  Eyes - pupils equal and reactive, extraocular eye movements intact  Chest - clear to auscultation, no wheezes, rales or rhonchi, symmetric air entry  Heart - normal rate, regular rhythm, normal S1, S2, no murmurs, rubs, clicks or gallops  Abdomen - soft, nontender, nondistended, no masses or organomegaly  Neurological - alert, oriented, normal speech, no focal findings or movement disorder noted  Musculoskeletal - no joint tenderness, deformity or swelling  Extremities - peripheral pulses normal, no pedal edema, no clubbing or cyanosis       Diagnostics     Stress Test Results: No clinical or electrocardiographic evidence of exercise-induced myocardial ischemia at the heart rate achieved.    Labs/Studies Pending at Discharge: No    Last Labs   Recent Labs   Lab 05/19/19  0858   WBC 5.7   RBC 3.68*   Hemoglobin 11.4*   Hematocrit 35.1*   MCV 95   PLT CT 208       Recent Labs   Lab 05/20/19  0456 05/19/19  0858   Sodium  --  135*   Potassium  --  3.9   Chloride  --  99   CO2  --  21   BUN   --  7   Creatinine  --  0.94   Glucose  --  155*   Calcium  --  10.0   Magnesium 1.3*  --         Patient Instructions   Discharge Diet: regular diet  Discharge Activity:  activity as tolerated    Follow Up Appointment:       Time spent examining patient, discussing with patient/family regarding hospital course, chart review, reconciling medications and discharge planning: 40 minutes.      Dedra Skeens, MD    1:01 PM 05/20/2019

## 2019-05-20 NOTE — Progress Notes (Signed)
NM cardiac stress test completed. Lexiscan 0.4mg IV given over 20 seconds. + nausea after Lexiscan injection. Po caffeine given. Improved in recovery. Tolerated well.    Kimisha Eunice, RN, RRT  Nuclear Card Stress Lab RN  [phone]

## 2019-05-20 NOTE — Plan of Care (Signed)
Problem: Moderate/High Fall Risk Score >5  Goal: Patient will remain free of falls  Outcome: Adequate for Discharge

## 2019-05-20 NOTE — Consults (Signed)
05/20/19 ED SW LW: SW consult received and completed. Pt reported a desire to stop drinking. Pt reported she received resources already from [facility] but was agreeable to accepting additional SA resources from SW. SW confirmed pt had any necessary help and DME at home, pt confirmed her roommate Lyda Jester had multiple walkers and that she had everything she needed at home. Pt also plans to f/u w/ PCP. Pt shared that she was unable to get a hold of her roommate Lyda Jester or her friend Casimer Bilis to pick her up. Pt was about to download the [employer] app and had a credit card in hand. SW advised that if pt was unable to get an Benedetto Goad to request the RN to get a taxi for pt. Pt agreeable to d/c plan. Charge RN updated.    Sherolyn Buba, MSW  Social Worker   [phone]

## 2019-05-20 NOTE — Progress Notes (Addendum)
NURSE NOTE SUMMARY  [facility] - CLINICAL OBSERVATION UNIT   Patient Name: [name]   Attending Physician: Dedra Skeens,*   Today's date:   05/20/2019 LOS: 0 days   Shift Summary:                                                              1191- Patient resting quietly in bed. Patient got up to the bathroom and then sent for stress test.  1200- Patient returned from stress test. VSS. Patient c/o nausea and left elbow pain rating 8/10. Will medicate with toradol IV and zofran per prn orders. Will continue to monitor.  1335- Discharge instructions discussed with patient. Patient verbalized understanding. IV removed. Patient waiting on ride. Charge nurse is setting up transport.  1400- Patient escorted to main entrance.   Provider Notifications:        Rapid Response Notifications:  Mobility:      PMP Activity: Step 7 - Walks out of Room (05/19/2019  8:00 PM)     Weight tracking:  Family Dynamic:   Last 3 Weights for the past 72 hrs (Last 3 readings):   Weight   05/19/19 0850 68.9 kg (151 lb 14.4 oz)             Last Bowel Movement   No data recorded

## 2019-05-20 NOTE — UM Notes (Signed)
[facility] Utilization Management Review Sheet    Facility :  [facility]    NAME: [name]  MR#: [id number]    CSN#: [id number]    ROOM: 2513/2513-A AGE: 59 y.o.    ADMIT DATE AND TIME: 05/19/2019  8:47 AM      PATIENT CLASS:    ATTENDING PHYSICIAN: Dedra Skeens,*  PAYOR:Payor: MEDICARE / Plan: MEDICARE PART A AND B / Product Type: Medicare /       AUTH #:     DIAGNOSIS:     ICD-10-CM    1. Chest pain, unspecified type  R07.9        HISTORY:   Past Medical History:   Diagnosis Date    Asthma without status asthmaticus     Disorder of liver     multifocal cystic irregularities    DVT (deep venous thrombosis)     Gastroesophageal reflux disease     H/O ETOH abuse     sober 2 years until 03/2014    Hypertension     Pancreatitis        DATE OF REVIEW: 05/20/2019    VITALS: BP 122/85    Pulse 70    Temp 98.1 F (36.7 C) (Temporal)    Resp 16    Ht 1.651 m (5\' 5" )    Wt 68.9 kg (151 lb 14.4 oz)    SpO2 97%    BMI 25.28 kg/m     Active Hospital Problems    Diagnosis    Chest pain       admitted under OBSERVATION with cute onset of chest pain in a patient with a known history of chronic alcoholism.    Assessment & Plan:  1.  Transfer to the observation unit  2.  Telemetry monitoring  3.  Serial troponins  4.  Nuclear stress test in the morning  5.  2D echocardiogram rule out alcoholic cardiomyopathy  6.  CIWA

## 2019-05-20 NOTE — Progress Notes (Signed)
NURSE NOTE SUMMARY  [facility] - CLINICAL OBSERVATION UNIT   Patient Name: [name]   Attending Physician: Dedra Skeens,*   Today's date:   05/20/2019 LOS: 0 days   Shift Summary:                                                              Received report from day shift. Assumed care of pt. Assessment completed. Denies CP/SOB. Will continue to monitor     Provider Notifications:        Rapid Response Notifications:  Mobility:      PMP Activity: Step 7 - Walks out of Room (05/19/2019  2:37 PM)     Weight tracking:  Family Dynamic:   Last 3 Weights for the past 72 hrs (Last 3 readings):   Weight   05/19/19 0850 68.9 kg (151 lb 14.4 oz)             Last Bowel Movement   No data recorded

## 2019-05-20 NOTE — Progress Notes (Signed)
NM cardiac stress test; 0.4mg IV lexiscan given over 20 seconds; no chest pain or ST changes; sestamibi images pending    Mileah Hemmer BS EP-C

## 2019-05-22 ENCOUNTER — Encounter (HOSPITAL_BASED_OUTPATIENT_CLINIC_OR_DEPARTMENT_OTHER): Payer: Self-pay

## 2019-05-22 ENCOUNTER — Encounter: Payer: Self-pay | Admitting: Addiction (Substance Use Disorder)

## 2019-05-22 NOTE — Progress Notes (Signed)
CATS IP DETOX  PRESCREEN    Patients Name (ALWAYS update Registration for Patient): [name]          Presenting Problem: Why are you seeking help? Pt reports that she is seeking help for alcohol dependency."       Substance/Drug Use    Do you have a history or are you presently using drugs (including recreational drugs) or alcohol? yes     If YES, list and complete below.    Drug of Choice # 1: Alcohol  Route/Way of use:Pt reports that she drinks vodka.  Amount/Quantity of use:Pt reports that she drinks a half gallon within 3 days.  Frequency/How often you use the drug: Daily  How long have you been using at this rate? Pt reports that she has been consuming alcohol at this rate for 2 months.  Date of Last Use? Pt reports that she last consumed alcohol yesterday.    Drug of Choice # 2:   Route/Way of use:  Amount/Quantity of use:  Frequency/How often you use the drug:  How long have you been using at this rate?   Date of Last Use?     Drug of Choice # 3:   Route/Way of use:  Amount/Quantity of use:  Frequency/How often you use the drug:  How long have you been using at this rate?   Date of Last Use?       Additional Comment:       Recommended Level of Care (LOC): CATS IP DETOX      Reminder:  - Note that all scheduled CATS IP Detox admission needs medical clearance and COVID testing at the [facility] emergency room  - Please bring a photo ID, Insurance card and a list/or bottles of all prescription medication to include dosages to your scheduled admission.  - We ask that you give [name] at least 24 hour notice if you need to cancel or reschedule your appointment.

## 2019-05-22 NOTE — Progress Notes (Signed)
UPDATED CATS PRE SCREEN      Patients Name (ALWAYS update Registration for Patient): [name]      Date of Most Recent Prescreen: wants to detox was not prescreened by [facility]  (Most recent Prescreen needs to have been completed within a WEEK of updated prescreen)    Safety Information:    Are you currently experiencing Suicidal ideation with a plan or intent:  no  If Yes, explain:none  Note: Note: REFER OUT IF APPROPRIATE    Are you currently experiencing Homicidal ideation with a plan or intent:  no  If Yes, explain:none  Note: Note: REFER OUT IF APPROPRIATE       Substance Abuse/Drug Use (Update from the most recent Prescreen/completed within a week)    Drug of Choice # 1: etoh   Route/Way of use: half gallon vodka  every three days   Amount/Quantity of use:  Frequency/How often you use the drug daily  How long have you been using at this rate? Five years  Date of Last Use? 05/22/2019    Additional Comment: no issues walking, no pregnancy, no concerns for staying inhouse with total abstinence, Pt wants to try a residential facility if she can get into a program.  She has been looking for a program but everyone reports no beds available to do treatment.  Pt has not seen a psychiatrist and reports taking folic acid, thiamine, and having pancreatitis in years past.     Recommended Level of Care (LOC):  INPT Detox    05/23/2019    10:30     Reminder:   Updated Prescreen can be utilized within a week of the last updated prescreen.    A full prescreen has to be completed if contact is made after a week of the last updated prescreen.

## 2019-05-23 ENCOUNTER — Inpatient Hospital Stay
Admission: RE | Admit: 2019-05-23 | Discharge: 2019-05-28 | DRG: 895 | Disposition: A | Discharge status: Home / Self Care | Payer: Medicare Other | Source: Ambulatory Visit | Attending: Addiction Medicine | Admitting: Addiction Medicine

## 2019-05-23 ENCOUNTER — Emergency Department (EMERGENCY_DEPARTMENT_HOSPITAL)
Admission: EM | Admit: 2019-05-23 | Discharge: 2019-05-23 | Disposition: A | Discharge status: Other Acute Inpatient Hospital | Payer: Medicare Other | Source: Home / Self Care | Attending: Emergency Medicine | Admitting: Emergency Medicine

## 2019-05-23 ENCOUNTER — Encounter: Payer: Self-pay | Admitting: Psychiatry

## 2019-05-23 DIAGNOSIS — Z20822 Contact with and (suspected) exposure to covid-19: Secondary | ICD-10-CM | POA: Insufficient documentation

## 2019-05-23 DIAGNOSIS — J45909 Unspecified asthma, uncomplicated: Secondary | ICD-10-CM | POA: Diagnosis present

## 2019-05-23 DIAGNOSIS — F1029 Alcohol dependence with unspecified alcohol-induced disorder: Secondary | ICD-10-CM

## 2019-05-23 DIAGNOSIS — E559 Vitamin D deficiency, unspecified: Secondary | ICD-10-CM | POA: Diagnosis present

## 2019-05-23 DIAGNOSIS — Z79899 Other long term (current) drug therapy: Secondary | ICD-10-CM

## 2019-05-23 DIAGNOSIS — E785 Hyperlipidemia, unspecified: Secondary | ICD-10-CM | POA: Diagnosis present

## 2019-05-23 DIAGNOSIS — F17210 Nicotine dependence, cigarettes, uncomplicated: Secondary | ICD-10-CM | POA: Diagnosis present

## 2019-05-23 DIAGNOSIS — Z885 Allergy status to narcotic agent status: Secondary | ICD-10-CM

## 2019-05-23 DIAGNOSIS — F329 Major depressive disorder, single episode, unspecified: Secondary | ICD-10-CM | POA: Diagnosis present

## 2019-05-23 DIAGNOSIS — F419 Anxiety disorder, unspecified: Secondary | ICD-10-CM | POA: Diagnosis present

## 2019-05-23 DIAGNOSIS — K219 Gastro-esophageal reflux disease without esophagitis: Secondary | ICD-10-CM | POA: Diagnosis present

## 2019-05-23 DIAGNOSIS — G8929 Other chronic pain: Secondary | ICD-10-CM | POA: Diagnosis present

## 2019-05-23 DIAGNOSIS — F10239 Alcohol dependence with withdrawal, unspecified: Principal | ICD-10-CM | POA: Diagnosis present

## 2019-05-23 DIAGNOSIS — K861 Other chronic pancreatitis: Secondary | ICD-10-CM | POA: Diagnosis present

## 2019-05-23 DIAGNOSIS — I10 Essential (primary) hypertension: Secondary | ICD-10-CM | POA: Insufficient documentation

## 2019-05-23 DIAGNOSIS — K7689 Other specified diseases of liver: Secondary | ICD-10-CM | POA: Diagnosis present

## 2019-05-23 DIAGNOSIS — Z86718 Personal history of other venous thrombosis and embolism: Secondary | ICD-10-CM

## 2019-05-23 DIAGNOSIS — M79602 Pain in left arm: Secondary | ICD-10-CM | POA: Diagnosis present

## 2019-05-23 DIAGNOSIS — R6889 Other general symptoms and signs: Secondary | ICD-10-CM | POA: Diagnosis present

## 2019-05-23 LAB — GFR: EGFR: >60

## 2019-05-23 LAB — COVID-19 (SARS-COV-2): SARS CoV 2 Overall Result: NEGATIVE

## 2019-05-23 LAB — CBC AND DIFFERENTIAL
Absolute NRBC: 0 10*3/uL (ref 0.00–0.00)
Basophils Absolute Automated: 0.01 10*3/uL (ref 0.00–0.08)
Basophils Automated: 0.2 %
Eosinophils Absolute Automated: 0.03 10*3/uL (ref 0.00–0.44)
Eosinophils Automated: 0.7 %
Hematocrit: 32.1 % — ABNORMAL LOW (ref 34.7–43.7)
Hgb: 10.2 g/dL — ABNORMAL LOW (ref 11.4–14.8)
Immature Granulocytes Absolute: 0.01 10*3/uL (ref 0.00–0.07)
Immature Granulocytes: 0.2 %
Lymphocytes Absolute Automated: 1.2 10*3/uL (ref 0.42–3.22)
Lymphocytes Automated: 27.1 %
MCH: 29.6 pg (ref 25.1–33.5)
MCHC: 31.8 g/dL (ref 31.5–35.8)
MCV: 93 fL (ref 78.0–96.0)
MPV: 9.9 fL (ref 8.9–12.5)
Monocytes Absolute Automated: 0.49 10*3/uL (ref 0.21–0.85)
Monocytes: 11.1 %
Neutrophils Absolute: 2.68 10*3/uL (ref 1.10–6.33)
Neutrophils: 60.7 %
Nucleated RBC: 0 /100 WBC (ref 0.0–0.0)
Platelets: 148 10*3/uL (ref 142–346)
RBC: 3.45 10*6/uL — ABNORMAL LOW (ref 3.90–5.10)
RDW: 17 % — ABNORMAL HIGH (ref 11–15)
WBC: 4.42 10*3/uL (ref 3.10–9.50)

## 2019-05-23 LAB — ACETAMINOPHEN LEVEL: Acetaminophen Level: <7 ug/mL — ABNORMAL LOW (ref 10–30)

## 2019-05-23 LAB — RAPID DRUG SCREEN, URINE
Barbiturate Screen, UR: POSITIVE — AB
Benzodiazepine Screen, UR: NEGATIVE
Cannabinoid Screen, UR: NEGATIVE
Cocaine, UR: NEGATIVE
Opiate Screen, UR: NEGATIVE
PCP Screen, UR: NEGATIVE
Urine Amphetamine Screen: NEGATIVE

## 2019-05-23 LAB — COMPREHENSIVE METABOLIC PANEL
ALT: 19 U/L (ref 0–55)
AST (SGOT): 30 U/L (ref 5–34)
Albumin/Globulin Ratio: 1.2 (ref 0.9–2.2)
Albumin: 4.2 g/dL (ref 3.5–5.0)
Alkaline Phosphatase: 156 U/L — ABNORMAL HIGH (ref 37–106)
Anion Gap: 12 (ref 5.0–15.0)
BUN: 5 mg/dL — ABNORMAL LOW (ref 7.0–19.0)
Bilirubin, Total: 0.8 mg/dL (ref 0.2–1.2)
CO2: 20 mEq/L — ABNORMAL LOW (ref 22–29)
Calcium: 9.4 mg/dL (ref 8.5–10.5)
Chloride: 99 mEq/L — ABNORMAL LOW (ref 100–111)
Creatinine: 0.9 mg/dL (ref 0.6–1.0)
Globulin: 3.6 g/dL (ref 2.0–3.6)
Glucose: 114 mg/dL — ABNORMAL HIGH (ref 70–100)
Potassium: 3.5 mEq/L (ref 3.5–5.1)
Protein, Total: 7.8 g/dL (ref 6.0–8.3)
Sodium: 131 mEq/L — ABNORMAL LOW (ref 136–145)

## 2019-05-23 LAB — URINALYSIS REFLEX TO MICROSCOPIC EXAM - REFLEX TO CULTURE
Bilirubin, UA: NEGATIVE
Blood, UA: NEGATIVE
Glucose, UA: NEGATIVE
Ketones UA: NEGATIVE
Leukocyte Esterase, UA: NEGATIVE
Nitrite, UA: NEGATIVE
Protein, UR: NEGATIVE
Specific Gravity UA: 1.005 (ref 1.001–1.035)
Urine pH: 7 (ref 5.0–8.0)
Urobilinogen, UA: NORMAL mg/dL (ref 0.2–2.0)

## 2019-05-23 LAB — ETHANOL: Alcohol: NOT DETECTED mg/dL

## 2019-05-23 LAB — SALICYLATE LEVEL: Salicylate Level: <5 mg/dL — ABNORMAL LOW (ref 15.0–30.0)

## 2019-05-23 LAB — TSH: TSH: 1.09 u[IU]/mL (ref 0.35–4.94)

## 2019-05-23 LAB — LIPASE: Lipase: 11 U/L (ref 8–78)

## 2019-05-23 LAB — MAGNESIUM: Magnesium: 1.2 mg/dL — ABNORMAL LOW (ref 1.6–2.6)

## 2019-05-23 MED ORDER — OXAZEPAM 15 MG PO CAPS
15.00 mg | ORAL_CAPSULE | ORAL | Status: DC | PRN
Start: 2019-05-25 — End: 2019-05-28
  Administered 2019-05-24 – 2019-05-28 (×6): 15 mg via ORAL
  Filled 2019-05-23 (×5): qty 1

## 2019-05-23 MED ORDER — TUBERCULIN PPD 5 UNIT/0.1ML ID SOLN
0.10 mL | Freq: Once | INTRADERMAL | Status: DC
Start: 2019-05-24 — End: 2019-05-28

## 2019-05-23 MED ORDER — LISINOPRIL 10 MG PO TABS
5.0000 mg | ORAL_TABLET | Freq: Every day | ORAL | Status: DC
Start: 2019-05-24 — End: 2019-05-28
  Administered 2019-05-24 – 2019-05-28 (×5): 5 mg via ORAL
  Filled 2019-05-23 (×5): qty 1

## 2019-05-23 MED ORDER — THIAMINE (VITAMIN B1) 100 MG PO TABS (WRAP)
100.0000 mg | ORAL_TABLET | Freq: Every day | ORAL | Status: DC
Start: 2019-05-24 — End: 2019-05-28
  Administered 2019-05-24 – 2019-05-28 (×5): 100 mg via ORAL
  Filled 2019-05-23 (×5): qty 1

## 2019-05-23 MED ORDER — DICYCLOMINE HCL 10 MG PO CAPS
20.00 mg | ORAL_CAPSULE | ORAL | Status: DC | PRN
Start: 2019-05-23 — End: 2019-05-28
  Administered 2019-05-24: 20 mg via ORAL
  Filled 2019-05-23: qty 2

## 2019-05-23 MED ORDER — ACETAMINOPHEN 500 MG PO TABS
1000.0000 mg | ORAL_TABLET | Freq: Once | ORAL | Status: AC
Start: 2019-05-23 — End: 2019-05-23
  Administered 2019-05-23: 18:00:00 1000 mg via ORAL
  Filled 2019-05-23: qty 2

## 2019-05-23 MED ORDER — FUROSEMIDE 40 MG PO TABS
40.0000 mg | ORAL_TABLET | Freq: Every day | ORAL | Status: DC
Start: 2019-05-24 — End: 2019-05-28
  Administered 2019-05-24 – 2019-05-28 (×5): 40 mg via ORAL
  Filled 2019-05-23 (×5): qty 1

## 2019-05-23 MED ORDER — FLUTICASONE PROPIONATE 50 MCG/ACT NA SUSP
1.00 | Freq: Two times a day (BID) | NASAL | Status: DC | PRN
Start: 2019-05-23 — End: 2019-05-28
  Filled 2019-05-23: qty 16

## 2019-05-23 MED ORDER — SPIRONOLACTONE 50 MG PO TABS
50.0000 mg | ORAL_TABLET | Freq: Every day | ORAL | Status: DC
Start: 2019-05-24 — End: 2019-05-28
  Administered 2019-05-24 – 2019-05-28 (×4): 50 mg via ORAL
  Filled 2019-05-23 (×6): qty 1

## 2019-05-23 MED ORDER — MAGNESIUM HYDROXIDE 400 MG/5ML PO SUSP
30.00 mL | Freq: Every evening | ORAL | Status: DC | PRN
Start: 2019-05-23 — End: 2019-05-28

## 2019-05-23 MED ORDER — VITAMIN D (CHOLECALCIFEROL) 10 MCG (400 UNIT) PO TABS
10.0000 ug | ORAL_TABLET | Freq: Every day | ORAL | Status: DC
Start: 2019-05-24 — End: 2019-05-28
  Administered 2019-05-24 – 2019-05-28 (×5): 10 ug via ORAL
  Filled 2019-05-23 (×5): qty 1

## 2019-05-23 MED ORDER — TAB-A-VITE/BETA CAROTENE PO TABS
1.0000 | ORAL_TABLET | Freq: Every day | ORAL | Status: DC
Start: 2019-05-24 — End: 2019-05-28
  Administered 2019-05-24 – 2019-05-28 (×5): 1 via ORAL
  Filled 2019-05-23 (×5): qty 1

## 2019-05-23 MED ORDER — NICOTINE 21 MG/24HR TD PT24
1.00 | MEDICATED_PATCH | Freq: Every day | TRANSDERMAL | Status: DC
Start: 2019-05-24 — End: 2019-05-28
  Administered 2019-05-24 – 2019-05-27 (×4): 1 via TRANSDERMAL
  Filled 2019-05-23 (×5): qty 1

## 2019-05-23 MED ORDER — ONDANSETRON 4 MG PO TBDP
4.00 mg | ORAL_TABLET | Freq: Four times a day (QID) | ORAL | Status: DC | PRN
Start: 2019-05-23 — End: 2019-05-28
  Administered 2019-05-24: 4 mg via ORAL
  Filled 2019-05-23: qty 1

## 2019-05-23 MED ORDER — NICOTINE POLACRILEX 2 MG MT GUM
2.00 mg | CHEWING_GUM | OROMUCOSAL | Status: DC | PRN
Start: 2019-05-23 — End: 2019-05-28

## 2019-05-23 MED ORDER — IBUPROFEN 600 MG PO TABS
600.0000 mg | ORAL_TABLET | Freq: Four times a day (QID) | ORAL | Status: DC | PRN
Start: 2019-05-23 — End: 2019-05-28
  Administered 2019-05-24 – 2019-05-27 (×8): 600 mg via ORAL
  Filled 2019-05-23 (×8): qty 1

## 2019-05-23 MED ORDER — CLONIDINE HCL 0.1 MG PO TABS
0.1000 mg | ORAL_TABLET | Freq: Three times a day (TID) | ORAL | Status: DC | PRN
Start: 2019-05-23 — End: 2019-05-28
  Administered 2019-05-24 – 2019-05-26 (×3): 0.1 mg via ORAL
  Filled 2019-05-23 (×3): qty 1

## 2019-05-23 MED ORDER — PANTOPRAZOLE SODIUM 40 MG PO TBEC
40.00 mg | DELAYED_RELEASE_TABLET | Freq: Every morning | ORAL | Status: DC
Start: 2019-05-24 — End: 2019-05-28
  Administered 2019-05-24 – 2019-05-28 (×5): 40 mg via ORAL
  Filled 2019-05-23 (×5): qty 1

## 2019-05-23 MED ORDER — ATENOLOL 50 MG PO TABS
25.0000 mg | ORAL_TABLET | Freq: Four times a day (QID) | ORAL | Status: DC | PRN
Start: 2019-05-23 — End: 2019-05-28
  Administered 2019-05-27 (×2): 25 mg via ORAL
  Filled 2019-05-23 (×2): qty 1

## 2019-05-23 MED ORDER — ONDANSETRON 4 MG PO TBDP
4.00 mg | ORAL_TABLET | Freq: Once | ORAL | Status: AC
Start: 2019-05-23 — End: 2019-05-23
  Administered 2019-05-23: 18:00:00 4 mg via ORAL
  Filled 2019-05-23: qty 1

## 2019-05-23 MED ORDER — ALBUTEROL SULFATE HFA 108 (90 BASE) MCG/ACT IN AERS
2.00 | INHALATION_SPRAY | Freq: Four times a day (QID) | RESPIRATORY_TRACT | Status: DC | PRN
Start: 2019-05-23 — End: 2019-05-28
  Filled 2019-05-23: qty 8

## 2019-05-23 MED ORDER — FOLIC ACID 1 MG PO TABS
1.0000 mg | ORAL_TABLET | Freq: Every day | ORAL | Status: DC
Start: 2019-05-24 — End: 2019-05-28
  Administered 2019-05-24 – 2019-05-28 (×5): 1 mg via ORAL
  Filled 2019-05-23 (×5): qty 1

## 2019-05-23 MED ORDER — ALUM & MAG HYDROXIDE-SIMETH 200-200-20 MG/5ML PO SUSP
30.00 mL | ORAL | Status: DC | PRN
Start: 2019-05-23 — End: 2019-05-28
  Administered 2019-05-25: 23:00:00 30 mL via ORAL
  Filled 2019-05-23: qty 30

## 2019-05-23 MED ORDER — LOPERAMIDE HCL 2 MG PO CAPS
2.00 mg | ORAL_CAPSULE | ORAL | Status: DC | PRN
Start: 2019-05-23 — End: 2019-05-28

## 2019-05-23 MED ORDER — HYDROXYZINE PAMOATE 25 MG PO CAPS
25.00 mg | ORAL_CAPSULE | Freq: Four times a day (QID) | ORAL | Status: DC | PRN
Start: 2019-05-23 — End: 2019-05-28
  Administered 2019-05-24: 25 mg via ORAL
  Filled 2019-05-23: qty 1

## 2019-05-23 MED ORDER — KETOROLAC TROMETHAMINE 30 MG/ML IJ SOLN
30.00 mg | Freq: Once | INTRAMUSCULAR | Status: AC
Start: 2019-05-23 — End: 2019-05-23
  Administered 2019-05-23: 20:00:00 30 mg via INTRAMUSCULAR
  Filled 2019-05-23: qty 1

## 2019-05-23 MED ORDER — TRAZODONE HCL 50 MG PO TABS
75.0000 mg | ORAL_TABLET | Freq: Every evening | ORAL | Status: DC | PRN
Start: 2019-05-23 — End: 2019-05-28
  Administered 2019-05-24 – 2019-05-27 (×5): 75 mg via ORAL
  Filled 2019-05-23: qty 1
  Filled 2019-05-23 (×4): qty 2

## 2019-05-23 MED ORDER — OXAZEPAM 15 MG PO CAPS
30.00 mg | ORAL_CAPSULE | ORAL | Status: AC | PRN
Start: 2019-05-23 — End: 2019-05-25
  Administered 2019-05-24 – 2019-05-25 (×3): 30 mg via ORAL
  Filled 2019-05-23 (×3): qty 1
  Filled 2019-05-23: qty 2

## 2019-05-23 MED ORDER — FLUTICASONE PROPIONATE 50 MCG/ACT NA SUSP
1.00 | Freq: Four times a day (QID) | NASAL | Status: DC | PRN
Start: 2019-05-23 — End: 2019-05-23

## 2019-05-23 MED ORDER — LISINOPRIL 5 MG PO TABS
5.00 mg | ORAL_TABLET | Freq: Once | ORAL | Status: AC
Start: 2019-05-23 — End: 2019-05-23
  Administered 2019-05-23: 18:00:00 5 mg via ORAL
  Filled 2019-05-23: qty 1

## 2019-05-23 NOTE — EDIE (Signed)
COLLECTIVE?NOTIFICATION?05/23/2019 15:09?Maus, Autumn L?MRN: [id number]    Criteria Met      5 ED Visits in 12 Months    Security and Safety  No recent Security Events currently on file    ED Care Guidelines  There are currently no ED Care Guidelines for this patient. Please check your facility's medical records system.        Prescription Monitoring Program  280??- Narcotic Use Score  140??- Sedative Use Score  000??- Stimulant Use Score  420??- Overdose Risk Score  - All Scores range from 000-999 with 75% of the population scoring < 200 and on 1% scoring above 650  - The last digit of the narcotic, sedative, and stimulant score indicates the number of active prescriptions of that type  - Higher Use scores correlate with increased prescribers, pharmacies, mg equiv, and overlapping prescriptions  - Higher Overdose Risk Scores correlate with increased risk of unintentional overdose death   Concerning or unexpectedly high scores should prompt a review of the PMP record; this does not constitute checking PMP for prescribing purposes.      E.D. Visit Count (12 mo.)  Facility Visits   [facility] 6   [facility] 1   Total 7   Note: Visits indicate total known visits.     Recent Emergency Department Visit Summary  Date Facility [facility] Type Diagnoses or Chief Complaint   May 23, 2019 [facility] [place] [facility]      triage      May 19, 2019 [facility] [place] [place] [facility]      Chest Pain      Chest pain, unspecified      Mar 18, 2019 [facility] [place] [place] [facility]      FALL, ELBOW PAIN      Joint Swelling      Unspecified physeal fracture of lower end of humerus, left arm, initial encounter for closed fracture      Nov 27, 2018 [facility]. [place] [place] [facility]      Chest Pain      Syncope      Alcohol-induced chronic pancreatitis      Oct 14, 2018 [facility] [place] [place] [facility]      abd pain/vomiting      Abdominal Pain       Acidosis      Tobacco use      Nausea with vomiting, unspecified      Alcohol abuse, uncomplicated      Alcohol-induced chronic pancreatitis      Alcohol induced acute pancreatitis without necrosis or infection      Aug 29, 2018 [facility]. [place] [place] [facility]      Dog Bite      Open bite of right forearm, initial encounter      Bitten by dog, initial encounter      Aug 05, 2018 [facility] [place] [place] [facility]      Dog Bite      Acidosis      Bitten by dog, initial encounter      Alcohol abuse, uncomplicated          Recent Inpatient Visit Summary  Date Facility [facility] Type Diagnoses or Chief Complaint   Nov 27, 2018 [facility] [place] [place]      Alcohol-induced chronic pancreatitis      Alcohol dependence, uncomplicated  Alcohol induced acute pancreatitis without necrosis or infection      Oct 14, 2018 [facility] [place] [place]      Alcohol-induced chronic pancreatitis      Tobacco use      Nausea with vomiting, unspecified      Alcohol abuse, uncomplicated      Acidosis      Alcohol induced acute pancreatitis without necrosis or infection          Care Team  There is not a care team on record at this time.   Collective Portal  This patient has registered at the [facility]   For more information visit: [email or web address]     PLEASE NOTE:     1.   Any care recommendations and other clinical information are provided as guidelines or for historical purposes only, and providers should exercise their own clinical judgment when providing care.    2.   You may only use this information for purposes of treatment, payment or health care operations activities, and subject to the limitations of applicable Collective Policies.    3.   You should consult directly with the organization that provided a care guideline or other clinical history  with any questions about additional information or accuracy or completeness of information provided.    ? 2021 [place] - [email or web address]

## 2019-05-23 NOTE — ED Notes (Signed)
This patient was seen by me in triage and initial testing was ordered based on presenting complaint. Care was expedited. I am not the primary provider for this patient.        Elesa Hacker, MD  05/23/19 1517

## 2019-05-23 NOTE — ED Notes (Signed)
Pt has been screened by [facility] is is scheduled for possible [facility] admission today, bed is held pending clearance in ED.    Once pt is medically cleared, ED provider may request assessment from [facility] NP Mylene Kedrock, per CATS expedited placement process. If pt presents with psych concerns, please contact psych liaison office, [phone], for assessment prior to contacting [facility].
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
## 2019-05-23 NOTE — ED Provider Notes (Signed)
[facility] EMERGENCY DEPARTMENT H&P         CLINICAL INFORMATION        HPI:      Chief Complaint: Inpatient Detox        [name] is a 59 y.o. female with PMHx of pancreatitis who presents with moderate constant request for medical clearance for inpatient detox. No prior hx of detox. Patient reports drinking 1 bottle of vodka/day. Last drink 3 days ago. No hx of withdrawal seizures but gets withdrawal sxs of nausea and tremor. No drug use. No psychiatric diagnoses or hx of psychiatric admissions. No history of physical violence.    Fell 7 weeks ago shattered left elbow had ORIF surgery  Fell 1 week ago had head CT at outside hospital    History obtained from: patient, family, review of prior chart      ROS:      Positive and negative ROS elements as per HPI.  All other systems reviewed and negative.      Physical Exam:      Pulse 84   BP (!) 210/121   Resp 18   SpO2 98 %   Temp 97.6 F (36.4 C)    Physical Exam   Constitutional: She is oriented to person, place, and time. She appears well-developed and well-nourished.   HENT:   Head: Normocephalic.   Mouth/Throat: Oropharynx is clear and moist.   Mild left periorbital ecchymosis   Eyes: Pupils are equal, round, and reactive to light. Conjunctivae and EOM are normal.   Neck: Normal range of motion. Neck supple.   Cardiovascular: Normal rate, regular rhythm and normal heart sounds.   Pulmonary/Chest: Effort normal and breath sounds normal.   Abdominal: Soft. Bowel sounds are normal. There is no abdominal tenderness. There is no guarding.   Musculoskeletal: Normal range of motion.         General: No edema (no palpable cords).      Comments: Postop deformity left elbow no signs of infection distal neurovascular intact full range of motion without tenderness   Neurological: She is alert and oriented to person, place, and time. She has normal reflexes.   Skin: Skin is warm and dry.   Psychiatric: She has a normal mood and  affect. Thought content normal.                PAST HISTORY        Primary Care Provider: Penny Pia, MD        PMH/PSH:    .     Past Medical History:   Diagnosis Date    Asthma without status asthmaticus     Disorder of liver     multifocal cystic irregularities    DVT (deep venous thrombosis)     Gastroesophageal reflux disease     H/O ETOH abuse     sober 2 years until 03/2014    Hypertension     Pancreatitis        She has a past surgical history that includes Colonoscopy (12/26/2012); COLONOSCOPY, POLYPECTOMY (12/26/2012); Cholecystectomy; Pancreatectomy (01/2011); Thoracentesis (Right, 2012); DEBRIDEMENT & IRRIGATION, WOUND CLOSURE (Right, 08/29/2018); and ORIF, HUMERUS, DISTAL (Left, 04/08/2019).      Social/Family History:      She reports that she has been smoking cigarettes. She has a 7.50 pack-year smoking history. She has never used smokeless tobacco. She reports current alcohol use. She reports that she does not use drugs.    Family History   Adopted:  Yes         Listed Medications on Arrival:    .     Home Medications             albuterol (PROVENTIL HFA;VENTOLIN HFA) 108 (90 Base) MCG/ACT inhaler     Inhale 2 puffs into the lungs every 6 (six) hours as needed for Wheezing     Cholecalciferol (Vitamin D3) 1.25 MG (50000 UT) Cap     Take 1 capsule by mouth once a week     Patient taking differently: Take 1 capsule by mouth Once each week on Monday        fluticasone (FLONASE) 50 MCG/ACT nasal spray     1 spray by Nasal route daily as needed (allergies)     folic acid (FOLVITE) 1 MG tablet     Take 1 mg by mouth every morning     furosemide (LASIX) 40 MG tablet     Take 1 tablet (40 mg total) by mouth daily as needed (swelling in feet or weight gain)     lisinopril (ZESTRIL) 5 MG tablet     Take 5 mg by mouth every morning     magnesium oxide (MAG-OX) 400 MG tablet     Take 2 tablets (800 mg total) by mouth 3 (three) times daily with meals     Multiple Vitamins-Minerals (MULTIVITAMIN WITH  MINERALS) tablet     Take 1 tablet by mouth every morning.        ondansetron (ZOFRAN-ODT) 4 MG disintegrating tablet     Take 1 tablet (4 mg total) by mouth every 12 (twelve) hours as needed for Nausea     pancrelipase, Lip-Prot-Amyl, (Creon) 24000-76000 units Cap DR Particles     Take 72,000 units of lipase by mouth 3 (three) times daily with meals Take 3 capsules (72000 units of lipase) 3 times daily with meals     pantoprazole (PROTONIX) 40 MG tablet     Take 40 mg by mouth every morning     sodium bicarbonate 650 MG tablet     Take 1 tablet (650 mg total) by mouth 3 (three) times daily     spironolactone (ALDACTONE) 50 MG tablet     Take 50 mg by mouth every morning     thiamine (B-1) 100 MG tablet     Take 100 mg by mouth every morning         Allergies: She is allergic to norvasc [amlodipine]; morphine; and oxycodone.            VISIT INFORMATION        Clinical Course in the ED:          Medications Given in the ED:    .     ED Medication Orders (From admission, onward)    Start Ordered     Status Ordering Provider    05/23/19 1951 05/23/19 1950  ketorolac (TORADOL) injection 30 mg  Once     Route: Intramuscular  Ordered Dose: 30 mg     Last MAR action: Given Samuel Bouche    05/23/19 1812 05/23/19 1811  lisinopril (ZESTRIL) tablet 5 mg  Once     Route: Oral  Ordered Dose: 5 mg     Last MAR action: Given Felicity Coyer J    05/23/19 1518 05/23/19 1517  ondansetron (ZOFRAN-ODT) disintegrating tablet 4 mg  Once     Route: Oral  Ordered Dose: 4 mg     Last MAR action:  Given Elesa Hacker    05/23/19 1518 05/23/19 1517  acetaminophen (TYLENOL) tablet 1,000 mg  Once     Route: Oral  Ordered Dose: 1,000 mg     Last MAR action: Given GIRMA, BELETSHACHEW            Procedures:      Procedures      Interpretations:                     RESULTS        Lab Results:      Results     Procedure Component Value Units Date/Time    Magnesium [[id number]]  (Abnormal) Collected: 05/23/19 1637    Specimen: Blood Updated:  05/23/19 2051     Magnesium 1.2 mg/dL     Lipase [[id number]] Collected: 05/23/19 1637    Specimen: Blood Updated: 05/23/19 2051     Lipase 11 U/L     TSH [[id number]] Collected: 05/23/19 1637    Specimen: Blood Updated: 05/23/19 1808     TSH 1.09 uIU/mL     Comprehensive metabolic panel [[id number]]  (Abnormal) Collected: 05/23/19 1637    Specimen: Blood Updated: 05/23/19 1748     Glucose 114 mg/dL      BUN 5.0 mg/dL      Creatinine 0.9 mg/dL      Sodium 629 mEq/L      Potassium 3.5 mEq/L      Chloride 99 mEq/L      CO2 20 mEq/L      Calcium 9.4 mg/dL      Protein, Total 7.8 g/dL      Albumin 4.2 g/dL      AST (SGOT) 30 U/L      ALT 19 U/L      Alkaline Phosphatase 156 U/L      Bilirubin, Total 0.8 mg/dL      Globulin 3.6 g/dL      Albumin/Globulin Ratio 1.2     Anion Gap 12.0    Ethanol (Alcohol) Level [[id number]] Collected: 05/23/19 1637    Specimen: Blood Updated: 05/23/19 1748     Alcohol None Detected mg/dL     Acetaminophen level [[id number]]  (Abnormal) Collected: 05/23/19 1637    Specimen: Blood Updated: 05/23/19 1748     Acetaminophen Level <7 ug/mL     Salicylate level [[id number]]  (Abnormal) Collected: 05/23/19 1637    Specimen: Blood Updated: 05/23/19 1748     Salicylate Level <5.0 mg/dL     GFR [[id number]] Collected: 05/23/19 1637     Updated: 05/23/19 1748     EGFR >60.0    Urine Rapid Drug Screen [[id number]]  (Abnormal) Collected: 05/23/19 1637    Specimen: Urine Updated: 05/23/19 1726     Urine Amphetamine Screen Negative     Barbiturate Screen, UR Positive     Benzodiazepine Screen, UR Negative     Cannabinoid Screen, UR Negative     Cocaine, UR Negative     Opiate Screen, UR Negative     PCP Screen, UR Negative    COVID-19 (SARS-COV-2) ([place] Rapid)- Behavioral health admission (no isolation) [[id number]] Collected: 05/23/19 1637    Specimen: Nasopharyngeal Swab from Nasopharynx Updated: 05/23/19 1726     Purpose of COVID testing Screening     SARS-CoV-2 Specimen Source Nasopharyngeal     SARS CoV 2  Overall Result Negative    Narrative:      o Collect and clearly label specimen type:  o Upper respiratory specimen: One Nasopharyngeal Dry  Swab NO  Transport Media.  o Hand deliver to laboratory ASAP  Indication for testing->Behavioral health admission    UA Reflex to Micro - Reflex to Culture [[id number]] Collected: 05/23/19 1637     Updated: 05/23/19 1716     Urine Type Urine, Clean Ca     Color, UA Straw     Clarity, UA Clear     Specific Gravity UA 1.005     Urine pH 7.0     Leukocyte Esterase, UA Negative     Nitrite, UA Negative     Protein, UR Negative     Glucose, UA Negative     Ketones UA Negative     Urobilinogen, UA Normal mg/dL      Bilirubin, UA Negative     Blood, UA Negative    Narrative:      Replace urinary catheter prior to obtaining the urine culture  if it has been in place for greater than or equal to 14  days:->N/A No Foley  Indications for U/A Reflex to Micro - Reflex to  Culture:->Suprapubic Pain/Tenderness or Dysuria    CBC and differential [[id number]]  (Abnormal) Collected: 05/23/19 1637    Specimen: Blood Updated: 05/23/19 1716     WBC 4.42 x10 3/uL      Hgb 10.2 g/dL      Hematocrit 54.2 %      Platelets 148 x10 3/uL      RBC 3.45 x10 6/uL      MCV 93.0 fL      MCH 29.6 pg      MCHC 31.8 g/dL      RDW 17 %      MPV 9.9 fL      Neutrophils 60.7 %      Lymphocytes Automated 27.1 %      Monocytes 11.1 %      Eosinophils Automated 0.7 %      Basophils Automated 0.2 %      Immature Granulocytes 0.2 %      Nucleated RBC 0.0 /100 WBC      Neutrophils Absolute 2.68 x10 3/uL      Lymphocytes Absolute Automated 1.20 x10 3/uL      Monocytes Absolute Automated 0.49 x10 3/uL      Eosinophils Absolute Automated 0.03 x10 3/uL      Basophils Absolute Automated 0.01 x10 3/uL      Immature Granulocytes Absolute 0.01 x10 3/uL      Absolute NRBC 0.00 x10 3/uL     Lipase [[id number]] Collected: 05/23/19 1637    Specimen: Blood Updated: 05/23/19 1640    Magnesium [[id number]] Collected: 05/23/19 1637     Specimen: Blood Updated: 05/23/19 1640              Radiology Results:      No orders to display              Visit date: 05/23/2019      CLINICAL SUMMARY          Diagnosis:      Final diagnoses:   Alcohol dependence with unspecified alcohol-induced disorder         MDM Notes:      I am the first provider for this patient.  I reviewed the vital signs, nursing notes, past medical history, past surgical history, family history and social history.  I have reviewed the patient's previous charts.    59 year old female requesting detox from alcohol drinks 1 bottle of vodka per day.  No prior detox.  Labs and Covid pending.  Patient states she called cats prior to coming in.    6 PM.  Patient medically cleared for cats admission  Blood pressure elevated.  Patient states normally takes lisinopril 5 mg/day and did not take her daily dose.  States was on higher dosage prior but dropped her blood pressure too much.  Ordered lisinopril 5 mg.  Tox screen came back positive for barbiturates.  Patient states not taking any barbiturates.  Chart review last emergency room visit patient got phenobarbital    415pm NP did not respond to secure chat will page CATs physician on call  610pm page [facility] on call  No answer  [phone] spectralink  715 pm 2nd page to [facility]S on call       Disposition:         Discharge to [facility]S; Accepting physician Dr. Derryl Harbor            Scribe Attestation:      Scribe was not involved in the care of this patient.                  Samuel Bouche, MD  05/26/19 [phone]

## 2019-05-23 NOTE — ED Triage Notes (Signed)
Pt arrives by her own will for CATS program. Pt drinks half a gallon of vodka over three days. Last use was four days ago. Pt endorses Nausea, a HA and joint pains. Pt is alert and oriented. Denies HI/ SI

## 2019-05-24 ENCOUNTER — Encounter: Payer: Self-pay | Admitting: Psychiatry

## 2019-05-24 DIAGNOSIS — F10239 Alcohol dependence with withdrawal, unspecified: Principal | ICD-10-CM

## 2019-05-24 DIAGNOSIS — F419 Anxiety disorder, unspecified: Secondary | ICD-10-CM

## 2019-05-24 LAB — ECG 12-LEAD
Atrial Rate: 67 {beats}/min
P Axis: 44 degrees
P-R Interval: 178 ms
Q-T Interval: 428 ms
QRS Duration: 82 ms
QTC Calculation (Bezet): 452 ms
R Axis: 31 degrees
T Axis: 47 degrees
Ventricular Rate: 67 {beats}/min

## 2019-05-24 MED ORDER — MAGNESIUM OXIDE 400 MG TABS (WRAP)
400.00 mg | ORAL_TABLET | Freq: Two times a day (BID) | ORAL | Status: AC
Start: 2019-05-24 — End: 2019-05-27
  Administered 2019-05-24 – 2019-05-27 (×8): 400 mg via ORAL
  Filled 2019-05-24 (×9): qty 1

## 2019-05-24 NOTE — Plan of Care (Signed)
Problem: Addiction to alcohol or opioids or other substances AS EVIDENCED BY:  Goal: Patient educated about the disease of addiction and recovery and identifies long-term goal  Outcome: Progressing  Patient was mostly isolative to her room the entire shift. She was encouraged to be part of groups, increase fluids intake, and work on identifying goals for current admission. Her vital signs were within normal limit. Patient was assessed to be calm and cooperative, yet complaining of left arm pain, for which she received Ibuprofen 600mg  at both 1133 and 1747 with good effect. Patient denies SI/HI/SIB at this time. Will continue to monitor and follow current nursing and medical plan of care.

## 2019-05-24 NOTE — H&P (Signed)
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 female who presents to the hospital with alcohol withdrawal  Patient was admitted to [facility] inpatient detox unit after medically cleared by the ER  Patient reports drinking 1/4th of Vodka daily  Patient denies any withdrawal seizures or hallucinations but admits to blockout, recent fall with bruise around her eyes   First treatment episode   On disability due to pancreatitis   Live withy a roommate who is also heavy drinker       Past Medical History:     Past Medical History:   Diagnosis Date    Asthma without status asthmaticus     Disorder of liver     multifocal cystic irregularities    DVT (deep venous thrombosis)     Gastroesophageal reflux disease     H/O ETOH abuse     sober 2 years until 03/2014    Hypertension     Pancreatitis        Past Surgical History:     Past Surgical History:   Procedure Laterality Date    CHOLECYSTECTOMY      COLONOSCOPY  12/26/2012    Procedure: COLONOSCOPY;  Surgeon: Gwenith Spitz, MD;  Location: [facility];  Service: Gastroenterology;  Laterality: N/A;    COLONOSCOPY, POLYPECTOMY  12/26/2012    Procedure: COLONOSCOPY, POLYPECTOMY;  Surgeon: Gwenith Spitz, MD;  Location: [facility];  Service: Gastroenterology;  Laterality: N/A;    DEBRIDEMENT & IRRIGATION, WOUND CLOSURE Right 08/29/2018    Procedure: WASHOUT AND  CLOSURE OF TRAUMATIC WOUND, RIGHT FOREARM;  Surgeon: Arnoldo Hooker, MD;  Location: [facility];  Service: General;  Laterality: Right;  Right forearm I&D poss. wound closure    ORIF, HUMERUS, DISTAL Left 04/08/2019    Procedure: ORIF, HUMERUS, DISTAL;  Surgeon: Randolm Idol, MD;  Location: [facility];  Service: Orthopedics;  Laterality: Left;  ORIF LEFT DISTAL HEMERUS    PANCREATECTOMY  01/2011    partial    THORACENTESIS Right 2012    pl effusion       Family History:     Family History   Adopted: Yes       Social History:     Social History     Socioeconomic History    Marital status: Single     Spouse name: Not on file    Number of children: Not on file    Years of education: Not on file    Highest education level: Not on file   Occupational History    Not on file   Social Needs    Financial resource strain: Not on file    Food insecurity     Worry: Not on file     Inability: Not on file    Transportation needs  Medical: Not on file     Non-medical: Not on file   Tobacco Use    Smoking status: Current Every Day Smoker     Packs/day: 0.50     Years: 30.00     Pack years: 15.00     Types: Cigarettes    Smokeless tobacco: Never Used    Tobacco comment: 1 pack every 3 days   Substance and Sexual Activity    Alcohol use: Yes     Comment: 4-5 mixed drinks per day, 1/2 5th liqour    Drug use: No    Sexual activity: Not on file   Lifestyle    Physical activity     Days per week: Not on file     Minutes per session: Not on file    Stress: Not on file   Relationships    Social connections     Talks on phone: Not on file     Gets together: Not on file     Attends religious service: Not on file     Active member of club or organization: Not on file     Attends meetings of clubs or organizations: Not on file     Relationship status: Not on file    Intimate partner violence     Fear of current or ex partner: Not on file     Emotionally abused: Not on file     Physically abused: Not  on file     Forced sexual activity: Not on file   Other Topics Concern    Not on file   Social History Narrative    Not on file       Allergies:     Allergies   Allergen Reactions    Norvasc [Amlodipine] Other (See Comments)     Unsteady on feet    Morphine Hallucinations    Oxycodone Headaches     severe       Medications:     Medications Prior to Admission   Medication Sig    albuterol (PROVENTIL HFA;VENTOLIN HFA) 108 (90 Base) MCG/ACT inhaler Inhale 2 puffs into the lungs every 6 (six) hours as needed for Wheezing    Cholecalciferol (Vitamin D3) 1.25 MG (50000 UT) Cap Take 1 capsule by mouth once a week (Patient taking differently: Take 1 capsule by mouth Once each week on Monday   )    fluticasone (FLONASE) 50 MCG/ACT nasal spray 1 spray by Nasal route daily as needed (allergies)    folic acid (FOLVITE) 1 MG tablet Take 1 mg by mouth every morning    furosemide (LASIX) 40 MG tablet Take 1 tablet (40 mg total) by mouth daily as needed (swelling in feet or weight gain)    lisinopril (ZESTRIL) 5 MG tablet Take 5 mg by mouth every morning    magnesium oxide (MAG-OX) 400 MG tablet Take 2 tablets (800 mg total) by mouth 3 (three) times daily with meals    Multiple Vitamins-Minerals (MULTIVITAMIN WITH MINERALS) tablet Take 1 tablet by mouth every morning.       ondansetron (ZOFRAN-ODT) 4 MG disintegrating tablet Take 1 tablet (4 mg total) by mouth every 12 (twelve) hours as needed for Nausea    pancrelipase, Lip-Prot-Amyl, (Creon) 24000-76000 units Cap DR Particles Take 72,000 units of lipase by mouth 3 (three) times daily with meals Take 3 capsules (72000 units of lipase) 3 times daily with meals    pantoprazole (PROTONIX) 40 MG tablet Take 40 mg by mouth every  morning    sodium bicarbonate 650 MG tablet Take 1 tablet (650 mg total) by mouth 3 (three) times daily    spironolactone (ALDACTONE) 50 MG tablet Take 50 mg by mouth every morning    thiamine (B-1) 100 MG tablet Take 100 mg by mouth every  morning       Review of Systems:   A comprehensive review of systems was:    Psychological ROS: positive for - anxiety  Respiratory ROS: no cough, shortness of breath, or wheezing  Cardiovascular ROS: no chest pain or dyspnea on exertion  Gastrointestinal ROS: no abdominal pain, change in bowel habits, or black or bloody stools  Neurological ROS: positive for - tremors    Physical Exam:     Vitals:    05/24/19 1147   BP: 102/66   Pulse: 83   Resp: 16   Temp:    SpO2: 96%       Intake and Output Summary (Last 24 hours) at Date Time  No intake or output data in the 24 hours ending 05/24/19 1230       General Appearance:    Alert, cooperative, no distress, appears stated age   Head:    Normocephalic, without obvious abnormality, atraumatic   Eyes:    PERRL, conjunctiva/corneas clear, EOM's intact,        Ears:    Normal TM's and external ear canals, both ears   Nose:   Nares normal, septum midline, mucosa normal, no drainage    or sinus tenderness   Throat:   Lips, mucosa, and tongue normal; teeth and gums normal   Neck:   Supple, symmetrical, trachea midline, no adenopathy;        thyroid:  No enlargement/tenderness/nodules; no carotid    bruit or JVD     Mental Status Exam:   Appearance: No apparent distress  Behavior/relationship to examiner/demeanor:  Cooperative and Landscape architect activity/EPS:  Normal  Gait:   Normal  Speech rate:  Normal  Speech volume:   Normal  Speech articulation:   Normal  Speech coherence:   Normal  Speech spontaneity:   Normal  Mood (subjective report):   euthymic  Affect (objective appearance):  Appropriate/mood-congruent  Thought Process (Associations):   Logical  Thought process (Rate):   Normal  Thought content:   No evidence of suicidal or homicidal ideation  Abnormal Perception:  None  Sensorium:   Alert  Attention/Concentration: Normal  Memory:   Immediate recall intact  Computation:   Intact  Language:  Intact  Fund of Knowledge/Intelligence:   Average  Abstraction:    Normal  Insight:   Fair  Judgment:   Fair        Clinical Diagnosis:   Alcohol use disorder  Alcohol withdrawal   Nicotine dependence   Anxiety  Depression     This patient has been informed of their individual treatment plan.  This includes an explanation of the action of all prescribed psychoactive medications.    List of Prescribed Psychoactive Medications (Antidepressants, Mood Stabilizers, Antipsychotics, Antianxiety, Stimulants):   Librium, trazadone,    The benefits, potential side effects, risks, and possible drug interactions were explained to this patient who indicated that she understood and agrees to the plan of care.   All patient questions were answered.          Labs:     Results     Procedure Component Value Units Date/Time    VRE culture [[id number]] Collected: 05/24/19 0014  Specimen: Genital from Rectal Swab Updated: 05/24/19 0251          Signed by: Jacqualyn Posey

## 2019-05-24 NOTE — Progress Notes (Signed)
Daily Note:    Affect/Mood:  Mildly anxious, Pleasant    Thought Process:  Logical and Goal Directed    Thought Content:  Within Normal Limits    Interpersonal:  Discussed Issues, Attentive and Provided Feedback      Treatment Plan Goal:  Assist pt to identify long and short term goals based on needs and strengths      Patient Participation in Groups:  [name] has not attended any scheduled groups today and was excused as is reporting withdrawal symptoms. This Clinical research associate met with [name] in her room to make therapeutic connection and orient to the counseling process and group expectations- therapeutic materials relating to same provided along with a step-1 worksheet. [name] was cooperative and willing to discuss her stressors and plans going forward. She reported that she lives in a high risk environment where her room mate is her companion drinker and buys the drikns as has more financial means thus her increased intake. [name] is drinking one gallon of liquor every 3 days with last drink the day before admission. She reported that she reached out to a friend who accompanied her to treatment and has offered her a room in his home post detox. She stated having accepted urgency of her and it's emerging consequences as evidenced by recent fall where she fractured her elbow with return to drinking post surgery. She has not established a SUD aftercare plan though open to recommendations. The plan is provide [name] with referrals in the [place] area and support her decision.

## 2019-05-24 NOTE — Progress Notes (Signed)
Initial Nursing Assessment/Intake  Date:  05/24/2019  Time: 2:09 AM  [name] [id number]:  A 59 y.o. female was assessed, for admission to Inpatient [facility].    Reason:  [name] has relapsed to alcohol.  She had two months of sobriety until about 6 weeks ago when she fell and fractured her elbow.  She began drinking again post surgery.  She has been drinking 1/2 gallon of vodka every 2-3 days.  Last drink was 05/21/19.  She denies history of seizures or DT's but does drink to blackout.  She denies history of AVH.  She denies SI/HI.  She has been drinking heavily since her son's death over 20 years ago and states that her longest period of sobriety has been two months.        Current BAL/UDS:    Common Labs 05/23/2019   Breathalyzer Positive/Negative: Negative        Current V/S:    Vitals:    05/23/19 2216   BP: 129/85   Pulse: 75   Resp: 16   Temp: 97.9 F (36.6 C)   SpO2: 97%       Current known Allergies:  Norvasc [amlodipine], Morphine, and Oxycodone    Patient [facility] admitted from [facility] ED.    Problem and Patient Goal  What is the problem you are trying to resolve?: Alcohol Detox  What made you decide to come in for treatment at this time?: "They told me if I didn't stop drinking I would die."  How do you feel about your mental/health substance use issue?: "I'm an alcoholic."  When was the last time the current mental health/substance abuse issue was manageable?: "I can't remember."  What is your goal for Treatment?: Detox  Status: Voluntary  Status Information Provided by: patient, chart documents  What, if any, issues have you been experiencing with your medications?: Norvasc made her hyptensive, made have lead to a fall with fx                Current Medications:    Current or Recent Stressors:  Impact of Substance Abuse/Mental Health Issue  How has your job been affected?: on disability  Who is the EAP Representative involved in your referral?: N/A  How has your MH/SA affected your financial status?:  "It's been tight, but my roomate has been buying for both of [name]."  How has your MH/SA affected your relationships?: "It's ended them."  Do you have any recent legal concerns?: Denies  Probation/Parole Officer Name (if applicable): N/A    HOH:  [name] is not Hearing Impaired  Special needs paperwork completed? Yes    Belongings searched?  Yes.    History:   Past Psychiatric History:   Previous diagnoses:  No  Previous suicide attempts:  No  Current risk (TASR- risk assessment):    Tool for Assessment of Suicide Risk  Individual Risk Profile: chronic medical illness  Symptom Risk Profile: depressive symptoms  Interview Risk Profile: recent substance abuse  Protective Factors: internalized teachings against suicide, supportive significant other  Level of Suicide Risk: Low       Current suicide risk (CSSR-S Screen and SAFE-T Risk Assessment):        [place] Suicide Severity Rating Scale (Short Version)  1. In the past month - Have you wished you were dead or wished you could go to sleep and not wake up?: No  2. In the past month - Have you actually had any thoughts of killing yourself?: No  6. Have you ever done anything, started to do anything, or prepared to do anything to end your life: No  CSSRS Risk Level : No risk    Specific Questioning About Thoughts, Plans, and Suicidal Intent  How many times have you had these thoughts? (Past Month): Does not apply  When you have the thoughts how long do they last? (Past Month): Does not apply  Could/can you stop thinking about killing yourself or wanting to die if you want to? (Past Month): Does not attempt to control thoughts  Are there things - anyone or anything (e.g. family, religion, pain of death) - that stopped you from wanting to die or acting on thoughts of suicide? (Past Month): Does not apply  What sort of reasons did you have for thinking about wanting to die or killing yourself?  Was it to end the pain or stop the way you were feeling (in other words you  couldnt go on living with this pain or how you were feeling) or was it to get attention: Does not apply  Total Score of Intensity of Ideation: 0    Step 2: Identify Risk Factors  Clinical Status (Current/Recent): Insomnia  Clinical Status (Lifetime): Substance/Alcohol Abuse or Dependence  Precipitants/Stressors: Substance intoxication or withdrawal, Recent inpatient discharge  Treatment History / Dx: Non-adherent to treatment or not receiving treatment  Access to lethal methods: No, does not have access to lethal methods    Step 3: Identify Protective Factors  Internal: Frustration tolerance, Religious beliefs, Identifies reasons for living  External: Cultural, spiritual and/or moral attitudes against suicide, Responsibility to family or others, living with family, Beloved pets, Supportive social networks or family    Step 4: Guidelines to Determine Level of Risk  Suicide Risk Level: Low    Step 5: Possible Interventions to LOWER Risk Level  Behavioral Health Inpatient Unit: L/M/H - Reassess Q shift - C-SSRS frequent screener    Step 6: Documentation  Summary of Evaluation: Pt denies SI/HI or history of SIB.  Presents voluntarily for treatment.    Self-injurious behavior/risky behavior:  Self-Harm Assessment  Self-Harm History: No  Family Suicide History: No  Safety Plan:: Yes, See progress notes  Deterrents: Significant other(s)  Triggers: Unknown    Substance Abuse History:  Detailed Drug Use History  Any current alcohol and/or drug use?: Yes  Reason for Alcohol or Drug Use: dependance, grief  Alcohol Use: In Lifetime             Age first used alcohol:  : 38            Max used in a day: 1/4 gallon of vodka            Pattern of use:: 1/2 gallon of vodka over 2 days            Last use:: 05/21/19            Longest Abstinence:  : 2 months  Tranquilizers/Benzodiazepines:: N/A  Tranquilizers/Benzodiazepines #2:: N/A  Tranquilizers/Benzodiazepines #3: N/A  Heroin Use:: N/A  Rx Opiates #1: : N/A  Rx Opiates #2: :  N/A  Rx Opiates #3: : N/A  Opiates Use #4:: N/A  Opiates Use #5:: N/A  Opiates Use #6:: N/A  Non-Rx Methadone Use: N/A  Amphetamine Use:: N/A  Marijuana Use: N/A  Cocaine Use: In Lifetime            Age first used cocaine:: 30  Years of use:: 9 months            Max used in a day: unsure            Pattern of use:: dialy            Last use:: 1984            Longest Abstinence:  : years  Hallucinogens Use: N/A  Inhalants Use: N/A  PCP Use: N/A  Nicotine Use: N/A  Barbiturates Use: N/A  Other Substance Abuse: N/A  Other Substance Abuse #2:: N/A  Other Substance Abuse #3:: N/A  Caffeine use:: N/A  Legal consequences of chemical/alcohol use?   No  Use of OTC medications:   Yes Ibuprofen  Additional historical information includes:    Withdrawal Symptoms  Current Withdrawal Symptoms: Tremors, Anxiety  Past Withdrawal Symptoms: Anxiety, Irritability, Tremors, Sweats, Fatigue  History of Hallucinations?: 0  History of Withdrawal Seizures?: No  History of DT's?: No  History of Blackouts?: Yes  When were blackouts?: 05/20/19     Review Of Systems:   Medical Review Of Systems:  General Medical  Do you use any alternative therapies?: No  Have you had a blood test to determine if you have Hepatitis?: Yes  Have you had a vaccination for Hepatitis A&B?: No    Current Evaluation:   Presenting Mental Status  Orientation Level: Oriented X4  Memory: Recent memory impaired  Thought Content: normal  Thought Process: tangential  Behavior: normal  Consciousness: Alert  Impulse Control: normal  Perception: normal  Eye Contact: normal  Attitude: cooperative  Mood: anxious  Hopelessness Affects Goals: No  Hopelessness About Future: No  Affect: normal  Speech: normal  Concentration: normal  Insight: fair  Judgment: fair  Appearance: unkempt  Appetite: decreased  Weight change?: normal  Energy: decreased  Sleep: difficulty falling asleep  Reliability of Reporter/Patient: good     Sleep  Sleep Pattern: Difficulty falling asleep,  Disturbed/interrupted sleep, Restlessness  Average Number of Sleep Hours: 4 Hours  Restful Sleep: No (Comment)  Difficulty Falling Asleep: Yes (Comment)  Difficulty Staying Asleep: Yes (Comment)  Difficulty Arising: Yes (Comment)  Use of Sleep Aids: Drinking  Energy: Decreased  Energy Changes:: Loss of motivation, Isolation/Withdrawal from family/friends, Loss of interest in activities  If changes, when did changes begin?: Last year    Appetite:  Nutritional Status:  Eats fewer than 2 meals per day?: Yes  Poor appetite 7 days prior to admission?: Yes  Food intolerance/cultural preference?: No  Eating disorder?: No  Impaired chewing or swallowing?: No  Unintentional 10 lb loss/gain in past month: 0  Any special dietary requirement?: No  How would you describe your appetite in the past month?: "It sucks when I'm drinking."  If yes to any of the above, was a referral to PCP given:: (!) No referral given    Physical/Somatic Complaints Pain:  Chronic Pain Issues  Physical Pain: yes  Location of Pain: L elbow  Chronic or Acute Pain?: chronic  Current Level of Pain 1-10: Discomforting - moderate pain  How have you managed pain?: ibuprofen  Have you contacted a physician about your pain?: yes (comment)  Referral to PCP or other provided at time of BPS or discharge?: No      Assessment - Diagnosis - Goals:        Treatment Plan/Recommendations: Inpatient Detoxification  Admit to [facility] Under the care of Dr. Richardson Chiquito.    Maree Erie RN

## 2019-05-24 NOTE — Progress Notes (Signed)
[name]   Integrated Summary of Assessments    Patient Name:  [name], [name]  Date of Birth: 1960/06/07  Medical Record Number:  [id number]  Level of Care: Inpatient  Admission Date: 05/23/2019    What is the problem you are trying to resolve:  What is the problem you are trying to resolve?: Alcohol Detox    Made Made You Decide to Come in for Treatment at this Time:   What made you decide to come in for treatment at this time?: "They told me if I didn't stop drinking I would die."    Patient's Goal for Treatment:  What is your goal for Treatment?: Detox    How do you feel about your mental health/substance use issue:  How do you feel about your mental/health substance use issue?: "I'm an alcoholic."    Initial Justification for Treatment: [name] is a 59 y.o. co-habitating Caucasian female presenting with diagnosis of alcohol use disorder;seere, alcohol withdrawal. This was patient's first treatment episode.    Detailed Drug Use History  Any current alcohol and/or drug use?: Yes  Reason for Alcohol or Drug Use: dependance, grief  Alcohol Use: In Lifetime             Age first used alcohol:  : 25            Max used in a day: 1/4 gallon of vodka            Pattern of use:: 1/2 gallon of vodka over 2 days            Last use:: 05/21/19            Longest Abstinence:  : 2 months  Tranquilizers/Benzodiazepines:: N/A  Tranquilizers/Benzodiazepines #2:: N/A  Tranquilizers/Benzodiazepines #3: N/A  Heroin Use:: N/A  Rx Opiates #1: : N/A  Rx Opiates #2: : N/A  Rx Opiates #3: : N/A  Opiates Use #4:: N/A  Opiates Use #5:: N/A  Opiates Use #6:: N/A  Non-Rx Methadone Use: N/A  Amphetamine Use:: N/A  Marijuana Use: N/A  Cocaine Use: In Lifetime            Age first used cocaine:: 30            Years of use:: 9 months            Max used in a day: unsure            Pattern of use:: dialy            Last use:: 1984            Longest Abstinence:  : years  Hallucinogens Use: N/A  Inhalants Use: N/A  PCP Use:  N/A  Nicotine Use: N/A  Barbiturates Use: N/A  Other Substance Abuse: N/A  Other Substance Abuse #2:: N/A  Other Substance Abuse #3:: N/A  Caffeine use:: N/A.     Current Withdrawal Symptoms: Tremors, Anxiety  Past Withdrawal Symptoms: Anxiety, Irritability, Tremors, Sweats, Fatigue  History of Blackouts?: Yes  History of Withdrawal Seizures?: No    Biopsychosocial Summary of Assessments:   At time of assessment, patient does not report current SI/HI. Patient does not report past SI/HI. Patient denies history of self-harm or self-injurious behavior. Patient denies family history of suicide. Patient reports history of substance use/mental health problems. Patient does not report abuse.     Patient reports current living situation is single family home and that she lives with room mate in [place]. Patient  reports she participates in gardening, reading and needle point for leisure activities. Patient describes current social relationships as "ended some though family and SO supportive". Patient denies legal history. Patient reports highest   . Patient is on Disability. Patient does not report job/educational consequences as a result of her mental health/substance use. Patient does report financial problems currently.     Patient Stage of Change:  Patient presents in a contemplative stage of change as evidenced by pt's willingness to accept recommendations to enter treatment    Clinical Diagnosis:  See H & P

## 2019-05-24 NOTE — Progress Notes (Signed)
[facility] TREATMENT HISTORY    Name Dates Presenting Problem Outcome/Years    [facility] IP 05/24/19 Alcohol Current

## 2019-05-25 LAB — LIPID PANEL
Cholesterol / HDL Ratio: 2.4
Cholesterol: 216 mg/dL — ABNORMAL HIGH (ref 0–199)
HDL: 90 mg/dL (ref 40–9999)
LDL Calculated: 104 mg/dL — ABNORMAL HIGH (ref 0–99)
Triglycerides: 108 mg/dL (ref 34–149)
VLDL Calculated: 22 mg/dL (ref 10–40)

## 2019-05-25 LAB — POTASSIUM: Potassium: 3.8 mEq/L (ref 3.5–5.1)

## 2019-05-25 LAB — HEMOLYSIS INDEX: Hemolysis Index: 8 (ref 0–18)

## 2019-05-25 MED ORDER — PANCRELIPASE (LIP-PROT-AMYL) 24000-76000 UNITS PO CPEP
1.00 | ORAL_CAPSULE | Freq: Three times a day (TID) | ORAL | Status: DC
Start: 2019-05-25 — End: 2019-05-28
  Administered 2019-05-25 – 2019-05-28 (×9): 24000 [IU] via ORAL
  Filled 2019-05-25 (×12): qty 1

## 2019-05-25 MED ORDER — SODIUM BICARBONATE 650 MG PO TABS
650.0000 mg | ORAL_TABLET | Freq: Three times a day (TID) | ORAL | Status: DC
Start: 2019-05-25 — End: 2019-05-28
  Administered 2019-05-25 – 2019-05-28 (×9): 650 mg via ORAL
  Filled 2019-05-25 (×12): qty 1

## 2019-05-25 MED ORDER — KETOROLAC TROMETHAMINE 60 MG/2ML IM SOLN
30.00 mg | Freq: Every day | INTRAMUSCULAR | Status: DC | PRN
Start: 2019-05-25 — End: 2019-05-28
  Administered 2019-05-25 – 2019-05-27 (×3): 30 mg via INTRAMUSCULAR
  Filled 2019-05-25 (×4): qty 2

## 2019-05-25 NOTE — Progress Notes (Signed)
Daily Note:    Affect/Mood:  Medical laboratory scientific officer Process:  Logical    Thought Content:  Within Normal Limits    Interpersonal:  Discussed Issues, Attentive and Limited Insight      Treatment Plan Goal:  Pt educated about the disease of addiction and recovery      Patient Participation in Groups:  [name] has attended 3/4 groups today: the [employer], education group and the virtual AA meeting.  Durrng the community meeting she reported feeling "emotionally I can't believe I'm here and physically stable". She reported that she was given a step-1 task and had not begun to work on it. She did not attend the  Virtual AA meeting and has not secured a sponsor though she reported that her ex-boyfriend is sober and could substitute. She was not open to education on the program rule about gender specific sponsors and the rationale for same- the group was helpful on the abovn. [name] reported that she was grateful "that I opened my eyes today".She attended the session on Blocks to listening and related to placating and rehearsing and gave examples of how she  Used above on co-workers. Met with [name] to assess her progress thus far and she repeated her gratitude for her presence. She appeared not open to the idea of aftercare and reported that she plans to relocate to [place] and there is limited programs in the area. She was urged to meet with SW staff  and discuss  challenges she had prior to this admission. Writer assured [employer] of plans to assist and support her efforts.

## 2019-05-25 NOTE — Plan of Care (Signed)
Patient is alert and oriented x 4, she denies SI/HI and A/V hallucinations. Patient denies most of the withdrawal symptoms such as: nausea, tremor , visual or auditory disturbances, and clouding sensorium. She is compliant with medication and others forms of treatment.

## 2019-05-25 NOTE — Progress Note - Problem Oriented Charting Notewrit (Signed)
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  Patient denies adverse effects or side effects to medications.  Patient's appetite has been "better."  Patient slept "okay."  AxO to person, place, date, situation    Medications:     Current Facility-Administered Medications   Medication Dose Route Frequency    folic acid  1 mg Oral Daily    furosemide  40 mg Oral Daily    lisinopril  5 mg Oral Daily    magnesium oxide  400 mg Oral BID    multivitamin  1 tablet Oral Daily    nicotine  1 patch Transdermal Daily    pantoprazole  40 mg Oral QAM AC    spironolactone  50 mg Oral Daily    thiamine  100 mg Oral Daily    tuberculin  0.1 mL Intradermal Once    vitamin D (cholecalciferol)  10 mcg Oral Daily       Review of Systems:   A comprehensive review of systems was:    Psychological ROS: positive for - anxiety  Respiratory ROS: no cough, shortness of breath, or wheezing  Cardiovascular ROS: no chest pain or dyspnea on exertion  Gastrointestinal ROS: no abdominal pain, change in bowel habits, or black or bloody stools  Neurological ROS: positive for - tremors    Physical Exam:     Vitals:    05/25/19 0739   BP: 120/79   Pulse: 76   Resp: 18   Temp: 97.5 F (36.4 C)   SpO2: 96%       Intake and Output Summary (Last 24 hours) at Date Time  No intake or output data in the 24 hours  ending 05/25/19 1233       General Appearance:    Alert, cooperative, no distress, appears stated age   Head:    Normocephalic, without obvious abnormality, atraumatic   Eyes:    PERRL, conjunctiva/corneas clear, EOM's intact,        Ears:    Normal TM's and external ear canals, both ears   Nose:   Nares normal, septum midline, mucosa normal, no drainage    or sinus tenderness   Throat:   Lips, mucosa, and tongue normal; teeth and gums normal   Neck:   Supple, symmetrical, trachea midline, no adenopathy;        thyroid:  No enlargement/tenderness/nodules; no carotid    bruit or JVD       Labs:     Results     Procedure Component Value Units Date/Time    Hemolysis index [[id number]] Collected: 05/25/19 0854     Updated: 05/25/19 1136     Hemolysis Index 8    Narrative:      Rescheduled by [id number] at 05/24/2019  09:09 Reason: Patient unavailable    Potassium [[id number]] Collected: 05/25/19 0854    Specimen: Blood Updated: 05/25/19 1136     Potassium 3.8 mEq/L     Narrative:      Rescheduled by [id number] at 05/24/2019 09:09 Reason: Patient unavailable    Lipid panel [[id number]]  (Abnormal) Collected: 05/25/19 0854    Specimen: Blood Updated: 05/25/19 1136     Cholesterol 216 mg/dL      Triglycerides 782 mg/dL      HDL 90 mg/dL      LDL Calculated 956 mg/dL      VLDL Calculated 22 mg/dL      Cholesterol / HDL Ratio 2.4    Narrative:      Rescheduled by [id number] at 05/24/2019 09:09 Reason: Patient unavailable    VRE culture [[id number]] Collected: 05/24/19 0014    Specimen: Genital from Rectal Swab Updated: 05/24/19 2320    Narrative:      ORDER#: [id number]                                    ORDERED BY: Royce Macadamia  SOURCE: Rectal Swab                                  COLLECTED:  05/24/19 00:14  ANTIBIOTICS AT COLL.:                                RECEIVED :  05/24/19 02:51  Culture VRE Surveillance                   FINAL       05/24/19 23:20  05/24/19   Negative for vancomycin resistant Enterococcus faecium and              Enterococcus faecalis          Signed by: Jacqualyn Posey

## 2019-05-25 NOTE — Plan of Care (Signed)
Problem: Safety  Goal: Patient will be free from injury during hospitalization  Outcome: Progressing   [name] was AOx4 during the shift, complied with scheduled medications and ate meals in her room. She was visible in the milieu throughout the day, ambulated occasionally in the halls. She was assessed via the CIWA and did not exhibit any symptoms of withdrawal .She remained free of injuries or falls during this shift and was able to make needs known. She denied any SI/HI/AVH. She endorsed pain in her left elbow for which she was provided with Toraldol 48m gIM at 1304 and Advil 600mg  at 1654 with good effect.

## 2019-05-26 NOTE — Progress Notes (Signed)
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   Hyperlipidemia        Plan:   Continue with Detoxification  Serax protocol  SA Counseling  Discussed with treatment team  Review labs with patients  IOP Triage  Discharge planning: TBD           Medications:     Current Facility-Administered Medications   Medication Dose Route Frequency    folic acid  1 mg Oral Daily    furosemide  40 mg Oral Daily    lisinopril  5 mg Oral Daily    magnesium oxide  400 mg Oral BID    multivitamin  1 tablet Oral Daily    nicotine  1 patch Transdermal Daily    pancrelipase (Lip-Prot-Amyl)  1 capsule Oral TID MEALS    pantoprazole  40 mg Oral QAM AC    sodium bicarbonate  650 mg Oral TID    spironolactone  50 mg Oral Daily    thiamine  100 mg Oral Daily    tuberculin  0.1 mL Intradermal Once    vitamin D (cholecalciferol)  10 mcg Oral Daily       Review of Systems:   A comprehensive review of systems was negative except for:   Respiratory: Negative  Cardiovascular: Negative  Gastrointestinal: Negative  Genitourinary: Negative  Musculoskeletal: Negative  Neurological: positive for tremors    Physical Exam:     Vitals:    05/26/19 0751   BP: (!) 153/102   Pulse: 68   Resp:    Temp: 97.3 F (36.3 C)   SpO2: 98%       General appearance - oriented to person, place, and time   Mental status - anxious  Eyes - pupils equal and reactive, extraocular eye movements intact  Ears - bilateral TM's and external ear canals normal  Nose - no septal perforation  Mouth - mucous membranes moist, pharynx normal without lesions  Neck - supple, no significant adenopathy  Chest - clear to auscultation, no wheezes, rales or rhonchi, symmetric air entry  Heart - normal rate, regular  rhythm, normal S1, S2, no murmurs, rubs, clicks or gallops  Abdomen - soft, nontender, nondistended, no masses or organomegaly  Neurological - alert, oriented, normal speech, normal muscle tone, mild tremors  Musculoskeletal - no joint tenderness, deformity or swelling  Extremities - peripheral pulses normal, no pedal edema, no clubbing or cyanosis  Skin - normal      Labs:     Results     Procedure Component Value Units Date/Time    Hemolysis index [[id number]] Collected: 05/25/19 0854     Updated: 05/25/19 1136     Hemolysis Index 8    Narrative:      Rescheduled by [id number] at 05/24/2019 09:09 Reason: Patient unavailable    Potassium [[id number]] Collected: 05/25/19 0854    Specimen: Blood Updated: 05/25/19 1136     Potassium 3.8 mEq/L     Narrative:      Rescheduled by [id number] at 05/24/2019 09:09 Reason: Patient unavailable    Lipid panel [[id number]]  (Abnormal) Collected: 05/25/19 0854    Specimen: Blood  Updated: 05/25/19 1136     Cholesterol 216 mg/dL      Triglycerides 161 mg/dL      HDL 90 mg/dL      LDL Calculated 096 mg/dL      VLDL Calculated 22 mg/dL      Cholesterol / HDL Ratio 2.4    Narrative:      Rescheduled by [id number] at 05/24/2019 09:09 Reason: Patient unavailable              Signed by: Nicholaus Bloom, MD      I certify that inpatient services are medically necessary for this patient due to Alcohol Withdrawal and the resultant risk to self.Marland Kitchen Please see H&P and MD progress notes for additional information about the patient's course of treatment. The patient continues to need active treatment furnished by and requiring the supervision of inpatient facility personnel. I certify that inpatient services are medically necessary for treatment which could reasonably be expected to improve the patient's condition. Please see H&P and MD progress notes for additional information about the patient's course of treatment. For Medicare patients, services to be provided in accordance with 412.3 and expected LOS to be  greater than 2 midnights for Medicare patients.

## 2019-05-26 NOTE — Treatment Plan (Signed)
Interdisciplinary Treatment Plan Update Meeting    05/26/2019  [name]    Participants:  Patient:  [name]  Attending Physician:  Nicholaus Bloom, MD  RN: Lorel Monaco.  Social Worker: Hawkin Charo I.    Objective:  Review response to treatment, reassess needs/goals, update plan as indicated incorporating patient's strengths and stated needs, goals, and preferences.    Socrates score:    Recognition:34-35 High recognition: Treatment team will assist [name] with making aftercare plans. [name] is ready to take steps.      Ambivalence:4-13 Low ambivalence: [name] has high scores on recognition and taking steps. [name] is ready to make aftercare plans and will be assisted by the treatment team.     Taking steps: 31-35 Taking Steps Medium Score: Treatment team will encourage [name] to connect aftercare appointments with sobriety, and will assist [name] with identifying consequences of not having an aftercare plan.    1. Summary of Patient Progress on Treatment Plan Goals:  [name] met with treatment team at bedside. [name] stated that she is feeling, "fine." [name] reported that prior to coming into detox, she was residing in [place], [place] with a roommate. However, [name] reported that she will be going to [place], [place] with a friend as she feels this will be a safer environment for her sobriety. [name] expressed that she would be interested in participating in outpatient services. TW will provide support for her after care plan.     Short Term Goals: Complete detox.    Long Term Goals: Attain long term sobriety.    2. Level of Patient Involvement:  Actively engaged/contributing    3. Patient Understanding of Plan of Care:  Able to verbalize goals and interventions    4. Level of Agreement/Commitment to Plan of Care:  Agrees with and is committed to plan of care        I have been informed that options for care are not limited to the goals and interventions identified in this  plan. This plan reflects my preferences for goals and services.  The risks and beneifits have been discussed with me.      Contributor Signatures:    Patient_______________________________ Date___________________    MD_________________________________ Date___________________    SW_________________________________Date ___________________    RN _________________________________Date____________________

## 2019-05-26 NOTE — Plan of Care (Signed)
Patient is calm and interacting with pears and staff, she presents with appropriate appearance congruent affect. She denies SI/HI and/or A/V hallucinations. She is compliant with medication and other treatments.  Patients B/P was on the high sie; 167/105 and 163/92  at 2137 (2/14) and 0304 (2/15) respectively. Both times she has been given clonidine 0.1 mg and serax, with effect.

## 2019-05-26 NOTE — Progress Notes (Signed)
Daily Note:    Affect/Mood:  Anxious and Appropriate     Thought Process:  Logical and Goal Directed    Thought Content:  Within Normal Limits    Interpersonal:  Discussed Issues, Attentive, Displayed Insight and Provided Feedback      Treatment Plan Goal:  Pt will identify short-term and long-term goals for recovery.       Patient Participation in Groups:  Pt has attended 4 groups thus far today: the community meeting, education group, the virtual AA meeting, and Task where she presented her Step One. During the community meeting, ind reported feeling "pretty well" both physically and emotionally and reported being grateful for "having the courage to take the first step" and voluntarily admit to the detox unit.    Pt stated she plans to work with unit SW to firm aftercare treatment that can be done on an outpatient basis. Ind stated she is moving in with a friend who will support her in her early recovery and she is looking forward to a "change in environment."     Pt denied additional needs or concerns at present and stated her goal for today is to "attend groups and continue to feel better."    Task Note: Pt presented the Step One task during task group today. Patient presented with anxious mood and mood-congruent and shared with the group examples relating to powerlessness and the devastating impact of alcohol abuse on his relationships, work life, and mental health. Patient appeared open to questions & feedback as evidenced by verbalizing appreciation for the support of peers. Patient's speech was normal and patient displayed goal directed thought processes. Pt displayed Good insight into the addiction & recovery process as well as his powerlessness over addiction and how his life had become unmanageable. Patient identified that "everything" took a backseat to her Alcohol abuse and reports she was consistently isolating at home. Patient summarized by stating "I am an irresponsible person. I am not happy with  myself and I need to be sober to get back the respect of others and to learn how to respect myself."  Pt acknowledged that total abstinence was the only way to be successful in recovery. Pt was receptive to feedback and reflective questions from staff and peers.     Laureen Ochs [place], [place]  Mental Health Therapist  [phone]

## 2019-05-26 NOTE — Plan of Care (Signed)
Energy manager: TW spoke to Pt in the milieu to provide her with [employer] information. TW was encouraged to contact this facility as soon as possible.    Pt spoke to TW in the CM office and was informed that [facility] has not substance abuse outpatient services. TW discussed Pt possibly getting involved with the [employer]. Pt was able to sign an ROI and TW will gather more information regarding services.

## 2019-05-26 NOTE — Plan of Care (Signed)
Problem: Safety  Goal: Patient will be free from injury during hospitalization  Outcome: Progressing  Goal: Patient will be free from infection during hospitalization  Outcome: Progressing     Problem: Moderate/High Fall Risk Score >5  Goal: Patient will remain free of falls  Outcome: Progressing     Problem: Pain interferes with ability to perform ADL  Goal: Pain at adequate level as identified by patient  Outcome: Progressing     Problem: Tobacco/Nicotine use/dependence: AS EVIDENCED BY...  Goal: Utilizes cessation medication to decrease tobacco/nicotine cravings and/or withdrawal  Outcome: Progressing     Problem: Addiction to alcohol or opioids or other substances AS EVIDENCED BY:  Goal: Will identify long and short term goals based on individual needs and strengths  Outcome: Progressing  Goal: Patient achieves a safe detoxification and management of withdrawal symptoms  Outcome: Progressing  Goal: Patient educated about the disease of addiction and recovery and identifies long-term goal  Outcome: Progressing   [name] is a 59 year old female admitted for treatment for alcohol abuse. Patient is alert, awake, and oriented x4. Patient denies suicidal ideation, thoughts of harming self and/or others, homicidal ideation. Ms. [place] is medication compliant and group compliant. The nurse will continue to monitor changes and safety concerns.

## 2019-05-27 MED ORDER — GABAPENTIN 300 MG PO CAPS
300.00 mg | ORAL_CAPSULE | Freq: Three times a day (TID) | ORAL | Status: DC
Start: 2019-05-27 — End: 2019-05-28
  Administered 2019-05-27 – 2019-05-28 (×3): 300 mg via ORAL
  Filled 2019-05-27 (×3): qty 1

## 2019-05-27 MED ORDER — TRAZODONE HCL 150 MG PO TABS
75.0000 mg | ORAL_TABLET | Freq: Every evening | ORAL | 0 refills | Status: DC | PRN
Start: 2019-05-27 — End: 2019-06-10

## 2019-05-27 MED ORDER — GABAPENTIN 300 MG PO CAPS
300.00 mg | ORAL_CAPSULE | Freq: Three times a day (TID) | ORAL | 0 refills | Status: DC
Start: 2019-05-27 — End: 2019-06-04

## 2019-05-27 MED ORDER — NICOTINE 21 MG/24HR TD PT24
1.00 | MEDICATED_PATCH | Freq: Every day | TRANSDERMAL | 0 refills | Status: DC
Start: 2019-05-28 — End: 2019-06-10

## 2019-05-27 NOTE — Plan of Care (Signed)
Case Management Note: TW spoke to Pt at bedside. TW was able to provide Pt with [facility] [place] contact information. Pt was able to sign an ROI. TW encouraged Pt to contact this facility as soon as possible. Pt's clinicals were sent.

## 2019-05-27 NOTE — Progress Notes (Signed)
Daily Note:    Affect/Mood:  Appropriate    Thought Process:  Logical and Goal Directed    Thought Content:  Within Normal Limits    Interpersonal:  Discussed Issues      Treatment Plan Goal:  Pt develops a discharge plan    Patient Participation in Groups:  Pt attended 2/5 groups so far today and plans to attend all groups. Therapist solicited participation in group readings and prompts--Pt contributed openly and willingly. Pt stated that she is "strong and ready" to discharge. Pt reported that she presented her Step One Task yesterday and felt "supported and listened to" and was "grateful for everyone sharing." Pt reported that she plans to seek aftercare at [facility] and seek a Sponsor. Therapist redirected Pt to personalize her statements and use less abstract language. Therapist met with Pt 1:1 to follow-up on after care planning and motivation to pursue long-term recovery. Therapist continued to redirect Pt toward "I statements" and personalized language as well as realistic thinking about path of recovery. Therapist will continue to educate, redirect, and support.     Kizzie Fantasia, NCC, LPC-R

## 2019-05-27 NOTE — Progress Notes (Signed)
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   Hyperlipidemia        Plan:   Continue with Detoxification  Serax protocol  SA Counseling  Discussed with treatment team  Review labs with patients  [facility] outpatient services planned  Discharge tomorrow           Medications:     Current Facility-Administered Medications   Medication Dose Route Frequency    folic acid  1 mg Oral Daily    furosemide  40 mg Oral Daily    lisinopril  5 mg Oral Daily    magnesium oxide  400 mg Oral BID    multivitamin  1 tablet Oral Daily    nicotine  1 patch Transdermal Daily    pancrelipase (Lip-Prot-Amyl)  1 capsule Oral TID MEALS    pantoprazole  40 mg Oral QAM AC    sodium bicarbonate  650 mg Oral TID    spironolactone  50 mg Oral Daily    thiamine  100 mg Oral Daily    tuberculin  0.1 mL Intradermal Once    vitamin D (cholecalciferol)  10 mcg Oral Daily       Review of Systems:   A comprehensive review of systems was negative except for:   Respiratory: Negative  Cardiovascular: Negative  Gastrointestinal: Negative  Genitourinary: Negative  Musculoskeletal: Negative  Neurological: positive for tremors    Physical Exam:     Vitals:    05/27/19 0919   BP: 128/87   Pulse: 93   Resp:    Temp:    SpO2:        General appearance - oriented to person, place, and time   Mental status - anxious  Eyes - pupils equal and reactive, extraocular eye movements intact  Ears - bilateral TM's and external ear canals normal  Nose - no septal perforation  Mouth - mucous membranes moist, pharynx normal without lesions  Chest - clear to auscultation, no wheezes, rales or rhonchi, symmetric air entry  Heart - normal rate, regular rhythm, normal S1, S2, no  murmurs, rubs, clicks or gallops  Abdomen - soft, nontender, nondistended, no masses or organomegaly  Neurological - alert, oriented, normal speech, normal muscle tone, mild tremors  Musculoskeletal - no joint tenderness, deformity or swelling  Extremities - peripheral pulses normal, no pedal edema  Skin - normal      Labs:     Results     ** No results found for the last 24 hours. **              Signed by: Nicholaus Bloom, MD      I certify that inpatient services are medically necessary for this patient due to Alcohol Withdrawal and the resultant risk to self.Marland Kitchen Please see H&P and MD progress notes for additional information about the patient's course of treatment. The patient continues to need active treatment furnished by and requiring the supervision of inpatient facility personnel. I certify that inpatient services are medically necessary for treatment which could reasonably be expected to improve the patient's condition. Please see H&P and MD progress notes for  additional information about the patient's course of treatment. For Medicare patients, services to be provided in accordance with 412.3 and expected LOS to be greater than 2 midnights for Medicare patients.

## 2019-05-27 NOTE — Discharge Summary (Signed)
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  She was on Serax protocol.  She was anxious and isolative.  She plans on aftercare in [place].  She has chronic pain in left arm from fractures.  She requested help with pain management.  She was started on Neurontin, and asked to follow up with PCP.  She will be discharged under reservation.  Prognosis is guarded.      Physical Examination:   General appearance - oriented to person, place, and time   Mental status - anxious   Eyes - pupils equal and reactive, extraocular eye movements intact  Ears - bilateral TM's and external ear canals normal  Nose - no septal perforation  Mouth - mucous membranes moist, pharynx normal without lesions  Neck - supple, no significant adenopathy  Chest - clear to auscultation, no wheezes, rales or rhonchi, symmetric air entry  Heart - normal rate, regular rhythm, normal S1, S2, no murmurs, rubs, clicks or gallops  Abdomen - soft, nontender, nondistended, no masses or organomegaly  Neurological - alert, oriented, normal speech, normal muscle tone, no tremors  Musculoskeletal - no joint tenderness on LUE  Extremities - peripheral pulses normal  Skin - normal      Discharge Medications:     Current Discharge Medication List      START taking these medications    Details   gabapentin (NEURONTIN) 300 MG capsule Take 1 capsule (300 mg total) by mouth every 8 (eight) hours  Qty: 45 capsule, Refills: 0      nicotine (NICODERM CQ) 21 MG/24HR Place 1 patch onto the skin daily  Qty: 14 patch,  Refills: 0      traZODone (DESYREL) 150 MG tablet Take 0.5 tablets (75 mg total) by mouth nightly as needed for Sleep  Qty: 15 tablet, Refills: 0         CONTINUE these medications which have NOT CHANGED    Details   albuterol (PROVENTIL HFA;VENTOLIN HFA) 108 (90 Base) MCG/ACT inhaler Inhale 2 puffs into the lungs every 6 (six) hours as needed for Wheezing      Cholecalciferol (Vitamin D3) 1.25 MG (50000 UT) Cap Take 1 capsule by mouth once a week  Qty: 12 capsule, Refills: 0    Associated Diagnoses: Vitamin D deficiency      fluticasone (FLONASE) 50 MCG/ACT nasal spray 1 spray by Nasal route daily as needed (allergies)      folic acid (FOLVITE) 1 MG tablet Take 1 mg by mouth every morning      furosemide (LASIX) 40 MG tablet Take 1 tablet (40 mg total) by mouth daily as needed (swelling in feet or weight gain)  Qty: 30 tablet, Refills: 5      lisinopril (ZESTRIL) 5 MG tablet Take 5 mg by mouth every morning      Multiple Vitamins-Minerals (MULTIVITAMIN WITH MINERALS) tablet Take 1 tablet by mouth every morning.  pancrelipase, Lip-Prot-Amyl, (Creon) 24000-76000 units Cap DR Particles Take 72,000 units of lipase by mouth 3 (three) times daily with meals Take 3 capsules (72000 units of lipase) 3 times daily with meals      pantoprazole (PROTONIX) 40 MG tablet Take 40 mg by mouth every morning      sodium bicarbonate 650 MG tablet Take 1 tablet (650 mg total) by mouth 3 (three) times daily  Qty: 90 tablet, Refills: 5      spironolactone (ALDACTONE) 50 MG tablet Take 50 mg by mouth every morning      thiamine (B-1) 100 MG tablet Take 100 mg by mouth every morning         STOP taking these medications       magnesium oxide (MAG-OX) 400 MG tablet        ondansetron (ZOFRAN-ODT) 4 MG disintegrating tablet                Discharge Instructions:   Discharge home  Follow-up with [facility].  SA Counseling  AA/NA  Follow up with PCP at discharge  Smoking Cessation Rx/Counseling provided.    I certify that inpatient  services are medically necessary for this patient. Please see H&P and MD progress notes for additional information about the patient's course of treatment. For Medicare patients, services provided in accordance with 412.3 and expected LOS to be greater than 2 midnights for Medicare patients.: Yes.I certify that inpatient services are medically necessary for treatment which could reasonably be expected to improve the patient's condition. Please see H&P and MD progress notes for additional information about the patient's course of treatment. For Medicare patients, services to be provided in accordance with 412.3 and expected LOS to be greater than 2 midnights for Medicare patients.            Signed by: Nicholaus Bloom, MD      Time:  29 mins.

## 2019-05-27 NOTE — Plan of Care (Signed)
Problem: Addiction to alcohol or opioids or other substances AS EVIDENCED BY:  Goal: Patient achieves a safe detoxification and management of withdrawal symptoms  Outcome: Progressing  Flowsheets (Taken 05/27/2019 0437)  Patient achieves a safe detoxification and management of withdrawal symptoms:   Assess withdrawal signs/symptoms according to identified protocol (e.g. CIWA/COWS)   Assess effectiveness (relief of withdrawal symptoms) and side effects of medication   Educate about health risks associated with withdrawal (e.g. seizures, DTs)   Ensure adequate hydration and nutrition during withdrawal  Note: [name] is being assessed for signs and symptoms of withdrawal from alcohol per unit protocol. She is being medicated for CIWA scores > 10 with Serax 15 mg.  On this shift she received one dose at 2159 with good effect. She was A&O x 4,denied SI, HI, or A/VH, and was able to contract for safety on this shift.  She was cooperative and compliant with prescribed medications and treatment plan.She remained free of falls or injuries during this shift. She is able to make her needs known.  Will continue to monitor for withdrawal symptoms and provide support and medications as needed.

## 2019-05-27 NOTE — Plan of Care (Addendum)
Problem: Addiction to alcohol or opioids or other substances AS EVIDENCED BY:  Goal: Patient achieves a safe detoxification and management of withdrawal symptoms  Outcome: Progressing   [name] has been pleasant the whole shift. She is alert and oriented X4. Denies any SI/HI. BP has been running high. Was medicated for same. Received motrin and IM Toradol X1 for pain to left elbow. Continues to be on contact isolation for VRE. MD discontinued order for VRE swab, pt to f/u with PCP. Received a new order for Neurontin. She also received serax X1. Lurena Joiner participated in all group meetings and ambulated independently on steady gait. Pt with no acute distress. Will continue to monitor.

## 2019-05-28 DIAGNOSIS — F329 Major depressive disorder, single episode, unspecified: Secondary | ICD-10-CM

## 2019-05-28 DIAGNOSIS — Z7141 Alcohol abuse counseling and surveillance of alcoholic: Secondary | ICD-10-CM

## 2019-05-28 NOTE — Discharge Instructions (Signed)
Post Hospital Continuing Care Plan, including the After Visit Summary (AVS) and the Physicians Discharge Summary can be viewed by anyone who has access to Epic    RECOMMENDED THAT YOU COMPLY WITH THE FOLLOWING PLAN:   Follow up appointment with: [facility]          Date: 05/28/2019      Time: 12:00PM  Location: [address] [place], [place] [address]  P: [phone]  F:     FOR EMERGENCY MENTAL HEALTH or Substance Abuse Appointment, CONTACT [phone]    Community Services Board:    [facility]  Phone Number: [phone]  Address: [address] [place], [place] [address]    National Suicide Prevention Lifeline:  504-877-2923    The following were reviewed with patient/family by Barton Dubois RN    1. Reason for IP admission  2. Major procedures and tests, including summary of results  3. Diagnosis at discharge  4. Current medication list  5. Were studies pending at discharge?     No                                         6. Patient instructions;  Adult Wellness, Recovery, and Safety Plan  7. Emergency contact information  8. Plan for follow up care   9. Name of provider(s), appointment(s), and location(s) of follow up care.                                              Advance Care Plan  10. Patient had a medical and mental health Advance Directive at admission. No: If patient did not have a medical and mental health Advance Directive at admission:  information about completing Advance Directives or designating a surrogate decision maker, and a form, was provided.                                                                                11. After receiving information- Did patient create a medical and mental health Advance Directive or appoint a surrogate decision maker?    No  Do not believe I will need a Mental Health Advance Directive

## 2019-05-28 NOTE — Plan of Care (Signed)
Patient presents with appropriate appearance and congruent affect . She is alert  and oriented x 4. She denies SI/HI and/or  A/V hallucinations. She is calm and interacting with peers and staff. She is compliant with medication and other treatments; her withdrawal level has been consistently decreasing. She quietly rested throughout the night.

## 2019-05-28 NOTE — Plan of Care (Signed)
Problem: Addiction to alcohol or opioids or other substances AS EVIDENCED BY:  Goal: Patient develops a discharge plan  Outcome: Completed   Nursing Discharge Note: Pt assessed for symptoms of alcohol withdrawal, CIWA 10, pt c/o anxiety. Pt given Serax 15 mg and scheduled medications this morning. She was discharged @ 11:15 AM, aftercare: [facility], 05/28/2019 12:00 PM, [address], [place], [place]  [address], [phone]. Pt denied any SI or HI. Pt's prescriptions for gabapentin 300 mg capsule and Trazodone 150 mg tablet were filled at the [facility] and given to the pt at discharge. Pt's belongings were returned to her. Pt was escorted to pick up area outside [facility] building by [facility] where she was picked up by a friend.

## 2019-05-28 NOTE — Discharge Summary -  Counselor (Signed)
[facility]   Discharge Summary    Patient Name: [name], [name]  Date of Birth: Dec 10, 1960  Medical Record Number: [id number]  Level of Care: Inpatient  Admission Date: 05/23/2019.  Unit Discharge Date: 05/28/2019   Reason For Discharge: Treatment Completed    Initial Justification for Treatment:   Azenet Donati is a 59 y.o. co-habitating Caucasian female presenting with diagnosis of alcohol use disorder;seere, alcohol withdrawal. This was patient's first treatment episode.    Detailed Drug Use History  Any current alcohol and/or drug use?: Yes  Reason for Alcohol or Drug Use: dependance, grief  Alcohol Use: In Lifetime             Age first used alcohol:  : 21            Max used in a day: 1/4 gallon of vodka            Pattern of use:: 1/2 gallon of vodka over 2 days            Last use:: 05/21/19            Longest Abstinence:  : 2 months  Tranquilizers/Benzodiazepines:: N/A  Tranquilizers/Benzodiazepines #2:: N/A  Tranquilizers/Benzodiazepines #3: N/A  Heroin Use:: N/A  Rx Opiates #1: : N/A  Rx Opiates #2: : N/A  Rx Opiates #3: : N/A  Opiates Use #4:: N/A  Opiates Use #5:: N/A  Opiates Use #6:: N/A  Non-Rx Methadone Use: N/A  Amphetamine Use:: N/A  Marijuana Use: N/A  Cocaine Use: In Lifetime            Age first used cocaine:: 30            Years of use:: 9 months            Max used in a day: unsure            Pattern of use:: dialy            Last use:: 1984            Longest Abstinence:  : years  Hallucinogens Use: N/A  Inhalants Use: N/A  PCP Use: N/A  Nicotine Use: N/A  Barbiturates Use: N/A  Other Substance Abuse: N/A  Other Substance Abuse #2:: N/A  Other Substance Abuse #3:: N/A  Caffeine use:: N/A.     Current Withdrawal Symptoms: Tremors, Anxiety  Past Withdrawal Symptoms: Anxiety, Irritability, Tremors, Sweats, Fatigue  History of Blackouts?: Yes  History of Withdrawal Seizures?: NoVictoria Ayleen Akard is a 60 y.o. co-habitating Caucasian female presenting with diagnosis of alcohol  use disorder;seere, alcohol withdrawal. This was patient's first treatment episode.    Detailed Drug Use History  Any current alcohol and/or drug use?: Yes  Reason for Alcohol or Drug Use: dependance, grief  Alcohol Use: In Lifetime             Age first used alcohol:  : 55            Max used in a day: 1/4 gallon of vodka            Pattern of use:: 1/2 gallon of vodka over 2 days            Last use:: 05/21/19            Longest Abstinence:  : 2 months  Tranquilizers/Benzodiazepines:: N/A  Tranquilizers/Benzodiazepines #2:: N/A  Tranquilizers/Benzodiazepines #3: N/A  Heroin Use:: N/A  Rx Opiates #1: : N/A  Rx Opiates #2: : N/A  Rx Opiates #3: : N/A  Opiates Use #4:: N/A  Opiates Use #5:: N/A  Opiates Use #6:: N/A  Non-Rx Methadone Use: N/A  Amphetamine Use:: N/A  Marijuana Use: N/A  Cocaine Use: In Lifetime            Age first used cocaine:: 30            Years of use:: 9 months            Max used in a day: unsure            Pattern of use:: dialy            Last use:: 1984            Longest Abstinence:  : years  Hallucinogens Use: N/A  Inhalants Use: N/A  PCP Use: N/A  Nicotine Use: N/A  Barbiturates Use: N/A  Other Substance Abuse: N/A  Other Substance Abuse #2:: N/A  Other Substance Abuse #3:: N/A  Caffeine use:: N/A.     Current Withdrawal Symptoms: Tremors, Anxiety  Past Withdrawal Symptoms: Anxiety, Irritability, Tremors, Sweats, Fatigue  History of Blackouts?: Yes  History of Withdrawal Seizures?: No    Summary of Significant Findings:  Upon discharge, patient does not report SI/HI.    Patient reports current living situation is single family home and that she lives with room mate in [place]. Patient reports she participates in gardening, reading and needle point for leisure activities. Patient describes current social relationships as "ended some though family and SO supportive". Patient denies legal history. Patient reports highest   . Patient is on Disability. Patient does not report  job/educational consequences as a result of her mental health/substance use. Patient does report financial problems currently.     Current Discharge Medication List           START taking these medications    Details   gabapentin (NEURONTIN) 300 MG capsule Take 1 capsule (300 mg total) by mouth every 8 (eight) hours  Qty: 45 capsule, Refills: 0      nicotine (NICODERM CQ) 21 MG/24HR Place 1 patch onto the skin daily  Qty: 14 patch, Refills: 0      traZODone (DESYREL) 150 MG tablet Take 0.5 tablets (75 mg total) by mouth nightly as needed for Sleep  Qty: 15 tablet, Refills: 0              CONTINUE these medications which have NOT CHANGED    Details   albuterol (PROVENTIL HFA;VENTOLIN HFA) 108 (90 Base) MCG/ACT inhaler Inhale 2 puffs into the lungs every 6 (six) hours as needed for Wheezing      Cholecalciferol (Vitamin D3) 1.25 MG (50000 UT) Cap Take 1 capsule by mouth once a week  Qty: 12 capsule, Refills: 0    Associated Diagnoses: Vitamin D deficiency      fluticasone (FLONASE) 50 MCG/ACT nasal spray 1 spray by Nasal route daily as needed (allergies)      folic acid (FOLVITE) 1 MG tablet Take 1 mg by mouth every morning      furosemide (LASIX) 40 MG tablet Take 1 tablet (40 mg total) by mouth daily as needed (swelling in feet or weight gain)  Qty: 30 tablet, Refills: 5      lisinopril (ZESTRIL) 5 MG tablet Take 5 mg by mouth every morning      Multiple Vitamins-Minerals (MULTIVITAMIN WITH MINERALS) tablet Take 1 tablet by mouth every morning.  pancrelipase, Lip-Prot-Amyl, (Creon) 24000-76000 units Cap DR Particles Take 72,000 units of lipase by mouth 3 (three) times daily with meals Take 3 capsules (72000 units of lipase) 3 times daily with meals      pantoprazole (PROTONIX) 40 MG tablet Take 40 mg by mouth every morning      sodium bicarbonate 650 MG tablet Take 1 tablet (650 mg total) by mouth 3 (three) times daily  Qty: 90 tablet, Refills: 5      spironolactone (ALDACTONE) 50 MG tablet  Take 50 mg by mouth every morning      thiamine (B-1) 100 MG tablet Take 100 mg by mouth every morning             STOP taking these medications       magnesium oxide (MAG-OX) 400 MG tablet        ondansetron (ZOFRAN-ODT) 4 MG disintegrating tablet         Summary of Services Provided:   Pt. was detoxified with Serax protocol. Additional medical therapy included Neurontin, Trazodone, and Nicoderm CQ. Pt. was individually assessed and treatment plans were developed. Pt. had been offered education on the disease process of addiction, relapse behaviors, self-defeating learned behaviors, defense mechanisms, and post acute withdrawal symptoms. Pt. was directed to attend all 12-step meetings and educational groups/lectures. Patient began inpatient level of care on 05/23/2019 10:04 PM. Patient was referred to this level of care by a friend.  Patient made the following progress towards meeting treatment goals: attended most scheduled Psychoeducational groups, attended most virtual AA meetings, presented Step One Task, and demonstrated marginal insight.  Patient's behavior in group was appropriate and supportive. Pt's aftercare plan is the [facility]. Patient presented in a contemplative stage of change as evidenced by verbally accepts recommendations though lacks follow through on actions toward complete abstinence.     Discharge Diagnosis:  Alcohol use disorder, severe  Alcohol withdrawal   Anxiety  GERD  Chronic Pancreatitis   Hypertension   History of DVT has vein filter  Hyperlipidemia    Condition at discharge  Pt was medically stabilized at time of discharge    Continuing Care Plan:   Continue attending 12-step meetings and obtain a sponsor  Continue to take all medications, as directed  Follow up with [facility]    Kizzie Fantasia, NCC, LPC-R

## 2019-06-04 ENCOUNTER — Other Ambulatory Visit (INDEPENDENT_AMBULATORY_CARE_PROVIDER_SITE_OTHER): Payer: Self-pay | Admitting: Medical

## 2019-06-04 ENCOUNTER — Ambulatory Visit (INDEPENDENT_AMBULATORY_CARE_PROVIDER_SITE_OTHER): Payer: Medicare Other | Admitting: Medical

## 2019-06-04 ENCOUNTER — Encounter (INDEPENDENT_AMBULATORY_CARE_PROVIDER_SITE_OTHER): Payer: Self-pay | Admitting: Medical

## 2019-06-04 ENCOUNTER — Ambulatory Visit
Admission: RE | Admit: 2019-06-04 | Discharge: 2019-06-04 | Disposition: A | Discharge status: Home / Self Care | Payer: Medicare Other | Source: Ambulatory Visit | Attending: Medical | Admitting: Medical

## 2019-06-04 DIAGNOSIS — S42402D Unspecified fracture of lower end of left humerus, subsequent encounter for fracture with routine healing: Secondary | ICD-10-CM

## 2019-06-04 DIAGNOSIS — M792 Neuralgia and neuritis, unspecified: Secondary | ICD-10-CM

## 2019-06-04 MED ORDER — GABAPENTIN 300 MG PO CAPS
300.00 mg | ORAL_CAPSULE | Freq: Three times a day (TID) | ORAL | 0 refills | Status: DC
Start: 2019-06-04 — End: 2019-06-10

## 2019-06-05 ENCOUNTER — Encounter (INDEPENDENT_AMBULATORY_CARE_PROVIDER_SITE_OTHER): Payer: Self-pay | Admitting: Medical

## 2019-06-05 NOTE — Progress Notes (Signed)
[facility]opa[facility]  Office Note    ASSESSMENT/PLAN   59 y.o. female with:    Closed left distal humerus fracture  S/p left elbow ORIF on 04/08/2019    2 months since DOS      Narcotic script provided: NO-gabapentin prescribed at patient's request that she reports she had good improvement in her symptoms from her previous prescription.  I advised her that if she needs another refill we can provide this but if she feels she is deriving benefit from the gabapentin past that, I would recommend she get refills from her primary care provider.   - Discussed use of RICE and/or Tylenol PRN  WB status: WBAT LUE  PT/OT script provided: NO  Work/SNF/school note provided: NO  Follow-up: in 6 week(s) for re-evaluation  Anticipated imaging: Surveillance    - Patient has been advised to follow-up sooner for persistent and/or worsening symptoms      *Reviewed imaging with Dr. Manson Passey who agrees with plan of care.  Patient's range of motion is impressive considering her prior trauma to her left elbow.  She does not wish to proceed with any physical therapy today.  Reviewed that she could begin some weightbearing as tolerated although I would not advise strenuous activities as the fracture line is still visible on x-ray.    CHIEF COMPLAINT     Chief Complaint   Patient presents with    Follow-up     DOI: 03/14/19 DOS: 04/08/19 RECK L DISTAL HUMERUS S/P ORIF HX: FALL DOWN STAIRS       HPI   59 y.o. female presents for:    Closed left distal humerus fracture  S/p left elbow ORIF on 04/08/2019    2 months since DOS    Patient reports that she is doing well overall, she has been working on range of motion of her elbow and is happy with her progress.  She does request refill of gabapentin as she has had some radicular type pain, she reports the gabapentin works well for this.  Unfortunately, she is a little late for her follow-up due to being admitted for alcohol detox.  Pain Score: 0-No pain    Denies numbness or tingling of  the distal extremity. No further complaints or concerns at this time.  PHYSICAL EXAM     Appearance:  Well developed, well nourished 58 y.o. female patient.     Psychiatric:   Mood and affect appropriate.   Eyes: Normal sclera, PERL.    HEENT:  Normocephalic, atraumatic head. Trachea midline.    Respiratory:  Normal respiratory effort without distress.    Cardiovascular: Extremity warm and well perfused. Brisk cap refill.    Integumentary: No open wounds noted. No visible rash noted.    Neurologic:  Sensation to light touch intact in distal extremity.    Musculoskeletal:  Left upper extremity -Well-healed incisions without sign of infection.  Chronic deformity of the left elbow is noted with a mild effusion.  No pain with range of motion of the elbow.  Elbow flexion 120, elbow extension 10.  Neurovascular intact distally.     RADIOLOGY     Plain films of the left elbow 3V were reviewed and interpreted by me.     Date Imaged:  06/04/2019      Date Reviewed:    06/04/2019     Location:  [facility]     Indication:  injury, pain       Interpretation: Hardware is present and appears to  be in good position without evidence of loosening. Interval healing is noted to fracture site, fracture line still remains visible.. Bony alignment appears stable compared to prior imaging.    ---    No results found.    DEMOGRAPHICS     Date and Time: 06/05/2019 10:48 AM  Patient Name: [name], [name]  DOB: [birth year]  Age: 59 y.o.   PCP: Penny Pia, MD    History provided by patient.     History limited by none.     REVIEW OF SYSTEMS/MEDICAL RECORD REVIEW     ROS as noted in HPI    ---    PAST MEDICAL AND SURGICAL HISTORY  Past Medical History:   Diagnosis Date    Asthma without status asthmaticus     Disorder of liver     multifocal cystic irregularities    DVT (deep venous thrombosis)     Gastroesophageal reflux disease     H/O ETOH abuse     sober 2 years until 03/2014    Hypertension     Pancreatitis      Past Surgical  History:   Procedure Laterality Date    CHOLECYSTECTOMY      COLONOSCOPY  12/26/2012    Procedure: COLONOSCOPY;  Surgeon: Gwenith Spitz, MD;  Location: [facility];  Service: Gastroenterology;  Laterality: N/A;    COLONOSCOPY, POLYPECTOMY  12/26/2012    Procedure: COLONOSCOPY, POLYPECTOMY;  Surgeon: Gwenith Spitz, MD;  Location: [facility];  Service: Gastroenterology;  Laterality: N/A;    DEBRIDEMENT & IRRIGATION, WOUND CLOSURE Right 08/29/2018    Procedure: WASHOUT AND CLOSURE OF TRAUMATIC WOUND, RIGHT FOREARM;  Surgeon: Arnoldo Hooker, MD;  Location: [facility];  Service: General;  Laterality: Right;  Right forearm I&D poss. wound closure    ORIF, HUMERUS, DISTAL Left 04/08/2019    Procedure: ORIF, HUMERUS, DISTAL;  Surgeon: Randolm Idol, MD;  Location: [facility];  Service: Orthopedics;  Laterality: Left;  ORIF LEFT DISTAL HEMERUS    PANCREATECTOMY  01/2011    partial    THORACENTESIS Right 2012    pl effusion       SOCIAL AND FAMILY HISTORY  Social History     Tobacco Use    Smoking status: Current Every Day Smoker     Packs/day: 0.50     Years: 30.00     Pack years: 15.00     Types: Cigarettes    Smokeless tobacco: Never Used    Tobacco comment: 1 pack every 3 days   Substance Use Topics    Alcohol use: Yes     Comment: 4-5 mixed drinks per day, 1/2 5th liqour    Drug use: No     Family History   Adopted: Yes       ALLERGIES    Allergies   Allergen Reactions    Norvasc [Amlodipine] Other (See Comments)     Unsteady on feet    Morphine Hallucinations    Oxycodone Headaches     severe       MEDICATIONS  Prior to Admission medications    Medication Sig Start Date End Date Taking? Authorizing Provider   albuterol (PROVENTIL HFA;VENTOLIN HFA) 108 (90 Base) MCG/ACT inhaler Inhale 2 puffs into the lungs every 6 (six) hours as needed for Wheezing    [provider]   Cholecalciferol (Vitamin D3) 1.25 MG (50000 UT) Cap Take 1 capsule by mouth once a week  Patient  taking differently: Take 1 capsule by  mouth Once each week on Monday    02/18/19   Agyeiwaah, Shelda Pal, MD   fluticasone (FLONASE) 50 MCG/ACT nasal spray 1 spray by Nasal route daily as needed (allergies)    [provider]   folic acid (FOLVITE) 1 MG tablet Take 1 mg by mouth every morning    [provider]   furosemide (LASIX) 40 MG tablet Take 1 tablet (40 mg total) by mouth daily as needed (swelling in feet or weight gain) 12/04/18   Agyeiwaah, Mavis A, MD   gabapentin (NEURONTIN) 300 MG capsule Take 1 capsule (300 mg total) by mouth every 8 (eight) hours 06/04/19 07/04/19  Lorn Junes B, PA   lisinopril (ZESTRIL) 5 MG tablet Take 5 mg by mouth every morning    [provider]   Multiple Vitamins-Minerals (MULTIVITAMIN WITH MINERALS) tablet Take 1 tablet by mouth every morning.       [provider]   nicotine (NICODERM CQ) 21 MG/24HR Place 1 patch onto the skin daily 05/28/19   Nicholaus Bloom, MD   pancrelipase, Lip-Prot-Amyl, (Creon) 24000-76000 units Cap DR Particles Take 72,000 units of lipase by mouth 3 (three) times daily with meals Take 3 capsules (72000 units of lipase) 3 times daily with meals    [provider]   pantoprazole (PROTONIX) 40 MG tablet Take 40 mg by mouth every morning    [provider]   sodium bicarbonate 650 MG tablet Take 1 tablet (650 mg total) by mouth 3 (three) times daily 03/18/19   Agyeiwaah, Gweneth Dimitri A, MD   spironolactone (ALDACTONE) 50 MG tablet Take 50 mg by mouth every morning    [provider]   thiamine (B-1) 100 MG tablet Take 100 mg by mouth every morning    [provider]   traZODone (DESYREL) 150 MG tablet Take 0.5 tablets (75 mg total) by mouth nightly as needed for Sleep 05/27/19   Nicholaus Bloom, MD       MDM/TIME SPENT     The time spent on the day of service in review of prior records and tests, face to face time with the patient, documenting, placing orders, independently analyzing  results, conferring with other clinicians, care coordination, and communicating results as outlined above was 15 minutes.            ---    This chart was generated with the use of an electronic medical record and may contain errors not intended by the author.    ---  Myrtie Neither  [facility]

## 2019-06-10 ENCOUNTER — Other Ambulatory Visit
Admission: RE | Admit: 2019-06-10 | Discharge: 2019-06-10 | Disposition: A | Discharge status: Home / Self Care | Payer: Medicare Other | Source: Ambulatory Visit | Attending: Family Medicine | Admitting: Family Medicine

## 2019-06-10 ENCOUNTER — Ambulatory Visit (INDEPENDENT_AMBULATORY_CARE_PROVIDER_SITE_OTHER): Payer: Medicare Other | Admitting: Family Medicine

## 2019-06-10 ENCOUNTER — Encounter (INDEPENDENT_AMBULATORY_CARE_PROVIDER_SITE_OTHER): Payer: Self-pay | Admitting: Family Medicine

## 2019-06-10 VITALS — BP 108/72 | HR 90 | Temp 96.7°F | Resp 16 | Wt 157.0 lb

## 2019-06-10 DIAGNOSIS — F17200 Nicotine dependence, unspecified, uncomplicated: Secondary | ICD-10-CM

## 2019-06-10 DIAGNOSIS — D696 Thrombocytopenia, unspecified: Secondary | ICD-10-CM

## 2019-06-10 DIAGNOSIS — F17210 Nicotine dependence, cigarettes, uncomplicated: Secondary | ICD-10-CM

## 2019-06-10 DIAGNOSIS — F1021 Alcohol dependence, in remission: Secondary | ICD-10-CM

## 2019-06-10 DIAGNOSIS — E559 Vitamin D deficiency, unspecified: Secondary | ICD-10-CM

## 2019-06-10 DIAGNOSIS — R748 Abnormal levels of other serum enzymes: Secondary | ICD-10-CM

## 2019-06-10 DIAGNOSIS — K860 Alcohol-induced chronic pancreatitis: Secondary | ICD-10-CM

## 2019-06-10 DIAGNOSIS — Z1231 Encounter for screening mammogram for malignant neoplasm of breast: Secondary | ICD-10-CM

## 2019-06-10 DIAGNOSIS — Z09 Encounter for follow-up examination after completed treatment for conditions other than malignant neoplasm: Secondary | ICD-10-CM

## 2019-06-10 DIAGNOSIS — D649 Anemia, unspecified: Secondary | ICD-10-CM

## 2019-06-10 DIAGNOSIS — J439 Emphysema, unspecified: Secondary | ICD-10-CM

## 2019-06-10 LAB — CBC AND DIFFERENTIAL
Basophils %: 0.7 % (ref 0.0–3.0)
Basophils Absolute: 0.1 10*3/uL (ref 0.0–0.3)
Eosinophils %: 1.4 % (ref 0.0–7.0)
Eosinophils Absolute: 0.1 10*3/uL (ref 0.0–0.8)
Hematocrit: 38.4 % (ref 36.0–48.0)
Hemoglobin: 11.6 gm/dL — ABNORMAL LOW (ref 12.0–16.0)
Lymphocytes Absolute: 2.2 10*3/uL (ref 0.6–5.1)
Lymphocytes: 27.6 % (ref 15.0–46.0)
MCH: 29 pg (ref 28–35)
MCHC: 30 gm/dL — ABNORMAL LOW (ref 32–36)
MCV: 97 fL (ref 80–100)
MPV: 7.4 fL (ref 6.0–10.0)
Monocytes Absolute: 0.6 10*3/uL (ref 0.1–1.7)
Monocytes: 7 % (ref 3.0–15.0)
Neutrophils %: 63.3 % (ref 42.0–78.0)
Neutrophils Absolute: 5.1 10*3/uL (ref 1.7–8.6)
PLT CT: 399 10*3/uL (ref 130–440)
RBC: 3.98 10*6/uL (ref 3.80–5.00)
RDW: 15.6 % — ABNORMAL HIGH (ref 11.0–14.0)
WBC: 8.1 10*3/uL (ref 4.0–11.0)

## 2019-06-10 LAB — COMPREHENSIVE METABOLIC PANEL
ALT: 27 U/L (ref 0–55)
AST (SGOT): 38 U/L (ref 10–42)
Albumin/Globulin Ratio: 1.02 Ratio (ref 0.80–2.00)
Albumin: 4.2 gm/dL (ref 3.5–5.0)
Alkaline Phosphatase: 194 U/L — ABNORMAL HIGH (ref 40–145)
Anion Gap: 14.7 mMol/L (ref 7.0–18.0)
BUN / Creatinine Ratio: 19.3 Ratio (ref 10.0–30.0)
BUN: 23 mg/dL — ABNORMAL HIGH (ref 7–22)
Bilirubin, Total: 0.5 mg/dL (ref 0.1–1.2)
CO2: 22 mMol/L (ref 20–30)
Calcium: 9.6 mg/dL (ref 8.5–10.5)
Chloride: 107 mMol/L (ref 98–110)
Creatinine: 1.19 mg/dL (ref 0.60–1.20)
EGFR: 50 mL/min/{1.73_m2} — ABNORMAL LOW (ref 60–150)
Globulin: 4.1 gm/dL — ABNORMAL HIGH (ref 2.0–4.0)
Glucose: 91 mg/dL (ref 71–99)
Osmolality Calculated: 281 mOsm/kg (ref 275–300)
Potassium: 4.7 mMol/L (ref 3.5–5.3)
Protein, Total: 8.3 gm/dL (ref 6.0–8.3)
Sodium: 139 mMol/L (ref 136–147)

## 2019-06-10 MED ORDER — GABAPENTIN 300 MG PO CAPS
300.00 mg | ORAL_CAPSULE | Freq: Three times a day (TID) | ORAL | 1 refills | Status: AC
Start: 2019-06-10 — End: 2019-07-10

## 2019-06-10 NOTE — Progress Notes (Signed)
[facility]  [address]  [place], [place], [address]  [phone]  06/10/2019     Patient:   [name]                                                  CSN:        [id number]                                          DOB:       05-22-1960                                                    MRN:        [id number]     Chief Complaint   Patient presents with    Follow-up     8 wk       SUBJECTIVE     History was provided by the Patient    HPI: Patient is a 59 y.o. female who presents today to follow-up on her recent hospitalizations.    Patient was admitted to [facility] from February 8 to February 9 because of chest pain.  She was found to be in alcohol withdrawal.  Her cardiac work-up including EKGs and nuclear stress test were low risk.  2D echocardiogram showed LVEF 60 to 65%, grade 1 diastolic dysfunction,  PA pressure moderately elevated at 50.      Patient was also at [facility] emergency room on February 12 for alcohol dependence.    Patient states that her roommate was a bad influence on her.  She states that after her recent hospital visit she started looking for places for inpatient detox. She found [facility] with [facility] and checked herself  inthere    Patient states that she is now living with a friend in [place] and has been sober for 3 weeks now.  Patient reports that her guy friend in [place] got rid of all alcohol in the house.    She reports that her guy friend makes sure that he drinks alcohol outside before he goes into the house    Patient was started on gabapentin 300 mg 3 times a day while in rehab and she requests a refill of this today  In terms of her left elbow problems patient states that she has full range of motion even though she has bony deformity from a previous elbow fracture.   Patient reports that x-rays done today shows that she still has fracture S/p left elbow ORIF on 04/08/2019    She has been doing on 90 AA meetings in order to help to support her sobriety.  Patient states that she is not ready to quit smoking cigarettes yet.  Pack of cigarettes lasts for 3 days    Patient had a CT chest done during her hospital visit which showed emphysema  Should make the appointment for a mammogram    She states that she had a colonoscopy in 2020 with 5 year f/u.  Pt's last colonoscopy in epic is from  12/26/2012 with f/u in 3-5 years      Patient states that her blood pressure has been stable her taking lisinopril 5 mg every other day.  She states that she checks her blood pressure in the morning before she takes it.  If her blood pressure is less than 140/90 she does not take the lisinopril.    Patient states that she has not needed to take Lasix.  She is compliant with taking spironolactone 50 mg a day    Patient states that she has an appointment coming up this week with [facility] gastroenterology for management of chronic liver and pancreatic problems    No LMP recorded. Patient is postmenopausal.         Past Medical History:   Diagnosis Date    Asthma without status asthmaticus     Disorder of liver     multifocal cystic irregularities    DVT (deep venous thrombosis)     Gastroesophageal reflux disease     H/O ETOH abuse     sober 2 years until 03/2014    Hypertension     Pancreatitis      Past Surgical History:   Procedure Laterality Date    CHOLECYSTECTOMY      COLONOSCOPY  12/26/2012    Procedure: COLONOSCOPY;  Surgeon: Gwenith Spitz, MD;  Location: [facility];  Service: Gastroenterology;  Laterality: N/A;    COLONOSCOPY, POLYPECTOMY  12/26/2012    Procedure: COLONOSCOPY, POLYPECTOMY;  Surgeon: Gwenith Spitz, MD;  Location: [facility];  Service: Gastroenterology;  Laterality: N/A;    DEBRIDEMENT & IRRIGATION, WOUND CLOSURE Right 08/29/2018    Procedure: WASHOUT AND  CLOSURE OF TRAUMATIC WOUND, RIGHT FOREARM;  Surgeon: Arnoldo Hooker, MD;  Location: [facility];  Service: General;  Laterality: Right;  Right forearm I&D poss. wound closure    ORIF, HUMERUS, DISTAL Left 04/08/2019    Procedure: ORIF, HUMERUS, DISTAL;  Surgeon: Randolm Idol, MD;  Location: [facility];  Service: Orthopedics;  Laterality: Left;  ORIF LEFT DISTAL HEMERUS    PANCREATECTOMY  01/2011    partial    THORACENTESIS Right 2012    pl effusion     Family History   Adopted: Yes     Social History     Social History Narrative    Not on file     Allergies   Allergen Reactions    Norvasc [Amlodipine] Other (See Comments)     Unsteady on feet    Morphine Hallucinations    Oxycodone Headaches     severe     Outpatient Medications Marked as Taking for the 06/10/19 encounter (Office Visit) with Penny Pia, MD   Medication Sig Dispense Refill    albuterol (PROVENTIL HFA;VENTOLIN HFA) 108 (90 Base) MCG/ACT inhaler Inhale 2 puffs into the lungs every 6 (six) hours as needed for Wheezing      Cholecalciferol (Vitamin D3) 1.25 MG (50000 UT) Cap Take 1 capsule by mouth once a week (Patient taking differently: Take 1 capsule by mouth Once each week on Monday   ) 12 capsule 0    fluticasone (FLONASE) 50 MCG/ACT nasal spray 1 spray by Nasal route daily as needed (allergies)      folic acid (FOLVITE) 1 MG tablet Take 1 mg by mouth every morning      gabapentin (NEURONTIN) 300 MG capsule Take 1 capsule (300 mg total) by mouth every 8 (eight) hours 90 capsule 1    lisinopril (ZESTRIL) 5 MG tablet  Take 5 mg by mouth every morning      Multiple Vitamins-Minerals (MULTIVITAMIN WITH MINERALS) tablet Take 1 tablet by mouth every morning.         pancrelipase, Lip-Prot-Amyl, (Creon) 24000-76000 units Cap DR Particles Take 72,000 units of lipase by mouth 3 (three) times daily with meals Take 3 capsules (72000 units of lipase) 3 times daily with meals      pantoprazole (PROTONIX) 40 MG tablet Take  40 mg by mouth every morning      sodium bicarbonate 650 MG tablet Take 1 tablet (650 mg total) by mouth 3 (three) times daily 90 tablet 5    spironolactone (ALDACTONE) 50 MG tablet Take 50 mg by mouth every morning      thiamine (B-1) 100 MG tablet Take 100 mg by mouth every morning      [DISCONTINUED] gabapentin (NEURONTIN) 300 MG capsule Take 1 capsule (300 mg total) by mouth every 8 (eight) hours 90 capsule 0       Review of Systems    Review of Systems: Items that patient complains of are in bold.  Items that the patient denies are not bolded.   General: Fever, Chills.   Eyes: Discharge  Ears/Nose/Throat: Earache, Congestion, Sore Throat.   Cardiovascular: Chest Pain, Palpitations, Peripheral Edema.   Respiratory: Cough, Dyspnea.   Gastrointestinal: Nausea, Vomiting, Diarrhea, Constipation, Abdominal Pain, Melena, Hematochezia.   Genitourinary:  Dysuria, Hematuria, Discharge or Incontinence.  Musculoskeletal:  Joint Pain.   Skin: Rash   Neurologic: Weakness.   Heme/Lymphatic: Abnormal Bruising.      PHYSICAL EXAM   BP 108/72 (BP Site: Right arm, Patient Position: Sitting, Cuff Size: Medium)    Pulse 90    Temp (!) 96.7 F (35.9 C) (Temporal)    Resp 16    Wt 71.2 kg (157 lb)    SpO2 98%    BMI 26.13 kg/m     Physical Exam    Constitutional: Well developed, well nourished, active, in no apparent distress, overweight  HENT:   Head: Normocephalic, atraumatic  Ears: No external lesions.  Nose: No external lesions. No epistaxis or drainage.  Eyes: PERRL. No scleral icterus. No conjunctival injection. EOMI.  Neck: Trachea is midline. No JVD. Normal range of motion. No apparent masses.  Cardiovascular: Regular rhythm, S1 normal and S2 normal.    No murmur heard.  Pulmonary/Chest: Effort normal. Lungs clear to auscultation bilaterally.   Abdominal: Soft, non-tender, non-distended. No masses.   Genitourinay/Anorectal: Defferred  Musculoskeletal: Normal range of motion. Gait normal. bony deformity is noted by left  elbow with some nontender swelling noted, no or apparent injury.   Neurological: Pt is alert. Cranial nerves are grossly intact. Moving all extremities without apparent deficit.   Psychiatric: Affect is appropriate. There is no agitation.   Skin: Skin is warm, dry, well perfused. No rash noted. No cyanosis. No pallor. No apparent wound.      LABS     Lab Results   Component Value Date    CHOL 216 (H) 05/25/2019    HDL 90 05/25/2019    TRIG 528 05/25/2019    NA 131 (L) 05/23/2019    K 3.8 05/25/2019    BUN 5.0 (L) 05/23/2019    GLU 114 (H) 05/23/2019    CA 9.4 05/23/2019    AST 30 05/23/2019    ALT 19 05/23/2019    ALKPHOS 156 (H) 05/23/2019    TSH 1.09 05/23/2019         UPDATED  IMMUNIZATIONS     Immunization History   Administered Date(s) Administered    Influenza quad 6 MOS to 64 YRS (Flulaval/Fluarix) 01/29/2019    Influenza quadrivalent (IM) PF 3 Yrs & greater 12/26/2017    Pneumococcal 23 valent 04/15/2019    Tdap 02/28/2018    Zoster [facility]) Vaccine Recombinant 02/07/2019         ASSESSMENT and PLAN     1. Hospital discharge follow-up     2. Alcohol dependence in early, early partial, sustained full, or sustained partial remission  gabapentin (NEURONTIN) 300 MG capsule   3. Chronic alcoholic pancreatitis     4. Elevated liver enzymes  Comprehensive metabolic panel   5. Anemia, unspecified type  CBC and differential   6. Thrombocytopenia  CBC and differential   7. Pulmonary emphysema, unspecified emphysema type     8. Vitamin D deficiency  Vitamin D,25 OH, Total   9. Breast cancer screening by mammogram     10. Tobacco use disorder           Will order labwork as noted for her chronic medical problems as above.  Previous Records reviewed:  Patient is congratulated on abstaining from alcohol use  Continue AA meetings to help you maintain your sobriety  We will continue gabapentin since it can help with cravings for alcohol  Patient is informed that she will need to sign a controlled substance agreement  and be subjected to random drug screens and pill counts if she is prescribed gabapentin chronically  Patient is advised to consider switching to an alternative blood pressure medications if the lisinopril continues to make her dizzy  Diet and exercise were discussed  The adverse side effects of smoking were discussed with patient.  Patient states that she will continue to work on cutting down on this  Patient is reminded to make an appointment for a mammogram  Follow up in 6 weeks for f/u of chronic problems, drug contract and UDS    Penny Pia, MD

## 2019-06-10 NOTE — Patient Instructions (Signed)
How to Quit Smoking  Smoking is a hard habit to break. About 50% of all people who have ever smoked have been able to quit. Most people who still smoke want to quit. Here are some of the best ways to stop smoking.     Keep in mind the health benefits of quitting  The health benefits of quitting start right away. They keep improving the longer you go without smoking. Knowing this can help inspire you to stay on track. These benefits occur at any age. If you are 17 or 70, quitting is a good choice. Some of the health benefits after your last cigarette include:   · After 20 minutes: Your blood pressure and pulse return to normal.  · After 8 hours: Your oxygen levels return to normal.  · After 2 days: Your ability to smell and taste start to improve as damaged nerves regrow.  · After 2 to 3 weeks: Your circulation and lung function improve.  · After 1 to 9 months: Your coughing, congestion, and shortness of breath decrease. Your tiredness decreases.  · After 1 year: Your risk of heart attack decreases by 50%.  · After 5 years: Your risk of lung cancer decreases by 50%. Your risk of stroke becomes the same as a nonsmoker’s.  Go cold turkey  Most former smokers quit cold turkey. This means stopping all at once. Trying to cut back slowly often doesn't work as well. This may be because it continues the habit of smoking. Also you may inhale more smoke while smoking fewer cigarettes. This leads to the same amount of nicotine in your body.   Get support  Support programs can be a big help, especially for heavy smokers. These groups offer lectures, ways to change behavior, and peer support. Here are some ways to find a support program:   · Free national quitline 800-QUIT-NOW (800-784-8669)  · Hospital quit-smoking programs  · American Lung Association 800-586-4872  · American Cancer Society 800-227-2345  Support at home is important too. Family and friends can offer praise and reassurance. If the smoker in your life finds it  hard to quit, encourage them to keep trying.   Try over-the-counter medicine  Nicotine replacement therapy may make it easier to quit. Some aids are available without a prescription. These include a nicotine patch, gum, and lozenges. But it's best to use these under the care of your healthcare provider. The skin patch gives a steady supply of nicotine. Nicotine gum and lozenges give short-time doses of low levels of nicotine. Both methods reduce the craving for cigarettes. If you have nausea, vomiting, dizziness, weakness, or a fast heartbeat, stop using these products. See your provider.   Ask about prescription medicine  After reviewing your smoking patterns and past attempts to quit, your healthcare provider may offer a prescription medicine such as bupropion, varenicline, a nicotine inhaler, or nasal spray. Each has advantages and side effects. Your provider can review these with you.   Keep trying  Most smokers make many attempts at quitting before they succeed. It’s important not to give up.   To learn more  For more on how to quit smoking, try these resources:   · www.cdc.gov/tobacco/quit_smoking/ 800-QUIT-NOW (800-784-8669)  · www.smokefree.gov 877-44U-QUIT (877-448-7848)  · www.lung.org/stop-smoking/ 800-LUNGUSA (800-586-4872)  [employer] last reviewed this educational content on 03/10/2018  © 2000-2020 [employer]. [address], [place] [address]. All rights reserved. This information is not intended as a substitute for professional medical care. Always follow your healthcare professional's instructions.

## 2019-06-11 LAB — VITAMIN D,25 OH,TOTAL: Vitamin D 25-Hydroxy: 92 ng/mL — ABNORMAL HIGH (ref 30–80)

## 2019-06-13 ENCOUNTER — Other Ambulatory Visit (INDEPENDENT_AMBULATORY_CARE_PROVIDER_SITE_OTHER): Payer: Self-pay | Admitting: Family Medicine

## 2019-06-13 DIAGNOSIS — D649 Anemia, unspecified: Secondary | ICD-10-CM

## 2019-06-13 MED ORDER — FERROUS SULFATE 325 (65 FE) MG PO TBEC
325.00 mg | DELAYED_RELEASE_TABLET | Freq: Two times a day (BID) | ORAL | 3 refills | Status: DC
Start: 2019-06-13 — End: 2019-08-01

## 2019-07-15 ENCOUNTER — Other Ambulatory Visit (INDEPENDENT_AMBULATORY_CARE_PROVIDER_SITE_OTHER): Payer: Self-pay | Admitting: Medical

## 2019-07-15 DIAGNOSIS — S42402D Unspecified fracture of lower end of left humerus, subsequent encounter for fracture with routine healing: Secondary | ICD-10-CM

## 2019-07-16 ENCOUNTER — Ambulatory Visit (INDEPENDENT_AMBULATORY_CARE_PROVIDER_SITE_OTHER): Payer: Medicare Other | Admitting: Medical

## 2019-07-16 ENCOUNTER — Ambulatory Visit
Admission: RE | Admit: 2019-07-16 | Discharge: 2019-07-16 | Disposition: A | Discharge status: Home / Self Care | Payer: Medicare Other | Source: Ambulatory Visit | Attending: Medical | Admitting: Medical

## 2019-07-16 ENCOUNTER — Encounter (INDEPENDENT_AMBULATORY_CARE_PROVIDER_SITE_OTHER): Payer: Self-pay | Admitting: Medical

## 2019-07-16 DIAGNOSIS — S42402D Unspecified fracture of lower end of left humerus, subsequent encounter for fracture with routine healing: Secondary | ICD-10-CM

## 2019-07-16 DIAGNOSIS — W109XXD Fall (on) (from) unspecified stairs and steps, subsequent encounter: Secondary | ICD-10-CM | POA: Insufficient documentation

## 2019-07-16 DIAGNOSIS — M19022 Primary osteoarthritis, left elbow: Secondary | ICD-10-CM | POA: Insufficient documentation

## 2019-07-16 DIAGNOSIS — S42412D Displaced simple supracondylar fracture without intercondylar fracture of left humerus, subsequent encounter for fracture with routine healing: Secondary | ICD-10-CM | POA: Insufficient documentation

## 2019-07-16 DIAGNOSIS — Z9889 Other specified postprocedural states: Secondary | ICD-10-CM | POA: Insufficient documentation

## 2019-07-16 DIAGNOSIS — M25822 Other specified joint disorders, left elbow: Secondary | ICD-10-CM | POA: Insufficient documentation

## 2019-07-16 DIAGNOSIS — R29898 Other symptoms and signs involving the musculoskeletal system: Secondary | ICD-10-CM

## 2019-07-16 NOTE — Progress Notes (Signed)
[facility]  Office Note    ASSESSMENT/PLAN   59 y.o. female with:    Closed left distal humerus fracture  S/p left elbow ORIF on 04/08/2019    3 months since DOS        Narcotic script provided: NO   - Discussed use of RICE and/or Tylenol PRN  WB status: WBAT  PT/OT script provided: YES- OT  Work/SNF/school note provided: NO  Follow-up: in 3 month(s) for re-evaluation  Anticipated imaging: Surveillance    - Patient has been advised to follow-up sooner for persistent and/or worsening symptoms      CHIEF COMPLAINT     Chief Complaint   Patient presents with    Follow-up     DOI: 03/14/19 DOS: 04/08/19 RECK L DISTAL HUMERUS S/P ORIF HX: FALL DOWN STAIRS       HPI   60 y.o. female presents for:    Closed left distal humerus fracture  S/p left elbow ORIF on 04/08/2019    3 months since DOS    Patient reports that she is doing okay, she reports that she has weakness in her left arm.  Pain Score: 0-No pain(C/O WEAKNESS)    Denies numbness or tingling of the distal extremity. No further complaints or concerns at this time.  PHYSICAL EXAM     Appearance:  Well developed, well nourished 59 y.o. female patient.     Psychiatric:   Mood and affect appropriate.   Eyes: Normal sclera, PERL.    HEENT:  Normocephalic, atraumatic head. Trachea midline.    Respiratory:  Normal respiratory effort without distress.    Cardiovascular: Extremity warm and well perfused. Brisk cap refill.    Integumentary: No open wounds noted. No visible rash noted.    Neurologic:  Sensation to light touch intact in distal extremity.    Musculoskeletal:  Left upper extremity -Well-healed incision without sign of infection.  Near full range of motion of the elbow.  4/5 strength with elbow flexion and extension.  Chronic deformity of the left elbow noted.  Neurovascular intact distally.     RADIOLOGY     Plain films of the left elbow 2 view were reviewed and interpreted by me.     Date Imaged:  07/16/2019      Date Reviewed:  07/16/2019      Location:  [facility]     Indication:  injury, pain       Interpretation: Hardware is present and appears to be in good position without evidence of loosening. Interval healing is noted to fracture site. Bony alignment appears stable compared to prior imaging.    ---    No results found.    DEMOGRAPHICS     Date and Time: 07/16/2019 11:16 AM  Patient Name: [name], [name]  DOB: 04-23-1960  Age: 59 y.o.   PCP: Penny Pia, MD    History provided by patient.     History limited by none.     REVIEW OF SYSTEMS/MEDICAL RECORD REVIEW     ROS as noted in HPI    ---    PAST MEDICAL AND SURGICAL HISTORY  Past Medical History:   Diagnosis Date    Asthma without status asthmaticus     Disorder of liver     multifocal cystic irregularities    DVT (deep venous thrombosis)     Gastroesophageal reflux disease     H/O ETOH abuse     sober 2 years until 03/2014    Hypertension  Pancreatitis      Past Surgical History:   Procedure Laterality Date    CHOLECYSTECTOMY      COLONOSCOPY  12/26/2012    Procedure: COLONOSCOPY;  Surgeon: Gwenith Spitz, MD;  Location: [facility];  Service: Gastroenterology;  Laterality: N/A;    COLONOSCOPY, POLYPECTOMY  12/26/2012    Procedure: COLONOSCOPY, POLYPECTOMY;  Surgeon: Gwenith Spitz, MD;  Location: [facility];  Service: Gastroenterology;  Laterality: N/A;    DEBRIDEMENT & IRRIGATION, WOUND CLOSURE Right 08/29/2018    Procedure: WASHOUT AND CLOSURE OF TRAUMATIC WOUND, RIGHT FOREARM;  Surgeon: Arnoldo Hooker, MD;  Location: [facility];  Service: General;  Laterality: Right;  Right forearm I&D poss. wound closure    ORIF, HUMERUS, DISTAL Left 04/08/2019    Procedure: ORIF, HUMERUS, DISTAL;  Surgeon: Randolm Idol, MD;  Location: [facility];  Service: Orthopedics;  Laterality: Left;  ORIF LEFT DISTAL HEMERUS    PANCREATECTOMY  01/2011    partial    THORACENTESIS Right 2012    pl effusion       SOCIAL AND FAMILY HISTORY  Social History     Tobacco  Use    Smoking status: Current Every Day Smoker     Packs/day: 0.50     Years: 30.00     Pack years: 15.00     Types: Cigarettes    Smokeless tobacco: Never Used    Tobacco comment: 1 pack every 3 days   Substance Use Topics    Alcohol use: Yes     Comment: 4-5 mixed drinks per day, 1/2 5th liqour    Drug use: No     Family History   Adopted: Yes       ALLERGIES    Allergies   Allergen Reactions    Norvasc [Amlodipine] Other (See Comments)     Unsteady on feet    Morphine Hallucinations    Oxycodone Headaches     severe       MEDICATIONS  Prior to Admission medications    Medication Sig Start Date End Date Taking? Authorizing Provider   albuterol (PROVENTIL HFA;VENTOLIN HFA) 108 (90 Base) MCG/ACT inhaler Inhale 2 puffs into the lungs every 6 (six) hours as needed for Wheezing    [provider]   Cholecalciferol (Vitamin D3) 1.25 MG (50000 UT) Cap Take 1 capsule by mouth once a week  Patient taking differently: Take 1 capsule by mouth Once each week on Monday    02/18/19   Agyeiwaah, Gweneth Dimitri A, MD   ferrous sulfate 325 (65 FE) MG EC tablet Take 1 tablet (325 mg total) by mouth 2 (two) times daily with meals 06/13/19   Agyeiwaah, Mavis A, MD   fluticasone (FLONASE) 50 MCG/ACT nasal spray 1 spray by Nasal route daily as needed (allergies)    [provider]   folic acid (FOLVITE) 1 MG tablet Take 1 mg by mouth every morning    [provider]   furosemide (LASIX) 40 MG tablet Take 1 tablet (40 mg total) by mouth daily as needed (swelling in feet or weight gain) 12/04/18   Agyeiwaah, Mavis A, MD   lisinopril (ZESTRIL) 5 MG tablet Take 5 mg by mouth every morning    [provider]   Multiple Vitamins-Minerals (MULTIVITAMIN WITH MINERALS) tablet Take 1 tablet by mouth every morning.       [provider]   pancrelipase, Lip-Prot-Amyl, (Creon) 24000-76000 units Cap DR Particles Take 72,000 units of lipase by mouth 3 (  three) times daily with meals Take 3 capsules (72000  units of lipase) 3 times daily with meals    [provider]   pantoprazole (PROTONIX) 40 MG tablet Take 40 mg by mouth every morning    [provider]   sodium bicarbonate 650 MG tablet Take 1 tablet (650 mg total) by mouth 3 (three) times daily 03/18/19   Agyeiwaah, Mavis A, MD   spironolactone (ALDACTONE) 50 MG tablet Take 50 mg by mouth every morning    [provider]   thiamine (B-1) 100 MG tablet Take 100 mg by mouth every morning    [provider]       MDM/TIME SPENT     The time spent on the day of service in review of prior records and tests, face to face time with the patient, documenting, placing orders, independently analyzing results, conferring with other clinicians, care coordination, and communicating results as outlined above was 21 minutes.            ---    This chart was generated with the use of an electronic medical record and may contain errors not intended by the author.    ---  Myrtie Neither  [facility]

## 2019-07-22 ENCOUNTER — Ambulatory Visit (INDEPENDENT_AMBULATORY_CARE_PROVIDER_SITE_OTHER): Payer: Medicare Other | Admitting: Family Medicine

## 2019-08-01 ENCOUNTER — Other Ambulatory Visit
Admission: RE | Admit: 2019-08-01 | Discharge: 2019-08-01 | Disposition: A | Discharge status: Home / Self Care | Payer: Medicare Other | Source: Ambulatory Visit | Attending: Family Medicine | Admitting: Family Medicine

## 2019-08-01 ENCOUNTER — Ambulatory Visit (INDEPENDENT_AMBULATORY_CARE_PROVIDER_SITE_OTHER): Payer: Medicare Other | Admitting: Family Medicine

## 2019-08-01 ENCOUNTER — Telehealth (INDEPENDENT_AMBULATORY_CARE_PROVIDER_SITE_OTHER): Payer: Self-pay | Admitting: Family Medicine

## 2019-08-01 ENCOUNTER — Encounter (INDEPENDENT_AMBULATORY_CARE_PROVIDER_SITE_OTHER): Payer: Self-pay | Admitting: Family Medicine

## 2019-08-01 VITALS — BP 110/80 | HR 82 | Resp 16 | Wt 157.0 lb

## 2019-08-01 DIAGNOSIS — I10 Essential (primary) hypertension: Secondary | ICD-10-CM

## 2019-08-01 DIAGNOSIS — F1021 Alcohol dependence, in remission: Secondary | ICD-10-CM

## 2019-08-01 DIAGNOSIS — Z1231 Encounter for screening mammogram for malignant neoplasm of breast: Secondary | ICD-10-CM

## 2019-08-01 DIAGNOSIS — D649 Anemia, unspecified: Secondary | ICD-10-CM

## 2019-08-01 DIAGNOSIS — S42402D Unspecified fracture of lower end of left humerus, subsequent encounter for fracture with routine healing: Secondary | ICD-10-CM

## 2019-08-01 DIAGNOSIS — J302 Other seasonal allergic rhinitis: Secondary | ICD-10-CM

## 2019-08-01 DIAGNOSIS — E559 Vitamin D deficiency, unspecified: Secondary | ICD-10-CM

## 2019-08-01 DIAGNOSIS — F17210 Nicotine dependence, cigarettes, uncomplicated: Secondary | ICD-10-CM

## 2019-08-01 DIAGNOSIS — R944 Abnormal results of kidney function studies: Secondary | ICD-10-CM

## 2019-08-01 LAB — COMPREHENSIVE METABOLIC PANEL
ALT: 18 U/L (ref 0–55)
AST (SGOT): 24 U/L (ref 10–42)
Albumin/Globulin Ratio: 1.26 Ratio (ref 0.80–2.00)
Albumin: 4.3 gm/dL (ref 3.5–5.0)
Alkaline Phosphatase: 157 U/L — ABNORMAL HIGH (ref 40–145)
Anion Gap: 14.6 mMol/L (ref 7.0–18.0)
BUN / Creatinine Ratio: 7.9 Ratio — ABNORMAL LOW (ref 10.0–30.0)
BUN: 8 mg/dL (ref 7–22)
Bilirubin, Total: 0.5 mg/dL (ref 0.1–1.2)
CO2: 20 mMol/L (ref 20–30)
Calcium: 10 mg/dL (ref 8.5–10.5)
Chloride: 109 mMol/L (ref 98–110)
Creatinine: 1.01 mg/dL (ref 0.60–1.20)
EGFR: 62 mL/min/{1.73_m2} (ref 60–150)
Globulin: 3.4 gm/dL (ref 2.0–4.0)
Glucose: 126 mg/dL — ABNORMAL HIGH (ref 71–99)
Osmolality Calculated: 279 mOsm/kg (ref 275–300)
Potassium: 3.6 mMol/L (ref 3.5–5.3)
Protein, Total: 7.7 gm/dL (ref 6.0–8.3)
Sodium: 140 mMol/L (ref 136–147)

## 2019-08-01 LAB — CBC AND DIFFERENTIAL
Basophils %: 0.7 % (ref 0.0–3.0)
Basophils Absolute: 0 10*3/uL (ref 0.0–0.3)
Eosinophils %: 0.7 % (ref 0.0–7.0)
Eosinophils Absolute: 0 10*3/uL (ref 0.0–0.8)
Hematocrit: 39.9 % (ref 36.0–48.0)
Hemoglobin: 12.6 gm/dL (ref 12.0–16.0)
Lymphocytes Absolute: 2.7 10*3/uL (ref 0.6–5.1)
Lymphocytes: 42.8 % (ref 15.0–46.0)
MCH: 31 pg (ref 28–35)
MCHC: 32 gm/dL (ref 32–36)
MCV: 98 fL (ref 80–100)
MPV: 8.3 fL (ref 6.0–10.0)
Monocytes Absolute: 0.4 10*3/uL (ref 0.1–1.7)
Monocytes: 6.6 % (ref 3.0–15.0)
Neutrophils %: 49.3 % (ref 42.0–78.0)
Neutrophils Absolute: 3.1 10*3/uL (ref 1.7–8.6)
PLT CT: 236 10*3/uL (ref 130–440)
RBC: 4.1 10*6/uL (ref 3.80–5.00)
RDW: 18.5 % — ABNORMAL HIGH (ref 11.0–14.0)
WBC: 6.2 10*3/uL (ref 4.0–11.0)

## 2019-08-01 MED ORDER — VITAMIN D3 1.25 MG (50000 UT) PO CAPS
1.00 | ORAL_CAPSULE | ORAL | 1 refills | Status: DC
Start: 2019-08-01 — End: 2020-05-31

## 2019-08-01 MED ORDER — FOLIC ACID 1 MG PO TABS
1.0000 mg | ORAL_TABLET | Freq: Every morning | ORAL | 3 refills | Status: DC
Start: 2019-08-01 — End: 2021-02-09

## 2019-08-01 MED ORDER — FERROUS SULFATE 325 (65 FE) MG PO TBEC
325.00 mg | DELAYED_RELEASE_TABLET | Freq: Two times a day (BID) | ORAL | 3 refills | Status: DC
Start: 2019-08-01 — End: 2020-01-06

## 2019-08-01 MED ORDER — MAGNESIUM OXIDE 400 MG PO TABS
800.0000 mg | ORAL_TABLET | Freq: Three times a day (TID) | ORAL | 1 refills | Status: AC
Start: 2019-08-01 — End: 2019-09-30

## 2019-08-01 MED ORDER — CETIRIZINE HCL 10 MG PO TABS
10.0000 mg | ORAL_TABLET | Freq: Every day | ORAL | 3 refills | Status: DC
Start: 2019-08-01 — End: 2020-07-21

## 2019-08-01 MED ORDER — THIAMINE HCL 100 MG PO TABS
100.0000 mg | ORAL_TABLET | Freq: Every morning | ORAL | 3 refills | Status: DC
Start: 2019-08-01 — End: 2020-01-19

## 2019-08-01 NOTE — Telephone Encounter (Signed)
Please fax the magnesium lab order to the lab at  [phone] so they can include it with the labs drawn today

## 2019-08-01 NOTE — Telephone Encounter (Signed)
Done. Order faxed to lab to add.    LS

## 2019-08-01 NOTE — Patient Instructions (Signed)
Elbow Fracture    You have a break (fracture) of one or more bones of your elbow joint. This may be a small crack in the bone. Or it may be a major break, with the broken parts pushed out of position.   This fracture usually takes 4 to 12 weeks to heal, depending on the type. The first step in treatment is with a splint or cast. Severe fractures may need surgery to put the bone fragments back into place. This is done by an orthopedic surgeon. This is a surgeon who specializes in treating bone, muscle, joint, and tendon problems.   Home care  Follow these guidelines when caring for yourself at home:   Keep your arm elevated to reduce pain and swelling. When sitting or lying down keep your arm above the level of your heart. You can do this by placing your arm on a pillow that rests on your chest or on a pillow at your side. This is most important during the first 2 days (48 hours) after the injury.   Put an ice pack on the injured area. Do this for 20 minutes every 1 to 2 hours the first day. You can make an ice pack by wrapping a plastic bag of ice cubes in a thin towel. As the ice melts, be careful that the cast or splint doesn't get wet. You can place the ice pack inside the sling and directly over the splint or cast. Continue to use the ice pack 3 to 4 times a day for the next 2 days. Then use the ice pack as needed to ease pain and swelling.   Keep the splint or cast completely dry at all times. Bathe with your splint or cast out of the water. Protect it with a large plastic bag, rubber-banded or taped at the top end. If a fiberglass splint or cast gets wet, you can dry it with a hair dryer on a cool setting.   You may use acetaminophen or ibuprofen to control pain, unless another pain medicine was prescribed. If you have chronic liver or kidney disease, talk with your healthcare provider before using these medicines. Also talk with your provider if you've had a stomach ulcer or gastrointestinal bleeding.    Don't put creams or objects under the cast if you have itching.  Follow-up care  Follow up with your healthcare provider in 1 week, or as advised. This is to make sure the bone is healing the way it should. If a splint was put on, it may be changed to a cast during your follow-up visit.   X-rays may be taken. You will be told of any new findings that may affect your care.  When to seek medical advice  Call your healthcare provider right away if any of these occur:   The cast or splint cracks   The plaster cast or splint becomes wet or soft   The fiberglass cast or splint stays wet for more than 24 hours   Tightness or pain under the cast or splint gets worse   Bad odor from the cast or wound fluid stains the cast   Fingers become swollen, cold, blue, numb, or tingly   You can't move your fingers   Skin around cast becomes red   Fever of 100.4F (38C) or higher, or as directed by your healthcare provider   [employer] last reviewed this educational content on 12/09/2017   2000-2020 [employer] [address], [place] [address]. All rights reserved. This information is not intended as a substitute for professional medical care. Always follow your healthcare professional's instructions.

## 2019-08-01 NOTE — Addendum Note (Signed)
Addended byBosie Clos, Sem Mccaughey A on: 08/01/2019 10:16 AM     Modules accepted: Orders

## 2019-08-01 NOTE — Progress Notes (Signed)
[facility]  [address]  [place], [place], [address]  [phone]  08/01/2019     Patient:   [name]                                                  CSN:        [id number]                                          DOB:       June 24, 1960                                                    MRN:        [id number]     Chief Complaint   Patient presents with    Follow-up     6 week f/u      SUBJECTIVE     History was provided by the Patient    HPI: Patient is a 59 y.o. female who presents today for follow-up  Patient states that she has been doing well since her last visit    She is very happy that she is 3 months sober.  Patient states that living in [place] with her friend who is supportive of her sobriety has been helpful   She states that she did talk to her previous roommate who is  an alcoholic a couple times due to needing  him to sign some papers  since they are on the lease together locally  However she has not had any cravings to drink    Patient states that she takes the gabapentin once in a while for her left elbow pain.  She is trying to avoid taking it and eventually wants to stop taking it.    Patient states that she followed up with orthopedics about her left elbow problems and she was told that her left elbow fracture have not healed completely but is still healing.  She will follow up with orthopedics in 1 month.    Orthopedics wants her to start doing physical therapy to help with her symptoms.    She still has not quit smoking cigarettes because she is to focus on maintaining her sobriety from alcohol.   Patient states that she has cut back a lot with time  Patient states that she ran out of her magnesium supplements about a week ago and she wants to know whether she still needs to take the supplements.   She is happy that her  liver enzymes are back to normal.  Patient states that she has not followed up with [facility] since she is not having any abdominal problems.  Also [facility] very far from [place].  So she will prefer to follow-up with [facility] in the future if needed  Patient states that she has been taking her vitamin D supplementation every 2 weeks has been told that her vitamin D levels were higher normal.  She needs a refill of the vitamin D supplements  She can only tolerate taking iron supplements once a  day since it causes an alternation of diarrhea and constipation  Patient's recent labs showed mild anemia.  She denies any bleeding or melena  In terms of hypertension patient states that she stopped taking lisinopril 5 mg after her last visit due to it causing dizziness.  She is compliant with taking spironolactone daily and takes Lasix once in a while for lower extremity edema.  Pt recent GFR was reduced at 50 on her last labs  She states that her home blood pressure readings are persistently under 140/90  She denies chest pain, shortness of breath, dizziness or headache  Patient states that she is aware that she needs to get the mammogram done.  She will take care of this soon  No LMP recorded. Patient is postmenopausal.      Past Medical History:   Diagnosis Date    Asthma without status asthmaticus     Disorder of liver     multifocal cystic irregularities    DVT (deep venous thrombosis)     Gastroesophageal reflux disease     H/O ETOH abuse     sober 2 years until 03/2014    Hypertension     Pancreatitis      Past Surgical History:   Procedure Laterality Date    CHOLECYSTECTOMY      COLONOSCOPY  12/26/2012    Procedure: COLONOSCOPY;  Surgeon: Gwenith Spitz, MD;  Location: [facility];  Service: Gastroenterology;  Laterality: N/A;    COLONOSCOPY, POLYPECTOMY  12/26/2012    Procedure: COLONOSCOPY, POLYPECTOMY;  Surgeon: Gwenith Spitz, MD;  Location: [facility];  Service:  Gastroenterology;  Laterality: N/A;    DEBRIDEMENT & IRRIGATION, WOUND CLOSURE Right 08/29/2018    Procedure: WASHOUT AND CLOSURE OF TRAUMATIC WOUND, RIGHT FOREARM;  Surgeon: Arnoldo Hooker, MD;  Location: [facility];  Service: General;  Laterality: Right;  Right forearm I&D poss. wound closure    ORIF, HUMERUS, DISTAL Left 04/08/2019    Procedure: ORIF, HUMERUS, DISTAL;  Surgeon: Randolm Idol, MD;  Location: [facility];  Service: Orthopedics;  Laterality: Left;  ORIF LEFT DISTAL HEMERUS    PANCREATECTOMY  01/2011    partial    THORACENTESIS Right 2012    pl effusion     Family History   Adopted: Yes     Social History     Social History Narrative    Not on file     Allergies   Allergen Reactions    Norvasc [Amlodipine] Other (See Comments)     Unsteady on feet    Morphine Hallucinations    Oxycodone Headaches     severe     Outpatient Medications Marked as Taking for the 08/01/19 encounter (Office Visit) with Penny Pia, MD   Medication Sig Dispense Refill    albuterol (PROVENTIL HFA;VENTOLIN HFA) 108 (90 Base) MCG/ACT inhaler Inhale 2 puffs into the lungs every 6 (six) hours as needed for Wheezing      Cholecalciferol (Vitamin D3) 1.25 MG (50000 UT) Cap Take 1 capsule by mouth every 14 (fourteen) days 12 capsule 1    ferrous sulfate 325 (65 FE) MG EC tablet Take 1 tablet (325 mg total) by mouth 2 (two) times daily with meals 60 tablet 3    fluticasone (FLONASE) 50 MCG/ACT nasal spray 1 spray by Nasal route daily as needed (allergies)      folic acid (FOLVITE) 1 MG tablet Take 1 tablet (1 mg total) by mouth every morning 90 tablet 3  furosemide (LASIX) 40 MG tablet Take 1 tablet (40 mg total) by mouth daily as needed (swelling in feet or weight gain) 30 tablet 5    Multiple Vitamins-Minerals (MULTIVITAMIN WITH MINERALS) tablet Take 1 tablet by mouth every morning.         pancrelipase, Lip-Prot-Amyl, (Creon) 24000-76000 units Cap DR Particles Take 72,000 units of lipase  by mouth 3 (three) times daily with meals Take 3 capsules (72000 units of lipase) 3 times daily with meals      pantoprazole (PROTONIX) 40 MG tablet Take 40 mg by mouth every morning      sodium bicarbonate 650 MG tablet Take 1 tablet (650 mg total) by mouth 3 (three) times daily 90 tablet 5    spironolactone (ALDACTONE) 50 MG tablet Take 50 mg by mouth every morning      thiamine (B-1) 100 MG tablet Take 1 tablet (100 mg total) by mouth every morning 90 tablet 3    [DISCONTINUED] Cholecalciferol (Vitamin D3) 1.25 MG (50000 UT) Cap Take 1 capsule by mouth once a week (Patient taking differently: Take 1 capsule by mouth Once each week on Monday   ) 12 capsule 0    [DISCONTINUED] ferrous sulfate 325 (65 FE) MG EC tablet Take 1 tablet (325 mg total) by mouth 2 (two) times daily with meals 60 tablet 3    [DISCONTINUED] folic acid (FOLVITE) 1 MG tablet Take 1 mg by mouth every morning      [DISCONTINUED] lisinopril (ZESTRIL) 5 MG tablet Take 5 mg by mouth every morning      [DISCONTINUED] thiamine (B-1) 100 MG tablet Take 100 mg by mouth every morning         Review of Systems    Review of Systems: Items that patient complains of are in bold.  Items that the patient denies are not bolded.   General: Fever, Chills.   Eyes: Discharge  Ears/Nose/Throat: Earache, Congestion due to allergies which is not improving with use of Flonase Sore Throat.   Cardiovascular: Chest Pain, Palpitations, Peripheral Edema.   Respiratory: Cough, Dyspnea.   Gastrointestinal: Nausea, Vomiting, Diarrhea, Constipation, Abdominal Pain, Melena, Hematochezia.   Genitourinary:  Dysuria, Hematuria, Discharge or Incontinence.  Musculoskeletal:  Joint Pain.   Skin: Rash   Neurologic: Weakness.   Heme/Lymphatic: Abnormal Bruising.      PHYSICAL EXAM     Vitals:    08/01/19 1007 08/01/19 1020   BP: (!) 132/94 110/80   BP Site: Left arm    Patient Position: Sitting    Pulse: 82    Resp: 16    SpO2: 95%    Weight: 71.2 kg (157 lb)      Physical  Exam    Constitutional: Well developed, well nourished, active, in no apparent distress, overweight  HENT:   Head: Normocephalic, atraumatic  Ears: No external lesions.  Nose: No external lesions. No epistaxis or drainage.  Eyes: PERRL. No scleral icterus. No conjunctival injection. EOMI.  Neck: Trachea is midline. No JVD. Normal range of motion. No apparent masses.  Cardiovascular: Regular rhythm, S1 normal and S2 normal.    No murmur heard.  Pulmonary/Chest: Effort normal. Lungs clear to auscultation bilaterally.   Abdominal: Soft, non-tender, non-distended. No masses.   Genitourinay/Anorectal: Defferred  Musculoskeletal: Normal range of motion. Gait normal. bony deformity is noted by left elbow with some nontender swelling noted, no or apparent injury.   Neurological: Pt is alert. Cranial nerves are grossly intact. Moving all extremities without apparent deficit.  Psychiatric: Affect is appropriate. There is no agitation.   Skin: Skin is warm, dry, well perfused. No rash noted. No cyanosis. No pallor. No apparent wound.      LABS     Lab Results   Component Value Date    CHOL 216 (H) 05/25/2019    HDL 90 05/25/2019    TRIG 161 05/25/2019    NA 139 06/10/2019    K 4.7 06/10/2019    BUN 23 (H) 06/10/2019    GLU 91 06/10/2019    CA 9.6 06/10/2019    AST 38 06/10/2019    ALT 27 06/10/2019    ALKPHOS 194 (H) 06/10/2019    TSH 1.09 05/23/2019         UPDATED IMMUNIZATIONS     Immunization History   Administered Date(s) Administered    Influenza quad 6 MOS to 64 YRS (Flulaval/Fluarix) 01/29/2019    Influenza quadrivalent (IM) PF 3 Yrs & greater 12/26/2017    Pneumococcal 23 valent 04/15/2019    Tdap 02/28/2018    Zoster [facility]) Vaccine Recombinant 02/07/2019         ASSESSMENT and PLAN     1. Alcohol dependence in early, early partial, sustained full, or sustained partial remission  folic acid (FOLVITE) 1 MG tablet    thiamine (B-1) 100 MG tablet    CBC and differential    Comprehensive metabolic panel   2.  Closed fracture dislocation of left elbow with routine healing, subsequent encounter  CBC and differential    Comprehensive metabolic panel   3. Hypertension, essential  CBC and differential    Comprehensive metabolic panel   4. Decreased GFR  CBC and differential    Comprehensive metabolic panel   5. Seasonal allergies  CBC and differential    cetirizine (ZyrTEC) 10 MG tablet    Comprehensive metabolic panel   6. Anemia, unspecified type  ferrous sulfate 325 (65 FE) MG EC tablet    CBC and differential    Comprehensive metabolic panel   7. Vitamin D deficiency  Cholecalciferol (Vitamin D3) 1.25 MG (50000 UT) Cap    CBC and differential    Comprehensive metabolic panel   8. Hypomagnesemia  Magnesium    magnesium oxide (MAG-OX) 400 MG tablet   9. Breast cancer screening by mammogram     10. Nicotine dependence, cigarettes, uncomplicated           Will order labwork as noted for her chronic medical problems as above.  Previous Records reviewed:  Patient is congratulated on abstaining from alcohol use  Patient is advised that her decreased GFR is likely due to her diuretic use  I will stop Lisinopril since patient blood pressure is normal today and she states that she cannot tolerate it  She is advised to increase her water intake  The adverse side effects of smoking were discussed with patient.  Patient states that she will continue to work on cutting down on this  I counseled patient about tobacco cessation for 3 minutes  Patient is reminded to make an appointment for a mammogram  Follow up  In 3 months and PRN    Penny Pia, MD

## 2019-08-02 ENCOUNTER — Other Ambulatory Visit
Admission: RE | Admit: 2019-08-02 | Discharge: 2019-08-02 | Disposition: A | Discharge status: Home / Self Care | Payer: Medicare Other | Source: Ambulatory Visit | Attending: Family Medicine | Admitting: Family Medicine

## 2019-08-02 LAB — MAGNESIUM: Magnesium: 1.7 mg/dL (ref 1.6–2.6)

## 2019-08-13 ENCOUNTER — Encounter (INDEPENDENT_AMBULATORY_CARE_PROVIDER_SITE_OTHER): Payer: Self-pay

## 2019-08-23 NOTE — Progress Notes (Signed)
Pt has been seen since this appt ([place])

## 2019-10-14 ENCOUNTER — Other Ambulatory Visit (INDEPENDENT_AMBULATORY_CARE_PROVIDER_SITE_OTHER): Payer: Self-pay | Admitting: Medical

## 2019-10-14 DIAGNOSIS — S42402D Unspecified fracture of lower end of left humerus, subsequent encounter for fracture with routine healing: Secondary | ICD-10-CM

## 2019-10-14 NOTE — Progress Notes (Deleted)
[facility] Orthopaedic Trauma  Office Note    ASSESSMENT/PLAN   59 y.o. female with:    Closed left distal humerus fracture  S/p left elbow ORIF on 04/08/2019    6 months since DOS        No orders of the defined types were placed in this encounter.         - Discussed use of RICE and/or OTC medications PRN  WB status: {weightbearingstatus:46035}      - Patient placed in ***.   PT/OT script provided: {YES + 2:45294}  Work/SNF/school note provided: {YES + 2:45294}  Follow-up: {follow up:15908} for re-evaluation  Anticipated imaging: {anticipateimaging:46034}   - Patient has been advised to follow-up sooner for persistent and/or worsening symptoms      CHIEF COMPLAINT     No chief complaint on file.      HPI   59 y.o. female presents for:    Closed left distal humerus fracture  S/p left elbow ORIF on 04/08/2019    6 months since DOS    Patient reports that ***       Denies numbness or tingling of the distal extremity. No further complaints or concerns at this time.  PHYSICAL EXAM     Appearance:  Well developed, well nourished 60 y.o. female patient.     Psychiatric:   Mood and affect appropriate.   Eyes: Normal sclera, PERL.    HEENT:  Normocephalic, atraumatic head. Trachea midline.    Respiratory:  Normal respiratory effort without distress.    Cardiovascular: Extremity warm and well perfused. Brisk cap refill.    Integumentary: No open wounds noted. No visible rash noted.    Neurologic:  Sensation to light touch intact in distal extremity.    Musculoskeletal:  {EXTREMITY LOCATION:45272}- ***     RADIOLOGY     {IMAGING STUDY:45286} of the {LEFT/RIGHT:42183} {X-ray location ortho:45647} {views:45648} were reviewed and interpreted by me.     Date Imaged:  10/14/2019 ***     Date Reviewed:  10/14/2019     Location:  [facility]     Indication:  injury, pain       Interpretation: Hardware is present and appears to be in good position without evidence of loosening. Interval healing is noted to ***. Bony alignment appears stable  compared to prior imaging.    ---    No results found.    DEMOGRAPHICS     Date and Time: 10/14/2019 8:43 AM  Patient Name: [name], [name]  DOB: 08-07-1960  Age: 59 y.o.   PCP: Penny Pia, MD    History provided by patient. ***    History limited by none.     REVIEW OF SYSTEMS/MEDICAL RECORD REVIEW     ROS as noted in HPI    ---    PAST MEDICAL AND SURGICAL HISTORY  Past Medical History:   Diagnosis Date    Asthma without status asthmaticus     Disorder of liver     multifocal cystic irregularities    DVT (deep venous thrombosis)     Gastroesophageal reflux disease     H/O ETOH abuse     sober 2 years until 03/2014    Hypertension     Pancreatitis      Past Surgical History:   Procedure Laterality Date    CHOLECYSTECTOMY      COLONOSCOPY  12/26/2012    Procedure: COLONOSCOPY;  Surgeon: Gwenith Spitz, MD;  Location: [facility];  Service: Gastroenterology;  Laterality:  N/A;    COLONOSCOPY, POLYPECTOMY  12/26/2012    Procedure: COLONOSCOPY, POLYPECTOMY;  Surgeon: Gwenith Spitz, MD;  Location: [facility];  Service: Gastroenterology;  Laterality: N/A;    DEBRIDEMENT & IRRIGATION, WOUND CLOSURE Right 08/29/2018    Procedure: WASHOUT AND CLOSURE OF TRAUMATIC WOUND, RIGHT FOREARM;  Surgeon: Arnoldo Hooker, MD;  Location: [facility];  Service: General;  Laterality: Right;  Right forearm I&D poss. wound closure    ORIF, HUMERUS, DISTAL Left 04/08/2019    Procedure: ORIF, HUMERUS, DISTAL;  Surgeon: Randolm Idol, MD;  Location: [facility];  Service: Orthopedics;  Laterality: Left;  ORIF LEFT DISTAL HEMERUS    PANCREATECTOMY  01/2011    partial    THORACENTESIS Right 2012    pl effusion       SOCIAL AND FAMILY HISTORY  Social History     Tobacco Use    Smoking status: Current Every Day Smoker     Packs/day: 0.50     Years: 30.00     Pack years: 15.00     Types: Cigarettes    Smokeless tobacco: Never Used    Tobacco comment: 1 pack every 3 days   Vaping Use     Vaping Use: Never used   Substance Use Topics    Alcohol use: Yes     Comment: 4-5 mixed drinks per day, 1/2 5th liqour    Drug use: No     Family History   Adopted: Yes       ALLERGIES  Allergies   Allergen Reactions    Norvasc [Amlodipine] Other (See Comments)     Unsteady on feet    Morphine Hallucinations    Oxycodone Headaches     severe       MEDICATIONS  Prior to Admission medications    Medication Sig Start Date End Date Taking? Authorizing Provider   albuterol (PROVENTIL HFA;VENTOLIN HFA) 108 (90 Base) MCG/ACT inhaler Inhale 2 puffs into the lungs every 6 (six) hours as needed for Wheezing    [provider]   cetirizine (ZyrTEC) 10 MG tablet Take 1 tablet (10 mg total) by mouth daily 08/01/19   Agyeiwaah, Mavis A, MD   Cholecalciferol (Vitamin D3) 1.25 MG (50000 UT) Cap Take 1 capsule by mouth every 14 (fourteen) days 08/01/19   Wayne Sever A, MD   ferrous sulfate 325 (65 FE) MG EC tablet Take 1 tablet (325 mg total) by mouth 2 (two) times daily with meals 08/01/19   Agyeiwaah, Mavis A, MD   fluticasone (FLONASE) 50 MCG/ACT nasal spray 1 spray by Nasal route daily as needed (allergies)    [provider]   folic acid (FOLVITE) 1 MG tablet Take 1 tablet (1 mg total) by mouth every morning 08/01/19   Agyeiwaah, Mavis A, MD   furosemide (LASIX) 40 MG tablet Take 1 tablet (40 mg total) by mouth daily as needed (swelling in feet or weight gain) 12/04/18   Agyeiwaah, Mavis A, MD   Multiple Vitamins-Minerals (MULTIVITAMIN WITH MINERALS) tablet Take 1 tablet by mouth every morning.       [provider]   pancrelipase, Lip-Prot-Amyl, (Creon) 24000-76000 units Cap DR Particles Take 72,000 units of lipase by mouth 3 (three) times daily with meals Take 3 capsules (72000 units of lipase) 3 times daily with meals    [provider]   pantoprazole (PROTONIX) 40 MG tablet Take 40 mg by mouth every morning    [provider]   sodium  bicarbonate 650 MG tablet Take 1 tablet  (650 mg total) by mouth 3 (three) times daily 03/18/19   Agyeiwaah, Gweneth Dimitri A, MD   spironolactone (ALDACTONE) 50 MG tablet Take 50 mg by mouth every morning    [provider]   thiamine (B-1) 100 MG tablet Take 1 tablet (100 mg total) by mouth every morning 08/01/19   Penny Pia, MD       MDM/TIME SPENT     The time spent on the day of service in review of prior records and tests, face to face time with the patient, documenting, placing orders, independently analyzing results, conferring with other clinicians, care coordination, and communicating results as outlined above was *** minutes.    {Optional Reference Only-Table will not populate note upon signing.  NEW  EST    99202 15-29 min 16109 10-19 min   99203 30-44 min 60454 20-29 min   99204 45-59 min 09811 30-39 min   99205 60-74 min 91478 40-49 min     :39607}    Medical Decision Making:  {Problems Addressed:50374::"Number and Complexity of Problems Addressed:"}    {Amount and/or Complexity:50375::"Amount and/or Complexity of Data Reviewed:"}    {Risk:50376::"Risk of Complications and/or Morbidity or Mortality of Patient Management:"}    ---    This chart was generated with the use of an electronic medical record and may contain errors not intended by the author.    ---  Myrtie Neither  [facility] Orthopaedic Trauma

## 2019-10-15 ENCOUNTER — Encounter (INDEPENDENT_AMBULATORY_CARE_PROVIDER_SITE_OTHER): Payer: Self-pay

## 2019-10-15 ENCOUNTER — Ambulatory Visit (INDEPENDENT_AMBULATORY_CARE_PROVIDER_SITE_OTHER): Payer: Medicare Other

## 2019-10-31 ENCOUNTER — Ambulatory Visit (INDEPENDENT_AMBULATORY_CARE_PROVIDER_SITE_OTHER): Payer: Medicare Other | Admitting: Family Medicine

## 2019-10-31 ENCOUNTER — Telehealth (INDEPENDENT_AMBULATORY_CARE_PROVIDER_SITE_OTHER): Payer: Self-pay | Admitting: Family Medicine

## 2019-10-31 ENCOUNTER — Encounter (INDEPENDENT_AMBULATORY_CARE_PROVIDER_SITE_OTHER): Payer: Self-pay | Admitting: Family Medicine

## 2019-10-31 ENCOUNTER — Other Ambulatory Visit (INDEPENDENT_AMBULATORY_CARE_PROVIDER_SITE_OTHER): Payer: Self-pay

## 2019-10-31 VITALS — BP 152/96 | HR 92 | Resp 16 | Wt 154.0 lb

## 2019-10-31 DIAGNOSIS — F419 Anxiety disorder, unspecified: Secondary | ICD-10-CM

## 2019-10-31 DIAGNOSIS — R4589 Other symptoms and signs involving emotional state: Secondary | ICD-10-CM

## 2019-10-31 DIAGNOSIS — F1029 Alcohol dependence with unspecified alcohol-induced disorder: Secondary | ICD-10-CM

## 2019-10-31 DIAGNOSIS — I10 Essential (primary) hypertension: Secondary | ICD-10-CM

## 2019-10-31 MED ORDER — SERTRALINE HCL 25 MG PO TABS
25.0000 mg | ORAL_TABLET | Freq: Every day | ORAL | 0 refills | Status: DC
Start: 2019-10-31 — End: 2019-11-17

## 2019-10-31 MED ORDER — SPIRONOLACTONE 50 MG PO TABS
50.0000 mg | ORAL_TABLET | Freq: Every morning | ORAL | 3 refills | Status: DC
Start: 2019-10-31 — End: 2020-07-21

## 2019-10-31 MED ORDER — SERTRALINE HCL 50 MG PO TABS
50.0000 mg | ORAL_TABLET | Freq: Every day | ORAL | 2 refills | Status: DC
Start: 2019-10-31 — End: 2019-11-17

## 2019-10-31 NOTE — Progress Notes (Signed)
NN outreach call to patient regarding AA services and counseling services.     Patient states she has been told she drinks because she is still grieving the death of her son.     Patient d/c'd [facility] on 7/12 - the behavioral health unit.     NN provided patient with the phone numbers to [facility] and [facility].     Patient advised that if further assistance was needed to reach back out.     Patient verbalized understanding and voiced no further questions or concerns at this time.     Kallie Edward, LPN, [facility]  Health Coach Coordinator

## 2019-10-31 NOTE — Telephone Encounter (Signed)
I referred this patient for nurse navigation services today.    Patient has a long history of alcohol use disorder and recently relapsed.    Patient requested assistance and finding a rehab facility resources in the community to help her to stay busy so she does not drink alcohol due to boredom

## 2019-10-31 NOTE — Patient Instructions (Addendum)
[facility]  [phone]      Depression  Depression is one of the most common mental health problems today. It is not just a state of unhappiness or sadness. It is a true disease. The cause seems to be linked to a drop in chemicals that transmit signals in the brain. Having a family history of depression, alcoholism, or suicide increases the risk. Chronic illness, chronic pain, migraine headaches, and high emotional stress also increase the risk.   Depression is something we tend to recognize in others. But we may have a hard time seeing it in ourselves. It can show in many physical and emotional ways:    Loss of appetite   Overeating   Not being able to sleep   Sleeping too much   Excessive tiredness not linked to physical exertion   Restlessness or irritability   Slowness of movement or speech   Feeling depressed or withdrawn   Loss of interest in things you once enjoyed   Troubleconcentrating, remembering,or making decisions   Thoughts of harming or killing oneself, or thoughts that life is not worth living   Low self-esteem  The treatment for depression may include both medicine and psychotherapy. Antidepressants can reduce suffering. They can also improve the ability to function during the depressed period. Therapy can offer emotional support. It can also help you understand emotional factors that may be causing the depression.   Home care   Ongoing care and support help people manage this disease. Find a healthcare provider and therapist who meet your needs. Seek help when you feel like you may be getting ill.   Be kind to yourself. Make it a point to do things that you enjoy (gardening, walking in nature, going to a movie). Reward yourself for small successes.   Once you start your medicine, expect a slow decrease in your symptoms. Depression will lift gradually, not right away. Ask your healthcare provider how long it will take for the medicine to start  working.   Take care of your physical body. Eat a balanced diet (low in saturated fat and high in fruits and vegetables). Exercise at least 3 times a week for 30 minutes. Even mild to moderate exercise (like brisk walking) can make you feel better.   Don't make major decisions, such as a job change, a divorce, or a marriage until you feel better.   Don't drink alcohol. It can make depression worse.   Take medicine as prescribed. Don't stop your medicine or adjust the dose unless you talk with your healthcare provider.   Don't share your medicine or use someone else's medicine.   Tell all your healthcare providers about all the prescription and over-the-counter medicines, vitamins, and supplements you take.Certain supplements interact with medicines. They can cause dangerous side effects.Ask your pharmacist about medicine interactions when you have questions.   Talk with your family andtrusted friendsabout your feelings and thoughts.Ask them to help you notice behavior changes early. You can then get help and, if needed, your medicine can be adjusted.   Talk with your healthcare provider if you are not getting better. He or she may change your medicine or have you try another treatment.    Follow-up care  Follow up with your healthcare provider as advised.   Call 911  Call 911 if you:    Have suicidal thoughts, a suicide plan, and the means to carry out the plan   Have serious thoughts of  hurting someone else   Have trouble breathing   Arevery confused   Feel very drowsy or havetrouble awakening   Faint or lose consciousness   Have new chest pain that becomes more severe, lasts longer, or spreads into your shoulder, arm, neck, jaw, or back  When to seek medical advice  Call your healthcare provider right away if any of these happen:   Worsening of your symptoms   Feeling extreme depression, fear, anxiety, or anger toward yourself or others   Feeling out of control   Feeling that you may try  to harm yourself or another   Hearing voices that others do not hear   Seeing things that others do not see   Not sleeping or eating for 3 days in a row   Having friends or family express concern over your behavior and ask you to seek help  StayWell last reviewed this educational content on 10/09/2018   2000-2021 [employer], [place]. All rights reserved. This information is not intended as a substitute for professional medical care. Always follow your healthcare professional's instructions.        Alcohol Abuse  Alcoholic drinks harm you when you have too many of them. No set number of drinks defines too much. Drinking that affects your life or your health is called alcohol abuse. Alcohol abuse can hurt your relationships with others. You may lose friends, a spouse, or even your job. You may be abusing alcohol if any of the following are true for you:    Duties at home or with child care suffer because of drinking.   Duties at work or in school suffer because of drinking.   You have missed work or school because of drinking.   You use alcohol while driving or using machinery.   You have legal problems such as arrests because of drinking.   You keep drinking even though it causes serious problems in your life.  Health problems   Alcohol abuse causes many health problems.Sometimes this can happen after only drinking a little." The effects depend on how much you drink at one time, and how often you drink.Alcohol affects all parts of your body   Brain  Alcohol affects the central nervous system. It can damage parts of the brain that control your balance and gait, memory, thinking, and emotions. It can cause:    Memory loss   Blackouts   Depression   Agitation   Sleep problems   Seizures  These changes may be long term (permanent).   Heart and blood vessels  Alcohol can damage heart muscle (cardiomyopathy). This can lead to:    Trouble breathing   Irregular heartbeat   Atrial  fibrillation   Leg swelling   Heart failure  Alcohol also makes the blood vessels stiff. This causes high blood pressure.   All of these problems raise your risk of having a heart attack or stroke.   Liver  Alcohol causes fat to build up in the liver. This affects how the liver works. Alcohol also raises the risk for hepatitis. It can cause:    Belly (abdominal) pain   Belly swelling   Loss of appetite   Yellowed eyes or skin (jaundice)   Bleeding problems   Cirrhosis  This can affect your ability to fight off infections and can cause diabetes.   The liver changes keep it from removing toxins in your blood that can cause brain disease (encephalopathy). This condition cause:    Confusion  Changed level of consciousness   Personality changes   Memory loss   Seizures, coma, and death  The liver changes can also cause the veins in your esophagus and stomach to become thin and swollen with blood (varices). This can cause bleeding and vomiting of blood.   Pancreas  Alcohol can cause swelling (inflammation) of the pancreas (pancreatitis). This can cause belly pain, fever, and diabetes.   Immune system  Alcohol weakens your immune system. This makes it harder for you to fight infections and colds. It also increases the chance of getting pneumonia and tuberculosis.   Cancer  Alcohol raises the risk for several types of cancer. These include cancer of the mouth, esophagus, pharynx, larynx, liver, and breast.   Sexual function  Alcohol can lead to sexual problems.   Home care   These guidelines will help you deal with alcohol abuse:   Admit you have a problem with alcohol.   Ask for help from your healthcare provider. Also ask for help from trusted family members or close friends.   Get help from people trained in dealing with alcohol abuse. This may be one-on-one counseling or group therapy. Or it may be an alcohol treatment program.   Join a self-help group for alcohol abuse such as Alcoholics  Anonymous.   Stay away from people who abuse alcohol or tempt you to drink.  Follow-up care   Follow up with your healthcare provider, or as advised. Contact these groups to get help:    Alcoholics Anonymous (AA).Go to SalaryStart.tn. Or check the phone book for meetings near you.   National Alcohol and Substance Abuse Information Center [facility]). 606-481-9898, www.addictioncareoptions.com   [employer] on Alcoholism and Drug Dependence (NCADD). 800-NCA-CALL 740-468-2780), www.ncadd.org  Call 911  Call 911 if any of these occur:    Trouble breathing or slow, irregular breathing   Chest pain   Sudden weakness on one side of your body or sudden trouble speaking   Heavy bleeding or vomiting blood   Very drowsy or trouble awakening   Fainting or loss of consciousness   Rapid heart rate   Seizure  When to seek medical care   Call your healthcare provider right away if any of these occur:    Confusion   Seeing, hearing, or feeling things that arent there (hallucinations)   Pain in your upper belly that gets worse   Vomiting that continues, vomiting with blood, or black or tarry stools   Severe shakiness  StayWell last reviewed this educational content on 11/08/2016   2000-2021 [employer], [place]. All rights reserved. This information is not intended as a substitute for professional medical care. Always follow your healthcare professional's instructions.

## 2019-10-31 NOTE — Telephone Encounter (Signed)
I have talked with the patient and provided resources.

## 2019-10-31 NOTE — Progress Notes (Signed)
[facility]  [address]  [place], [place], [address]  [phone]  10/31/2019     Patient:   [name]                                                  CSN:        [id number]                                          DOB:       1960-11-12                                                    MRN:        [id number]     Chief Complaint   Patient presents with    Follow-up     3 month f/u      SUBJECTIVE     History was provided by the Patient    HPI: Patient is a 59 y.o. female who presents today for follow-up and to discuss a health concern    Patient reports that she relapsed on alcohol about 3 weeks ago.  She states that there are a lot of things that contributed to this.  However she cannot blame anyone but herself.  Patient states that her roommate in [place] Bobby's cousin moved in with them prior to her relapsing.    Patient states that Bobby's cousin Carolina Sink moved to [place] because she has insurance and is being worked up for possible leukemia.  Patient states that she feels ignored since [place] moved in.   She also feels like she cannot garden the way she wants to without Rita's input    Patient states that she checked herself into a hospital in [place] due to her excessive alcohol intake and having withdrawals with tremors when she was not drinking    Patient states that she was transferred to [facility] where she was discharged home after 4 days.    Patient states that since she has been back  home she has resumed drinking but is not drinking as much.  She has been drinking 2 wine coolers a day and her last drink was last night.  Patient denies having tremors or withdrawal symptoms.  Patient states that she is only one buying alcohol to their house    Patient states that she does not feel well and wants to check into a 30-day rehab  center.  However she is having trouble finding a rehab that takes Medicare or Medicaid  Patient states that she wanted to go to [facility] rehab for 30 days.  However she is told that she will have to pay $25,000 for this when she does not have  So patient has been feeling depressed and very anxious because of this.  Patient also reports that she has been having trouble sleeping because of her mental health.  She has been going to sleep around 10 to 11 PM but always wakes up at 3 AM and cannot go back to sleep.  She was prescribed trazodone while  at the hospital in [place].  However patient states that she does not want to take it because the trazodone makes her too groggy    Patient states another reason why she drinks is out of boredom.  She was told that she cannot volunteer since she is on disability  Patient wants assistance with finding an inpatient alcohol rehab center that she can go in order to regain her sobriety and improve her mental health    Patient states that she has reached out to her friend Shanda Bumps who has been sober from alcohol for 12 years for advice on what to do    Patient states that her roommate in the [place] area who she moved away from because of his alcohol use disorder has been sober for 45 days    Patient states that while in rehab her blood pressure readings were very high so she was restarted on lisinopril 10 mg a day.  Patient states that she has been out of spironolactone for a while and requests a refill of this.    Patient states that she has not needed to take Lasix in a long time since she has not had lower extremity edema      No LMP recorded. Patient is postmenopausal.      Past Medical History:   Diagnosis Date    Asthma without status asthmaticus     Disorder of liver     multifocal cystic irregularities    DVT (deep venous thrombosis)     Gastroesophageal reflux disease     H/O ETOH abuse     sober 2 years until 03/2014    Hypertension     Pancreatitis       Past Surgical History:   Procedure Laterality Date    CHOLECYSTECTOMY      COLONOSCOPY  12/26/2012    Procedure: COLONOSCOPY;  Surgeon: Gwenith Spitz, MD;  Location: [facility];  Service: Gastroenterology;  Laterality: N/A;    COLONOSCOPY, POLYPECTOMY  12/26/2012    Procedure: COLONOSCOPY, POLYPECTOMY;  Surgeon: Gwenith Spitz, MD;  Location: [facility];  Service: Gastroenterology;  Laterality: N/A;    DEBRIDEMENT & IRRIGATION, WOUND CLOSURE Right 08/29/2018    Procedure: WASHOUT AND CLOSURE OF TRAUMATIC WOUND, RIGHT FOREARM;  Surgeon: Arnoldo Hooker, MD;  Location: [facility];  Service: General;  Laterality: Right;  Right forearm I&D poss. wound closure    ORIF, HUMERUS, DISTAL Left 04/08/2019    Procedure: ORIF, HUMERUS, DISTAL;  Surgeon: Randolm Idol, MD;  Location: [facility];  Service: Orthopedics;  Laterality: Left;  ORIF LEFT DISTAL HEMERUS    PANCREATECTOMY  01/2011    partial    THORACENTESIS Right 2012    pl effusion     Family History   Adopted: Yes     Social History     Social History Narrative    Not on file     Allergies   Allergen Reactions    Norvasc [Amlodipine] Other (See Comments)     Unsteady on feet    Morphine Hallucinations    Oxycodone Headaches     severe     Outpatient Medications Marked as Taking for the 10/31/19 encounter (Office Visit) with Penny Pia, MD   Medication Sig Dispense Refill    albuterol (PROVENTIL HFA;VENTOLIN HFA) 108 (90 Base) MCG/ACT inhaler Inhale 2 puffs into the lungs every 6 (six) hours as needed for Wheezing      cetirizine (ZyrTEC) 10 MG tablet Take 1 tablet (  10 mg total) by mouth daily (Patient taking differently: Take 10 mg by mouth as needed   ) 90 tablet 3    Cholecalciferol (Vitamin D3) 1.25 MG (50000 UT) Cap Take 1 capsule by mouth every 14 (fourteen) days 12 capsule 1    ferrous sulfate 325 (65 FE) MG EC tablet Take 1 tablet (325 mg total) by mouth 2 (two) times daily with meals (Patient taking  differently: Take 325 mg by mouth daily   ) 60 tablet 3    fluticasone (FLONASE) 50 MCG/ACT nasal spray 1 spray by Nasal route daily as needed (allergies)      folic acid (FOLVITE) 1 MG tablet Take 1 tablet (1 mg total) by mouth every morning 90 tablet 3    furosemide (LASIX) 40 MG tablet Take 1 tablet (40 mg total) by mouth daily as needed (swelling in feet or weight gain) 30 tablet 5    Multiple Vitamins-Minerals (MULTIVITAMIN WITH MINERALS) tablet Take 1 tablet by mouth every morning.         pancrelipase, Lip-Prot-Amyl, (Creon) 24000-76000 units Cap DR Particles Take 72,000 units of lipase by mouth 3 (three) times daily with meals Take 3 capsules (72000 units of lipase) 3 times daily with meals      pantoprazole (PROTONIX) 40 MG tablet Take 40 mg by mouth every morning      sodium bicarbonate 650 MG tablet Take 1 tablet (650 mg total) by mouth 3 (three) times daily 90 tablet 5    spironolactone (ALDACTONE) 50 MG tablet Take 1 tablet (50 mg total) by mouth every morning 30 tablet 3    thiamine (B-1) 100 MG tablet Take 1 tablet (100 mg total) by mouth every morning 90 tablet 3    [DISCONTINUED] spironolactone (ALDACTONE) 50 MG tablet Take 50 mg by mouth every morning         Review of Systems  Review of Systems: Items that patient complains of are in bold.  Items that the patient denies are not bolded.   General: Fever, Chills.   Eyes: Discharge  Ears/Nose/Throat: Earache, Congestion, Sore Throat.   Cardiovascular: Chest Pain, Palpitations, Peripheral Edema.   Respiratory: Cough, Dyspnea.   Gastrointestinal: Nausea, Vomiting, Diarrhea, Constipation, Abdominal Pain, Melena, Hematochezia.   Genitourinary:  Dysuria, Hematuria, Discharge or Incontinence.  Musculoskeletal:  Joint Pain.   Skin: Rash   Neurologic: Weakness.   Heme/Lymphatic: Abnormal Bruising.  Psych:?Depressed moods,?anxiety,?trouble sleeping,?suicidal ideation,?homicidal ideation,?visual or auditory hallucinations, substance use, mood  disorder      PHYSICAL EXAM   BP (!) 152/96 (BP Site: Left arm, Patient Position: Sitting)    Pulse 92    Resp 16    Wt 69.9 kg (154 lb)    SpO2 95%    BMI 25.63 kg/m     Physical Exam    Constitutional: Well developed, well nourished, active, in no apparent distress, overweight  HENT:   Head: Normocephalic, atraumatic  Ears: No external lesions.  Nose: No external lesions. No epistaxis or drainage.  Eyes: PERRL. No scleral icterus. No conjunctival injection. EOMI.  Neck: Trachea is midline. No JVD. Normal range of motion. No apparent masses.  Cardiovascular: Regular rhythm, S1 normal and S2 normal.    No murmur heard.  Pulmonary/Chest: Effort normal. Lungs clear to auscultation bilaterally.   Abdominal: Soft, non-tender, non-distended. No masses.   Genitourinay/Anorectal: Defferred  Musculoskeletal: Normal range of motion. Gait normal. No  apparent injury. bonydeformity is noted by left elbow   Neurological: Pt is alert. Cranial nerves  are grossly intact. Moving all extremities without apparent deficit.   Psychiatric: Affect is depressed and tearful.There is no agitation.   Skin: Skin is warm, dry, well perfused. No rash noted. No cyanosis. No pallor. No apparent wound.      LABS     Lab Results   Component Value Date    CHOL 216 (H) 05/25/2019    HDL 90 05/25/2019    TRIG 161 05/25/2019    NA 140 08/01/2019    K 3.6 08/01/2019    BUN 8 08/01/2019    GLU 126 (H) 08/01/2019    CA 10.0 08/01/2019    AST 24 08/01/2019    ALT 18 08/01/2019    ALKPHOS 157 (H) 08/01/2019    TSH 1.09 05/23/2019         UPDATED IMMUNIZATIONS     Immunization History   Administered Date(s) Administered    Influenza quad 6 MOS to 64 YRS (Flulaval/Fluarix) 01/29/2019    Influenza quadrivalent (IM) PF 3 Yrs & greater 12/26/2017    Pneumococcal 23 valent 04/15/2019    Tdap 02/28/2018    [facility]) Vaccine Recombinant 02/07/2019         ASSESSMENT and PLAN     1. Alcohol dependence with unspecified alcohol-induced disorder   Ambulatory referral to Nurse Navigators   2. Depressed mood     3. Anxiety  sertraline (Zoloft) 25 MG tablet    sertraline (Zoloft) 50 MG tablet   4. Hypertension, essential  spironolactone (ALDACTONE) 50 MG tablet         We will have nurse navigator to assist patient with finding a rehab center for treatment of her alcohol use disorder  Patient is advised to [facility] to see if they accept her insurance while waiting to hear from the nurse navigator  Patient agrees to trial of Zoloft to help with depressed moods and anxiety  Patient is encouraged to go to AA meetings in the meantime while waiting to get into a rehab center and sister on her several people who supports her sobriety  Patient is also advised to consider moving to a new residence if her current roommates are contributing to her relapsing on alcohol use  Side effects of medications were discussed  Contact the office if problems arise  ED precautions were given    Patient is advised to monitor her blood pressure regularly at home and to contact the office if is persistently above 140/90  She is advised to consider an alternative medication if the lisinopril causes her to be dizzy        Follow up  In 4 weeks and PRN.      We will consider doing labs in the future to assess patient's electrolytes and liver function at her next visit.  We will also consider giving patient a trial of naltrexone at her next visit if she has not found an inpatient rehab to go in for treatment of her alcohol use disorder     We will discuss patient try Wellbutrin at her next visit to see if will help her to quit smoking    I spent a minimum of 35 minutes with the patient during  this encounter    Dragon dictation was used, please excuse typos    Penny Pia, MD

## 2019-11-04 ENCOUNTER — Other Ambulatory Visit (INDEPENDENT_AMBULATORY_CARE_PROVIDER_SITE_OTHER): Payer: Self-pay

## 2019-11-04 DIAGNOSIS — F1029 Alcohol dependence with unspecified alcohol-induced disorder: Secondary | ICD-10-CM

## 2019-11-04 DIAGNOSIS — R4589 Other symptoms and signs involving emotional state: Secondary | ICD-10-CM

## 2019-11-04 NOTE — Progress Notes (Signed)
NN received call from patient requesting Detox facilities for alcohol.  NN discussed this with the patient a day or two ago and she was uncertain where she was going to be living.  [place] or [place]. Patient states today she will be remaining in [place].     NN provided numbers and address to [facility], [facility], and [facility].     Patient states she will give them a call and try to get assistance.     Kallie Edward, LPN, [facility]  Health Coach Coordinator

## 2019-11-17 ENCOUNTER — Other Ambulatory Visit (INDEPENDENT_AMBULATORY_CARE_PROVIDER_SITE_OTHER): Payer: Self-pay

## 2019-11-17 DIAGNOSIS — F419 Anxiety disorder, unspecified: Secondary | ICD-10-CM

## 2019-11-17 MED ORDER — SERTRALINE HCL 50 MG PO TABS
50.00 mg | ORAL_TABLET | Freq: Every day | ORAL | 1 refills | Status: AC
Start: 2019-11-17 — End: 2020-02-15

## 2019-11-17 NOTE — Telephone Encounter (Signed)
Received fax request for Sertraline . Last seen 7/23 .  I pended RX for your approval.  Thanks  L. Calee Nugent, LPN

## 2019-11-28 ENCOUNTER — Ambulatory Visit (INDEPENDENT_AMBULATORY_CARE_PROVIDER_SITE_OTHER): Payer: Medicare Other | Admitting: Family Medicine

## 2020-01-06 ENCOUNTER — Other Ambulatory Visit (INDEPENDENT_AMBULATORY_CARE_PROVIDER_SITE_OTHER): Payer: Self-pay | Admitting: Family Medicine

## 2020-01-06 DIAGNOSIS — D649 Anemia, unspecified: Secondary | ICD-10-CM

## 2020-01-16 ENCOUNTER — Ambulatory Visit (INDEPENDENT_AMBULATORY_CARE_PROVIDER_SITE_OTHER): Payer: Medicare Other | Admitting: Family Medicine

## 2020-01-16 ENCOUNTER — Telehealth (INDEPENDENT_AMBULATORY_CARE_PROVIDER_SITE_OTHER): Payer: Self-pay | Admitting: Family Medicine

## 2020-01-16 DIAGNOSIS — F1021 Alcohol dependence, in remission: Secondary | ICD-10-CM

## 2020-01-16 DIAGNOSIS — Z760 Encounter for issue of repeat prescription: Secondary | ICD-10-CM

## 2020-01-16 NOTE — Telephone Encounter (Signed)
Pt appt. rsc'd due to provider being out of office, Needs 2 scripts refilled to last till appt on 11.21.   Ondansetron 4 mg 1 tablet po 4 times a day.  Vitamin b-1 100mg  1 tablet by mouth every day.  [facility] [place].  Please advise

## 2020-01-19 MED ORDER — ONDANSETRON 4 MG PO TBDP
4.0000 mg | ORAL_TABLET | Freq: Three times a day (TID) | ORAL | 1 refills | Status: DC | PRN
Start: 2020-01-19 — End: 2020-07-21

## 2020-01-19 MED ORDER — THIAMINE HCL 100 MG PO TABS
100.0000 mg | ORAL_TABLET | Freq: Every morning | ORAL | 3 refills | Status: DC
Start: 2020-01-19 — End: 2020-05-31

## 2020-01-19 NOTE — Telephone Encounter (Signed)
Refills were sent

## 2020-02-20 ENCOUNTER — Ambulatory Visit (INDEPENDENT_AMBULATORY_CARE_PROVIDER_SITE_OTHER): Payer: Medicare Other | Admitting: Family Medicine

## 2020-02-20 ENCOUNTER — Telehealth (RURAL_HEALTH_CENTER): Payer: Self-pay | Admitting: Family Medicine

## 2020-02-20 ENCOUNTER — Other Ambulatory Visit
Admission: RE | Admit: 2020-02-20 | Discharge: 2020-02-20 | Disposition: A | Discharge status: Home / Self Care | Payer: Medicare Other | Source: Ambulatory Visit | Attending: Family Medicine | Admitting: Family Medicine

## 2020-02-20 ENCOUNTER — Encounter (INDEPENDENT_AMBULATORY_CARE_PROVIDER_SITE_OTHER): Payer: Self-pay | Admitting: Family Medicine

## 2020-02-20 VITALS — BP 100/62 | HR 91 | Resp 18 | Wt 146.3 lb

## 2020-02-20 DIAGNOSIS — Z1231 Encounter for screening mammogram for malignant neoplasm of breast: Secondary | ICD-10-CM

## 2020-02-20 DIAGNOSIS — K529 Noninfective gastroenteritis and colitis, unspecified: Secondary | ICD-10-CM

## 2020-02-20 DIAGNOSIS — F1029 Alcohol dependence with unspecified alcohol-induced disorder: Secondary | ICD-10-CM

## 2020-02-20 DIAGNOSIS — R0602 Shortness of breath: Secondary | ICD-10-CM

## 2020-02-20 DIAGNOSIS — Z9049 Acquired absence of other specified parts of digestive tract: Secondary | ICD-10-CM

## 2020-02-20 DIAGNOSIS — Z23 Encounter for immunization: Secondary | ICD-10-CM

## 2020-02-20 DIAGNOSIS — I10 Essential (primary) hypertension: Secondary | ICD-10-CM

## 2020-02-20 DIAGNOSIS — J439 Emphysema, unspecified: Secondary | ICD-10-CM

## 2020-02-20 DIAGNOSIS — F17210 Nicotine dependence, cigarettes, uncomplicated: Secondary | ICD-10-CM

## 2020-02-20 LAB — COMPREHENSIVE METABOLIC PANEL
ALT: 34 U/L (ref 0–55)
AST (SGOT): 75 U/L — ABNORMAL HIGH (ref 10–42)
Albumin/Globulin Ratio: 1.06 Ratio (ref 0.80–2.00)
Albumin: 3.5 gm/dL (ref 3.5–5.0)
Alkaline Phosphatase: 249 U/L — ABNORMAL HIGH (ref 40–145)
Anion Gap: 15.8 mMol/L (ref 7.0–18.0)
BUN / Creatinine Ratio: 5.6 Ratio — ABNORMAL LOW (ref 10.0–30.0)
BUN: 5 mg/dL — ABNORMAL LOW (ref 7–22)
Bilirubin, Total: 0.6 mg/dL (ref 0.1–1.2)
CO2: 21 mMol/L (ref 20–30)
Calcium: 9.3 mg/dL (ref 8.5–10.5)
Chloride: 102 mMol/L (ref 98–110)
Creatinine: 0.9 mg/dL (ref 0.60–1.20)
EGFR: 70 mL/min/{1.73_m2} (ref 60–150)
Globulin: 3.3 gm/dL (ref 2.0–4.0)
Glucose: 116 mg/dL — ABNORMAL HIGH (ref 71–99)
Osmolality Calculated: 270 mOsm/kg — ABNORMAL LOW (ref 275–300)
Potassium: 2.8 mMol/L — CL (ref 3.5–5.3)
Protein, Total: 6.8 gm/dL (ref 6.0–8.3)
Sodium: 136 mMol/L (ref 136–147)

## 2020-02-20 LAB — CBC AND DIFFERENTIAL
Basophils %: 0.9 % (ref 0.0–3.0)
Basophils Absolute: 0.1 10*3/uL (ref 0.0–0.3)
Eosinophils %: 0.8 % (ref 0.0–7.0)
Eosinophils Absolute: 0.1 10*3/uL (ref 0.0–0.8)
Hematocrit: 42.1 % (ref 36.0–48.0)
Hemoglobin: 14 gm/dL (ref 12.0–16.0)
Lymphocytes Absolute: 2 10*3/uL (ref 0.6–5.1)
Lymphocytes: 30 % (ref 15.0–46.0)
MCH: 36 pg — ABNORMAL HIGH (ref 28–35)
MCHC: 33 gm/dL (ref 32–36)
MCV: 108 fL — ABNORMAL HIGH (ref 80–100)
MPV: 7 fL (ref 6.0–10.0)
Monocytes Absolute: 0.6 10*3/uL (ref 0.1–1.7)
Monocytes: 9.4 % (ref 3.0–15.0)
Neutrophils %: 59 % (ref 42.0–78.0)
Neutrophils Absolute: 3.8 10*3/uL (ref 1.7–8.6)
PLT CT: 236 10*3/uL (ref 130–440)
RBC: 3.91 10*6/uL (ref 3.80–5.00)
RDW: 13 % (ref 11.0–14.0)
WBC: 6.5 10*3/uL (ref 4.0–11.0)

## 2020-02-20 LAB — LIPASE: Lipase: 30 U/L (ref 8–78)

## 2020-02-20 MED ORDER — CHOLESTYRAMINE 4 G PO PACK
1.0000 | PACK | Freq: Three times a day (TID) | ORAL | 2 refills | Status: DC
Start: 2020-02-20 — End: 2020-07-21

## 2020-02-20 NOTE — Telephone Encounter (Signed)
Lab called stating the patient had a potassium of 2.8.      I called the patient but got her VM and left a message for her to call me back.     02/21/20 - I called the patient today and advised her of her low potassium  She is taking klor-con daily and I advised her to take 2 a day this weekend and reach  out to her PCM on Monday to find out when they wanted to recheck her level.   She agreed.

## 2020-02-20 NOTE — Progress Notes (Signed)
Patient consented to receiving treatment in-office amidst current COVID-19 pandemic. Patient (and any support persons present) wore ear loop face masks throughout visit. Provider PPE included surgical mask or N95 w/ face shield or eye protection.                                                                                                                     [facility]  [address]  [place], [place], [address]  [phone]  02/20/2020     Patient:   [name]                                                  CSN:        [id number]                                          DOB:       1960/06/09                                                    MRN:        [id number]     Chief Complaint   Patient presents with    Follow-up     Diarrhea not controlled.     SUBJECTIVE     History was provided by the Patient.    HPI: Patient is a 59 y.o. female who presents today for follow-up and to discuss a health concern    Hernia - patient has incisional hernia that she states previous evaluations by hernia specialist in [place] advised her shouldn't be closed due to possibly of further complications. She states that it is normally "popped out" and spontaneously can be reducible.     Diarrhea:  10-2 bm a day.  rx imidoun 2 is helpful but sometimes causes constipation . Nausea.   She has a history of cholecystectomy and states she tries to avoid fatty, fried food but her roommates whom she shares cooking with is not as mindful of avoidance of fatty,fried meals.     Alcohol - Has not had hard liquor since 10/2019. Still has occasional wine coolers. 2 months ago.    She is going to anger management classes and AA. [facility].   She likes group therapy. Patient reports that she relapsed on alcohol about 3 weeks ago.  She states that there are a lot of things that contributed to this.     HTN - Patient was given a blood pressure medication last visit  but has not been taking it because it makes her dizzy.  She takes sprinolactone daily. She does not check her blood pressure at home.     H/o of asthma   Current tobacco smoker 1/2-1 pack a day; started 58 years old quit for 7 years; restarted   States her SOB has been mildly worse recently.    Uses her albuterol inhaler 2x a month.       No LMP recorded. Patient is postmenopausal.      Past Medical History:   Diagnosis Date    Asthma without status asthmaticus     Disorder of liver     multifocal cystic irregularities    DVT (deep venous thrombosis)     Gastroesophageal reflux disease     H/O ETOH abuse     sober 2 years until 03/2014    Hypertension     Pancreatitis      Past Surgical History:   Procedure Laterality Date    CHOLECYSTECTOMY      COLONOSCOPY  12/26/2012    Procedure: COLONOSCOPY;  Surgeon: Gwenith Spitz, MD;  Location: [facility];  Service: Gastroenterology;  Laterality: N/A;    COLONOSCOPY, POLYPECTOMY  12/26/2012    Procedure: COLONOSCOPY, POLYPECTOMY;  Surgeon: Gwenith Spitz, MD;  Location: [facility];  Service: Gastroenterology;  Laterality: N/A;    DEBRIDEMENT & IRRIGATION, WOUND CLOSURE Right 08/29/2018    Procedure: WASHOUT AND CLOSURE OF TRAUMATIC WOUND, RIGHT FOREARM;  Surgeon: Arnoldo Hooker, MD;  Location: [facility];  Service: General;  Laterality: Right;  Right forearm I&D poss. wound closure    ORIF, HUMERUS, DISTAL Left 04/08/2019    Procedure: ORIF, HUMERUS, DISTAL;  Surgeon: Randolm Idol, MD;  Location: [facility];  Service: Orthopedics;  Laterality: Left;  ORIF LEFT DISTAL HEMERUS    PANCREATECTOMY  01/2011    partial    THORACENTESIS Right 2012    pl effusion     Family History   Adopted: Yes     Social History     Social History Narrative    Not on file     Allergies   Allergen Reactions    Norvasc [Amlodipine] Other (See Comments)     Unsteady on feet    Morphine Hallucinations    Oxycodone Headaches     severe     Outpatient  Medications Marked as Taking for the 02/20/20 encounter (Office Visit) with Penny Pia, MD   Medication Sig Dispense Refill    albuterol (PROVENTIL HFA;VENTOLIN HFA) 108 (90 Base) MCG/ACT inhaler Inhale 2 puffs into the lungs every 6 (six) hours as needed for Wheezing      Cholecalciferol (Vitamin D3) 1.25 MG (50000 UT) Cap Take 1 capsule by mouth every 14 (fourteen) days 12 capsule 1    ferrous sulfate 325 (65 FE) MG EC tablet TAKE 1 TABLET (325 MG TOTAL) BY MOUTH 2 (TWO) TIMES DAILY WITH MEALS 180 tablet 0    fluticasone (FLONASE) 50 MCG/ACT nasal spray 1 spray by Nasal route daily as needed (allergies)      folic acid (FOLVITE) 1 MG tablet Take 1 tablet (1 mg total) by mouth every morning 90 tablet 3    furosemide (LASIX) 40 MG tablet Take 1 tablet (40 mg total) by mouth daily as needed (swelling in feet or weight gain) 30 tablet 5    Multiple Vitamins-Minerals (MULTIVITAMIN WITH MINERALS) tablet Take 1 tablet by mouth every morning.         ondansetron (Zofran ODT) 4 MG disintegrating tablet Take 1 tablet (4 mg  total) by mouth every 8 (eight) hours as needed for Nausea 30 tablet 1    pancrelipase, Lip-Prot-Amyl, (Creon) 24000-76000 units Cap DR Particles Take 72,000 units of lipase by mouth 3 (three) times daily with meals Take 3 capsules (72000 units of lipase) 3 times daily with meals      pantoprazole (PROTONIX) 40 MG tablet Take 40 mg by mouth every morning      sodium bicarbonate 650 MG tablet Take 1 tablet (650 mg total) by mouth 3 (three) times daily 90 tablet 5    spironolactone (ALDACTONE) 50 MG tablet Take 1 tablet (50 mg total) by mouth every morning 30 tablet 3    thiamine (B-1) 100 MG tablet Take 1 tablet (100 mg total) by mouth every morning 90 tablet 3       Review of Systems  Review of Systems: Items that patient complains of are in bold.  Items that the patient denies are not bolded.   General: Fever, Chills.   Eyes: Discharge  Ears/Nose/Throat: Earache, Congestion, Sore  Throat.   Cardiovascular: Chest Pain, Palpitations, Peripheral Edema.   Respiratory: Cough, Dyspnea.   Gastrointestinal: Nausea, Vomiting, Diarrhea, Constipation, Abdominal Pain, Melena, Hematochezia. hernia  Genitourinary:  Dysuria, Hematuria, Discharge or Incontinence.  Musculoskeletal:  Joint Pain.   Skin: Rash   Neurologic: Weakness.   Heme/Lymphatic: Abnormal Bruising.  Psych: suicidal ideation,?homicidal ideation,?visual or auditory hallucinations, substance use, mood disorder      PHYSICAL EXAM   BP 100/62 (BP Site: Left arm, Patient Position: Sitting, Cuff Size: Medium)    Pulse 91    Resp 18    Wt 66.4 kg (146 lb 4.8 oz)    SpO2 98%    BMI 24.35 kg/m     Physical Exam    Constitutional: Well developed, well nourished, active, in no apparent distress.  HENT:   Mask worn throughout visit.  Eyes: PERRL. No scleral icterus. No conjunctival injection. EOMI.  Neck: Trachea is midline. No JVD. Normal range of motion. No apparent masses.  Cardiovascular: Regular rhythm, S1 normal and S2 normal.    No murmur heard.  Pulmonary/Chest: Effort normal. Lungs clear to auscultation bilaterally. Prolonged expiratory phase.  Abdominal: Soft, non-tender, non-distended. No masses. RLQ Large incisional hernia present >8 cm that was reduced.  Genitourinay/Anorectal: Deferred  Musculoskeletal: Normal range of motion. Gait normal. No deformity or apparent injury.   Neurological: Pt is alert. Cranial nerves are grossly intact. Moving all extremities without apparent deficit.   Psychiatric: Affect is appropriate. There is no agitation.   Skin: Skin is warm, dry, well perfused. No rash noted. No cyanosis. No pallor. No apparent wound.      LABS     Lab Results   Component Value Date    CHOL 216 (H) 05/25/2019    HDL 90 05/25/2019    TRIG 161 05/25/2019    NA 140 08/01/2019    K 3.6 08/01/2019    BUN 8 08/01/2019    GLU 126 (H) 08/01/2019    CA 10.0 08/01/2019    AST 24 08/01/2019    ALT 18 08/01/2019    ALKPHOS 157 (H) 08/01/2019     TSH 1.09 05/23/2019         UPDATED IMMUNIZATIONS     Immunization History   Administered Date(s) Administered    Influenza quad 6 MOS to 64 YRS (Flulaval/Fluarix) 01/29/2019, 02/20/2020    Influenza quadrivalent (IM) PF 3 Yrs & greater 12/26/2017    Pneumococcal 23 valent 04/15/2019  Tdap 02/28/2018    [facility]) Vaccine Recombinant 02/07/2019         ASSESSMENT and PLAN     1. Pulmonary emphysema, unspecified emphysema type  CT SCREENING LUNG LOW DOSE   2. Alcohol dependence with unspecified alcohol-induced disorder  Lipase   3. Chronic diarrhea  CBC and differential    Comprehensive metabolic panel   4. SOB (shortness of breath)  Pulmonary function test   5. History of cholecystectomy     6. Nicotine dependence, cigarettes, uncomplicated  CT SCREENING LUNG LOW DOSE   7. Breast cancer screening by mammogram  Mammo Screening w/[facility] Bilateral   8. Immunization due  Flu vaccine QUADRIVALENT (PF) 6 months and older (FLULAVAL/FLUARIX)     Check BP and keep a log to bring to next appointment. Continue taking spironolactone. Patient is advised to monitor her blood pressure regularly at home and to contact the office if is persistently above 140/90  Continue to monitor diarrhea. Patient advised to avoid greasy, fatty food and to keep a food log with diarrhea symptoms. Patient given a prescription for cholestyramine 4 g packets to trial.   Patient encouraged to continue to go to [employer] and counseling. Advised to continue to reduce alcohol intake.   CT angiogram earlier this year showed evidence of emphysema. Ordered pulmonary function tests to evaluate severity.   Routine Blood work ordered   Referral for mammogram  Referral for low dose CT to be completed after 05/2020  counseled pt on the adverse effects of smoking for about 3 minutes  Contact the office if problems arise  ED precautions were given  Pt is advised to consider a general surgery referral for management of abdominal hernia. ED precautions  given for hernia incarceration ad strangulation    Follow up In 2 months and PRN.     Will discuss patient try Wellbutrin at her next visit to see if will help her to quit smoking once she is ready to quit      Penny Pia, MD    Total time of 35 minutes spent today including reviewing chart prior to the appointment, exam and explanation of exam findings, treatment options and recommendations, and completion of chart notes following the appointment.     This note was completed using dragon medical speech recognition software. Grammatical errors, random word insertions, pronoun errors, incorrect word insertion, misspellings  and incomplete sentences are occasional consequences of this technology due to software limitations. If there are questions or concerns about the content of this note or information contained within the body of this dictation they should be addressed with the provider for clarification.    The patient verbally consents to portions of the visit being conducted by PA student under direct supervision from provider.

## 2020-02-20 NOTE — Patient Instructions (Signed)
Treating Diarrhea  Diarrhea happens when you have loose, watery, or frequent bowel movements. It is a common problem with many causes. Most cases of diarrhea clear up on their own. But certain cases may need treatment. Be sure to see your healthcare provider if your symptoms don't get better in a few days.   Getting relief  Treatment of diarrhea depends on its cause. Diarrhea caused by bacterial or parasite infection is often treated with antibiotics. Diarrhea caused by other factors, such as a stomach virus, often improves with simple home treatment. The tips below may also help ease your symptoms.     · Drink plenty of fluids. This helps prevent too much fluid loss (dehydration). Water, clear soups, and electrolyte solutions are good choices. Don't take alcohol, coffee, tea, or milk. These can irritate your intestines and make symptoms worse.  · Suck on ice chips if drinking makes you queasy.  · Return to your normal diet slowly. You may want to eat bland foods at first, such as rice and toast. Also, you may need to stay away from certain foods for a while, such as dairy products. These can make symptoms worse. Ask your healthcare provider if there are any other foods you should stay away from.  · If you were prescribed antibiotics, take them as directed.  · Don't take anti-diarrhea medicines without asking your provider first.  Call your healthcare provider   Call your healthcare provider if you have any of the following:    · A fever of 100.4° F ( 38.0°C) or higher, or as directed by your provider  · Chills  · Severe pain  · Worsening diarrhea or diarrhea for more than 2 days  · Bloody vomit or stool  · Signs of dehydration (dizziness, dry mouth and tongue, rapid pulse, dark urine)  [employer] last reviewed this educational content on 09/08/2017    © 2000-2021 [employer]. All rights reserved. This information is not intended as a substitute for professional medical care. Always follow your  healthcare professional's instructions.

## 2020-02-23 ENCOUNTER — Other Ambulatory Visit (INDEPENDENT_AMBULATORY_CARE_PROVIDER_SITE_OTHER): Payer: Self-pay | Admitting: Family Medicine

## 2020-02-23 DIAGNOSIS — E876 Hypokalemia: Secondary | ICD-10-CM

## 2020-02-23 DIAGNOSIS — K529 Noninfective gastroenteritis and colitis, unspecified: Secondary | ICD-10-CM

## 2020-02-23 MED ORDER — POTASSIUM CHLORIDE CRYS ER 20 MEQ PO TBCR
20.0000 meq | EXTENDED_RELEASE_TABLET | Freq: Three times a day (TID) | ORAL | 2 refills | Status: DC
Start: 2020-02-23 — End: 2020-07-21

## 2020-02-23 NOTE — Telephone Encounter (Signed)
MLTCM.   L. Maddi Collar,LPN

## 2020-02-23 NOTE — Telephone Encounter (Signed)
MLTCM.   L. Nettie Wyffels,LPN

## 2020-02-23 NOTE — Telephone Encounter (Signed)
Inform patient that has severely low potassium levels likely because of the diarrhea.  She should increase her potassium dose to 20 mEq 3 times a day.  I have placed on the lab for her to have her potassium levels repeated ASAP.    Also her liver enzymes were high.  So advised her to abstain from alcohol use.    Her red blood cells are bigger than normal which is likely due to her alcohol use.  She should take her multivitamin folic acid and thiamine daily in order to treat any vitamin deficiencies that might be going on because of alcohol use    Inform patient labs show that she is at risk of developing diabetes.  She should decrease her intake of sweets, junk food, carbohydrates such as rice, potatoes, pasta as well as increase  her activity level to help with this.

## 2020-02-24 NOTE — Telephone Encounter (Signed)
Reviewed results and plan with Pt  she will call office to schedule lab .  L. Shifra Swartzentruber ,LPN

## 2020-02-26 ENCOUNTER — Telehealth: Payer: Self-pay | Admitting: Family Medicine

## 2020-02-26 NOTE — Telephone Encounter (Signed)
CALLED AND LVM TO SCHED PFT 02/26/20

## 2020-04-20 ENCOUNTER — Telehealth (INDEPENDENT_AMBULATORY_CARE_PROVIDER_SITE_OTHER): Payer: Self-pay | Admitting: Family Medicine

## 2020-04-20 DIAGNOSIS — Z20822 Contact with and (suspected) exposure to covid-19: Secondary | ICD-10-CM

## 2020-04-20 NOTE — Telephone Encounter (Signed)
I was not able to reach Pt  left message to find out if she was having any symptoms.  Alwyn Pea, LPN

## 2020-04-20 NOTE — Telephone Encounter (Signed)
Patient called stating that they have been exposed to multiple people in their household and needs a negative test to go back to alcohol abuse classes. Would like an order for a covid test

## 2020-04-22 NOTE — Telephone Encounter (Signed)
Dr. Erick Blinks for you    I attempted to call Pt..today . No answer a detailed message was left that  she would need to call us need more information and she may need  to be seen .   L. Kasheem Toner ,LPN

## 2020-04-22 NOTE — Telephone Encounter (Signed)
ML order for COVID PCR was place to call us for more information.  Alwyn Pea, LPN

## 2020-04-22 NOTE — Addendum Note (Signed)
Addended byBosie Clos, Rubi Tooley A on: 04/22/2020 03:18 PM     Modules accepted: Orders

## 2020-04-22 NOTE — Telephone Encounter (Signed)
covid pcr order was placed

## 2020-04-23 NOTE — Telephone Encounter (Signed)
Reviewed message and plan with Pt.  She will go either today or tomorrow.  L.Zorina Mallin, LPN

## 2020-04-26 ENCOUNTER — Ambulatory Visit (INDEPENDENT_AMBULATORY_CARE_PROVIDER_SITE_OTHER): Payer: Medicare Other | Admitting: Family Medicine

## 2020-05-13 ENCOUNTER — Telehealth (INDEPENDENT_AMBULATORY_CARE_PROVIDER_SITE_OTHER): Payer: Self-pay

## 2020-05-13 NOTE — Telephone Encounter (Signed)
Received a chang in pharmacy to [employer] . I left message for pt to verify this change.  Paper work on [employer].  L. Emila Steinhauser,LPN

## 2020-05-31 ENCOUNTER — Other Ambulatory Visit (INDEPENDENT_AMBULATORY_CARE_PROVIDER_SITE_OTHER): Payer: Self-pay

## 2020-05-31 DIAGNOSIS — E559 Vitamin D deficiency, unspecified: Secondary | ICD-10-CM

## 2020-05-31 DIAGNOSIS — F1021 Alcohol dependence, in remission: Secondary | ICD-10-CM

## 2020-05-31 MED ORDER — CREON 24000-76000 UNITS PO CPEP
72000.0000 [IU] | ORAL_CAPSULE | Freq: Three times a day (TID) | ORAL | 1 refills | Status: DC
Start: 2020-05-31 — End: 2020-07-21

## 2020-05-31 MED ORDER — VITAMIN D3 1.25 MG (50000 UT) PO CAPS
1.0000 | ORAL_CAPSULE | ORAL | 0 refills | Status: DC
Start: 2020-05-31 — End: 2020-07-21

## 2020-05-31 MED ORDER — THIAMINE HCL 100 MG PO TABS
100.0000 mg | ORAL_TABLET | Freq: Every morning | ORAL | 0 refills | Status: DC
Start: 2020-05-31 — End: 2020-07-21

## 2020-05-31 NOTE — Telephone Encounter (Signed)
Spoke to Pt today she does not want to use the mail order pharmacy  she has already told them .  L. Shahan Starks,LPN

## 2020-05-31 NOTE — Telephone Encounter (Signed)
Pt changing [facility] location  .Requesting refills.  Has and appt with you on 4/13 . I pended RX's for your approval . L., Terree Gaultney, LPN

## 2020-07-21 ENCOUNTER — Ambulatory Visit (INDEPENDENT_AMBULATORY_CARE_PROVIDER_SITE_OTHER): Payer: 59 | Admitting: Family Medicine

## 2020-07-21 ENCOUNTER — Encounter (INDEPENDENT_AMBULATORY_CARE_PROVIDER_SITE_OTHER): Payer: Self-pay | Admitting: Family Medicine

## 2020-07-21 VITALS — BP 128/83 | HR 89 | Temp 97.8°F | Resp 16 | Wt 160.0 lb

## 2020-07-21 DIAGNOSIS — F1020 Alcohol dependence, uncomplicated: Secondary | ICD-10-CM

## 2020-07-21 DIAGNOSIS — I10 Essential (primary) hypertension: Secondary | ICD-10-CM

## 2020-07-21 DIAGNOSIS — E559 Vitamin D deficiency, unspecified: Secondary | ICD-10-CM

## 2020-07-21 DIAGNOSIS — D649 Anemia, unspecified: Secondary | ICD-10-CM

## 2020-07-21 DIAGNOSIS — K8689 Other specified diseases of pancreas: Secondary | ICD-10-CM

## 2020-07-21 DIAGNOSIS — D696 Thrombocytopenia, unspecified: Secondary | ICD-10-CM

## 2020-07-21 DIAGNOSIS — J302 Other seasonal allergic rhinitis: Secondary | ICD-10-CM

## 2020-07-21 DIAGNOSIS — E876 Hypokalemia: Secondary | ICD-10-CM

## 2020-07-21 DIAGNOSIS — E782 Mixed hyperlipidemia: Secondary | ICD-10-CM

## 2020-07-21 DIAGNOSIS — J439 Emphysema, unspecified: Secondary | ICD-10-CM | POA: Insufficient documentation

## 2020-07-21 DIAGNOSIS — K439 Ventral hernia without obstruction or gangrene: Secondary | ICD-10-CM

## 2020-07-21 DIAGNOSIS — Z760 Encounter for issue of repeat prescription: Secondary | ICD-10-CM

## 2020-07-21 DIAGNOSIS — F17210 Nicotine dependence, cigarettes, uncomplicated: Secondary | ICD-10-CM

## 2020-07-21 MED ORDER — CREON 24000-76000 UNITS PO CPEP
72000.0000 [IU] | ORAL_CAPSULE | Freq: Three times a day (TID) | ORAL | 1 refills | Status: DC
Start: 2020-07-21 — End: 2020-09-20

## 2020-07-21 MED ORDER — ONDANSETRON 4 MG PO TBDP
4.0000 mg | ORAL_TABLET | Freq: Three times a day (TID) | ORAL | 1 refills | Status: DC | PRN
Start: 2020-07-21 — End: 2020-11-25

## 2020-07-21 MED ORDER — VITAMIN D3 1.25 MG (50000 UT) PO CAPS
1.0000 | ORAL_CAPSULE | ORAL | 0 refills | Status: DC
Start: 2020-07-21 — End: 2021-02-02

## 2020-07-21 MED ORDER — FERROUS SULFATE 325 (65 FE) MG PO TBEC
325.0000 mg | DELAYED_RELEASE_TABLET | Freq: Two times a day (BID) | ORAL | 0 refills | Status: DC
Start: 2020-07-21 — End: 2020-10-18

## 2020-07-21 MED ORDER — ALBUTEROL SULFATE HFA 108 (90 BASE) MCG/ACT IN AERS
2.0000 | INHALATION_SPRAY | Freq: Four times a day (QID) | RESPIRATORY_TRACT | 5 refills | Status: DC | PRN
Start: 2020-07-21 — End: 2020-11-25

## 2020-07-21 MED ORDER — SPIRONOLACTONE 50 MG PO TABS
50.0000 mg | ORAL_TABLET | Freq: Every morning | ORAL | 1 refills | Status: DC
Start: 2020-07-21 — End: 2021-02-09

## 2020-07-21 MED ORDER — SODIUM BICARBONATE 650 MG PO TABS
650.0000 mg | ORAL_TABLET | Freq: Three times a day (TID) | ORAL | 5 refills | Status: DC
Start: 2020-07-21 — End: 2021-02-09

## 2020-07-21 MED ORDER — POTASSIUM CHLORIDE CRYS ER 20 MEQ PO TBCR
20.0000 meq | EXTENDED_RELEASE_TABLET | Freq: Three times a day (TID) | ORAL | 2 refills | Status: DC
Start: 2020-07-21 — End: 2021-02-09

## 2020-07-21 MED ORDER — PANTOPRAZOLE SODIUM 40 MG PO TBEC
40.0000 mg | DELAYED_RELEASE_TABLET | Freq: Every morning | ORAL | 1 refills | Status: DC
Start: 2020-07-21 — End: 2021-02-09

## 2020-07-21 MED ORDER — CETIRIZINE HCL 10 MG PO TABS
10.0000 mg | ORAL_TABLET | Freq: Every day | ORAL | 3 refills | Status: DC
Start: 2020-07-21 — End: 2021-02-09

## 2020-07-21 MED ORDER — FUROSEMIDE 40 MG PO TABS
40.0000 mg | ORAL_TABLET | Freq: Every day | ORAL | 1 refills | Status: DC | PRN
Start: 2020-07-21 — End: 2021-02-09

## 2020-07-21 MED ORDER — THIAMINE HCL 100 MG PO TABS
100.0000 mg | ORAL_TABLET | Freq: Every morning | ORAL | 3 refills | Status: DC
Start: 2020-07-21 — End: 2021-02-09

## 2020-07-21 MED ORDER — NICOTINE 21 MG/24HR TD PT24
1.0000 | MEDICATED_PATCH | TRANSDERMAL | 2 refills | Status: DC
Start: 2020-07-21 — End: 2020-11-25

## 2020-07-21 NOTE — Progress Notes (Addendum)
Patient consented to receiving treatment in-office amidst current COVID-19 pandemic. Patient (and any support persons present) wore ear loop face masks throughout visit. Provider PPE included surgical mask or N95 w/ face shield or eye protection.                                                                                                                     [facility]  [address]  [place], [place], [address]  [phone]  07/21/2020     Patient:   [name]                                                  CSN:        [id number]                                          DOB:       19-Jul-1960                                                    MRN:        [id number]     Chief Complaint   Patient presents with    Follow-up     2 month     SUBJECTIVE     History was provided by the Patient.    HPI: Patient is a 60 y.o. female who presents today for follow-up and for medication refills.    Patient states that she has been out of all her medications for about a month.   So she is having flareups of her allergies because of this.  Patient reports still living in [place].  She states that she will be staying at the hotel and spending time with her friends for her birthday    Jethro Bolus is buying a house. [place] to [place]    Hernia - patient has incisional hernia that she states previous evaluations by hernia specialist in [place] advised her shouldn't be closed due to possibly of further complications.  Patient is ready to have this repaired since it is getting bigger and is causing pain.     Diarrhea:  10-2 bm a day.  rx immidoun 2 is helpful but sometimes causes constipation . Nausea.   She has a history of cholecystectomy and states she tries to avoid fatty, fried food but her roommates whom she shares cooking with is not as mindful of avoidance of fatty,fried meals.   Needs a refill of Creon for management of pancreatic insufficiency    Alcohol -  Has not had hard liquor  since 10/2019. Still has occasional wine coolers. 2 months ago.    She is going to anger management classes and AA. [facility].       HTN - Patient was given a blood pressure medication last visit but has not been taking it because it makes her dizzy.  She takes sprinolactone daily. She does not check her blood pressure at home.     H/o of asthma   Current tobacco smoker 1/2-1 pack a day; started 60 years old quit for 7 years; restarted.  Patient states that a pack of cigarettes last for 3 days she would like to try nicotine patches to help her quit completely.   States her SOB has been mildly worse recently.    Uses her albuterol inhaler 2x a month.       No LMP recorded. Patient is postmenopausal.    Health maintenance  Declines Covid vaccine    Past Medical History:   Diagnosis Date    Asthma without status asthmaticus     Disorder of liver     multifocal cystic irregularities    DVT (deep venous thrombosis)     Gastroesophageal reflux disease     H/O ETOH abuse     sober 2 years until 03/2014    Hypertension     Pancreatitis      Past Surgical History:   Procedure Laterality Date    CHOLECYSTECTOMY      COLONOSCOPY  12/26/2012    Procedure: COLONOSCOPY;  Surgeon: Gwenith Spitz, MD;  Location: [facility];  Service: Gastroenterology;  Laterality: N/A;    COLONOSCOPY, POLYPECTOMY  12/26/2012    Procedure: COLONOSCOPY, POLYPECTOMY;  Surgeon: Gwenith Spitz, MD;  Location: [facility];  Service: Gastroenterology;  Laterality: N/A;    DEBRIDEMENT & IRRIGATION, WOUND CLOSURE Right 08/29/2018    Procedure: WASHOUT AND CLOSURE OF TRAUMATIC WOUND, RIGHT FOREARM;  Surgeon: Arnoldo Hooker, MD;  Location: [facility];  Service: General;  Laterality: Right;  Right forearm I&D poss. wound closure    ORIF, HUMERUS, DISTAL Left 04/08/2019    Procedure: ORIF, HUMERUS, DISTAL;  Surgeon: Randolm Idol, MD;  Location: [facility];  Service:  Orthopedics;  Laterality: Left;  ORIF LEFT DISTAL HEMERUS    PANCREATECTOMY  01/2011    partial    THORACENTESIS Right 2012    pl effusion     Family History   Adopted: Yes     Social History     Social History Narrative    Not on file     Allergies   Allergen Reactions    Norvasc [Amlodipine] Other (See Comments)     Unsteady on feet    Morphine Hallucinations    Oxycodone Headaches     severe     Outpatient Medications Marked as Taking for the 07/21/20 encounter (Office Visit) with Penny Pia, MD   Medication Sig Dispense Refill    Multiple Vitamins-Minerals (MULTIVITAMIN WITH MINERALS) tablet Take 1 tablet by mouth every morning.         [DISCONTINUED] pancrelipase, Lip-Prot-Amyl, (Creon) 24000-76000 units Cap DR Particles Take 3 capsules (72,000 units of lipase total) by mouth 3 (three) times daily with meals Take 3 capsules (72000 units of lipase) 3 times daily with meals 90 capsule 1    [DISCONTINUED] pantoprazole (PROTONIX) 40 MG tablet Take 40 mg by mouth every morning      [DISCONTINUED] spironolactone (ALDACTONE) 50 MG tablet Take 1 tablet (50 mg total) by mouth  every morning 30 tablet 3       Review of Systems  Review of Systems: Items that patient complains of are in bold.  Items that the patient denies are not bolded.   General: Fever, Chills.   Eyes: Discharge  Ears/Nose/Throat: Earache, Congestion, Sore Throat.   Cardiovascular: Chest Pain, Palpitations, Peripheral Edema.   Respiratory: Cough, Dyspnea.   Gastrointestinal: Nausea, Vomiting, Diarrhea, Constipation, Abdominal Pain, Melena, Hematochezia. hernia  Genitourinary:  Dysuria, Hematuria, Discharge or Incontinence.  Musculoskeletal:  Joint Pain.   Skin: Rash   Neurologic: Weakness.   Heme/Lymphatic: Abnormal Bruising.  Psych: suicidal ideation,?homicidal ideation,?visual or auditory hallucinations, substance use, mood disorder      PHYSICAL EXAM   BP 128/83 (BP Site: Right arm, Patient Position: Sitting, Cuff Size: Large)  Comment: digital monitor   Pulse 89    Temp 97.8 F (36.6 C) (Temporal)    Resp 16    Wt 72.6 kg (160 lb)    SpO2 98%    BMI 26.63 kg/m     Physical Exam    Constitutional: Well developed, well nourished, active, in no apparent distress.  HENT:   Mask worn throughout visit.  Eyes: PERRL. No scleral icterus. No conjunctival injection. EOMI.  Neck: Trachea is midline. No JVD. Normal range of motion. No apparent masses.  Cardiovascular: Regular rhythm, S1 normal and S2 normal.    No murmur heard.  Pulmonary/Chest: Effort normal. Lungs clear to auscultation bilaterally. Prolonged expiratory phase.  Abdominal: Soft, non-tender, non-distended. No masses. RLQ Large incisional hernia present  About 12 cm by 10 cm that was reduced.  Hernia appears to have gotten bigger with time  Genitourinay/Anorectal: Deferred  Musculoskeletal: Normal range of motion. Gait normal. No deformity or apparent injury.   Neurological: Pt is alert. Cranial nerves are grossly intact. Moving all extremities without apparent deficit.   Psychiatric: Affect is appropriate. There is no agitation.   Skin: Skin is warm, dry, well perfused. No rash noted. No cyanosis. No pallor. No apparent wound.      LABS     Lab Results   Component Value Date    CHOL 216 (H) 05/25/2019    HDL 90 05/25/2019    TRIG 742 05/25/2019    NA 136 02/20/2020    K 2.8 (LL) 02/20/2020    BUN 5 (L) 02/20/2020    GLU 116 (H) 02/20/2020    CA 9.3 02/20/2020    AST 75 (H) 02/20/2020    ALT 34 02/20/2020    ALKPHOS 249 (H) 02/20/2020    TSH 1.09 05/23/2019         UPDATED IMMUNIZATIONS     Immunization History   Administered Date(s) Administered    Influenza quad 6 MOS to 64 YRS (Flulaval/Fluarix) 01/29/2019, 02/20/2020    Influenza quadrivalent (IM) PF 3 Yrs & greater 12/26/2017    Pneumococcal 23 valent 04/15/2019    Tdap 02/28/2018    Zoster [facility]) Vaccine Recombinant 02/07/2019         ASSESSMENT and PLAN     1. Seasonal allergies  cetirizine (ZyrTEC) 10 MG tablet     CBC and differential    Comprehensive metabolic panel    TSH, Abn Reflex to Free T4, Serum   2. Vitamin D deficiency  Cholecalciferol (Vitamin D3) 1.25 MG (50000 UT) Cap    CBC and differential    Comprehensive metabolic panel    TSH, Abn Reflex to Free T4, Serum   3. Anemia, unspecified type  ferrous sulfate  325 (65 FE) MG EC tablet    CBC and differential    Comprehensive metabolic panel    TSH, Abn Reflex to Free T4, Serum   4. Hypokalemia  potassium chloride (KLOR-CON) 20 MEQ tablet    CBC and differential    Comprehensive metabolic panel    TSH, Abn Reflex to Free T4, Serum   5. Hypertension, essential- stable  spironolactone (ALDACTONE) 50 MG tablet    CBC and differential    Comprehensive metabolic panel    TSH, Abn Reflex to Free T4, Serum    Urine Microalbumin Random   6. Uncomplicated alcohol dependence- improved  thiamine (B-1) 100 MG tablet    pantoprazole (PROTONIX) 40 MG tablet   7. Thrombocytopenia  CBC and differential    Comprehensive metabolic panel    TSH, Abn Reflex to Free T4, Serum   8. Pancreatic insufficiency  pancrelipase, Lip-Prot-Amyl, (Creon) 24000-76000 units Cap DR Particles    CBC and differential    Comprehensive metabolic panel    TSH, Abn Reflex to Free T4, Serum   9. Mixed hyperlipidemia  CBC and differential    Comprehensive metabolic panel    TSH, Abn Reflex to Free T4, Serum    Lipid panel   10. Nicotine dependence, cigarettes, uncomplicated  nicotine (NICODERM CQ) 21 MG/24HR   11. Medication refill  ondansetron (Zofran ODT) 4 MG disintegrating tablet    albuterol sulfate HFA (PROVENTIL) 108 (90 Base) MCG/ACT inhaler    CBC and differential    Comprehensive metabolic panel    TSH, Abn Reflex to Free T4, Serum   12. Ventral hernia without obstruction or gangrene  Ambulatory referral to General Surgery     Labs were ordered to further assess patient's medical problems as listed above  Check BP and keep a log to bring to next appointment. Continue taking spironolactone. Patient is  advised to monitor her blood pressure regularly at home and to contact the office if is persistently above 140/90  Continue to monitor diarrhea. Patient advised to avoid greasy, fatty food and to keep a food log with diarrhea symptoms.  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 Advised to continue to reduce alcohol intake and abstain from and abstain from alcohol use entirely if possible.     Patient is reminded to get mammogram done to screen her for breast cancer in addition to doing low-dose CT chest to screen for lung cancer and PFTs to further assess emphysema noted on previous imaging  counseled pt on the adverse effects of smoking for about 3 minutes  Contact the office if problems arise  ED precautions were given  General surgery referral was placed for management of abdominal hernia. ED precautions given for hernia incarceration ad strangulation    Follow up In 4 months and PRN.     Will discuss patient try Wellbutrin at her next visit to see if will help her to quit smoking in the future      Arrayah Connors A Bosie Clos, MD    Total time of 35 minutes spent today including reviewing chart prior to the appointment, exam and explanation of exam findings, treatment options and recommendations, and completion of chart notes following the appointment.     This note was completed using dragon medical speech recognition software. Grammatical errors, random word insertions, pronoun errors, incorrect word insertion, misspellings  and incomplete sentences  are occasional consequences of this technology due to software limitations. If there are questions or concerns about the content of this note or information contained within the body of this dictation they should be addressed with the provider for clarification.

## 2020-07-22 ENCOUNTER — Other Ambulatory Visit
Admission: RE | Admit: 2020-07-22 | Discharge: 2020-07-22 | Disposition: A | Discharge status: Home / Self Care | Payer: 59 | Source: Ambulatory Visit | Attending: Family Medicine | Admitting: Family Medicine

## 2020-07-22 ENCOUNTER — Other Ambulatory Visit (INDEPENDENT_AMBULATORY_CARE_PROVIDER_SITE_OTHER): Payer: Self-pay | Admitting: Family Medicine

## 2020-07-22 ENCOUNTER — Other Ambulatory Visit (INDEPENDENT_AMBULATORY_CARE_PROVIDER_SITE_OTHER): Payer: 59

## 2020-07-22 DIAGNOSIS — Z760 Encounter for issue of repeat prescription: Secondary | ICD-10-CM

## 2020-07-22 DIAGNOSIS — D696 Thrombocytopenia, unspecified: Secondary | ICD-10-CM

## 2020-07-22 DIAGNOSIS — K8689 Other specified diseases of pancreas: Secondary | ICD-10-CM

## 2020-07-22 DIAGNOSIS — D649 Anemia, unspecified: Secondary | ICD-10-CM

## 2020-07-22 DIAGNOSIS — E782 Mixed hyperlipidemia: Secondary | ICD-10-CM

## 2020-07-22 DIAGNOSIS — E559 Vitamin D deficiency, unspecified: Secondary | ICD-10-CM

## 2020-07-22 DIAGNOSIS — I10 Essential (primary) hypertension: Secondary | ICD-10-CM

## 2020-07-22 DIAGNOSIS — J302 Other seasonal allergic rhinitis: Secondary | ICD-10-CM

## 2020-07-22 DIAGNOSIS — E876 Hypokalemia: Secondary | ICD-10-CM

## 2020-07-22 LAB — VH LABCORP TESTING

## 2020-07-24 LAB — MICROALBUMIN, RANDOM URINE WITHOUT CREATININE: Microalbumin, UR: 5.5 ug/mL

## 2020-07-24 LAB — CBC AND DIFFERENTIAL
Baso(Absolute): 0.1 10*3/uL (ref 0.0–0.2)
Basos: 1 %
Eos: 2 %
Eosinophils Absolute: 0.1 10*3/uL (ref 0.0–0.4)
Hematocrit: 33.7 % — ABNORMAL LOW (ref 34.0–46.6)
Hemoglobin: 11.2 g/dL (ref 11.1–15.9)
Immature Granulocytes Absolute: 0 10*3/uL (ref 0.0–0.1)
Immature Granulocytes: 0 %
Lymphocytes Absolute: 1.9 10*3/uL (ref 0.7–3.1)
Lymphocytes: 41 %
MCH: 33.6 pg — ABNORMAL HIGH (ref 26.6–33.0)
MCHC: 33.2 g/dL (ref 31.5–35.7)
MCV: 101 fL — ABNORMAL HIGH (ref 79–97)
Monocytes Absolute: 0.5 10*3/uL (ref 0.1–0.9)
Monocytes: 11 %
Neutrophils Absolute: 2.2 10*3/uL (ref 1.4–7.0)
Neutrophils: 45 %
Platelets: 214 10*3/uL (ref 150–450)
RBC: 3.33 x10E6/uL — ABNORMAL LOW (ref 3.77–5.28)
RDW: 14.2 % (ref 11.7–15.4)
WBC: 4.7 10*3/uL (ref 3.4–10.8)

## 2020-07-24 LAB — COMPREHENSIVE METABOLIC PANEL
ALT: 35 IU/L — ABNORMAL HIGH (ref 0–32)
AST (SGOT): 81 IU/L — ABNORMAL HIGH (ref 0–40)
Albumin/Globulin Ratio: 1.2 (ref 1.2–2.2)
Albumin: 3.7 g/dL — ABNORMAL LOW (ref 3.8–4.9)
Alkaline Phosphatase: 345 IU/L — ABNORMAL HIGH (ref 44–121)
BUN / Creatinine Ratio: 5 — ABNORMAL LOW (ref 9–23)
BUN: 4 mg/dL — ABNORMAL LOW (ref 6–24)
Bilirubin, Total: 0.5 mg/dL (ref 0.0–1.2)
CO2: 19 mmol/L — ABNORMAL LOW (ref 20–29)
Calcium: 8.4 mg/dL — ABNORMAL LOW (ref 8.7–10.2)
Chloride: 100 mmol/L (ref 96–106)
Creatinine: 0.83 mg/dL (ref 0.57–1.00)
Globulin, Total: 3.2 g/dL (ref 1.5–4.5)
Glucose: 134 mg/dL — ABNORMAL HIGH (ref 65–99)
Potassium: 3.5 mmol/L (ref 3.5–5.2)
Protein, Total: 6.9 g/dL (ref 6.0–8.5)
Sodium: 139 mmol/L (ref 134–144)
eGFR: 81 mL/min/{1.73_m2} (ref >59)

## 2020-07-24 LAB — LIPID PANEL, WITHOUT TOTAL CHOLESTEROL/HDL RATIO, SERUM
Cholesterol: 165 mg/dL (ref 100–199)
HDL: 69 mg/dL (ref >39)
LDL Chol Calculated (NIH): 22 mg/dL (ref 0–99)
Triglycerides: 568 mg/dL (ref 0–149)
VLDL Calculated: 74 mg/dL — ABNORMAL HIGH (ref 5–40)

## 2020-07-24 LAB — THYROID STIMULATING HORMONE (TSH), REFLEX ON ABNORMAL TO FREE T4, SERUM: TSH: 0.626 u[IU]/mL (ref 0.450–4.500)

## 2020-07-26 ENCOUNTER — Other Ambulatory Visit (INDEPENDENT_AMBULATORY_CARE_PROVIDER_SITE_OTHER): Payer: Self-pay

## 2020-07-26 DIAGNOSIS — I10 Essential (primary) hypertension: Secondary | ICD-10-CM

## 2020-07-26 DIAGNOSIS — E559 Vitamin D deficiency, unspecified: Secondary | ICD-10-CM

## 2020-07-26 DIAGNOSIS — E876 Hypokalemia: Secondary | ICD-10-CM

## 2020-07-26 DIAGNOSIS — E782 Mixed hyperlipidemia: Secondary | ICD-10-CM

## 2020-07-26 DIAGNOSIS — K8689 Other specified diseases of pancreas: Secondary | ICD-10-CM

## 2020-07-26 DIAGNOSIS — D649 Anemia, unspecified: Secondary | ICD-10-CM

## 2020-07-26 NOTE — Progress Notes (Signed)
You have placed lab orders but the lab canceled them as the blood was resulted by [facility]. I have re-ordered them so that I can scan the results into the patients chart, please sign. Thanks (KS)

## 2020-07-28 ENCOUNTER — Telehealth (INDEPENDENT_AMBULATORY_CARE_PROVIDER_SITE_OTHER): Payer: Self-pay

## 2020-07-28 NOTE — Telephone Encounter (Signed)
Pt left message that her tooth is infected . Can't see dentist until 4/27 .  Stated the poison is leaking into her pancreas she needs an antidotic. This is a new dentist and since you just saw her they suggested for her to call you.  Please advise.  Alwyn Pea , LPN

## 2020-07-29 ENCOUNTER — Ambulatory Visit (INDEPENDENT_AMBULATORY_CARE_PROVIDER_SITE_OTHER): Payer: 59 | Admitting: Family Medicine

## 2020-07-29 ENCOUNTER — Encounter (INDEPENDENT_AMBULATORY_CARE_PROVIDER_SITE_OTHER): Payer: Self-pay | Admitting: Family Medicine

## 2020-07-29 VITALS — BP 134/97 | HR 104 | Temp 97.2°F | Resp 20 | Wt 157.0 lb

## 2020-07-29 DIAGNOSIS — R748 Abnormal levels of other serum enzymes: Secondary | ICD-10-CM

## 2020-07-29 DIAGNOSIS — K047 Periapical abscess without sinus: Secondary | ICD-10-CM

## 2020-07-29 DIAGNOSIS — K029 Dental caries, unspecified: Secondary | ICD-10-CM

## 2020-07-29 DIAGNOSIS — E781 Pure hyperglyceridemia: Secondary | ICD-10-CM

## 2020-07-29 DIAGNOSIS — F1029 Alcohol dependence with unspecified alcohol-induced disorder: Secondary | ICD-10-CM

## 2020-07-29 MED ORDER — AMOXICILLIN-POT CLAVULANATE 875-125 MG PO TABS
1.0000 | ORAL_TABLET | Freq: Two times a day (BID) | ORAL | 0 refills | Status: AC
Start: 2020-07-29 — End: 2020-08-08

## 2020-07-29 MED ORDER — IBUPROFEN 800 MG PO TABS
800.0000 mg | ORAL_TABLET | Freq: Three times a day (TID) | ORAL | 1 refills | Status: DC | PRN
Start: 2020-07-29 — End: 2021-02-09

## 2020-07-29 NOTE — Telephone Encounter (Signed)
Ok to [employer] for 9 Am today

## 2020-07-29 NOTE — Progress Notes (Signed)
Patient consented to receiving treatment in-office amidst current COVID-19 pandemic. Patient (and any support persons present) wore ear loop face masks throughout visit. Provider PPE included surgical mask or N95 w/ face shield or eye protection.                                                                                                                     [facility]  [address]  [place], [place], [address]  [phone]  07/29/2020     Patient:   [name]                                                  CSN:        [id number]                                          DOB:       1960-10-04                                                    MRN:        [id number]     Chief Complaint   Patient presents with    Dental Problem     Tooth broke  having  pulled on 4/27  needs antibiotic for infection      SUBJECTIVE     History was provided by the Patient.    HPI: Patient is a 60 y.o. female who presents today because of a health concern      She states that she broke her teeth not long ago.  She has had fairly her teeth many years and the filling fell out which caused her teeth to fall out.  Is having excruciating dental pain which is moving into her left ear.  Patient states that he has an appointment with a dentist for complete dental extractions so she can get implants on April 27.  However the dentist will not prescribe antibiotics since she is never been seen here before.    Patient admits that she drank a lot of alcohol recently with her friends to celebrate her birthday.  This is likely the reason why her liver enzymes were elevated labs on July 22, 2020.  She is working on cutting back normal to help to improve her health.    Patient states that she is working on moving back to [place] from [place]  She denies fever, nausea, vomiting      H/o of asthma   Current tobacco smoker 1/2-1 pack a day; started 61 years old quit for 7 years;  restarted.  Patient  states that a pack of cigarettes last for 3 days she would like to try nicotine patches to help her quit completely.   States her SOB has been mildly worse recently.    Uses her albuterol inhaler 2x a month.       No LMP recorded. Patient is postmenopausal.    Health maintenance  Declines Covid vaccine    Past Medical History:   Diagnosis Date    Asthma without status asthmaticus     Disorder of liver     multifocal cystic irregularities    DVT (deep venous thrombosis)     Gastroesophageal reflux disease     H/O ETOH abuse     sober 2 years until 03/2014    Hypertension     Pancreatitis      Past Surgical History:   Procedure Laterality Date    CHOLECYSTECTOMY      COLONOSCOPY  12/26/2012    Procedure: COLONOSCOPY;  Surgeon: Gwenith Spitz, MD;  Location: [facility];  Service: Gastroenterology;  Laterality: N/A;    COLONOSCOPY, POLYPECTOMY  12/26/2012    Procedure: COLONOSCOPY, POLYPECTOMY;  Surgeon: Gwenith Spitz, MD;  Location: [facility];  Service: Gastroenterology;  Laterality: N/A;    DEBRIDEMENT & IRRIGATION, WOUND CLOSURE Right 08/29/2018    Procedure: WASHOUT AND CLOSURE OF TRAUMATIC WOUND, RIGHT FOREARM;  Surgeon: Arnoldo Hooker, MD;  Location: [facility];  Service: General;  Laterality: Right;  Right forearm I&D poss. wound closure    ORIF, HUMERUS, DISTAL Left 04/08/2019    Procedure: ORIF, HUMERUS, DISTAL;  Surgeon: Randolm Idol, MD;  Location: [facility];  Service: Orthopedics;  Laterality: Left;  ORIF LEFT DISTAL HEMERUS    PANCREATECTOMY  01/2011    partial    THORACENTESIS Right 2012    pl effusion     Family History   Adopted: Yes     Social History     Social History Narrative    Not on file     Allergies   Allergen Reactions    Norvasc [Amlodipine] Other (See Comments)     Unsteady on feet    Morphine Hallucinations    Oxycodone Headaches     severe     Outpatient Medications Marked as Taking for the 07/29/20 encounter (Office Visit) with  Penny Pia, MD   Medication Sig Dispense Refill    albuterol sulfate HFA (PROVENTIL) 108 (90 Base) MCG/ACT inhaler Inhale 2 puffs into the lungs every 6 (six) hours as needed for Wheezing or Shortness of Breath 1 each 5    cetirizine (ZyrTEC) 10 MG tablet Take 1 tablet (10 mg total) by mouth daily 90 tablet 3    Cholecalciferol (Vitamin D3) 1.25 MG (50000 UT) Cap Take 1 capsule by mouth every 14 (fourteen) days 12 capsule 0    ferrous sulfate 325 (65 FE) MG EC tablet Take 1 tablet (325 mg total) by mouth 2 (two) times daily with meals 180 tablet 0    folic acid (FOLVITE) 1 MG tablet Take 1 tablet (1 mg total) by mouth every morning 90 tablet 3    furosemide (LASIX) 40 MG tablet Take 1 tablet (40 mg total) by mouth daily as needed (swelling in feet or weight gain) 90 tablet 1    Multiple Vitamins-Minerals (MULTIVITAMIN WITH MINERALS) tablet Take 1 tablet by mouth every morning.         nicotine (NICODERM CQ) 21 MG/24HR Place 1 patch onto the skin every  24 hours 28 patch 2    ondansetron (Zofran ODT) 4 MG disintegrating tablet Take 1 tablet (4 mg total) by mouth every 8 (eight) hours as needed for Nausea 30 tablet 1    pancrelipase, Lip-Prot-Amyl, (Creon) 24000-76000 units Cap DR Particles Take 3 capsules (72,000 units of lipase total) by mouth 3 (three) times daily with meals Take 3 capsules (72000 units of lipase) 3 times daily with meals 270 capsule 1    pantoprazole (PROTONIX) 40 MG tablet Take 1 tablet (40 mg total) by mouth every morning 90 tablet 1    potassium chloride (KLOR-CON) 20 MEQ tablet Take 1 tablet (20 mEq total) by mouth 3 (three) times daily 90 tablet 2    sodium bicarbonate 650 MG tablet Take 1 tablet (650 mg total) by mouth 3 (three) times daily 90 tablet 5    spironolactone (ALDACTONE) 50 MG tablet Take 1 tablet (50 mg total) by mouth every morning 90 tablet 1    thiamine (B-1) 100 MG tablet Take 1 tablet (100 mg total) by mouth every morning 90 tablet 3       Review of  Systems  Review of Systems: Items that patient complains of are in bold.  Items that the patient denies are not bolded.   General: Fever, Chills.   Eyes: Discharge  Ears/Nose/Throat: Earache, Congestion, Sore Throat.  Dental pain, jaw pain  Cardiovascular: Chest Pain, Palpitations, Peripheral Edema.   Respiratory: Cough, Dyspnea.   Gastrointestinal: Nausea, Vomiting, Diarrhea or excessive alcohol use, Constipation, Abdominal Pain, Melena, Hematochezia. hernia  Genitourinary:  Dysuria, Hematuria, Discharge or Incontinence.  Musculoskeletal:  Joint Pain.   Skin: Rash   Neurologic: Weakness.   Heme/Lymphatic: Abnormal Bruising.  Psych: suicidal ideation,?homicidal ideation,?visual or auditory hallucinations, substance use, mood disorder      PHYSICAL EXAM   BP (!) 134/97 (BP Site: Left arm, Patient Position: Sitting, Cuff Size: Large) Comment: digital monitor   Pulse (!) 104    Temp 97.2 F (36.2 C) (Temporal)    Resp 20    Wt 71.2 kg (157 lb)    SpO2 98%    BMI 26.13 kg/m     Physical Exam    Constitutional: Well developed, well nourished, active, in no apparent distress.  HENT:   Oral cavity: Multiple broken teeth are noted mild erythema is noted by left lower gingiva with mild tenderness to palpation  Ear: Mild tenderness with examination of left ear canal.  Mild erythema by left tympanic membrane  Eyes: PERRL. No scleral icterus. No conjunctival injection. EOMI.  Neck: Trachea is midline. No JVD. Normal range of motion. No apparent masses.  Cardiovascular: Regular rhythm, S1 normal and S2 normal.    No murmur heard.  Pulmonary/Chest: Effort normal. Lungs clear to auscultation bilaterally. Prolonged expiratory phase.  Abdominal: Soft, non-tender, non-distended. No masses.   Genitourinay/Anorectal: Deferred  Musculoskeletal: Normal range of motion. Gait normal. No deformity or apparent injury.   Neurological: Pt is alert. Cranial nerves are grossly intact. Moving all extremities without apparent deficit.    Psychiatric: Affect is appropriate. There is no agitation.   Skin: Skin is warm, dry, well perfused. No rash noted. No cyanosis. No pallor. No apparent wound.      LABS     Lab Results   Component Value Date    CHOL 165 07/22/2020    HDL 69 07/22/2020    TRIG 161 (HH) 07/22/2020    NA 139 07/22/2020    K 3.5 07/22/2020    BUN  4 (L) 07/22/2020    GLU 134 (H) 07/22/2020    CA 8.4 (L) 07/22/2020    AST 81 (H) 07/22/2020    ALT 35 (H) 07/22/2020    ALKPHOS 345 (H) 07/22/2020    TSH 0.626 07/22/2020         UPDATED IMMUNIZATIONS     Immunization History   Administered Date(s) Administered    Influenza quad 6 MOS to 64 YRS (Flulaval/Fluarix) 01/29/2019, 02/20/2020    Influenza quadrivalent (IM) PF 3 Yrs & greater 12/26/2017    Pneumococcal 23 valent 04/15/2019    Tdap 02/28/2018    Zoster [facility]) Vaccine Recombinant 02/07/2019         ASSESSMENT and PLAN     1. Dental infection  amoxicillin-clavulanate (Augmentin) 875-125 MG per tablet    ibuprofen (ADVIL) 800 MG tablet   2. Dental caries with broken tooth  amoxicillin-clavulanate (Augmentin) 875-125 MG per tablet    ibuprofen (ADVIL) 800 MG tablet   3. Alcohol dependence with unspecified alcohol-induced disorder     4. Elevated liver enzymes     5. Hypertriglyceridemia       Keep scheduled appointment with dentist for complete dental extraction as planned contact the office if your symptoms worsen  I advised patient to refrain from taking controlled substances like tramadol due to history of alcohol dependence.  Importance of abstaining from alcohol use was discussed at length with patient since labs done on July 22, 2020 showed elevated liver enzymes.  Patient's excessive alcohol use is likely contributing to her severe hypertriglyceridemia  Precautions were given for worsening symptoms    Keep scheduled appt on 8/18 and PRN    Will discuss patient try Wellbutrin at her next visit to see if will help her to quit smoking in the future      Evalina Tabak A Gionna Polak,  MD    Total time of 35 minutes spent today including reviewing chart prior to the appointment, exam and explanation of exam findings, treatment options and recommendations, and completion of chart notes following the appointment.     This note was completed using dragon medical speech recognition software. Grammatical errors, random word insertions, pronoun errors, incorrect word insertion, misspellings  and incomplete sentences are occasional consequences of this technology due to software limitations. If there are questions or concerns about the content of this note or information contained within the body of this dictation they should be addressed with the provider for clarification.

## 2020-07-29 NOTE — Telephone Encounter (Signed)
Pt scheduled per Whitney  L. Indio Santilli,LPN

## 2020-08-03 ENCOUNTER — Telehealth (INDEPENDENT_AMBULATORY_CARE_PROVIDER_SITE_OTHER): Payer: Self-pay

## 2020-08-03 NOTE — Telephone Encounter (Signed)
Pt left message that her refil for Nicoderm patch was not at pharmacy.      I called pharmacy spoke to Pharmacist . She stated RX is ready to pick up but it is not covered by  [employer].  She can [name] a cash pay savings card.  Cost  Is $42.49.  I left a message for Pt with this information.  L. Eoghan Belcher,LPN

## 2020-09-19 ENCOUNTER — Other Ambulatory Visit (INDEPENDENT_AMBULATORY_CARE_PROVIDER_SITE_OTHER): Payer: Self-pay | Admitting: Family Medicine

## 2020-09-19 DIAGNOSIS — K8689 Other specified diseases of pancreas: Secondary | ICD-10-CM

## 2020-10-16 ENCOUNTER — Other Ambulatory Visit (INDEPENDENT_AMBULATORY_CARE_PROVIDER_SITE_OTHER): Payer: Self-pay | Admitting: Family Medicine

## 2020-10-16 DIAGNOSIS — D649 Anemia, unspecified: Secondary | ICD-10-CM

## 2020-11-09 ENCOUNTER — Emergency Department
Admission: EM | Admit: 2020-11-09 | Discharge: 2020-11-09 | Payer: 59 | Attending: Emergency Medicine | Admitting: Emergency Medicine

## 2020-11-09 DIAGNOSIS — K219 Gastro-esophageal reflux disease without esophagitis: Secondary | ICD-10-CM | POA: Insufficient documentation

## 2020-11-09 DIAGNOSIS — I10 Essential (primary) hypertension: Secondary | ICD-10-CM | POA: Insufficient documentation

## 2020-11-09 DIAGNOSIS — U071 COVID-19: Secondary | ICD-10-CM | POA: Insufficient documentation

## 2020-11-09 LAB — CBC AND DIFFERENTIAL
Basophils %: 0 % (ref 0.0–3.0)
Basophils Absolute: 0 10*3/uL (ref 0.0–0.3)
Eosinophils %: 0.5 % (ref 0.0–7.0)
Eosinophils Absolute: 0.1 10*3/uL (ref 0.0–0.8)
Hematocrit: 39.2 % (ref 36.0–48.0)
Hemoglobin: 12.7 gm/dL (ref 12.0–16.0)
Lymphocytes Absolute: 1.1 10*3/uL (ref 0.6–5.1)
Lymphocytes: 8.5 % — ABNORMAL LOW (ref 15.0–46.0)
MCH: 34 pg (ref 28–35)
MCHC: 32 gm/dL (ref 32–36)
MCV: 104 fL — ABNORMAL HIGH (ref 80–100)
MPV: 7 fL (ref 6.0–10.0)
Monocytes Absolute: 1.1 10*3/uL (ref 0.1–1.7)
Monocytes: 8.6 % (ref 3.0–15.0)
Neutrophils %: 82.4 % — ABNORMAL HIGH (ref 42.0–78.0)
Neutrophils Absolute: 10.5 10*3/uL — ABNORMAL HIGH (ref 1.7–8.6)
PLT CT: 262 10*3/uL (ref 130–440)
RBC: 3.78 10*6/uL — ABNORMAL LOW (ref 3.80–5.00)
RDW: 11.9 % (ref 11.0–14.0)
WBC: 12.7 10*3/uL — ABNORMAL HIGH (ref 4.0–11.0)

## 2020-11-09 LAB — COMPREHENSIVE METABOLIC PANEL
ALT: 28 U/L (ref 0–55)
AST (SGOT): 36 U/L (ref 10–42)
Albumin/Globulin Ratio: 0.75 Ratio — ABNORMAL LOW (ref 0.80–2.00)
Albumin: 3.3 gm/dL — ABNORMAL LOW (ref 3.5–5.0)
Alkaline Phosphatase: 142 U/L (ref 40–145)
Anion Gap: 15 mMol/L (ref 7.0–18.0)
BUN / Creatinine Ratio: 16.8 Ratio (ref 10.0–30.0)
BUN: 17 mg/dL (ref 7–22)
Bilirubin, Total: 1.1 mg/dL (ref 0.1–1.2)
CO2: 20 mMol/L (ref 20–30)
Calcium: 9.5 mg/dL (ref 8.5–10.5)
Chloride: 102 mMol/L (ref 98–110)
Creatinine: 1.01 mg/dL (ref 0.60–1.20)
EGFR: 64 mL/min/{1.73_m2} (ref 60–150)
Globulin: 4.4 gm/dL — ABNORMAL HIGH (ref 2.0–4.0)
Glucose: 116 mg/dL — ABNORMAL HIGH (ref 71–99)
Osmolality Calculated: 267 mOsm/kg — ABNORMAL LOW (ref 275–300)
Potassium: 5 mMol/L (ref 3.5–5.3)
Protein, Total: 7.7 gm/dL (ref 6.0–8.3)
Sodium: 132 mMol/L — ABNORMAL LOW (ref 136–147)

## 2020-11-09 LAB — VH URINALYSIS WITH MICROSCOPIC IF INDICATED
Bilirubin, UA: NEGATIVE
Blood, UA: 0.06 — AB
Glucose, UA: NEGATIVE mg/dL
Hyaline Casts, UA: 3 /lpf — ABNORMAL HIGH (ref 0–0)
Ketones UA: NEGATIVE mg/dL
Leukocyte Esterase, UA: 500 Leu/uL — AB
Nitrite, UA: POSITIVE — AB
Protein, UR: 30 mg/dL — AB
RBC, UA: 21 /hpf — ABNORMAL HIGH (ref 0–5)
Squam Epithel, UA: 82 /lpf — ABNORMAL HIGH (ref 0–2)
Urine Specific Gravity: 1.016
Urobilinogen, UA: NORMAL mg/dL
WBC, UA: 60 /hpf — ABNORMAL HIGH (ref 0–4)
pH, Urine: 6.5 pH (ref 5.0–8.0)

## 2020-11-09 LAB — VH XPERT XPRESS © COV-2/FLU/RSV PLUS
Date of Onset: 20220801
Does patient reside in a congregate care setting?: NEGATIVE
Influenza A RNA: NEGATIVE
Influenza B RNA: NEGATIVE
Is patient employed in a healthcare setting?: NEGATIVE
Is the patient pregnant?: NEGATIVE
RSV RNA: NEGATIVE
SARS-CoV-2 RNA: POSITIVE — CR

## 2020-11-09 LAB — LIPASE: Lipase: 28 U/L (ref 8–78)

## 2020-11-09 MED ORDER — IBUPROFEN 600 MG PO TABS
600.0000 mg | ORAL_TABLET | Freq: Once | ORAL | Status: AC
Start: 2020-11-09 — End: 2020-11-09
  Administered 2020-11-09: 600 mg via ORAL

## 2020-11-09 MED ORDER — IBUPROFEN 600 MG PO TABS
ORAL_TABLET | ORAL | Status: AC
Start: 2020-11-09 — End: ?
  Filled 2020-11-09: qty 1

## 2020-11-09 MED ORDER — NIRMATRELVIR & RITONAVIR 20 X 150 MG & 10 X 100MG PO TBPK
3.0000 | ORAL_TABLET | Freq: Two times a day (BID) | ORAL | 0 refills | Status: AC
Start: 2020-11-09 — End: 2020-11-14

## 2020-11-09 NOTE — ED Provider Notes (Signed)
History     Chief Complaint   Patient presents with    Fever   [facility].  She has felt achy and has some nausea but no vomiting or diarrhea.  Slight congestion but no cough no shortness of breath no sore throat.  No abdominal pain reported.  No dysuria is reported.          Past Medical History:   Diagnosis Date    Asthma without status asthmaticus     Disorder of liver     multifocal cystic irregularities    DVT (deep venous thrombosis)     Gastroesophageal reflux disease     H/O ETOH abuse     sober 2 years until 03/2014    Hypertension     Pancreatitis        Past Surgical History:   Procedure Laterality Date    CHOLECYSTECTOMY      COLONOSCOPY  12/26/2012    Procedure: COLONOSCOPY;  Surgeon: Gwenith Spitz, MD;  Location: [facility];  Service: Gastroenterology;  Laterality: N/A;    COLONOSCOPY, POLYPECTOMY  12/26/2012    Procedure: COLONOSCOPY, POLYPECTOMY;  Surgeon: Gwenith Spitz, MD;  Location: [facility];  Service: Gastroenterology;  Laterality: N/A;    DEBRIDEMENT & IRRIGATION, WOUND CLOSURE Right 08/29/2018    Procedure: WASHOUT AND CLOSURE OF TRAUMATIC WOUND, RIGHT FOREARM;  Surgeon: Arnoldo Hooker, MD;  Location: [facility];  Service: General;  Laterality: Right;  Right forearm I&D poss. wound closure    ORIF, HUMERUS, DISTAL Left 04/08/2019    Procedure: ORIF, HUMERUS, DISTAL;  Surgeon: Randolm Idol, MD;  Location: [facility];  Service: Orthopedics;  Laterality: Left;  ORIF LEFT DISTAL HEMERUS    PANCREATECTOMY  01/2011    partial    THORACENTESIS Right 2012    pl effusion       Family History   Adopted: Yes       Social  Social History     Tobacco Use    Smoking status: Every Day     Packs/day: 0.50     Years: 30.00     Pack years: 15.00     Types: Cigarettes    Smokeless tobacco: Never    Tobacco comments:     1 pack every 3 days   Vaping Use    Vaping Use: Never used   Substance Use Topics    Alcohol  use: Yes     Comment: 1-2 wine coolers a day.     Drug use: Not Currently       .     Allergies   Allergen Reactions    Norvasc [Amlodipine] Other (See Comments)     Unsteady on feet    Morphine Hallucinations    Oxycodone Headaches     severe       Home Medications       Med List Status: In Progress Set By: Gregary Cromer, RN at 11/09/2020  1:03 AM              albuterol sulfate HFA (PROVENTIL) 108 (90 Base) MCG/ACT inhaler     Inhale 2 puffs into the lungs every 6 (six) hours as needed for Wheezing or Shortness of Breath     cetirizine (ZyrTEC) 10 MG tablet     Take 1 tablet (10 mg total) by mouth daily     Cholecalciferol (Vitamin D3) 1.25 MG (50000 UT) Cap  Take 1 capsule by mouth every 14 (fourteen) days     Creon 24000-76000 units Cap DR Particles     TAKE THREE CAPSULES BY MOUTH THREE TIMES DAILY WITH MEALS     ferrous sulfate 325 (65 FE) MG EC tablet     TAKE ONE TABLET BY MOUTH TWICE A DAY WITH MEALS     folic acid (FOLVITE) 1 MG tablet     Take 1 tablet (1 mg total) by mouth every morning     furosemide (LASIX) 40 MG tablet     Take 1 tablet (40 mg total) by mouth daily as needed (swelling in feet or weight gain)     ibuprofen (ADVIL) 800 MG tablet     Take 1 tablet (800 mg total) by mouth every 8 (eight) hours as needed for Pain     Multiple Vitamins-Minerals (MULTIVITAMIN WITH MINERALS) tablet     Take 1 tablet by mouth every morning.        nicotine (NICODERM CQ) 21 MG/24HR     Place 1 patch onto the skin every 24 hours     ondansetron (Zofran ODT) 4 MG disintegrating tablet     Take 1 tablet (4 mg total) by mouth every 8 (eight) hours as needed for Nausea     pantoprazole (PROTONIX) 40 MG tablet     Take 1 tablet (40 mg total) by mouth every morning     potassium chloride (KLOR-CON) 20 MEQ tablet     Take 1 tablet (20 mEq total) by mouth 3 (three) times daily     sodium bicarbonate 650 MG tablet     Take 1 tablet (650 mg total) by mouth 3 (three) times daily     spironolactone (ALDACTONE) 50 MG  tablet     Take 1 tablet (50 mg total) by mouth every morning     thiamine (B-1) 100 MG tablet     Take 1 tablet (100 mg total) by mouth every morning             Review of Systems   Constitutional:  Positive for fever.   Respiratory:  Negative for cough and shortness of breath.    Cardiovascular:  Negative for chest pain.   Gastrointestinal:  Negative for abdominal pain, nausea and vomiting.   Genitourinary:  Negative for dysuria.   Musculoskeletal:  Positive for myalgias.   All other systems reviewed and are negative.    Physical Exam    BP: 108/76, Heart Rate: (!) 123, Temp: 99.6 F (37.6 C), Resp Rate: 18, SpO2: 92 %, Weight: 70.8 kg     Physical Exam  Vitals and nursing note reviewed.   Constitutional:       General: She is in acute distress.      Appearance: Normal appearance. She is not toxic-appearing.   HENT:      Head: Normocephalic and atraumatic.      Right Ear: Tympanic membrane and external ear normal.      Left Ear: Tympanic membrane and external ear normal.      Nose: Nose normal.      Mouth/Throat:      Mouth: Mucous membranes are moist.   Eyes:      Extraocular Movements: Extraocular movements intact.      Pupils: Pupils are equal, round, and reactive to light.   Cardiovascular:      Rate and Rhythm: Normal rate and regular rhythm.   Pulmonary:      Effort: Pulmonary effort is  normal.      Breath sounds: Normal breath sounds.   Abdominal:      Palpations: Abdomen is soft.      Tenderness: There is no abdominal tenderness.   Musculoskeletal:         General: No swelling or tenderness. Normal range of motion.      Cervical back: Normal range of motion and neck supple.   Skin:     General: Skin is warm and dry.      Capillary Refill: Capillary refill takes less than 2 seconds.   Neurological:      General: No focal deficit present.      Mental Status: She is alert and oriented to person, place, and time.   Psychiatric:         Mood and Affect: Mood normal.         Behavior: Behavior normal.      Results       Procedure Component Value Units Date/Time    Respiratory Specimen Xpert Xpress  CoV-2 / Flu / RSV Plus [[id number]]  (Abnormal) Collected: 11/09/20 0135    Specimen: Nasopharyngeal Swab Updated: 11/09/20 0221     Influenza A RNA Negative     Influenza B RNA Negative     RSV RNA Negative     SARS-CoV-2 RNA Positive     Does patient have symptoms related to condition of interest? Y     Date of Onset [id number]     Is patient employed in a healthcare setting? N     Does patient reside in a congregate care setting? N     Is the patient pregnant? N    Narrative:      Specimen source - Nasopharyngeal Swab     Influenza A tests are unable to distinguish between novel and seasonal influenza A.     A negative result for either Influenza A or B does not exclude influenza virus infection. Clinical correlation required.     All positive influenza tests (A or B) require placement of patient on droplet precaution isolation.    Comprehensive metabolic panel [[id number]]  (Abnormal) Collected: 11/09/20 0135    Specimen: Plasma Updated: 11/09/20 0201     Sodium 132 mMol/L      Potassium 5.0 mMol/L      Chloride 102 mMol/L      CO2 20 mMol/L      Calcium 9.5 mg/dL      Glucose 664 mg/dL      Creatinine 4.03 mg/dL      BUN 17 mg/dL      Protein, Total 7.7 gm/dL      Albumin 3.3 gm/dL      Alkaline Phosphatase 142 U/L      ALT 28 U/L      AST (SGOT) 36 U/L      Bilirubin, Total 1.1 mg/dL      Albumin/Globulin Ratio 0.75 Ratio      Anion Gap 15.0 mMol/L      BUN / Creatinine Ratio 16.8 Ratio      EGFR 64 mL/min/1.90m2      Osmolality Calculated 267 mOsm/kg      Globulin 4.4 gm/dL     Lipase [[id number]] Collected: 11/09/20 0135    Specimen: Plasma Updated: 11/09/20 0201     Lipase 28 U/L     CBC and differential [[id number]]  (Abnormal) Collected: 11/09/20 0135    Specimen: Blood Updated: 11/09/20 0153     WBC 12.7 K/cmm  RBC 3.78 M/cmm      Hemoglobin 12.7 gm/dL      Hematocrit 16.1 %      MCV 104 fL      MCH 34 pg       MCHC 32 gm/dL      RDW 09.6 %      PLT CT 262 K/cmm      MPV 7.0 fL      Neutrophils % 82.4 %      Lymphocytes 8.5 %      Monocytes 8.6 %      Eosinophils % 0.5 %      Basophils % 0.0 %      Neutrophils Absolute 10.5 K/cmm      Lymphocytes Absolute 1.1 K/cmm      Monocytes Absolute 1.1 K/cmm      Eosinophils Absolute 0.1 K/cmm      Basophils Absolute 0.0 K/cmm     Urinalysis with Microscopic if Indicated [[id number]]  (Abnormal) Collected: 11/09/20 0135    Specimen: Urine, Random Updated: 11/09/20 0148     Color, UA Yellow     Clarity, UA Cloudy     Urine Specific Gravity 1.016     pH, Urine 6.5 pH      Protein, UR 30 mg/dL      Glucose, UA Negative mg/dL      Ketones UA Negative mg/dL      Bilirubin, UA Negative     Blood, UA 0.06     Nitrite, UA Positive     Urobilinogen, UA Normal mg/dL      Leukocyte Esterase, UA 500 Leu/uL      UR Micro Performed     WBC, UA 60 /hpf      RBC, UA 21 /hpf      Bacteria, UA Rare /hpf      Squam Epithel, UA 82 /lpf      Hyaline Casts, UA 3 /lpf              MDM and ED Course     ED Medication Orders (From admission, onward)      None               MDM  Number of Diagnoses or Management Options  Diagnosis management comments: The patient is COVID-positive, the urine is contaminated with predominant epithelial cells but I sent it for culture.  I am going to send in the paxlovid rx with the patient and the [facility] personnel with her to get filled tomorrow.  She knows what to watch for for return conditions, worsening or persistent shortness of breath       Amount and/or Complexity of Data Reviewed  Clinical lab tests: reviewed    Risk of Complications, Morbidity, and/or Mortality  Presenting problems: high  Management options: high                     Procedures    Clinical Impression & Disposition     Clinical Impression  Final diagnoses:   COVID-19 virus infection        ED Disposition       ED Disposition   Discharge    Condition   --    Date/Time   Tue Nov 09, 2020  2:44 AM    Comment    [place] Patient discharge to home/self care.    Condition at disposition: Stable  New Prescriptions    NIRMATRELVIR & RITONAVIR (PAXLOVID) 150 MG & 100 MG THERAPY PACK (EMERGENCY USE AUTHORIZATION)    Take 3 tablets by mouth 2 (two) times daily for 5 days The dosage for PAXLOVID is 300 mg nirmatrelvir (two 150 mg tablets) with 100 mg ritonavir (one 100 mg tablet) with all three tablets taken together.                   Oretha Ellis, MD  11/09/20 [phone]

## 2020-11-09 NOTE — EDIE (Signed)
COLLECTIVE?NOTIFICATION?11/09/2020 00:52?Lein, Autumn L?MRN: [id number]    Criteria Met      History of Alcohol Use Disorder (12 mo.)    Security and Safety  No Security Events were found.  ED Care Guidelines  There are currently no ED Care Guidelines for this patient. Please check your facility's medical records system.        Prescription Monitoring Program  000??- Narcotic Use Score  000??- Sedative Use Score  000??- Stimulant Use Score  000??- Overdose Risk Score  - All Scores range from 000-999 with 75% of the population scoring < 200 and on 1% scoring above 650  - The last digit of the narcotic, sedative, and stimulant score indicates the number of active prescriptions of that type  - Higher Use scores correlate with increased prescribers, pharmacies, mg equiv, and overlapping prescriptions  - Higher Overdose Risk Scores correlate with increased risk of unintentional overdose death   Concerning or unexpectedly high scores should prompt a review of the PMP record; this does not constitute checking PMP for prescribing purposes.    E.D. Visit Count (12 mo.)  Facility Visits   [facility] 1   Total 1   Note: Visits indicate total known visits.     Recent Emergency Department Visit Summary  Date Facility [facility] Type Diagnoses or Chief Complaint    Nov 09, 2020  [facility]  [place]  Emergency      EMS        Recent Inpatient Visit Summary  No Recent Inpatient Visits were found.  Care Team  Provider Specialty Phone Fax Service Dates   Wayne Sever , MD Family Medicine   Current    Liz Malady Case Manager/Care Coordinator [phone]  Current      Collective Portal  This patient has registered at the [facility] Emergency Department   For more information visit: [email or web address]     PLEASE NOTE:     1.   Any care recommendations and other clinical information are provided as  guidelines or for historical purposes only, and providers should exercise their own clinical judgment when providing care.    2.   You may only use this information for purposes of treatment, payment or health care operations activities, and subject to the limitations of applicable Collective Policies.    3.   You should consult directly with the organization that provided a care guideline or other clinical history with any questions about additional information or accuracy or completeness of information provided.    ? 2022 [place], [employer] - [email or web address]

## 2020-11-11 LAB — VH CULTURE, URINE

## 2020-11-12 ENCOUNTER — Telehealth: Payer: Self-pay | Admitting: Emergency Medicine

## 2020-11-12 NOTE — ED Provider Notes (Signed)
Secretary called the jail but no one answered in the infirmary a message had to be left.  We told him to call us regarding [name] urine culture and the message.     Talmage Coin, MD  11/12/20 [phone]

## 2020-11-12 NOTE — ED Notes (Signed)
0 Result Notes  Component 3 d ago    Isolate 1  Abnormal   Greater than 100,000 CFU/mL   Escherichia coli          Susceptibility     Escherichia coli     BACTERIAL SUSCEPTIBILITY MIC (MCG/ML)     Ampicillin Susceptible     Ampicillin/Sulbactam Susceptible     Cefazolin Susceptible     Ciprofloxacin Susceptible     Gentamicin Susceptible     Levofloxacin Susceptible     Nitrofurantoin Susceptible     Pip/Tazo Susceptible     Tobramycin Susceptible     Trimethoprim/Sulfa Susceptible                     Result Care Coordination            Original urinalysis looks like it may be contaminated.  Patient was not discharged on any antibiotics.  Staff called jail - pt no longer there    11/13/2020- I called - left message  [phone]-    I called Ralene Muskrat patient contact at [phone] left message  Called home number [phone]- left message     Sent a prescription for Keflex 500 mg 1 p.o. 3 times daily for 5 days to the patient's preferred pharmacy [facility] in [place]    [phone]-Serena Wells returned the phone call.  She has the same phone number is I have for this patient.  She reports that a lot of times the patient does not pick up her phone.  She will try to reach out to the patient and have her call    0954-patient called back to the emergency room.  I told her about her urine culture.  Currently she is not having any dysuria.  She requested that the prescription to go to the [facility] in [place] which have already done.  She will pick it up and take it.  She has an appointment with her primary care on the 18th of this month so we will have her urine rechecked at that time.         Manning Charity, MD  11/13/20 [phone]

## 2020-11-13 MED ORDER — CEPHALEXIN 500 MG PO CAPS
500.0000 mg | ORAL_CAPSULE | Freq: Three times a day (TID) | ORAL | 0 refills | Status: AC
Start: 2020-11-13 — End: 2020-11-18

## 2020-11-25 ENCOUNTER — Ambulatory Visit (INDEPENDENT_AMBULATORY_CARE_PROVIDER_SITE_OTHER): Payer: 59 | Admitting: Family Medicine

## 2020-11-25 ENCOUNTER — Encounter (INDEPENDENT_AMBULATORY_CARE_PROVIDER_SITE_OTHER): Payer: Self-pay | Admitting: Family Medicine

## 2020-11-25 VITALS — BP 118/77 | HR 98 | Temp 96.4°F | Resp 16 | Wt 150.0 lb

## 2020-11-25 DIAGNOSIS — J439 Emphysema, unspecified: Secondary | ICD-10-CM

## 2020-11-25 DIAGNOSIS — E871 Hypo-osmolality and hyponatremia: Secondary | ICD-10-CM

## 2020-11-25 DIAGNOSIS — F1021 Alcohol dependence, in remission: Secondary | ICD-10-CM

## 2020-11-25 DIAGNOSIS — R0789 Other chest pain: Secondary | ICD-10-CM

## 2020-11-25 DIAGNOSIS — Z23 Encounter for immunization: Secondary | ICD-10-CM

## 2020-11-25 DIAGNOSIS — R8290 Unspecified abnormal findings in urine: Secondary | ICD-10-CM

## 2020-11-25 DIAGNOSIS — R7309 Other abnormal glucose: Secondary | ICD-10-CM

## 2020-11-25 DIAGNOSIS — W57XXXA Bitten or stung by nonvenomous insect and other nonvenomous arthropods, initial encounter: Secondary | ICD-10-CM

## 2020-11-25 DIAGNOSIS — R21 Rash and other nonspecific skin eruption: Secondary | ICD-10-CM

## 2020-11-25 DIAGNOSIS — M545 Low back pain, unspecified: Secondary | ICD-10-CM

## 2020-11-25 DIAGNOSIS — G8929 Other chronic pain: Secondary | ICD-10-CM

## 2020-11-25 DIAGNOSIS — Z760 Encounter for issue of repeat prescription: Secondary | ICD-10-CM

## 2020-11-25 DIAGNOSIS — U071 COVID-19: Secondary | ICD-10-CM

## 2020-11-25 MED ORDER — ONDANSETRON 4 MG PO TBDP
4.0000 mg | ORAL_TABLET | Freq: Three times a day (TID) | ORAL | 2 refills | Status: DC | PRN
Start: 2020-11-25 — End: 2021-02-09

## 2020-11-25 MED ORDER — CEFDINIR 300 MG PO CAPS
300.0000 mg | ORAL_CAPSULE | Freq: Two times a day (BID) | ORAL | 0 refills | Status: AC
Start: 2020-11-25 — End: 2020-12-05

## 2020-11-25 MED ORDER — ALBUTEROL SULFATE HFA 108 (90 BASE) MCG/ACT IN AERS
2.0000 | INHALATION_SPRAY | Freq: Four times a day (QID) | RESPIRATORY_TRACT | 5 refills | Status: DC | PRN
Start: 2020-11-25 — End: 2021-02-09

## 2020-11-25 NOTE — Progress Notes (Signed)
Patient consented to receiving treatment in-office amidst current COVID-19 pandemic. Patient (and any support persons present) wore ear loop face masks throughout visit. Provider PPE included surgical mask or N95 w/ face shield or eye protection.                                                                                                                     [facility]  [address]  [place], [place], [address]  [phone]  11/25/2020     Patient:   [name]                                                  CSN:        [id number]                                          DOB:       02-06-1961                                                    MRN:        [id number]     Chief Complaint   Patient presents with    Follow-up     4 month     SUBJECTIVE     History was provided by the Patient.    HPI: Patient is a 59 y.o. female who presents today for follow-up and because of a health concern    Patient reports that she contracted COVID about 2 weeks ago.  She went to [facility] emergency room on August 2 because of this.   Patient has some blood tests and urine test done at that time which showed hyponatremia  Urine culture done on November 09, 2020 was positive for a second E. coli UTI which was pansensitive  Patient was discharged home with Keflex and Paxlovid however patient reports that she was incarcerated at the time and did not get  She was in jail any of these medications given to her while she was in jail   Patient did not want to disclose the reason for her incarceration  patient reports that he has been having some chest heaviness associated with dry cough and shortness of breath since her COVID-19 diagnosis  She feels like she is having COPD exacerbation but has not used her albuterol inhaler.  Patient reports worsening low back pain but denies urinary problems problems.  Patient reports that she has been sober from alcohol use for about 7 weeks.    Patient reports that she has had this rash on her  thigh for   years but has noticed that it is slowly changing the past 2 to 3 years      No LMP recorded. Patient is postmenopausal.    Health maintenance  Declines Covid vaccine    Past Medical History:   Diagnosis Date    Asthma without status asthmaticus     Disorder of liver     multifocal cystic irregularities    DVT (deep venous thrombosis)     Gastroesophageal reflux disease     H/O ETOH abuse     sober 2 years until 03/2014    Hypertension     Pancreatitis      Past Surgical History:   Procedure Laterality Date    CHOLECYSTECTOMY      COLONOSCOPY  12/26/2012    Procedure: COLONOSCOPY;  Surgeon: Gwenith Spitz, MD;  Location: [facility];  Service: Gastroenterology;  Laterality: N/A;    COLONOSCOPY, POLYPECTOMY  12/26/2012    Procedure: COLONOSCOPY, POLYPECTOMY;  Surgeon: Gwenith Spitz, MD;  Location: [facility];  Service: Gastroenterology;  Laterality: N/A;    DEBRIDEMENT & IRRIGATION, WOUND CLOSURE Right 08/29/2018    Procedure: WASHOUT AND CLOSURE OF TRAUMATIC WOUND, RIGHT FOREARM;  Surgeon: Arnoldo Hooker, MD;  Location: [facility];  Service: General;  Laterality: Right;  Right forearm I&D poss. wound closure    ORIF, HUMERUS, DISTAL Left 04/08/2019    Procedure: ORIF, HUMERUS, DISTAL;  Surgeon: Randolm Idol, MD;  Location: [facility];  Service: Orthopedics;  Laterality: Left;  ORIF LEFT DISTAL HEMERUS    PANCREATECTOMY  01/2011    partial    THORACENTESIS Right 2012    pl effusion     Family History   Adopted: Yes     Social History     Social History Narrative    Not on file     Allergies   Allergen Reactions    Norvasc [Amlodipine] Other (See Comments)     Unsteady on feet    Morphine Hallucinations    Oxycodone Headaches     severe     Outpatient Medications Marked as Taking for the 11/25/20 encounter (Office Visit) with Penny Pia, MD   Medication Sig Dispense Refill    cetirizine (ZyrTEC) 10 MG tablet Take 1  tablet (10 mg total) by mouth daily 90 tablet 3    Cholecalciferol (Vitamin D3) 1.25 MG (50000 UT) Cap Take 1 capsule by mouth every 14 (fourteen) days 12 capsule 0    Creon 24000-76000 units Cap DR Particles TAKE THREE CAPSULES BY MOUTH THREE TIMES DAILY WITH MEALS 270 capsule 0    ferrous sulfate 325 (65 FE) MG EC tablet TAKE ONE TABLET BY MOUTH TWICE A DAY WITH MEALS 180 tablet 0    folic acid (FOLVITE) 1 MG tablet Take 1 tablet (1 mg total) by mouth every morning 90 tablet 3    furosemide (LASIX) 40 MG tablet Take 1 tablet (40 mg total) by mouth daily as needed (swelling in feet or weight gain) 90 tablet 1    ibuprofen (ADVIL) 800 MG tablet Take 1 tablet (800 mg total) by mouth every 8 (eight) hours as needed for Pain 90 tablet 1    Multiple Vitamins-Minerals (MULTIVITAMIN WITH MINERALS) tablet Take 1 tablet by mouth every morning.         pantoprazole (PROTONIX) 40 MG tablet Take 1 tablet (40 mg total) by mouth every morning 90 tablet 1    potassium chloride (KLOR-CON) 20 MEQ tablet Take  1 tablet (20 mEq total) by mouth 3 (three) times daily 90 tablet 2    sodium bicarbonate 650 MG tablet Take 1 tablet (650 mg total) by mouth 3 (three) times daily 90 tablet 5    spironolactone (ALDACTONE) 50 MG tablet Take 1 tablet (50 mg total) by mouth every morning 90 tablet 1    thiamine (B-1) 100 MG tablet Take 1 tablet (100 mg total) by mouth every morning 90 tablet 3    [DISCONTINUED] albuterol sulfate HFA (PROVENTIL) 108 (90 Base) MCG/ACT inhaler Inhale 2 puffs into the lungs every 6 (six) hours as needed for Wheezing or Shortness of Breath 1 each 5    [DISCONTINUED] ondansetron (Zofran ODT) 4 MG disintegrating tablet Take 1 tablet (4 mg total) by mouth every 8 (eight) hours as needed for Nausea 30 tablet 1       Review of Systems: Items that patient complains of are in bold.  Items that the patient denies are not bolded.   General: Fever, Chills.   Eyes: Discharge  Ears/Nose/Throat: Earache, Congestion, Sore Throat.    Cardiovascular: Chest Pain, Palpitations, Peripheral Edema.   Respiratory: Cough, Dyspnea.   Gastrointestinal: Nausea, Vomiting, Diarrhea, Constipation, Abdominal Pain, Melena, Hematochezia.   Genitourinary:  Dysuria, Hematuria, Discharge or Incontinence.  Musculoskeletal:  Joint Pain.   Skin: Rash   Neurologic: Weakness.   Heme/Lymphatic: Abnormal Bruising.         PHYSICAL EXAM   BP 118/77 (BP Site: Right arm, Patient Position: Sitting, Cuff Size: Medium)    Pulse 98    Temp (!) 96.4 F (35.8 C) (Temporal)    Resp 16    Wt 68 kg (150 lb)    SpO2 98%    BMI 24.96 kg/m     Physical Exam    Constitutional: Well developed, well nourished, active, in no apparent distress.  HENT:       Eyes: PERRL. No scleral icterus. No conjunctival injection. EOMI.  Neck: Trachea is midline. No JVD. Normal range of motion. No apparent masses.  Cardiovascular: Regular rhythm, S1 normal and S2 normal.    No murmur heard.  Pulmonary/Chest: Effort normal. Lungs clear to auscultation bilaterally.   Abdominal: Soft, non-tender, non-distended. No masses.   Genitourinay/Anorectal: Deferred  Musculoskeletal: Normal range of motion. Gait normal. No deformity or apparent injury.   Neurological: Pt is alert. Cranial nerves are grossly intact. Moving all extremities without apparent deficit.   Psychiatric: Affect is appropriate. There is no agitation.   Skin: Skin is warm, dry, well perfused.  5 mm erythematous nodule with ring of erythema surrounding it about 2 cm away was noted on patient's left anterior thigh. no cyanosis. No pallor. No apparent wound.  Few bruises are noted on patient's upper extremities      LABS     Labs were reviewed during visit today   Lab Results   Component Value Date    CHOL 165 07/22/2020    HDL 69 07/22/2020    TRIG 295 (HH) 07/22/2020    NA 132 (L) 11/09/2020    K 5.0 11/09/2020    BUN 17 11/09/2020    GLU 116 (H) 11/09/2020    CA 9.5 11/09/2020    AST 36 11/09/2020    ALT 28 11/09/2020    ALKPHOS 142 11/09/2020     TSH 0.626 07/22/2020         UPDATED IMMUNIZATIONS     Immunization History   Administered Date(s) Administered    Influenza quad 6  MOS to 64 YRS (Flulaval/Fluarix) 01/29/2019, 02/20/2020    Influenza quadrivalent (IM) PF 3 Yrs & greater 12/26/2017    Pneumococcal 23 valent 04/15/2019    Tdap 02/28/2018    Zoster [facility]) Vaccine Recombinant 02/07/2019         ASSESSMENT and PLAN     1. Chest heaviness  cefdinir (OMNICEF) 300 MG capsule    Basic Metabolic Panel      2. COVID-19 virus infection  Basic Metabolic Panel      3. Pulmonary emphysema, unspecified emphysema type  Basic Metabolic Panel    Hemoglobin A1C    albuterol sulfate HFA (PROVENTIL) 108 (90 Base) MCG/ACT inhaler      4. Erythematous rash- with bulls-eye appearance  Basic Metabolic Panel    Hemoglobin A1C    Lyme Ab Tot Rflx to WB IGG/IGM      5. history of Tick bite, unspecified site  Basic Metabolic Panel    Lyme Ab Tot Rflx to WB IGG/IGM      6. worsening Chronic low back pain, unspecified back pain laterality, unspecified whether sciatica present  Basic Metabolic Panel      7. Abnormal urine culture results  Basic Metabolic Panel      8. Alcohol dependence in early remission  Basic Metabolic Panel      9. Hyponatremia  Basic Metabolic Panel      10. Elevated hemoglobin A1c  Basic Metabolic Panel    Hemoglobin A1C      11. Medication refill  ondansetron (Zofran ODT) 4 MG disintegrating tablet    albuterol sulfate HFA (PROVENTIL) 108 (90 Base) MCG/ACT inhaler      12. Immunization due- declines covid vaccine          Previous records were reviewed  Lab were ordered to assess patient's medical problems as listed above  Cefdinir was prescribed for presumed for presumed UTI causing patient's flashes and chills as well as a worsening low back pain  Patient is advised to contact the office if her symptoms worsen  She is congratulated on abstaining from alcohol use and advised to continue to keep this up  Patient is informed that at the rash on her  thigh could be high could be due to bruising.  However testing for Lyme disease was ordered since patient reports having a tick bite in the past  Patient is advised to make an appointment to have the skin lesion removed if it continues to change over time  The importance of recommended vaccinations were discussed with patient     F/u in 4 months and PRN    Will discuss patient try Wellbutrin at her next visit to see if will help her to quit smoking in the future    Will remind patient about low-dose CT chest and mammogram at her next visit    Penny Pia, MD    Total time of 35 minutes spent today including reviewing chart prior to the appointment, exam and explanation of exam findings, treatment options and recommendations, and completion of chart notes following the appointment.     This note was completed using dragon medical speech recognition software. Grammatical errors, random word insertions, pronoun errors, incorrect word insertion, misspellings  and incomplete sentences are occasional consequences of this technology due to software limitations. If there are questions or concerns about the content of this note or information contained within the body of this dictation they should be addressed with the provider for clarification.

## 2020-11-26 ENCOUNTER — Other Ambulatory Visit: Payer: 59

## 2020-12-23 ENCOUNTER — Telehealth (INDEPENDENT_AMBULATORY_CARE_PROVIDER_SITE_OTHER): Payer: Self-pay

## 2020-12-23 NOTE — Telephone Encounter (Signed)
Pt left message that she had labs done 4 wks ago and has not heard back.  The results are in Epic for your review .  Thanks L. Keawe Marcello,LPN

## 2020-12-24 ENCOUNTER — Encounter (INDEPENDENT_AMBULATORY_CARE_PROVIDER_SITE_OTHER): Payer: Self-pay

## 2020-12-24 NOTE — Telephone Encounter (Signed)
Sent patient a my chart message asking where she had labs completed.

## 2020-12-24 NOTE — Telephone Encounter (Signed)
Please inform patient that shows an epic that she did not get the labs done.     Ask her where the labs were drawn.  Please follow-up with the labs for these results

## 2021-02-02 ENCOUNTER — Other Ambulatory Visit (INDEPENDENT_AMBULATORY_CARE_PROVIDER_SITE_OTHER): Payer: Self-pay | Admitting: Family Medicine

## 2021-02-02 DIAGNOSIS — E559 Vitamin D deficiency, unspecified: Secondary | ICD-10-CM

## 2021-02-09 ENCOUNTER — Telehealth (INDEPENDENT_AMBULATORY_CARE_PROVIDER_SITE_OTHER): Payer: Self-pay

## 2021-02-09 ENCOUNTER — Other Ambulatory Visit (INDEPENDENT_AMBULATORY_CARE_PROVIDER_SITE_OTHER): Payer: Self-pay

## 2021-02-09 DIAGNOSIS — F1020 Alcohol dependence, uncomplicated: Secondary | ICD-10-CM

## 2021-02-09 DIAGNOSIS — E876 Hypokalemia: Secondary | ICD-10-CM

## 2021-02-09 DIAGNOSIS — D649 Anemia, unspecified: Secondary | ICD-10-CM

## 2021-02-09 DIAGNOSIS — K029 Dental caries, unspecified: Secondary | ICD-10-CM

## 2021-02-09 DIAGNOSIS — J439 Emphysema, unspecified: Secondary | ICD-10-CM

## 2021-02-09 DIAGNOSIS — K047 Periapical abscess without sinus: Secondary | ICD-10-CM

## 2021-02-09 DIAGNOSIS — E559 Vitamin D deficiency, unspecified: Secondary | ICD-10-CM

## 2021-02-09 DIAGNOSIS — Z760 Encounter for issue of repeat prescription: Secondary | ICD-10-CM

## 2021-02-09 DIAGNOSIS — K8689 Other specified diseases of pancreas: Secondary | ICD-10-CM

## 2021-02-09 DIAGNOSIS — J302 Other seasonal allergic rhinitis: Secondary | ICD-10-CM

## 2021-02-09 DIAGNOSIS — I10 Essential (primary) hypertension: Secondary | ICD-10-CM

## 2021-02-09 MED ORDER — SPIRONOLACTONE 50 MG PO TABS
50.0000 mg | ORAL_TABLET | Freq: Every morning | ORAL | 1 refills | Status: DC
Start: 2021-02-09 — End: 2021-06-30

## 2021-02-09 MED ORDER — CREON 24000-76000 UNITS PO CPEP
ORAL_CAPSULE | ORAL | 1 refills | Status: DC
Start: 2021-02-09 — End: 2021-03-06

## 2021-02-09 MED ORDER — PANTOPRAZOLE SODIUM 40 MG PO TBEC
40.0000 mg | DELAYED_RELEASE_TABLET | Freq: Every morning | ORAL | 1 refills | Status: DC
Start: 2021-02-09 — End: 2021-06-30

## 2021-02-09 MED ORDER — CETIRIZINE HCL 10 MG PO TABS
10.0000 mg | ORAL_TABLET | Freq: Every day | ORAL | 3 refills | Status: DC
Start: 2021-02-09 — End: 2021-11-02

## 2021-02-09 MED ORDER — FUROSEMIDE 40 MG PO TABS
40.0000 mg | ORAL_TABLET | Freq: Every day | ORAL | 1 refills | Status: DC | PRN
Start: 2021-02-09 — End: 2021-06-30

## 2021-02-09 MED ORDER — VITAMIN D3 1.25 MG (50000 UT) PO CAPS
ORAL_CAPSULE | ORAL | 1 refills | Status: DC
Start: 2021-02-09 — End: 2021-11-02

## 2021-02-09 MED ORDER — FOLIC ACID 1 MG PO TABS
1.0000 mg | ORAL_TABLET | Freq: Every day | ORAL | 3 refills | Status: DC
Start: 2021-02-09 — End: 2021-11-02

## 2021-02-09 MED ORDER — ALBUTEROL SULFATE HFA 108 (90 BASE) MCG/ACT IN AERS
2.0000 | INHALATION_SPRAY | Freq: Four times a day (QID) | RESPIRATORY_TRACT | 1 refills | Status: DC | PRN
Start: 2021-02-09 — End: 2021-11-02

## 2021-02-09 MED ORDER — THIAMINE HCL 100 MG PO TABS
100.0000 mg | ORAL_TABLET | Freq: Every morning | ORAL | 1 refills | Status: DC
Start: 2021-02-09 — End: 2021-11-02

## 2021-02-09 MED ORDER — ONDANSETRON 4 MG PO TBDP
4.0000 mg | ORAL_TABLET | Freq: Three times a day (TID) | ORAL | 1 refills | Status: DC | PRN
Start: 2021-02-09 — End: 2021-03-06

## 2021-02-09 MED ORDER — POTASSIUM CHLORIDE CRYS ER 20 MEQ PO TBCR
20.0000 meq | EXTENDED_RELEASE_TABLET | Freq: Three times a day (TID) | ORAL | 2 refills | Status: DC
Start: 2021-02-09 — End: 2021-04-05

## 2021-02-09 MED ORDER — IBUPROFEN 800 MG PO TABS
800.0000 mg | ORAL_TABLET | Freq: Three times a day (TID) | ORAL | 1 refills | Status: DC | PRN
Start: 2021-02-09 — End: 2021-07-13

## 2021-02-09 MED ORDER — MULTI-VITAMIN/MINERALS PO TABS
1.0000 | ORAL_TABLET | Freq: Every morning | ORAL | 1 refills | Status: DC
Start: 2021-02-09 — End: 2021-11-02

## 2021-02-09 MED ORDER — FERROUS SULFATE 325 (65 FE) MG PO TBEC
1.0000 | DELAYED_RELEASE_TABLET | Freq: Two times a day (BID) | ORAL | 1 refills | Status: DC
Start: 2021-02-09 — End: 2021-11-02

## 2021-02-09 MED ORDER — SODIUM BICARBONATE 650 MG PO TABS
650.0000 mg | ORAL_TABLET | Freq: Three times a day (TID) | ORAL | 1 refills | Status: DC
Start: 2021-02-09 — End: 2021-12-29

## 2021-02-09 NOTE — Telephone Encounter (Signed)
Foilc acid need sent . I was not able to pend    Thanks L. Glennys Schorsch,LPN

## 2021-02-09 NOTE — Telephone Encounter (Signed)
Pt changing pharmacies  needs all RX sent. To [facility] . They are pended for your approval.  I was not able to pend Folic Acid  can you do that one?   Thanks L. Braniya Farrugia,LPN

## 2021-03-06 ENCOUNTER — Other Ambulatory Visit (INDEPENDENT_AMBULATORY_CARE_PROVIDER_SITE_OTHER): Payer: Self-pay | Admitting: Family Medicine

## 2021-03-06 DIAGNOSIS — Z760 Encounter for issue of repeat prescription: Secondary | ICD-10-CM

## 2021-03-06 DIAGNOSIS — K8689 Other specified diseases of pancreas: Secondary | ICD-10-CM

## 2021-03-28 ENCOUNTER — Ambulatory Visit (INDEPENDENT_AMBULATORY_CARE_PROVIDER_SITE_OTHER): Payer: 59 | Admitting: Family Medicine

## 2021-04-04 ENCOUNTER — Other Ambulatory Visit (INDEPENDENT_AMBULATORY_CARE_PROVIDER_SITE_OTHER): Payer: Self-pay | Admitting: Family Medicine

## 2021-04-04 DIAGNOSIS — E876 Hypokalemia: Secondary | ICD-10-CM

## 2021-04-12 ENCOUNTER — Encounter (RURAL_HEALTH_CENTER): Payer: Self-pay

## 2021-05-03 ENCOUNTER — Other Ambulatory Visit (INDEPENDENT_AMBULATORY_CARE_PROVIDER_SITE_OTHER): Payer: Self-pay | Admitting: Family Medicine

## 2021-05-03 DIAGNOSIS — Z760 Encounter for issue of repeat prescription: Secondary | ICD-10-CM

## 2021-06-30 ENCOUNTER — Other Ambulatory Visit (INDEPENDENT_AMBULATORY_CARE_PROVIDER_SITE_OTHER): Payer: Self-pay | Admitting: Family Medicine

## 2021-06-30 DIAGNOSIS — F1020 Alcohol dependence, uncomplicated: Secondary | ICD-10-CM

## 2021-06-30 DIAGNOSIS — I10 Essential (primary) hypertension: Secondary | ICD-10-CM

## 2021-07-12 ENCOUNTER — Other Ambulatory Visit (INDEPENDENT_AMBULATORY_CARE_PROVIDER_SITE_OTHER): Payer: Self-pay | Admitting: Family Medicine

## 2021-07-12 DIAGNOSIS — K029 Dental caries, unspecified: Secondary | ICD-10-CM

## 2021-07-12 DIAGNOSIS — K047 Periapical abscess without sinus: Secondary | ICD-10-CM

## 2021-08-02 ENCOUNTER — Other Ambulatory Visit (INDEPENDENT_AMBULATORY_CARE_PROVIDER_SITE_OTHER): Payer: Self-pay | Admitting: Family Medicine

## 2021-08-02 DIAGNOSIS — Z760 Encounter for issue of repeat prescription: Secondary | ICD-10-CM

## 2021-09-29 ENCOUNTER — Other Ambulatory Visit (INDEPENDENT_AMBULATORY_CARE_PROVIDER_SITE_OTHER): Payer: Self-pay | Admitting: Family Medicine

## 2021-09-29 DIAGNOSIS — Z760 Encounter for issue of repeat prescription: Secondary | ICD-10-CM

## 2021-10-25 ENCOUNTER — Telehealth (INDEPENDENT_AMBULATORY_CARE_PROVIDER_SITE_OTHER): Payer: Self-pay | Admitting: Family Medicine

## 2021-10-25 NOTE — Telephone Encounter (Signed)
I received a fax that patient wants to use [facility] has a preferred pharmacy.  Verified this with patient and advised her to make a follow-up appointment since it is almost a year since she was last seen by me.   Please let me know if patient has  another  PCP

## 2021-11-02 ENCOUNTER — Telehealth (RURAL_HEALTH_CENTER): Payer: Self-pay | Admitting: Family Medicine

## 2021-11-02 DIAGNOSIS — K029 Dental caries, unspecified: Secondary | ICD-10-CM

## 2021-11-02 DIAGNOSIS — F1020 Alcohol dependence, uncomplicated: Secondary | ICD-10-CM

## 2021-11-02 DIAGNOSIS — J302 Other seasonal allergic rhinitis: Secondary | ICD-10-CM

## 2021-11-02 DIAGNOSIS — E559 Vitamin D deficiency, unspecified: Secondary | ICD-10-CM

## 2021-11-02 DIAGNOSIS — I10 Essential (primary) hypertension: Secondary | ICD-10-CM

## 2021-11-02 DIAGNOSIS — K047 Periapical abscess without sinus: Secondary | ICD-10-CM

## 2021-11-02 DIAGNOSIS — E876 Hypokalemia: Secondary | ICD-10-CM

## 2021-11-02 DIAGNOSIS — Z760 Encounter for issue of repeat prescription: Secondary | ICD-10-CM

## 2021-11-02 DIAGNOSIS — D649 Anemia, unspecified: Secondary | ICD-10-CM

## 2021-11-02 DIAGNOSIS — K8689 Other specified diseases of pancreas: Secondary | ICD-10-CM

## 2021-11-02 DIAGNOSIS — J439 Emphysema, unspecified: Secondary | ICD-10-CM

## 2021-11-02 MED ORDER — THIAMINE HCL 100 MG PO TABS
100.0000 mg | ORAL_TABLET | Freq: Every morning | ORAL | 1 refills | Status: DC
Start: ? — End: 2021-11-02

## 2021-11-02 MED ORDER — POTASSIUM CHLORIDE CRYS ER 20 MEQ PO TBCR
20.0000 meq | EXTENDED_RELEASE_TABLET | Freq: Three times a day (TID) | ORAL | 3 refills | Status: DC
Start: ? — End: 2021-11-02

## 2021-11-02 MED ORDER — VITAMIN D3 1.25 MG (50000 UT) PO CAPS
ORAL_CAPSULE | ORAL | 1 refills | Status: DC
Start: ? — End: 2021-11-02

## 2021-11-02 MED ORDER — FOLIC ACID 1 MG PO TABS
1.0000 mg | ORAL_TABLET | Freq: Every day | ORAL | 3 refills | Status: DC
Start: ? — End: 2021-11-02

## 2021-11-02 MED ORDER — SPIRONOLACTONE 50 MG PO TABS
50.0000 mg | ORAL_TABLET | Freq: Every morning | ORAL | 3 refills | Status: DC
Start: ? — End: 2021-11-02

## 2021-11-02 MED ORDER — CETIRIZINE HCL 10 MG PO TABS
10.0000 mg | ORAL_TABLET | Freq: Every day | ORAL | 3 refills | Status: DC
Start: ? — End: 2021-11-02

## 2021-11-02 MED ORDER — ALBUTEROL SULFATE HFA 108 (90 BASE) MCG/ACT IN AERS
2.0000 | INHALATION_SPRAY | Freq: Four times a day (QID) | RESPIRATORY_TRACT | 1 refills | Status: DC | PRN
Start: ? — End: 2021-11-02

## 2021-11-02 MED ORDER — CREON 24000-76000 UNITS PO CPEP
ORAL_CAPSULE | ORAL | 3 refills | Status: DC
Start: ? — End: 2021-11-02

## 2021-11-02 MED ORDER — PANTOPRAZOLE SODIUM 40 MG PO TBEC
40.0000 mg | DELAYED_RELEASE_TABLET | Freq: Every morning | ORAL | 3 refills | Status: DC
Start: ? — End: 2021-11-02

## 2021-11-02 MED ORDER — IBUPROFEN 800 MG PO TABS
800.0000 mg | ORAL_TABLET | Freq: Three times a day (TID) | ORAL | 5 refills | Status: DC | PRN
Start: ? — End: 2021-11-02

## 2021-11-02 MED ORDER — FERROUS SULFATE 325 (65 FE) MG PO TBEC
1.0000 | DELAYED_RELEASE_TABLET | Freq: Two times a day (BID) | ORAL | 1 refills | Status: DC
Start: ? — End: 2021-11-02

## 2021-11-02 MED ORDER — MULTI-VITAMIN/MINERALS PO TABS
1.0000 | ORAL_TABLET | Freq: Every morning | ORAL | 1 refills | Status: DC
Start: ? — End: 2021-11-02

## 2021-11-02 MED ORDER — FUROSEMIDE 40 MG PO TABS
ORAL_TABLET | ORAL | 3 refills | Status: DC
Start: ? — End: 2021-11-02

## 2021-11-02 NOTE — Telephone Encounter (Signed)
Refills were sent

## 2021-11-02 NOTE — Telephone Encounter (Signed)
PT CALLED TODAY TO MAKE APPT FOR HER MEDICATION REFILLS, MADE APPT FOR 9/21 HOWEVER PT NEEDS MEDICATIONS REFILLED BEFORE THEN. CAN YOU PLEASE ADVISE PT. THANK YOU

## 2021-11-22 NOTE — Telephone Encounter (Signed)
I received a form from [facility] requesting for patient's medications to be sent to them.  please reach out to patient to see whether she wants to [facility] or [facility]

## 2021-12-21 NOTE — Telephone Encounter (Signed)
Noted.  Will discuss at next appt.  °

## 2021-12-25 ENCOUNTER — Other Ambulatory Visit (INDEPENDENT_AMBULATORY_CARE_PROVIDER_SITE_OTHER): Payer: Self-pay | Admitting: Family Medicine

## 2021-12-25 DIAGNOSIS — Z760 Encounter for issue of repeat prescription: Secondary | ICD-10-CM

## 2021-12-29 ENCOUNTER — Ambulatory Visit (INDEPENDENT_AMBULATORY_CARE_PROVIDER_SITE_OTHER): Payer: 59 | Admitting: Family Medicine

## 2021-12-29 ENCOUNTER — Other Ambulatory Visit (INDEPENDENT_AMBULATORY_CARE_PROVIDER_SITE_OTHER): Payer: Self-pay | Admitting: Family Medicine

## 2021-12-29 ENCOUNTER — Other Ambulatory Visit: Payer: 59

## 2021-12-29 ENCOUNTER — Encounter (INDEPENDENT_AMBULATORY_CARE_PROVIDER_SITE_OTHER): Payer: Self-pay | Admitting: Family Medicine

## 2021-12-29 VITALS — BP 138/80 | HR 86 | Temp 98.1°F | Resp 20 | Wt 148.0 lb

## 2021-12-29 DIAGNOSIS — F1020 Alcohol dependence, uncomplicated: Secondary | ICD-10-CM

## 2021-12-29 DIAGNOSIS — D539 Nutritional anemia, unspecified: Secondary | ICD-10-CM

## 2021-12-29 DIAGNOSIS — Z23 Encounter for immunization: Secondary | ICD-10-CM

## 2021-12-29 DIAGNOSIS — J439 Emphysema, unspecified: Secondary | ICD-10-CM

## 2021-12-29 DIAGNOSIS — K047 Periapical abscess without sinus: Secondary | ICD-10-CM

## 2021-12-29 DIAGNOSIS — Z760 Encounter for issue of repeat prescription: Secondary | ICD-10-CM

## 2021-12-29 DIAGNOSIS — I10 Essential (primary) hypertension: Secondary | ICD-10-CM

## 2021-12-29 DIAGNOSIS — K029 Dental caries, unspecified: Secondary | ICD-10-CM

## 2021-12-29 DIAGNOSIS — Z1231 Encounter for screening mammogram for malignant neoplasm of breast: Secondary | ICD-10-CM

## 2021-12-29 DIAGNOSIS — E871 Hypo-osmolality and hyponatremia: Secondary | ICD-10-CM

## 2021-12-29 DIAGNOSIS — R7309 Other abnormal glucose: Secondary | ICD-10-CM

## 2021-12-29 DIAGNOSIS — E559 Vitamin D deficiency, unspecified: Secondary | ICD-10-CM

## 2021-12-29 DIAGNOSIS — K8689 Other specified diseases of pancreas: Secondary | ICD-10-CM

## 2021-12-29 DIAGNOSIS — E781 Pure hyperglyceridemia: Secondary | ICD-10-CM

## 2021-12-29 DIAGNOSIS — J302 Other seasonal allergic rhinitis: Secondary | ICD-10-CM

## 2021-12-29 DIAGNOSIS — D649 Anemia, unspecified: Secondary | ICD-10-CM

## 2021-12-29 DIAGNOSIS — E876 Hypokalemia: Secondary | ICD-10-CM

## 2021-12-29 DIAGNOSIS — Z7189 Other specified counseling: Secondary | ICD-10-CM

## 2021-12-29 MED ORDER — THIAMINE HCL 100 MG PO TABS
100.0000 mg | ORAL_TABLET | Freq: Every morning | ORAL | 3 refills | Status: AC
Start: 2021-12-29 — End: ?

## 2021-12-29 MED ORDER — ONDANSETRON 4 MG PO TBDP
ORAL_TABLET | ORAL | 1 refills | Status: AC
Start: 2021-12-29 — End: ?

## 2021-12-29 MED ORDER — VITAMIN D3 1.25 MG (50000 UT) PO CAPS
ORAL_CAPSULE | ORAL | 1 refills | Status: AC
Start: 2021-12-29 — End: ?

## 2021-12-29 MED ORDER — SODIUM BICARBONATE 650 MG PO TABS
650.0000 mg | ORAL_TABLET | Freq: Three times a day (TID) | ORAL | 2 refills | Status: AC
Start: 2021-12-29 — End: ?

## 2021-12-29 MED ORDER — PANTOPRAZOLE SODIUM 40 MG PO TBEC
40.0000 mg | DELAYED_RELEASE_TABLET | Freq: Every morning | ORAL | 3 refills | Status: AC
Start: 2021-12-29 — End: ?

## 2021-12-29 MED ORDER — MULTI-VITAMIN/MINERALS PO TABS
1.0000 | ORAL_TABLET | Freq: Every morning | ORAL | 3 refills | Status: AC
Start: 2021-12-29 — End: ?

## 2021-12-29 MED ORDER — POTASSIUM CHLORIDE CRYS ER 20 MEQ PO TBCR
20.0000 meq | EXTENDED_RELEASE_TABLET | Freq: Three times a day (TID) | ORAL | 3 refills | Status: DC
Start: 2021-12-29 — End: 2022-01-06

## 2021-12-29 MED ORDER — IBUPROFEN 800 MG PO TABS
800.0000 mg | ORAL_TABLET | Freq: Three times a day (TID) | ORAL | 5 refills | Status: AC | PRN
Start: 2021-12-29 — End: ?

## 2021-12-29 MED ORDER — ALBUTEROL SULFATE HFA 108 (90 BASE) MCG/ACT IN AERS
2.0000 | INHALATION_SPRAY | Freq: Four times a day (QID) | RESPIRATORY_TRACT | 3 refills | Status: AC | PRN
Start: 2021-12-29 — End: ?

## 2021-12-29 MED ORDER — SPIRONOLACTONE 50 MG PO TABS
50.0000 mg | ORAL_TABLET | Freq: Every morning | ORAL | 3 refills | Status: AC
Start: 2021-12-29 — End: ?

## 2021-12-29 MED ORDER — FOLIC ACID 1 MG PO TABS
1.0000 mg | ORAL_TABLET | Freq: Every day | ORAL | 3 refills | Status: AC
Start: 2021-12-29 — End: 2022-12-29

## 2021-12-29 MED ORDER — CREON 24000-76000 UNITS PO CPEP
ORAL_CAPSULE | ORAL | 3 refills | Status: AC
Start: 2021-12-29 — End: ?

## 2021-12-29 MED ORDER — CETIRIZINE HCL 10 MG PO TABS
10.0000 mg | ORAL_TABLET | Freq: Every day | ORAL | 3 refills | Status: AC
Start: 2021-12-29 — End: ?

## 2021-12-29 MED ORDER — FERROUS SULFATE 325 (65 FE) MG PO TBEC
1.0000 | DELAYED_RELEASE_TABLET | Freq: Two times a day (BID) | ORAL | 1 refills | Status: DC
Start: 2021-12-29 — End: 2022-01-06

## 2021-12-29 NOTE — Progress Notes (Signed)
Patient consented to receiving treatment in-office amidst current COVID-19 pandemic. Patient (and any support persons present) wore ear loop face masks throughout visit. Provider PPE included surgical mask or N95 w/ face shield or eye protection.                                                                                                                     [facility]  [address]  [place], [place], [id number]  [phone]  12/29/2021     Patient:   [name]                                                  CSN:        [id number]                                          DOB:       1961/03/06                                                    MRN:        [id number]     Chief Complaint   Patient presents with    Hospital Follow-up     [facility] ,Med refills      Medication Refill     SUBJECTIVE     History was provided by the Patient.    HPI: Patient is a 61 y.o. female who presents today for follow-up and to discuss recent labs.    Concerns today: Switched medications for prepackages and cannot get vitamins filled. She is requesting [facility] of all [facility]    EtOH: Patient was admitted to the hospital in January for ammonia poisoning and was discharged to rehabilitation for EtOH. She was released from hospital in June. She feels much better since stopping drinking.    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 Sometimes checks at home and is not outrageously high per patient.     COPD and asthma: Breathing is overall stable. She reports intermittent dyspnea which gets worse with winter seasons due to environmental changes. She has noticed flares with current season change but sxs are relieved with inhaler. She uses inhaler once or twice a week. It is somewhat bothersome. Denies  night-time cough.       No LMP recorded. Patient is postmenopausal.    Health maintenance  Declines Covid vaccine  Living in [facility], safe and comfortable environment  Mammogram: never done, pt willing for referral  Shingles is UTD  Attends AA meetings in her free time. Has not yet found a sponsor since there are not many females attending her current group    Past Medical History:   Diagnosis Date    Asthma without status asthmaticus     Disorder of liver     multifocal cystic irregularities    DVT (deep venous thrombosis)     Gastroesophageal reflux disease     H/O ETOH abuse     sober 2 years until 03/2014    Hypertension     Pancreatitis      Past Surgical History:   Procedure Laterality Date    CHOLECYSTECTOMY      COLONOSCOPY  12/26/2012    Procedure: COLONOSCOPY;  Surgeon: Gwenith Spitz, MD;  Location: [facility];  Service: Gastroenterology;  Laterality: N/A;    COLONOSCOPY, POLYPECTOMY  12/26/2012    Procedure: COLONOSCOPY, POLYPECTOMY;  Surgeon: Gwenith Spitz, MD;  Location: [facility];  Service: Gastroenterology;  Laterality: N/A;    DEBRIDEMENT & IRRIGATION, WOUND CLOSURE Right 08/29/2018    Procedure: WASHOUT AND CLOSURE OF TRAUMATIC WOUND, RIGHT FOREARM;  Surgeon: Arnoldo Hooker, MD;  Location: [facility];  Service: General;  Laterality: Right;  Right forearm I&D poss. wound closure    ORIF, HUMERUS, DISTAL Left 04/08/2019    Procedure: ORIF, HUMERUS, DISTAL;  Surgeon: Randolm Idol, MD;  Location: [facility];  Service: Orthopedics;  Laterality: Left;  ORIF LEFT DISTAL HEMERUS    PANCREATECTOMY  01/2011    partial    THORACENTESIS Right 2012    pl effusion     Family History   Adopted: Yes     Social History     Social History Narrative    Not on file     Allergies   Allergen Reactions    Norvasc [Amlodipine] Other (See Comments)     Unsteady on feet    Morphine Hallucinations    Oxycodone Headaches     severe     Outpatient Medications Marked as Taking for the  12/29/21 encounter (Office Visit) with Penny Pia, MD   Medication Sig Dispense Refill    furosemide (LASIX) 40 MG tablet TAKE 1 TABLET BY MOUTH DAILY AS NEEDED FOR SWELLING IN FEET OR WEIGHT GAIN 30 tablet 3    [DISCONTINUED] albuterol sulfate HFA (PROVENTIL) 108 (90 Base) MCG/ACT inhaler Inhale 2 puffs into the lungs every 6 (six) hours as needed for Wheezing or Shortness of Breath 3 each 1    [DISCONTINUED] cetirizine (ZyrTEC) 10 MG tablet Take 1 tablet (10 mg) by mouth daily 90 tablet 3    [DISCONTINUED] Cholecalciferol (Vitamin D3) 1.25 MG (50000 UT) Cap TAKE 1 CAPSULE BY MOUTH EVERY 14 DAYS 12 capsule 1    [DISCONTINUED] ferrous sulfate 325 (65 FE) MG EC tablet Take 1 tablet (325 mg) by mouth 2 (two) times daily with meals 180 tablet 1    [DISCONTINUED] folic acid (FOLVITE) 1 MG tablet Take 1 tablet (1 mg) by mouth daily 90 tablet 3    [DISCONTINUED] ibuprofen (ADVIL) 800 MG tablet Take 1 tablet (800 mg) by mouth every 8 (eight) hours as needed for Pain 90 tablet 5    [DISCONTINUED] Multiple Vitamins-Minerals (multivitamin with minerals) tablet Take 1 tablet by mouth every morning 90 tablet 1    [DISCONTINUED] ondansetron (ZOFRAN-ODT) 4 MG disintegrating tablet DISSOLVE 1 TABLET BY  MOUTH EVERY 8 HOURS AS NEEDED FOR NAUSEA 90 tablet 1    [DISCONTINUED] pancrelipase, Lip-Prot-Amyl, (Creon) 24000-76000 units Cap DR Particles TAKE (3) CAPSULES BY MOUTH THREE TIMES A DAY WITH MEALS 270 capsule 3    [DISCONTINUED] pantoprazole (PROTONIX) 40 MG tablet Take 1 tablet (40 mg) by mouth every morning 30 tablet 3    [DISCONTINUED] potassium chloride (KLOR-CON M20) 20 MEQ CR tablet Take 1 tablet (20 mEq) by mouth 3 (three) times daily 90 tablet 3    [DISCONTINUED] sodium bicarbonate 650 MG tablet Take 1 tablet (650 mg total) by mouth 3 (three) times daily 270 tablet 1    [DISCONTINUED] spironolactone (ALDACTONE) 50 MG tablet Take 1 tablet (50 mg) by mouth every morning 30 tablet 3    [DISCONTINUED] thiamine (B-1) 100  MG tablet Take 1 tablet (100 mg) by mouth every morning 90 tablet 1       Review of Systems: Items that patient complains of are in bold.  Items that the patient denies are not bolded.   General: Fever, Chills.   Eyes: Discharge  Ears/Nose/Throat: Earache, Congestion, Sore Throat.   Cardiovascular: Chest Pain, Palpitations, Peripheral Edema.   Respiratory: Cough, Dyspnea.   Gastrointestinal: Nausea, Vomiting, Diarrhea, Constipation, Abdominal Pain, Melena, Hematochezia.   Genitourinary:  Dysuria, Hematuria, Discharge or Incontinence.  Musculoskeletal:  Joint Pain.   Skin: Rash   Neurologic: Weakness.   Heme/Lymphatic: Abnormal Bruising.         PHYSICAL EXAM     Vitals:    12/29/21 0954 12/29/21 1026   BP: 135/90 138/80   BP Site: Left arm    Patient Position: Sitting    Cuff Size: Large    Pulse: 86    Resp: 20    Temp: 98.1 F (36.7 C)    TempSrc: Oral    SpO2: 98%    Weight: 67.1 kg (148 lb)         Physical Exam    Constitutional: Well developed, well nourished, active, in no apparent distress.  HENT:   Head: Normocephalic, atraumatic  Eyes: PERRL. No scleral icterus. No conjunctival injection. EOMI.  Neck: Trachea is midline. No JVD. Normal range of motion. No apparent masses.  Cardiovascular: Regular rhythm, S1 normal and S2 normal.  No murmur heard.  Pulmonary/Chest: Effort normal. Lungs clear to auscultation bilaterally.   Abdominal: Soft, non-tender, non-distended. No masses.   Genitourinay/Anorectal: Deferred  Musculoskeletal: Normal range of motion. Gait normal. No or apparent injury.   Neurological: Pt is alert. Cranial nerves are grossly intact. Moving all extremities without apparent deficit.   Psychiatric: Affect is appropriate. There is no agitation.   Skin: Skin is warm, dry, well perfused.  10 cm well-healed surgical scar present on patient's anterior L upper arm. no cyanosis. No pallor. No apparent wound.  Few bruises are noted on patient's upper extremities      LABS     Labs were reviewed  during visit today.  Lab Results   Component Value Date    CHOL 165 07/22/2020    HDL 69 07/22/2020    TRIG 950 (HH) 07/22/2020    NA 132 (L) 11/09/2020    K 5.0 11/09/2020    BUN 17 11/09/2020    GLU 116 (H) 11/09/2020    CA 9.5 11/09/2020    AST 36 11/09/2020    ALT 28 11/09/2020    ALKPHOS 142 11/09/2020    TSH 0.626 07/22/2020         UPDATED IMMUNIZATIONS  Immunization History   Administered Date(s) Administered    Influenza quad 6 MOS to 64 YRS (Flulaval/Fluarix) 01/29/2019, 02/20/2020, 12/29/2021    Influenza quadrivalent (IM) PF 3 Yrs & greater 12/26/2017    Pneumococcal 23 valent 04/15/2019    Tdap 02/28/2018    Zoster [facility]) Vaccine Recombinant 02/07/2019, 05/07/2019         ASSESSMENT and PLAN     1. Medication refill  albuterol sulfate HFA (PROVENTIL) 108 (90 Base) MCG/ACT inhaler    ondansetron (ZOFRAN-ODT) 4 MG disintegrating tablet    CBC and differential    Comprehensive metabolic panel      2. Hypertension, essential- stable  spironolactone (ALDACTONE) 50 MG tablet    Urine Microalbumin Random    CBC and differential      3. Uncomplicated alcohol dependence- improved  pantoprazole (PROTONIX) 40 MG tablet    thiamine (B-1) 100 MG tablet    TSH, Abn Reflex to Free T4, Serum    CBC and differential      4. Alcohol use disorder, severe, dependence  folic acid (FOLVITE) 1 MG tablet    Comprehensive metabolic panel    Ferritin    IRON PROFILE    CBC and differential      5. Macrocytic anemia  Vitamin B12 And Folate    CBC and differential      6. Hyponatremia  CBC and differential      7. Hypocalcemia  CBC and differential      8. Elevated glucose level  Hemoglobin A1C    CBC and differential      9. Advanced directives, counseling/discussion  CBC and differential      10. Breast cancer screening by mammogram  Mammo Screening 3D/Tomo Bilateral    CBC and differential      11. Pulmonary emphysema, unspecified emphysema type  albuterol sulfate HFA (PROVENTIL) 108 (90 Base) MCG/ACT inhaler    CBC and  differential      12. Seasonal allergies  cetirizine (ZyrTEC) 10 MG tablet    CBC and differential      13. Vitamin D deficiency  Cholecalciferol (Vitamin D3) 1.25 MG (50000 UT) Cap    CBC and differential      14. Anemia, unspecified type  ferrous sulfate 325 (65 FE) MG EC tablet    CBC and differential      15. Dental caries with broken tooth  ibuprofen (ADVIL) 800 MG tablet    CBC and differential      16. Dental infection  ibuprofen (ADVIL) 800 MG tablet    CBC and differential      17. Pancreatic insufficiency  pancrelipase, Lip-Prot-Amyl, (Creon) 24000-76000 units Cap DR Particles    CBC and differential      18. Hypokalemia  potassium chloride (KLOR-CON M20) 20 MEQ CR tablet    CBC and differential      19. Hypertriglyceridemia  Lipid panel    TSH, Abn Reflex to Free T4, Serum    CBC and differential      20. Immunization due  Flu vaccine QUADRIVALENT (PF) 6 months and older (FLULAVAL/FLUARIX)    CBC and differential          Previous records were reviewed: Labs from [facility] performed twere reviewed with patient and significant for macrocytic anemia, hyponatremia, hypocalcemia  Lab were ordered to assess patient's medical problems as listed above. Will check vitamin b12 and folate levels as per hematologist recommendations with macrocytic anemia   She is congratulated on abstaining from alcohol  use and advised to continue to keep this up. We will keep close follow-up to support pt's sobriety. She is encouraged to continue AA meetings   The importance of recommended vaccinations were discussed with patient  Contact our office as needed for new or worsening concerns  Continue current regimen for asthma and HTN since stable. Pt is encouraged to check blood pressure at home and contact our office if readings are persistently over 140/90  Follow-up with ophthalmology and dentist as previously scheduled for procedures in October     F/u in December for physical and sooner PRN    Will discuss patient try Wellbutrin at  her next visit to see if will help her to quit smoking in the future    Will remind patient about low-dose CT chest and mammogram at her next visit    Will give patient 5 wishes at next visit    Will consider discussing naltrexone with patient to help with alcohol use disorder next visit    Penny Pia, MD    The patient verbally consents to portions of the visit being conducted by PA student under direct supervision from provider.     This note was completed using dragon medical speech recognition software. Grammatical errors, random word insertions, pronoun errors, incorrect word insertion, misspellings  and incomplete sentences are occasional consequences of this technology due to software limitations. If there are questions or concerns about the content of this note or information contained within the body of this dictation they should be addressed with the provider for clarification.

## 2022-01-02 ENCOUNTER — Telehealth (INDEPENDENT_AMBULATORY_CARE_PROVIDER_SITE_OTHER): Payer: Self-pay | Admitting: Family Medicine

## 2022-01-02 NOTE — Telephone Encounter (Signed)
French Ana from [employer] [employer] called to report they received a urine collection in a gray top with out orders attached and wanted to know if a urine cx was to be added. Returned Tracy's call and verified order that was entered by PCP.

## 2022-01-02 NOTE — Telephone Encounter (Signed)
noted 

## 2022-01-06 ENCOUNTER — Other Ambulatory Visit (INDEPENDENT_AMBULATORY_CARE_PROVIDER_SITE_OTHER): Payer: Self-pay | Admitting: Family Medicine

## 2022-01-06 ENCOUNTER — Telehealth (INDEPENDENT_AMBULATORY_CARE_PROVIDER_SITE_OTHER): Payer: Self-pay

## 2022-01-06 DIAGNOSIS — R748 Abnormal levels of other serum enzymes: Secondary | ICD-10-CM

## 2022-01-06 DIAGNOSIS — D649 Anemia, unspecified: Secondary | ICD-10-CM

## 2022-01-06 DIAGNOSIS — E875 Hyperkalemia: Secondary | ICD-10-CM

## 2022-01-06 DIAGNOSIS — E119 Type 2 diabetes mellitus without complications: Secondary | ICD-10-CM

## 2022-01-06 DIAGNOSIS — D539 Nutritional anemia, unspecified: Secondary | ICD-10-CM

## 2022-01-06 DIAGNOSIS — E876 Hypokalemia: Secondary | ICD-10-CM

## 2022-01-06 DIAGNOSIS — R7989 Other specified abnormal findings of blood chemistry: Secondary | ICD-10-CM

## 2022-01-06 MED ORDER — POTASSIUM CHLORIDE CRYS ER 20 MEQ PO TBCR
20.0000 meq | EXTENDED_RELEASE_TABLET | Freq: Every day | ORAL | 3 refills | Status: AC
Start: 2022-01-06 — End: ?

## 2022-01-06 MED ORDER — BLOOD GLUC METER DISP-STRIPS DEVI
6 refills | Status: AC
Start: 2022-01-06 — End: ?

## 2022-01-06 MED ORDER — LANCETS MISC
6 refills | Status: AC
Start: 2022-01-06 — End: ?

## 2022-01-06 MED ORDER — BLOOD GLUCOSE MONITOR SYSTEM W/DEVICE KIT
PACK | 0 refills | Status: AC
Start: 2022-01-06 — End: ?

## 2022-01-06 MED ORDER — METFORMIN HCL ER 500 MG PO TB24
ORAL_TABLET | ORAL | 3 refills | Status: AC
Start: 2022-01-06 — End: ?

## 2022-01-06 NOTE — Telephone Encounter (Signed)
Attempted to call pt to go over lab results, had to leave a vm for her to call back  Agibson,ma

## 2022-01-06 NOTE — Telephone Encounter (Signed)
-----   Message from Penny Pia, MD sent at 01/06/2022 12:33 AM EDT -----  Please advise pt to stop her iron supplements ASAP since her iron levels are too high. Also her potasium level was a little higher than normal. She should decrease intake or potassium supplements from three times a day to once a day to help with this. Her kidney function was reduced.    I have placed a lab to recheck her electrolytes including potassium, kidney liver in a week after reducing her potassium levels. She should make an appt with any of the [facility] ( including [place]) to get this lab repeated    Labs also show that she is now a diabetic with her A1c being 7.3. I have sent a prescription for glucose monitoring supplies and for metformin to treat this. She should let me know if she has any problems with the medications

## 2022-01-06 NOTE — Telephone Encounter (Signed)
Can you please f/u with pt. I tried calling her with no call back

## 2022-01-09 ENCOUNTER — Ambulatory Visit: Payer: 59

## 2022-01-11 ENCOUNTER — Telehealth (INDEPENDENT_AMBULATORY_CARE_PROVIDER_SITE_OTHER): Payer: Self-pay | Admitting: Family Medicine

## 2022-01-11 DIAGNOSIS — E119 Type 2 diabetes mellitus without complications: Secondary | ICD-10-CM

## 2022-01-11 NOTE — Telephone Encounter (Signed)
Patient called into office stating she got all of her diabetic testing supplies except for her testing strips there was no prescription sent for those. She needs to have [facility] in [address] on [address].

## 2022-01-13 NOTE — Telephone Encounter (Signed)
[name] can you please sign this encounter. Thank you

## 2022-01-16 ENCOUNTER — Ambulatory Visit
Admission: RE | Admit: 2022-01-16 | Discharge: 2022-01-16 | Disposition: A | Discharge status: Home / Self Care | Payer: 59 | Source: Ambulatory Visit | Attending: Family Medicine | Admitting: Family Medicine

## 2022-01-16 ENCOUNTER — Other Ambulatory Visit (INDEPENDENT_AMBULATORY_CARE_PROVIDER_SITE_OTHER): Payer: Self-pay | Admitting: Family Medicine

## 2022-01-16 DIAGNOSIS — Z1231 Encounter for screening mammogram for malignant neoplasm of breast: Secondary | ICD-10-CM | POA: Insufficient documentation

## 2022-01-16 DIAGNOSIS — N6315 Unspecified lump in the right breast, overlapping quadrants: Secondary | ICD-10-CM | POA: Insufficient documentation

## 2022-01-16 DIAGNOSIS — N6310 Unspecified lump in the right breast, unspecified quadrant: Secondary | ICD-10-CM

## 2022-01-20 NOTE — Telephone Encounter (Signed)
Patient called in and stated that she got her test strips but the lancets that they gave her don't fit the machine. One Touch Verio Reflect is what she has. Can we please resend new prescription for the correct supplies.

## 2022-01-22 MED ORDER — GLUCOSE BLOOD VI STRP
ORAL_STRIP | 11 refills | Status: AC
Start: 2022-01-22 — End: ?

## 2022-01-22 NOTE — Telephone Encounter (Signed)
Inform pt that the prescription for the requested lancets were placed

## 2022-01-22 NOTE — Addendum Note (Signed)
Addended byBosie Clos, Beyonca Wisz A on: 01/22/2022 03:47 PM     Modules accepted: Orders

## 2022-01-25 NOTE — Telephone Encounter (Signed)
I called and let Pt know  that the Rx was sent in.

## 2022-02-09 ENCOUNTER — Ambulatory Visit
Admission: RE | Admit: 2022-02-09 | Discharge: 2022-02-09 | Disposition: A | Discharge status: Home / Self Care | Payer: 59 | Source: Ambulatory Visit | Attending: Family Medicine | Admitting: Family Medicine

## 2022-02-09 ENCOUNTER — Other Ambulatory Visit (INDEPENDENT_AMBULATORY_CARE_PROVIDER_SITE_OTHER): Payer: Self-pay | Admitting: Family Medicine

## 2022-02-09 ENCOUNTER — Telehealth (INDEPENDENT_AMBULATORY_CARE_PROVIDER_SITE_OTHER): Payer: Self-pay

## 2022-02-09 ENCOUNTER — Other Ambulatory Visit: Payer: 59

## 2022-02-09 ENCOUNTER — Other Ambulatory Visit
Admission: RE | Admit: 2022-02-09 | Discharge: 2022-02-09 | Disposition: A | Discharge status: Home / Self Care | Payer: 59 | Source: Ambulatory Visit

## 2022-02-09 DIAGNOSIS — N6310 Unspecified lump in the right breast, unspecified quadrant: Secondary | ICD-10-CM | POA: Insufficient documentation

## 2022-02-09 LAB — VH LABCORP TESTING

## 2022-02-09 NOTE — Telephone Encounter (Signed)
Patient called in today and stated she still doesn't have the lancets and she has has this machine for a month and hasn't been able to test her blood. She is set to have blood work done today. She is requesting a call back to figure this whole thing out so she can start testing. Her best call back number is [phone].

## 2022-02-09 NOTE — Telephone Encounter (Signed)
I spoke to Pt  today.  She stated the lancets that were sent to pharmacy were not the  correct ones  .  They will not fit in the lancet device.  She has a  one touch Delcia Plus monitor.      [facility]  spoke to [facility]ist  Darlene.. I told her  our records show a generic lancet was sent.  I ask if  Pt can bring in lancet device so they can match  what lancet goes with it . She said  she would be there tomorrow to have her come in .      I called Pt, advised her of this.  She agrees  and will go tomorrow,  She will call us if any problem     DR. Agyeiwaah   FYI

## 2022-02-10 ENCOUNTER — Other Ambulatory Visit (INDEPENDENT_AMBULATORY_CARE_PROVIDER_SITE_OTHER): Payer: Self-pay | Admitting: Family Medicine

## 2022-02-10 DIAGNOSIS — E876 Hypokalemia: Secondary | ICD-10-CM

## 2022-02-10 DIAGNOSIS — E781 Pure hyperglyceridemia: Secondary | ICD-10-CM

## 2022-02-10 DIAGNOSIS — R748 Abnormal levels of other serum enzymes: Secondary | ICD-10-CM

## 2022-02-10 DIAGNOSIS — R7989 Other specified abnormal findings of blood chemistry: Secondary | ICD-10-CM

## 2022-02-10 DIAGNOSIS — E119 Type 2 diabetes mellitus without complications: Secondary | ICD-10-CM

## 2022-02-10 DIAGNOSIS — E875 Hyperkalemia: Secondary | ICD-10-CM

## 2022-02-10 DIAGNOSIS — D539 Nutritional anemia, unspecified: Secondary | ICD-10-CM

## 2022-02-10 DIAGNOSIS — F1020 Alcohol dependence, uncomplicated: Secondary | ICD-10-CM

## 2022-02-10 DIAGNOSIS — D649 Anemia, unspecified: Secondary | ICD-10-CM

## 2022-02-10 LAB — CBC AND DIFFERENTIAL
Baso(Absolute): 0.1 10*3/uL (ref 0.0–0.2)
Basophils Automated: 1 %
Eosinophils Absolute: 0.2 10*3/uL (ref 0.0–0.4)
Eosinophils Automated: 3 %
Hematocrit: 38.7 % (ref 34.0–46.6)
Hemoglobin: 13.3 g/dL (ref 11.1–15.9)
Immature Granulocytes Absolute: 0 10*3/uL (ref 0.0–0.1)
Immature Granulocytes: 1 %
Lymphocytes Absolute: 3.2 10*3/uL — ABNORMAL HIGH (ref 0.7–3.1)
Lymphocytes Automated: 56 %
MCH: 34 pg — ABNORMAL HIGH (ref 26.6–33.0)
MCHC: 34.4 g/dL (ref 31.5–35.7)
MCV: 99 fL — ABNORMAL HIGH (ref 79–97)
Monocytes Absolute: 0.3 10*3/uL (ref 0.1–0.9)
Monocytes: 6 %
Neutrophils Absolute Count: 1.9 10*3/uL (ref 1.4–7.0)
Neutrophils: 33 %
Platelets: 228 10*3/uL (ref 150–450)
RBC: 3.91 x10E6/uL (ref 3.77–5.28)
RDW: 14 % (ref 11.7–15.4)
WBC: 5.7 10*3/uL (ref 3.4–10.8)

## 2022-02-10 LAB — COMPREHENSIVE METABOLIC PANEL
ALT: 12 IU/L (ref 0–32)
AST (SGOT): 23 IU/L (ref 0–40)
Albumin/Globulin Ratio: 1.2 (ref 1.2–2.2)
Albumin: 3.9 g/dL (ref 3.9–4.9)
Alkaline Phosphatase: 185 IU/L — ABNORMAL HIGH (ref 44–121)
BUN / Creatinine Ratio: 5 — ABNORMAL LOW (ref 12–28)
BUN: 5 mg/dL — ABNORMAL LOW (ref 8–27)
Bilirubin, Total: 0.3 mg/dL (ref 0.0–1.2)
CO2: 22 mmol/L (ref 20–29)
Calcium: 8.8 mg/dL (ref 8.7–10.3)
Chloride: 101 mmol/L (ref 96–106)
Creatinine: 0.94 mg/dL (ref 0.57–1.00)
Globulin, Total: 3.2 g/dL (ref 1.5–4.5)
Glucose: 90 mg/dL (ref 70–99)
Potassium: 4.1 mmol/L (ref 3.5–5.2)
Protein, Total: 7.1 g/dL (ref 6.0–8.5)
Sodium: 141 mmol/L (ref 134–144)
eGFR: 69 mL/min/{1.73_m2} (ref >59)

## 2022-02-10 LAB — IRON PROFILE
Iron Saturation: 29 % (ref 15–55)
Iron: 97 ug/dL (ref 27–139)
TIBC: 338 ug/dL (ref 250–450)
UIBC: 241 ug/dL (ref 118–369)

## 2022-02-10 LAB — LIPID PANEL, WITHOUT TOTAL CHOLESTEROL/HDL RATIO, SERUM
Cholesterol: 185 mg/dL (ref 100–199)
HDL: 76 mg/dL (ref >39)
LDL Chol Calculated (NIH): 77 mg/dL (ref 0–99)
Triglycerides: 194 mg/dL — ABNORMAL HIGH (ref 0–149)
VLDL Calculated: 32 mg/dL (ref 5–40)

## 2022-02-10 LAB — VITAMIN B12 AND FOLATE
Folate: >20 ng/mL (ref >3.0)
Vitamin B-12: 749 pg/mL (ref 232–1245)

## 2022-02-10 LAB — THYROID STIMULATING HORMONE (TSH), REFLEX ON ABNORMAL TO FREE T4, SERUM: TSH: 0.58 u[IU]/mL (ref 0.450–4.500)

## 2022-02-10 LAB — HEMOGLOBIN A1C: Hemoglobin A1C: 6.1 % — ABNORMAL HIGH (ref 4.8–5.6)

## 2022-02-10 LAB — FERRITIN: Ferritin: 260 ng/mL — ABNORMAL HIGH (ref 15–150)

## 2022-02-10 NOTE — Telephone Encounter (Signed)
I called pharmacy to see what was going on with the lancets script. The pharmacy tech I spoke with ran it through and the issue is that it is "too soon to fill" per her insurance. It can not be filled until 02/13/22. I tried to call pt back to inform her of this information, I had to leave her a detailed vm with this information and advised to either call pharmacy or [name] if any questions.    Agibson,ma

## 2022-04-06 ENCOUNTER — Other Ambulatory Visit (INDEPENDENT_AMBULATORY_CARE_PROVIDER_SITE_OTHER): Payer: 59

## 2022-04-13 ENCOUNTER — Encounter (INDEPENDENT_AMBULATORY_CARE_PROVIDER_SITE_OTHER): Payer: 59 | Admitting: Family Medicine
